# Patient Record
Sex: Male | Born: 1955 | Race: White | Hispanic: No | Smoking: Former smoker
Health system: Southern US, Community
[De-identification: ages and names within clinical notes are randomized; demographics above are authoritative.]

## PROBLEM LIST (undated history)

## (undated) DIAGNOSIS — E785 Hyperlipidemia, unspecified: Secondary | ICD-10-CM

## (undated) DIAGNOSIS — I639 Cerebral infarction, unspecified: Secondary | ICD-10-CM

## (undated) DIAGNOSIS — G4730 Sleep apnea, unspecified: Secondary | ICD-10-CM

## (undated) DIAGNOSIS — I2510 Atherosclerotic heart disease of native coronary artery without angina pectoris: Secondary | ICD-10-CM

## (undated) DIAGNOSIS — R0600 Dyspnea, unspecified: Secondary | ICD-10-CM

## (undated) DIAGNOSIS — I493 Ventricular premature depolarization: Secondary | ICD-10-CM

## (undated) DIAGNOSIS — I6389 Other cerebral infarction: Secondary | ICD-10-CM

## (undated) DIAGNOSIS — E876 Hypokalemia: Secondary | ICD-10-CM

## (undated) DIAGNOSIS — I5042 Chronic combined systolic (congestive) and diastolic (congestive) heart failure: Secondary | ICD-10-CM

## (undated) DIAGNOSIS — I255 Ischemic cardiomyopathy: Secondary | ICD-10-CM

## (undated) DIAGNOSIS — I209 Angina pectoris, unspecified: Secondary | ICD-10-CM

## (undated) DIAGNOSIS — M1990 Unspecified osteoarthritis, unspecified site: Secondary | ICD-10-CM

## (undated) DIAGNOSIS — I10 Essential (primary) hypertension: Secondary | ICD-10-CM

## (undated) HISTORY — DX: Ventricular premature depolarization: I49.3

## (undated) HISTORY — PX: CORONARY STENT PLACEMENT: SHX1402

## (undated) HISTORY — DX: Other cerebral infarction: I63.89

## (undated) HISTORY — DX: Cerebral infarction, unspecified: I63.9

---

## 2002-03-01 ENCOUNTER — Encounter: Payer: Self-pay | Admitting: *Deleted

## 2002-03-02 ENCOUNTER — Inpatient Hospital Stay (HOSPITAL_COMMUNITY): Admission: EM | Admit: 2002-03-02 | Discharge: 2002-03-04 | Payer: Self-pay | Admitting: Emergency Medicine

## 2002-10-28 ENCOUNTER — Inpatient Hospital Stay (HOSPITAL_COMMUNITY): Admission: AD | Admit: 2002-10-28 | Discharge: 2002-11-03 | Payer: Self-pay | Admitting: Cardiology

## 2002-11-01 ENCOUNTER — Encounter: Payer: Self-pay | Admitting: Cardiology

## 2002-11-03 ENCOUNTER — Encounter: Payer: Self-pay | Admitting: Cardiology

## 2004-07-13 ENCOUNTER — Ambulatory Visit: Payer: Self-pay | Admitting: Cardiology

## 2004-09-01 ENCOUNTER — Ambulatory Visit: Payer: Self-pay

## 2005-01-27 ENCOUNTER — Emergency Department: Payer: Self-pay | Admitting: General Practice

## 2005-01-28 ENCOUNTER — Ambulatory Visit: Payer: Self-pay

## 2005-11-26 ENCOUNTER — Inpatient Hospital Stay (HOSPITAL_COMMUNITY): Admission: EM | Admit: 2005-11-26 | Discharge: 2005-11-27 | Payer: Self-pay | Admitting: Emergency Medicine

## 2005-11-26 ENCOUNTER — Ambulatory Visit: Payer: Self-pay | Admitting: Cardiology

## 2005-12-08 ENCOUNTER — Ambulatory Visit: Payer: Self-pay | Admitting: Cardiology

## 2005-12-10 ENCOUNTER — Ambulatory Visit (HOSPITAL_COMMUNITY): Admission: RE | Admit: 2005-12-10 | Discharge: 2005-12-10 | Payer: Self-pay | Admitting: Cardiovascular Disease

## 2006-02-09 ENCOUNTER — Ambulatory Visit: Payer: Self-pay | Admitting: Cardiology

## 2006-12-07 ENCOUNTER — Inpatient Hospital Stay (HOSPITAL_COMMUNITY): Admission: EM | Admit: 2006-12-07 | Discharge: 2006-12-08 | Payer: Self-pay | Admitting: Emergency Medicine

## 2006-12-07 ENCOUNTER — Ambulatory Visit: Payer: Self-pay | Admitting: Internal Medicine

## 2006-12-29 ENCOUNTER — Ambulatory Visit: Payer: Self-pay | Admitting: Cardiology

## 2008-02-27 ENCOUNTER — Inpatient Hospital Stay: Payer: Self-pay | Admitting: Internal Medicine

## 2008-02-27 ENCOUNTER — Ambulatory Visit: Payer: Self-pay | Admitting: Internal Medicine

## 2008-02-27 ENCOUNTER — Other Ambulatory Visit: Payer: Self-pay

## 2010-04-18 ENCOUNTER — Inpatient Hospital Stay: Payer: Self-pay | Admitting: Internal Medicine

## 2010-05-31 ENCOUNTER — Emergency Department: Payer: Self-pay | Admitting: Emergency Medicine

## 2010-11-17 NOTE — H&P (Signed)
NAMECOPELAND, [name]                 ACCOUNT NO.:  [id number]   MEDICAL RECORD NO.:  [id number]           PATIENT TYPE:   LOCATION:                               FACILITY:  [facility]   PHYSICIAN:  Bevelyn Buckles. Bensimhon, MD     DATE OF BIRTH:   DATE OF ADMISSION:  12/07/2006  DATE OF DISCHARGE:                              HISTORY & PHYSICAL   PRIMARY CARE PHYSICIAN:  Dr. Dewaine Oats in [place], [place].   REASON FOR ADMISSION:  Unstable angina.   HISTORY OF PRESENT ILLNESS:  The patient is a 55 year old male with a  history of known coronary artery disease.  He experienced an inferior  wall myocardial infarction in 2003, and was treated with a drug-eluting  stent.  He re-presented in 2004, with a recurrent myocardial infarction.  The right coronary stent had closed.  Unfortunately this was unable to  be opened up.  There were noted to be good left to right collaterals.  He again experienced recurrent angina in May 2007.  He underwent a  cardiac catheterization at that time, showing the left main of 20% and  the LAD with multiple 30%-40% lesions.  There is a large second diagonal  branch with an 80% lesion.  The circumflex had multiple 50%-60% lesions  in the distal AV groove portion.  There were left to right collaterals  perfusing a totally-occluded right coronary artery.  His ejection  fraction was 44% by Myoview in 2006.  He underwent a percutaneous  transluminal coronary angiography and stenting with a drug-eluting stent  to the diagonal branch.   He was doing fairly well until this past Monday, when he developed some  nausea and diaphoresis in the morning.  He ended up staying in bed all  day.  On Tuesday, he had several episodes of severe arm pain.  He took  nitroglycerin, with significant relief; however, this morning he got up  and once again had arm pain and diaphoresis.  He went to Dr. Maree Krabbe  office an got nitroglycerin and it resolved.  He was brought over here.  He  had recurrent arm pain which again responded to nitroglycerin.  He  continues to have just smoldering arm pain.  An electrocardiogram shows  no significant ST-T wave abnormalities.  He has not been taking his  Plavix recently.   REVIEW OF SYSTEMS:  He notes heavy snoring and poor sleep.  Also has  gout.  Denies any lower extremity edema.  No orthopnea or PND.  No  bright red blood per rectum or melena.  The remainder of the review of  systems is negative except for the HPI.   PAST MEDICAL HISTORY:  1. Coronary artery disease with      a.     Status post previous inferior wall myocardial infarction as       described in the history of present illness.      b.     Status post percutaneous transluminal coronary angiography       and stenting with a drug-eluting stent to the diagonal branch in  May 2007.  2. Hypertension.  3. Hyperlipidemia.  4. Obesity.  5. Tobacco use, ongoing.  6. Gout.  7. History of noncompliance.   CURRENT MEDICATIONS:  1. Aspirin 325 mg q.d.  2. Carvedilol 6.25 mg q.d.   ALLERGIES:  CODEINE, VICODIN AND PENICILLIN.  HE ALSO HAS A CONTRAST  ALLERGY.   SOCIAL HISTORY:  He is married.  He has two kids.  He is a Statistician.  Currently smokes one pack of cigarettes a day, but previously smoked  three packs a day.  He has a history of alcohol abuse, but is no longer  drinking.   FAMILY HISTORY:  Significant for coronary artery disease.  His dad died  in his 84s of a myocardial infarction.  Mother is currently alive at age  64 with multiple problems, including  coronary artery disease.   PHYSICAL EXAMINATION:  GENERAL:  He is in mild distress sitting on the  examination table.  VITAL SIGNS:  Respirations unlabored.  Blood pressure 160/90, heart rate  60.  HEENT:  Normal.  NECK:  Is thick.  Unable to assess jugular venous distention.  Carotids  are 2+ bilaterally without any bruits.  There is no lymphadenopathy or  thyromegaly.  CARDIAC:  PMI is not  palpable.  There is a regular rate and rhythm with  distant heart sounds.  No obvious murmurs.  LUNGS:  Clear.  No wheezes or rales.  ABDOMEN:  Obese, nontender, non-distended.  Unable to palpate any  hepatosplenomegaly or bruits or masses.  Good bowel sounds.  EXTREMITIES:  Warm, with no clubbing, cyanosis or edema.  DP pulses are  2+ bilaterally.  There is no rash.  NEUROLOGIC:  He is alert and oriented x3.  Cranial nerves II-XII  are  intact.  He moves all four extremities without difficulty.   Electrocardiogram shows normal sinus rhythm at a rate of 60.  He does  have mild T-wave inversion in lead I and aVL, which was not on his  previous electrocardiogram of October 24, 2006.   ASSESSMENT/PLAN:  Unstable angina:  The patient's symptoms are very  concerning for unstable angina.  We have given him some nitroglycerin  and put him on oxygen and contacted [facility], and will have him  transported to [facility] for a cardiac  catheterization later today.  We have discussed the risks and benefits  of the cardiac catheterization and he agrees to proceed.  Will give him  300 mg of Plavix prior to transport.      Bevelyn Buckles. Bensimhon, MD  Electronically Signed     DRB/MEDQ  D:  12/07/2006  T:  12/07/2006  Job:  [id number]   cc:   Dewaine Oats, [name]

## 2010-11-17 NOTE — Assessment & Plan Note (Signed)
[facility] OFFICE NOTE   Glen, [name]                          MRN:          [id number]  DATE:12/29/2006                            DOB:          October 01, 1955    [name] returns today after being discharged from the hospital.  He was  admitted on December 07, 2006, by Dr. Gala Romney after presenting with what  sounded like unstable angina.   He ruled out for myocardial infarction.   His cardiac catheterization showed inferior akinesia from his previous  inferior infarct which he had in 2003.  He had a patent stent and a  large diagonal off the LAD.  His circumflex was a moderate sized vessel  with no significant disease.  His right coronary artery was occluded as  before.  Distal vessel was large and collateralized from the left.  His  EF was 45%.   Lisinopril was added for some mild to moderate left ventricular systolic  dysfunction.  He was also sent home on carvedilol 6.25 mg b.i.d.,  aspirin 325 mg a day.  He cannot tolerate Crestor because of aches.   He wants to know if there is anything else he can take.  I suggested  Pravastatin 40, which I prescribed today.   He has had no further chest discomfort.   PHYSICAL EXAMINATION:  VITAL SIGNS:  Blood pressure 138/88, pulse 66 and  regular, weight 246.  HEENT:  Unchanged.  Carotids were full.  No bruits.  No JVD.  LUNGS:  Clear.  HEART:  Nondisplaced PMI.  There is a soft S1, S2.  No gallop or murmur.  ABDOMEN:  Protuberant with good bowel sounds.  Organomegaly was hard to  assess.  EXTREMITIES:  No edema.  Pulses are intact.  Catheterization site is  stable.   ASSESSMENT/PLAN:  I had a long talk with [name] today about importance  of a Stain.  I have placed him on Pravastatin 40 mg nightly.  Will check  lipids and LFTs in 6 weeks.  If we can get his LDL below 100, I will be  happy.  Crestor is a drug intolerance.   If he is doing well, I will see him  back in a year.     Thomas C. Daleen Squibb, MD, [facility]  Electronically Signed    TCW/MedQ  DD: 12/29/2006  DT: 12/29/2006  Job #: [id number]   cc:   Dewaine Oats

## 2010-11-17 NOTE — Cardiovascular Report (Signed)
NAMENATASHA, PAULSON                 ACCOUNT NO.:  [id number]   MEDICAL RECORD NO.:  [id number]          PATIENT TYPE:  INP   LOCATION:  4733                         FACILITY:  [facility]   PHYSICIAN:  Salvadore Farber, MD  DATE OF BIRTH:  Dec 15, 1955   DATE OF PROCEDURE:  12/07/2006  DATE OF DISCHARGE:                            CARDIAC CATHETERIZATION   PROCEDURES:  1. Left heart catheterization.  2. Left ventriculography.  3. Coronary angiography.   INDICATIONS:  Mr. Simonis is a 55 year old gentleman with coronary disease.  He is status post inferior myocardial infarction in 2003 and then a  repeat anterior myocardial infarction in 2004.  The RCA has been  chronically occluded since then with left-to-right collaterals.  He has  a Taxus drug-eluting stent in the proximal portion of the first  diagonal, which was placed in May 2007.   Mr. Mages now presents with 2 days of left arm pain, nausea and  diaphoresis, which has been coming and going.  It improved with  nitroglycerin.  Electrocardiogram has inferolateral T-wave inversions.  He was transferred from the office in [place] to the catheterization  lab for cardiac catheterization.   PROCEDURAL TECHNIQUE:  Informed consent was obtained.  Under 1%  lidocaine local anesthesia, a 5-French sheath was placed in the right  common femoral artery using THE modified Seldinger technique.  Diagnostic angiography and ventriculography were performed using JL-4,  JR-4, and pigtail catheters.  The patient tolerated the procedure well  and was transferred to the holding room in stable condition.  Sheaths  will be removed there.   COMPLICATIONS:  None.   FINDINGS:  1. LV:  167/21/29.  EF approximately 45% with inferior akinesis.  2. No aortic stenosis or mitral regurgitation.  3. Left main:  Angiographically normal.  4. LAD:  Moderate-sized vessel giving rise to a single large diagonal.      The diagonal has a previously-placed stent in its  proximal segment.      There is no in-stent restenosis.  5. Circumflex:  Moderate-sized vessel giving rise to two marginals.      It has only minor luminal irregularities.  6. RCA:  The vessel is occluded proximally.  The distal vessel is      fairly large and is collateralized from the left.   IMPRESSION/PLAN:  No change compared with 2007.  The right coronary  artery remains collateralized from the left.  However, it is not clear  that any this territory is viable.  The diagonal stent remains widely  patent.  Ejection fraction is approximately 45%.   Given his impaired left ventricular systolic function, will add ACE  inhibitor.      Salvadore Farber, MD  Electronically Signed     WED/MEDQ  D:  12/07/2006  T:  12/08/2006  Job:  [id number]   cc:   Bevelyn Buckles. Bensimhon, MD  Dewaine Oats

## 2010-11-17 NOTE — Discharge Summary (Signed)
[name], [name]                 ACCOUNT NO.:  [id number]   MEDICAL RECORD NO.:  [id number]          PATIENT TYPE:  INP   LOCATION:  4733                         FACILITY:  [facility]   PHYSICIAN:  [name], MDDATE OF BIRTH:  Aug 20, 1955   DATE OF ADMISSION:  12/07/2006  DATE OF DISCHARGE:  12/08/2006                               DISCHARGE SUMMARY   OTHER PHYSICIANS:  Primary cardiologist Dr. Valera [name].  Primary care [employer], Dr. Dewaine [name] in [place].   DISCHARGE DIAGNOSIS:  Chest pain.   SECONDARY DIAGNOSES:  1. Coronary artery disease.      a.     Status post previous inferior wall myocardial infarction in       2003 with drug-eluting stent placement to the right coronary       artery.      b.     Status post percutaneous transluminal coronary angioplasty       and drug-eluting stent placement to the diagonal branch in May       2007.  At that time the right coronary artery was noted be       occluded with left-to-right collaterals.  2. Hypertension.  3. Hyperlipidemia.  4. Obesity.  5. Ongoing tobacco abuse.  6. Gout.  7. History of noncompliance.  8. Question metabolic syndrome.   ALLERGIES:  CODEINE, VICODIN, PENICILLIN, CONTRAST.   PROCEDURE:  Left heart rate catheterization.   HISTORY OF PRESENT ILLNESS:  A 55 year old Caucasian male with the above  problem list who was in his usual state of health until earlier this  week when he developed nausea and diaphoresis in the morning.  He felt  ill or generalized malaise for a good part of the day and then the  following day developed several episodes of severe arm pain unrelieved  by nitroglycerin.  On the morning of admission, December 07, 2006, he had  recurrent arm pain with diaphoresis resolved by nitroglycerin in Dr.  Maree [name] office.  He was then seen by Dr. Arvilla [name] in clinic and  ECG showed no acute changes.  He was treated with Plavix in clinic and  transferred to [facility] for further  evaluation.   HOSPITAL COURSE:  The patient underwent left heart cardiac  catheterization on December 07, 2006 revealing an occluded right coronary  artery which was old with left-to-right collaterals.  The stent in the  diagonal was widely patent and otherwise he had nonobstructive disease.  His EF was 45% with inferior akinesis.  Given his low EF with wall  motion abnormalities he was initiated on ACE inhibitor therapy and his  Coreg has been maintained.  He had been reinitiated on statin therapy  which he previously came off of because his prescription ran out.  He  has been counseled on the importance of smoking cessation and will be  discharged home today in satisfactory condition.   DISCHARGE LABORATORY:  Hemoglobin 14.1, hematocrit 40.5, WBC 13.6,  platelets 221,000.  Sodium 137, potassium 3.9, chloride 105, CO2 27, BUN  12, creatinine 0.91, glucose 193, calcium 8.8.  TSH pending.  DISPOSITION:  The patient is being discharged home today in good  condition.   FOLLOW-UP APPOINTMENTS:  He is to follow-up with Dr. Valera [name] in our  [facility] on December 29, 2006 at 10:00 a.m..  He will follow up Dr.  Dewaine [name] as previously scheduled.   DISCHARGE MEDICATIONS:  1. Aspirin 81 mg daily.  2. Crestor 10 mg daily.  3. Lisinopril 10 mg daily.  4. Nitroglycerin 0.4 mg sublingual p.r.n. chest pain.  5. Coreg 6.25 mg two times a day.   OUTSTANDING LAB STUDIES:  TSH is pending.   Duration discharge encounter 40 minutes including physician time.      [name], ANP      Glen Buckles. Bensimhon, MD  Electronically Signed    CB/MEDQ  D:  12/08/2006  T:  12/08/2006  Job:  [id number]   cc:   [name]

## 2010-11-20 NOTE — Cardiovascular Report (Signed)
NAMELETICIA, MCDIARMID NO.:  [id number]   MEDICAL RECORD NO.:  [id number]          PATIENT TYPE:  INP   LOCATION:  1825                         FACILITY:  [facility]   PHYSICIAN:  Charlies Constable, [name] [facility] DATE OF BIRTH:  08-Mar-1956   DATE OF PROCEDURE:  11/26/2005  DATE OF DISCHARGE:                              CARDIAC CATHETERIZATION   CLINICAL HISTORY:  Mr. Prevo is 55 years old and has known coronary disease.  He is a Naval architect.  He had a chronic total occlusion opened in 2003 but  subsequently occluded this and had an unsuccessful attempt repeat  intervention.  He was admitted today with a 3-day history of progressive  chest pain thought to represent unstable angina.  He was studied by Dr.  Eden Emms today and found to have a tight lesion in the large diagonal branch  of the LAD.  The right coronary was chronically occluded.  His LV function  showed inferior wall akinesis.  We elected to do an intervention on the  diagonal branch of the LAD.   PROCEDURE:  The procedure was performed via the right femoral artery using a  6-French Q-4 guiding catheter with side holes.  We had initially started  with a Prowater wire but then switched to a PT-2 light support wire because  the lesion was somewhat difficult to cross.  We were able to cross the  lesion, and we predilated with a 2.25 x 20 mm Maverick, performing two  inflations up to 10 atmospheres for 30 seconds.  We then deployed a 2.75 x  24 mm TAXUS stent, deploying this with one inflation of 14 atmospheres for  30 seconds.  We then post dilated with a 3.0 x 20 mm Quantum Maverick,  performing two inflations up to 16 atmospheres for 30 seconds.  Final  diagnostic study was then performed through the guiding catheter.  The  patient tolerated the procedure well and left the laboratory in satisfactory  condition.   RESULTS:  Initially stenosis in the proximal portion of the large diagonal  branch was estimated at 90%.   There was a side branch located at the lesion,  but this was not too large a branch.  Following stenting, the stenosis  improved from 90% to 0%.   CONCLUSION:  Successful PCI of the diagonal branch stenosis using a TAXUS  drug-eluting stent with improvement in narrowing from 90% to 0%.   DISPOSITION:  The patient returned to the recovery room for further  observation.  Will plan discharge tomorrow.  The patient is to remain on  long-term Plavix.           ______________________________  Charlies Constable, [name] [facility]     BB/MEDQ  D:  11/26/2005  T:  11/27/2005  Job:  [id number]   cc:   Vicenta Dunning, [name], Lum Babe C. Wall, [name]  [address]  [address]  [place]  [place] [address]   Charlton Haws, [name]  [address]  [address]  [place]  [place] [address]   Cardiopulmonary Lab

## 2010-11-20 NOTE — Cardiovascular Report (Signed)
   [name], [name]                             ACCOUNT NO.:  [id number]   MEDICAL RECORD NO.:  [id number]                   PATIENT TYPE:  INP   LOCATION:  2901                                 FACILITY:  [facility]   PHYSICIAN:  Noralyn Pick. Eden Emms, [name] [facility]           DATE OF BIRTH:  1956/05/07   DATE OF PROCEDURE:  DATE OF DISCHARGE:                              CARDIAC CATHETERIZATION   PROCEDURE:  Coronary arteriography.   INDICATION:  Out-of-hospital inferior wall myocardial infarction in the last  36-48 hours.   DESCRIPTION OF PROCEDURE:  Standard catheterization was done from the right  femoral artery.   RESULTS:  Left main coronary artery had 20% discrete stenosis.   Left anterior descending artery had 30% multiple discrete lesions  proximally. There was diffuse 50-60% disease distally. The first diagonal  branch had a 70% tubular lesion.   The circumflex coronary artery had 50% multiple discrete lesions in the AV  groove branch.   The right coronary artery was 100% occluded at its mid vessel. The occlusion  was just after an RV branch. However, there was excellent left to right  collaterals mostly to the distal LAD and septal perforators. These filled  the posterolateral branch PDA and the distal right coronary artery.   RIGHT ANTERIOR OBLIQUE VENTRICULOGRAPHY:  RAO ventriculography revealed  inferobasilar hypokinesis.  Ejection fraction 50-55%. There was no MR.   Aortic pressure was 123/71, LV pressure was 128/24.   IMPRESSION/PLAN:  The films will be reviewed with Dr. Juanda Chance. However, since  the anatomy is favorable and there is only a short segment of unvisualized  native right coronary artery, I suspect he will want to intervene and open  the total occlusion of the right coronary artery. The patient has not had  much left ventricular dysfunction due to the good collateralization and  again, I suspect Dr. Juanda Chance will want to intervene the internal right  coronary  artery.                                                        Noralyn Pick. Eden Emms, [name] [facility]    PCN/MEDQ  D:  03/02/2002  T:  03/04/2002  Job:  [id number]   cc:   _________ Royetta Crochet, [name]  Google C. Wall, [name] [facility]

## 2010-11-20 NOTE — Cardiovascular Report (Signed)
[name], [name]                             ACCOUNT NO.:  [id number]   MEDICAL RECORD NO.:  [id number]                   PATIENT TYPE:  INP   LOCATION:  2931                                 FACILITY:  [facility]   PHYSICIAN:  Salvadore Farber, [name]             DATE OF BIRTH:  1955/10/22   DATE OF PROCEDURE:  DATE OF DISCHARGE:                              CARDIAC CATHETERIZATION   PROCEDURE:  Left heart catheterization, left ventriculography, coronary  angiography, unsuccessful percutaneous intervention of the right coronary  artery.   INDICATIONS FOR PROCEDURE:  The patient is a 55 year old gentleman status  post stenting of his RCA in the setting of myocardial infarction in August  2003.  He now re-presents with acute myocardial infarction, this time  complicated by ventricular fibrillatory arrest.  He was seen at [facility] where he was treated with aspirin, Plavix, heparin,  ReoPro, amiodarone, and Lopressor, and transferred for cardiac  catheterization with an eye to primary angioplasty.   En route, the patient described a prior contrast allergy.  He stated that  this had occurred during his prior catheterization at [facility].  This was  documented in neither the discharge summary nor the catheterization report;  nonetheless, he was premedicated with 125 mg of Solu-Medrol, 50 mg of  Benadryl, and 50 mg of Zantac.   PROCEDURAL TECHNIQUE:  Informed consent was obtained.  Under 1% lidocaine  local anesthesia, a 7-French sheath was placed in the right femoral artery  using the modified Seldinger technique.  Diagnostic cineangiography and  ventriculography were performed using JL4, JR4, and pigtail catheters.  The  case then proceeded to intervention.   Additional heparin was given to achieve an ACT of greater than 200 seconds.  ReoPro was continued.  A 7-French JR4 guide was advanced over a wire and  engaged in the ostium of the right coronary artery.  There  was difficulty  passing a wire beyond the stenosis.  A BMW wire could not be passed.  Eventually, a luge wire was passed into the distal PLV; however, I was  unable to pass a 3.0-mm Quantum beyond the proximal margin of the previously  placed stent.  It felt as if the wire were under a stent strut; therefore, a  luge wire was passed, this time, into the PDA.  This passed with ease.  A  2.0 x 9-mm Quantum balloon was then advanced over the wire.  This was able  to be advanced into the distal vessel; however, again, it hung up at the  proximal marginal stent as if the wire were under a stent strut.  TIMI-2  flow was transiently established complicated by transient bradycardia which  responded to atropine.  Because both wires were under stent struts, they  were withdrawn.  Subsequent attempts using multiple wires including PT  Graphix, Whisper, Cross-It 100, Cross-It 200 wires were all unsuccessful in  remaining free of the stent strut.  I attempted to prolapse several wires  across the proximal margin of the stent but was unable to do so.  They would  simply not enter the proximal portion of the stent.  As no equipment could  be passed beyond the proximal margin of the stent, the procedure was  abandoned.  Final angiogram demonstrated recurrent complete occlusion with  TIMI-0 flow.    IMPRESSION AND RECOMMENDATIONS:  Unsuccessful percutaneous intervention of  the culprit lesion of the right coronary artery.  Will plan medical therapy  for the myocardial infarction with aspirin, beta blocker (not Lopressor),  statin, and ACE inhibitor.  Intravenous nitroglycerin will be continued to  optimize collateral flow.  Careful attention will be made to secondary  prevention with risk factor modification.                                                Salvadore Farber, [name]    WED/MEDQ  D:  10/28/2002  T:  10/30/2002  Job:  [id number]   cc:   Thomas C. Wall, [name]   Dewaine Oats  [address]  [place]  [place] [address]  Fax: [phone]

## 2010-11-20 NOTE — Cardiovascular Report (Signed)
[name], [name]                 ACCOUNT NO.:  [id number]   MEDICAL RECORD NO.:  [id number]          PATIENT TYPE:  INP   LOCATION:  1825                         FACILITY:  [facility]   PHYSICIAN:  Charlton Haws, [name]     DATE OF BIRTH:  [birth year]   DATE OF PROCEDURE:  11/26/2005  DATE OF DISCHARGE:                              CARDIAC CATHETERIZATION   PROCEDURE:  Arteriography.   INDICATIONS:  Recurrent angina.   The patient is status post previous stenting of the right coronary artery  for total occlusion. Unfortunately, this stent reoccluded and was unable to  be opened.  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   Catheterization with 6-French catheter from right femoral artery.   Left main coronary artery had 20% distal stenosis.   Left anterior descending artery had 30-40% multiple discrete lesions in the  proximal and mid vessel.   There is a large second diagonal branch with an 80% lesion. The lesion  occurred right at the bifurcation point   This lesion appears to have progressed since previous.   Circumflex coronary was large but not dominant.   The proximal circumflex coronary artery was normal.  The distal AV groove  branch had 50-60% multiple discrete lesions.   There was some left-to-right collaterals to the right coronary artery which  were not as well established as previous   Right coronary was subtotally occluded proximally at the previous stent  site.   IMPRESSION:  Films reviewed with Dr. Juanda Chance. He will proceed with  angioplasty and stenting of the second diagonal branch.           ______________________________  Charlton Haws, [name]     PN/MEDQ  D:  11/26/2005  T:  11/27/2005  Job:  [id number]

## 2010-11-20 NOTE — Cardiovascular Report (Signed)
[name], [name]                             ACCOUNT NO.:  [id number]   MEDICAL RECORD NO.:  [id number]                   PATIENT TYPE:  INP   LOCATION:  2901                                 FACILITY:  [facility]   PHYSICIAN:  Everardo Beals. Juanda Chance, [name] [facility]           DATE OF BIRTH:  [birth year]   DATE OF PROCEDURE:  DATE OF DISCHARGE:                              CARDIAC CATHETERIZATION   PROCEDURE PERFORMED:  Cardiac catheterization.   CLINICAL HISTORY:  The patient is 55 years old and has no prior history of  known heart disease and was admitted with chest pain and ECGs and enzymes  consistent with an inferior MI. The pain resolved by the time he arrived and  so he was not taken to the lab urgently. He was studied earlier by Dr.  Eden Emms today and found to have a totally occluded right coronary artery with  fairly good collateral flow. His peak CKs and MBs were 694/73. We made a  decision to proceed with intervention of the right coronary artery.   DESCRIPTION OF PROCEDURE:  The procedure was performed via the right femoral  artery using an arterial sheath and 6 [place] JR4 guiding catheter with side  holes. The patient was given weight-adjusted heparin to prolong the ACT to  greater than 200 seconds and had been on an Integrilin drip. We first tried  to cross the totally occluded right coronary artery with a Hi-Torque Floppy  wire. This was unsuccessful and ___________ Graphix PT and we were able to  cross the lesion. We dilated with a balloon but this did not establish flow.  For this reason, we passed a 2.5 x 20 mm OpenSail across the lesion and  removed the wire and injected contrast distally to document we were in the  lumen. Once we documented we were in the lumen, then we replaced the wire  and inflated the balloon for two inflations up to 7 atmospheres for 23  seconds.  We then deployed a 3.0 x 33 mm Cypher stent deploying this with  one inflation up to 15 atmospheres for 56  seconds. We then post-dilated with  a 3.5 x 20 mm Quantum Maverick performing three inflations up to 14  atmospheres for 30 seconds. Repeat diagnostic studies were then performed  through the guiding catheter. The patient tolerated the procedure well and  left the laboratory in satisfactory condition.   RESULTS:  Initially the right coronary artery was totally occluded in its  proximal to midportion. Following stenting, this improved to 10% and the  flow improved from TIMI-0 to TIMI-3 flow.   CONCLUSION:  Successful stenting of the recently totally occluded mid right  coronary artery with improvement in percent diameter narrowing to  improvement in percent diameter narrowing from 100% to 10% and improvement  in the flow from TIMI-0 to TIMI-3 flow.   DISPOSITION:  The patient was returned to the postangioplasty  unit for  further observation.                                                    [name], [name] [facility]    BRB/MEDQ  D:  03/02/2002  T:  03/04/2002  Job:  [id number]

## 2010-11-20 NOTE — H&P (Signed)
NAMEBABACAR, HAYCRAFT                 ACCOUNT NO.:  [id number]   MEDICAL RECORD NO.:  [id number]          PATIENT TYPE:  INP   LOCATION:  1825                         FACILITY:  [facility]   PHYSICIAN:  Thomas C. Wall, [name]   DATE OF BIRTH:  12/29/1955   DATE OF ADMISSION:  11/26/2005  DATE OF DISCHARGE:                                HISTORY & PHYSICAL   ADDENDUM:  Mr. Vanvranken was to be admitted from our office to the hospital  today.  He was supposed to go by EMS transport.  The patient asked to use  the bathroom and wanted to go downstairs to lock up his truck.  I asked that  we have one of our assistants go with him to make sure that he was okay when  he went to go lock up his truck; however, he left the building without  assistance and left the premises.  We tried to reach him by telephone but  were unsuccessful.  I did call the emergency room later and learned that the  patient had shown up to the emergency room.  His paperwork was sent over to  the hospital, and is actually on his way to the cath lab at the time of this  dictation.      Tereso Newcomer, P.A.      Thomas C. Wall, [name]  Electronically Signed    SW/MEDQ  D:  11/26/2005  T:  11/26/2005  Job:  [id number]

## 2010-11-20 NOTE — H&P (Signed)
NAMEJACQUESE, HACKMAN                 ACCOUNT NO.:  [id number]   MEDICAL RECORD NO.:  [id number]          PATIENT TYPE:  EMS   LOCATION:  MAJO                         FACILITY:  [facility]   PHYSICIAN:  Thomas C. Wall, [name]   DATE OF BIRTH:  August 25, 1955   DATE OF ADMISSION:  11/26/2005  DATE OF DISCHARGE:                                HISTORY & PHYSICAL   PRIMARY CARE PHYSICIAN:  Dr[place] Dewaine [place]   PRIMARY CARDIOLOGIST:  Dr. Valera Castle   CHIEF COMPLAINT:  Left arm pain, headaches, dizziness, shortness of breath.   HISTORY OF PRESENT ILLNESS:  [name] is a very pleasant 55 year old male  patient with a history of coronary disease status post out-of-hospital  inferior MI in 2003 treated with a CYPHER stent to the RCA and inferior ST  elevation myocardial infarction in April of 2004 complicated by ventricular  fibrillation arrest secondary to restenosis of the RCA.  Attempt at PCI of  the RCA in 2004 was unsuccessful.  Medical therapy was recommended.  The  patient stopped his cardiac medications probably a year or two ago.  He,  over the last three days, has noted some left arm aching.  This is similar  to his previous myocardial infarction pain.  Denies any chest pain.  He did  not have any chest pain with his myocardial infarction.  He has had dyspnea  exertion with just walking up hills or going up steps.  This is new for him.  He has been a little more sweaty than usual.  Denies any nausea or vomiting.  Denies any syncope but he has been lightheaded, especially with certain  changes in head positioning.  He has had some headaches as well.  He did  have a couple of episodes of arm pain in the office today.  We plan to admit  him to the hospital for further evaluation and cardiac catheterization.   PAST MEDICAL HISTORY:  As noted above, is significant for coronary artery  disease.  He had an inferior MI in 2003 treated with a drug-eluting stent to  the RCA and  then re-occlusion of the RCA resulting in an ST elevation  myocardial infarction and ventricular fibrillation arrest.  PCI was  unsuccessful to the RCA.  Residual CAD at time of his catheterization in  2004 showed a 40% diagonal lesion, distal LAD lesion of 50%, circumflex  lesion of 50%.  He had a non-ischemic Myoview in July 2006.  His EF has been  recorded as 44% by Myoview in 2006.  EF by echocardiogram in 2004 was 55-  65%.  He has a history of hypertension, hypercholesterolemia, gout.  He has  an IV dye allergy.   MEDICATIONS:  Chantix 1 mg two tablets a day.   ALLERGIES:  PENICILLIN, CODEINE, IV DYE, and ACE INHIBITORS.   SOCIAL HISTORY:  The patient is married.  He is a Statistician by trade.  He  has smoked cigarettes for about 30 years at three packs per day for a 90-  pack-year history.  He is down  to just a few cigarettes a day now.   FAMILY HISTORY:  Significant for coronary artery disease.  His dad died in  his 3s of a myocardial infarction.   REVIEW OF SYSTEMS:  Please see HPI.  Denies any fevers, chills, melena.  He  has had some bright red blood per rectum from his hemorrhoids.  This has not  changed.  Denies any claudication.  Denies any symptoms of amaurosis fugax  or TIAs.  He has had headaches noted pretty much frontal and posterior.  Positive blurry vision.  Rest of review of systems are negative.   PHYSICAL EXAMINATION:  GENERAL:  Well-nourished, well-developed __________.  VITAL SIGNS:  Blood pressure 132/82, pulse 76, weight 207 pounds.  HEENT:  Head normocephalic, atraumatic.  Eyes:  PERRLA.  EOMI.  Sclerae  clear.  Oropharynx pink without exudate.  NECK:  Without lymphadenopathy.  ENDOCRINE:  Without thyromegaly.  Carotids without bruits bilaterally.  CARDIAC:  S1, S2.  Regular rate and rhythm with 1/6 systolic ejection murmur  heard best right upper sternal border.  LUNGS:  Clear to auscultation bilaterally without wheezing, rhonchi, or  rales.   ABDOMEN:  Soft, nontender.  Normoactive bowel sounds.  No organomegaly.  EXTREMITIES:  Without edema.  Femoral pulses are 2+ bilaterally without  bruits.  Dorsalis pedis and posterior tibialis pulses 2+ bilaterally without  bruits.  NEUROLOGIC:  Nonfocal.  SKIN:  Warm and dry.   Electrocardiogram reveals sinus rhythm with a heart rate of 70, left axis  deviation, inferior Q-waves, T-wave inversions in 1 and aVL, in V5 and 6  which is new since January of 2006.   IMPRESSION:  1.  Unstable angina pectoris.  2.  Coronary artery disease status post inferior myocardial infarction 2003      treated with drug-eluting stent to the right coronary artery with      recurrent ST elevation myocardial infarction 2004 with unsuccessful PCI      of the right coronary artery.      1.  Residual coronary artery disease as noted above.  3.  Ejection fraction 44% by Myoview 2006.  4.  Untreated hypertension.  5.  Untreated dyslipidemia.  6.  Gout.  7.  IV dye allergy.  8.  Noncompliance.  9.  Tobacco abuse.  10. Family history of coronary disease.   PLAN:  The patient was also seen by Dr. Daleen Squibb.  We plan to admit him to the  hospital and treat him with aspirin, heparin, beta blocker, nitroglycerin,  and initiate a Statin.  We have alerted the catheterization laboratory and  he will be taken today.  Will check his cardiac enzymes and pre treat him  for his IV dye allergy.      Tereso Newcomer, P.A.      Thomas C. Wall, [name]  Electronically Signed    SW/MEDQ  D:  11/26/2005  T:  11/26/2005  Job:  [id number]   cc:   Dewaine Oats  Fax: [phone]

## 2010-11-20 NOTE — H&P (Signed)
[name], [name]                             ACCOUNT NO.:  [id number]   MEDICAL RECORD NO.:  [id number]                   PATIENT TYPE:  EMS   LOCATION:  MAJO                                 FACILITY:  [facility]   PHYSICIAN:  Thomas C. Wall, [name] [facility]            DATE OF BIRTH:  [birth year]   DATE OF ADMISSION:  03/01/2002  DATE OF DISCHARGE:                                HISTORY & PHYSICAL   CHIEF COMPLAINT:  Chest pressure and aching and numbness and tingling in my  arm yesterday evening and again this morning.  I went to [facility] but decided to leave and come here for treatment.   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  He went home and took an aspirin.  He laid in bed most of the evening and  continued to hurt.  He said he was afraid to fall asleep.   He awoke this morning feeling better.  On the way to work, he got sweaty,  clammy, and had the chest pressure once again.  He admitted himself to  [facility] after visiting Urgent Care and was found to have  ST changes inferiorly, as well as T-wave inversion.  His initial CK was 694  with an MB of 72.6 and troponin-I of 15.8.  Basic metabolic panel and  hemoglobin and platelet count were normal except for a glucose of 116, which  was nonfasting.   He received IV nitroglycerin, aspirin, Lovenox, and Integrilin at [facility].  He has not had any Integrilin in about seven hours.   He now presents to the emergency room at [facility] with the same  complaints.  When he arrived, he was having chest discomfort that was  partially relieved with nitroglycerin.  He is now almost pain free on IV  nitroglycerin and heparin.  We  are getting ready to start Integrilin.   ALLERGIES:  He is intolerant to CODEINE and PENICILLIN.   MEDICATIONS:  His only medication prior to admission was Indocin p.r.n. for  gout.   PAST MEDICAL HISTORY:  His only other medical problem is gout.   PAST SURGICAL HISTORY:  He has had some right eye surgery in the past.   FAMILY HISTORY:  Positive for coronary disease in his father.   SOCIAL HISTORY:  He smokes two packs a day and has for years.  He does not  drink or use drugs.   REVIEW OF SYSTEMS:  Review of systems, other than the HPI, is  noncontributory.   PHYSICAL EXAMINATION:  VITAL SIGNS:  His blood pressure was 110/70, his  pulse is  74 and regular.  O2 saturations are normal on 2 L of nasal cannula.  His respiratory rate is 20 and unlabored.  GENERAL:  He is anxious appearing.  SKIN:  Warm and dry.  HEENT:  Unremarkable.  NECK:  Carotid upstrokes are equal bilaterally without bruits.  There is no  JVD.  Thyroid is not enlarged.  LUNGS:  Clear to auscultation and percussion.  HEART:  Regular rate and rhythm without gallop or murmur.  ABDOMEN:  Protuberant.  Good bowel sounds.  There is no epigastric bruit.  There is no hepatomegaly.  EXTREMITIES:  No cyanosis, clubbing, or edema.  Pulses were brisk  bilaterally.  NEUROLOGIC:  Grossly intact.   LABORATORY DATA:  Chest x-ray shows no acute cardiopulmonary disease.  EKG  here shows small Q's in III and aVF with ST segment changes with T-wave  inversion.  These are identical to those at [facility].   ASSESSMENT:  1. Out of hospital inferior wall infarction.  2. Heavy tobacco use.  3. Obesity.  4. Unknown lipid status.  5. Family history of coronary disease.  6. History of gout.  7. CODEINE and PENICILLIN allergies.   PLAN:  1. Admit to the coronary care unit or transitional care unit.  2. Intravenous nitroglycerin, heparin, and Integrilin.  3. Beta-blockers.  4. Aspirin.  5. Serial CPK's and  MB's.  6. TSH and lipid panel.  7. Cardiac catheterization tomorrow.   I have discussed this with he and his wife.  They understand the plans and  agree to proceed.                                                 Thomas C. Daleen Squibb, [name] [facility]    TCW/MEDQ  D:  03/02/2002  T:  03/04/2002  Job:  [id number]   cc:   Dewaine Oats

## 2010-11-20 NOTE — Discharge Summary (Signed)
NAMEWENDY, [name]                             ACCOUNT NO.:  [id number]   MEDICAL RECORD NO.:  [id number]                   PATIENT TYPE:  INP   LOCATION:  2034                                 FACILITY:  [facility]   PHYSICIAN:  Jesse Sans. Wall, [name] [facility]            DATE OF BIRTH:  October 20, 1955   DATE OF ADMISSION:  03/02/2002  DATE OF DISCHARGE:  03/04/2002                           DISCHARGE SUMMARY - REFERRING   PROCEDURE:  1. Cardiac catheterization.  2. Coronary arteriogram.  3. Left ventriculogram.  4. Percutaneous transluminal coronary angioplasty and stent of one vessel.   HOSPITAL COURSE:  The patient is a 55 year old male with no known history of  coronary artery disease who went to his family physician's office for  substernal chest pain on March 01, 2002.  Dr. Arlana Pouch felt that the patient  was having symptoms consistent with unstable anginal pain and the patient  was transported to [facility].  There his enzymes were  elevated consistent with MI and his EKG showed inferior T wave changes.  It  was felt that he had had an out-of-hospital MI.  He was admitted to [facility]; however, the patient left AMA on March 01, 2002.  He presented to  the [facility] Emergency Room on March 01, 2002, at approximately 11 p.m.  There he was seen by [facility] Card[facility] and admitted for further evaluation  and treatment.   He was admitted to CCU and scheduled for cardiac catheterization which was  performed on March 02, 2002.  A cardiac catheterization showed a left main  20% stenosis, an LAD 30% proximal and 50-60% diffuse distal stenosis.  The  first diagonal had a 70% lesion.  The circumflex had a 50% stenosis in the  AV groove.  The RCA was totaled in the mid portion with left-to-right  collaterals.  His left ventriculogram showed inferobasal hypokinesis with an  EF of 50-55% and no MR.  The situation was discussed between Dr. Eden Emms and  Dr. Juanda Chance and it was  decided that percutaneous intervention on the RCA was  indicated.   The patient had PTCA and CYPHER stent to his RCA reducing the stenosis from  100% to less than 10% with TIMI-3 flow.  He tolerated the procedure well and  the sheath was removed without difficulty.   He had no further episodes of chest pain during the course of his hospital  stay and was ambulating well.  He was seen by cardiac rehab for risk factor  reduction as well as the use of nitroglycerin and calling 911 were among the  things discussed.  The patient stated that he would start a walking program  once he went home.   The patient also has a long history of tobacco use and smoking cessation was  discussed.  The patient appeared motivated to quit and was started on  Wellbutrin to assist in this.  He stated that because he was self-pay,  outpatient smoking cessation programs were not an option for him.   The patient had some financial issues relating to medications and a case  management consult was called.  A Plavix program was instituted and he was  also to get some medications from the hospital itself.  He is to follow up  with a clinic in [place] to see if any other medication help is available  to him.  He was encouraged to contact us if he was not going to be able to  afford his medications and he was also advised that it would be harmful to  him to quit taking them without letting us know.   The patient's white count was elevated on March 03, 2002, at 17,000.  He  had had an elevated white count at 12,500 on August 28 but this was more  elevated; however, he was afebrile, had no difficulties with urination, and  his chest x-ray was clear.  He was afebrile.  No source of infection was  located.  This was considered secondary to MI and outpatient followup was  adequate.   The patient was ambulating without difficulty or shortness of breath on  March 04, 2002.  He was evaluated by Dr. Antoine Poche and  considered stable for  discharge.   LABORATORY DATA:  Hemoglobin 14.3, hematocrit 41.1, WBC 17.4, platelets 233.  Sodium 141, potassium 3.9, CO2 108, carbon dioxide 23, BUN 11, creatinine  0.9, glucose 173.  Total cholesterol 148, triglycerides 158, HDL 41, LDL 75.  LFTs within normal limits except albumin low at 2.8 and SGOT minimally  elevated at 42.  CK-MB peak at [facility] was 442/41.6 with a troponin-I of  5.02.   Chest x-ray:  No report available at this time from [place] and no report  available at [facility] although one was ordered in the emergency room.  If  no report is available in followup, obtain chest x-ray at offices.   CONDITION ON DISCHARGE:  Improved.   DISCHARGE DIAGNOSES:  1. Acute inferior myocardial infarction, out-of-hospital, status post     percutaneous transluminal coronary angioplasty and stent to the right     coronary artery with CYPHER stent this admission.  2. Residual disease in the left anterior descending artery and circumflex of     50% with first diagonal 70% stenosis.  3. Preserved left ventricular function with an ejection fraction of 50-55%     and no mitral regurgitation by catheterization this admission.  4. History of tobacco use, greater than 60-pack years.  5. Leukocytosis, no source of infection found, followup as an outpatient.  6. Hypertension.  7. Dyslipidemia with hypertriglyceridemia.  8. History of gout.  9. Mild obesity.  10.      Status post tonsillectomy.   DISCHARGE INSTRUCTIONS:  His activity level is to include no driving for a  week and no work or sexual or strenuous activity until cleared by [name]  He  is to stick to a low-fat and low-salt diet.  He is to call the office with  problems with the catheterization site.  He is to get a CBC at his next  office visit.  He is to get lipid profile and liver tests in six weeks.  He is to follow up with Dr. Daleen Squibb in [place] and the office will call.  He is  to follow up with  Dr. Dewaine Oats in [place] as scheduled.   DISCHARGE MEDICATIONS:  1.  Lopressor 50 mg one-half tab b.i.d.  2. Coated aspirin 325 mg q.d.  3. Plavix 75 mg q.d.  4.     Zocor 20 mg q.d.  5. Wellbutrin 150 mg b.i.d.  6. Nitroglycerin 0.4 mg p.r.n.       Lavella Hammock, P.A. [facility]                  Thomas C. Wall, [name] [facility]    RG/MEDQ  D:  03/04/2002  T:  03/05/2002  Job:  [id number]   cc:   Concepcion Elk Wall, [name] [facility]

## 2010-11-20 NOTE — Assessment & Plan Note (Signed)
[facility]                              CARDIOLOGY OFFICE NOTE   TOREZ, BEAUREGARD                          MRN:          [id number]  DATE:02/09/2006                            DOB:          August 05, 1955    Mr. Ritchey returns today for  further management of his coronary artery  disease.  Please see  the note from 12/08/2005.   He is having no angina.  He cannot take Vytorin with muscle aches.  He also  had the same problem with Zocor.   He took Crestor at one point but does not remember having a problem with it.  He is not sure why he stopped it.   He is currently on Plavix 75 mg a day, Chantix 1 mg p.o. b.i.d., enteric  coated aspirin 325 mg a day and Fluoxetine 20 mg a day.   He has cut down to 3 cigarettes a day from 3 packs!   PHYSICAL EXAMINATION:  VITAL SIGNS:  His blood pressure today  is 129/77,  pulse 72 and regular, his weight is 239.  Carotids are full without bruits.  There is no JVD.  LUNGS:  Clear.  HEART:  Regular rate and rhythm.  ABDOMEN:  Soft with good bowel sounds.  EXTREMITIES:  No edema.  Pulses are present.   ASSESSMENT AND PLAN:  Mr. Linck is doing well.   PLAN:  1.  Crestor 10 mg a day with followup lipids in six weeks and LFTs.  2.  Follow up with me in November.  3.  Continue to try to stop smoking.                               Thomas C. Daleen Squibb, MD, [facility]    TCW/MedQ  DD:  02/09/2006  DT:  02/09/2006  Job #:  [id number]   cc:   Dewaine Oats

## 2010-11-20 NOTE — H&P (Signed)
NAMEHESTER, FORGET                             ACCOUNT NO.:  [id number]   MEDICAL RECORD NO.:  [id number]                   PATIENT TYPE:  INP   LOCATION:  2931                                 FACILITY:  [facility]   PHYSICIAN:  Salvadore Farber, [name]             DATE OF BIRTH:  1956/07/04   DATE OF ADMISSION:  10/28/2002  DATE OF DISCHARGE:                                HISTORY & PHYSICAL   CHIEF COMPLAINT:  Inferior myocardial infarction.   HISTORY OF PRESENT ILLNESS:  The patient is a 55 year old gentleman status  post inferior myocardial infarction in August 2003.  He was treated at that  time with Cypher stenting of the proximal RCA.  Peak CPK was 694 and he had  an ejection fraction of 55%.   He now presents with recurrent chest discomfort beginning at 2:00 this  afternoon.  He had a ventricular fibrillation arrest in the ambulance en  route from his home to [facility].  There, electrocardiogram  demonstrated inferior ST elevations.  He was treated with amiodarone,  aspirin, Plavix, ReoPro, Lopressor, and heparin.  He is now transferred for  cardiac catheterization.   PAST MEDICAL HISTORY:  Coronary artery disease as above, hypertension,  dyslipidemia, gout, status post tonsillectomy, status post right eye  surgery.   ALLERGIES:  Codeine, penicillin, IV contrast (the patient claims possible  anaphylaxis during cardiac catheterization in August, this was not  documented in either the discharge summary nor the catheterization report),  Toprol-XL causes itching.   MEDICATIONS AT HOME:  Allopurinol, hydrochlorothiazide, Norvasc, Zocor 10 mg  per day, Foltx, aspirin 81 mg per day, Indomethacin p.r.n., nitroglycerin  p.r.n.   SOCIAL HISTORY:  The patient is a married Statistician.  He quit tobacco in  August.  Denies alcohol.   FAMILY HISTORY:  Father died in his 46s of coronary disease.   REVIEW OF SYSTEMS:  Negative in detail except as above.  Specifically  negative  for congestive heart failure symptoms, angina, and claudication.   PHYSICAL EXAMINATION:  GENERAL:  This is a somnolent man after receiving  opiates.  He answers questions appropriately and asks appropriate questions  regarding the procedure and his situation.  VITAL SIGNS:  Heart rate 78, blood pressure 174/89.  NECK:  There is no jugular venous distention.  LUNGS:  Clear to auscultation.  CARDIOVASCULAR:  He has a regular rate and rhythm with distant heart sounds.  ABDOMEN:  Soft, nondistended, nontender.  Normal bowel sounds.  EXTREMITIES:  Warm without edema.  PULSES:  Carotid, femoral, and DP pulses are 2+ bilaterally.  There are no  femoral or carotid bruits.   LABORATORY AND DIAGNOSTIC TESTS:  Electrocardiogram:  Normal sinus rhythm  with inferior ST elevations of approximately 2 mm with ST depression in V2.   Laboratory remarkable for a hematocrit of 40.  PTT 23, INR 1.1.   Chest x-ray from [place] demonstrates  moderate CHF.  Potassium is 4.2.  PTT  23, INR 1.1.   IMPRESSION AND PLAN:  The patient with acute recurrent inferior myocardial  infarction complicated by a ventricular fibrillatory arrest.  He is  neurologically intact after prompt resuscitation.  With ongoing pain and ST  elevations, we will proceed urgently to catheterization with an eye to  primary percutaneous intervention.                                               Salvadore Farber, [name]    WED/MEDQ  D:  10/28/2002  T:  10/30/2002  Job:  [id number]   cc:   Thomas C. Wall, [name]   Dewaine Oats  [address]  [place]  [place] [address]  Fax: [phone]

## 2010-11-20 NOTE — Discharge Summary (Signed)
[name], [name]                             ACCOUNT NO.:  [id number]   MEDICAL RECORD NO.:  [id number]                   PATIENT TYPE:  INP   LOCATION:  3727                                 FACILITY:  [facility]   PHYSICIAN:  Salvadore Farber, [name]             DATE OF BIRTH:  06-25-1956   DATE OF ADMISSION:  10/28/2002  DATE OF DISCHARGE:  11/03/2002                           DISCHARGE SUMMARY - REFERRING   PROCEDURES:  1. Cardiac catheterization.  2. Coronary arteriogram.  3. Left ventriculogram.  4. Unsuccessful percutaneous intervention of the right coronary artery.  5. A 2-D echocardiogram.  6. Abdominal ultrasound.   HOSPITAL COURSE:  The patient is a 55 year old gentleman who had an inferior  myocardial infarction in August 2003.  At that time, he had a Cypher stent  to the proximal RCA.  On the day of admission at approximately 2 o'clock in  the afternoon he had onset of chest discomfort.  He had a ventricular  fibrillation arrest en route in the ambulance from his home to [facility].  At [facility], his EKG  demonstrated inferior ST elevation and he was transferred to [facility] for further evaluation and catheterization.   The catheterization showed a normal left main and an LAD with a 50% distal  stenosis.  The first diagonal had a 40% lesion and the circumflex had a 50%  lesion.  There were modest left to right collaterals.  The RCA was occluded  at the stent origin.  At first, they were able to cross the lesion with a  wire but they were unable to perform percutaneous intervention and medical  therapy was recommended.   The next day, the patient had worsening chest pain and nausea.  An abdominal  ultrasound was obtained which showed no gallstones and no evidence of  cholecystitis or cholelithiasis.  Both kidneys were normal in size with no  hydronephrosis or mass effect.  There was normal echogenicity.  The pancreas  and the aorta were obscured by overlying bowel gas.  The spleen was upper  limits of normal for the patient's size and no focal masses were seen.  He  was treated symptomatically and improved over the next 48 hours.   The patient had recurrent angina with ambulation, as well as bradycardia  secondary to beta-blockers.  The dosage was adjusted.  His enzymes were  still trending up but his chest pain was successfully treated with IV  nitroglycerin.  Additionally, he developed a pleuritic component to his pain  which was treated successfully with Toradol.  A 2-D echocardiogram was  ordered to evaluate for pericardial effusion.   The echocardiogram showed an EF of 55%-65% with mild mitral valvular  regurgitation.  There was no pericardial effusion and there was no  abnormality seen in the pericardium.  Right ventricular size and systolic  function was normal.  His  symptoms resolved with medications given and no  further workup was needed.   The patient had some episodes of diarrhea and treated this with Imodium.  The situation resolved and he was afebrile and his white count was within  normal limits.  The patient had hyperglycemia upon admission with a blood  sugar of 197.  It peaked at 237 but hemoglobin A1c was checked and was  within normal limits at 5.5.  It was felt that this was a stress response to  his MI and no further workup is indicated at this time.   A cholesterol profile was performed which showed his total cholesterol was  111, triglycerides 190, HDL 36, LDL 37.  The patient had been on Zocor at 10  mg a day prior to admission but this was increased to 40.   A social work and Sports coach consult was called to help with medications.  He was referred to the [facility] and was given  prescriptions from the hospital.  Medication assistance form from [employer] [facility]  and [employer] Patient Assistance Program were also filled out.  He will pick up  office samples on  his office visit if they are available.   The patient had elevated transaminases, as well as the abdominal pain, so a  GI consult was called.  He was evaluated by Dr. Arlyce Dice and it was felt that  the abdominal pain was musculoskeletal not visceral in origin and that the  abnormal LFT's were probably secondary to a combination of minor shock  heart.  It was felt that he needed Tylenol for pain but no further GI workup  was indicated at this time.  Dr. Antoine Poche felt that his lipid profile and  CBC needed to be checked next week but that he could continue on the Zocor.   By Nov 03, 2002, the patient was ambulating without chest pain or shortness  of breath and his laboratory values were normal.  He was considered stable  for discharge on Nov 03, 2002.   LABORATORY VALUES:  Hemoglobin 10.6, hematocrit 29.7, WBC 8.1, platelets  241.  Sodium 140, potassium 3.8, chloride 108, CO2 26, BUN 10, creatinine 1,  glucose 93.  AST 40, ALT 86, alkaline phosphatase 69, total bilirubin 0.9,  protein 5.6, albumin 2.8.  Diabetes 0.2, indirect bilirubin 0.7.  CK-MB peak  2518/333.6 with a troponin of 26.62.  C. difficile toxin stool culture,  Giardia, Cryptosporidium pending at the time of dictation.   DISCHARGE CONDITION:  Improved.   DISCHARGE DIAGNOSES:  1. Acute inferior myocardial infarction with ventricular fibrillation     arrest, unsuccessful percutaneous coronary intervention of right coronary     artery.  2. History of myocardial infarction in August 2003 with Cypher stent to the     proximal right coronary artery.  3. Hypertension.  4. Dyslipidemia.  5. Gout.  6. Status post tonsillectomy and right sinus surgery.  7. Allergy to CODEINE, PENICILLIN, and TOPROL XL, as well as IV CONTRAST.  8. Remote history of tobacco use.  9. Family history of premature coronary artery disease.  10.      Sinus bradycardia secondary to medications. 11.      Chronic obstructive pulmonary disease.  12.       Diarrhea, resolved.  13.      Anemia, stable.  14.      Abdominal pain with no abnormalities seen on ultrasound and     gastrointestinal evaluation with no workup indicated  at this time.  15.      Elevated liver enzymes.   DISCHARGE INSTRUCTIONS:  1. His activity level is to include no driving for a week and no sexual or     strenuous activity or work until cleared by [name]  2. He is to stick to a low-fat diet.  3. He is to call the office for problems with the catheterization site.  4. He is to see Dr. Arlana Pouch as needed.  5. He is to see Dr. Daleen Squibb in about two weeks.  6. He is to get a CBC and liver profile next week.   DISCHARGE MEDICATIONS:  1. Altace 5 mg daily.  2. Aspirin 325 mg daily.  3. Nitroglycerin p.r.n.  4. Foltx daily.  5. Atenolol 50 mg 1/2 tablet daily.  6. Zocor 40 mg daily.  7. Allopurinol daily.  8. Indomethacin p.r.n.  9. He is not to take Norvasc.     Lavella Hammock, P.A. [facility]                  Salvadore Farber, [name]    RG/MEDQ  D:  11/03/2002  T:  11/03/2002  Job:  [id number]   cc:   Thomas C. Wall, [name]   Dewaine Oats  [address]  [place]  [place] [address]  Fax: [phone]   Barbette Hair. Arlyce Dice, [name] [facility]

## 2010-11-20 NOTE — Discharge Summary (Signed)
[name], [name]                 ACCOUNT NO.:  [id number]   MEDICAL RECORD NO.:  [id number]          PATIENT TYPE:  INP   LOCATION:  6531                         FACILITY:  [facility]   PHYSICIAN:  Stratton Bing, [name] LHCDATE OF BIRTH:  [birth year]   DATE OF ADMISSION:  11/26/2005  DATE OF DISCHARGE:  11/27/2005                                 DISCHARGE SUMMARY   PROCEDURES:  1.  Cardiac catheterization.  2.  Coronary arteriogram.  3.  Left ventriculogram.  4.  Percutaneous intervention with a drug-eluting stent x1.   PRIMARY DIAGNOSIS:  Unstable anginal pain   SECONDARY DIAGNOSES:  1.  Status post inferior myocardial infarction in 2003 with drug-eluting      stent to right coronary artery.  2.  Status post myocardial infarction in 2004 with the right coronary artery      totaled and unsuccessful percutaneous intervention.  3.  Ischemic cardiomyopathy with an ejection fraction of 44% by Myoview in      2006.  4.  Hypertension.  5.  Hyperlipidemia.  6.  Gout.  7.  Tobacco abuse.  8.  Family history of coronary artery disease.  9.  History of noncompliance.  10. Allergy or intolerance to IV, PENICILLIN, CODEINE and ACE INHIBITORS.  11. Obesity.   To time at discharge: 34 minutes.   HOSPITAL COURSE:  [name] is a 55 year old male with known coronary artery  disease.  He had left arm aching, which is his angina, and came to the  office.  He was admitted for further evaluation and treatment.   It was felt he needed cardiac catheterization, and this was performed on Nov 27, 2005.  The cardiac catheterization showed an 80% diagonal.  Left main  and LAD had a 30% stenosis, and the circumflex had a 40-50% distal lesion.  The RCA was totaled, but this was chronic.  Left-to-right collaterals.  Dr.  Juanda Chance performed percutaneous intervention and Taxus stent, reducing the  diagonal stenosis from 90% to zero.   The next day, [name] was without pain in his left arm or back.  His  postprocedure enzymes were negative.  Of note, his glucose was elevated at a  195, but he had received steroids for DYE allergy prior to the procedure.  His white count was also slightly elevated, but, with no fever, no cough,  and no dysuria, it is felt that this is secondary to steroid administration  as well.  Pending evaluation by Dr. Dietrich Pates, [name] is tentatively  considered stable for discharge on Nov 27, 2005, with outpatient followup  arranged.   DISCHARGE INSTRUCTIONS:  1.  His activity level is to be increased gradually.  2.  He is to call our office for problems with the catheterization site.  3.  He is to follow up with Dr. Vern Claude PA on June 6 and 1:45 and with Dr.      Arlana Pouch as needed.   DISCHARGE MEDICATIONS:  1.  Coated aspirin 325 mg daily.  2.  Plavix 75 mg daily.  3.  Nitroglycerin sublingual p.r.n.  4.  Chantix 1 mg b.i.d.      Theodore Demark, P.A. [facility]       Bing, [name] [facility]  Electronically Signed    RB/MEDQ  D:  11/27/2005  T:  11/28/2005  Job:  [id number]   cc:   Dewaine Oats, [name]

## 2011-04-22 LAB — BASIC METABOLIC PANEL
BUN: 7
Chloride: 105
Creatinine, Ser: 0.91
GFR calc Af Amer: >60
GFR calc Af Amer: >60
GFR calc non Af Amer: >60
Potassium: 3.9
Potassium: 4.1
Sodium: 135

## 2011-04-22 LAB — CBC
Hemoglobin: 15.3
MCHC: 34.8
MCV: 85.5
MCV: 86
RBC: 4.74
RBC: 5.09
RDW: 14.4 — ABNORMAL HIGH
WBC: 13.6 — ABNORMAL HIGH

## 2011-04-22 LAB — DIFFERENTIAL
Basophils Absolute: 0
Basophils Relative: 0
Eosinophils Absolute: 0.2
Monocytes Absolute: 0.9 — ABNORMAL HIGH
Monocytes Relative: 9

## 2015-06-07 ENCOUNTER — Inpatient Hospital Stay
Admission: EM | Admit: 2015-06-07 | Discharge: 2015-06-11 | DRG: 287 | Disposition: A | Discharge status: Home / Self Care | Payer: No Typology Code available for payment source | Attending: Internal Medicine | Admitting: Internal Medicine

## 2015-06-07 ENCOUNTER — Inpatient Hospital Stay (HOSPITAL_COMMUNITY)
Admit: 2015-06-07 | Discharge: 2015-06-07 | Disposition: A | Discharge status: Home / Self Care | Payer: No Typology Code available for payment source | Attending: Internal Medicine | Admitting: Internal Medicine

## 2015-06-07 ENCOUNTER — Emergency Department: Payer: No Typology Code available for payment source

## 2015-06-07 ENCOUNTER — Encounter: Payer: Self-pay | Admitting: Emergency Medicine

## 2015-06-07 DIAGNOSIS — E785 Hyperlipidemia, unspecified: Secondary | ICD-10-CM | POA: Diagnosis present

## 2015-06-07 DIAGNOSIS — Z79899 Other long term (current) drug therapy: Secondary | ICD-10-CM

## 2015-06-07 DIAGNOSIS — H538 Other visual disturbances: Secondary | ICD-10-CM | POA: Diagnosis not present

## 2015-06-07 DIAGNOSIS — R072 Precordial pain: Secondary | ICD-10-CM

## 2015-06-07 DIAGNOSIS — Z7982 Long term (current) use of aspirin: Secondary | ICD-10-CM | POA: Diagnosis not present

## 2015-06-07 DIAGNOSIS — R001 Bradycardia, unspecified: Secondary | ICD-10-CM | POA: Diagnosis not present

## 2015-06-07 DIAGNOSIS — R451 Restlessness and agitation: Secondary | ICD-10-CM | POA: Diagnosis not present

## 2015-06-07 DIAGNOSIS — I252 Old myocardial infarction: Secondary | ICD-10-CM

## 2015-06-07 DIAGNOSIS — I2582 Chronic total occlusion of coronary artery: Secondary | ICD-10-CM | POA: Diagnosis present

## 2015-06-07 DIAGNOSIS — R42 Dizziness and giddiness: Secondary | ICD-10-CM | POA: Diagnosis not present

## 2015-06-07 DIAGNOSIS — Z955 Presence of coronary angioplasty implant and graft: Secondary | ICD-10-CM | POA: Diagnosis not present

## 2015-06-07 DIAGNOSIS — I2510 Atherosclerotic heart disease of native coronary artery without angina pectoris: Secondary | ICD-10-CM

## 2015-06-07 DIAGNOSIS — I25110 Atherosclerotic heart disease of native coronary artery with unstable angina pectoris: Secondary | ICD-10-CM | POA: Diagnosis present

## 2015-06-07 DIAGNOSIS — I25118 Atherosclerotic heart disease of native coronary artery with other forms of angina pectoris: Secondary | ICD-10-CM | POA: Diagnosis not present

## 2015-06-07 DIAGNOSIS — I5042 Chronic combined systolic (congestive) and diastolic (congestive) heart failure: Secondary | ICD-10-CM | POA: Diagnosis present

## 2015-06-07 DIAGNOSIS — Z880 Allergy status to penicillin: Secondary | ICD-10-CM | POA: Diagnosis not present

## 2015-06-07 DIAGNOSIS — R079 Chest pain, unspecified: Secondary | ICD-10-CM

## 2015-06-07 DIAGNOSIS — I509 Heart failure, unspecified: Secondary | ICD-10-CM | POA: Insufficient documentation

## 2015-06-07 DIAGNOSIS — I200 Unstable angina: Secondary | ICD-10-CM

## 2015-06-07 DIAGNOSIS — E876 Hypokalemia: Secondary | ICD-10-CM | POA: Diagnosis not present

## 2015-06-07 DIAGNOSIS — Z885 Allergy status to narcotic agent status: Secondary | ICD-10-CM | POA: Diagnosis not present

## 2015-06-07 DIAGNOSIS — Z87891 Personal history of nicotine dependence: Secondary | ICD-10-CM

## 2015-06-07 DIAGNOSIS — Z7902 Long term (current) use of antithrombotics/antiplatelets: Secondary | ICD-10-CM | POA: Diagnosis not present

## 2015-06-07 DIAGNOSIS — I5023 Acute on chronic systolic (congestive) heart failure: Secondary | ICD-10-CM

## 2015-06-07 DIAGNOSIS — Z9114 Patient's other noncompliance with medication regimen: Secondary | ICD-10-CM

## 2015-06-07 DIAGNOSIS — I5022 Chronic systolic (congestive) heart failure: Secondary | ICD-10-CM | POA: Diagnosis present

## 2015-06-07 DIAGNOSIS — I25111 Atherosclerotic heart disease of native coronary artery with angina pectoris with documented spasm: Secondary | ICD-10-CM | POA: Diagnosis not present

## 2015-06-07 DIAGNOSIS — I10 Essential (primary) hypertension: Secondary | ICD-10-CM | POA: Diagnosis present

## 2015-06-07 DIAGNOSIS — R0600 Dyspnea, unspecified: Secondary | ICD-10-CM

## 2015-06-07 DIAGNOSIS — I110 Hypertensive heart disease with heart failure: Principal | ICD-10-CM | POA: Diagnosis present

## 2015-06-07 DIAGNOSIS — I255 Ischemic cardiomyopathy: Secondary | ICD-10-CM | POA: Diagnosis present

## 2015-06-07 HISTORY — DX: Essential (primary) hypertension: I10

## 2015-06-07 HISTORY — DX: Atherosclerotic heart disease of native coronary artery without angina pectoris: I25.10

## 2015-06-07 HISTORY — DX: Hypokalemia: E87.6

## 2015-06-07 HISTORY — DX: Chronic combined systolic (congestive) and diastolic (congestive) heart failure: I50.42

## 2015-06-07 HISTORY — DX: Hyperlipidemia, unspecified: E78.5

## 2015-06-07 HISTORY — DX: Ischemic cardiomyopathy: I25.5

## 2015-06-07 LAB — TROPONIN I

## 2015-06-07 LAB — COMPREHENSIVE METABOLIC PANEL
ALK PHOS: 83 U/L (ref 38–126)
ALT: 15 U/L — ABNORMAL LOW (ref 17–63)
ANION GAP: 7 (ref 5–15)
AST: 24 U/L (ref 15–41)
Albumin: 3.7 g/dL (ref 3.5–5.0)
BILIRUBIN TOTAL: 2.1 mg/dL — AB (ref 0.3–1.2)
BUN: 19 mg/dL (ref 6–20)
CALCIUM: 8.7 mg/dL — AB (ref 8.9–10.3)
CO2: 21 mmol/L — ABNORMAL LOW (ref 22–32)
Chloride: 110 mmol/L (ref 101–111)
Creatinine, Ser: 1.04 mg/dL (ref 0.61–1.24)
GFR calc Af Amer: >60 mL/min (ref >60)
Glucose, Bld: 145 mg/dL — ABNORMAL HIGH (ref 65–99)
POTASSIUM: 4 mmol/L (ref 3.5–5.1)
Sodium: 138 mmol/L (ref 135–145)
TOTAL PROTEIN: 7.6 g/dL (ref 6.5–8.1)

## 2015-06-07 LAB — CBC WITH DIFFERENTIAL/PLATELET
BASOS ABS: 0.1 10*3/uL (ref 0–0.1)
BASOS PCT: 1 %
EOS ABS: 0.1 10*3/uL (ref 0–0.7)
Eosinophils Relative: 1 %
HEMATOCRIT: 39.2 % — AB (ref 40.0–52.0)
Hemoglobin: 13.5 g/dL (ref 13.0–18.0)
Lymphocytes Relative: 13 %
Lymphs Abs: 1.4 10*3/uL (ref 1.0–3.6)
MCH: 30.8 pg (ref 26.0–34.0)
MCHC: 34.3 g/dL (ref 32.0–36.0)
MCV: 89.6 fL (ref 80.0–100.0)
MONO ABS: 0.8 10*3/uL (ref 0.2–1.0)
Monocytes Relative: 7 %
NEUTROS ABS: 8.5 10*3/uL — AB (ref 1.4–6.5)
Neutrophils Relative %: 78 %
PLATELETS: 222 10*3/uL (ref 150–440)
RBC: 4.37 MIL/uL — ABNORMAL LOW (ref 4.40–5.90)
RDW: 15.7 % — AB (ref 11.5–14.5)
WBC: 10.8 10*3/uL — ABNORMAL HIGH (ref 3.8–10.6)

## 2015-06-07 LAB — CBC
HCT: 39.2 % — ABNORMAL LOW (ref 40.0–52.0)
Hemoglobin: 13.7 g/dL (ref 13.0–18.0)
MCH: 31 pg (ref 26.0–34.0)
MCHC: 34.8 g/dL (ref 32.0–36.0)
MCV: 89.2 fL (ref 80.0–100.0)
PLATELETS: 246 10*3/uL (ref 150–440)
RBC: 4.4 MIL/uL (ref 4.40–5.90)
RDW: 15.7 % — AB (ref 11.5–14.5)
WBC: 9.5 10*3/uL (ref 3.8–10.6)

## 2015-06-07 LAB — BRAIN NATRIURETIC PEPTIDE: B NATRIURETIC PEPTIDE 5: 871 pg/mL — AB (ref 0.0–100.0)

## 2015-06-07 LAB — TSH: TSH: 1.051 u[IU]/mL (ref 0.350–4.500)

## 2015-06-07 LAB — LIPASE, BLOOD: LIPASE: 26 U/L (ref 11–51)

## 2015-06-07 MED ORDER — ASPIRIN 81 MG PO CHEW
324.0000 mg | CHEWABLE_TABLET | Freq: Once | ORAL | Status: AC
  Administered 2015-06-07: 324 mg via ORAL
  Filled 2015-06-07: qty 4

## 2015-06-07 MED ORDER — ISOSORBIDE DINITRATE 10 MG PO TABS
30.0000 mg | ORAL_TABLET | Freq: Four times a day (QID) | ORAL | Status: DC
  Administered 2015-06-07 – 2015-06-11 (×13): 30 mg via ORAL
  Filled 2015-06-07 (×13): qty 3

## 2015-06-07 MED ORDER — SODIUM CHLORIDE 0.9 % IJ SOLN
3.0000 mL | INTRAMUSCULAR | Status: DC | PRN
  Administered 2015-06-09: 3 mL via INTRAVENOUS
  Filled 2015-06-07: qty 10

## 2015-06-07 MED ORDER — FUROSEMIDE 10 MG/ML IJ SOLN
40.0000 mg | Freq: Two times a day (BID) | INTRAMUSCULAR | Status: DC
  Administered 2015-06-07 – 2015-06-08 (×2): 40 mg via INTRAVENOUS
  Filled 2015-06-07 (×2): qty 4

## 2015-06-07 MED ORDER — ENOXAPARIN SODIUM 40 MG/0.4ML ~~LOC~~ SOLN
40.0000 mg | SUBCUTANEOUS | Status: DC
  Administered 2015-06-07 – 2015-06-09 (×3): 40 mg via SUBCUTANEOUS
  Filled 2015-06-07 (×3): qty 0.4

## 2015-06-07 MED ORDER — SODIUM CHLORIDE 0.9 % IV SOLN: 250.0000 mL | INTRAVENOUS | Status: DC | PRN

## 2015-06-07 MED ORDER — ENOXAPARIN SODIUM 40 MG/0.4ML ~~LOC~~ SOLN: 40.0000 mg | SUBCUTANEOUS | Status: DC

## 2015-06-07 MED ORDER — NITROGLYCERIN 0.4 MG SL SUBL
0.4000 mg | SUBLINGUAL_TABLET | SUBLINGUAL | Status: DC | PRN
  Administered 2015-06-07 (×3): 0.4 mg via SUBLINGUAL
  Filled 2015-06-07: qty 1

## 2015-06-07 MED ORDER — CARVEDILOL 25 MG PO TABS
25.0000 mg | ORAL_TABLET | Freq: Two times a day (BID) | ORAL | Status: DC
  Administered 2015-06-07 – 2015-06-08 (×2): 25 mg via ORAL
  Filled 2015-06-07 (×2): qty 1

## 2015-06-07 MED ORDER — CLOPIDOGREL BISULFATE 75 MG PO TABS
75.0000 mg | ORAL_TABLET | Freq: Every day | ORAL | Status: DC
  Administered 2015-06-08 – 2015-06-10 (×3): 75 mg via ORAL
  Filled 2015-06-07 (×3): qty 1

## 2015-06-07 MED ORDER — ONDANSETRON HCL 4 MG PO TABS: 4.0000 mg | ORAL_TABLET | Freq: Four times a day (QID) | ORAL | Status: DC | PRN

## 2015-06-07 MED ORDER — ATORVASTATIN CALCIUM 20 MG PO TABS
80.0000 mg | ORAL_TABLET | Freq: Every day | ORAL | Status: DC
  Administered 2015-06-07 – 2015-06-10 (×4): 80 mg via ORAL
  Filled 2015-06-07 (×4): qty 4

## 2015-06-07 MED ORDER — ASPIRIN EC 325 MG PO TBEC: 325.0000 mg | DELAYED_RELEASE_TABLET | Freq: Every day | ORAL | Status: DC

## 2015-06-07 MED ORDER — SODIUM CHLORIDE 0.9 % IJ SOLN
3.0000 mL | Freq: Two times a day (BID) | INTRAMUSCULAR | Status: DC
  Administered 2015-06-07: 3 mL via INTRAVENOUS

## 2015-06-07 MED ORDER — ACETAMINOPHEN 650 MG RE SUPP: 650.0000 mg | Freq: Four times a day (QID) | RECTAL | Status: DC | PRN

## 2015-06-07 MED ORDER — ONDANSETRON HCL 4 MG/2ML IJ SOLN
4.0000 mg | Freq: Four times a day (QID) | INTRAMUSCULAR | Status: DC | PRN
  Administered 2015-06-10: 4 mg via INTRAVENOUS
  Filled 2015-06-07: qty 2

## 2015-06-07 MED ORDER — LISINOPRIL 20 MG PO TABS
20.0000 mg | ORAL_TABLET | Freq: Every day | ORAL | Status: DC
  Administered 2015-06-07: 20 mg via ORAL
  Filled 2015-06-07: qty 1

## 2015-06-07 MED ORDER — FUROSEMIDE 10 MG/ML IJ SOLN
40.0000 mg | Freq: Once | INTRAMUSCULAR | Status: AC
  Administered 2015-06-07: 40 mg via INTRAVENOUS
  Filled 2015-06-07: qty 4

## 2015-06-07 MED ORDER — SODIUM CHLORIDE 0.9 % IJ SOLN: 3.0000 mL | INTRAMUSCULAR | Status: DC | PRN

## 2015-06-07 MED ORDER — ASPIRIN 81 MG PO CHEW
81.0000 mg | CHEWABLE_TABLET | Freq: Every day | ORAL | Status: DC
  Administered 2015-06-08 – 2015-06-11 (×4): 81 mg via ORAL
  Filled 2015-06-07 (×4): qty 1

## 2015-06-07 MED ORDER — ACETAMINOPHEN 325 MG PO TABS
650.0000 mg | ORAL_TABLET | Freq: Four times a day (QID) | ORAL | Status: DC | PRN
  Administered 2015-06-07 – 2015-06-08 (×2): 650 mg via ORAL
  Filled 2015-06-07 (×2): qty 2

## 2015-06-07 NOTE — H&P (Signed)
[facility] [place] [facility]   PATIENT NAME: [name]    MR#:  [id number]  DATE OF BIRTH:  Sep 21, 1955  DATE OF ADMISSION:  06/07/2015  PRIMARY CARE PHYSICIAN: Dr. Elray Buba REQUESTING/REFERRING PHYSICIAN: Dr. Bettey Mare  CHIEF COMPLAINT:   Chief Complaint  Patient presents with  . Chest Pain    HISTORY OF PRESENT ILLNESS: [name]  is a 59 y.o. male with a known history of coronary artery disease with multiple stents in the past as well as hypertension presenting with shortness of breath and chest pressure. Patient reports that his last stent was 7-8 years ago. He started having shortness of breath and chest pressure for one weeks duration. He denies any swelling in the lower extremity but states that he is unable to lay flat at nighttime. And have to sleep brace stop. He has had some dry cough no significant wheezing.  PAST MEDICAL HISTORY:   Past Medical History  Diagnosis Date  . MI (myocardial infarction) (HCC)   . Hypertension     PAST SURGICAL HISTORY:  Past Surgical History  Procedure Laterality Date  . Coronary stent placement      SOCIAL HISTORY:  Social History  Substance Use Topics  . Smoking status: Former Games developer  . Smokeless tobacco: Not on file  . Alcohol Use: No    FAMILY HISTORY:  Family History  Problem Relation Age of Onset  . Coronary artery disease Mother   . Coronary artery disease Father   . Diabetes Mother     DRUG ALLERGIES:  Allergies  Allergen Reactions  . Hydrocodone Itching  . Iohexol      Onset Date: [id number]   . Morphine And Related   . Penicillins     REVIEW OF SYSTEMS:   CONSTITUTIONAL: No fever, fatigue or weakness.  EYES: No blurred or double vision.  EARS, NOSE, AND THROAT: No tinnitus or ear pain.  RESPIRATORY: Dry cough, positive shortness of breath, wheezing or hemoptysis.  CARDIOVASCULAR: Positive chest pressure, orthopnea, edema.  GASTROINTESTINAL: No nausea, vomiting,  diarrhea or abdominal pain.  GENITOURINARY: No dysuria, hematuria.  ENDOCRINE: No polyuria, nocturia,  HEMATOLOGY: No anemia, easy bruising or bleeding SKIN: No rash or lesion. MUSCULOSKELETAL: No joint pain or arthritis.   NEUROLOGIC: No tingling, numbness, weakness.  PSYCHIATRY: No anxiety or depression.   MEDICATIONS AT HOME:  Prior to Admission medications   Medication Sig Start Date End Date Taking? Authorizing Provider  carvedilol (COREG) 25 MG tablet Take 25 mg by mouth 2 (two) times daily with a meal.   Yes Historical Provider, MD  clopidogrel (PLAVIX) 75 MG tablet Take 75 mg by mouth daily.   Yes Historical Provider, MD  isosorbide dinitrate (ISORDIL) 30 MG tablet Take 30 mg by mouth 4 (four) times daily.   Yes Historical Provider, MD      PHYSICAL EXAMINATION:   VITAL SIGNS: Blood pressure 160/98, pulse 55, temperature 97.6 F (36.4 C), temperature source Oral, resp. rate 19, height 5\' 6"  (1.676 m), weight 96.979 kg (213 lb 12.8 oz), SpO2 99 %.  GENERAL:  59 y.o.-year-old patient lying in the bed with no acute distress.  EYES: Pupils equal, round, reactive to light and accommodation. No scleral icterus. Extraocular muscles intact.  HEENT: Head atraumatic, normocephalic. Oropharynx and nasopharynx clear.  NECK:  Supple, no jugular venous distention. No thyroid enlargement, no tenderness.  LUNGS:  Bilateral crackles at the bases no  accesory muscle usage CARDIOVASCULAR: S1, S2 normal. No murmurs, rubs,  or gallops.  ABDOMEN: Soft, nontender, nondistended. Bowel sounds present. No organomegaly or mass.  EXTREMITIES: No pedal edema, cyanosis, or clubbing.  NEUROLOGIC: Cranial nerves II through XII are intact. Muscle strength 5/5 in all extremities. Sensation intact. Gait not checked.  PSYCHIATRIC: The patient is alert and oriented x 3.  SKIN: No obvious rash, lesion, or ulcer.   LABORATORY PANEL:   CBC  Recent Labs Lab 06/07/15 0749  WBC 10.8*  HGB 13.5  HCT 39.2*   PLT 222  MCV 89.6  MCH 30.8  MCHC 34.3  RDW 15.7*  LYMPHSABS 1.4  MONOABS 0.8  EOSABS 0.1  BASOSABS 0.1   ------------------------------------------------------------------------------------------------------------------  Chemistries   Recent Labs Lab 06/07/15 0749  NA 138  K 4.0  CL 110  CO2 21*  GLUCOSE 145*  BUN 19  CREATININE 1.04  CALCIUM 8.7*  AST 24  ALT 15*  ALKPHOS 83  BILITOT 2.1*   ------------------------------------------------------------------------------------------------------------------ estimated creatinine clearance is 84.4 mL/min (by C-G formula based on Cr of 1.04). ------------------------------------------------------------------------------------------------------------------  Recent Labs  06/07/15 1132  TSH 1.051     Coagulation profile No results for input(s): INR, PROTIME in the last 168 hours. ------------------------------------------------------------------------------------------------------------------- No results for input(s): DDIMER in the last 72 hours. -------------------------------------------------------------------------------------------------------------------  Cardiac Enzymes  Recent Labs Lab 06/07/15 0749 06/07/15 1132  TROPONINI <0.03 <0.03   ------------------------------------------------------------------------------------------------------------------ Invalid input(s): POCBNP  ---------------------------------------------------------------------------------------------------------------  Urinalysis No results found for: COLORURINE, APPEARANCEUR, LABSPEC, PHURINE, GLUCOSEU, HGBUR, BILIRUBINUR, KETONESUR, PROTEINUR, UROBILINOGEN, NITRITE, LEUKOCYTESUR   RADIOLOGY: Dg Chest Portable 1 View  06/07/2015  CLINICAL DATA:  Chest pain and shortness of breath for 1 week. History of previous cardiac stenting. EXAM: PORTABLE CHEST 1 VIEW COMPARISON:  Chest x-ray dated 05/31/2010. FINDINGS: Moderate cardiomegaly  is unchanged. There is central pulmonary vascular congestion and bilateral interstitial edema. Slightly more confluent airspace opacity at the right lung base suggests early alveolar pulmonary edema. No pleural effusion seen. No pneumothorax. Osseous and soft tissue structures about the chest are unremarkable. IMPRESSION: Cardiomegaly with central pulmonary vascular congestion and bilateral interstitial edema, perhaps early alveolar pulmonary edema within the right lung, consistent with congestive heart failure. Electronically Signed   By: Bary Richard [name]   On: 06/07/2015 08:05    EKG: Orders placed or performed during the hospital encounter of 06/07/15  . EKG 12-Lead  . EKG 12-Lead    IMPRESSION AND PLAN: Patient is a 59 year old white male with history of coronary artery disease presents with shortness of breath  1. Acute CHF: Type unknown at this time I will obtain echocardiogram of the heart, cardiology consult, IV Lasix.  2. Chest pressure: Serial cardiac enzymes, cardiology consult, continue Plavix and aspirin continue isosorbide mononitrate.  3. Hypertension continue carvedilol .  4. Miscellaneous Lovenox for DVT prophylaxis   All the records are reviewed and case discussed with ED provider. Management plans discussed with the patient, family and they are in agreement.  CODE STATUS:    Code Status Orders        Start     Ordered   06/07/15 1122  Full code   Continuous     06/07/15 1121       TOTAL TIME TAKING CARE OF THIS PATIENT: 55 minutes.    Auburn Bilberry M.D on 06/07/2015 at 12:46 PM  Between 7am to 6pm - Pager - [phone]  After 6pm go to [email or web address] - password [email or web address] [facility]  [facility]  Office  [phone]  CC: Primary care physician; No primary care provider on  file.

## 2015-06-07 NOTE — ED Provider Notes (Signed)
[facility] Emergency Department Provider Note  ____________________________________________  Time seen: 7:50 AM  I have reviewed the triage vital signs and the nursing notes.   HISTORY  Chief Complaint Chest Pain    HPI [name]. is a 59 y.o. male who complains of worsening shortness of breath for a week and chest pain for the last 3 days. The chest pain is on and off, tightness in the center chest, nonradiating, no vomiting but does have severe shortness of breath. Also feels diaphoretic at times. Severe symptoms with exertion including chest pain and shortness of breath. Also complains of orthopnea and having to sit upright to sleep over the last week. Chest. Has a history of CAD with multiple stents. He is compliant with Plavix therapy but does not take aspirin due to problems with gout.     Past Medical History  Diagnosis Date  . MI (myocardial infarction) (HCC)   . Hypertension      There are no active problems to display for this patient.    Past Surgical History  Procedure Laterality Date  . Coronary stent placement       No current outpatient prescriptions on file. Isosorbide mononitrate Plavix  Allergies Hydrocodone; Iohexol; Morphine and related; and Penicillins   No family history on file.  Social History Social History  Substance Use Topics  . Smoking status: Former Games developer  . Smokeless tobacco: None  . Alcohol Use: No    Review of Systems  Constitutional:   No fever or chills. No weight changes Eyes:   No blurry vision or double vision.  ENT:   No sore throat. Cardiovascular:   Positive chest pain. Respiratory:   Positive dyspnea without cough. Gastrointestinal:   Negative for abdominal pain, vomiting and diarrhea.  No BRBPR or melena. Genitourinary:   Negative for dysuria, urinary retention, bloody urine, or difficulty urinating. Musculoskeletal:   Negative for back pain. No joint swelling or pain. Skin:    Negative for rash. Neurological:   Negative for headaches, focal weakness or numbness. Psychiatric:  No anxiety or depression.   Endocrine:  No hot/cold intolerance, changes in energy, or sleep difficulty.  10-point ROS otherwise negative.  ____________________________________________   PHYSICAL EXAM:  VITAL SIGNS: ED Triage Vitals  Enc Vitals Group     BP 06/07/15 0736 160/101 mmHg     Pulse Rate 06/07/15 0736 73     Resp 06/07/15 0736 20     Temp 06/07/15 0736 97.8 F (36.6 C)     Temp Source 06/07/15 0736 Oral     SpO2 06/07/15 0736 94 %     Weight 06/07/15 0736 220 lb 0.3 oz (99.8 kg)     Height 06/07/15 0736 5\' 6"  (1.676 m)     Head Cir --      Peak Flow --      Pain Score 06/07/15 0739 0     Pain Loc --      Pain Edu? --      Excl. in GC? --      Constitutional:   Alert and oriented. Mild distress Eyes:   No scleral icterus. No conjunctival pallor. PERRL. EOMI ENT   Head:   Normocephalic and atraumatic.   Nose:   No congestion/rhinnorhea. No septal hematoma   Mouth/Throat:   MMM, no pharyngeal erythema. No peritonsillar mass. No uvula shift.   Neck:   No stridor. No SubQ emphysema. No meningismus. Hematological/Lymphatic/Immunilogical:   No cervical lymphadenopathy. Cardiovascular:  RRR, frequent PVCs in a bigeminy pattern on the monitor. Normal and symmetric distal pulses are present in all extremities. No murmurs, rubs, or gallops. No JVD Respiratory:   Tachypnea. Diminished breath sounds in the bilateral bases.. Gastrointestinal:   Soft and nontender. No distention. There is no CVA tenderness.  No rebound, rigidity, or guarding. Genitourinary:   deferred Musculoskeletal:   Nontender with normal range of motion in all extremities. No joint effusions.  No lower extremity tenderness.  No edema. Neurologic:   Normal speech and language.  CN 2-10 normal. Motor grossly intact.  No gross focal neurologic deficits are appreciated.  Skin:    Skin is  warm, dry and intact. No rash noted.  No petechiae, purpura, or bullae. Psychiatric:   Mood and affect are normal. Speech and behavior are normal. Patient exhibits appropriate insight and judgment.  ____________________________________________    LABS (pertinent positives/negatives) (all labs ordered are listed, but only abnormal results are displayed) Labs Reviewed  COMPREHENSIVE METABOLIC PANEL - Abnormal; Notable for the following:    CO2 21 (*)    Glucose, Bld 145 (*)    Calcium 8.7 (*)    ALT 15 (*)    Total Bilirubin 2.1 (*)    All other components within normal limits  CBC WITH DIFFERENTIAL/PLATELET - Abnormal; Notable for the following:    WBC 10.8 (*)    RBC 4.37 (*)    HCT 39.2 (*)    RDW 15.7 (*)    Neutro Abs 8.5 (*)    All other components within normal limits  LIPASE, BLOOD  TROPONIN I   ____________________________________________   EKG  Interpreted by me Sinus rhythm rate of 70, right axis, normal intervals. Poor R-wave progression in anterior precordial leads, inferior Q waves indicative of prior MI, normal ST segments and T waves.  ____________________________________________    RADIOLOGY  Chest x-ray reveals pulmonary edema diffusely  ____________________________________________   PROCEDURES   ____________________________________________   INITIAL IMPRESSION / ASSESSMENT AND PLAN / ED COURSE  Pertinent labs & imaging results that were available during my care of the patient were reviewed by me and considered in my medical decision making (see chart for details).  Patient presents with shortness of breath and chest pain in the setting of severe underlying CAD. Symptoms consistent with CHF exacerbation. We'll give aspirin and nitroglycerin and follow-up labs. Chest x-ray interpreted at the bedside by me as it was performed during my initial evaluation. Consistent with pulmonary edema.  ----------------------------------------- 8:52 AM on  06/07/2015 -----------------------------------------  Symptoms remain uncontrolled. We'll give IV Lasix and plan for hospitalization for further management. Troponin negative, no acute ischemic changes on EKG. Discussed with hospitalist Dr. Allena Katz.     ____________________________________________   FINAL CLINICAL IMPRESSION(S) / ED DIAGNOSES  Final diagnoses:  Acute on chronic congestive heart failure, unspecified congestive heart failure type (HCC)  Chest pain, unspecified chest pain type      Sharman Cheek, MD 06/07/15 [phone]

## 2015-06-07 NOTE — ED Notes (Signed)
EKG rate 64, palpable pulse rate 34, patient appears to be in ventricular bigeminy. MD aware.

## 2015-06-07 NOTE — Plan of Care (Signed)
Problem: Health Behavior/Discharge Planning: Goal: Ability to manage health-related needs will improve for discharge Outcome: Progressing Pt is alert and oriented x 4, from home with wife, came to ED for chest pain, hx of stent placement, low fall, independent, good urine output, last bm on 12/3, fair appetite, using oxygen for comfort, vital signs stable, receiving po medications for htn, denies chest pain during shift, c/o headache improved with tylenol, [facility] cardiology consulted with patient, echo performed, chest xray negative, uneventful shift.

## 2015-06-07 NOTE — Progress Notes (Signed)
*  PRELIMINARY RESULTS* Echocardiogram 2D Echocardiogram has been performed.  [name] 06/07/2015, 3:36 PM

## 2015-06-07 NOTE — Consult Note (Signed)
Patient ID: [name]. MRN: [id number] DOB/AGE: 1955/11/06 59 y.o.  Admit date: 06/07/2015 Primary Physician No primary care provider on file.  Primary Cardiologist Dorothyann Peng, MD   Chief Complaint  Chest pain, shortness of breath  HPI: [name] is a 59 year old man with CAD status post MI and PCI, hypertension, and chronic systolic heart failure LVEF 45% here with chest pain and shortness of breath. He reports that his breathing has been getting progressively worse over the last several months. However in the last week it has been much worse. One week ago he had an episode of chest pain while working underneath a house. It lasted for several seconds. He does not think it was associated with increased shortness of breath, lightheadedness, dizziness, palpitations, nausea, vomiting or diaphoresis. Since then he continues to have intermittent episodes of chest discomfort that occur both at rest and with exertion. It is unlike the feeling he had with his past MIs. He's never had chest pain, but rather had discomfort in his left arm.  [name] denies lower extremity edema, but he does endorse orthopnea. He has unintentionally lost 37 pounds in the last several months.  He presented to the ED, where he was noted to be hypertensive to 160/101. His initial EKG showed evidence of prior inferior MI and right axis deviation but was otherwise unremarkable. He had 2 negative sets of cardiac enzymes and was admitted to the internal medicine service. Cardiology was counseled today for management of his chest pain and shortness of breath.  Review of Systems:  A 12 point review of systems was obtained and was negative with exceptions as noted in the history of present illness.  Past Medical History  Diagnosis Date  . MI (myocardial infarction) (HCC)   . Hypertension     Medications Prior to Admission  Medication Sig Dispense Refill  . carvedilol (COREG) 25 MG tablet Take 25 mg by mouth 2 (two)  times daily with a meal.    . clopidogrel (PLAVIX) 75 MG tablet Take 75 mg by mouth daily.    . isosorbide dinitrate (ISORDIL) 30 MG tablet Take 30 mg by mouth 4 (four) times daily.       [name] aspirin EC  325 mg Oral Daily  . carvedilol  25 mg Oral BID WC  . clopidogrel  75 mg Oral Daily  . enoxaparin (LOVENOX) injection  40 mg Subcutaneous Q24H  . furosemide  40 mg Intravenous Q12H  . isosorbide dinitrate  30 mg Oral QID  . sodium chloride  3 mL Intravenous Q12H    Infusions:    Allergies  Allergen Reactions  . Hydrocodone Itching  . Iohexol      Onset Date: [id number]   . Morphine And Related   . Penicillins     Social History   Social History  . Marital Status: Married    Spouse Name: N/A  . Number of Children: N/A  . Years of Education: N/A   Occupational History  . Not on file.   Social History Main Topics  . Smoking status: Former Games developer  . Smokeless tobacco: Not on file  . Alcohol Use: No  . Drug Use: No  . Sexual Activity: Not on file   Other Topics Concern  . Not on file   Social History Narrative  . No narrative on file    Family History  Problem Relation Age of Onset  . Coronary artery disease Mother   . Coronary artery disease  Father   . Diabetes Mother     PHYSICAL EXAM: Filed Vitals:   06/07/15 1000 06/07/15 1049  BP: 164/94 160/98  Pulse: 117 55  Temp:  97.6 F (36.4 C)  Resp: 23 19     Intake/Output Summary (Last 24 hours) at 06/07/15 1515 Last data filed at 06/07/15 1416  Gross per 24 hour  Intake    240 ml  Output    800 ml  Net   -560 ml    General:  Well appearing. No respiratory difficulty HEENT: normal Neck: supple. no JVD. Carotids 2+ bilat; no bruits. No lymphadenopathy or thryomegaly appreciated. Cor: PMI nondisplaced. Regular rate & rhythm. No rubs, gallops or murmurs. Lungs: Bibasilar crackles. Abdomen: soft, nontender, nondistended. No hepatosplenomegaly. No bruits or masses. Good bowel sounds. Extremities: no  cyanosis, clubbing, rash, edema Neuro: alert & oriented x 3, cranial nerves grossly intact. moves all 4 extremities w/o difficulty. Affect pleasant.  Results for orders placed or performed during the hospital encounter of 06/07/15 (from the past 24 hour(s))  Comprehensive metabolic panel     Status: Abnormal   Collection Time: 06/07/15  7:49 AM  Result Value Ref Range   Sodium 138 135 - 145 mmol/L   Potassium 4.0 3.5 - 5.1 mmol/L   Chloride 110 101 - 111 mmol/L   CO2 21 (L) 22 - 32 mmol/L   Glucose, Bld 145 (H) 65 - 99 mg/dL   BUN 19 6 - 20 mg/dL   Creatinine, Ser 7.67 0.61 - 1.24 mg/dL   Calcium 8.7 (L) 8.9 - 10.3 mg/dL   Total Protein 7.6 6.5 - 8.1 g/dL   Albumin 3.7 3.5 - 5.0 g/dL   AST 24 15 - 41 U/L   ALT 15 (L) 17 - 63 U/L   Alkaline Phosphatase 83 38 - 126 U/L   Total Bilirubin 2.1 (H) 0.3 - 1.2 mg/dL   GFR calc non Af Amer >60 >60 mL/min   GFR calc Af Amer >60 >60 mL/min   Anion gap 7 5 - 15  Lipase, blood     Status: None   Collection Time: 06/07/15  7:49 AM  Result Value Ref Range   Lipase 26 11 - 51 U/L  CBC with Differential     Status: Abnormal   Collection Time: 06/07/15  7:49 AM  Result Value Ref Range   WBC 10.8 (H) 3.8 - 10.6 K/uL   RBC 4.37 (L) 4.40 - 5.90 MIL/uL   Hemoglobin 13.5 13.0 - 18.0 g/dL   HCT 20.9 (L) 47.0 - 96.2 %   MCV 89.6 80.0 - 100.0 fL   MCH 30.8 26.0 - 34.0 pg   MCHC 34.3 32.0 - 36.0 g/dL   RDW 83.6 (H) 62.9 - 47.6 %   Platelets 222 150 - 440 K/uL   Neutrophils Relative % 78 %   Neutro Abs 8.5 (H) 1.4 - 6.5 K/uL   Lymphocytes Relative 13 %   Lymphs Abs 1.4 1.0 - 3.6 K/uL   Monocytes Relative 7 %   Monocytes Absolute 0.8 0.2 - 1.0 K/uL   Eosinophils Relative 1 %   Eosinophils Absolute 0.1 0 - 0.7 K/uL   Basophils Relative 1 %   Basophils Absolute 0.1 0 - 0.1 K/uL  Troponin I     Status: None   Collection Time: 06/07/15  7:49 AM  Result Value Ref Range   Troponin I <0.03 <0.031 ng/mL  TSH     Status: None   Collection Time:  06/07/15  11:32 AM  Result Value Ref Range   TSH 1.051 0.350 - 4.500 uIU/mL  Troponin I     Status: None   Collection Time: 06/07/15 11:32 AM  Result Value Ref Range   Troponin I <0.03 <0.031 ng/mL   Dg Chest Portable 1 View  06/07/2015  CLINICAL DATA:  Chest pain and shortness of breath for 1 week. History of previous cardiac stenting. EXAM: PORTABLE CHEST 1 VIEW COMPARISON:  Chest x-ray dated 05/31/2010. FINDINGS: Moderate cardiomegaly is unchanged. There is central pulmonary vascular congestion and bilateral interstitial edema. Slightly more confluent airspace opacity at the right lung base suggests early alveolar pulmonary edema. No pleural effusion seen. No pneumothorax. Osseous and soft tissue structures about the chest are unremarkable. IMPRESSION: Cardiomegaly with central pulmonary vascular congestion and bilateral interstitial edema, perhaps early alveolar pulmonary edema within the right lung, consistent with congestive heart failure. Electronically Signed   By: Bary Richard [name]   On: 06/07/2015 08:05    ECG: Sinus rhyhtm rate 70 bpm.  R axis deviation.  Prior inferior infarct.  LHC 11/26/05:  LM 20%, 30-40% LAD, 80% D2, 50-60% LCx.  L-->R collaterals to the RCA.  PCI on the D2 lesion.  LHC 05/31/10: 0%LM, 100% mid LAD (10% post PCI), 100% RCA  Echo : EF 45%.  Moderate LVH.     ASSESSMENT/PLAN:  # Shortness of breath, acute on chronic systolic heart failure: [name] does not appear to be volume overloaded on exam. He does have a history of heart failure with his last ejection fraction noted as 45% at [facility] in 2011. His symptoms of orthopnea are concerning, though he does not have much lower extremity edema.  He does have mild right basilar crackles. - echo pending - lasix 40 mg IV bid - Check BNP  # CAD s/p PCI, chest pain: [name]' chest pain is somewhat atypical, but quite concerning given his cardiac history. He has a known 100% occlusion of the RCA and is status post PCI  of the LAD and diagonal. He has moderate stenosis of the left circumflex artery as well.  - Cycle cardiac enzymes.  If negative, will obtain inpatient stress test. If positive, will refer for cardiac catheterization.  - Switch aspirin to 81 mg daily - Continue home plavix, carvedilol and isordil - Check lipids and start atorvastatin 80 mg daily, as he has known CAD  # Hypertension: BP poorly-controlled.    - Start lisinopril 20 mg daily.  - Continue carvedilol   Signed: Silas Muff C. [name] Salvia, MD, [facility]  06/07/2015, 3:15 PM

## 2015-06-07 NOTE — Progress Notes (Signed)
Patient was noted to be off telemetry, RN notified by TC. RN and NT went to assess why patient may be off his telemetry monitor. Patient was noted to be in the shower, with IV in place (unwrapped) and tele box laying by the sink. The patient was asked to get out of the shower and educated about IV maintenance and the need for tele monitoring. Patient stated, "I'm already wet now" and continued to remain in the shower.

## 2015-06-07 NOTE — ED Notes (Signed)
Patient c/o chest pain that started 1 week ago, says that the pain feel like the last time when he had his stents placed at [facility]. Patient also c/o shortness of breath, nausea, and back pain. Patient denies radiation of pain.

## 2015-06-08 LAB — BASIC METABOLIC PANEL
Anion gap: 6 (ref 5–15)
Anion gap: 9 (ref 5–15)
BUN: 21 mg/dL — ABNORMAL HIGH (ref 6–20)
BUN: 23 mg/dL — ABNORMAL HIGH (ref 6–20)
CALCIUM: 8.7 mg/dL — AB (ref 8.9–10.3)
CHLORIDE: 105 mmol/L (ref 101–111)
CO2: 24 mmol/L (ref 22–32)
CO2: 26 mmol/L (ref 22–32)
CREATININE: 1.06 mg/dL (ref 0.61–1.24)
CREATININE: 1.21 mg/dL (ref 0.61–1.24)
Calcium: 8.7 mg/dL — ABNORMAL LOW (ref 8.9–10.3)
Chloride: 106 mmol/L (ref 101–111)
GFR calc Af Amer: >60 mL/min (ref >60)
GFR calc non Af Amer: >60 mL/min (ref >60)
GLUCOSE: 179 mg/dL — AB (ref 65–99)
Glucose, Bld: 170 mg/dL — ABNORMAL HIGH (ref 65–99)
Potassium: 3.5 mmol/L (ref 3.5–5.1)
Potassium: 3.7 mmol/L (ref 3.5–5.1)
SODIUM: 138 mmol/L (ref 135–145)
Sodium: 138 mmol/L (ref 135–145)

## 2015-06-08 LAB — LIPID PANEL
Cholesterol: 119 mg/dL (ref 0–200)
HDL: 25 mg/dL — ABNORMAL LOW (ref >40)
LDL CALC: 60 mg/dL (ref 0–99)
Total CHOL/HDL Ratio: 4.8 RATIO
Triglycerides: 171 mg/dL — ABNORMAL HIGH (ref <150)
VLDL: 34 mg/dL (ref 0–40)

## 2015-06-08 LAB — GLUCOSE, CAPILLARY
GLUCOSE-CAPILLARY: 178 mg/dL — AB (ref 65–99)
Glucose-Capillary: 113 mg/dL — ABNORMAL HIGH (ref 65–99)

## 2015-06-08 LAB — TROPONIN I

## 2015-06-08 MED ORDER — OXYCODONE-ACETAMINOPHEN 5-325 MG PO TABS
1.0000 | ORAL_TABLET | ORAL | Status: DC | PRN
  Administered 2015-06-08 – 2015-06-11 (×7): 1 via ORAL
  Filled 2015-06-08 (×6): qty 1

## 2015-06-08 MED ORDER — LOSARTAN POTASSIUM 50 MG PO TABS
50.0000 mg | ORAL_TABLET | Freq: Every day | ORAL | Status: DC
  Administered 2015-06-08: 50 mg via ORAL
  Filled 2015-06-08: qty 1

## 2015-06-08 MED ORDER — LOSARTAN POTASSIUM 25 MG PO TABS
25.0000 mg | ORAL_TABLET | Freq: Every day | ORAL | Status: DC
  Administered 2015-06-09 – 2015-06-11 (×3): 25 mg via ORAL
  Filled 2015-06-08 (×3): qty 1

## 2015-06-08 MED ORDER — FUROSEMIDE 10 MG/ML IJ SOLN
40.0000 mg | Freq: Two times a day (BID) | INTRAMUSCULAR | Status: DC
  Administered 2015-06-08 – 2015-06-10 (×4): 40 mg via INTRAVENOUS
  Filled 2015-06-08 (×4): qty 4

## 2015-06-08 MED ORDER — CARVEDILOL 12.5 MG PO TABS
12.5000 mg | ORAL_TABLET | Freq: Two times a day (BID) | ORAL | Status: DC
  Administered 2015-06-08: 12.5 mg via ORAL
  Filled 2015-06-08: qty 1

## 2015-06-08 MED ORDER — DIGOXIN 250 MCG PO TABS
0.2500 mg | ORAL_TABLET | Freq: Every day | ORAL | Status: DC
  Administered 2015-06-08: 0.25 mg via ORAL
  Filled 2015-06-08: qty 1

## 2015-06-08 MED ORDER — IPRATROPIUM-ALBUTEROL 0.5-2.5 (3) MG/3ML IN SOLN
3.0000 mL | RESPIRATORY_TRACT | Status: DC | PRN
  Administered 2015-06-08: 3 mL via RESPIRATORY_TRACT
  Filled 2015-06-08: qty 3

## 2015-06-08 NOTE — Progress Notes (Signed)
[facility] - [place] at [facility]                                                                                                                                                                                            Patient Demographics   [name], is a 59 y.o. male, DOB - 04-03-1956, ZOX:[id number]  Admit date - 06/07/2015   Admitting Physician Auburn Bilberry, MD  Outpatient Primary MD for the patient is No primary care provider on file.   LOS - 1  Subjective: Patient's breathing is improved but was complaining of having some blurred vision earlier no chest pain or chest pressure     Review of Systems:   CONSTITUTIONAL: No documented fever. No fatigue, weakness. No weight gain, no weight loss.  EYES: Positive blurry or no double vision.  ENT: No tinnitus. No postnasal drip. No redness of the oropharynx.  RESPIRATORY: No cough, no wheeze, no hemoptysis. Positive dyspnea.  CARDIOVASCULAR: No chest pain. No orthopnea. No palpitations. No syncope.  GASTROINTESTINAL: No nausea, no vomiting or diarrhea. No abdominal pain. No melena or hematochezia.  GENITOURINARY: No dysuria or hematuria.  ENDOCRINE: No polyuria or nocturia. No heat or cold intolerance.  HEMATOLOGY: No anemia. No bruising. No bleeding.  INTEGUMENTARY: No rashes. No lesions.  MUSCULOSKELETAL: No arthritis. No swelling. No gout.  NEUROLOGIC: No numbness, tingling, or ataxia. No seizure-type activity.  PSYCHIATRIC: No anxiety. No insomnia. No ADD.    Vitals:   Filed Vitals:   06/08/15 0529 06/08/15 0725 06/08/15 1129 06/08/15 1200  BP: 135/70 125/63 100/54   Pulse: 54 78 43 58  Temp: 98.5 F (36.9 C)  97.5 F (36.4 C)   TempSrc: Oral  Oral   Resp: Height:      Weight:      SpO2: 91% 93% 97%     Wt Readings from Last 3 Encounters:  06/07/15 96.979 kg (213 lb 12.8 oz)     Intake/Output Summary (Last 24 hours) at 06/08/15 1331 Last data filed at 06/08/15 1233   Gross per 24 hour  Intake    360 ml  Output    400 ml  Net    -40 ml    Physical Exam:   GENERAL: Pleasant-appearing in no apparent distress.  HEAD, EYES, EARS, NOSE AND THROAT: Atraumatic, normocephalic. Extraocular muscles are intact. Pupils equal and reactive to light. Sclerae anicteric. No conjunctival injection. No oro-pharyngeal erythema.  NECK: Supple. There is no jugular venous distention. No bruits, no lymphadenopathy, no thyromegaly.  HEART: Regular rate and rhythm,. No murmurs, no rubs, no clicks.  LUNGS: Bilateral crackles at the bases  ABDOMEN: Soft, flat, nontender, nondistended. Has good bowel sounds. No hepatosplenomegaly appreciated.  EXTREMITIES: No evidence of any cyanosis, clubbing, or peripheral edema.  +2 pedal and radial pulses bilaterally.  NEUROLOGIC: The patient is alert, awake, and oriented x3 with no focal motor or sensory deficits appreciated bilaterally.  SKIN: Moist and warm with no rashes appreciated.  Psych: Not anxious, depressed LN: No inguinal LN enlargement    Antibiotics   Anti-infectives    None      Medications   Scheduled Meds: . aspirin  81 mg Oral Daily  . atorvastatin  80 mg Oral q1800  . carvedilol  12.5 mg Oral BID WC  . clopidogrel  75 mg Oral Daily  . enoxaparin (LOVENOX) injection  40 mg Subcutaneous Q24H  . furosemide  40 mg Intravenous Q12H  . isosorbide dinitrate  30 mg Oral QID  . [START ON 06/09/2015] losartan  25 mg Oral Daily   Continuous Infusions:  PRN Meds:.acetaminophen **OR** acetaminophen, nitroGLYCERIN, ondansetron **OR** ondansetron (ZOFRAN) IV, sodium chloride   Data Review:   Micro Results No results found for this or any previous visit (from the past 240 hour(s)).  Radiology Reports Dg Chest Portable 1 View  06/07/2015  CLINICAL DATA:  Chest pain and shortness of breath for 1 week. History of previous cardiac stenting. EXAM: PORTABLE CHEST 1 VIEW COMPARISON:  Chest x-ray dated 05/31/2010. FINDINGS:  Moderate cardiomegaly is unchanged. There is central pulmonary vascular congestion and bilateral interstitial edema. Slightly more confluent airspace opacity at the right lung base suggests early alveolar pulmonary edema. No pleural effusion seen. No pneumothorax. Osseous and soft tissue structures about the chest are unremarkable. IMPRESSION: Cardiomegaly with central pulmonary vascular congestion and bilateral interstitial edema, perhaps early alveolar pulmonary edema within the right lung, consistent with congestive heart failure. Electronically Signed   By: Bary Richard [name]   On: 06/07/2015 08:05     CBC  Recent Labs Lab 06/07/15 0749 06/07/15 2334  WBC 10.8* 9.5  HGB 13.5 13.7  HCT 39.2* 39.2*  PLT 222 246  MCV 89.6 89.2  MCH 30.8 31.0  MCHC 34.3 34.8  RDW 15.7* 15.7*  LYMPHSABS 1.4  --   MONOABS 0.8  --   EOSABS 0.1  --   BASOSABS 0.1  --     Chemistries   Recent Labs Lab 06/07/15 0749 06/07/15 2334 06/08/15 0942  NA 138 138 138  K 4.0 3.5 3.7  CL 110 106 105  CO2 21* 26 24  GLUCOSE 145* 179* 170*  BUN 19 23* 21*  CREATININE 1.04 1.21 1.06  CALCIUM 8.7* 8.7* 8.7*  AST 24  --   --   ALT 15*  --   --   ALKPHOS 83  --   --   BILITOT 2.1*  --   --    ------------------------------------------------------------------------------------------------------------------ estimated creatinine clearance is 82.8 mL/min (by C-G formula based on Cr of 1.06). ------------------------------------------------------------------------------------------------------------------ No results for input(s): HGBA1C in the last 72 hours. ------------------------------------------------------------------------------------------------------------------  Recent Labs  06/07/15 2334  CHOL 119  HDL 25*  LDLCALC 60  TRIG 161*  CHOLHDL 4.8   ------------------------------------------------------------------------------------------------------------------  Recent Labs  06/07/15 1132   TSH 1.051   ------------------------------------------------------------------------------------------------------------------ No results for input(s): VITAMINB12, FOLATE, FERRITIN, TIBC, IRON, RETICCTPCT in the last 72 hours.  Coagulation profile No results for input(s): INR, PROTIME in the last 168 hours.  No results for input(s): DDIMER in the last 72 hours.  Cardiac Enzymes  Recent Labs Lab 06/07/15 1132 06/07/15 1709 06/07/15 2334  TROPONINI <0.03 <0.03 <0.03   ------------------------------------------------------------------------------------------------------------------ Invalid input(s): POCBNP    Assessment & Plan   IMPRESSION AND PLAN: Patient is a 58 year old white male with history of coronary artery disease presents with shortness of breath  1. Acute systolic CHF: Continue IV Lasix patient's breathing seems to have improved  2. Chest pressure: Appreciate cardiology input due to worsening ejection fraction plan for cardiac catheter per cardiology   3. Hypertension blood pressure little lower decreased dose of Coreg and losartan.  4. Miscellaneous Lovenox for DVT prophylaxis   All the records are reviewed and case discussed with ED provider.     Code Status Orders        Start     Ordered   06/07/15 1122  Full code   Continuous     06/07/15 1121           Consults  cardiology DVT Prophylaxis  Lovenox   Lab Results  Component Value Date   PLT 246 06/07/2015     Time Spent in minutes   35 minutes Auburn Bilberry M.D on 06/08/2015 at 1:31 PM  Between 7am to 6pm - Pager - [phone]  After 6pm go to [email or web address] - password [email or web address] [facility]  [facility]   Office  [phone]

## 2015-06-08 NOTE — Progress Notes (Signed)
Pt c/o of increased sob/ RN to bedside to assess/ o2 sats 95% 2L/ vss/ wheezing heard/ MD paged/ orders received/will continue to assess

## 2015-06-08 NOTE — Progress Notes (Signed)
Patient: [name]. / Admit Date: 06/07/2015 / Date of Encounter: 06/08/2015, 9:27 AM   Subjective: SOB. No chest pain since last evening. Echo showed newly depressed EF of 20-25% from 45%.   Review of Systems: Review of Systems  Constitutional: Positive for malaise/fatigue. Negative for fever, chills, weight loss and diaphoresis.  HENT: Negative for congestion.   Eyes: Negative for discharge and redness.  Respiratory: Positive for shortness of breath. Negative for cough, hemoptysis, sputum production and wheezing.   Cardiovascular: Positive for chest pain and PND. Negative for palpitations, orthopnea, claudication and leg swelling.  Gastrointestinal: Negative for nausea and vomiting.  Musculoskeletal: Negative for falls.  Skin: Negative for rash.  Neurological: Positive for weakness. Negative for sensory change, speech change, focal weakness and loss of consciousness.  Endo/Heme/Allergies: Does not bruise/bleed easily.  Psychiatric/Behavioral: The patient is not nervous/anxious.     Objective: Telemetry: sinus brady 55 bpm Physical Exam: Blood pressure 135/70, pulse 54, temperature 98.5 F (36.9 C), temperature source Oral, resp. rate 22, height 5\' 6"  (1.676 m), weight 213 lb 12.8 oz (96.979 kg), SpO2 91 %. Body mass index is 34.52 kg/(m^2). General: Well developed, well nourished, in no acute distress. Head: Normocephalic, atraumatic, sclera non-icteric, no xanthomas, nares are without discharge. Neck: Negative for carotid bruits. JVP not elevated. Lungs: Bilateral crackles 1/2 up. Breathing is unlabored. Heart: Bradycardic, S1 S2 without murmurs, rubs, or gallops.  Abdomen: Soft, non-tender, non-distended with normoactive bowel sounds. No rebound/guarding. Extremities: No clubbing or cyanosis. No edema. Distal pedal pulses are 2+ and equal bilaterally. Neuro: Alert and oriented X 3. Moves all extremities spontaneously. Psych:  Responds to questions appropriately with a  normal affect.   Intake/Output Summary (Last 24 hours) at 06/08/15 0927 Last data filed at 06/08/15 0811  Gross per 24 hour  Intake    480 ml  Output    800 ml  Net   -320 ml    Inpatient Medications:  . aspirin  81 mg Oral Daily  . atorvastatin  80 mg Oral q1800  . carvedilol  25 mg Oral BID WC  . clopidogrel  75 mg Oral Daily  . digoxin  0.25 mg Oral Daily  . enoxaparin (LOVENOX) injection  40 mg Subcutaneous Q24H  . furosemide  40 mg Intravenous Q12H  . isosorbide dinitrate  30 mg Oral QID  . lisinopril  20 mg Oral Daily   Infusions:    Labs:  Recent Labs  06/07/15 0749 06/07/15 2334  NA 138 138  K 4.0 3.5  CL 110 106  CO2 21* 26  GLUCOSE 145* 179*  BUN 19 23*  CREATININE 1.04 1.21  CALCIUM 8.7* 8.7*    Recent Labs  06/07/15 0749  AST 24  ALT 15*  ALKPHOS 83  BILITOT 2.1*  PROT 7.6  ALBUMIN 3.7    Recent Labs  06/07/15 0749 06/07/15 2334  WBC 10.8* 9.5  NEUTROABS 8.5*  --   HGB 13.5 13.7  HCT 39.2* 39.2*  MCV 89.6 89.2  PLT 222 246    Recent Labs  06/07/15 0749 06/07/15 1132 06/07/15 1709 06/07/15 2334  TROPONINI <0.03 <0.03 <0.03 <0.03   Invalid input(s): POCBNP No results for input(s): HGBA1C in the last 72 hours.   Weights: Filed Weights   06/07/15 0736 06/07/15 1049  Weight: 220 lb 0.3 oz (99.8 kg) 213 lb 12.8 oz (96.979 kg)     Radiology/Studies:  Dg Chest Portable 1 View  06/07/2015  CLINICAL DATA:  Chest  pain and shortness of breath for 1 week. History of previous cardiac stenting. EXAM: PORTABLE CHEST 1 VIEW COMPARISON:  Chest x-ray dated 05/31/2010. FINDINGS: Moderate cardiomegaly is unchanged. There is central pulmonary vascular congestion and bilateral interstitial edema. Slightly more confluent airspace opacity at the right lung base suggests early alveolar pulmonary edema. No pleural effusion seen. No pneumothorax. Osseous and soft tissue structures about the chest are unremarkable. IMPRESSION: Cardiomegaly with  central pulmonary vascular congestion and bilateral interstitial edema, perhaps early alveolar pulmonary edema within the right lung, consistent with congestive heart failure. Electronically Signed   By: Bary Richard [name]   On: 06/07/2015 08:05     Assessment and Plan   1. Shortness of breath, acute on chronic combined systolic and diastolic heart failure: Mr. Mihalik does not appear to be volume overloaded on exam. He does have a history of heart failure with his last ejection fraction noted as 45% at [facility] in 2011. His symptoms of orthopnea are concerning, though he does not have much lower extremity edema. He notes worsening SOB this morning. Unable to lay fully supine.    - Echo showed newly depressed EF of 20-25% from prior of 45% - Continue Lasix 40 mg IV bid, minus 800 mL for the past 24 hours and 320 mL for the admission - Plan to switch lisinopril to losartan today to begin 36 hour wash out of ACEi followed by initiation of Entresto in the morning of 12/6, pending holding of medications for cardiac cath below  - BNP moderately elevated at 871  2. CAD s/p PCI, chest pain: Mr. Mcauliffe' chest pain is somewhat atypical, but quite concerning given his cardiac history. He has a known 100% occlusion of the RCA and is status post PCI of the LAD and diagonal. He has moderate stenosis of the left circumflex artery as well.  - Troponin negative x 3 - Echo showed newly depressed EF of 20-25% from prior EF of 45% in 2011 - Given this change he would benefit from inpatient cardiac cath once he has diuresed as above. Can reassess on the morning of 12/5  - Switch aspirin to 81 mg daily - Continue home plavix, carvedilol and isordil - Continue atorvastatin 80 mg daily, as he has known CAD  3. Hypertension: BP improved - Change lisinopril to losartan as above to begin 36 hour wash out for [employer] - Continue carvedilol   Elinor Dodge, PA-C Pager: [phone] 06/08/2015, 9:27 AM

## 2015-06-09 NOTE — Progress Notes (Signed)
Patient was made an initial appointment at the [facility] on July 03, 2015 at 9:00am. Thank you.

## 2015-06-09 NOTE — Progress Notes (Signed)
[facility] - [place] at [facility]                                                                                                                                                                                            Patient Demographics   [name], is a 59 y.o. male, DOB - April 06, 1956, [id number]  Admit date - 06/07/2015   Admitting Physician Auburn Bilberry, MD  Outpatient Primary MD for the patient is No primary care provider on file.   LOS - 2  Subjective: Continues to complaint of some blurred vision and feeling dizzy and his heart rate but has been low in the 40s   Review of Systems:   CONSTITUTIONAL: No documented fever. No fatigue, weakness. No weight gain, no weight loss.  EYES: Positive blurry or no double vision.  ENT: No tinnitus. No postnasal drip. No redness of the oropharynx.  RESPIRATORY: No cough, no wheeze, no hemoptysis. Positive dyspnea.  CARDIOVASCULAR: No chest pain. No orthopnea. No palpitations. No syncope.  GASTROINTESTINAL: No nausea, no vomiting or diarrhea. No abdominal pain. No melena or hematochezia.  GENITOURINARY: No dysuria or hematuria.  ENDOCRINE: No polyuria or nocturia. No heat or cold intolerance.  HEMATOLOGY: No anemia. No bruising. No bleeding.  INTEGUMENTARY: No rashes. No lesions.  MUSCULOSKELETAL: No arthritis. No swelling. No gout.  NEUROLOGIC: No numbness, tingling, or ataxia. No seizure-type activity.  PSYCHIATRIC: No anxiety. No insomnia. No ADD.    Vitals:   Filed Vitals:   06/09/15 0423 06/09/15 0730 06/09/15 1130 06/09/15 1131  BP: 119/64 129/82 132/72   Pulse: 45 48  47  Temp: 97.8 F (36.6 C)  97.8 F (36.6 C)   TempSrc: Oral  Oral   Resp: Height:      Weight:      SpO2: 98% 92%  95%    Wt Readings from Last 3 Encounters:  06/07/15 96.979 kg (213 lb 12.8 oz)     Intake/Output Summary (Last 24 hours) at 06/09/15 1506 Last data filed at 06/09/15 1300  Gross per 24 hour   Intake      0 ml  Output   1305 ml  Net  -1305 ml    Physical Exam:   GENERAL: Pleasant-appearing in no apparent distress.  HEAD, EYES, EARS, NOSE AND THROAT: Atraumatic, normocephalic. Extraocular muscles are intact. Pupils equal and reactive to light. Sclerae anicteric. No conjunctival injection. No oro-pharyngeal erythema.  NECK: Supple. There is no jugular venous distention. No bruits, no lymphadenopathy, no thyromegaly.  HEART: Regular rate and rhythm,. No murmurs, no rubs, no clicks.  LUNGS:  Bilateral crackles at the bases  ABDOMEN: Soft, flat, nontender, nondistended. Has good bowel sounds. No hepatosplenomegaly appreciated.  EXTREMITIES: No evidence of any cyanosis, clubbing, or peripheral edema.  +2 pedal and radial pulses bilaterally.  NEUROLOGIC: The patient is alert, awake, and oriented x3 with no focal motor or sensory deficits appreciated bilaterally.  SKIN: Moist and warm with no rashes appreciated.  Psych: Not anxious, depressed LN: No inguinal LN enlargement    Antibiotics   Anti-infectives    None      Medications   Scheduled Meds: . aspirin  81 mg Oral Daily  . atorvastatin  80 mg Oral q1800  . clopidogrel  75 mg Oral Daily  . enoxaparin (LOVENOX) injection  40 mg Subcutaneous Q24H  . furosemide  40 mg Intravenous Q12H  . isosorbide dinitrate  30 mg Oral QID  . losartan  25 mg Oral Daily   Continuous Infusions:  PRN Meds:.acetaminophen **OR** acetaminophen, ipratropium-albuterol, nitroGLYCERIN, ondansetron **OR** ondansetron (ZOFRAN) IV, oxyCODONE-acetaminophen, sodium chloride   Data Review:   Micro Results No results found for this or any previous visit (from the past 240 hour(s)).  Radiology Reports Dg Chest Portable 1 View  06/07/2015  CLINICAL DATA:  Chest pain and shortness of breath for 1 week. History of previous cardiac stenting. EXAM: PORTABLE CHEST 1 VIEW COMPARISON:  Chest x-ray dated 05/31/2010. FINDINGS: Moderate cardiomegaly is  unchanged. There is central pulmonary vascular congestion and bilateral interstitial edema. Slightly more confluent airspace opacity at the right lung base suggests early alveolar pulmonary edema. No pleural effusion seen. No pneumothorax. Osseous and soft tissue structures about the chest are unremarkable. IMPRESSION: Cardiomegaly with central pulmonary vascular congestion and bilateral interstitial edema, perhaps early alveolar pulmonary edema within the right lung, consistent with congestive heart failure. Electronically Signed   By: Bary Richard [name]   On: 06/07/2015 08:05     CBC  Recent Labs Lab 06/07/15 0749 06/07/15 2334  WBC 10.8* 9.5  HGB 13.5 13.7  HCT 39.2* 39.2*  PLT 222 246  MCV 89.6 89.2  MCH 30.8 31.0  MCHC 34.3 34.8  RDW 15.7* 15.7*  LYMPHSABS 1.4  --   MONOABS 0.8  --   EOSABS 0.1  --   BASOSABS 0.1  --     Chemistries   Recent Labs Lab 06/07/15 0749 06/07/15 2334 06/08/15 0942  NA 138 138 138  K 4.0 3.5 3.7  CL 110 106 105  CO2 21* 26 24  GLUCOSE 145* 179* 170*  BUN 19 23* 21*  CREATININE 1.04 1.21 1.06  CALCIUM 8.7* 8.7* 8.7*  AST 24  --   --   ALT 15*  --   --   ALKPHOS 83  --   --   BILITOT 2.1*  --   --    ------------------------------------------------------------------------------------------------------------------ estimated creatinine clearance is 82.8 mL/min (by C-G formula based on Cr of 1.06). ------------------------------------------------------------------------------------------------------------------ No results for input(s): HGBA1C in the last 72 hours. ------------------------------------------------------------------------------------------------------------------  Recent Labs  06/07/15 2334  CHOL 119  HDL 25*  LDLCALC 60  TRIG 161*  CHOLHDL 4.8   ------------------------------------------------------------------------------------------------------------------  Recent Labs  06/07/15 1132  TSH 1.051    ------------------------------------------------------------------------------------------------------------------ No results for input(s): VITAMINB12, FOLATE, FERRITIN, TIBC, IRON, RETICCTPCT in the last 72 hours.  Coagulation profile No results for input(s): INR, PROTIME in the last 168 hours.  No results for input(s): DDIMER in the last 72 hours.  Cardiac Enzymes  Recent Labs Lab 06/07/15 1132 06/07/15 1709 06/07/15 2334  TROPONINI <  0.03 <0.03 <0.03   ------------------------------------------------------------------------------------------------------------------ Invalid input(s): POCBNP    Assessment & Plan   IMPRESSION AND PLAN: Patient is a 59 year old white male with history of coronary artery disease presents with shortness of breath  1. Acute systolic CHF: Continue IV Lasix continues to improve  2. Chest pressure: Appreciate cardiology input due to worsening ejection fraction plan for cardiac catheter per cardiology once able to lay flat   3. Hypertension blood pressure continue losartan discontinue Coreg due to bradycardia  4. Blurred vision and weakness and dizziness possibly related to low heart rate. Coreg  5. Miscellaneous Lovenox for DVT prophylaxis   All the records are reviewed and case discussed with ED provider.     Code Status Orders        Start     Ordered   06/07/15 1122  Full code   Continuous     06/07/15 1121           Consults  cardiology DVT Prophylaxis  Lovenox   Lab Results  Component Value Date   PLT 246 06/07/2015     Time Spent in minutes 32 minutes Auburn Bilberry M.D on 06/09/2015 at 3:06 PM  Between 7am to 6pm - Pager - [phone]  After 6pm go to [email or web address] - password [email or web address] [facility]  [facility]   Office  [phone]

## 2015-06-09 NOTE — Progress Notes (Signed)
Patient: [name]. / Admit Date: 06/07/2015 / Date of Encounter: 06/09/2015, 10:59 AM   Subjective: Feeling better today with better breathing. Ambulated with less SOB. Remains lightheaded. Able to lay supine. Minus 1205 mL for the past 24 hours and 1565 mL for the admission.   Review of Systems: Review of Systems  Constitutional: Positive for malaise/fatigue. Negative for fever, chills, weight loss and diaphoresis.  HENT: Negative for congestion.   Eyes: Negative for discharge and redness.  Respiratory: Positive for cough and shortness of breath. Negative for hemoptysis, sputum production and wheezing.   Cardiovascular: Negative for chest pain, palpitations, orthopnea, claudication, leg swelling and PND.  Gastrointestinal: Negative for nausea and vomiting.  Musculoskeletal: Negative for falls.  Skin: Negative for rash.  Neurological: Positive for weakness. Negative for sensory change, speech change, focal weakness and loss of consciousness.  Endo/Heme/Allergies: Does not bruise/bleed easily.  Psychiatric/Behavioral: The patient is not nervous/anxious.      Objective: Telemetry: Sinus bradycardia, 40's Physical Exam: Blood pressure 129/82, pulse 48, temperature 97.8 F (36.6 C), temperature source Oral, resp. rate 18, height 5\' 6"  (1.676 m), weight 213 lb 12.8 oz (96.979 kg), SpO2 92 %. Body mass index is 34.52 kg/(m^2). General: Well developed, well nourished, in no acute distress. Head: Normocephalic, atraumatic, sclera non-icteric, no xanthomas, nares are without discharge. Neck: Negative for carotid bruits. JVP not elevated. Lungs: Faint crackles bilateral bases. Breathing is unlabored. Heart: Bradycardic, S1 S2 without murmurs, rubs, or gallops.  Abdomen: Soft, non-tender, non-distended with normoactive bowel sounds. No rebound/guarding. Extremities: No clubbing or cyanosis. No edema. Distal pedal pulses are 2+ and equal bilaterally. Neuro: Alert and oriented X 3.  Moves all extremities spontaneously. Psych:  Responds to questions appropriately with a normal affect.   Intake/Output Summary (Last 24 hours) at 06/09/15 1059 Last data filed at 06/09/15 1009  Gross per 24 hour  Intake    240 ml  Output   1605 ml  Net  -1365 ml    Inpatient Medications:  . aspirin  81 mg Oral Daily  . atorvastatin  80 mg Oral q1800  . carvedilol  12.5 mg Oral BID WC  . clopidogrel  75 mg Oral Daily  . enoxaparin (LOVENOX) injection  40 mg Subcutaneous Q24H  . furosemide  40 mg Intravenous Q12H  . isosorbide dinitrate  30 mg Oral QID  . losartan  25 mg Oral Daily   Infusions:    Labs:  Recent Labs  06/07/15 2334 06/08/15 0942  NA 138 138  K 3.5 3.7  CL 106 105  CO2 26 24  GLUCOSE 179* 170*  BUN 23* 21*  CREATININE 1.21 1.06  CALCIUM 8.7* 8.7*    Recent Labs  06/07/15 0749  AST 24  ALT 15*  ALKPHOS 83  BILITOT 2.1*  PROT 7.6  ALBUMIN 3.7    Recent Labs  06/07/15 0749 06/07/15 2334  WBC 10.8* 9.5  NEUTROABS 8.5*  --   HGB 13.5 13.7  HCT 39.2* 39.2*  MCV 89.6 89.2  PLT 222 246    Recent Labs  06/07/15 0749 06/07/15 1132 06/07/15 1709 06/07/15 2334  TROPONINI <0.03 <0.03 <0.03 <0.03   Invalid input(s): POCBNP No results for input(s): HGBA1C in the last 72 hours.   Weights: Filed Weights   06/07/15 0736 06/07/15 1049  Weight: 220 lb 0.3 oz (99.8 kg) 213 lb 12.8 oz (96.979 kg)     Radiology/Studies:  Dg Chest Portable 1 View  06/07/2015  CLINICAL DATA:  Chest pain and shortness of breath for 1 week. History of previous cardiac stenting. EXAM: PORTABLE CHEST 1 VIEW COMPARISON:  Chest x-ray dated 05/31/2010. FINDINGS: Moderate cardiomegaly is unchanged. There is central pulmonary vascular congestion and bilateral interstitial edema. Slightly more confluent airspace opacity at the right lung base suggests early alveolar pulmonary edema. No pleural effusion seen. No pneumothorax. Osseous and soft tissue structures about the  chest are unremarkable. IMPRESSION: Cardiomegaly with central pulmonary vascular congestion and bilateral interstitial edema, perhaps early alveolar pulmonary edema within the right lung, consistent with congestive heart failure. Electronically Signed   By: Bary Richard [name]   On: 06/07/2015 08:05     Assessment and Plan   1. Shortness of breath, acute on chronic combined systolic and diastolic heart failure: Mr. Arscott does not appear to be volume overloaded on exam. He does have a history of heart failure with his last ejection fraction noted as 45% at [facility] in 2011. His symptoms of orthopnea are concerning, though he does not have much lower extremity edema. He notes worsening SOB this morning. Unable to lay fully supine.   - Echo showed newly depressed EF of 20-25% from prior of 45% - Continue Lasix 40 mg IV bid, minus 1205 mL and 1565 mL for the admission - Continue losartan today as part of 36 hour wash out of ACEi followed by initiation of Entresto in the morning of 12/6, pending holding of medications for cardiac cath below  - BNP moderately elevated at 871  2. CAD s/p PCI, chest pain: Mr. Kozak' chest pain is somewhat atypical, but quite concerning given his cardiac history. He has a known 100% occlusion of the RCA and is status post PCI of the LAD and diagonal. He has moderate stenosis of the left circumflex artery as well.  - Troponin negative x 3 - Echo showed newly depressed EF of 20-25% from prior EF of 45% in 2011 - Given this change he would benefit from inpatient cardiac cath once he has diuresed as above - Switch aspirin to 81 mg daily - Continue home plavix, and isordil - Continue atorvastatin 80 mg daily, as he has known CAD - Restart Coreg when able  3. Hypertension: BP improved - Change lisinopril to losartan as above to begin 36 hour wash out for Entresto - Coreg held 2/2 bradycardia in the 40's and lightheadedness    Signed, Eula Listen, PA-C Pager: [phone] 06/09/2015, 10:59 AM

## 2015-06-09 NOTE — Progress Notes (Signed)
Initial Nutrition Assessment      INTERVENTION:   Meals and Snacks: Cater to patient preferences Education: pt declined diet education  NUTRITION DIAGNOSIS:   Limited adherence to nutrition-related recommendations related to chronic illness as evidenced by  (pt refused diet education, does not want to eat low sodium diet).  GOAL:   Patient will meet greater than or equal to 90% of their needs  MONITOR:    (Energy Intake, Anthropometrics, Digestive System, Electrolyte/Renal Profile)  REASON FOR ASSESSMENT:   Diagnosis    ASSESSMENT:    Pt admitted with acute CHF  Past Medical History  Diagnosis Date  . MI (myocardial infarction) (HCC)   . Hypertension      Diet Order:  Diet 2 gram sodium Room service appropriate?: Yes; Fluid consistency:: Thin   Energy Intake: pt with good appetite; recorded po intake 100% of meals. Pt does not like the low sodium diet or the food in the hospital. Pt reports he went [place] to [place] [employer] today to get lunch; reports he was not going to eat the mess that we sent him  Electrolyte and Renal Profile:  Recent Labs Lab 06/07/15 0749 06/07/15 2334 06/08/15 0942  BUN 19 23* 21*  CREATININE 1.04 1.21 1.06  NA 138 138 138  K 4.0 3.5 3.7   Glucose Profile:  Recent Labs  06/08/15 1130 06/08/15 1913  GLUCAP 178* 113*   Meds: lasix  Height:   Ht Readings from Last 1 Encounters:  06/07/15 5\' 6"  (1.676 m)    Weight:   Wt Readings from Last 1 Encounters:  06/07/15 213 lb 12.8 oz (96.979 kg)    BMI:  Body mass index is 34.52 kg/(m^2).  Will sign off; please re-consult RD if assistance is needed   Romelle Starcher MS, RD, LDN [phone] Pager

## 2015-06-10 ENCOUNTER — Encounter: Payer: Self-pay | Admitting: Nurse Practitioner

## 2015-06-10 ENCOUNTER — Encounter
Admission: EM | Disposition: A | Discharge status: Home / Self Care | Payer: Self-pay | Source: Home / Self Care | Attending: Internal Medicine

## 2015-06-10 ENCOUNTER — Telehealth: Payer: Self-pay

## 2015-06-10 DIAGNOSIS — I5023 Acute on chronic systolic (congestive) heart failure: Secondary | ICD-10-CM

## 2015-06-10 DIAGNOSIS — I25111 Atherosclerotic heart disease of native coronary artery with angina pectoris with documented spasm: Secondary | ICD-10-CM

## 2015-06-10 DIAGNOSIS — I200 Unstable angina: Secondary | ICD-10-CM

## 2015-06-10 DIAGNOSIS — I25118 Atherosclerotic heart disease of native coronary artery with other forms of angina pectoris: Secondary | ICD-10-CM

## 2015-06-10 DIAGNOSIS — I2510 Atherosclerotic heart disease of native coronary artery without angina pectoris: Secondary | ICD-10-CM

## 2015-06-10 DIAGNOSIS — I5021 Acute systolic (congestive) heart failure: Secondary | ICD-10-CM | POA: Insufficient documentation

## 2015-06-10 DIAGNOSIS — I10 Essential (primary) hypertension: Secondary | ICD-10-CM | POA: Diagnosis present

## 2015-06-10 DIAGNOSIS — E785 Hyperlipidemia, unspecified: Secondary | ICD-10-CM | POA: Insufficient documentation

## 2015-06-10 HISTORY — PX: CARDIAC CATHETERIZATION: SHX172

## 2015-06-10 LAB — BASIC METABOLIC PANEL
Anion gap: 8 (ref 5–15)
BUN: 19 mg/dL (ref 6–20)
CALCIUM: 8.4 mg/dL — AB (ref 8.9–10.3)
CHLORIDE: 101 mmol/L (ref 101–111)
CO2: 26 mmol/L (ref 22–32)
CREATININE: 0.92 mg/dL (ref 0.61–1.24)
GFR calc non Af Amer: >60 mL/min (ref >60)
Glucose, Bld: 187 mg/dL — ABNORMAL HIGH (ref 65–99)
Potassium: 3.4 mmol/L — ABNORMAL LOW (ref 3.5–5.1)
SODIUM: 135 mmol/L (ref 135–145)

## 2015-06-10 SURGERY — LEFT HEART CATH
Anesthesia: Moderate Sedation

## 2015-06-10 MED ORDER — FUROSEMIDE 40 MG PO TABS
40.0000 mg | ORAL_TABLET | Freq: Two times a day (BID) | ORAL | Status: DC
  Administered 2015-06-10 – 2015-06-11 (×3): 40 mg via ORAL
  Filled 2015-06-10 (×3): qty 1

## 2015-06-10 MED ORDER — FENTANYL CITRATE (PF) 100 MCG/2ML IJ SOLN
INTRAMUSCULAR | Status: AC
  Filled 2015-06-10: qty 2

## 2015-06-10 MED ORDER — VERAPAMIL HCL 2.5 MG/ML IV SOLN
INTRAVENOUS | Status: AC
  Filled 2015-06-10: qty 2

## 2015-06-10 MED ORDER — SODIUM CHLORIDE 0.9 % IV SOLN: INTRAVENOUS | Status: DC

## 2015-06-10 MED ORDER — OXYCODONE-ACETAMINOPHEN 5-325 MG PO TABS
ORAL_TABLET | ORAL | Status: AC
  Filled 2015-06-10: qty 1

## 2015-06-10 MED ORDER — DIPHENHYDRAMINE HCL 25 MG PO CAPS: 25.0000 mg | ORAL_CAPSULE | Freq: Four times a day (QID) | ORAL | Status: DC | PRN

## 2015-06-10 MED ORDER — SODIUM CHLORIDE 0.9 % IJ SOLN
3.0000 mL | Freq: Two times a day (BID) | INTRAMUSCULAR | Status: DC
  Administered 2015-06-11: 3 mL via INTRAVENOUS

## 2015-06-10 MED ORDER — HEPARIN (PORCINE) IN NACL 2-0.9 UNIT/ML-% IJ SOLN
INTRAMUSCULAR | Status: AC
  Filled 2015-06-10: qty 1000

## 2015-06-10 MED ORDER — IOHEXOL 300 MG/ML  SOLN
INTRAMUSCULAR | Status: DC | PRN
  Administered 2015-06-10: 105 mL via INTRA_ARTERIAL

## 2015-06-10 MED ORDER — HEPARIN SODIUM (PORCINE) 1000 UNIT/ML IJ SOLN
INTRAMUSCULAR | Status: DC | PRN
  Administered 2015-06-10: 4000 [IU] via INTRAVENOUS

## 2015-06-10 MED ORDER — SODIUM CHLORIDE 0.9 % IJ SOLN: 3.0000 mL | INTRAMUSCULAR | Status: DC | PRN

## 2015-06-10 MED ORDER — MIDAZOLAM HCL 2 MG/2ML IJ SOLN
INTRAMUSCULAR | Status: AC
  Filled 2015-06-10: qty 2

## 2015-06-10 MED ORDER — SODIUM CHLORIDE 0.9 % IV SOLN
INTRAVENOUS | Status: AC
  Administered 2015-06-10: 15:00:00 via INTRAVENOUS

## 2015-06-10 MED ORDER — POTASSIUM CHLORIDE CRYS ER 20 MEQ PO TBCR
20.0000 meq | EXTENDED_RELEASE_TABLET | Freq: Once | ORAL | Status: AC
  Administered 2015-06-10: 20 meq via ORAL
  Filled 2015-06-10: qty 1

## 2015-06-10 MED ORDER — HEPARIN SODIUM (PORCINE) 1000 UNIT/ML IJ SOLN
INTRAMUSCULAR | Status: AC
  Filled 2015-06-10: qty 1

## 2015-06-10 MED ORDER — FENTANYL CITRATE (PF) 100 MCG/2ML IJ SOLN
INTRAMUSCULAR | Status: DC | PRN
  Administered 2015-06-10: 50 ug via INTRAVENOUS

## 2015-06-10 MED ORDER — SODIUM CHLORIDE 0.9 % IV SOLN: 250.0000 mL | INTRAVENOUS | Status: DC | PRN

## 2015-06-10 MED ORDER — MIDAZOLAM HCL 2 MG/2ML IJ SOLN
INTRAMUSCULAR | Status: DC | PRN
  Administered 2015-06-10: 1 mg via INTRAVENOUS

## 2015-06-10 MED ORDER — VERAPAMIL HCL 2.5 MG/ML IV SOLN
INTRAVENOUS | Status: DC | PRN
  Administered 2015-06-10: 2.5 mg via INTRA_ARTERIAL

## 2015-06-10 MED ORDER — SODIUM CHLORIDE 0.9 % IJ SOLN: 3.0000 mL | Freq: Two times a day (BID) | INTRAMUSCULAR | Status: DC

## 2015-06-10 SURGICAL SUPPLY — 6 items
CATH OPTITORQUE JACKY 4.0 5F (CATHETERS) ×2 IMPLANT
DEVICE RAD TR BAND REGULAR (VASCULAR PRODUCTS) ×1 IMPLANT
GLIDESHEATH SLEND SS 6F .021 (SHEATH) ×2 IMPLANT
KIT MANI 3VAL PERCEP (MISCELLANEOUS) ×2 IMPLANT
PACK CARDIAC CATH (CUSTOM PROCEDURE TRAY) ×2 IMPLANT
WIRE SAFE-T 1.5MM-J .035X260CM (WIRE) ×2 IMPLANT

## 2015-06-10 NOTE — Interval H&P Note (Signed)
History and Physical Interval Note:  06/10/2015 1:54 PM  [name].  has presented today for surgery, with the diagnosis of angina  The various methods of treatment have been discussed with the patient and family. After consideration of risks, benefits and other options for treatment, the patient has consented to  Procedure(s): Left Heart Cath (N/A) as a surgical intervention .  The patient's history has been reviewed, patient examined, no change in status, stable for surgery.  I have reviewed the patient's chart and labs.  Questions were answered to the patient's satisfaction.     Lorine Bears

## 2015-06-10 NOTE — Progress Notes (Signed)
Patient not complaining of itching at this time. No interventions needed

## 2015-06-10 NOTE — Progress Notes (Signed)
SATURATION QUALIFICATIONS: (This note is used to comply with regulatory documentation for home oxygen)  Patient Saturations on Room Air at Rest = 87%  Patient Saturations on Room Air while Ambulating = na%  Patient Saturations on 2 Liters of oxygen while Ambulating = 93%  Please briefly explain why patient needs home oxygen: patient desats at rest

## 2015-06-10 NOTE — H&P (View-Only) (Signed)
 Patient Name: Glen H Dutson Jr. Date of Encounter: 06/10/2015   Principal Problem:   Acute on chronic systolic (congestive) heart failure (HCC) Active Problems:   CAD (coronary artery disease)   Unstable angina (HCC)   Essential hypertension    SUBJECTIVE  Breathing improved.  Able to lie relatively flat with one pillow under his head.  No further chest pain.  He is adamant that he does not want any procedures here and wishes to be transferred to either [name] or [facility].  CURRENT MEDS . aspirin  81 mg Oral Daily  . atorvastatin  80 mg Oral q1800  . clopidogrel  75 mg Oral Daily  . enoxaparin (LOVENOX) injection  40 mg Subcutaneous Q24H  . furosemide  40 mg Intravenous Q12H  . isosorbide dinitrate  30 mg Oral QID  . losartan  25 mg Oral Daily    OBJECTIVE  Filed Vitals:   06/09/15 1131 06/09/15 2108 06/10/15 0540 06/10/15 0837  BP:  118/75 145/78 115/64  Pulse: 47 66 63 79  Temp:  97.7 F (36.5 C) 98 F (36.7 C)   TempSrc:  Oral    Resp:  18 22   Height:      Weight:      SpO2: 95% 95% 96% 88%    Intake/Output Summary (Last 24 hours) at 06/10/15 0900 Last data filed at 06/10/15 0700  Gross per 24 hour  Intake      0 ml  Output   1420 ml  Net  -1420 ml   Filed Weights   06/07/15 0736 06/07/15 1049  Weight: 220 lb 0.3 oz (99.8 kg) 213 lb 12.8 oz (96.979 kg)    PHYSICAL EXAM  General: NAD. Neuro: Alert and oriented X 3. Moves all extremities spontaneously. Psych: Flat affect, somewhat agitated. HEENT:  Normal  Neck: Supple without bruits or JVD. Lungs:  Resp regular and unlabored, diminished breath sounds bilaterally. Heart: RRR no s3, s4, or murmurs. Abdomen: Soft, non-tender, non-distended, BS + x 4.  Extremities: No clubbing, cyanosis or edema. DP/PT/Radials 2+ and equal bilaterally.  Accessory Clinical Findings  CBC  Recent Labs  06/07/15 2334  WBC 9.5  HGB 13.7  HCT 39.2*  MCV 89.2  PLT 246   Basic Metabolic Panel  Recent Labs   06/07/15 2334 06/08/15 0942  NA 138 138  K 3.5 3.7  CL 106 105  CO2 26 24  GLUCOSE 179* 170*  BUN 23* 21*  CREATININE 1.21 1.06  CALCIUM 8.7* 8.7*   Cardiac Enzymes  Recent Labs  06/07/15 1132 06/07/15 1709 06/07/15 2334  TROPONINI <0.03 <0.03 <0.03   Fasting Lipid Panel  Recent Labs  06/07/15 2334  CHOL 119  HDL 25*  LDLCALC 60  TRIG 171*  CHOLHDL 4.8   Thyroid Function Tests  Recent Labs  06/07/15 1132  TSH 1.051    TELE  Sinus brady/sinus rhythm.  Artifact giving the appearance of AV pacing.  Radiology/Studies  Dg Chest Portable 1 View  06/07/2015  CLINICAL DATA:  Chest pain and shortness of breath for 1 week. History of previous cardiac stenting. EXAM: PORTABLE CHEST 1 VIEW COMPARISON:  Chest x-ray dated 05/31/2010. FINDINGS: Moderate cardiomegaly is unchanged. There is central pulmonary vascular congestion and bilateral interstitial edema. Slightly more confluent airspace opacity at the right lung base suggests early alveolar pulmonary edema. No pleural effusion seen. No pneumothorax. Osseous and soft tissue structures about the chest are unremarkable. IMPRESSION: Cardiomegaly with central pulmonary vascular congestion and bilateral interstitial edema,   perhaps early alveolar pulmonary edema within the right lung, consistent with congestive heart failure. Electronically Signed   By: Bary Richard [name]   On: 06/07/2015 08:05   2D Echocardiogram 12.3.2016  Study Conclusions  - Left ventricle: The cavity size was normal. There was mild focal   basal hypertrophy of the septum. Systolic function was severely   reduced. The estimated ejection fraction was in the range of 20%   to 25%. Severe diffuse hypokinesis with akinesis of the inferior   and inferolateral walls. The apex was poorly-visualized. Doppler   parameters are consistent with a reversible restrictive pattern,   indicative of decreased left ventricular diastolic compliance   and/or increased left  atrial pressure (grade 3 diastolic   dysfunction). Doppler parameters are consistent with high   ventricular filling pressure. - Aortic valve: Valve area (Vmax): 3.07 cm^2. - Mitral valve: Calcified annulus. There was moderate   regurgitation. - Left atrium: The atrium was severely dilated. - Right atrium: The atrium was moderately dilated. - Tricuspid valve: There was mild regurgitation. - Inferior vena cava: The vessel was dilated. The respirophasic   diameter changes were blunted (< 50%), consistent with elevated   central venous pressure. _____________   ASSESSMENT AND PLAN  1.  Acute on chronic systolic chf:  Prev EF of 45%, now 20-25% with diff HK and AK of the inf/inflat walls.  With diuresis, he has had significant clinical improvement and is minus 2.9L this admission.  No weight in a few days.  Orthopnea improved.  Switch lasix to 40 PO bid. Cont ARB.  Coreg held 2/2 bradycardia and LH.    2.  Unstable Angina:  He had an episode of c/p prior to admission. No further c/p.  Troponins negative.  With drop in EF, recommend diagnostic cath.  He is now clinically stable for cath and can lie flat.  Renal fxn stable.  He is adamant this AM that he does not want any invasive procedure performed @ [facility] and wishes to be transferred to either [facility] or [facility].  Cont asa, plavix, nitrate, and statin.  BB on hold 2/2 bradycardia.  3.  Essential HTN:  Stable on ARB.  4.  Symptomatic bradycardia:  No further significant or symptomatic bradycardia.  BB remains on hold.  Rates mostly in 60's.  Could try to resume @ low dose given ICM.  Signed, Nicolasa Ducking NP   Attending Note Patient seen and examined, agree with detailed note above,  Patient presentation and plan discussed on rounds.   Patient seen this AM, reports breathing is better  Overall feels worse (bad food, sleep, etc) Denies any active chest pain symptoms Cardiac enzymes negative, no indication of active ischemia Ejection  fraction noted to have decreased from 45% down to 25% He has written on the white board "no procedures to be performed at this hospital" Indicated to our team that he did not like the doctors at this facility Requesting transfer to [facility] or [name] He does not have a relationship with any doctors at these facilities, has not had recent clinic follow-up  Long discussion concerning various options available to him including medical management and discharge today Also offered pharmacologic stress testing to rule out ischemia. Cardiac catheterization was also offered yesterday to him He has declined any procedures  then Told me to "get out, you don't know anything" "I know more than you do".  Case discussed with hospitalist and case managerAs well as nursing. Given his unwillingness to have any  procedures here, would consider discharge with close follow-up with physician at [facility]. Perhaps we can help facilitate appointment at [facility] as outpatient, they can finish workup --discharge on his current medication regimen   Signed: Dossie Arbour  [name], Ph.D. [facility]

## 2015-06-10 NOTE — Progress Notes (Signed)
Notified Dr Kirke Corin that patient has had icecream and beverages until approximately 10:30 this am and had eated meals overnight per floor nurse report. Concern acknowledged, no new orders.

## 2015-06-10 NOTE — Progress Notes (Signed)
Patient Name: [name]. Date of Encounter: 06/10/2015   Principal Problem:   Acute on chronic systolic (congestive) heart failure (HCC) Active Problems:   CAD (coronary artery disease)   Unstable angina (HCC)   Essential hypertension    SUBJECTIVE  Breathing improved.  Able to lie relatively flat with one pillow under his head.  No further chest pain.  He is adamant that he does not want any procedures here and wishes to be transferred to either [facility] or [facility].  CURRENT MEDS . aspirin  81 mg Oral Daily  . atorvastatin  80 mg Oral q1800  . clopidogrel  75 mg Oral Daily  . enoxaparin (LOVENOX) injection  40 mg Subcutaneous Q24H  . furosemide  40 mg Intravenous Q12H  . isosorbide dinitrate  30 mg Oral QID  . losartan  25 mg Oral Daily    OBJECTIVE  Filed Vitals:   06/09/15 1131 06/09/15 2108 06/10/15 0540 06/10/15 0837  BP:  118/75 145/78 115/64  Pulse: 47 66 63 79  Temp:  97.7 F (36.5 C) 98 F (36.7 C)   TempSrc:  Oral    Resp:  18 22   Height:      Weight:      SpO2: 95% 95% 96% 88%    Intake/Output Summary (Last 24 hours) at 06/10/15 0900 Last data filed at 06/10/15 0700  Gross per 24 hour  Intake      0 ml  Output   1420 ml  Net  -1420 ml   Filed Weights   06/07/15 0736 06/07/15 1049  Weight: 220 lb 0.3 oz (99.8 kg) 213 lb 12.8 oz (96.979 kg)    PHYSICAL EXAM  General: NAD. Neuro: Alert and oriented X 3. Moves all extremities spontaneously. Psych: Flat affect, somewhat agitated. HEENT:  Normal  Neck: Supple without bruits or JVD. Lungs:  Resp regular and unlabored, diminished breath sounds bilaterally. Heart: RRR no s3, s4, or murmurs. Abdomen: Soft, non-tender, non-distended, BS + x 4.  Extremities: No clubbing, cyanosis or edema. DP/PT/Radials 2+ and equal bilaterally.  Accessory Clinical Findings  CBC  Recent Labs  06/07/15 2334  WBC 9.5  HGB 13.7  HCT 39.2*  MCV 89.2  PLT 246   Basic Metabolic Panel  Recent Labs   16/10/96 2334 06/08/15 0942  NA 138 138  K 3.5 3.7  CL 106 105  CO2 26 24  GLUCOSE 179* 170*  BUN 23* 21*  CREATININE 1.21 1.06  CALCIUM 8.7* 8.7*   Cardiac Enzymes  Recent Labs  06/07/15 1132 06/07/15 1709 06/07/15 2334  TROPONINI <0.03 <0.03 <0.03   Fasting Lipid Panel  Recent Labs  06/07/15 2334  CHOL 119  HDL 25*  LDLCALC 60  TRIG 045*  CHOLHDL 4.8   Thyroid Function Tests  Recent Labs  06/07/15 1132  TSH 1.051    TELE  Sinus brady/sinus rhythm.  Artifact giving the appearance of AV pacing.  Radiology/Studies  Dg Chest Portable 1 View  06/07/2015  CLINICAL DATA:  Chest pain and shortness of breath for 1 week. History of previous cardiac stenting. EXAM: PORTABLE CHEST 1 VIEW COMPARISON:  Chest x-ray dated 05/31/2010. FINDINGS: Moderate cardiomegaly is unchanged. There is central pulmonary vascular congestion and bilateral interstitial edema. Slightly more confluent airspace opacity at the right lung base suggests early alveolar pulmonary edema. No pleural effusion seen. No pneumothorax. Osseous and soft tissue structures about the chest are unremarkable. IMPRESSION: Cardiomegaly with central pulmonary vascular congestion and bilateral interstitial edema,  perhaps early alveolar pulmonary edema within the right lung, consistent with congestive heart failure. Electronically Signed   By: Bary Richard [name]   On: 06/07/2015 08:05   2D Echocardiogram 12.3.2016  Study Conclusions  - Left ventricle: The cavity size was normal. There was mild focal   basal hypertrophy of the septum. Systolic function was severely   reduced. The estimated ejection fraction was in the range of 20%   to 25%. Severe diffuse hypokinesis with akinesis of the inferior   and inferolateral walls. The apex was poorly-visualized. Doppler   parameters are consistent with a reversible restrictive pattern,   indicative of decreased left ventricular diastolic compliance   and/or increased left  atrial pressure (grade 3 diastolic   dysfunction). Doppler parameters are consistent with high   ventricular filling pressure. - Aortic valve: Valve area (Vmax): 3.07 cm^2. - Mitral valve: Calcified annulus. There was moderate   regurgitation. - Left atrium: The atrium was severely dilated. - Right atrium: The atrium was moderately dilated. - Tricuspid valve: There was mild regurgitation. - Inferior vena cava: The vessel was dilated. The respirophasic   diameter changes were blunted (< 50%), consistent with elevated   central venous pressure. _____________   ASSESSMENT AND PLAN  1.  Acute on chronic systolic chf:  Prev EF of 45%, now 20-25% with diff HK and AK of the inf/inflat walls.  With diuresis, he has had significant clinical improvement and is minus 2.9L this admission.  No weight in a few days.  Orthopnea improved.  Switch lasix to 40 PO bid. Cont ARB.  Coreg held 2/2 bradycardia and LH.    2.  Unstable Angina:  He had an episode of c/p prior to admission. No further c/p.  Troponins negative.  With drop in EF, recommend diagnostic cath.  He is now clinically stable for cath and can lie flat.  Renal fxn stable.  He is adamant this AM that he does not want any invasive procedure performed @ [facility] and wishes to be transferred to either [facility] or [facility].  Cont asa, plavix, nitrate, and statin.  BB on hold 2/2 bradycardia.  3.  Essential HTN:  Stable on ARB.  4.  Symptomatic bradycardia:  No further significant or symptomatic bradycardia.  BB remains on hold.  Rates mostly in 60's.  Could try to resume @ low dose given ICM.  Signed, Nicolasa Ducking NP   Attending Note Patient seen and examined, agree with detailed note above,  Patient presentation and plan discussed on rounds.   Patient seen this AM, reports breathing is better  Overall feels worse (bad food, sleep, etc) Denies any active chest pain symptoms Cardiac enzymes negative, no indication of active ischemia Ejection  fraction noted to have decreased from 45% down to 25% He has written on the white board "no procedures to be performed at this hospital" Indicated to our team that he did not like the doctors at this facility Requesting transfer to [facility] or [name] He does not have a relationship with any doctors at these facilities, has not had recent clinic follow-up  Long discussion concerning various options available to him including medical management and discharge today Also offered pharmacologic stress testing to rule out ischemia. Cardiac catheterization was also offered yesterday to him He has declined any procedures  then Told me to "get out, you don't know anything" "I know more than you do".  Case discussed with hospitalist and case managerAs well as nursing. Given his unwillingness to have any  procedures here, would consider discharge with close follow-up with physician at [facility]. Perhaps we can help facilitate appointment at [facility] as outpatient, they can finish workup --discharge on his current medication regimen   Signed: Dossie Arbour  [name], Ph.D. [facility]

## 2015-06-10 NOTE — Progress Notes (Signed)
[facility] [place] [facility]                                                                                                                                                                                            Patient Demographics   [name], is a 59 y.o. male, DOB - 1955-08-19, ZOX:[id number]  Admit date - 06/07/2015   Admitting Physician Auburn Bilberry, MD  Outpatient Primary MD for the patient is No primary care provider on file.   LOS - 3  Subjective: Breathing is improved was very upset about being here earlier today but now fine Review of Systems:   CONSTITUTIONAL: No documented fever. No fatigue, weakness. No weight gain, no weight loss.  EYES: Positive blurry or no double vision.  ENT: No tinnitus. No postnasal drip. No redness of the oropharynx.  RESPIRATORY: No cough, no wheeze, no hemoptysis. Positive dyspnea.  CARDIOVASCULAR: No chest pain. No orthopnea. No palpitations. No syncope.  GASTROINTESTINAL: No nausea, no vomiting or diarrhea. No abdominal pain. No melena or hematochezia.  GENITOURINARY: No dysuria or hematuria.  ENDOCRINE: No polyuria or nocturia. No heat or cold intolerance.  HEMATOLOGY: No anemia. No bruising. No bleeding.  INTEGUMENTARY: No rashes. No lesions.  MUSCULOSKELETAL: No arthritis. No swelling. No gout.  NEUROLOGIC: No numbness, tingling, or ataxia. No seizure-type activity.  PSYCHIATRIC: No anxiety. No insomnia. No ADD.    Vitals:   Filed Vitals:   06/10/15 1436 06/10/15 1449 06/10/15 1500 06/10/15 1530  BP: 144/86 141/86 129/71 146/87  Pulse: 63 70 61 58  Temp:      TempSrc:      Resp: Height:      Weight:      SpO2: 91% 94% 94% 89%    Wt Readings from Last 3 Encounters:  06/07/15 96.979 kg (213 lb 12.8 oz)     Intake/Output Summary (Last 24 hours) at 06/10/15 1555 Last data filed at 06/10/15 1100  Gross per 24 hour  Intake      0 ml  Output   1320 ml  Net  -1320 ml     Physical Exam:   GENERAL: Pleasant-appearing in no apparent distress.  HEAD, EYES, EARS, NOSE AND THROAT: Atraumatic, normocephalic. Extraocular muscles are intact. Pupils equal and reactive to light. Sclerae anicteric. No conjunctival injection. No oro-pharyngeal erythema.  NECK: Supple. There is no jugular venous distention. No bruits, no lymphadenopathy, no thyromegaly.  HEART: Regular rate and rhythm,. No murmurs, no rubs, no clicks.  LUNGS: Bilateral crackles at the bases  ABDOMEN: Soft, flat, nontender, nondistended. Has good bowel sounds.  No hepatosplenomegaly appreciated.  EXTREMITIES: No evidence of any cyanosis, clubbing, or peripheral edema.  +2 pedal and radial pulses bilaterally.  NEUROLOGIC: The patient is alert, awake, and oriented x3 with no focal motor or sensory deficits appreciated bilaterally.  SKIN: Moist and warm with no rashes appreciated.  Psych: Not anxious, depressed LN: No inguinal LN enlargement    Antibiotics   Anti-infectives    None      Medications   Scheduled Meds: . [MAR Hold] aspirin  81 mg Oral Daily  . [MAR Hold] atorvastatin  80 mg Oral q1800  . [MAR Hold] clopidogrel  75 mg Oral Daily  . [MAR Hold] enoxaparin (LOVENOX) injection  40 mg Subcutaneous Q24H  . [MAR Hold] furosemide  40 mg Oral BID  . [MAR Hold] isosorbide dinitrate  30 mg Oral QID  . [MAR Hold] losartan  25 mg Oral Daily  . oxyCODONE-acetaminophen      . potassium chloride  20 mEq Oral Once  . sodium chloride  3 mL Intravenous Q12H  . sodium chloride  3 mL Intravenous Q12H   Continuous Infusions: . [START ON 06/11/2015] sodium chloride    . sodium chloride     PRN Meds:.sodium chloride, sodium chloride, [MAR Hold] acetaminophen **OR** [MAR Hold] acetaminophen, [MAR Hold] ipratropium-albuterol, [MAR Hold] nitroGLYCERIN, [MAR Hold] ondansetron **OR** [MAR Hold] ondansetron (ZOFRAN) IV, [MAR Hold] oxyCODONE-acetaminophen, [MAR Hold] sodium chloride, sodium chloride, sodium  chloride   Data Review:   Micro Results No results found for this or any previous visit (from the past 240 hour(s)).  Radiology Reports Dg Chest Portable 1 View  06/07/2015  CLINICAL DATA:  Chest pain and shortness of breath for 1 week. History of previous cardiac stenting. EXAM: PORTABLE CHEST 1 VIEW COMPARISON:  Chest x-ray dated 05/31/2010. FINDINGS: Moderate cardiomegaly is unchanged. There is central pulmonary vascular congestion and bilateral interstitial edema. Slightly more confluent airspace opacity at the right lung base suggests early alveolar pulmonary edema. No pleural effusion seen. No pneumothorax. Osseous and soft tissue structures about the chest are unremarkable. IMPRESSION: Cardiomegaly with central pulmonary vascular congestion and bilateral interstitial edema, perhaps early alveolar pulmonary edema within the right lung, consistent with congestive heart failure. Electronically Signed   By: Bary Richard [name]   On: 06/07/2015 08:05     CBC  Recent Labs Lab 06/07/15 0749 06/07/15 2334  WBC 10.8* 9.5  HGB 13.5 13.7  HCT 39.2* 39.2*  PLT 222 246  MCV 89.6 89.2  MCH 30.8 31.0  MCHC 34.3 34.8  RDW 15.7* 15.7*  LYMPHSABS 1.4  --   MONOABS 0.8  --   EOSABS 0.1  --   BASOSABS 0.1  --     Chemistries   Recent Labs Lab 06/07/15 0749 06/07/15 2334 06/08/15 0942 06/10/15 0840  NA 138 138 138 135  K 4.0 3.5 3.7 3.4*  CL 110 106 105 101  CO2 21* GLUCOSE 145* 179* 170* 187*  BUN 19 23* 21* 19  CREATININE 1.04 1.21 1.06 0.92  CALCIUM 8.7* 8.7* 8.7* 8.4*  AST 24  --   --   --   ALT 15*  --   --   --   ALKPHOS 83  --   --   --   BILITOT 2.1*  --   --   --    ------------------------------------------------------------------------------------------------------------------ estimated creatinine clearance is 95.4 mL/min (by C-G formula based on Cr of  0.92). ------------------------------------------------------------------------------------------------------------------ No results for input(s): HGBA1C in the last  72 hours. ------------------------------------------------------------------------------------------------------------------  Recent Labs  06/07/15 2334  CHOL 119  HDL 25*  LDLCALC 60  TRIG 789*  CHOLHDL 4.8   ------------------------------------------------------------------------------------------------------------------ No results for input(s): TSH, T4TOTAL, T3FREE, THYROIDAB in the last 72 hours.  Invalid input(s): FREET3 ------------------------------------------------------------------------------------------------------------------ No results for input(s): VITAMINB12, FOLATE, FERRITIN, TIBC, IRON, RETICCTPCT in the last 72 hours.  Coagulation profile No results for input(s): INR, PROTIME in the last 168 hours.  No results for input(s): DDIMER in the last 72 hours.  Cardiac Enzymes  Recent Labs Lab 06/07/15 1132 06/07/15 1709 06/07/15 2334  TROPONINI <0.03 <0.03 <0.03   ------------------------------------------------------------------------------------------------------------------ Invalid input(s): POCBNP    Assessment & Plan   IMPRESSION AND PLAN: Patient is a 59 year old white male with history of coronary artery disease presents with shortness of breath  1. Acute systolic CHF: Continue IV Lasix continues to improve  2. Chest pressure: I have discussed with Dr. Lewie Loron they'll be planning to do a cardiac Later today   3. Hypertension blood pressure continue losartan discontinue Coreg due to bradycardia  4. Blurred vision and weakness and dizziness possibly related to low heart rate. Coreg  5. Miscellaneous Lovenox for DVT prophylaxis   All the records are reviewed and case discussed with ED provider.     Code Status Orders        Start     Ordered   06/07/15 1122  Full code    Continuous     06/07/15 1121           Consults  cardiology DVT Prophylaxis  Lovenox   Lab Results  Component Value Date   PLT 246 06/07/2015     Time Spent in minutes 32 minutes Auburn Bilberry M.D on 06/10/2015 at 3:55 PM  Between 7am to 6pm - Pager - [phone]  After 6pm go to [email or web address] - password [email or web address] [facility]  [facility] [place] [facility]   Office  [phone]

## 2015-06-10 NOTE — Progress Notes (Signed)
Report given by Alana patient down in Cath lab

## 2015-06-10 NOTE — Telephone Encounter (Signed)
S/w pt nurse, Alana, to inform her that Dr. Kirke Corin available for cath today at 1:30pm.

## 2015-06-10 NOTE — Progress Notes (Addendum)
Patient complains of itching post cardiac catheterization. Reviewed allergies with patient and he stated that he is allergic to contrast which was not on his allergy list. Patient does not remember his reactions to contrast. Paged prime doctor waiting for them to call back. Spoke with Dr. Cherlynn Kaiser he stated that will order something for itching.

## 2015-06-11 ENCOUNTER — Telehealth: Payer: Self-pay

## 2015-06-11 ENCOUNTER — Encounter: Payer: Self-pay | Admitting: Nurse Practitioner

## 2015-06-11 DIAGNOSIS — I255 Ischemic cardiomyopathy: Secondary | ICD-10-CM | POA: Diagnosis present

## 2015-06-11 DIAGNOSIS — I5022 Chronic systolic (congestive) heart failure: Secondary | ICD-10-CM | POA: Diagnosis present

## 2015-06-11 DIAGNOSIS — I5042 Chronic combined systolic (congestive) and diastolic (congestive) heart failure: Secondary | ICD-10-CM | POA: Diagnosis present

## 2015-06-11 MED ORDER — CARVEDILOL 6.25 MG PO TABS: 6.2500 mg | ORAL_TABLET | Freq: Two times a day (BID) | ORAL | Status: DC

## 2015-06-11 MED ORDER — ATORVASTATIN CALCIUM 80 MG PO TABS: 80.0000 mg | ORAL_TABLET | Freq: Every day | ORAL | Status: DC

## 2015-06-11 MED ORDER — ASPIRIN 81 MG PO CHEW: 81.0000 mg | CHEWABLE_TABLET | Freq: Every day | ORAL | Status: DC

## 2015-06-11 MED ORDER — FUROSEMIDE 10 MG/ML IJ SOLN
40.0000 mg | Freq: Once | INTRAMUSCULAR | Status: AC
  Administered 2015-06-11: 40 mg via INTRAVENOUS
  Filled 2015-06-11: qty 4

## 2015-06-11 MED ORDER — POTASSIUM CHLORIDE CRYS ER 20 MEQ PO TBCR
40.0000 meq | EXTENDED_RELEASE_TABLET | Freq: Once | ORAL | Status: AC
  Administered 2015-06-11: 40 meq via ORAL
  Filled 2015-06-11: qty 2

## 2015-06-11 MED ORDER — LOSARTAN POTASSIUM 25 MG PO TABS: 25.0000 mg | ORAL_TABLET | Freq: Every day | ORAL | Status: DC

## 2015-06-11 MED ORDER — FUROSEMIDE 40 MG PO TABS: 40.0000 mg | ORAL_TABLET | Freq: Two times a day (BID) | ORAL | Status: DC

## 2015-06-11 MED ORDER — POTASSIUM CHLORIDE CRYS ER 20 MEQ PO TBCR: 20.0000 meq | EXTENDED_RELEASE_TABLET | Freq: Every day | ORAL | Status: DC

## 2015-06-11 MED ORDER — CARVEDILOL 6.25 MG PO TABS: 25.0000 mg | ORAL_TABLET | Freq: Two times a day (BID) | ORAL | Status: DC

## 2015-06-11 NOTE — Telephone Encounter (Signed)
-----   Message from Elsie Saas sent at 06/11/2015  9:42 AM EST ----- Regarding: tcm Thayer Ohm called and made apt for patient he is coming 06/18/15 pt is TCM

## 2015-06-11 NOTE — Progress Notes (Signed)
Patient Name: [name]. Date of Encounter: 06/11/2015   Principal Problem:   Acute on chronic systolic (congestive) heart failure (HCC) Active Problems:   CAD (coronary artery disease)   Unstable angina (HCC)   Essential hypertension   Hyperlipidemia    SUBJECTIVE  No chest pain.  Mildly dyspneic with ambulation to the bathroom this AM.  EDP 15 on cath yesterday.  R radial w/o pain/bleeding.  CURRENT MEDS . aspirin  81 mg Oral Daily  . atorvastatin  80 mg Oral q1800  . clopidogrel  75 mg Oral Daily  . furosemide  40 mg Oral BID  . isosorbide dinitrate  30 mg Oral QID  . losartan  25 mg Oral Daily  . sodium chloride  3 mL Intravenous Q12H    OBJECTIVE  Filed Vitals:   06/10/15 1626 06/10/15 1941 06/11/15 0537 06/11/15 0836  BP: 113/76 123/71 110/59 154/85  Pulse: 68 77 66 60  Temp:  98.1 F (36.7 C) 98.1 F (36.7 C)   TempSrc:  Oral Oral   Resp: 18 18 18    Height:      Weight:      SpO2: 89% 96% 91%     Intake/Output Summary (Last 24 hours) at 06/11/15 0920 Last data filed at 06/11/15 0842  Gross per 24 hour  Intake      3 ml  Output    200 ml  Net   -197 ml   Filed Weights   06/07/15 0736 06/07/15 1049  Weight: 220 lb 0.3 oz (99.8 kg) 213 lb 12.8 oz (96.979 kg)    PHYSICAL EXAM  General: Pleasant, NAD. Neuro: Alert and oriented X 3. Moves all extremities spontaneously. Psych: Normal affect. HEENT:  Normal  Neck: Supple without bruits or JVD. Lungs:  Resp regular and unlabored, diminished breath sounds throughout - more so in bases. Heart: RRR no s3, s4, or murmurs. Abdomen: Soft, non-tender, non-distended, BS + x 4.  Extremities: No clubbing, cyanosis or edema. DP/PT/Radials 2+ and equal bilaterally. R wrist cath site w/o bleeding/bruit/hematoma.  Accessory Clinical Findings  Basic Metabolic Panel  Recent Labs  06/08/15 0942 06/10/15 0840  NA 138 135  K 3.7 3.4*  CL 105 101  CO2 24 26  GLUCOSE 170* 187*  BUN 21* 19  CREATININE  1.06 0.92  CALCIUM 8.7* 8.4*   TELE  SB/RSR, freq pvc's/couplets.  Radiology/Studies  Dg Chest Portable 1 View  06/07/2015  CLINICAL DATA:  Chest pain and shortness of breath for 1 week. History of previous cardiac stenting. EXAM: PORTABLE CHEST 1 VIEW COMPARISON:  Chest x-ray dated 05/31/2010. FINDINGS: Moderate cardiomegaly is unchanged. There is central pulmonary vascular congestion and bilateral interstitial edema. Slightly more confluent airspace opacity at the right lung base suggests early alveolar pulmonary edema. No pleural effusion seen. No pneumothorax. Osseous and soft tissue structures about the chest are unremarkable. IMPRESSION: Cardiomegaly with central pulmonary vascular congestion and bilateral interstitial edema, perhaps early alveolar pulmonary edema within the right lung, consistent with congestive heart failure. Electronically Signed   By: Bary Richard [name]   On: 06/07/2015 08:05    ASSESSMENT AND PLAN  1. Acute on chronic systolic chf: Prev EF of 45%, now 20-25% with diff HK and AK of the inf/inflat walls. With diuresis, he has had significant clinical improvement and is minus 3.2L this admission. No weight in a few days. Orthopnea improved though he says that he felt dyspneic after walking to bathroom this AM.  EDP 15 on  cath yesterday.  I will give one dose of IV lasix this AM.  Cont lasix to 40 PO bid at d/c and add kdur 20 daily. Cont ARB. Agree with resuming low-dose coreg (reordered this AM).  Will look to add spiro as an outpt.  I will arrange for f/u w/in 7 days.  He has CHF clinic f/u scheduled for 12/29 @ 9 AM.  2. Unstable Angina: He had an episode of c/p prior to admission. No further c/p. Troponins negative. With drop in EF, cath was performed yesterday revealing ISR in the RCA with CTO, patent LAD and Diag stents, and L R collaterals.  Medical therapy recommended.  Cont asa, plavix, nitrate, and statin. Resume low-dose bb as above.  3. Essential  HTN: This has been stable.  Higher this AM.  Cont ARB.  Resume coreg 6.25 bid (prev on 25 bid).  4. Symptomatic bradycardia: No further significant or symptomatic bradycardia. Resume coreg 6.25 bid.  5.  Hypokalemia:  Supp.  It appears he will need kcl 20 daily @ home w/ f/u bmet next week.  Signed, Nicolasa Ducking NP

## 2015-06-11 NOTE — Care Management (Signed)
Patient admitted with chf.  He has had many previous stents.  When asked if this is a new diagnosis he responds "What do you think.. I have had at least 6 stents.  He says his PCP is Dewaine Oats but he does not see him regularly.  Patient says he is compliant with his heart medications- does not see a cardiologist regularly and does not offer how he is able to keep his meds filled if he does not follow up with pcp on a regular basis.   Patient has been seen off the nursing unit in the cafeteria buying food.   He denies issues with transportation and declines any post discharge follow up such as the CHF clinic.  He says is not interested in any further discussion with CM

## 2015-06-11 NOTE — Progress Notes (Signed)
Discharge instructions given. Education on heart failure and vascular access care given. Prescriptions were sent to patient's pharmacy. Reviewed new medications with patient. No questions at this time. IV's and tele discontinued.

## 2015-06-11 NOTE — Telephone Encounter (Signed)
Attempted to contact pt for TCM call.  Pt's home # has been disconnected. Pt has not been discharged from [facility] yet.

## 2015-06-11 NOTE — Progress Notes (Signed)
Patient is still complaining of feeling dizzy and states "I feel worse than when I came in here." Asked the patient if he would like to stay another night and see if he feels better and patient stated "This hospital is a joke. I'll leave here and go somewhere else like [facility]." The patient states he will have someone able to pick him up and take him somewhere else. Apologized to the patient. Consulted Dr. Allena Katz to let him know patient would not walk around the unit to test his dizziness and oxygen. MD stated okay to discharge still.

## 2015-06-11 NOTE — Discharge Summary (Signed)
[name]., 59 y.o., DOB 10-29-1955, MRN [id number]. Admission date: 06/07/2015 Discharge Date 06/11/2015 Primary MD No primary care provider on file. Admitting Physician Auburn Bilberry, MD  Admission Diagnosis  Chest pain, unspecified chest pain type [R07.9] Acute on chronic congestive heart failure, unspecified congestive heart failure type (HCC) [I50.9]  Discharge Diagnosis   Principal Problem:   Acute on chronic systolic (congestive) heart failure (HCC)   Unstable angina (HCC)   CAD (coronary artery disease)   Essential hypertension   Hyperlipidemia   Chronic combined systolic and diastolic CHF (congestive heart failure) (HCC)   Ischemic cardiomyopathy Suspected non-medical noncompliance Dizziness Bradycardia        Hospital Course [name] is a 59 y.o. male with a known history of coronary artery disease with multiple stents in the past as well as hypertension presenting with shortness of breath and chest pressure. Patient was noted to have acute systolic CHF. He was started on IV Lasix. With improvement in his symptoms. He underwent echo which showed decrease in EF of 30%. Therefore he underwent a cardiac catheterization,  Cardiac catheterization showed following  Prox RCA to Mid RCA lesion, 100% stenosed. The lesion was previously treated with a stent (unknown type) greater than two years ago.  Dist RCA lesion, 70% stenosed.  Mid Cx lesion, 30% stenosed.  2nd Mrg lesion, 60% stenosed.  Mid LAD to Dist LAD lesion, 20% stenosed. The lesion was previously treated with a stent (unknown type).  1st Diag lesion, 30% stenosed. The lesion was previously treated with a stent (unknown type).  Cardiology recommended medical management due to good collaterals.      Consults  cardiology  Significant Tests:  See full reports for all details      Dg Chest Portable 1 View  06/07/2015  CLINICAL DATA:  Chest pain and shortness of breath for 1 week. History of  previous cardiac stenting. EXAM: PORTABLE CHEST 1 VIEW COMPARISON:  Chest x-ray dated 05/31/2010. FINDINGS: Moderate cardiomegaly is unchanged. There is central pulmonary vascular congestion and bilateral interstitial edema. Slightly more confluent airspace opacity at the right lung base suggests early alveolar pulmonary edema. No pleural effusion seen. No pneumothorax. Osseous and soft tissue structures about the chest are unremarkable. IMPRESSION: Cardiomegaly with central pulmonary vascular congestion and bilateral interstitial edema, perhaps early alveolar pulmonary edema within the right lung, consistent with congestive heart failure. Electronically Signed   By: Bary Richard [name]   On: 06/07/2015 08:05       Today   Subjective:   [name]  still complains of some shortness of breath but no chest pain  Objective:   Blood pressure 144/91, pulse 58, temperature 98 F (36.7 C), temperature source Oral, resp. rate 20, height 5\' 6"  (1.676 m), weight 96.979 kg (213 lb 12.8 oz), SpO2 99 %.  .  Intake/Output Summary (Last 24 hours) at 06/11/15 1531 Last data filed at 06/11/15 1300  Gross per 24 hour  Intake      3 ml  Output   1000 ml  Net   -997 ml    Exam VITAL SIGNS: Blood pressure 144/91, pulse 58, temperature 98 F (36.7 C), temperature source Oral, resp. rate 20, height 5\' 6"  (1.676 m), weight 96.979 kg (213 lb 12.8 oz), SpO2 99 %.  GENERAL:  59 y.o.-year-old patient lying in the bed with no acute distress.  EYES: Pupils equal, round, reactive to light and accommodation. No scleral icterus. Extraocular muscles intact.  HEENT: Head atraumatic, normocephalic. Oropharynx and  nasopharynx clear.  NECK:  Supple, no jugular venous distention. No thyroid enlargement, no tenderness.  LUNGS: Normal breath sounds bilaterally, no wheezing, rales,rhonchi or crepitation. No use of accessory muscles of respiration.  CARDIOVASCULAR: S1, S2 normal. No murmurs, rubs, or gallops.  ABDOMEN: Soft,  nontender, nondistended. Bowel sounds present. No organomegaly or mass.  EXTREMITIES: No pedal edema, cyanosis, or clubbing.  NEUROLOGIC: Cranial nerves II through XII are intact. Muscle strength 5/5 in all extremities. Sensation intact. Gait not checked.  PSYCHIATRIC: The patient is alert and oriented x 3.  SKIN: No obvious rash, lesion, or ulcer.   Data Review     CBC w Diff: Lab Results  Component Value Date   WBC 9.5 06/07/2015   HGB 13.7 06/07/2015   HCT 39.2* 06/07/2015   PLT 246 06/07/2015   LYMPHOPCT 13 06/07/2015   MONOPCT 7 06/07/2015   EOSPCT 1 06/07/2015   BASOPCT 1 06/07/2015   CMP: Lab Results  Component Value Date   NA 135 06/10/2015   K 3.4* 06/10/2015   CL 101 06/10/2015   CO2 26 06/10/2015   BUN 19 06/10/2015   CREATININE 0.92 06/10/2015   PROT 7.6 06/07/2015   ALBUMIN 3.7 06/07/2015   BILITOT 2.1* 06/07/2015   ALKPHOS 83 06/07/2015   AST 24 06/07/2015   ALT 15* 06/07/2015  .  Micro Results No results found for this or any previous visit (from the past 240 hour(s)).      Code Status Orders        Start     Ordered   06/10/15 1425  Full code   Continuous     06/10/15 1425          Follow-up Information    Follow up with Delma Freeze, FNP. Go on 07/03/2015.   Specialty:  Family Medicine   Why:  at 9:00am , to the [facility]   Contact information:   [address] [place] [address] [phone]       Follow up with pcp In 7 days.      Follow up with Nicolasa Ducking, NP. Go on 06/18/2015.   Specialties:  Nurse Practitioner, Cardiology, Radiology   Why:  11:30 AM - Cardiology - Dr. Jari Sportsman Nurse Practitioner   Contact information:   [address] [place] [address] [phone]       Discharge Medications     Medication List    TAKE these medications        aspirin 81 MG chewable tablet  Chew 1 tablet (81 mg total) by mouth daily.     atorvastatin 80 MG tablet   Commonly known as:  LIPITOR  Take 1 tablet (80 mg total) by mouth daily at 6 PM.     carvedilol 6.25 MG tablet  Commonly known as:  COREG  Take 1 tablet (6.25 mg total) by mouth 2 (two) times daily with a meal.     clopidogrel 75 MG tablet  Commonly known as:  PLAVIX  Take 75 mg by mouth daily.     furosemide 40 MG tablet  Commonly known as:  LASIX  Take 1 tablet (40 mg total) by mouth 2 (two) times daily.     isosorbide dinitrate 30 MG tablet  Commonly known as:  ISORDIL  Take 30 mg by mouth 4 (four) times daily.     losartan 25 MG tablet  Commonly known as:  COZAAR  Take 1 tablet (25 mg total)  by mouth daily.     potassium chloride SA 20 MEQ tablet  Commonly known as:  K-DUR,KLOR-CON  Take 1 tablet (20 mEq total) by mouth daily.           Total Time in preparing paper work, data evaluation and todays exam - 35 minutes  Auburn Bilberry M.D on 06/11/2015 at 3:31 PM  [facility]   Office  [phone]

## 2015-06-11 NOTE — Progress Notes (Signed)
Patient complaining of shortness of breath and dizziness. Put Oxygen on patient for comfort. Sat was 96% on room air. Vitals are stable. Given one time dose of IV lasix. Will continue to assess. Will attempt to walk patient once he feels stable.

## 2015-06-11 NOTE — Discharge Instructions (Signed)
**PLEASE REMEMBER TO BRING ALL OF YOUR MEDICATIONS TO EACH OF YOUR FOLLOW-UP OFFICE VISITS.  Radial Site Care Refer to this sheet in the next few weeks. These instructions provide you with information on caring for yourself after your procedure. Your caregiver may also give you more specific instructions. Your treatment has been planned according to current medical practices, but problems sometimes occur. Call your caregiver if you have any problems or questions after your procedure. HOME CARE INSTRUCTIONS  You may shower the day after the procedure.Remove the bandage (dressing) and gently wash the site with plain soap and water.Gently pat the site dry.   Do not apply powder or lotion to the site.   Do not submerge the affected site in water for 3 to 5 days.   Inspect the site at least twice daily.   Do not flex or bend the affected arm for 24 hours.   No lifting over 5 pounds (2.3 kg) for 5 days after your procedure.   Do not drive home if you are discharged the same day of the procedure. Have someone else drive you.   You may drive 24 hours after the procedure unless otherwise instructed by your caregiver.  What to expect:  Any bruising will usually fade within 1 to 2 weeks.   Blood that collects in the tissue (hematoma) may be painful to the touch. It should usually decrease in size and tenderness within 1 to 2 weeks.  SEEK IMMEDIATE MEDICAL CARE IF:  You have unusual pain at the radial site.   You have redness, warmth, swelling, or pain at the radial site.   You have drainage (other than a small amount of blood on the dressing).   You have chills.   You have a fever or persistent symptoms for more than 72 hours.   You have a fever and your symptoms suddenly get worse.   Your arm becomes pale, cool, tingly, or numb.   You have heavy bleeding from the site. Hold pressure on the site.  _____________    [facility] appointment on July 03, 2015 at 9:00am  with Clarisa Kindred, FNP. Please call [phone] to reschedule.   Heart Failure Heart failure is a condition in which the heart has trouble pumping blood. This means your heart does not pump blood efficiently for your body to work well. In some cases of heart failure, fluid may back up into your lungs or you may have swelling (edema) in your lower legs. Heart failure is usually a long-term (chronic) condition. It is important for you to take good care of yourself and follow your health care provider's treatment plan. CAUSES  Some health conditions can cause heart failure. Those health conditions include:  High blood pressure (hypertension). Hypertension causes the heart muscle to work harder than normal. When pressure in the blood vessels is high, the heart needs to pump (contract) with more force in order to circulate blood throughout the body. High blood pressure eventually causes the heart to become stiff and weak.  Coronary artery disease (CAD). CAD is the buildup of cholesterol and fat (plaque) in the arteries of the heart. The blockage in the arteries deprives the heart muscle of oxygen and blood. This can cause chest pain and may lead to a heart attack. High blood pressure can also contribute to CAD.  Heart attack (myocardial infarction). A heart attack occurs when one or more arteries in the heart become blocked. The loss of oxygen damages the muscle tissue of  the heart. When this happens, part of the heart muscle dies. The injured tissue does not contract as well and weakens the heart's ability to pump blood.  Abnormal heart valves. When the heart valves do not open and close properly, it can cause heart failure. This makes the heart muscle pump harder to keep the blood flowing.  Heart muscle disease (cardiomyopathy or myocarditis). Heart muscle disease is damage to the heart muscle from a variety of causes. These can include drug or alcohol abuse, infections, or unknown reasons. These can  increase the risk of heart failure.  Lung disease. Lung disease makes the heart work harder because the lungs do not work properly. This can cause a strain on the heart, leading it to fail.  Diabetes. Diabetes increases the risk of heart failure. High blood sugar contributes to high fat (lipid) levels in the blood. Diabetes can also cause slow damage to tiny blood vessels that carry important nutrients to the heart muscle. When the heart does not get enough oxygen and food, it can cause the heart to become weak and stiff. This leads to a heart that does not contract efficiently.  Other conditions can contribute to heart failure. These include abnormal heart rhythms, thyroid problems, and low blood counts (anemia). Certain unhealthy behaviors can increase the risk of heart failure, including:  Being overweight.  Smoking or chewing tobacco.  Eating foods high in fat and cholesterol.  Abusing illicit drugs or alcohol.  Lacking physical activity. SYMPTOMS  Heart failure symptoms may vary and can be hard to detect. Symptoms may include:  Shortness of breath with activity, such as climbing stairs.  Persistent cough.  Swelling of the feet, ankles, legs, or abdomen.  Unexplained weight gain.  Difficulty breathing when lying flat (orthopnea).  Waking from sleep because of the need to sit up and get more air.  Rapid heartbeat.  Fatigue and loss of energy.  Feeling light-headed, dizzy, or close to fainting.  Loss of appetite.  Nausea.  Increased urination during the night (nocturia). DIAGNOSIS  A diagnosis of heart failure is based on your history, symptoms, physical examination, and diagnostic tests. Diagnostic tests for heart failure may include:  Echocardiography.  Electrocardiography.  Chest X-ray.  Blood tests.  Exercise stress test.  Cardiac angiography.  Radionuclide scans. TREATMENT  Treatment is aimed at managing the symptoms of heart failure. Medicines,  behavioral changes, or surgical intervention may be necessary to treat heart failure.  Medicines to help treat heart failure may include:  Angiotensin-converting enzyme (ACE) inhibitors. This type of medicine blocks the effects of a blood protein called angiotensin-converting enzyme. ACE inhibitors relax (dilate) the blood vessels and help lower blood pressure.  Angiotensin receptor blockers (ARBs). This type of medicine blocks the actions of a blood protein called angiotensin. Angiotensin receptor blockers dilate the blood vessels and help lower blood pressure.  Water pills (diuretics). Diuretics cause the kidneys to remove salt and water from the blood. The extra fluid is removed through urination. This loss of extra fluid lowers the volume of blood the heart pumps.  Beta blockers. These prevent the heart from beating too fast and improve heart muscle strength.  Digitalis. This increases the force of the heartbeat.  Healthy behavior changes include:  Obtaining and maintaining a healthy weight.  Stopping smoking or chewing tobacco.  Eating heart-healthy foods.  Limiting or avoiding alcohol.  Stopping illicit drug use.  Physical activity as directed by your health care provider.  Surgical treatment for heart failure may include:  A procedure to open blocked arteries, repair damaged heart valves, or remove damaged heart muscle tissue.  A pacemaker to improve heart muscle function and control certain abnormal heart rhythms.  An internal cardioverter defibrillator to treat certain serious abnormal heart rhythms.  A left ventricular assist device (LVAD) to assist the pumping ability of the heart. HOME CARE INSTRUCTIONS   Take medicines only as directed by your health care provider. Medicines are important in reducing the workload of your heart, slowing the progression of heart failure, and improving your symptoms.  Do not stop taking your medicine unless directed by your health  care provider.  Do not skip any dose of medicine.  Refill your prescriptions before you run out of medicine. Your medicines are needed every day.  Engage in moderate physical activity if directed by your health care provider. Moderate physical activity can benefit some people. The elderly and people with severe heart failure should consult with a health care provider for physical activity recommendations.  Eat heart-healthy foods. Food choices should be free of trans fat and low in saturated fat, cholesterol, and salt (sodium). Healthy choices include fresh or frozen fruits and vegetables, fish, lean meats, legumes, fat-free or low-fat dairy products, and whole grain or high fiber foods. Talk to a dietitian to learn more about heart-healthy foods.  Limit sodium if directed by your health care provider. Sodium restriction may reduce symptoms of heart failure in some people. Talk to a dietitian to learn more about heart-healthy seasonings.  Use healthy cooking methods. Healthy cooking methods include roasting, grilling, broiling, baking, poaching, steaming, or stir-frying. Talk to a dietitian to learn more about healthy cooking methods.  Limit fluids if directed by your health care provider. Fluid restriction may reduce symptoms of heart failure in some people.  Weigh yourself every day. Daily weights are important in the early recognition of excess fluid. You should weigh yourself every morning after you urinate and before you eat breakfast. Wear the same amount of clothing each time you weigh yourself. Record your daily weight. Provide your health care provider with your weight record.  Monitor and record your blood pressure if directed by your health care provider.  Check your pulse if directed by your health care provider.  Lose weight if directed by your health care provider. Weight loss may reduce symptoms of heart failure in some people.  Stop smoking or chewing tobacco. Nicotine makes  your heart work harder by causing your blood vessels to constrict. Do not use nicotine gum or patches before talking to your health care provider.  Keep all follow-up visits as directed by your health care provider. This is important.  Limit alcohol intake to no more than 1 drink per day for nonpregnant women and 2 drinks per day for men. One drink equals 12 ounces of beer, 5 ounces of wine, or 1 ounces of hard liquor. Drinking more than that is harmful to your heart. Tell your health care provider if you drink alcohol several times a week. Talk with your health care provider about whether alcohol is safe for you. If your heart has already been damaged by alcohol or you have severe heart failure, drinking alcohol should be stopped completely.  Stop illicit drug use.  Stay up-to-date with immunizations. It is especially important to prevent respiratory infections through current pneumococcal and influenza immunizations.  Manage other health conditions such as hypertension, diabetes, thyroid disease, or abnormal heart rhythms as directed by your health care provider.  Learn to manage  stress.  Plan rest periods when fatigued.  Learn strategies to manage high temperatures. If the weather is extremely hot:  Avoid vigorous physical activity.  Use air conditioning or fans or seek a cooler location.  Avoid caffeine and alcohol.  Wear loose-fitting, lightweight, and light-colored clothing.  Learn strategies to manage cold temperatures. If the weather is extremely cold:  Avoid vigorous physical activity.  Layer clothes.  Wear mittens or gloves, a hat, and a scarf when going outside.  Avoid alcohol.  Obtain ongoing education and support as needed.  Participate in or seek rehabilitation as needed to maintain or improve independence and quality of life. SEEK MEDICAL CARE IF:   You have a rapid weight gain.  You have increasing shortness of breath that is unusual for you.  You are  unable to participate in your usual physical activities.  You tire easily.  You cough more than normal, especially with physical activity.  You have any or more swelling in areas such as your hands, feet, ankles, or abdomen.  You are unable to sleep because it is hard to breathe.  You feel like your heart is beating fast (palpitations).  You become dizzy or light-headed upon standing up. SEEK IMMEDIATE MEDICAL CARE IF:   You have difficulty breathing.  There is a change in mental status such as decreased alertness or difficulty with concentration.  You have a pain or discomfort in your chest.  You have an episode of fainting (syncope). MAKE SURE YOU:   Understand these instructions.  Will watch your condition.  Will get help right away if you are not doing well or get worse.   This information is not intended to replace advice given to you by your health care provider. Make sure you discuss any questions you have with your health care provider.   Document Released: 06/21/2005 Document Revised: 11/05/2014 Document Reviewed: 07/21/2012 [employer] Interactive Patient Education 2016 [employer]  Heart Failure Heart failure is a condition in which the heart has trouble pumping blood. This means your heart does not pump blood efficiently for your body to work well. In some cases of heart failure, fluid may back up into your lungs or you may have swelling (edema) in your lower legs. Heart failure is usually a long-term (chronic) condition. It is important for you to take good care of yourself and follow your health care provider's treatment plan. CAUSES  Some health conditions can cause heart failure. Those health conditions include:  High blood pressure (hypertension). Hypertension causes the heart muscle to work harder than normal. When pressure in the blood vessels is high, the heart needs to pump (contract) with more force in order to circulate blood throughout the body. High  blood pressure eventually causes the heart to become stiff and weak.  Coronary artery disease (CAD). CAD is the buildup of cholesterol and fat (plaque) in the arteries of the heart. The blockage in the arteries deprives the heart muscle of oxygen and blood. This can cause chest pain and may lead to a heart attack. High blood pressure can also contribute to CAD.  Heart attack (myocardial infarction). A heart attack occurs when one or more arteries in the heart become blocked. The loss of oxygen damages the muscle tissue of the heart. When this happens, part of the heart muscle dies. The injured tissue does not contract as well and weakens the heart's ability to pump blood.  Abnormal heart valves. When the heart valves do not open and close properly, it  can cause heart failure. This makes the heart muscle pump harder to keep the blood flowing.  Heart muscle disease (cardiomyopathy or myocarditis). Heart muscle disease is damage to the heart muscle from a variety of causes. These can include drug or alcohol abuse, infections, or unknown reasons. These can increase the risk of heart failure.  Lung disease. Lung disease makes the heart work harder because the lungs do not work properly. This can cause a strain on the heart, leading it to fail.  Diabetes. Diabetes increases the risk of heart failure. High blood sugar contributes to high fat (lipid) levels in the blood. Diabetes can also cause slow damage to tiny blood vessels that carry important nutrients to the heart muscle. When the heart does not get enough oxygen and food, it can cause the heart to become weak and stiff. This leads to a heart that does not contract efficiently.  Other conditions can contribute to heart failure. These include abnormal heart rhythms, thyroid problems, and low blood counts (anemia). Certain unhealthy behaviors can increase the risk of heart failure, including:  Being overweight.  Smoking or chewing tobacco.  Eating  foods high in fat and cholesterol.  Abusing illicit drugs or alcohol.  Lacking physical activity. SYMPTOMS  Heart failure symptoms may vary and can be hard to detect. Symptoms may include:  Shortness of breath with activity, such as climbing stairs.  Persistent cough.  Swelling of the feet, ankles, legs, or abdomen.  Unexplained weight gain.  Difficulty breathing when lying flat (orthopnea).  Waking from sleep because of the need to sit up and get more air.  Rapid heartbeat.  Fatigue and loss of energy.  Feeling light-headed, dizzy, or close to fainting.  Loss of appetite.  Nausea.  Increased urination during the night (nocturia). DIAGNOSIS  A diagnosis of heart failure is based on your history, symptoms, physical examination, and diagnostic tests. Diagnostic tests for heart failure may include:  Echocardiography.  Electrocardiography.  Chest X-ray.  Blood tests.  Exercise stress test.  Cardiac angiography.  Radionuclide scans. TREATMENT  Treatment is aimed at managing the symptoms of heart failure. Medicines, behavioral changes, or surgical intervention may be necessary to treat heart failure.  Medicines to help treat heart failure may include:  Angiotensin-converting enzyme (ACE) inhibitors. This type of medicine blocks the effects of a blood protein called angiotensin-converting enzyme. ACE inhibitors relax (dilate) the blood vessels and help lower blood pressure.  Angiotensin receptor blockers (ARBs). This type of medicine blocks the actions of a blood protein called angiotensin. Angiotensin receptor blockers dilate the blood vessels and help lower blood pressure.  Water pills (diuretics). Diuretics cause the kidneys to remove salt and water from the blood. The extra fluid is removed through urination. This loss of extra fluid lowers the volume of blood the heart pumps.  Beta blockers. These prevent the heart from beating too fast and improve heart muscle  strength.  Digitalis. This increases the force of the heartbeat.  Healthy behavior changes include:  Obtaining and maintaining a healthy weight.  Stopping smoking or chewing tobacco.  Eating heart-healthy foods.  Limiting or avoiding alcohol.  Stopping illicit drug use.  Physical activity as directed by your health care provider.  Surgical treatment for heart failure may include:  A procedure to open blocked arteries, repair damaged heart valves, or remove damaged heart muscle tissue.  A pacemaker to improve heart muscle function and control certain abnormal heart rhythms.  An internal cardioverter defibrillator to treat certain serious abnormal heart  rhythms.  A left ventricular assist device (LVAD) to assist the pumping ability of the heart. HOME CARE INSTRUCTIONS   Take medicines only as directed by your health care provider. Medicines are important in reducing the workload of your heart, slowing the progression of heart failure, and improving your symptoms.  Do not stop taking your medicine unless directed by your health care provider.  Do not skip any dose of medicine.  Refill your prescriptions before you run out of medicine. Your medicines are needed every day.  Engage in moderate physical activity if directed by your health care provider. Moderate physical activity can benefit some people. The elderly and people with severe heart failure should consult with a health care provider for physical activity recommendations.  Eat heart-healthy foods. Food choices should be free of trans fat and low in saturated fat, cholesterol, and salt (sodium). Healthy choices include fresh or frozen fruits and vegetables, fish, lean meats, legumes, fat-free or low-fat dairy products, and whole grain or high fiber foods. Talk to a dietitian to learn more about heart-healthy foods.  Limit sodium if directed by your health care provider. Sodium restriction may reduce symptoms of heart  failure in some people. Talk to a dietitian to learn more about heart-healthy seasonings.  Use healthy cooking methods. Healthy cooking methods include roasting, grilling, broiling, baking, poaching, steaming, or stir-frying. Talk to a dietitian to learn more about healthy cooking methods.  Limit fluids if directed by your health care provider. Fluid restriction may reduce symptoms of heart failure in some people.  Weigh yourself every day. Daily weights are important in the early recognition of excess fluid. You should weigh yourself every morning after you urinate and before you eat breakfast. Wear the same amount of clothing each time you weigh yourself. Record your daily weight. Provide your health care provider with your weight record.  Monitor and record your blood pressure if directed by your health care provider.  Check your pulse if directed by your health care provider.  Lose weight if directed by your health care provider. Weight loss may reduce symptoms of heart failure in some people.  Stop smoking or chewing tobacco. Nicotine makes your heart work harder by causing your blood vessels to constrict. Do not use nicotine gum or patches before talking to your health care provider.  Keep all follow-up visits as directed by your health care provider. This is important.  Limit alcohol intake to no more than 1 drink per day for nonpregnant women and 2 drinks per day for men. One drink equals 12 ounces of beer, 5 ounces of wine, or 1 ounces of hard liquor. Drinking more than that is harmful to your heart. Tell your health care provider if you drink alcohol several times a week. Talk with your health care provider about whether alcohol is safe for you. If your heart has already been damaged by alcohol or you have severe heart failure, drinking alcohol should be stopped completely.  Stop illicit drug use.  Stay up-to-date with immunizations. It is especially important to prevent respiratory  infections through current pneumococcal and influenza immunizations.  Manage other health conditions such as hypertension, diabetes, thyroid disease, or abnormal heart rhythms as directed by your health care provider.  Learn to manage stress.  Plan rest periods when fatigued.  Learn strategies to manage high temperatures. If the weather is extremely hot:  Avoid vigorous physical activity.  Use air conditioning or fans or seek a cooler location.  Avoid caffeine and alcohol.  Wear loose-fitting, lightweight, and light-colored clothing.  Learn strategies to manage cold temperatures. If the weather is extremely cold:  Avoid vigorous physical activity.  Layer clothes.  Wear mittens or gloves, a hat, and a scarf when going outside.  Avoid alcohol.  Obtain ongoing education and support as needed.  Participate in or seek rehabilitation as needed to maintain or improve independence and quality of life. SEEK MEDICAL CARE IF:   You have a rapid weight gain.  You have increasing shortness of breath that is unusual for you.  You are unable to participate in your usual physical activities.  You tire easily.  You cough more than normal, especially with physical activity.  You have any or more swelling in areas such as your hands, feet, ankles, or abdomen.  You are unable to sleep because it is hard to breathe.  You feel like your heart is beating fast (palpitations).  You become dizzy or light-headed upon standing up. SEEK IMMEDIATE MEDICAL CARE IF:   You have difficulty breathing.  There is a change in mental status such as decreased alertness or difficulty with concentration.  You have a pain or discomfort in your chest.  You have an episode of fainting (syncope). MAKE SURE YOU:   Understand these instructions.  Will watch your condition.  Will get help right away if you are not doing well or get worse.   This information is not intended to replace advice given  to you by your health care provider. Make sure you discuss any questions you have with your health care provider.   Document Released: 06/21/2005 Document Revised: 11/05/2014 Document Reviewed: 07/21/2012 [employer] Interactive Patient Education 2016 [employer]  Heart Failure Heart failure means your heart has trouble pumping blood. This makes it hard for your body to work well. Heart failure is usually a long-term (chronic) condition. You must take good care of yourself and follow your doctor's treatment plan. HOME CARE  Take your heart medicine as told by your doctor. 1. Do not stop taking medicine unless your doctor tells you to. 2. Do not skip any dose of medicine. 3. Refill your medicines before they run out. 4. Take other medicines only as told by your doctor or pharmacist.  Stay active if told by your doctor. The elderly and people with severe heart failure should talk with a doctor about physical activity.  Eat heart-healthy foods. Choose foods that are without trans fat and are low in saturated fat, cholesterol, and salt (sodium). This includes fresh or frozen fruits and vegetables, fish, lean meats, fat-free or low-fat dairy foods, whole grains, and high-fiber foods. Lentils and dried peas and beans (legumes) are also good choices.  Limit salt if told by your doctor.  Cook in a healthy way. Roast, grill, broil, bake, poach, steam, or stir-fry foods.  Limit fluids as told by your doctor.  Weigh yourself every morning. Do this after you pee (urinate) and before you eat breakfast. Write down your weight to give to your doctor.  Take your blood pressure and write it down if your doctor tells you to.  Ask your doctor how to check your pulse. Check your pulse as told.  Lose weight if told by your doctor.  Stop smoking or chewing tobacco. Do not use gum or patches that help you quit without your doctor's approval.  Schedule and go to doctor visits as told.  Nonpregnant women  should have no more than 1 drink a day. Men should have no more  than 2 drinks a day. Talk to your doctor about drinking alcohol.  Stop illegal drug use.  Stay current with shots (immunizations).  Manage your health conditions as told by your doctor.  Learn to manage your stress.  Rest when you are tired.  If it is really hot outside: 1. Avoid intense activities. 2. Use air conditioning or fans, or get in a cooler place. 3. Avoid caffeine and alcohol. 4. Wear loose-fitting, lightweight, and light-colored clothing.  If it is really cold outside: 1. Avoid intense activities. 2. Layer your clothing. 3. Wear mittens or gloves, a hat, and a scarf when going outside. 4. Avoid alcohol.  Learn about heart failure and get support as needed.  Get help to maintain or improve your quality of life and your ability to care for yourself as needed. GET HELP IF:   You gain weight quickly.  You are more short of breath than usual.  You cannot do your normal activities.  You tire easily.  You cough more than normal, especially with activity.  You have any or more puffiness (swelling) in areas such as your hands, feet, ankles, or belly (abdomen).  You cannot sleep because it is hard to breathe.  You feel like your heart is beating fast (palpitations).  You get dizzy or light-headed when you stand up. GET HELP RIGHT AWAY IF:   You have trouble breathing.  There is a change in mental status, such as becoming less alert or not being able to focus.  You have chest pain or discomfort.  You faint. MAKE SURE YOU:   Understand these instructions.  Will watch your condition.  Will get help right away if you are not doing well or get worse.   This information is not intended to replace advice given to you by your health care provider. Make sure you discuss any questions you have with your health care provider.   Document Released: 03/30/2008 Document Revised: 07/12/2014 Document  Reviewed: 08/07/2012 Elsevier Interactive Patient Education 2016 [employer]  Heart Failure Heart failure is a condition in which the heart has trouble pumping blood. This means your heart does not pump blood efficiently for your body to work well. In some cases of heart failure, fluid may back up into your lungs or you may have swelling (edema) in your lower legs. Heart failure is usually a long-term (chronic) condition. It is important for you to take good care of yourself and follow your health care provider's treatment plan. CAUSES  Some health conditions can cause heart failure. Those health conditions include:  High blood pressure (hypertension). Hypertension causes the heart muscle to work harder than normal. When pressure in the blood vessels is high, the heart needs to pump (contract) with more force in order to circulate blood throughout the body. High blood pressure eventually causes the heart to become stiff and weak.  Coronary artery disease (CAD). CAD is the buildup of cholesterol and fat (plaque) in the arteries of the heart. The blockage in the arteries deprives the heart muscle of oxygen and blood. This can cause chest pain and may lead to a heart attack. High blood pressure can also contribute to CAD.  Heart attack (myocardial infarction). A heart attack occurs when one or more arteries in the heart become blocked. The loss of oxygen damages the muscle tissue of the heart. When this happens, part of the heart muscle dies. The injured tissue does not contract as well and weakens the heart's ability to pump blood.  Abnormal heart valves. When the heart valves do not open and close properly, it can cause heart failure. This makes the heart muscle pump harder to keep the blood flowing.  Heart muscle disease (cardiomyopathy or myocarditis). Heart muscle disease is damage to the heart muscle from a variety of causes. These can include drug or alcohol abuse, infections, or unknown  reasons. These can increase the risk of heart failure.  Lung disease. Lung disease makes the heart work harder because the lungs do not work properly. This can cause a strain on the heart, leading it to fail.  Diabetes. Diabetes increases the risk of heart failure. High blood sugar contributes to high fat (lipid) levels in the blood. Diabetes can also cause slow damage to tiny blood vessels that carry important nutrients to the heart muscle. When the heart does not get enough oxygen and food, it can cause the heart to become weak and stiff. This leads to a heart that does not contract efficiently.  Other conditions can contribute to heart failure. These include abnormal heart rhythms, thyroid problems, and low blood counts (anemia). Certain unhealthy behaviors can increase the risk of heart failure, including:  Being overweight.  Smoking or chewing tobacco.  Eating foods high in fat and cholesterol.  Abusing illicit drugs or alcohol.  Lacking physical activity. SYMPTOMS  Heart failure symptoms may vary and can be hard to detect. Symptoms may include:  Shortness of breath with activity, such as climbing stairs.  Persistent cough.  Swelling of the feet, ankles, legs, or abdomen.  Unexplained weight gain.  Difficulty breathing when lying flat (orthopnea).  Waking from sleep because of the need to sit up and get more air.  Rapid heartbeat.  Fatigue and loss of energy.  Feeling light-headed, dizzy, or close to fainting.  Loss of appetite.  Nausea.  Increased urination during the night (nocturia). DIAGNOSIS  A diagnosis of heart failure is based on your history, symptoms, physical examination, and diagnostic tests. Diagnostic tests for heart failure may include:  Echocardiography.  Electrocardiography.  Chest X-ray.  Blood tests.  Exercise stress test.  Cardiac angiography.  Radionuclide scans. TREATMENT  Treatment is aimed at managing the symptoms of heart  failure. Medicines, behavioral changes, or surgical intervention may be necessary to treat heart failure.  Medicines to help treat heart failure may include:  Angiotensin-converting enzyme (ACE) inhibitors. This type of medicine blocks the effects of a blood protein called angiotensin-converting enzyme. ACE inhibitors relax (dilate) the blood vessels and help lower blood pressure.  Angiotensin receptor blockers (ARBs). This type of medicine blocks the actions of a blood protein called angiotensin. Angiotensin receptor blockers dilate the blood vessels and help lower blood pressure.  Water pills (diuretics). Diuretics cause the kidneys to remove salt and water from the blood. The extra fluid is removed through urination. This loss of extra fluid lowers the volume of blood the heart pumps.  Beta blockers. These prevent the heart from beating too fast and improve heart muscle strength.  Digitalis. This increases the force of the heartbeat.  Healthy behavior changes include:  Obtaining and maintaining a healthy weight.  Stopping smoking or chewing tobacco.  Eating heart-healthy foods.  Limiting or avoiding alcohol.  Stopping illicit drug use.  Physical activity as directed by your health care provider.  Surgical treatment for heart failure may include:  A procedure to open blocked arteries, repair damaged heart valves, or remove damaged heart muscle tissue.  A pacemaker to improve heart muscle function and control certain  abnormal heart rhythms.  An internal cardioverter defibrillator to treat certain serious abnormal heart rhythms.  A left ventricular assist device (LVAD) to assist the pumping ability of the heart. HOME CARE INSTRUCTIONS   Take medicines only as directed by your health care provider. Medicines are important in reducing the workload of your heart, slowing the progression of heart failure, and improving your symptoms.  Do not stop taking your medicine unless  directed by your health care provider.  Do not skip any dose of medicine.  Refill your prescriptions before you run out of medicine. Your medicines are needed every day.  Engage in moderate physical activity if directed by your health care provider. Moderate physical activity can benefit some people. The elderly and people with severe heart failure should consult with a health care provider for physical activity recommendations.  Eat heart-healthy foods. Food choices should be free of trans fat and low in saturated fat, cholesterol, and salt (sodium). Healthy choices include fresh or frozen fruits and vegetables, fish, lean meats, legumes, fat-free or low-fat dairy products, and whole grain or high fiber foods. Talk to a dietitian to learn more about heart-healthy foods.  Limit sodium if directed by your health care provider. Sodium restriction may reduce symptoms of heart failure in some people. Talk to a dietitian to learn more about heart-healthy seasonings.  Use healthy cooking methods. Healthy cooking methods include roasting, grilling, broiling, baking, poaching, steaming, or stir-frying. Talk to a dietitian to learn more about healthy cooking methods.  Limit fluids if directed by your health care provider. Fluid restriction may reduce symptoms of heart failure in some people.  Weigh yourself every day. Daily weights are important in the early recognition of excess fluid. You should weigh yourself every morning after you urinate and before you eat breakfast. Wear the same amount of clothing each time you weigh yourself. Record your daily weight. Provide your health care provider with your weight record.  Monitor and record your blood pressure if directed by your health care provider.  Check your pulse if directed by your health care provider.  Lose weight if directed by your health care provider. Weight loss may reduce symptoms of heart failure in some people.  Stop smoking or chewing  tobacco. Nicotine makes your heart work harder by causing your blood vessels to constrict. Do not use nicotine gum or patches before talking to your health care provider.  Keep all follow-up visits as directed by your health care provider. This is important.  Limit alcohol intake to no more than 1 drink per day for nonpregnant women and 2 drinks per day for men. One drink equals 12 ounces of beer, 5 ounces of wine, or 1 ounces of hard liquor. Drinking more than that is harmful to your heart. Tell your health care provider if you drink alcohol several times a week. Talk with your health care provider about whether alcohol is safe for you. If your heart has already been damaged by alcohol or you have severe heart failure, drinking alcohol should be stopped completely.  Stop illicit drug use.  Stay up-to-date with immunizations. It is especially important to prevent respiratory infections through current pneumococcal and influenza immunizations.  Manage other health conditions such as hypertension, diabetes, thyroid disease, or abnormal heart rhythms as directed by your health care provider.  Learn to manage stress.  Plan rest periods when fatigued.  Learn strategies to manage high temperatures. If the weather is extremely hot:  Avoid vigorous physical activity.  Use  air conditioning or fans or seek a cooler location.  Avoid caffeine and alcohol.  Wear loose-fitting, lightweight, and light-colored clothing.  Learn strategies to manage cold temperatures. If the weather is extremely cold:  Avoid vigorous physical activity.  Layer clothes.  Wear mittens or gloves, a hat, and a scarf when going outside.  Avoid alcohol.  Obtain ongoing education and support as needed.  Participate in or seek rehabilitation as needed to maintain or improve independence and quality of life. SEEK MEDICAL CARE IF:   You have a rapid weight gain.  You have increasing shortness of breath that is unusual  for you.  You are unable to participate in your usual physical activities.  You tire easily.  You cough more than normal, especially with physical activity.  You have any or more swelling in areas such as your hands, feet, ankles, or abdomen.  You are unable to sleep because it is hard to breathe.  You feel like your heart is beating fast (palpitations).  You become dizzy or light-headed upon standing up. SEEK IMMEDIATE MEDICAL CARE IF:   You have difficulty breathing.  There is a change in mental status such as decreased alertness or difficulty with concentration.  You have a pain or discomfort in your chest.  You have an episode of fainting (syncope). MAKE SURE YOU:   Understand these instructions.  Will watch your condition.  Will get help right away if you are not doing well or get worse.   This information is not intended to replace advice given to you by your health care provider. Make sure you discuss any questions you have with your health care provider.   Document Released: 06/21/2005 Document Revised: 11/05/2014 Document Reviewed: 07/21/2012 Elsevier Interactive Patient Education 2016 [employer].    DIET:  Cardiac diet  DISCHARGE CONDITION:  Stable  ACTIVITY:  Activity as tolerated  OXYGEN:  Home Oxygen: No.   Oxygen Delivery: room air  DISCHARGE LOCATION:  home    ADDITIONAL DISCHARGE INSTRUCTION:be carefull with salt intake   If you experience worsening of your admission symptoms, develop shortness of breath, life threatening emergency, suicidal or homicidal thoughts you must seek medical attention immediately by calling 911 or calling your MD immediately  if symptoms less severe.  You Must read complete instructions/literature along with all the possible adverse reactions/side effects for all the Medicines you take and that have been prescribed to you. Take any new Medicines after you have completely understood and accpet all the possible  adverse reactions/side effects.   Please note  You were cared for by a hospitalist during your hospital stay. If you have any questions about your discharge medications or the care you received while you were in the hospital after you are discharged, you can call the unit and asked to speak with the hospitalist on call if the hospitalist that took care of you is not available. Once you are discharged, your primary care physician will handle any further medical issues. Please note that NO REFILLS for any discharge medications will be authorized once you are discharged, as it is imperative that you return to your primary care physician (or establish a relationship with a primary care physician if you do not have one) for your aftercare needs so that they can reassess your need for medications and monitor your lab values.

## 2015-06-18 ENCOUNTER — Ambulatory Visit: Payer: No Typology Code available for payment source | Admitting: Nurse Practitioner

## 2015-07-03 ENCOUNTER — Ambulatory Visit: Payer: No Typology Code available for payment source | Admitting: Family

## 2019-04-05 HISTORY — PX: BACK SURGERY: SHX140

## 2019-04-27 ENCOUNTER — Emergency Department: Payer: No Typology Code available for payment source

## 2019-04-27 ENCOUNTER — Encounter: Payer: Self-pay | Admitting: Emergency Medicine

## 2019-04-27 ENCOUNTER — Emergency Department
Admission: EM | Admit: 2019-04-27 | Discharge: 2019-04-27 | Disposition: A | Discharge status: Home / Self Care | Payer: No Typology Code available for payment source | Attending: Emergency Medicine | Admitting: Emergency Medicine

## 2019-04-27 ENCOUNTER — Other Ambulatory Visit: Payer: Self-pay

## 2019-04-27 DIAGNOSIS — M4716 Other spondylosis with myelopathy, lumbar region: Secondary | ICD-10-CM | POA: Insufficient documentation

## 2019-04-27 DIAGNOSIS — I110 Hypertensive heart disease with heart failure: Secondary | ICD-10-CM | POA: Insufficient documentation

## 2019-04-27 DIAGNOSIS — Z87891 Personal history of nicotine dependence: Secondary | ICD-10-CM | POA: Insufficient documentation

## 2019-04-27 DIAGNOSIS — I5022 Chronic systolic (congestive) heart failure: Secondary | ICD-10-CM | POA: Insufficient documentation

## 2019-04-27 DIAGNOSIS — Z79899 Other long term (current) drug therapy: Secondary | ICD-10-CM | POA: Insufficient documentation

## 2019-04-27 DIAGNOSIS — I2510 Atherosclerotic heart disease of native coronary artery without angina pectoris: Secondary | ICD-10-CM | POA: Insufficient documentation

## 2019-04-27 DIAGNOSIS — Z7982 Long term (current) use of aspirin: Secondary | ICD-10-CM | POA: Insufficient documentation

## 2019-04-27 DIAGNOSIS — Z7901 Long term (current) use of anticoagulants: Secondary | ICD-10-CM | POA: Insufficient documentation

## 2019-04-27 MED ORDER — LIDOCAINE 5 % EX PTCH
1.0000 | MEDICATED_PATCH | CUTANEOUS | Status: DC
  Administered 2019-04-27: 1 via TRANSDERMAL
  Filled 2019-04-27: qty 1

## 2019-04-27 MED ORDER — FUROSEMIDE 20 MG PO TABS: 20.0000 mg | ORAL_TABLET | Freq: Every day | ORAL | 0 refills | Status: DC

## 2019-04-27 MED ORDER — TRAMADOL HCL 50 MG PO TABS: 50.0000 mg | ORAL_TABLET | Freq: Two times a day (BID) | ORAL | 0 refills | Status: DC | PRN

## 2019-04-27 NOTE — Discharge Instructions (Addendum)
Advised to contact the heart failure clinic and orthopedic clinic for definitive evaluation and treatment of low back pain and intermittent dyspnea.

## 2019-04-27 NOTE — ED Notes (Signed)
See triage note  Presents with lower back pain which started about 1 month ago  Denies any injury  States over the past 2-3 days pain has gotten worse  Radiates into left leg  States his leg gives away at times

## 2019-04-27 NOTE — ED Provider Notes (Signed)
[facility] Emergency Department Provider Note   ____________________________________________   First MD Initiated Contact with Patient 04/27/19 1214     (approximate)  I have reviewed the triage vital signs and the nursing notes.   HISTORY  Chief Complaint Back Pain    HPI [name]. is a 63 y.o. patient complain low back pain for 1 month.  Said complaint is worse in the past 2 to 3 days.  Patient states sacral fracture for low back pain 1 month ago with no improvement.  Patient denies bladder bowel dysfunction.  Patient states of radicular component to the left lower extremity.  Patient states that leg gives out on him and he also has numbness/tingling.  It was noticed on entry of the patient had intermittent bradycardic and an EKG was performed.  Patient also states he has mild dyspnea secondary to CHF.  Patient requesting refill of Lasix.  Patient rates his pain as a 10/10.  Patient describes his pain is "aching".  No palliative measure for complaint.     Past Medical History:  Diagnosis Date  . CAD (coronary artery disease)    a. s/p MI with LAD and Diag stenting @ [name];  b. 06/2015 Cath: LM nl, LAD 51m/d ISR, D1 30 ISR, RI min irregs, LCX 44m, OM1 min irregs, OM2 60, OM3 min irregs, RCA 100p/m ISR, 70d, L->R collats, EDP 18mmHg-->Med Rx.  . Chronic combined systolic and diastolic CHF (congestive heart failure) (HCC)    a. 06/2015 Echo: EF 20-25%, Gr3 DD.  [name] Essential hypertension   . Hyperlipidemia   . Hypokalemia    a. 06/2015 in setting of diuresis.  . Ischemic cardiomyopathy    a. 2011 EF 45% ([name]);  b. 06/2015 Echo: EF 20-25%, sev diff HK w/ inf/inflat AK, Gr 3 DD, mod MR, sev dil LA, mod dil RA, mild TR.    Patient Active Problem List   Diagnosis Date Noted  . Chronic combined systolic and diastolic CHF (congestive heart failure) (HCC)   . Ischemic cardiomyopathy   . Acute systolic CHF (congestive heart failure) (HCC) 06/10/2015   . Unstable angina (HCC) 06/10/2015  . CAD (coronary artery disease) 06/10/2015  . Acute on chronic systolic (congestive) heart failure (HCC) 06/10/2015  . Essential hypertension   . Hyperlipidemia   . CHF (congestive heart failure) (HCC) 06/07/2015    Past Surgical History:  Procedure Laterality Date  . CARDIAC CATHETERIZATION N/A 06/10/2015   Procedure: Left Heart Cath;  Surgeon: Iran Ouch, MD;  Location: [facility] INVASIVE CV LAB;  Service: Cardiovascular;  Laterality: N/A;  . CORONARY STENT PLACEMENT      Prior to Admission medications   Medication Sig Start Date End Date Taking? Authorizing Provider  aspirin 81 MG chewable tablet Chew 1 tablet (81 mg total) by mouth daily. 06/11/15   Auburn Bilberry, MD  clopidogrel (PLAVIX) 75 MG tablet Take 75 mg by mouth daily.    [provider]  furosemide (LASIX) 20 MG tablet Take 1 tablet (20 mg total) by mouth daily. 04/27/19 04/26/20  Joni Reining, PA-C  isosorbide dinitrate (ISORDIL) 30 MG tablet Take 30 mg by mouth 4 (four) times daily.    [provider]  traMADol (ULTRAM) 50 MG tablet Take 1 tablet (50 mg total) by mouth every 12 (twelve) hours as needed. 04/27/19   Joni Reining, PA-C    Allergies Contrast media [iodinated diagnostic agents], Hydrocodone, Iohexol, Morphine and related, and Penicillins  Family History  Problem Relation Age of Onset  . Coronary artery disease Mother   . Diabetes Mother   . Coronary artery disease Father     Social History Social History   Tobacco Use  . Smoking status: Former Smoker    Quit date: 06/10/2011    Years since quitting: 7.8  Substance Use Topics  . Alcohol use: No    Alcohol/week: 0.0 standard drinks  . Drug use: No    Review of Systems  Constitutional: No fever/chills Eyes: No visual changes. ENT: No sore throat. Cardiovascular: Denies chest pain. Respiratory: Denies shortness of breath. Gastrointestinal: No abdominal pain.  No nausea, no  vomiting.  No diarrhea.  No constipation. Genitourinary: Negative for dysuria. Musculoskeletal: Positive for back pain. Skin: Negative for rash. Neurological: Negative for headaches, focal weakness or numbness. Endocrine:  Hypertension  allergic/Immunilogical: See medication allergy list. ____________________________________________   PHYSICAL EXAM:  VITAL SIGNS: ED Triage Vitals  Enc Vitals Group     BP 04/27/19 1057 (!) 167/76     Pulse Rate 04/27/19 1057 (!) 58     Resp 04/27/19 1057 18     Temp 04/27/19 1057 98 F (36.7 C)     Temp Source 04/27/19 1057 Oral     SpO2 04/27/19 1057 97 %     Weight 04/27/19 1055 260 lb (117.9 kg)     Height 04/27/19 1055 5\' 6"  (1.676 m)     Head Circumference --      Peak Flow --      Pain Score 04/27/19 1055 10     Pain Loc --      Pain Edu? --      Excl. in GC? --     Constitutional: Alert and oriented. Well appearing and in no acute distress. Hematological/Lymphatic/Immunilogical: No cervical lymphadenopathy. Cardiovascular: Normal rate, regular rhythm. Grossly normal heart sounds.  Good peripheral circulation.  Elevated blood pressure Respiratory: Normal respiratory effort.  No retractions. Lungs CTAB. Musculoskeletal: No obvious spinal deformity.  Patient is moderate guarding palpation of the lumbar spine.  Patient decreased range of motion tension in flexion.  Patient negative straight leg test in supine position.  Neurologic:  Normal speech and language. No gross focal neurologic deficits are appreciated. No gait instability. Skin:  Skin is warm, dry and intact. No rash noted. Psychiatric: Mood and affect are normal. Speech and behavior are normal.  ____________________________________________   LABS (all labs ordered are listed, but only abnormal results are displayed)  Labs Reviewed - No data to display ____________________________________________  EKG  EKG read by heart station Dr.  ____________________________________________  RADIOLOGY  ED MD interpretation:    Official radiology report(s): Dg Lumbar Spine Complete  Result Date: 04/27/2019 CLINICAL DATA:  Back pain radiating to LEFT leg for 2 weeks, grinding sensation in back EXAM: LUMBAR SPINE - COMPLETE 4+ VIEW COMPARISON:  None FINDINGS: Osseous demineralization. Five non-rib-bearing lumbar vertebra. Vertebral body heights maintained. No fracture, subluxation or bone destruction. Multilevel disc space narrowing and endplate spur formation at lower thoracic spine, T12-L1, L1-L2, L2-L3, minimally L5-S1. No spondylolysis. Facet degenerative changes lower lumbar spine. SI joints preserved. Atherosclerotic calcification aorta. IMPRESSION: Degenerative disc and facet disease changes of the lumbar spine as above. No acute abnormalities. Electronically Signed   By: 04/29/2019 [name]   On: 04/27/2019 11:45    ____________________________________________   PROCEDURES  Procedure(s) performed (including Critical Care):  Procedures   ____________________________________________   INITIAL IMPRESSION / ASSESSMENT AND PLAN / ED COURSE  As part of  my medical decision making, I reviewed the following data within the [place]. was evaluated in Emergency Department on 04/27/2019 for the symptoms described in the history of present illness. He was evaluated in the context of the global COVID-19 pandemic, which necessitated consideration that the patient might be at risk for infection with the SARS-CoV-2 virus that causes COVID-19. Institutional protocols and algorithms that pertain to the evaluation of patients at risk for COVID-19 are in a state of rapid change based on information released by regulatory bodies including the CDC and federal and state organizations. These policies and algorithms were followed during the patient's care in the ED.  Patient presents with low back pain secondary to  moderate degenerative change in lumbar and thoracic spine.  Discussed x-ray findings with patient.  Patient follow-up with orthopedics.  Patient also be referred to heart failure clinic for evaluation of CHF.  Take medication as directed.               ____________________________________________   FINAL CLINICAL IMPRESSION(S) / ED DIAGNOSES  Final diagnoses:  Osteoarthritis of lumbar spine with myelopathy  Chronic systolic congestive heart failure [facility])     ED Discharge Orders         Ordered    traMADol (ULTRAM) 50 MG tablet  Every 12 hours PRN     04/27/19 1230    furosemide (LASIX) 20 MG tablet  Daily     04/27/19 1230           Note:  This document was prepared using Dragon voice recognition software and may include unintentional dictation errors.    Sable Feil, PA-C 04/27/19 1240    Duffy Bruce, MD 04/27/19 [phone]

## 2019-04-27 NOTE — ED Triage Notes (Signed)
Pt reports went to chiropractor for low back pain one month ago but since pain has been severe, worse over last 2-3 days.  Pt denies fever or loss bowel or bladder. Has had severe weakness in left leg where it gives out and he has fallen multiple times over last few days.  Also has numbness/tingling in left leg.  HR appears intermittent brady on monitor so EKG done.  Pt denies cardiac sx at this time.

## 2019-04-28 NOTE — Progress Notes (Signed)
Patient ID: [name]., male    DOB: 1955-12-14, 63 y.o.   MRN: [id number]  HPI  [name] is a 63 y/o male with a history of CAD, HTN, hyperlipidemia, hypokalemia, previous tobacco use and chronic heart failure.   Echo report from 06/07/15 reviewed and showed an EF of 20-25% along with moderate Glen and mild TR.   Catheterization done 06/10/15 showed:  Prox RCA to Mid RCA lesion, 100% stenosed. The lesion was previously treated with a stent (unknown type) greater than two years ago.  Dist RCA lesion, 70% stenosed.  Mid Cx lesion, 30% stenosed.  2nd Mrg lesion, 60% stenosed.  Mid LAD to Dist LAD lesion, 20% stenosed. The lesion was previously treated with a stent (unknown type).  1st Diag lesion, 30% stenosed. The lesion was previously treated with a stent (unknown type).   1. Widely patent stents in the LAD/diagonal. Chronically occluded RCA stents with extensive left-to-right collaterals and bridging collaterals. 2. Mildly elevated left ventricular end-diastolic pressure. Severely reduced LV systolic function by echocardiogram.  Was in the ED 04/27/2019 due to back pain and shortness of breath due to HF. Treated and released.   He presents today for his initial visit with a chief complaint of moderate shortness of breath upon minimal exertion. He describes this as chronic having been present for several years. He has associated fatigue, palpitations, light-headedness and chronic back pain along with this. He denies any difficulty sleeping, abdominal distention, pedal edema, chest pain or cough.   Does not have scales so hasn't been weighing himself daily. Saw cardiology [facility]) "long time ago".   Past Medical History:  Diagnosis Date  . CAD (coronary artery disease)    a. s/p MI with LAD and Diag stenting @ [name];  b. 06/2015 Cath: LM nl, LAD 17m/d ISR, D1 30 ISR, RI min irregs, LCX 53m, OM1 min irregs, OM2 60, OM3 min irregs, RCA 100p/m ISR, 70d, L->R collats, EDP 24mmHg-->Med  Rx.  . Chronic combined systolic and diastolic CHF (congestive heart failure) (H[facility])    a. 06/2015 Echo: EF 20-25%, Gr3 DD.  [name] Essential hypertension   . Hyperlipidemia   . Hypokalemia    a. 06/2015 in setting of diuresis.  . Ischemic cardiomyopathy    a. 2011 EF 45% ([name]);  b. 06/2015 Echo: EF 20-25%, sev diff HK w/ inf/inflat AK, Gr 3 DD, mod Glen, sev dil LA, mod dil RA, mild TR.   Past Surgical History:  Procedure Laterality Date  . CARDIAC CATHETERIZATION N/A 06/10/2015   Procedure: Left Heart Cath;  Surgeon: Iran Ouch, MD;  Location: [facility];  Service: Cardiovascular;  Laterality: N/A;  . CORONARY STENT PLACEMENT     Family History  Problem Relation Age of Onset  . Coronary artery disease Mother   . Diabetes Mother   . Coronary artery disease Father    Social History   Tobacco Use  . Smoking status: Former Smoker    Quit date: 06/10/2011    Years since quitting: 7.8  . Smokeless tobacco: Never Used  Substance Use Topics  . Alcohol use: No    Alcohol/week: 0.0 standard drinks   Allergies  Allergen Reactions  . Contrast Media [Iodinated Diagnostic Agents] Shortness Of Breath  . Hydrocodone Itching  . Iohexol      Onset Date: 37342876   . Morphine And Related   . Penicillins    Prior to Admission medications   Medication Sig Start Date End Date Taking?  Authorizing Provider  furosemide (LASIX) 20 MG tablet Take 1 tablet (20 mg total) by mouth daily. 04/27/19 04/26/20 Yes Joni Reining, PA-C  traMADol (ULTRAM) 50 MG tablet Take 1 tablet (50 mg total) by mouth every 12 (twelve) hours as needed. 04/27/19  Yes Joni Reining, PA-C  aspirin 81 MG chewable tablet Chew 1 tablet (81 mg total) by mouth daily. Patient not taking: Reported on 04/30/2019 06/11/15   Auburn Bilberry, MD     Review of Systems  Constitutional: Positive for fatigue (easily). Negative for appetite change.  HENT: Positive for congestion and hearing loss. Negative for postnasal  drip and sneezing.   Eyes: Negative.   Respiratory: Positive for shortness of breath (easily). Negative for cough.   Cardiovascular: Positive for palpitations. Negative for chest pain and leg swelling.  Gastrointestinal: Negative for abdominal distention and abdominal pain.  Endocrine: Negative.   Genitourinary: Negative.   Musculoskeletal: Positive for back pain. Negative for neck pain.  Skin: Negative.   Allergic/Immunologic: Negative.   Neurological: Positive for light-headedness. Negative for dizziness.  Hematological: Negative for adenopathy. Does not bruise/bleed easily.  Psychiatric/Behavioral: Negative for dysphoric mood and sleep disturbance (sleeping on 1 pillow). The patient is not nervous/anxious.    Vitals:   04/30/19 1037  BP: (!) 148/103  Pulse: 73  Resp: 18  SpO2: 97%  Weight: 199 lb (90.3 kg)  Height: 5\' 6"  (1.676 m)   Wt Readings from Last 3 Encounters:  04/30/19 199 lb (90.3 kg)  04/27/19 260 lb (117.9 kg)  06/07/15 213 lb 12.8 oz (97 kg)   Lab Results  Component Value Date   CREATININE 0.92 06/10/2015   CREATININE 1.06 06/08/2015   CREATININE 1.21 06/07/2015     Physical Exam Vitals signs and nursing note reviewed.  Constitutional:      Appearance: He is well-developed.  HENT:     Head: Normocephalic and atraumatic.     Right Ear: Decreased hearing noted.     Left Ear: Decreased hearing noted.  Neck:     Musculoskeletal: Normal range of motion and neck supple.     Vascular: No JVD.  Cardiovascular:     Rate and Rhythm: Normal rate and regular rhythm.  Pulmonary:     Effort: Pulmonary effort is normal. No respiratory distress.     Breath sounds: No wheezing or rales.  Abdominal:     Palpations: Abdomen is soft.     Tenderness: There is no abdominal tenderness.  Musculoskeletal:     Right lower leg: He exhibits no tenderness. No edema.     Left lower leg: He exhibits no tenderness. No edema.  Skin:    General: Skin is warm and dry.   Neurological:     General: No focal deficit present.     Mental Status: He is alert and oriented to person, place, and time.  Psychiatric:        Mood and Affect: Mood normal.        Behavior: Behavior normal.     Assessment & Plan:  1: Chronic heart failure with reduced ejection fraction- - NYHA class III - euvolemic today - scales given to him today and he was instructed to weigh every morning after using the bathroom and call for an overnight weight gain of >2 pounds or a weekly weight gain of >5 pounds - not adding salt to his food but does eat out "often" as he lives alone; eats at [employer] or [employer] often and is aware that  foods with sauces have more sodium. Says that [employer] doesn't add additional salt to his food - last echo was done 2016 so this was scheduled for next week - will add entresto 24/26mg  BID; voucher given to patient to get the first 30 days free and patient is to call us if his projected copay will be too much for him - will get BMP at his next visit since starting entresto today - plan to uptitrate as able along with adding carvedilol and/or spironolactine/ or farxiga - in 2016, he saw [facility] cardiology while admitted, will probably need follow-up with them since he's had a previous stent placed - last BNP was 06/07/15 and it was 871.0  2: HTN- - BP elevated today; adding entresto per above - hasn't seen PCP Hall Busing) in years; instructed him to call his office and get an appointment scheduled.  - BMP 06/10/15 reviewed and showed sodium 135, potassium 3.4, creatinine 0.92 and GFR >60  Medication bottles were reviewed.   Return in 1 month or sooner for any questions/problems before then.

## 2019-04-30 ENCOUNTER — Other Ambulatory Visit: Payer: Self-pay

## 2019-04-30 ENCOUNTER — Ambulatory Visit: Payer: Self-pay | Attending: Family | Admitting: Family

## 2019-04-30 ENCOUNTER — Encounter: Payer: Self-pay | Admitting: Family

## 2019-04-30 VITALS — BP 148/103 | HR 73 | Resp 18 | Ht 66.0 in | Wt 199.0 lb

## 2019-04-30 DIAGNOSIS — Z885 Allergy status to narcotic agent status: Secondary | ICD-10-CM | POA: Insufficient documentation

## 2019-04-30 DIAGNOSIS — Z91041 Radiographic dye allergy status: Secondary | ICD-10-CM | POA: Insufficient documentation

## 2019-04-30 DIAGNOSIS — I5022 Chronic systolic (congestive) heart failure: Secondary | ICD-10-CM | POA: Insufficient documentation

## 2019-04-30 DIAGNOSIS — I10 Essential (primary) hypertension: Secondary | ICD-10-CM

## 2019-04-30 DIAGNOSIS — Z955 Presence of coronary angioplasty implant and graft: Secondary | ICD-10-CM | POA: Insufficient documentation

## 2019-04-30 DIAGNOSIS — Z880 Allergy status to penicillin: Secondary | ICD-10-CM | POA: Insufficient documentation

## 2019-04-30 DIAGNOSIS — Z79899 Other long term (current) drug therapy: Secondary | ICD-10-CM | POA: Insufficient documentation

## 2019-04-30 DIAGNOSIS — I110 Hypertensive heart disease with heart failure: Secondary | ICD-10-CM | POA: Insufficient documentation

## 2019-04-30 DIAGNOSIS — E785 Hyperlipidemia, unspecified: Secondary | ICD-10-CM | POA: Insufficient documentation

## 2019-04-30 DIAGNOSIS — Z8249 Family history of ischemic heart disease and other diseases of the circulatory system: Secondary | ICD-10-CM | POA: Insufficient documentation

## 2019-04-30 DIAGNOSIS — I2510 Atherosclerotic heart disease of native coronary artery without angina pectoris: Secondary | ICD-10-CM | POA: Insufficient documentation

## 2019-04-30 DIAGNOSIS — Z833 Family history of diabetes mellitus: Secondary | ICD-10-CM | POA: Insufficient documentation

## 2019-04-30 DIAGNOSIS — I252 Old myocardial infarction: Secondary | ICD-10-CM | POA: Insufficient documentation

## 2019-04-30 DIAGNOSIS — Z87891 Personal history of nicotine dependence: Secondary | ICD-10-CM | POA: Insufficient documentation

## 2019-04-30 MED ORDER — SACUBITRIL-VALSARTAN 24-26 MG PO TABS: 1.0000 | ORAL_TABLET | Freq: Two times a day (BID) | ORAL | 3 refills | Status: DC

## 2019-04-30 NOTE — Patient Instructions (Addendum)
Start weighing daily and call for an overnight weight gain of > 2 pounds or a weekly weight gain of >5 pounds.  

## 2019-05-10 ENCOUNTER — Other Ambulatory Visit: Payer: Self-pay

## 2019-05-10 ENCOUNTER — Ambulatory Visit: Admission: RE | Admit: 2019-05-10 | Payer: Self-pay | Source: Ambulatory Visit

## 2019-05-26 NOTE — Progress Notes (Deleted)
Patient ID: [name]., male    DOB: [birth year], 63 y.o.   MRN: [id number]  HPI  Mr Slauson is a 63 y/o male with a history of CAD, HTN, hyperlipidemia, hypokalemia, previous tobacco use and chronic heart failure.   Echo report from 05/03/2019 reviewed and showed an EF of 40-45% along with mild MR. Echo report from 06/07/15 reviewed and showed an EF of 20-25% along with moderate MR and mild TR.   Catheterization done 06/10/15 showed:  Prox RCA to Mid RCA lesion, 100% stenosed. The lesion was previously treated with a stent (unknown type) greater than two years ago.  Dist RCA lesion, 70% stenosed.  Mid Cx lesion, 30% stenosed.  2nd Mrg lesion, 60% stenosed.  Mid LAD to Dist LAD lesion, 20% stenosed. The lesion was previously treated with a stent (unknown type).  1st Diag lesion, 30% stenosed. The lesion was previously treated with a stent (unknown type).   1. Widely patent stents in the LAD/diagonal. Chronically occluded RCA stents with extensive left-to-right collaterals and bridging collaterals. 2. Mildly elevated left ventricular end-diastolic pressure. Severely reduced LV systolic function by echocardiogram.  Admitted 04/30/2019 due to significant lumbar stenosis at L2/3. He underwent an L2/3 laminectomy and diskectomy. Left AMA 3 days later. Was in the ED 04/27/2019 due to back pain and shortness of breath due to HF. Treated and released.   He presents today for a follow-up visit with a chief complaint of      Past Medical History:  Diagnosis Date  . CAD (coronary artery disease)    a. s/p MI with LAD and Diag stenting @ [name];  b. 06/2015 Cath: LM nl, LAD 50m/d ISR, D1 30 ISR, RI min irregs, LCX 6m, OM1 min irregs, OM2 60, OM3 min irregs, RCA 100p/m ISR, 70d, L->R collats, EDP 18mmHg-->Med Rx.  . Chronic combined systolic and diastolic CHF (congestive heart failure) ([place])    a. 06/2015 Echo: EF 20-25%, Gr3 DD.  [name] Essential hypertension   . Hyperlipidemia   . Hypokalemia     a. 06/2015 in setting of diuresis.  . Ischemic cardiomyopathy    a. 2011 EF 45% ([name]);  b. 06/2015 Echo: EF 20-25%, sev diff HK w/ inf/inflat AK, Gr 3 DD, mod MR, sev dil LA, mod dil RA, mild TR.   Past Surgical History:  Procedure Laterality Date  . CARDIAC CATHETERIZATION N/A 06/10/2015   Procedure: Left Heart Cath;  Surgeon: Wellington Hampshire, MD;  Location: [place] [facility];  Service: Cardiovascular;  Laterality: N/A;  . CORONARY STENT PLACEMENT     Family History  Problem Relation Age of Onset  . Coronary artery disease Mother   . Diabetes Mother   . Coronary artery disease Father    Social History   Tobacco Use  . Smoking status: Former Smoker    Quit date: 06/10/2011    Years since quitting: 7.9  . Smokeless tobacco: Never Used  Substance Use Topics  . Alcohol use: No    Alcohol/week: 0.0 standard drinks   Allergies  Allergen Reactions  . Contrast Media [Iodinated Diagnostic Agents] Shortness Of Breath  . Hydrocodone Itching  . Iohexol      Onset Date: 78295621   . Morphine And Related   . Penicillins       Review of Systems  Constitutional: Positive for fatigue (easily). Negative for appetite change.  HENT: Positive for congestion and hearing loss. Negative for postnasal drip and sneezing.   Eyes: Negative.  Respiratory: Positive for shortness of breath (easily). Negative for cough.   Cardiovascular: Positive for palpitations. Negative for chest pain and leg swelling.  Gastrointestinal: Negative for abdominal distention and abdominal pain.  Endocrine: Negative.   Genitourinary: Negative.   Musculoskeletal: Positive for back pain. Negative for neck pain.  Skin: Negative.   Allergic/Immunologic: Negative.   Neurological: Positive for light-headedness. Negative for dizziness.  Hematological: Negative for adenopathy. Does not bruise/bleed easily.  Psychiatric/Behavioral: Negative for dysphoric mood and sleep disturbance (sleeping on 1 pillow). The  patient is not nervous/anxious.       Physical Exam Vitals signs and nursing note reviewed.  Constitutional:      Appearance: He is well-developed.  HENT:     Head: Normocephalic and atraumatic.     Right Ear: Decreased hearing noted.     Left Ear: Decreased hearing noted.  Neck:     Musculoskeletal: Normal range of motion and neck supple.     Vascular: No JVD.  Cardiovascular:     Rate and Rhythm: Normal rate and regular rhythm.  Pulmonary:     Effort: Pulmonary effort is normal. No respiratory distress.     Breath sounds: No wheezing or rales.  Abdominal:     Palpations: Abdomen is soft.     Tenderness: There is no abdominal tenderness.  Musculoskeletal:     Right lower leg: He exhibits no tenderness. No edema.     Left lower leg: He exhibits no tenderness. No edema.  Skin:    General: Skin is warm and dry.  Neurological:     General: No focal deficit present.     Mental Status: He is alert and oriented to person, place, and time.  Psychiatric:        Mood and Affect: Mood normal.        Behavior: Behavior normal.     Assessment & Plan:  1: Chronic heart failure with reduced ejection fraction- - NYHA class III - euvolemic today - weighing daily and reminded to call for an overnight weight gain of >2 pounds or a weekly weight gain of >5 pounds - weight 199 pounds from last visit here 1 month ago - not adding salt to his food but does eat out "often" as he lives alone; eats at [employer] or [employer] often and is aware that foods with sauces have more sodium. Says that [employer] doesn't add additional salt to his food -  - plan to uptitrate entresto as able along with adding carvedilol and/or spironolactine/ or farxiga - in 2016, he saw [name] cardiology while admitted, will probably need follow-up with them since he's had a previous stent placed - pro-BNP done 05/02/2019 was 949.0  2: HTN- - BP  - hasn't seen PCP Arlana Pouch) in years; instructed him to call his  office and get an appointment scheduled.  - BMP 05/03/2019 reviewed and showed sodium 136, potassium 4.7 creatinine 0.95 and GFR 85  Medication bottles were reviewed.

## 2019-05-28 ENCOUNTER — Ambulatory Visit: Payer: Self-pay | Admitting: Family

## 2019-06-13 NOTE — Progress Notes (Signed)
Patient ID: Glen Cabal., male    DOB: [birth year], 63 y.o.   MRN: [id number]  HPI  [name] is a 63 y/o male with a history of CAD, HTN, hyperlipidemia, hypokalemia, previous tobacco use and chronic heart failure.   Echo report from 05/04/2019 reviewed and showed an EF of 40-45% along with mild Glen. Echo report from 06/07/15 reviewed and showed an EF of 20-25% along with moderate Glen and mild TR.   Catheterization done 06/10/15 showed:  Prox RCA to Mid RCA lesion, 100% stenosed. The lesion was previously treated with a stent (unknown type) greater than two years ago.  Dist RCA lesion, 70% stenosed.  Mid Cx lesion, 30% stenosed.  2nd Mrg lesion, 60% stenosed.  Mid LAD to Dist LAD lesion, 20% stenosed. The lesion was previously treated with a stent (unknown type).  1st Diag lesion, 30% stenosed. The lesion was previously treated with a stent (unknown type).   1. Widely patent stents in the LAD/diagonal. Chronically occluded RCA stents with extensive left-to-right collaterals and bridging collaterals. 2. Mildly elevated left ventricular end-diastolic pressure. Severely reduced LV systolic function by echocardiogram.  Admitted 04/30/2019 due to significant lumbar stenosis at L2/3. He underwent an L2/3 laminectomy and diskectomy. Left AMA 3 days later. Was in the ED 04/27/2019 due to back pain and shortness of breath due to HF. Treated and released.   He presents today for a follow-up visit with a chief complaint of moderate shortness of breath upon minimal exertion. He describes this as chronic in nature having been present for several years. He has associated head congestion, intermittent chest pain, light-headedness, back pain and gradual weight gain along with this. He denies any difficulty sleeping, abdominal distention, palpitations, pedal edema or fatigue.   He has only been taking his entresto once daily because he says that the pharmacy told him that it would cost him ~ $600 and he  was trying to make it last. Is trying to "get insurance straightened out".    Past Medical History:  Diagnosis Date  . CAD (coronary artery disease)    a. s/p MI with LAD and Diag stenting @ [name];  b. 06/2015 Cath: LM nl, LAD 24m/d ISR, D1 30 ISR, RI min irregs, LCX 65m, OM1 min irregs, OM2 60, OM3 min irregs, RCA 100p/m ISR, 70d, L->R collats, EDP 25mmHg-->Med Rx.  . Chronic combined systolic and diastolic CHF (congestive heart failure) ([place])    a. 06/2015 Echo: EF 20-25%, Gr3 DD.  [name] Essential hypertension   . Hyperlipidemia   . Hypokalemia    a. 06/2015 in setting of diuresis.  . Ischemic cardiomyopathy    a. 2011 EF 45% ([name]);  b. 06/2015 Echo: EF 20-25%, sev diff HK w/ inf/inflat AK, Gr 3 DD, mod Glen, sev dil LA, mod dil RA, mild TR.   Past Surgical History:  Procedure Laterality Date  . CARDIAC CATHETERIZATION N/A 06/10/2015   Procedure: Left Heart Cath;  Surgeon: Wellington Hampshire, MD;  Location: [facility];  Service: Cardiovascular;  Laterality: N/A;  . CORONARY STENT PLACEMENT     Family History  Problem Relation Age of Onset  . Coronary artery disease Mother   . Diabetes Mother   . Coronary artery disease Father    Social History   Tobacco Use  . Smoking status: Former Smoker    Quit date: 06/10/2011    Years since quitting: 8.0  . Smokeless tobacco: Never Used  Substance Use Topics  . Alcohol  use: No    Alcohol/week: 0.0 standard drinks   Allergies  Allergen Reactions  . Contrast Media [Iodinated Diagnostic Agents] Shortness Of Breath  . Hydrocodone Itching  . Iohexol      Onset Date: 67672094   . Morphine And Related   . Penicillins    Prior to Admission medications   Medication Sig Start Date End Date Taking? Authorizing Provider  acetaminophen (TYLENOL) 500 MG tablet Take 1,000 mg by mouth at bedtime.   Yes [provider]  aspirin 81 MG chewable tablet Chew 1 tablet (81 mg total) by mouth daily. 06/11/15  Yes Auburn Bilberry, MD   furosemide (LASIX) 20 MG tablet Take 1 tablet (20 mg total) by mouth daily. 04/27/19 04/26/20 Yes Joni Reining, PA-C  gabapentin (NEURONTIN) 300 MG capsule Take 300 mg by mouth 3 (three) times daily.   Yes [provider]  sacubitril-valsartan (ENTRESTO) 24-26 MG Take 1 tablet by mouth 1 time daily. 04/30/19  Yes Delma Freeze, FNP     Review of Systems  Constitutional: Negative for appetite change and fatigue.  HENT: Positive for congestion and hearing loss. Negative for postnasal drip and sneezing.   Eyes: Negative.   Respiratory: Positive for shortness of breath (easily). Negative for cough.   Cardiovascular: Positive for chest pain (at times). Negative for palpitations and leg swelling.  Gastrointestinal: Negative for abdominal distention and abdominal pain.  Endocrine: Negative.   Genitourinary: Negative.   Musculoskeletal: Positive for back pain (improving). Negative for neck pain.  Skin: Negative.   Allergic/Immunologic: Negative.   Neurological: Positive for light-headedness. Negative for dizziness.  Hematological: Negative for adenopathy. Does not bruise/bleed easily.  Psychiatric/Behavioral: Negative for dysphoric mood and sleep disturbance (sleeping on 1 pillow). The patient is not nervous/anxious.    Vitals:   06/14/19 0956  BP: (!) 165/89  Pulse: 69  Resp: 20  SpO2: 100%  Weight: 218 lb 3.2 oz (99 kg)  Height: 5\' 6"  (1.676 m)   Wt Readings from Last 3 Encounters:  06/14/19 218 lb 3.2 oz (99 kg)  04/30/19 199 lb (90.3 kg)  04/27/19 260 lb (117.9 kg)   Lab Results  Component Value Date   CREATININE 0.92 06/10/2015   CREATININE 1.06 06/08/2015   CREATININE 1.21 06/07/2015   Physical Exam Vitals signs and nursing note reviewed.  Constitutional:      Appearance: He is well-developed.  HENT:     Head: Normocephalic and atraumatic.     Right Ear: Decreased hearing noted.     Left Ear: Decreased hearing noted.  Neck:     Musculoskeletal: Normal  range of motion and neck supple.     Vascular: No JVD.  Cardiovascular:     Rate and Rhythm: Normal rate and regular rhythm.  Pulmonary:     Effort: Pulmonary effort is normal. No respiratory distress.     Breath sounds: No wheezing or rales.  Abdominal:     Palpations: Abdomen is soft.     Tenderness: There is no abdominal tenderness.  Musculoskeletal:     Right lower leg: He exhibits no tenderness. No edema.     Left lower leg: He exhibits no tenderness. No edema.  Skin:    General: Skin is warm and dry.  Neurological:     General: No focal deficit present.     Mental Status: He is alert and oriented to person, place, and time.  Psychiatric:        Mood and Affect: Mood normal.  Behavior: Behavior normal.    Assessment & Plan:  1: Chronic heart failure with reduced ejection fraction- - NYHA class III - euvolemic today - weighing daily and reminded to call for an overnight weight gain of >2 pounds or a weekly weight gain of >5 pounds - weight up 19 pounds from last visit here 6 weeks ago - says that he's not been as active since he had his back surgery - not adding salt to his food but does eat out "often" as he lives alone; eats at [employer] or [employer] often and is aware that foods with sauces have more sodium. Says that last night he did eat regular ham but says that he didn't eat "much" because it tasted salty to him - entresto added at his last visit although he's only been taking it daily due to cost so will not get BMP today - he signed paperwork for [employer] to see if he qualifies to get assistance for entresto; will let him continue it daily for now and if he doesn't get approved, will need to switch him to an ACE-i - in 2016, he saw [facility] cardiology while admitted, will probably need follow-up with them since he's had a previous stent placed - pro-BNP done 05/02/2019 [facility]) was 949.0 - got his flu vaccine while recently admitted for his back surgery  2: HTN- -  BP mildly elevated today; [place] - hasn't seen PCP Arlana Pouch) in years; instructed him to call his office and get an appointment scheduled.  - BMP 05/03/2019 [facility]) reviewed and showed sodium 136, potassium 4.7 creatinine 0.95 and GFR 85  3: Lumbar stenosis- - had an L2/3 laminectomy and diskectomy since he was here last - says that his back is feeling better and he experiences pain at the incision site but, otherwise, it's much better  Patient did not bring his medications nor a list. Each medication was verbally reviewed with the patient and he was encouraged to bring the bottles to every visit to confirm accuracy of list.   Return in 1 month or sooner for any questions/problems before then.

## 2019-06-14 ENCOUNTER — Encounter: Payer: Self-pay | Admitting: Family

## 2019-06-14 ENCOUNTER — Ambulatory Visit: Payer: Self-pay | Attending: Family | Admitting: Family

## 2019-06-14 ENCOUNTER — Other Ambulatory Visit: Payer: Self-pay

## 2019-06-14 VITALS — BP 165/89 | HR 69 | Resp 20 | Ht 66.0 in | Wt 218.2 lb

## 2019-06-14 DIAGNOSIS — I10 Essential (primary) hypertension: Secondary | ICD-10-CM

## 2019-06-14 DIAGNOSIS — I252 Old myocardial infarction: Secondary | ICD-10-CM | POA: Insufficient documentation

## 2019-06-14 DIAGNOSIS — I255 Ischemic cardiomyopathy: Secondary | ICD-10-CM | POA: Insufficient documentation

## 2019-06-14 DIAGNOSIS — I5042 Chronic combined systolic (congestive) and diastolic (congestive) heart failure: Secondary | ICD-10-CM | POA: Insufficient documentation

## 2019-06-14 DIAGNOSIS — M549 Dorsalgia, unspecified: Secondary | ICD-10-CM | POA: Insufficient documentation

## 2019-06-14 DIAGNOSIS — M48061 Spinal stenosis, lumbar region without neurogenic claudication: Secondary | ICD-10-CM | POA: Insufficient documentation

## 2019-06-14 DIAGNOSIS — I5022 Chronic systolic (congestive) heart failure: Secondary | ICD-10-CM

## 2019-06-14 DIAGNOSIS — Z87891 Personal history of nicotine dependence: Secondary | ICD-10-CM | POA: Insufficient documentation

## 2019-06-14 DIAGNOSIS — R0789 Other chest pain: Secondary | ICD-10-CM | POA: Insufficient documentation

## 2019-06-14 DIAGNOSIS — Z79899 Other long term (current) drug therapy: Secondary | ICD-10-CM | POA: Insufficient documentation

## 2019-06-14 DIAGNOSIS — R0602 Shortness of breath: Secondary | ICD-10-CM | POA: Insufficient documentation

## 2019-06-14 DIAGNOSIS — Z7982 Long term (current) use of aspirin: Secondary | ICD-10-CM | POA: Insufficient documentation

## 2019-06-14 DIAGNOSIS — I2510 Atherosclerotic heart disease of native coronary artery without angina pectoris: Secondary | ICD-10-CM | POA: Insufficient documentation

## 2019-06-14 DIAGNOSIS — Z955 Presence of coronary angioplasty implant and graft: Secondary | ICD-10-CM | POA: Insufficient documentation

## 2019-06-14 DIAGNOSIS — Z8249 Family history of ischemic heart disease and other diseases of the circulatory system: Secondary | ICD-10-CM | POA: Insufficient documentation

## 2019-06-14 DIAGNOSIS — R42 Dizziness and giddiness: Secondary | ICD-10-CM | POA: Insufficient documentation

## 2019-06-14 DIAGNOSIS — I110 Hypertensive heart disease with heart failure: Secondary | ICD-10-CM | POA: Insufficient documentation

## 2019-06-14 DIAGNOSIS — E785 Hyperlipidemia, unspecified: Secondary | ICD-10-CM | POA: Insufficient documentation

## 2019-06-14 NOTE — Patient Instructions (Signed)
Continue weighing daily and call for an overnight weight gain of > 2 pounds or a weekly weight gain of >5 pounds. 

## 2019-07-14 NOTE — Progress Notes (Signed)
Patient ID: Glen Flemings., male    DOB: Dec 01, 1955, 64 y.o.   MRN: [id number]  HPI  [name] is a 64 y/o male with a history of CAD, HTN, hyperlipidemia, hypokalemia, previous tobacco use and chronic heart failure.   Echo report from 05/10/2019 done but unable to view report. Echo report from 05/04/2019 reviewed and showed an EF of 40-45% along with mild Glen. Echo report from 06/07/15 reviewed and showed an EF of 20-25% along with moderate Glen and mild TR.   Catheterization done 06/10/15 showed:  Prox RCA to Mid RCA lesion, 100% stenosed. The lesion was previously treated with a stent (unknown type) greater than two years ago.  Dist RCA lesion, 70% stenosed.  Mid Cx lesion, 30% stenosed.  2nd Mrg lesion, 60% stenosed.  Mid LAD to Dist LAD lesion, 20% stenosed. The lesion was previously treated with a stent (unknown type).  1st Diag lesion, 30% stenosed. The lesion was previously treated with a stent (unknown type).   1. Widely patent stents in the LAD/diagonal. Chronically occluded RCA stents with extensive left-to-right collaterals and bridging collaterals. 2. Mildly elevated left ventricular end-diastolic pressure. Severely reduced LV systolic function by echocardiogram.  Admitted 04/30/2019 due to significant lumbar stenosis at L2/3. He underwent an L2/3 laminectomy and diskectomy. Left AMA 3 days later. Was in the ED 04/27/2019 due to back pain and shortness of breath due to HF. Treated and released.   He presents today for a follow-up visit with a chief complaint of moderate shortness of breath upon minimal exertion. He describes this as chronic in nature having been present for several years. He does feel like it's worsened recently but admits that he hasn't taken his diuretic the last couple of weeks "just because".  He has associated head congestion, intermittent chest pain, continued back pain and gradual weigh gain along with this. He denies any difficulty sleeping, dizziness,  abdominal distention, palpitations, pedal edema, cough or fatigue.    Past Medical History:  Diagnosis Date  . CAD (coronary artery disease)    a. s/p MI with LAD and Diag stenting @ [name];  b. 06/2015 Cath: LM nl, LAD 76m/d ISR, D1 30 ISR, RI min irregs, LCX 51m, OM1 min irregs, OM2 60, OM3 min irregs, RCA 100p/m ISR, 70d, L->R collats, EDP 62mmHg-->Med Rx.  . Chronic combined systolic and diastolic CHF (congestive heart failure) (HCC)    a. 06/2015 Echo: EF 20-25%, Gr3 DD.  [name] Essential hypertension   . Hyperlipidemia   . Hypokalemia    a. 06/2015 in setting of diuresis.  . Ischemic cardiomyopathy    a. 2011 EF 45% ([name]);  b. 06/2015 Echo: EF 20-25%, sev diff HK w/ inf/inflat AK, Gr 3 DD, mod Glen, sev dil LA, mod dil RA, mild TR.   Past Surgical History:  Procedure Laterality Date  . CARDIAC CATHETERIZATION N/A 06/10/2015   Procedure: Left Heart Cath;  Surgeon: Iran Ouch, MD;  Location: [facility] INVASIVE CV LAB;  Service: Cardiovascular;  Laterality: N/A;  . CORONARY STENT PLACEMENT     Family History  Problem Relation Age of Onset  . Coronary artery disease Mother   . Diabetes Mother   . Coronary artery disease Father    Social History   Tobacco Use  . Smoking status: Former Smoker    Quit date: 06/10/2011    Years since quitting: 8.0  . Smokeless tobacco: Never Used  Substance Use Topics  . Alcohol use: No    Alcohol/week:  0.0 standard drinks   Allergies  Allergen Reactions  . Contrast Media [Iodinated Diagnostic Agents] Shortness Of Breath  . Hydrocodone Itching  . Iohexol      Onset Date: 24268341   . Morphine And Related   . Penicillins    Prior to Admission medications   Medication Sig Start Date End Date Taking? Authorizing Provider  acetaminophen (TYLENOL) 500 MG tablet Take 1,000 mg by mouth at bedtime.   Yes [provider]  gabapentin (NEURONTIN) 300 MG capsule Take 300 mg by mouth 3 (three) times daily.   Yes [provider]   sacubitril-valsartan (ENTRESTO) 24-26 MG Take 1 tablet by mouth 2 (two) times daily. 04/30/19  Yes [name] A, FNP  aspirin 81 MG chewable tablet Chew 1 tablet (81 mg total) by mouth daily. Patient not taking: Reported on 07/16/2019 06/11/15   Dustin Flock, MD  furosemide (LASIX) 20 MG tablet Take 1 tablet (20 mg total) by mouth daily. Patient not taking: Reported on 07/16/2019 04/27/19 04/26/20  Sable Feil, PA-C     Review of Systems  Constitutional: Negative for appetite change and fatigue.  HENT: Positive for congestion and hearing loss. Negative for postnasal drip and sneezing.   Eyes: Negative.   Respiratory: Positive for shortness of breath (easily). Negative for cough.   Cardiovascular: Positive for chest pain (at times). Negative for palpitations and leg swelling.  Gastrointestinal: Negative for abdominal distention and abdominal pain.  Endocrine: Negative.   Genitourinary: Negative.   Musculoskeletal: Positive for back pain. Negative for neck pain.  Skin: Negative.   Allergic/Immunologic: Negative.   Neurological: Negative for dizziness and light-headedness.  Hematological: Negative for adenopathy. Does not bruise/bleed easily.  Psychiatric/Behavioral: Negative for dysphoric mood and sleep disturbance (sleeping on 1 pillow). The patient is not nervous/anxious.    Vitals:   07/16/19 0944  BP: (!) 166/77  Pulse: 69  Resp: 18  SpO2: 99%  Weight: 218 lb (98.9 kg)  Height: 5\' 6"  (1.676 m)   Wt Readings from Last 3 Encounters:  07/16/19 218 lb (98.9 kg)  06/14/19 218 lb 3.2 oz (99 kg)  04/30/19 199 lb (90.3 kg)   Lab Results  Component Value Date   CREATININE 0.92 06/10/2015   CREATININE 1.06 06/08/2015   CREATININE 1.21 06/07/2015    Physical Exam Vitals and nursing note reviewed.  Constitutional:      Appearance: He is well-developed.  HENT:     Head: Normocephalic and atraumatic.     Right Ear: Decreased hearing noted.     Left Ear: Decreased  hearing noted.  Neck:     Vascular: No JVD.  Cardiovascular:     Rate and Rhythm: Normal rate and regular rhythm.  Pulmonary:     Effort: Pulmonary effort is normal. No respiratory distress.     Breath sounds: No wheezing or rales.  Abdominal:     Palpations: Abdomen is soft.     Tenderness: There is no abdominal tenderness.  Musculoskeletal:     Cervical back: Normal range of motion and neck supple.     Right lower leg: No tenderness. No edema.     Left lower leg: No tenderness. No edema.  Skin:    General: Skin is warm and dry.  Neurological:     General: No focal deficit present.     Mental Status: He is alert and oriented to person, place, and time.  Psychiatric:        Mood and Affect: Mood normal.  Behavior: Behavior normal.    Assessment & Plan:  1: Chronic heart failure with reduced ejection fraction- - NYHA class III - euvolemic today - weighing daily and reminded to call for an overnight weight gain of >2 pounds or a weekly weight gain of >5 pounds - weight up 19 pounds from last visit here 2 1/2 months ago - says that he's not been as active since he had his back surgery - not adding salt to his food but does eat out "often" as he lives alone; eats at [employer] or [employer] often and is aware that foods with sauces have more sodium.  - instructed him to resume his furosemide 20mg  once daily - will check BMP at his next visit - taking entresto 24/26mg  BID; consider titrating or add beta-blocker at next visit - in 2016, he saw [facility] cardiology while admitted, will probably need follow-up with them since he's had a previous stent placed - pro-BNP done 05/02/2019 [facility]) was 949.0 - got his flu vaccine for this season  2: HTN- - BP elevated but he hasn't taken his furosemide the last couple of weeks; resuming today - hasn't seen PCP [facility]) in years; instructed him to call his office and get an appointment scheduled.  - BMP 05/03/2019 [facility]) reviewed and showed  sodium 136, potassium 4.7 creatinine 0.95 and GFR 85  3: Lumbar stenosis- - has had an L2/3 laminectomy and diskectomy  - saw neurosurgery 05/14/2019 & instructed him to make a f/u appointment  Patient did not bring his medications nor a list. Each medication was verbally reviewed with the patient and he was encouraged to bring the bottles to every visit to confirm accuracy of list.   Return in 1 month or sooner for any questions/problems before then.

## 2019-07-16 ENCOUNTER — Other Ambulatory Visit: Payer: Self-pay

## 2019-07-16 ENCOUNTER — Ambulatory Visit: Payer: Self-pay | Attending: Family | Admitting: Family

## 2019-07-16 ENCOUNTER — Encounter: Payer: Self-pay | Admitting: Family

## 2019-07-16 VITALS — BP 166/77 | HR 69 | Resp 18 | Ht 66.0 in | Wt 218.0 lb

## 2019-07-16 DIAGNOSIS — Z955 Presence of coronary angioplasty implant and graft: Secondary | ICD-10-CM | POA: Insufficient documentation

## 2019-07-16 DIAGNOSIS — I5042 Chronic combined systolic (congestive) and diastolic (congestive) heart failure: Secondary | ICD-10-CM | POA: Insufficient documentation

## 2019-07-16 DIAGNOSIS — M549 Dorsalgia, unspecified: Secondary | ICD-10-CM | POA: Insufficient documentation

## 2019-07-16 DIAGNOSIS — E785 Hyperlipidemia, unspecified: Secondary | ICD-10-CM | POA: Insufficient documentation

## 2019-07-16 DIAGNOSIS — R079 Chest pain, unspecified: Secondary | ICD-10-CM | POA: Insufficient documentation

## 2019-07-16 DIAGNOSIS — Z79899 Other long term (current) drug therapy: Secondary | ICD-10-CM | POA: Insufficient documentation

## 2019-07-16 DIAGNOSIS — I252 Old myocardial infarction: Secondary | ICD-10-CM | POA: Insufficient documentation

## 2019-07-16 DIAGNOSIS — I10 Essential (primary) hypertension: Secondary | ICD-10-CM

## 2019-07-16 DIAGNOSIS — H9190 Unspecified hearing loss, unspecified ear: Secondary | ICD-10-CM | POA: Insufficient documentation

## 2019-07-16 DIAGNOSIS — E876 Hypokalemia: Secondary | ICD-10-CM | POA: Insufficient documentation

## 2019-07-16 DIAGNOSIS — M48061 Spinal stenosis, lumbar region without neurogenic claudication: Secondary | ICD-10-CM | POA: Insufficient documentation

## 2019-07-16 DIAGNOSIS — I2510 Atherosclerotic heart disease of native coronary artery without angina pectoris: Secondary | ICD-10-CM | POA: Insufficient documentation

## 2019-07-16 DIAGNOSIS — I255 Ischemic cardiomyopathy: Secondary | ICD-10-CM | POA: Insufficient documentation

## 2019-07-16 DIAGNOSIS — Z8249 Family history of ischemic heart disease and other diseases of the circulatory system: Secondary | ICD-10-CM | POA: Insufficient documentation

## 2019-07-16 DIAGNOSIS — R0602 Shortness of breath: Secondary | ICD-10-CM | POA: Insufficient documentation

## 2019-07-16 DIAGNOSIS — I5022 Chronic systolic (congestive) heart failure: Secondary | ICD-10-CM

## 2019-07-16 DIAGNOSIS — Z87891 Personal history of nicotine dependence: Secondary | ICD-10-CM | POA: Insufficient documentation

## 2019-07-16 DIAGNOSIS — R0981 Nasal congestion: Secondary | ICD-10-CM | POA: Insufficient documentation

## 2019-07-16 DIAGNOSIS — I110 Hypertensive heart disease with heart failure: Secondary | ICD-10-CM | POA: Insufficient documentation

## 2019-07-16 NOTE — Patient Instructions (Addendum)
Continue weighing daily and call for an overnight weight gain of > 2 pounds or a weekly weight gain of >5 pounds.   Call your back surgeon and Dr. Arlana Pouch to get appointments scheduled.   Resume fluid pill daily.

## 2019-08-15 NOTE — Progress Notes (Signed)
Patient ID: [name]., male    DOB: 09-08-1955, 64 y.o.   MRN: [id number]  HPI  [name] is a 64 y/o male with a history of CAD, HTN, hyperlipidemia, hypokalemia, previous tobacco use and chronic heart failure.   Echo report from 05/04/2019 reviewed and showed an EF of 40-45% along with mild Glen. Echo report from 06/07/15 reviewed and showed an EF of 20-25% along with moderate Glen and mild TR.   Catheterization done 06/10/15 showed:  Prox RCA to Mid RCA lesion, 100% stenosed. The lesion was previously treated with a stent (unknown type) greater than two years ago.  Dist RCA lesion, 70% stenosed.  Mid Cx lesion, 30% stenosed.  2nd Mrg lesion, 60% stenosed.  Mid LAD to Dist LAD lesion, 20% stenosed. The lesion was previously treated with a stent (unknown type).  1st Diag lesion, 30% stenosed. The lesion was previously treated with a stent (unknown type).   1. Widely patent stents in the LAD/diagonal. Chronically occluded RCA stents with extensive left-to-right collaterals and bridging collaterals. 2. Mildly elevated left ventricular end-diastolic pressure. Severely reduced LV systolic function by echocardiogram.  Admitted 04/30/2019 due to significant lumbar stenosis at L2/3. He underwent an L2/3 laminectomy and diskectomy. Left AMA 3 days later. Was in the ED 04/27/2019 due to back pain and shortness of breath due to HF. Treated and released.   He presents today for a follow-up visit with a chief complaint of moderate shortness of breath with little exertion. He describes this as chronic in nature having been present for several years although occasionally also gets short of breath. He has associated chest pain, difficulty sleeping and chronic back pain along with this. He denies any dizziness, abdominal distention, palpitations, pedal edema or cough.   Says that ~ 3 weeks ago, he had sudden severe chest pain along with left arm pain. Said that he took Tylenol and eventually the pain  subsided. Has chronic intermittent chest pain but he says that this felt like his "first heart attack".   Past Medical History:  Diagnosis Date  . CAD (coronary artery disease)    a. s/p MI with LAD and Diag stenting @ [name];  b. 06/2015 Cath: LM nl, LAD 15m/d ISR, D1 30 ISR, RI min irregs, LCX 64m, OM1 min irregs, OM2 60, OM3 min irregs, RCA 100p/m ISR, 70d, L->R collats, EDP 48mmHg-->Med Rx.  . Chronic combined systolic and diastolic CHF (congestive heart failure) (HCC)    a. 06/2015 Echo: EF 20-25%, Gr3 DD.  [name] Essential hypertension   . Hyperlipidemia   . Hypokalemia    a. 06/2015 in setting of diuresis.  . Ischemic cardiomyopathy    a. 2011 EF 45% ([name]);  b. 06/2015 Echo: EF 20-25%, sev diff HK w/ inf/inflat AK, Gr 3 DD, mod Glen, sev dil LA, mod dil RA, mild TR.   Past Surgical History:  Procedure Laterality Date  . CARDIAC CATHETERIZATION N/A 06/10/2015   Procedure: Left Heart Cath;  Surgeon: Iran Ouch, MD;  Location: [facility] INVASIVE CV LAB;  Service: Cardiovascular;  Laterality: N/A;  . CORONARY STENT PLACEMENT     Family History  Problem Relation Age of Onset  . Coronary artery disease Mother   . Diabetes Mother   . Coronary artery disease Father    Social History   Tobacco Use  . Smoking status: Former Smoker    Quit date: 06/10/2011    Years since quitting: 8.1  . Smokeless tobacco: Never Used  Substance  Use Topics  . Alcohol use: No    Alcohol/week: 0.0 standard drinks   Allergies  Allergen Reactions  . Contrast Media [Iodinated Diagnostic Agents] Shortness Of Breath  . Hydrocodone Itching  . Iohexol      Onset Date: [id number]   . Morphine And Related   . Penicillins     Prior to Admission medications   Medication Sig Start Date End Date Taking? Authorizing Provider  acetaminophen (TYLENOL) 500 MG tablet Take 1,000 mg by mouth at bedtime.   Yes [provider]  aspirin 81 MG chewable tablet Chew 1 tablet (81 mg total) by mouth daily. 06/11/15   Yes Dustin Flock, MD  furosemide (LASIX) 20 MG tablet Take 1 tablet (20 mg total) by mouth daily. 04/27/19 04/26/20 Yes Sable Feil, PA-C  gabapentin (NEURONTIN) 300 MG capsule Take 300 mg by mouth 3 (three) times daily.   Yes [provider]  sacubitril-valsartan (ENTRESTO) 24-26 MG Take 1 tablet by mouth 2 (two) times daily. 04/30/19  Yes Alisa Graff, FNP    Review of Systems  Constitutional: Negative for appetite change and fatigue.  HENT: Positive for congestion and hearing loss. Negative for postnasal drip and sneezing.   Eyes: Negative.   Respiratory: Positive for shortness of breath (easily). Negative for cough.   Cardiovascular: Positive for chest pain (at times). Negative for palpitations and leg swelling.  Gastrointestinal: Negative for abdominal distention and abdominal pain.  Endocrine: Negative.   Genitourinary: Negative.   Musculoskeletal: Positive for back pain. Negative for neck pain.  Skin: Negative.   Allergic/Immunologic: Negative.   Neurological: Negative for dizziness and light-headedness.  Hematological: Negative for adenopathy. Does not bruise/bleed easily.  Psychiatric/Behavioral: Positive for sleep disturbance (sleeping on 1 pillow although not sleeping well.). Negative for dysphoric mood. The patient is not nervous/anxious.    Vitals:   08/16/19 0943  BP: (!) 166/83  Pulse: 73  Resp: 20  SpO2: 96%  Weight: 216 lb (98 kg)  Height: 5\' 6"  (1.676 m)   Wt Readings from Last 3 Encounters:  08/16/19 216 lb (98 kg)  07/16/19 218 lb (98.9 kg)  06/14/19 218 lb 3.2 oz (99 kg)   Lab Results  Component Value Date   CREATININE 0.92 06/10/2015   CREATININE 1.06 06/08/2015   CREATININE 1.21 06/07/2015    Physical Exam Vitals and nursing note reviewed.  Constitutional:      Appearance: He is well-developed.  HENT:     Head: Normocephalic and atraumatic.     Right Ear: Decreased hearing noted.     Left Ear: Decreased hearing noted.  Neck:      Vascular: No JVD.  Cardiovascular:     Rate and Rhythm: Normal rate and regular rhythm.  Pulmonary:     Effort: Pulmonary effort is normal. No respiratory distress.     Breath sounds: No wheezing or rales.  Abdominal:     Palpations: Abdomen is soft.     Tenderness: There is no abdominal tenderness.  Musculoskeletal:     Cervical back: Normal range of motion and neck supple.     Right lower leg: No tenderness. No edema.     Left lower leg: No tenderness. No edema.  Skin:    General: Skin is warm and dry.  Neurological:     General: No focal deficit present.     Mental Status: He is alert and oriented to person, place, and time.  Psychiatric:        Mood and Affect:  Mood normal.        Behavior: Behavior normal.    Assessment & Plan:  1: Chronic heart failure with reduced ejection fraction- - NYHA class III - euvolemic today - not weighing daily but does have scales; Instructed to weigh daily and call for an overnight weight gain of >2 pounds or a weekly weight gain of >5 pounds - weight down 2 pounds from last visit here 1 month ago - says that he's not been as active since he had his back surgery - not adding salt to his food but does eat out "often" as he lives alone; eats at [employer] or [employer] often and is aware that foods with sauces have more sodium.  - 2 weeks samples of entresto 24/26mg  given to patient today - will start metoprolol succinate 25mg  daily - in 2016, he saw [facility] cardiology while admitted; made appointment with them for today - pro-BNP done 05/02/2019 [facility]) was 949.0 - got his flu vaccine for this season  2: HTN- - BP mildly elevated today - hasn't seen PCP [facility]) in years; instructed him to call his office and get an appointment scheduled.  - BMP 05/03/2019 [facility]) reviewed and showed sodium 136, potassium 4.7 creatinine 0.95 and GFR 85  3: Lumbar stenosis- - has had an L2/3 laminectomy and diskectomy October 2020  4: Chest pain- [facility]  cardiology can see patient directly from our office today - patient sent to their office for further work-up  Patient did not bring his medications nor a list. Each medication was verbally reviewed with the patient and he was encouraged to bring the bottles to every visit to confirm accuracy of list.    Return in 1 month or sooner for any questions/problems before then.

## 2019-08-16 ENCOUNTER — Other Ambulatory Visit: Payer: Self-pay

## 2019-08-16 ENCOUNTER — Other Ambulatory Visit
Admission: RE | Admit: 2019-08-16 | Discharge: 2019-08-16 | Disposition: A | Discharge status: Home / Self Care | Payer: Self-pay | Source: Ambulatory Visit | Attending: Cardiovascular Disease | Admitting: Cardiovascular Disease

## 2019-08-16 ENCOUNTER — Ambulatory Visit (INDEPENDENT_AMBULATORY_CARE_PROVIDER_SITE_OTHER): Payer: Self-pay | Admitting: Cardiovascular Disease

## 2019-08-16 ENCOUNTER — Ambulatory Visit: Payer: Self-pay | Admitting: Family

## 2019-08-16 ENCOUNTER — Encounter: Payer: Self-pay | Admitting: Cardiovascular Disease

## 2019-08-16 ENCOUNTER — Encounter: Payer: Self-pay | Admitting: Family

## 2019-08-16 VITALS — BP 166/83 | HR 73 | Resp 20 | Ht 66.0 in | Wt 216.0 lb

## 2019-08-16 VITALS — BP 150/82 | HR 60 | Ht 66.0 in | Wt 215.2 lb

## 2019-08-16 DIAGNOSIS — I2510 Atherosclerotic heart disease of native coronary artery without angina pectoris: Secondary | ICD-10-CM | POA: Insufficient documentation

## 2019-08-16 DIAGNOSIS — I25118 Atherosclerotic heart disease of native coronary artery with other forms of angina pectoris: Secondary | ICD-10-CM

## 2019-08-16 DIAGNOSIS — I5042 Chronic combined systolic (congestive) and diastolic (congestive) heart failure: Secondary | ICD-10-CM

## 2019-08-16 DIAGNOSIS — I5022 Chronic systolic (congestive) heart failure: Secondary | ICD-10-CM

## 2019-08-16 DIAGNOSIS — I252 Old myocardial infarction: Secondary | ICD-10-CM | POA: Insufficient documentation

## 2019-08-16 DIAGNOSIS — M48061 Spinal stenosis, lumbar region without neurogenic claudication: Secondary | ICD-10-CM

## 2019-08-16 DIAGNOSIS — Z833 Family history of diabetes mellitus: Secondary | ICD-10-CM | POA: Insufficient documentation

## 2019-08-16 DIAGNOSIS — E785 Hyperlipidemia, unspecified: Secondary | ICD-10-CM

## 2019-08-16 DIAGNOSIS — Z87891 Personal history of nicotine dependence: Secondary | ICD-10-CM | POA: Insufficient documentation

## 2019-08-16 DIAGNOSIS — Z885 Allergy status to narcotic agent status: Secondary | ICD-10-CM | POA: Insufficient documentation

## 2019-08-16 DIAGNOSIS — Z79899 Other long term (current) drug therapy: Secondary | ICD-10-CM | POA: Insufficient documentation

## 2019-08-16 DIAGNOSIS — Z8249 Family history of ischemic heart disease and other diseases of the circulatory system: Secondary | ICD-10-CM | POA: Insufficient documentation

## 2019-08-16 DIAGNOSIS — Z955 Presence of coronary angioplasty implant and graft: Secondary | ICD-10-CM | POA: Insufficient documentation

## 2019-08-16 DIAGNOSIS — I110 Hypertensive heart disease with heart failure: Secondary | ICD-10-CM | POA: Insufficient documentation

## 2019-08-16 DIAGNOSIS — R0789 Other chest pain: Secondary | ICD-10-CM | POA: Insufficient documentation

## 2019-08-16 DIAGNOSIS — Z7982 Long term (current) use of aspirin: Secondary | ICD-10-CM | POA: Insufficient documentation

## 2019-08-16 DIAGNOSIS — I25110 Atherosclerotic heart disease of native coronary artery with unstable angina pectoris: Secondary | ICD-10-CM

## 2019-08-16 DIAGNOSIS — Z20822 Contact with and (suspected) exposure to covid-19: Secondary | ICD-10-CM | POA: Insufficient documentation

## 2019-08-16 DIAGNOSIS — I10 Essential (primary) hypertension: Secondary | ICD-10-CM

## 2019-08-16 DIAGNOSIS — I255 Ischemic cardiomyopathy: Secondary | ICD-10-CM | POA: Insufficient documentation

## 2019-08-16 DIAGNOSIS — Z01818 Encounter for other preprocedural examination: Secondary | ICD-10-CM | POA: Insufficient documentation

## 2019-08-16 DIAGNOSIS — Z880 Allergy status to penicillin: Secondary | ICD-10-CM | POA: Insufficient documentation

## 2019-08-16 DIAGNOSIS — Z91041 Radiographic dye allergy status: Secondary | ICD-10-CM | POA: Insufficient documentation

## 2019-08-16 DIAGNOSIS — I209 Angina pectoris, unspecified: Secondary | ICD-10-CM

## 2019-08-16 LAB — BASIC METABOLIC PANEL
Anion gap: 10 (ref 5–15)
BUN: 14 mg/dL (ref 8–23)
CO2: 22 mmol/L (ref 22–32)
Calcium: 9.3 mg/dL (ref 8.9–10.3)
Chloride: 107 mmol/L (ref 98–111)
Creatinine, Ser: 0.81 mg/dL (ref 0.61–1.24)
GFR calc Af Amer: >60 mL/min (ref >60)
GFR calc non Af Amer: >60 mL/min (ref >60)
Glucose, Bld: 164 mg/dL — ABNORMAL HIGH (ref 70–99)
Potassium: 3.6 mmol/L (ref 3.5–5.1)
Sodium: 139 mmol/L (ref 135–145)

## 2019-08-16 LAB — SARS CORONAVIRUS 2 (TAT 6-24 HRS): SARS Coronavirus 2: NEGATIVE

## 2019-08-16 LAB — CBC WITH DIFFERENTIAL/PLATELET
Abs Immature Granulocytes: 0.01 10*3/uL (ref 0.00–0.07)
Basophils Absolute: 0 10*3/uL (ref 0.0–0.1)
Basophils Relative: 0 %
Eosinophils Absolute: 0.2 10*3/uL (ref 0.0–0.5)
Eosinophils Relative: 4 %
HCT: 43.9 % (ref 39.0–52.0)
Hemoglobin: 15.6 g/dL (ref 13.0–17.0)
Immature Granulocytes: 0 %
Lymphocytes Relative: 46 %
Lymphs Abs: 2.1 10*3/uL (ref 0.7–4.0)
MCH: 28.9 pg (ref 26.0–34.0)
MCHC: 35.5 g/dL (ref 30.0–36.0)
MCV: 81.4 fL (ref 80.0–100.0)
Monocytes Absolute: 0.6 10*3/uL (ref 0.1–1.0)
Monocytes Relative: 14 %
Neutro Abs: 1.7 10*3/uL (ref 1.7–7.7)
Neutrophils Relative %: 36 %
Platelets: 173 10*3/uL (ref 150–400)
RBC: 5.39 MIL/uL (ref 4.22–5.81)
RDW: 14.6 % (ref 11.5–15.5)
WBC: 4.6 10*3/uL (ref 4.0–10.5)
nRBC: 0 % (ref 0.0–0.2)

## 2019-08-16 MED ORDER — FUROSEMIDE 20 MG PO TABS: 20.0000 mg | ORAL_TABLET | Freq: Every day | ORAL | 5 refills | Status: DC

## 2019-08-16 MED ORDER — ROSUVASTATIN CALCIUM 10 MG PO TABS: 10.0000 mg | ORAL_TABLET | Freq: Every day | ORAL | 3 refills | Status: DC

## 2019-08-16 MED ORDER — CARVEDILOL 3.125 MG PO TABS: 3.1250 mg | ORAL_TABLET | Freq: Two times a day (BID) | ORAL | 3 refills | Status: DC

## 2019-08-16 MED ORDER — PREDNISONE 50 MG PO TABS: ORAL_TABLET | ORAL | 0 refills | Status: DC

## 2019-08-16 MED ORDER — METOPROLOL SUCCINATE ER 25 MG PO TB24: 25.0000 mg | ORAL_TABLET | Freq: Every day | ORAL | 5 refills | Status: DC

## 2019-08-16 NOTE — Progress Notes (Signed)
Cardiology Office Note   Date:  08/16/2019   ID:  [name]., DOB 1956/04/08, MRN [id number]  PCP:  Albina Billet, MD  Cardiologist:   Kathlyn Sacramento, MD   Chief Complaint  Patient presents with  . OTHER    Ref by [name] for CHF with having chest pain and shortness of breath. Establish care for CHF; pt. saw Dr. Verl Blalock in the past. Meds reviewed by the pt. verbally.       History of Present Illness: [name]. is a 64 y.o. male who was referred by [name] for urgent evaluation of chest pain and shortness of breath. He has known history of coronary artery disease status post remote MI with previous LAD,diagonal and RCA stenting at [facility].  Cardiac catheterization in 2016 showed patent LAD and diagonal stents but with chronically occluded RCA and left to right collaterals.  He also has chronic systolic heart failure due to ischemic cardiomyopathy.  He has been followed at the heart failure clinic.  He reports hyperlipidemia with poor tolerance to statins as they make him feel bad. He had laminectomy and discectomy in October 2020.  He established with the heart failure clinic after that and was started on Entresto.  No recent EF evaluation. He is a previous smoker and quit about 7 years ago.  He has extensive family history of coronary artery disease. Over the last 2 weeks, he has experienced intermittent episodes of sharp chest discomfort lasting for 1 to 2 minutes both at rest and with exertion.  In addition, his exertional dyspnea has worsened significantly and he feels that he has no energy to do any activities.  He wakes up at night with back pain.    Past Medical History:  Diagnosis Date  . CAD (coronary artery disease)    a. s/p MI with LAD and Diag stenting @ [name];  b. 06/2015 Cath: LM nl, LAD 76m/d ISR, D1 30 ISR, RI min irregs, LCX 49m, OM1 min irregs, OM2 60, OM3 min irregs, RCA 100p/m ISR, 70d, L->R collats, EDP 47mmHg-->Med Rx.  . Chronic combined  systolic and diastolic CHF (congestive heart failure) ([place])    a. 06/2015 Echo: EF 20-25%, Gr3 DD.  [name] Essential hypertension   . Hyperlipidemia   . Hypokalemia    a. 06/2015 in setting of diuresis.  . Ischemic cardiomyopathy    a. 2011 EF 45% ([name]);  b. 06/2015 Echo: EF 20-25%, sev diff HK w/ inf/inflat AK, Gr 3 DD, mod MR, sev dil LA, mod dil RA, mild TR.    Past Surgical History:  Procedure Laterality Date  . BACK SURGERY  04/2019  . CARDIAC CATHETERIZATION N/A 06/10/2015   Procedure: Left Heart Cath;  Surgeon: Wellington Hampshire, MD;  Location: [place] CV LAB;  Service: Cardiovascular;  Laterality: N/A;  . CORONARY STENT PLACEMENT       Current Outpatient Medications  Medication Sig Dispense Refill  . acetaminophen (TYLENOL) 500 MG tablet Take 1,000 mg by mouth at bedtime.    [name] aspirin 81 MG chewable tablet Chew 1 tablet (81 mg total) by mouth daily. 30 tablet 0  . furosemide (LASIX) 20 MG tablet Take 1 tablet (20 mg total) by mouth daily. 30 tablet 5  . gabapentin (NEURONTIN) 300 MG capsule Take 300 mg by mouth 3 (three) times daily.    . metoprolol succinate (TOPROL XL) 25 MG 24 hr tablet Take 1 tablet (25 mg total) by mouth daily. [place]  tablet 5  . sacubitril-valsartan (ENTRESTO) 24-26 MG Take 1 tablet by mouth 2 (two) times daily. 60 tablet 3   No current facility-administered medications for this visit.    Allergies:   Contrast media [iodinated diagnostic agents], Hydrocodone, Iohexol, Morphine and related, and Penicillins    Social History:  The patient  reports that he quit smoking about 8 years ago. He has never used smokeless tobacco. He reports that he does not drink alcohol or use drugs.   Family History:  The patient's family history includes Coronary artery disease in his father and mother; Diabetes in his mother.    ROS:  Please see the history of present illness.   Otherwise, review of systems are positive for none.   All other systems are reviewed and  negative.    PHYSICAL EXAM: VS:  BP (!) 150/82 (BP Location: Right Arm, Patient Position: Sitting, Cuff Size: Large)   Pulse 60   Ht 5' 6" (1.676 m)   Wt 215 lb 4 oz (97.6 kg)   SpO2 97%   BMI 34.74 kg/m  , BMI Body mass index is 34.74 kg/m. GEN: Well nourished, well developed, in no acute distress  HEENT: normal  Neck: no JVD, carotid bruits, or masses Cardiac: RRR; no murmurs, rubs, or gallops,no edema  Respiratory:  clear to auscultation bilaterally, normal work of breathing GI: soft, nontender, nondistended, + BS MS: no deformity or atrophy  Skin: warm and dry, no rash Neuro:  Strength and sensation are intact Psych: euthymic mood, full affect   EKG:  EKG is ordered today. The ekg ordered today demonstrates normal sinus rhythm with possible left atrial enlargement.  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   Recent Labs: No results found for requested labs within last 8760 hours.    Lipid Panel    Component Value Date/Time   CHOL 119 06/07/2015 2334   TRIG 171 (H) 06/07/2015 2334   HDL 25 (L) 06/07/2015 2334   CHOLHDL 4.8 06/07/2015 2334   VLDL 34 06/07/2015 2334   LDLCALC 60 06/07/2015 2334      Wt Readings from Last 3 Encounters:  08/16/19 215 lb 4 oz (97.6 kg)  08/16/19 216 lb (98 kg)  07/16/19 218 lb (98.9 kg)       PAD Screen 08/16/2019  Previous PAD dx? No  Previous surgical procedure? No  Pain with walking? No  Feet/toe relief with dangling? No  Painful, non-healing ulcers? No  Extremities discolored? No      ASSESSMENT AND PLAN:  1.  Coronary artery disease involving native coronary arteries with unstable angina: The patient recent episodes of chest pain and significant worsening of exertional dyspnea is highly concerning for unstable angina.  He has known history of extensive coronary artery disease and ischemic cardiomyopathy.  Due to that, I recommend urgent evaluation with a right and left cardiac  catheterization and possible PCI.  I discussed the procedure in details as well as risks and benefits.  2.  Chronic systolic heart failure due to ischemic cardiomyopathy: He needs EF evaluation and that can be done with cardiac cath.  He appears to be euvolemic.  We will do a right heart catheterization at the same time to evaluate pulmonary pressures and cardiac output.  Continue treatment with Entresto. His heart rate is somewhat on the low side and I am hesitant about adding Toprol.  Instead, I am going to add carvedilol 3.125 mg twice daily.  3.  Hyperlipidemia:   He reports poor tolerance to statins in the past but I discussed with him the importance of these medications and he is willing to try small dose rosuvastatin 10 mg daily.  4.  Essential hypertension: Blood pressure does not seem to be controlled.  We have to check routine labs and consider increasing Entresto.  If EF is below 40%, we should also consider spironolactone or eplerenone.  Uptitrating carvedilol is going to be limited by heart rate.    Disposition:   FU with me in 3 weeks  Signed,  Lorine Bears, MD  08/16/2019 10:31 AM    [facility]

## 2019-08-16 NOTE — H&P (View-Only) (Signed)
Cardiology Office Note   Date:  08/16/2019   ID:  Glen Loman., DOB 1956/04/08, MRN [id number]  PCP:  Glen Billet, MD  Cardiologist:   Glen Sacramento, MD   Chief Complaint  Patient presents with  . OTHER    Ref by [name] for CHF with having chest pain and shortness of breath. Establish care for CHF; pt. saw [name] in the past. Meds reviewed by the pt. verbally.       History of Present Illness: [name]. is a 64 y.o. male who was referred by [name] for urgent evaluation of chest pain and shortness of breath. He has known history of coronary artery disease status post remote MI with previous LAD,diagonal and RCA stenting at [facility].  Cardiac catheterization in 2016 showed patent LAD and diagonal stents but with chronically occluded RCA and left to right collaterals.  He also has chronic systolic heart failure due to ischemic cardiomyopathy.  He has been followed at the heart failure clinic.  He reports hyperlipidemia with poor tolerance to statins as they make him feel bad. He had laminectomy and discectomy in October 2020.  He established with the heart failure clinic after that and was started on Entresto.  No recent EF evaluation. He is a previous smoker and quit about 7 years ago.  He has extensive family history of coronary artery disease. Over the last 2 weeks, he has experienced intermittent episodes of sharp chest discomfort lasting for 1 to 2 minutes both at rest and with exertion.  In addition, his exertional dyspnea has worsened significantly and he feels that he has no energy to do any activities.  He wakes up at night with back pain.    Past Medical History:  Diagnosis Date  . CAD (coronary artery disease)    a. s/p MI with LAD and Diag stenting @ [name];  b. 06/2015 Cath: LM nl, LAD 76m/d ISR, D1 30 ISR, RI min irregs, LCX 49m, OM1 min irregs, OM2 60, OM3 min irregs, RCA 100p/m ISR, 70d, L->R collats, EDP 47mmHg-->Med Rx.  . Chronic combined  systolic and diastolic CHF (congestive heart failure) ([place])    a. 06/2015 Echo: EF 20-25%, Gr3 DD.  [name] Essential hypertension   . Hyperlipidemia   . Hypokalemia    a. 06/2015 in setting of diuresis.  . Ischemic cardiomyopathy    a. 2011 EF 45% ([name]);  b. 06/2015 Echo: EF 20-25%, sev diff HK w/ inf/inflat AK, Gr 3 DD, mod MR, sev dil LA, mod dil RA, mild TR.    Past Surgical History:  Procedure Laterality Date  . BACK SURGERY  04/2019  . CARDIAC CATHETERIZATION N/A 06/10/2015   Procedure: Left Heart Cath;  Surgeon: Glen Hampshire, MD;  Location: [place] CV [facility];  Service: Cardiovascular;  Laterality: N/A;  . CORONARY STENT PLACEMENT       Current Outpatient Medications  Medication Sig Dispense Refill  . acetaminophen (TYLENOL) 500 MG tablet Take 1,000 mg by mouth at bedtime.    [name] aspirin 81 MG chewable tablet Chew 1 tablet (81 mg total) by mouth daily. 30 tablet 0  . furosemide (LASIX) 20 MG tablet Take 1 tablet (20 mg total) by mouth daily. 30 tablet 5  . gabapentin (NEURONTIN) 300 MG capsule Take 300 mg by mouth 3 (three) times daily.    . metoprolol succinate (TOPROL XL) 25 MG 24 hr tablet Take 1 tablet (25 mg total) by mouth daily. [place]  tablet 5  . sacubitril-valsartan (ENTRESTO) 24-26 MG Take 1 tablet by mouth 2 (two) times daily. 60 tablet 3   No current facility-administered medications for this visit.    Allergies:   Contrast media [iodinated diagnostic agents], Hydrocodone, Iohexol, Morphine and related, and Penicillins    Social History:  The patient  reports that he quit smoking about 8 years ago. He has never used smokeless tobacco. He reports that he does not drink alcohol or use drugs.   Family History:  The patient's family history includes Coronary artery disease in his father and mother; Diabetes in his mother.    ROS:  Please see the history of present illness.   Otherwise, review of systems are positive for none.   All other systems are reviewed and  negative.    PHYSICAL EXAM: VS:  BP (!) 150/82 (BP Location: Right Arm, Patient Position: Sitting, Cuff Size: Large)   Pulse 60   Ht 5\' 6"  (1.676 m)   Wt 215 lb 4 oz (97.6 kg)   SpO2 97%   BMI 34.74 kg/m  , BMI Body mass index is 34.74 kg/m. GEN: Well nourished, well developed, in no acute distress  HEENT: normal  Neck: no JVD, carotid bruits, or masses Cardiac: RRR; no murmurs, rubs, or gallops,no edema  Respiratory:  clear to auscultation bilaterally, normal work of breathing GI: soft, nontender, nondistended, + BS MS: no deformity or atrophy  Skin: warm and dry, no rash Neuro:  Strength and sensation are intact Psych: euthymic mood, full affect   EKG:  EKG is ordered today. The ekg ordered today demonstrates normal sinus rhythm with possible left atrial enlargement.  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   Recent Labs: No results found for requested labs within last 8760 hours.    Lipid Panel    Component Value Date/Time   CHOL 119 06/07/2015 2334   TRIG 171 (H) 06/07/2015 2334   HDL 25 (L) 06/07/2015 2334   CHOLHDL 4.8 06/07/2015 2334   VLDL 34 06/07/2015 2334   LDLCALC 60 06/07/2015 2334      Wt Readings from Last 3 Encounters:  08/16/19 215 lb 4 oz (97.6 kg)  08/16/19 216 lb (98 kg)  07/16/19 218 lb (98.9 kg)       PAD Screen 08/16/2019  Previous PAD dx? No  Previous surgical procedure? No  Pain with walking? No  Feet/toe relief with dangling? No  Painful, non-healing ulcers? No  Extremities discolored? No      ASSESSMENT AND PLAN:  1.  Coronary artery disease involving native coronary arteries with unstable angina: The patient recent episodes of chest pain and significant worsening of exertional dyspnea is highly concerning for unstable angina.  He has known history of extensive coronary artery disease and ischemic cardiomyopathy.  Due to that, I recommend urgent evaluation with a right and left cardiac  catheterization and possible PCI.  I discussed the procedure in details as well as risks and benefits.  2.  Chronic systolic heart failure due to ischemic cardiomyopathy: He needs EF evaluation and that can be done with cardiac cath.  He appears to be euvolemic.  We will do a right heart catheterization at the same time to evaluate pulmonary pressures and cardiac output.  Continue treatment with Entresto. His heart rate is somewhat on the low side and I am hesitant about adding Toprol.  Instead, I am going to add carvedilol 3.125 mg twice daily.  3.  Hyperlipidemia:  He reports poor tolerance to statins in the past but I discussed with him the importance of these medications and he is willing to try small dose rosuvastatin 10 mg daily.  4.  Essential hypertension: Blood pressure does not seem to be controlled.  We have to check routine labs and consider increasing Entresto.  If EF is below 40%, we should also consider spironolactone or eplerenone.  Uptitrating carvedilol is going to be limited by heart rate.    Disposition:   FU with me in 3 weeks  Signed,  Lorine Bears, MD  08/16/2019 10:31 AM    [facility]

## 2019-08-16 NOTE — Patient Instructions (Addendum)
Medication Instructions:  Your physician has recommended you make the following change in your medication:   1) STOP Metoprolol  2) START Carvedilol 3.125 mg twice daily. An Rx has been sent to your pharmacy.  3) START Rosuvastatin (Crestor) 10 mg daily. An Rx has been sent to your pharmacy.   *If you need a refill on your cardiac medications before your next appointment, please call your pharmacy*  Lab Work: Bmet and Cbc today.  You will need a COVID test today. Please report to the [facility] medical arts building drive up test site when you leave our office today. If you have labs (blood work) drawn today and your tests are completely normal, you will receive your results only by: [name] MyChart Message (if you have MyChart) OR . A paper copy in the mail If you have any lab test that is abnormal or we need to change your treatment, we will call you to review the results.  Testing/Procedures: Your physician has requested that you have a cardiac catheterization. Cardiac catheterization is used to diagnose and/or treat various heart conditions. Doctors may recommend this procedure for a number of different reasons. The most common reason is to evaluate chest pain. Chest pain can be a symptom of coronary artery disease (CAD), and cardiac catheterization can show whether plaque is narrowing or blocking your heart's arteries. This procedure is also used to evaluate the valves, as well as measure the blood flow and oxygen levels in different parts of your heart. For further information please visit [email or web address][email or web address]. Please follow instruction sheet, as given.    Follow-Up: At [facility], you and your health needs are our priority.  As part of our continuing mission to provide you with exceptional heart care, we have created designated Provider Care Teams.  These Care Teams include your primary Cardiologist (physician) and Advanced Practice Providers (APPs -  Physician Assistants and Nurse  Practitioners) who all work together to provide you with the care you need, when you need it.  Your next appointment:   3 week(s)  The format for your next appointment:   In Person  Provider:    You may see Dr. Fletcher Anon or one of the following Advanced Practice Providers on your designated Care Team:    Murray Hodgkins, NP  Christell Faith, PA-C  Marrianne Mood, PA-C   Other Instructions    [address], [place] [address] Dept: [phone] Loc: [place].  08/16/2019  You are scheduled for a Cardiac Catheterization on Monday, February 15 with Dr. Kathlyn Sacramento.  1. Please arrive at the [facility] medical mall at 9:30 am Special note: Every effort is made to have your procedure done on time. Please understand that emergencies sometimes delay scheduled procedures.  2. Diet: Do not eat solid foods after midnight.  The patient may have clear liquids until 5am upon the day of the procedure.  3. Labs: You will need to have blood drawn today Bmet and Cbc. Please report to the [facility] medical arts drive up test site today for your COVID test  4. Medication instructions in preparation for your procedure:   Contrast Allergy: Yes, Please take Prednisone 50mg  by mouth at: Thirteen hours prior to cath 8:00pm on Sunday Seven hours prior to cath 2:00am on Monday And prior to leaving home please take last dose of Prednisone 50mg  and Benadryl 50mg  by mouth.  Current Outpatient Medications (Cardiovascular):  .  furosemide (LASIX) 20 MG tablet, Take 1 tablet (20 mg total) by mouth daily. .  metoprolol succinate (TOPROL XL) 25 MG 24 hr tablet, Take 1 tablet (25 mg total) by mouth daily. .  sacubitril-valsartan (ENTRESTO) 24-26 MG, Take 1 tablet by mouth 2 (two) times daily.   Current Outpatient Medications (Analgesics):  .  acetaminophen (TYLENOL) 500 MG  tablet, Take 1,000 mg by mouth at bedtime. [name]  aspirin 81 MG chewable tablet, Chew 1 tablet (81 mg total) by mouth daily.   Current Outpatient Medications (Other):  .  gabapentin (NEURONTIN) 300 MG capsule, Take 300 mg by mouth 3 (three) times daily.    On the morning of your procedure, take your Aspirin and any morning medicines NOT listed above.  You may use sips of water.  5. Plan for one night stay--bring personal belongings. 6. Bring a current list of your medications and current insurance cards. 7. You MUST have a responsible person to drive you home. 8. Someone MUST be with you the first 24 hours after you arrive home or your discharge will be delayed. 9. Please wear clothes that are easy to get on and off and wear slip-on shoes.  Thank you for allowing [place] to care for you!   -- [place] [facility]

## 2019-08-16 NOTE — Patient Instructions (Signed)
Continue weighing daily and call for an overnight weight gain of > 2 pounds or a weekly weight gain of >5 pounds. 

## 2019-08-20 ENCOUNTER — Encounter
Admission: RE | Disposition: A | Discharge status: Home / Self Care | Payer: Self-pay | Source: Home / Self Care | Attending: Cardiovascular Disease

## 2019-08-20 ENCOUNTER — Encounter: Payer: Self-pay | Admitting: Cardiovascular Disease

## 2019-08-20 ENCOUNTER — Ambulatory Visit
Admission: RE | Admit: 2019-08-20 | Discharge: 2019-08-20 | Disposition: A | Discharge status: Home / Self Care | Payer: Self-pay | Attending: Cardiovascular Disease | Admitting: Cardiovascular Disease

## 2019-08-20 ENCOUNTER — Other Ambulatory Visit: Payer: Self-pay

## 2019-08-20 DIAGNOSIS — Z8249 Family history of ischemic heart disease and other diseases of the circulatory system: Secondary | ICD-10-CM | POA: Insufficient documentation

## 2019-08-20 DIAGNOSIS — Z87891 Personal history of nicotine dependence: Secondary | ICD-10-CM | POA: Insufficient documentation

## 2019-08-20 DIAGNOSIS — I25110 Atherosclerotic heart disease of native coronary artery with unstable angina pectoris: Secondary | ICD-10-CM | POA: Insufficient documentation

## 2019-08-20 DIAGNOSIS — I255 Ischemic cardiomyopathy: Secondary | ICD-10-CM | POA: Insufficient documentation

## 2019-08-20 DIAGNOSIS — Z880 Allergy status to penicillin: Secondary | ICD-10-CM | POA: Insufficient documentation

## 2019-08-20 DIAGNOSIS — Z79899 Other long term (current) drug therapy: Secondary | ICD-10-CM | POA: Insufficient documentation

## 2019-08-20 DIAGNOSIS — I272 Pulmonary hypertension, unspecified: Secondary | ICD-10-CM | POA: Insufficient documentation

## 2019-08-20 DIAGNOSIS — I200 Unstable angina: Secondary | ICD-10-CM

## 2019-08-20 DIAGNOSIS — I5022 Chronic systolic (congestive) heart failure: Secondary | ICD-10-CM | POA: Insufficient documentation

## 2019-08-20 DIAGNOSIS — Z888 Allergy status to other drugs, medicaments and biological substances status: Secondary | ICD-10-CM | POA: Insufficient documentation

## 2019-08-20 DIAGNOSIS — Z7982 Long term (current) use of aspirin: Secondary | ICD-10-CM | POA: Insufficient documentation

## 2019-08-20 DIAGNOSIS — Z885 Allergy status to narcotic agent status: Secondary | ICD-10-CM | POA: Insufficient documentation

## 2019-08-20 DIAGNOSIS — I252 Old myocardial infarction: Secondary | ICD-10-CM | POA: Insufficient documentation

## 2019-08-20 DIAGNOSIS — I110 Hypertensive heart disease with heart failure: Secondary | ICD-10-CM | POA: Insufficient documentation

## 2019-08-20 DIAGNOSIS — E785 Hyperlipidemia, unspecified: Secondary | ICD-10-CM | POA: Insufficient documentation

## 2019-08-20 DIAGNOSIS — Z955 Presence of coronary angioplasty implant and graft: Secondary | ICD-10-CM | POA: Insufficient documentation

## 2019-08-20 DIAGNOSIS — Z91041 Radiographic dye allergy status: Secondary | ICD-10-CM | POA: Insufficient documentation

## 2019-08-20 HISTORY — PX: RIGHT/LEFT HEART CATH AND CORONARY ANGIOGRAPHY: CATH118266

## 2019-08-20 SURGERY — RIGHT/LEFT HEART CATH AND CORONARY ANGIOGRAPHY
Anesthesia: Moderate Sedation

## 2019-08-20 MED ORDER — HEPARIN (PORCINE) IN NACL 1000-0.9 UT/500ML-% IV SOLN
INTRAVENOUS | Status: DC | PRN
  Administered 2019-08-20: 500 mL

## 2019-08-20 MED ORDER — SODIUM CHLORIDE 0.9% FLUSH: 3.0000 mL | INTRAVENOUS | Status: DC | PRN

## 2019-08-20 MED ORDER — FENTANYL CITRATE (PF) 100 MCG/2ML IJ SOLN
INTRAMUSCULAR | Status: AC
  Filled 2019-08-20: qty 2

## 2019-08-20 MED ORDER — SODIUM CHLORIDE 0.9 % IV SOLN: 250.0000 mL | INTRAVENOUS | Status: DC | PRN

## 2019-08-20 MED ORDER — ONDANSETRON HCL 4 MG/2ML IJ SOLN: 4.0000 mg | Freq: Four times a day (QID) | INTRAMUSCULAR | Status: DC | PRN

## 2019-08-20 MED ORDER — FENTANYL CITRATE (PF) 100 MCG/2ML IJ SOLN
INTRAMUSCULAR | Status: DC | PRN
  Administered 2019-08-20 (×2): 25 ug via INTRAVENOUS

## 2019-08-20 MED ORDER — DIPHENHYDRAMINE HCL 50 MG/ML IJ SOLN
INTRAMUSCULAR | Status: DC | PRN
  Administered 2019-08-20: 50 mg via INTRAVENOUS

## 2019-08-20 MED ORDER — SODIUM CHLORIDE 0.9% FLUSH: 3.0000 mL | Freq: Two times a day (BID) | INTRAVENOUS | Status: DC

## 2019-08-20 MED ORDER — ACETAMINOPHEN 325 MG PO TABS: 650.0000 mg | ORAL_TABLET | ORAL | Status: DC | PRN

## 2019-08-20 MED ORDER — MIDAZOLAM HCL 2 MG/2ML IJ SOLN
INTRAMUSCULAR | Status: DC | PRN
  Administered 2019-08-20 (×2): 1 mg via INTRAVENOUS

## 2019-08-20 MED ORDER — VERAPAMIL HCL 2.5 MG/ML IV SOLN
INTRAVENOUS | Status: AC
  Filled 2019-08-20: qty 2

## 2019-08-20 MED ORDER — MIDAZOLAM HCL 2 MG/2ML IJ SOLN
INTRAMUSCULAR | Status: AC
  Filled 2019-08-20: qty 2

## 2019-08-20 MED ORDER — HEPARIN (PORCINE) IN NACL 1000-0.9 UT/500ML-% IV SOLN
INTRAVENOUS | Status: AC
  Filled 2019-08-20: qty 1000

## 2019-08-20 MED ORDER — HEPARIN SODIUM (PORCINE) 1000 UNIT/ML IJ SOLN
INTRAMUSCULAR | Status: AC
  Filled 2019-08-20: qty 1

## 2019-08-20 MED ORDER — ASPIRIN 81 MG PO CHEW: 81.0000 mg | CHEWABLE_TABLET | ORAL | Status: DC

## 2019-08-20 MED ORDER — VERAPAMIL HCL 2.5 MG/ML IV SOLN
INTRAVENOUS | Status: DC | PRN
  Administered 2019-08-20: 2.5 mg via INTRA_ARTERIAL

## 2019-08-20 MED ORDER — SODIUM CHLORIDE 0.9 % IV SOLN: INTRAVENOUS | Status: DC

## 2019-08-20 MED ORDER — DIPHENHYDRAMINE HCL 50 MG/ML IJ SOLN
INTRAMUSCULAR | Status: AC
  Filled 2019-08-20: qty 1

## 2019-08-20 MED ORDER — IOHEXOL 300 MG/ML  SOLN
INTRAMUSCULAR | Status: DC | PRN
  Administered 2019-08-20: 70 mL

## 2019-08-20 SURGICAL SUPPLY — 8 items
CATH 5F 110X4 TIG (CATHETERS) ×1 IMPLANT
CATH BALLN WEDGE 5F 110CM (CATHETERS) ×1 IMPLANT
DEVICE RAD TR BAND REGULAR (VASCULAR PRODUCTS) ×1 IMPLANT
GLIDESHEATH SLEND SS 6F .021 (SHEATH) ×1 IMPLANT
KIT MANI 3VAL PERCEP (MISCELLANEOUS) ×2 IMPLANT
PACK CARDIAC CATH (CUSTOM PROCEDURE TRAY) ×2 IMPLANT
SHEATH GLIDE SLENDER 4/5FR (SHEATH) ×1 IMPLANT
WIRE ROSEN-J .035X260CM (WIRE) ×1 IMPLANT

## 2019-08-20 NOTE — Interval H&P Note (Signed)
Cath Lab Visit (complete for each Cath Lab visit)  Clinical Evaluation Leading to the Procedure:   ACS: No.  Non-ACS:    Anginal Classification: CCS IV  Anti-ischemic medical therapy: Minimal Therapy (1 class of medications)  Non-Invasive Test Results: No non-invasive testing performed  Prior CABG: No previous CABG  CHF: NYHA class 3      History and Physical Interval Note:  08/20/2019 11:46 AM  [name].  has presented today for surgery, with the diagnosis of RT LT Heart Cath   Unstable angina.  The various methods of treatment have been discussed with the patient and family. After consideration of risks, benefits and other options for treatment, the patient has consented to  Procedure(s): RIGHT/LEFT HEART CATH AND CORONARY ANGIOGRAPHY (N/A) as a surgical intervention.  The patient's history has been reviewed, patient examined, no change in status, stable for surgery.  I have reviewed the patient's chart and labs.  Questions were answered to the patient's satisfaction.     Lorine Bears

## 2019-08-20 NOTE — Progress Notes (Signed)
Pt. Took right wrist drsg. Off now and all drsgs. To IV sites. Pt. Uncooperative, refusing to speak. States "I'm going to work tomorrow, I don't care what you say. I don't have time for all that." Gave pt. DC instructions verbally and in writing for DC. Pt. Stable for DC home . Pt. Son to drive pt. Home.

## 2019-09-12 NOTE — Progress Notes (Signed)
Patient ID: Glen Cabal., male    DOB: 1955-07-17, 64 y.o.   MRN: [id number]  HPI  [name] is a 64 y/o male with a history of CAD, HTN, hyperlipidemia, hypokalemia, previous tobacco use and chronic heart failure.   Echo report from 05/04/2019 reviewed and showed an EF of 40-45% along with mild Glen. Echo report from 06/07/15 reviewed and showed an EF of 20-25% along with moderate Glen and mild TR.   Catheterization done 08/20/19 showed:  Prox RCA to Mid RCA lesion is 100% stenosed.  Dist RCA lesion is 70% stenosed.  There is moderate to severe left ventricular systolic dysfunction.  LV end diastolic pressure is normal.  The left ventricular ejection fraction is 25-35% by visual estimate.  1st Mrg lesion is 100% stenosed.  2nd Mrg lesion is 50% stenosed.  Prox LAD to Mid LAD lesion is 10% stenosed.  1st Diag lesion is 20% stenosed.  2nd Diag lesion is 100% stenosed.  Mid Cx to Dist Cx lesion is 80% stenosed.   1.  Significant underlying three-vessel coronary artery disease with patent stents in the LAD and diagonal without significant restenosis.  Chronically occluded RCA stents with right to right bridging and left-to-right collaterals.  Diffuse small vessel disease.  Distal left circumflex stenosis seems worse than 2016 but this supplies relatively small size OM 3 which has moderate diffuse atherosclerosis. 2.  Moderately to severely reduced LV systolic function with an EF of 25 to 35%. 3.  Right heart catheterization showed normal filling pressures, mild pulmonary hypertension and normal cardiac output. 4. Recommend aggressive treatment of heart failure and up titration of heart failure medications as tolerated.  Consider adding spironolactone upon follow-up.  Admitted 04/30/2019 due to significant lumbar stenosis at L2/3. He underwent an L2/3 laminectomy and diskectomy. Left AMA 3 days later. Was in the ED 04/27/2019 due to back pain and shortness of breath due to HF. Treated  and released.   He presents today for a follow-up visit with a chief complaint of moderate shortness of breath upon minimal exertion. He describes this as chronic in nature having been present for several years. He has associated fatigue, leg weakness, difficulty sleeping and chronic back pain along with this. He denies any dizziness, abdominal distention, palpitations, pedal edema, cough or weight gain. Says that he's had a couple episodes of chest pain since he was last here but nothing like before his catheterization.   He also endorses snoring and waking himself up gasping at times. Says that he wakes up just as tired as when he went to bed.   Past Medical History:  Diagnosis Date  . CAD (coronary artery disease)    a. s/p MI with LAD and Diag stenting @ [name];  b. 06/2015 Cath: LM nl, LAD 55m/d ISR, D1 30 ISR, RI min irregs, LCX 60m, OM1 min irregs, OM2 60, OM3 min irregs, RCA 100p/m ISR, 70d, L->R collats, EDP 39mmHg-->Med Rx.  . Chronic combined systolic and diastolic CHF (congestive heart failure) ([place])    a. 06/2015 Echo: EF 20-25%, Gr3 DD.  [name] Essential hypertension   . Hyperlipidemia   . Hypokalemia    a. 06/2015 in setting of diuresis.  . Ischemic cardiomyopathy    a. 2011 EF 45% ([name]);  b. 06/2015 Echo: EF 20-25%, sev diff HK w/ inf/inflat AK, Gr 3 DD, mod Glen, sev dil LA, mod dil RA, mild TR.   Past Surgical History:  Procedure Laterality Date  . BACK SURGERY  04/2019  . CARDIAC CATHETERIZATION N/A 06/10/2015   Procedure: Left Heart Cath;  Surgeon: Glen Ouch, MD;  Location: [facility] INVASIVE CV LAB;  Service: Cardiovascular;  Laterality: N/A;  . CORONARY STENT PLACEMENT    . RIGHT/LEFT HEART CATH AND CORONARY ANGIOGRAPHY N/A 08/20/2019   Procedure: RIGHT/LEFT HEART CATH AND CORONARY ANGIOGRAPHY;  Surgeon: Glen Ouch, MD;  Location: [facility] IN[facility];  Service: Cardiovascular;  Laterality: N/A;   Family History  Problem Relation Age of Onset  . Coronary artery  disease Mother   . Diabetes Mother   . Coronary artery disease Father    Social History   Tobacco Use  . Smoking status: Former Smoker    Quit date: 06/10/2011    Years since quitting: 8.2  . Smokeless tobacco: Never Used  Substance Use Topics  . Alcohol use: No    Alcohol/week: 0.0 standard drinks   Allergies  Allergen Reactions  . Contrast Media [Iodinated Diagnostic Agents] Shortness Of Breath  . Iohexol Shortness Of Breath     Onset Date: [id number]   . Hydrocodone Itching  . Morphine And Related     Lost control   . Penicillins     Unknown reaction Did it involve swelling of the face/tongue/throat, SOB, or low BP? Unknown Did it involve sudden or severe rash/hives, skin peeling, or any reaction on the inside of your mouth or nose? Unknown Did you need to seek medical attention at a hospital or doctor's office? Yes When did it last happen?Childhood allergy  If all above answers are "NO", may proceed with cephalosporin use.    Prior to Admission medications   Medication Sig Start Date End Date Taking? Authorizing Provider  acetaminophen (TYLENOL) 500 MG tablet Take 1,000 mg by mouth at bedtime.   Yes [provider]  aspirin 81 MG chewable tablet Chew 1 tablet (81 mg total) by mouth daily. 06/11/15  Yes Glen Bilberry, MD  carvedilol (COREG) 3.125 MG tablet Take 1 tablet (3.125 mg total) by mouth 2 (two) times daily. 08/16/19 11/14/19 Yes Glen Ouch, MD  Clotrimazole (JOCK ITCH RELIEF EX) Apply 1 application topically daily as needed (jock itch).   Yes [provider]  furosemide (LASIX) 20 MG tablet Take 1 tablet (20 mg total) by mouth daily. 08/16/19 08/15/20 Yes [name], Inetta Fermo A, FNP  rosuvastatin (CRESTOR) 10 MG tablet Take 1 tablet (10 mg total) by mouth daily. 08/16/19 11/14/19 Yes Glen Ouch, MD  sacubitril-valsartan (ENTRESTO) 24-26 MG Take 1 tablet by mouth 2 (two) times daily. 04/30/19  Yes Glen Freeze, FNP    Review of Systems   Constitutional: Positive for fatigue (tire easily). Negative for appetite change.  HENT: Positive for congestion and hearing loss. Negative for postnasal drip and sneezing.   Eyes: Negative.   Respiratory: Positive for shortness of breath (easily). Negative for cough.        + snoring  Cardiovascular: Negative for chest pain, palpitations and leg swelling.  Gastrointestinal: Negative for abdominal distention and abdominal pain.  Endocrine: Negative.   Genitourinary: Negative.   Musculoskeletal: Positive for back pain. Negative for neck pain.  Skin: Negative.   Allergic/Immunologic: Negative.   Neurological: Positive for weakness (in legs). Negative for dizziness and light-headedness.  Hematological: Negative for adenopathy. Does not bruise/bleed easily.  Psychiatric/Behavioral: Positive for sleep disturbance (sleeping on 1 pillow although not sleeping well.). Negative for dysphoric mood. The patient is not nervous/anxious.    Vitals:   09/13/19 0834  BP: [name])  150/89  Pulse: (!) 54  Resp: 20  SpO2: 98%  Weight: 214 lb (97.1 kg)  Height: 5\' 6"  (1.676 m)   Wt Readings from Last 3 Encounters:  09/13/19 214 lb (97.1 kg)  08/20/19 217 lb (98.4 kg)  08/16/19 215 lb 4 oz (97.6 kg)   Lab Results  Component Value Date   CREATININE 0.81 08/16/2019   CREATININE 0.92 06/10/2015   CREATININE 1.06 06/08/2015    Physical Exam Vitals and nursing note reviewed.  Constitutional:      Appearance: He is well-developed.  HENT:     Head: Normocephalic and atraumatic.     Right Ear: Decreased hearing noted.     Left Ear: Decreased hearing noted.  Neck:     Vascular: No JVD.  Cardiovascular:     Rate and Rhythm: Regular rhythm. Bradycardia present.  Pulmonary:     Effort: Pulmonary effort is normal. No respiratory distress.     Breath sounds: No wheezing or rales.  Abdominal:     Palpations: Abdomen is soft.     Tenderness: There is no abdominal tenderness.  Musculoskeletal:      Cervical back: Normal range of motion and neck supple.     Right lower leg: No tenderness. No edema.     Left lower leg: No tenderness. No edema.  Skin:    General: Skin is warm and dry.  Neurological:     General: No focal deficit present.     Mental Status: He is alert and oriented to person, place, and time.  Psychiatric:        Mood and Affect: Mood normal.        Behavior: Behavior normal.    Assessment & Plan:  1: Chronic heart failure with reduced ejection fraction- - NYHA class III - euvolemic today - not weighing daily but does have scales; Instructed to weigh daily and call for an overnight weight gain of >2 pounds or a weekly weight gain of >5 pounds - weight down 2 pounds from last visit here 1 month ago - says that he's not been as active since he had his back surgery - not adding salt to his food but does eat out "often" as he lives alone; eats at [employer] or [employer] often and is aware that foods with sauces have more sodium.  - saw cardiology [employer] [facility]) 08/16/19 & returns later today - pending cardiology visit today will increase his entresto to 49/51mg  BID; 2 week sample bottle given and will send in a RX to [facility] after he sees cardiology - will check BMP at his next visit - unable to titrate up carvedilol due to bradycardia - pro-BNP done 05/02/2019 [address]) was 949.0 - got his flu vaccine for this season  2: HTN- - BP mildly elevated today - hasn't seen PCP [facility]) in years; instructed him to call his office and get an appointment scheduled.  - BMP 08/16/19 reviewed and showed sodium 139, potassium 3.6,  creatinine 0.81 and GFR >60  3: Snoring- - will refer patient for a sleep study to rule out sleep apnea   Patient did not bring his medications nor a list. Each medication was verbally reviewed with the patient and he was encouraged to bring the bottles to every visit to confirm accuracy of list.   Return in 1 month or sooner for any questions/problems before  then.

## 2019-09-13 ENCOUNTER — Ambulatory Visit (INDEPENDENT_AMBULATORY_CARE_PROVIDER_SITE_OTHER): Payer: Self-pay

## 2019-09-13 ENCOUNTER — Ambulatory Visit (INDEPENDENT_AMBULATORY_CARE_PROVIDER_SITE_OTHER): Payer: Self-pay | Admitting: Nurse Practitioner

## 2019-09-13 ENCOUNTER — Other Ambulatory Visit: Payer: Self-pay

## 2019-09-13 ENCOUNTER — Encounter: Payer: Self-pay | Admitting: Family

## 2019-09-13 ENCOUNTER — Encounter: Payer: Self-pay | Admitting: Nurse Practitioner

## 2019-09-13 ENCOUNTER — Ambulatory Visit: Payer: Self-pay | Attending: Family | Admitting: Family

## 2019-09-13 VITALS — BP 150/89 | HR 54 | Resp 20 | Ht 66.0 in | Wt 214.0 lb

## 2019-09-13 VITALS — BP 144/84 | HR 64 | Ht 66.0 in | Wt 214.1 lb

## 2019-09-13 DIAGNOSIS — I498 Other specified cardiac arrhythmias: Secondary | ICD-10-CM

## 2019-09-13 DIAGNOSIS — I5042 Chronic combined systolic (congestive) and diastolic (congestive) heart failure: Secondary | ICD-10-CM

## 2019-09-13 DIAGNOSIS — E785 Hyperlipidemia, unspecified: Secondary | ICD-10-CM | POA: Insufficient documentation

## 2019-09-13 DIAGNOSIS — Z955 Presence of coronary angioplasty implant and graft: Secondary | ICD-10-CM | POA: Insufficient documentation

## 2019-09-13 DIAGNOSIS — R0683 Snoring: Secondary | ICD-10-CM | POA: Insufficient documentation

## 2019-09-13 DIAGNOSIS — Z91041 Radiographic dye allergy status: Secondary | ICD-10-CM | POA: Insufficient documentation

## 2019-09-13 DIAGNOSIS — I2510 Atherosclerotic heart disease of native coronary artery without angina pectoris: Secondary | ICD-10-CM | POA: Insufficient documentation

## 2019-09-13 DIAGNOSIS — I10 Essential (primary) hypertension: Secondary | ICD-10-CM

## 2019-09-13 DIAGNOSIS — I5022 Chronic systolic (congestive) heart failure: Secondary | ICD-10-CM

## 2019-09-13 DIAGNOSIS — Z880 Allergy status to penicillin: Secondary | ICD-10-CM | POA: Insufficient documentation

## 2019-09-13 DIAGNOSIS — Z7982 Long term (current) use of aspirin: Secondary | ICD-10-CM | POA: Insufficient documentation

## 2019-09-13 DIAGNOSIS — I110 Hypertensive heart disease with heart failure: Secondary | ICD-10-CM | POA: Insufficient documentation

## 2019-09-13 DIAGNOSIS — Z79899 Other long term (current) drug therapy: Secondary | ICD-10-CM | POA: Insufficient documentation

## 2019-09-13 DIAGNOSIS — I255 Ischemic cardiomyopathy: Secondary | ICD-10-CM | POA: Insufficient documentation

## 2019-09-13 DIAGNOSIS — Z87891 Personal history of nicotine dependence: Secondary | ICD-10-CM | POA: Insufficient documentation

## 2019-09-13 DIAGNOSIS — Z8249 Family history of ischemic heart disease and other diseases of the circulatory system: Secondary | ICD-10-CM | POA: Insufficient documentation

## 2019-09-13 DIAGNOSIS — Z833 Family history of diabetes mellitus: Secondary | ICD-10-CM | POA: Insufficient documentation

## 2019-09-13 DIAGNOSIS — I252 Old myocardial infarction: Secondary | ICD-10-CM | POA: Insufficient documentation

## 2019-09-13 DIAGNOSIS — Z885 Allergy status to narcotic agent status: Secondary | ICD-10-CM | POA: Insufficient documentation

## 2019-09-13 DIAGNOSIS — I25119 Atherosclerotic heart disease of native coronary artery with unspecified angina pectoris: Secondary | ICD-10-CM

## 2019-09-13 MED ORDER — COLCHICINE 0.6 MG PO TABS: 0.6000 mg | ORAL_TABLET | Freq: Every day | ORAL | 3 refills | Status: DC | PRN

## 2019-09-13 MED ORDER — EZETIMIBE 10 MG PO TABS: 10.0000 mg | ORAL_TABLET | Freq: Every day | ORAL | 3 refills | Status: DC

## 2019-09-13 NOTE — Patient Instructions (Addendum)
Medication Instructions:  1- START Colchicine as needed Take 1 tablet (0.6 mg total) by mouth daily as needed (gout). 2- STOP Crestor  3- START Zetia Take 1 tablet (10 mg total) by mouth daily.  *If you need a refill on your cardiac medications before your next appointment, please call your pharmacy*   Lab Work: Your physician recommends that you return for lab work in: 1 weeks at the medical mall. ([facility])  No appt is needed. Hours are M-F 7AM- 6 PM.  If you have labs (blood work) drawn today and your tests are completely normal, you will receive your results only by: [name] MyChart Message (if you have MyChart) OR . A paper copy in the mail If you have any lab test that is abnormal or we need to change your treatment, we will call you to review the results.   Testing/Procedures: A zio monitor was placed today. It will remain on for 3 days. You will then return monitor and event diary in provided box. It takes 1-2 weeks for report to be downloaded and returned to [place]. We will call you with the results. If monitor falls of or has orange flashing light, please call Zio for further instructions.    Follow-Up: At [facility], you and your health needs are our priority.  As part of our continuing mission to provide you with exceptional heart care, we have created designated Provider Care Teams.  These Care Teams include your primary Cardiologist (physician) and Advanced Practice Providers (APPs -  Physician Assistants and Nurse Practitioners) who all work together to provide you with the care you need, when you need it.  We recommend signing up for the patient portal called "MyChart".  Sign up information is provided on this After Visit Summary.  MyChart is used to connect with patients for Virtual Visits (Telemedicine).  Patients are able to view lab/test results, encounter notes, upcoming appointments, etc.  Non-urgent messages can be sent to your provider as well.   To learn more about what you  can do with MyChart, go to [email or web address].    Your next appointment:   2 month(s)  The format for your next appointment:   In Person  Provider:    You may see Lorine Bears, MD or Nicolasa Ducking, NP.

## 2019-09-13 NOTE — Patient Instructions (Addendum)
Resume weighing daily and call for an overnight weight gain of > 2 pounds or a weekly weight gain of >5 pounds. 

## 2019-09-13 NOTE — Progress Notes (Signed)
Office Visit    Patient Name: [name]. 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  Past Medical History    Past Medical History:  Diagnosis Date   CAD (coronary artery disease)    a. s/p MI with LAD and Diag stenting @ [name];  b. 06/2015 Cath: LAD 58m/d ISR, 100 RCA (ISR) w/ L->R collats, otw mod nonobs dzs-->Med Rx; c. 08/2019 Cath: LM nl, LAD 10p/m ISR, D1 20, D2 100, RI min irregs, LCX 59m/d, OM1 100, OM2 50, RCA 100p, 70d. RPDA fills via collats from LAD. EF 25-35%-->Med Rx.   Chronic combined systolic and diastolic CHF (congestive heart failure) ([place])    a. 06/2015 Echo: EF 20-25%, Gr3 DD; b. 04/2019 Echo: EF 40-45% w/ inf, infsept, ap ant, mid ant HK. Gr1 DD. Mild MR. Nl RV fxn.   Essential hypertension    Hyperlipidemia    Hypokalemia    a. 06/2015 in setting of diuresis.   Ischemic cardiomyopathy    a. 2011 EF 45% ([name]);  b. 06/2015 Echo: EF 20-25%; c. 04/2019 Echo: EF 40-45%; d. 08/2019 LV gram: EF 25-35%.   Past Surgical History:  Procedure Laterality Date   BACK SURGERY  04/2019   CARDIAC CATHETERIZATION N/A 06/10/2015   Procedure: Left Heart Cath;  Surgeon: Wellington Hampshire, MD;  Location: [facility];  Service: Cardiovascular;  Laterality: N/A;   CORONARY STENT PLACEMENT     RIGHT/LEFT HEART CATH AND CORONARY ANGIOGRAPHY N/A 08/20/2019   Procedure: RIGHT/LEFT HEART CATH AND CORONARY ANGIOGRAPHY;  Surgeon: Wellington Hampshire, MD;  Location: [facility];  Service: Cardiovascular;   Laterality: N/A;   Allergies  Allergies  Allergen Reactions   Contrast Media [Iodinated Diagnostic Agents] Shortness Of Breath   Iohexol Shortness Of Breath     Onset Date: [id number]    Hydrocodone Itching   Morphine And Related     Lost control    Penicillins     Unknown reaction Did it involve swelling of the face/tongue/throat, SOB, or low BP? Unknown Did it involve sudden or severe rash/hives, skin peeling, or any reaction on the inside of your mouth or nose? Unknown Did you need to seek medical attention at a hospital or doctor's office? Yes When did it last happen?Childhood allergy  If all above answers are NO, may proceed with cephalosporin use.     History of Present Illness    64 year old male with the above complex past medical history including CAD, ischemic cardiomyopathy, chronic combined systolic diastolic congestive heart failure with an EF of 25 and 35%, hypertension, and hyperlipidemia.  He is status post remote myocardial infarction with previous LAD, diagonal, and RCA stenting at [facility].  Cardiac catheterization 2016 showed patent LAD and diagonal stents but a chronically occluded RCA with left-to-right collaterals.  Other history includes chronic combined systolic diastolic congestive heart failure and ischemic cardiomyopathy with an EF of 20 to 25% by echocardiogram in December 2016, hypertension, hyperlipidemia, and remote tobacco abuse.  Echo in 04/2019 [facility]) showed EF of 40-45%.  He was recently seen in clinic on February  11 with reports of intermittent rest and exertional chest discomfort as well as exertional dyspnea and progressive fatigue.  Decision was made to pursue diagnostic catheterization which was performed on February 15 revealing relatively stable coronary anatomy with patent LAD and diagonal stents and known occlusion of the RCA with left-to-right collaterals.  Circumflex disease was worse since 2016 but was supplying a small area of  myocardium.  Filling pressures were normal and continued medical therapy was recommended.  EF by ventriculography was 25-35%.  Since his catheterization, he has cont to have occasional, fleeting, sharp chest pain lasting about a second resolving spontaneously.  He has not had any prolonged angina.  He also notes easy fatigability and persistent dyspnea on exertion.  He says some days he can walk a mile without any issue and other days he can only walk a few feet prior to experiencing dyspnea.  His weight has been coming down and he thinks this is partially related to being tired toward the end of the day and sometimes skipping dinner.  He does continue to work.  He occasionally experiences episodes of lightheadedness that come on suddenly and last up to 10 minutes.  He has not had any syncope but is no longer getting up on ladders at work.  He denies palpitations, PND, orthopnea, edema, or early satiety.  He has been taking Crestor only a few days a week because it causes severe leg and low back pain.  He would like to come off of it.  Home Medications    Prior to Admission medications   Medication Sig Start Date End Date Taking? Authorizing Provider  acetaminophen (TYLENOL) 500 MG tablet Take 1,000 mg by mouth at bedtime.    [provider]  aspirin 81 MG chewable tablet Chew 1 tablet (81 mg total) by mouth daily. 06/11/15   Auburn Bilberry, MD  carvedilol (COREG) 3.125 MG tablet Take 1 tablet (3.125 mg total) by mouth 2 (two) times daily. 08/16/19 11/14/19  Iran Ouch, MD  Clotrimazole (JOCK ITCH RELIEF EX) Apply 1 application topically daily as needed (jock itch).    [provider]  furosemide (LASIX) 20 MG tablet Take 1 tablet (20 mg total) by mouth daily. 08/16/19 08/15/20  Delma Freeze, FNP  predniSONE (DELTASONE) 50 MG tablet Take 1 tablet by mouth 13 hours prior to your procedure, 1 tablet 7 hours prior, 1 tablet prior to leaving home. Patient taking differently: Take 50  mg by mouth See admin instructions. Take 50 mg by mouth 13 hours prior to your procedure, 50 mg 7 hours prior, 50 mg prior to leaving home. 08/16/19   Iran Ouch, MD  rosuvastatin (CRESTOR) 10 MG tablet Take 1 tablet (10 mg total) by mouth daily. 08/16/19 11/14/19  Iran Ouch, MD  sacubitril-valsartan (ENTRESTO) 24-26 MG Take 1 tablet by mouth 2 (two) times daily. 04/30/19   Delma Freeze, FNP    Review of Systems    Ongoing intermittent and fleeting sharp chest pain.  Ongoing fatigue with reduced activity tolerance and dyspnea on exertion.  Leg and low back muscular pain after he takes Crestor.  Occasional lightheadedness lasting up to 10 minutes.  He denies palpitations, PND, orthopnea, syncope, edema, or early satiety.  All other systems reviewed and are otherwise negative except as noted above.  Physical Exam    VS:  BP (!) 144/84 (BP Location: Left Arm, Patient Position: Sitting, Cuff Size: Normal)    Pulse 64    Ht 5'  6" (1.676 m)    Wt 214 lb 2 oz (97.1 kg)    SpO2 98%    BMI 34.56 kg/m  , BMI Body mass index is 34.56 kg/m. GEN: Well nourished, well developed, in no acute distress. HEENT: normal. Neck: Supple, no JVD, carotid bruits, or masses. Cardiac: Irregular, no murmurs, rubs, or gallops. No clubbing, cyanosis, edema.  Radials/PT 2+ and equal bilaterally.  Respiratory:  Respirations regular and unlabored, diminished breath sounds bilaterally. GI: Soft, nontender, nondistended, BS + x 4. MS: no deformity or atrophy. Skin: warm and dry, no rash. Neuro:  Strength and sensation are intact. Psych: Normal affect.  Accessory Clinical Findings    ECG personally reviewed by me today - sinus rhythm, 63, ventricular bigeminy, inf infarct, ant infarct  - no acute changes.  Lab Results  Component Value Date   WBC 4.6 08/16/2019   HGB 15.6 08/16/2019   HCT 43.9 08/16/2019   MCV 81.4 08/16/2019   PLT 173 08/16/2019   Lab Results  Component Value Date   CREATININE 0.81  08/16/2019   BUN 14 08/16/2019   NA 139 08/16/2019   K 3.6 08/16/2019   CL 107 08/16/2019   CO2 22 08/16/2019   Lab Results  Component Value Date   ALT 15 (L) 06/07/2015   AST 24 06/07/2015   ALKPHOS 83 06/07/2015   BILITOT 2.1 (H) 06/07/2015   Lab Results  Component Value Date   CHOL 119 06/07/2015   HDL 25 (L) 06/07/2015   LDLCALC 60 06/07/2015   TRIG 171 (H) 06/07/2015   CHOLHDL 4.8 06/07/2015     Assessment & Plan    1.  Chronic combined systolic and diastolic congestive heart failure/ischemic cardiomyopathy: Recent diagnostic catheterization revealed stable coronary artery disease with an EF of 25-35%.  Filling pressures were normal at that time.  Continued medical therapy was recommended and he has since been seen in heart failure clinic.  He continues to have dyspnea on exertion though just how much is variable.  He is euvolemic on examination today.  He was seen in heart failure clinic earlier this morning and Entresto was  titrated to 49-51 mg twice daily this morning.  I will arrange for a follow-up basic metabolic panel in 1 week.  If labs are stable at that time, I will plan to add spironolactone 25 mg daily.  He remains on carvedilol therapy.  He has follow-up in heart failure clinic in early April, at which point, provided stability and adequate blood pressure, we could look to titrate Entresto further.  Once medications are optimized, would plan to repeat echocardiogram and reevaluate candidacy for ICD therapy.  2.  Coronary artery disease: Recent catheterization in the setting sharp and fleeting chest pain, revealing stable anatomy with a chronic total occlusion of the RCA and patent LAD and diagonal stents.  He did have some progression of circumflex disease though this was felt to be serving a small territory and thus medical therapy was recommended.  He has had 1 or 2 episodes of sharp and fleeting chest pain lasting just a few seconds and resolving spontaneously since  his catheterization.  He remains on aspirin, beta-blocker.  He is also on rosuvastatin however notes that he has been having significant myalgias involving his lower back and legs after every dose.  He is only taking it a couple times a week recently.  He prefers to come off of it.   He was also intolerant to atorvastatin in the past.  I have asked him to hold his rosuvastatin and have provided him with a prescription for Zetia 10 mg daily.   3.  Ventricular bigeminy: On ECG today, patient is in ventricular bigeminy.  He is asymptomatic today though does note easy fatigability and occasional lightheadedness at home, coming on suddenly and lasting up to 10 minutes at a time.  Plan for follow-up basic metabolic panel as above and I will also place a 3-day ZIO monitor to assess PVC burden and look for any sustained ventricular arrhythmias.  4.  Essential hypertension: Pressure is elevated today and as noted, Entresto dose was increased by heart failure clinic this morning.  5.  Hyperlipidemia: As above, patient complaining of myalgias following Crestor doses.  Previously intolerant to atorvastatin as well.  Will stop Crestor and provided with prescription for Zetia.  Can plan to follow-up lipids and LFTs at next visit.  6.  Disposition: Follow-up basic metabolic panel.  Follow-up ZIO monitoring for PVC burden.  He has follow-up in heart failure clinic on April 11.  Follow-up here in 2 months or sooner if necessary.  Nicolasa Ducking, NP 09/13/2019, 10:29 AM

## 2019-09-20 ENCOUNTER — Other Ambulatory Visit
Admission: RE | Admit: 2019-09-20 | Discharge: 2019-09-20 | Disposition: A | Discharge status: Home / Self Care | Payer: Self-pay | Source: Ambulatory Visit | Attending: Nurse Practitioner | Admitting: Nurse Practitioner

## 2019-09-20 ENCOUNTER — Other Ambulatory Visit: Payer: Self-pay

## 2019-09-20 DIAGNOSIS — I5042 Chronic combined systolic (congestive) and diastolic (congestive) heart failure: Secondary | ICD-10-CM | POA: Insufficient documentation

## 2019-09-20 LAB — BASIC METABOLIC PANEL
Anion gap: 9 (ref 5–15)
BUN: 11 mg/dL (ref 8–23)
CO2: 24 mmol/L (ref 22–32)
Calcium: 8.8 mg/dL — ABNORMAL LOW (ref 8.9–10.3)
Chloride: 105 mmol/L (ref 98–111)
Creatinine, Ser: 0.82 mg/dL (ref 0.61–1.24)
GFR calc Af Amer: >60 mL/min (ref >60)
GFR calc non Af Amer: >60 mL/min (ref >60)
Glucose, Bld: 196 mg/dL — ABNORMAL HIGH (ref 70–99)
Potassium: 4.4 mmol/L (ref 3.5–5.1)
Sodium: 138 mmol/L (ref 135–145)

## 2019-09-21 ENCOUNTER — Telehealth: Payer: Self-pay

## 2019-09-21 DIAGNOSIS — I5042 Chronic combined systolic (congestive) and diastolic (congestive) heart failure: Secondary | ICD-10-CM

## 2019-09-21 MED ORDER — SPIRONOLACTONE 25 MG PO TABS: 25.0000 mg | ORAL_TABLET | Freq: Every day | ORAL | 3 refills | Status: DC

## 2019-09-21 NOTE — Telephone Encounter (Signed)
Call to patient to discuss results and POC. At the end of call we got disconnected so I called back and got vm.   I left detailed message of POC and encouraged him to call back with any further questions or concerns.   Orders placed.

## 2019-09-21 NOTE — Telephone Encounter (Signed)
Pt verbalized understanding and in agreement with POC. He is calling to verify that he will be taking both spironolactone and lasix.  Routing to Ward Givens, NP to verify.

## 2019-09-21 NOTE — Telephone Encounter (Signed)
Spoke with patient regarding recommendation.  Patient verbalizes understanding and is agreeable to plan of care. Advised patient to call back with any issues or concerns.   

## 2019-09-21 NOTE — Telephone Encounter (Signed)
-----   Message from Creig Hines, NP sent at 09/20/2019  3:53 PM EDT ----- Renal fxn, lytes stable.  We discussed potentially adding spironolactone at this time if kidneys and potassium looked ok, which they do.  I'd like for him to start taking spironolactone 25mg  daily and have f/u bmet in 1 week.

## 2019-09-21 NOTE — Telephone Encounter (Signed)
Yes, both Lasix and spironolactone.

## 2019-10-01 ENCOUNTER — Telehealth: Payer: Self-pay

## 2019-10-01 ENCOUNTER — Other Ambulatory Visit: Payer: Self-pay | Admitting: Nurse Practitioner

## 2019-10-01 DIAGNOSIS — I493 Ventricular premature depolarization: Secondary | ICD-10-CM

## 2019-10-01 MED ORDER — CARVEDILOL 6.25 MG PO TABS: 6.2500 mg | ORAL_TABLET | Freq: Two times a day (BID) | ORAL | 3 refills | Status: DC

## 2019-10-01 NOTE — Telephone Encounter (Signed)
-----   Message from Creig Hines, NP sent at 10/01/2019  8:32 AM EDT ----- Monitoring showed frequent extra beats from the  bottom chambers - accounting for 11.8% of all heartbeats.  He also had a few brief runs of fast beats from both the top and bottom chambers.  Recent labs showed stable potassium.  Please have him increase his carvedilol to 6.25mg   BID. He is supposed to have a bmet drawn this week, which hasn't been done yet.  Pls remind him of need for f/u bmet (recent spiro start) and I will also place order for Mg.

## 2019-10-01 NOTE — Telephone Encounter (Signed)
Call to patient to review results. rx updated.   Pt has not done labs yet as he has not started new med ordered at last ov. He will pick up rx and do labs in 1 week.   No further questions at this time. Agreeable to POC.   Advised pt to call for any further questions or concerns.

## 2019-10-06 ENCOUNTER — Other Ambulatory Visit: Payer: Self-pay

## 2019-10-06 ENCOUNTER — Emergency Department: Payer: Self-pay

## 2019-10-06 ENCOUNTER — Inpatient Hospital Stay
Admission: EM | Admit: 2019-10-06 | Discharge: 2019-10-11 | DRG: 246 | Disposition: A | Discharge status: Home Health | Payer: Self-pay | Attending: Internal Medicine | Admitting: Internal Medicine

## 2019-10-06 DIAGNOSIS — I200 Unstable angina: Secondary | ICD-10-CM | POA: Diagnosis present

## 2019-10-06 DIAGNOSIS — Z885 Allergy status to narcotic agent status: Secondary | ICD-10-CM

## 2019-10-06 DIAGNOSIS — I5043 Acute on chronic combined systolic (congestive) and diastolic (congestive) heart failure: Secondary | ICD-10-CM | POA: Diagnosis present

## 2019-10-06 DIAGNOSIS — R29700 NIHSS score 0: Secondary | ICD-10-CM | POA: Diagnosis not present

## 2019-10-06 DIAGNOSIS — I10 Essential (primary) hypertension: Secondary | ICD-10-CM | POA: Diagnosis present

## 2019-10-06 DIAGNOSIS — I255 Ischemic cardiomyopathy: Secondary | ICD-10-CM | POA: Diagnosis present

## 2019-10-06 DIAGNOSIS — I5022 Chronic systolic (congestive) heart failure: Secondary | ICD-10-CM | POA: Diagnosis present

## 2019-10-06 DIAGNOSIS — Z833 Family history of diabetes mellitus: Secondary | ICD-10-CM

## 2019-10-06 DIAGNOSIS — M1990 Unspecified osteoarthritis, unspecified site: Secondary | ICD-10-CM | POA: Diagnosis present

## 2019-10-06 DIAGNOSIS — Z955 Presence of coronary angioplasty implant and graft: Secondary | ICD-10-CM

## 2019-10-06 DIAGNOSIS — R079 Chest pain, unspecified: Secondary | ICD-10-CM

## 2019-10-06 DIAGNOSIS — H8110 Benign paroxysmal vertigo, unspecified ear: Secondary | ICD-10-CM | POA: Diagnosis not present

## 2019-10-06 DIAGNOSIS — I252 Old myocardial infarction: Secondary | ICD-10-CM

## 2019-10-06 DIAGNOSIS — I639 Cerebral infarction, unspecified: Secondary | ICD-10-CM | POA: Diagnosis not present

## 2019-10-06 DIAGNOSIS — Z87891 Personal history of nicotine dependence: Secondary | ICD-10-CM

## 2019-10-06 DIAGNOSIS — G8929 Other chronic pain: Secondary | ICD-10-CM | POA: Diagnosis present

## 2019-10-06 DIAGNOSIS — Z880 Allergy status to penicillin: Secondary | ICD-10-CM

## 2019-10-06 DIAGNOSIS — I493 Ventricular premature depolarization: Secondary | ICD-10-CM | POA: Diagnosis not present

## 2019-10-06 DIAGNOSIS — I25110 Atherosclerotic heart disease of native coronary artery with unstable angina pectoris: Secondary | ICD-10-CM | POA: Diagnosis present

## 2019-10-06 DIAGNOSIS — Z8249 Family history of ischemic heart disease and other diseases of the circulatory system: Secondary | ICD-10-CM

## 2019-10-06 DIAGNOSIS — I214 Non-ST elevation (NSTEMI) myocardial infarction: Principal | ICD-10-CM | POA: Diagnosis present

## 2019-10-06 DIAGNOSIS — Z7982 Long term (current) use of aspirin: Secondary | ICD-10-CM

## 2019-10-06 DIAGNOSIS — G4730 Sleep apnea, unspecified: Secondary | ICD-10-CM | POA: Diagnosis present

## 2019-10-06 DIAGNOSIS — I2510 Atherosclerotic heart disease of native coronary artery without angina pectoris: Secondary | ICD-10-CM | POA: Diagnosis present

## 2019-10-06 DIAGNOSIS — Z20822 Contact with and (suspected) exposure to covid-19: Secondary | ICD-10-CM | POA: Diagnosis present

## 2019-10-06 DIAGNOSIS — Z888 Allergy status to other drugs, medicaments and biological substances status: Secondary | ICD-10-CM

## 2019-10-06 DIAGNOSIS — E785 Hyperlipidemia, unspecified: Secondary | ICD-10-CM | POA: Diagnosis present

## 2019-10-06 DIAGNOSIS — E669 Obesity, unspecified: Secondary | ICD-10-CM | POA: Diagnosis present

## 2019-10-06 DIAGNOSIS — E1165 Type 2 diabetes mellitus with hyperglycemia: Secondary | ICD-10-CM | POA: Diagnosis present

## 2019-10-06 DIAGNOSIS — I110 Hypertensive heart disease with heart failure: Secondary | ICD-10-CM | POA: Diagnosis present

## 2019-10-06 DIAGNOSIS — Z91041 Radiographic dye allergy status: Secondary | ICD-10-CM

## 2019-10-06 DIAGNOSIS — M545 Low back pain: Secondary | ICD-10-CM | POA: Diagnosis present

## 2019-10-06 DIAGNOSIS — Z6834 Body mass index (BMI) 34.0-34.9, adult: Secondary | ICD-10-CM

## 2019-10-06 HISTORY — DX: Unspecified osteoarthritis, unspecified site: M19.90

## 2019-10-06 HISTORY — DX: Sleep apnea, unspecified: G47.30

## 2019-10-06 HISTORY — DX: Dyspnea, unspecified: R06.00

## 2019-10-06 HISTORY — DX: Angina pectoris, unspecified: I20.9

## 2019-10-06 LAB — BASIC METABOLIC PANEL
Anion gap: 13 (ref 5–15)
BUN: 11 mg/dL (ref 8–23)
CO2: 21 mmol/L — ABNORMAL LOW (ref 22–32)
Calcium: 9.4 mg/dL (ref 8.9–10.3)
Chloride: 105 mmol/L (ref 98–111)
Creatinine, Ser: 0.86 mg/dL (ref 0.61–1.24)
GFR calc Af Amer: >60 mL/min (ref >60)
GFR calc non Af Amer: >60 mL/min (ref >60)
Glucose, Bld: 296 mg/dL — ABNORMAL HIGH (ref 70–99)
Potassium: 3.5 mmol/L (ref 3.5–5.1)
Sodium: 139 mmol/L (ref 135–145)

## 2019-10-06 LAB — CBC
HCT: 45.4 % (ref 39.0–52.0)
Hemoglobin: 16.6 g/dL (ref 13.0–17.0)
MCH: 30.3 pg (ref 26.0–34.0)
MCHC: 36.6 g/dL — ABNORMAL HIGH (ref 30.0–36.0)
MCV: 83 fL (ref 80.0–100.0)
Platelets: 238 10*3/uL (ref 150–400)
RBC: 5.47 MIL/uL (ref 4.22–5.81)
RDW: 14.6 % (ref 11.5–15.5)
WBC: 11.8 10*3/uL — ABNORMAL HIGH (ref 4.0–10.5)
nRBC: 0 % (ref 0.0–0.2)

## 2019-10-06 LAB — TROPONIN I (HIGH SENSITIVITY)
Troponin I (High Sensitivity): 36 ng/L — ABNORMAL HIGH (ref <18)
Troponin I (High Sensitivity): 127 ng/L (ref <18)

## 2019-10-06 LAB — PROTIME-INR
INR: 1.1 (ref 0.8–1.2)
Prothrombin Time: 13.9 seconds (ref 11.4–15.2)

## 2019-10-06 LAB — APTT: aPTT: 28 seconds (ref 24–36)

## 2019-10-06 MED ORDER — FENTANYL CITRATE (PF) 100 MCG/2ML IJ SOLN
25.0000 ug | INTRAMUSCULAR | Status: DC | PRN
  Administered 2019-10-07: 25 ug via INTRAVENOUS
  Filled 2019-10-06: qty 2

## 2019-10-06 MED ORDER — HEPARIN (PORCINE) 25000 UT/250ML-% IV SOLN
1500.0000 [IU]/h | INTRAVENOUS | Status: DC
  Administered 2019-10-06: 1150 [IU]/h via INTRAVENOUS
  Administered 2019-10-07: 1350 [IU]/h via INTRAVENOUS
  Filled 2019-10-06 (×2): qty 250

## 2019-10-06 MED ORDER — FENTANYL CITRATE (PF) 100 MCG/2ML IJ SOLN
100.0000 ug | Freq: Once | INTRAMUSCULAR | Status: AC
  Administered 2019-10-06: 23:00:00 100 ug via INTRAVENOUS
  Filled 2019-10-06: qty 2

## 2019-10-06 MED ORDER — FENTANYL CITRATE (PF) 100 MCG/2ML IJ SOLN
50.0000 ug | Freq: Once | INTRAMUSCULAR | Status: AC
  Administered 2019-10-06: 21:00:00 50 ug via INTRAVENOUS
  Filled 2019-10-06: qty 2

## 2019-10-06 MED ORDER — ONDANSETRON HCL 4 MG/2ML IJ SOLN: 4.0000 mg | Freq: Four times a day (QID) | INTRAMUSCULAR | Status: DC | PRN

## 2019-10-06 MED ORDER — CARVEDILOL 6.25 MG PO TABS
6.2500 mg | ORAL_TABLET | Freq: Two times a day (BID) | ORAL | Status: DC
  Administered 2019-10-07 – 2019-10-10 (×6): 6.25 mg via ORAL
  Filled 2019-10-06 (×8): qty 1

## 2019-10-06 MED ORDER — ASPIRIN 81 MG PO CHEW
324.0000 mg | CHEWABLE_TABLET | Freq: Once | ORAL | Status: AC
  Administered 2019-10-06: 324 mg via ORAL
  Filled 2019-10-06: qty 4

## 2019-10-06 MED ORDER — ALPRAZOLAM 0.25 MG PO TABS
0.2500 mg | ORAL_TABLET | Freq: Two times a day (BID) | ORAL | Status: DC | PRN
  Administered 2019-10-07 – 2019-10-09 (×3): 0.25 mg via ORAL
  Filled 2019-10-06 (×3): qty 1

## 2019-10-06 MED ORDER — ONDANSETRON HCL 4 MG/2ML IJ SOLN
4.0000 mg | Freq: Once | INTRAMUSCULAR | Status: AC
  Administered 2019-10-06: 4 mg via INTRAVENOUS
  Filled 2019-10-06: qty 2

## 2019-10-06 MED ORDER — ASPIRIN EC 81 MG PO TBEC
81.0000 mg | DELAYED_RELEASE_TABLET | Freq: Every day | ORAL | Status: DC
  Administered 2019-10-07 – 2019-10-11 (×4): 81 mg via ORAL
  Filled 2019-10-06 (×6): qty 1

## 2019-10-06 MED ORDER — NITROGLYCERIN 0.4 MG SL SUBL
0.4000 mg | SUBLINGUAL_TABLET | SUBLINGUAL | Status: DC | PRN
  Administered 2019-10-06: 0.4 mg via SUBLINGUAL
  Filled 2019-10-06: qty 1

## 2019-10-06 MED ORDER — HEPARIN BOLUS VIA INFUSION
4000.0000 [IU] | Freq: Once | INTRAVENOUS | Status: AC
  Administered 2019-10-06: 4000 [IU] via INTRAVENOUS
  Filled 2019-10-06: qty 4000

## 2019-10-06 MED ORDER — ACETAMINOPHEN 325 MG PO TABS
650.0000 mg | ORAL_TABLET | ORAL | Status: DC | PRN
  Administered 2019-10-07 (×2): 650 mg via ORAL
  Filled 2019-10-06 (×2): qty 2

## 2019-10-06 MED ORDER — NITROGLYCERIN 0.4 MG SL SUBL: 0.4000 mg | SUBLINGUAL_TABLET | SUBLINGUAL | Status: DC | PRN

## 2019-10-06 MED ORDER — SODIUM CHLORIDE 0.9% FLUSH
3.0000 mL | Freq: Once | INTRAVENOUS | Status: AC
  Administered 2019-10-07: 3 mL via INTRAVENOUS

## 2019-10-06 MED ORDER — NITROGLYCERIN 2 % TD OINT
1.0000 [in_us] | TOPICAL_OINTMENT | Freq: Four times a day (QID) | TRANSDERMAL | Status: DC
  Administered 2019-10-06 – 2019-10-07 (×3): 1 [in_us] via TOPICAL
  Filled 2019-10-06 (×3): qty 1

## 2019-10-06 NOTE — ED Provider Notes (Signed)
[facility] Emergency Department Provider Note  ____________________________________________  Time seen: Approximately 7:17 PM  I have reviewed the triage vital signs and the nursing notes.   HISTORY  Chief Complaint Chest Pain    HPI [name]. is a 64 y.o. male who presents the emergency department complaining of substernal chest pain, throat tightness, left arm pain.  Patient states that today while at work he felt "weird."  Patient states that he began to have facial tightness, pressure in his throat, severe headache.  This increased to include chest pain, left arm pain.  Patient states that initially when the pain presented he broke out into a cold sweat.  Patient denies any palpitations.  Patient states that he does have slight blurred vision at this time.  No weakness on one side of the body or the other.  No difficulty formulating thoughts or words.  Patient with a significant cardiac history.  He states that his previous heart attacks have presented similarly.  No medications for this prior to arrival.  Patient has a history of coronary artery disease with multiple MIs.  Patient with a history of hypertension, cardiomyopathy, CHF.         Past Medical History:  Diagnosis Date  . CAD (coronary artery disease)    a. s/p MI with LAD and Diag stenting @ [name];  b. 06/2015 Cath: LAD 84m/d ISR, 100 RCA (ISR) w/ L->R collats, otw mod nonobs dzs-->Med Rx; c. 08/2019 Cath: LM nl, LAD 10p/m ISR, D1 20, D2 100, RI min irregs, LCX 60m/d, OM1 100, OM2 50, RCA 100p, 70d. RPDA fills via collats from LAD. EF 25-35%-->Med Rx.  . Chronic combined systolic and diastolic CHF (congestive heart failure) (HCC)    a. 06/2015 Echo: EF 20-25%, Gr3 DD; b. 04/2019 Echo: EF 40-45% w/ inf, infsept, ap ant, mid ant HK. Gr1 DD. Mild MR. Nl RV fxn.  . Essential hypertension   . Hyperlipidemia   . Hypokalemia    a. 06/2015 in setting of diuresis.  . Ischemic cardiomyopathy    a.  2011 EF 45% ([name]);  b. 06/2015 Echo: EF 20-25%; c. 04/2019 Echo: EF 40-45%; d. 08/2019 LV gram: EF 25-35%.    Patient Active Problem List   Diagnosis Date Noted  . Chronic systolic heart failure (HCC)   . Ischemic cardiomyopathy   . Acute systolic CHF (congestive heart failure) (HCC) 06/10/2015  . Unstable angina (HCC) 06/10/2015  . CAD (coronary artery disease) 06/10/2015  . Acute on chronic systolic (congestive) heart failure (HCC) 06/10/2015  . Essential hypertension   . Hyperlipidemia   . CHF (congestive heart failure) (HCC) 06/07/2015    Past Surgical History:  Procedure Laterality Date  . BACK SURGERY  04/2019  . CARDIAC CATHETERIZATION N/A 06/10/2015   Procedure: Left Heart Cath;  Surgeon: Iran Ouch, MD;  Location: [facility] INVASIVE CV LAB;  Service: Cardiovascular;  Laterality: N/A;  . CORONARY STENT PLACEMENT    . RIGHT/LEFT HEART CATH AND CORONARY ANGIOGRAPHY N/A 08/20/2019   Procedure: RIGHT/LEFT HEART CATH AND CORONARY ANGIOGRAPHY;  Surgeon: Iran Ouch, MD;  Location: [facility] INVASIVE CV LAB;  Service: Cardiovascular;  Laterality: N/A;    Prior to Admission medications   Medication Sig Start Date End Date Taking? Authorizing Provider  acetaminophen (TYLENOL) 500 MG tablet Take 1,000 mg by mouth at bedtime.    [provider]  aspirin 81 MG chewable tablet Chew 1 tablet (81 mg total) by mouth daily. 06/11/15   Allena Katz,  Shreyang, MD  carvedilol (COREG) 6.25 MG tablet Take 1 tablet (6.25 mg total) by mouth 2 (two) times daily. 10/01/19 12/30/19  Theora Gianotti, NP  Clotrimazole [facility] RELIEF EX) Apply 1 application topically daily as needed (jock itch).    [provider]  colchicine 0.6 MG tablet Take 1 tablet (0.6 mg total) by mouth daily as needed (gout). 09/13/19   Theora Gianotti, NP  ezetimibe (ZETIA) 10 MG tablet Take 1 tablet (10 mg total) by mouth daily. 09/13/19 12/12/19  Theora Gianotti, NP  furosemide (LASIX) 20  MG tablet Take 1 tablet (20 mg total) by mouth daily. 08/16/19 08/15/20  Alisa Graff, FNP  sacubitril-valsartan (ENTRESTO) 49-51 MG Take 1 tablet by mouth 2 (two) times daily.    [provider]  spironolactone (ALDACTONE) 25 MG tablet Take 1 tablet (25 mg total) by mouth daily. 09/21/19 12/20/19  Theora Gianotti, NP    Allergies Contrast media [iodinated diagnostic agents], Iohexol, Atorvastatin, Hydrocodone, Morphine and related, Penicillins, and Rosuvastatin  Family History  Problem Relation Age of Onset  . Coronary artery disease Mother   . Diabetes Mother   . Coronary artery disease Father     Social History Social History   Tobacco Use  . Smoking status: Former Smoker    Quit date: 06/10/2011    Years since quitting: 8.3  . Smokeless tobacco: Never Used  Substance Use Topics  . Alcohol use: No    Alcohol/week: 0.0 standard drinks  . Drug use: No     Review of Systems  Constitutional: No fever/chills Eyes: No visual changes. No discharge ENT: No upper respiratory complaints. Cardiovascular: Positive for chest pain with radiation into the neck and left arm Respiratory: no cough. No SOB. Gastrointestinal: No abdominal pain.  No nausea, no vomiting.  No diarrhea.  No constipation. Musculoskeletal: Negative for musculoskeletal pain. Skin: Negative for rash, abrasions, lacerations, ecchymosis. Neurological: Negative for headaches, focal weakness or numbness. 10-point ROS otherwise negative.  ____________________________________________   PHYSICAL EXAM:  VITAL SIGNS: ED Triage Vitals  Enc Vitals Group     BP 10/06/19 1832 (!) 206/85     Pulse Rate 10/06/19 1853 78     Resp 10/06/19 1832 18     Temp 10/06/19 1832 97.7 F (36.5 C)     Temp src --      SpO2 10/06/19 1832 95 %     Weight 10/06/19 1833 217 lb (98.4 kg)     Height --      Head Circumference --      Peak Flow --      Pain Score 10/06/19 1833 10     Pain Loc --      Pain Edu? --       Excl. in [place]? --      Constitutional: Alert and oriented. Well appearing and in no acute distress. Eyes: Conjunctivae are normal. PERRL. EOMI. Head: Atraumatic. ENT:      Ears:       Nose: No congestion/rhinnorhea.      Mouth/Throat: Mucous membranes are moist.  Neck: No stridor.  No cervical spine tenderness to palpation.  No visualized erythema or edema of the anterior neck.  Nontender to palpation. Hematological/Lymphatic/Immunilogical: No cervical lymphadenopathy. Cardiovascular: Normal rate, regular rhythm. Normal S1 and S2.  Good peripheral circulation. Respiratory: Normal respiratory effort without tachypnea or retractions. Lungs CTAB. Good air entry to the bases with no decreased or absent breath sounds. Musculoskeletal: Full range of motion to  all extremities. No gross deformities appreciated. Neurologic:  Normal speech and language. No gross focal neurologic deficits are appreciated.  Cranial nerves II through XII grossly intact.  Negative Romberg's and pronator drift Skin:  Skin is warm, dry and intact. No rash noted. Psychiatric: Mood and affect are normal. Speech and behavior are normal. Patient exhibits appropriate insight and judgement.   ____________________________________________   LABS (all labs ordered are listed, but only abnormal results are displayed)  Labs Reviewed  BASIC METABOLIC PANEL - Abnormal; Notable for the following components:      Result Value   CO2 21 (*)    Glucose, Bld 296 (*)    All other components within normal limits  CBC - Abnormal; Notable for the following components:   WBC 11.8 (*)    MCHC 36.6 (*)    All other components within normal limits  TROPONIN I (HIGH SENSITIVITY) - Abnormal; Notable for the following components:   Troponin I (High Sensitivity) 36 (*)    All other components within normal limits  TROPONIN I (HIGH SENSITIVITY)    ____________________________________________  EKG   ____________________________________________  RADIOLOGY I personally viewed and evaluated these images as part of my medical decision making, as well as reviewing the written report by the radiologist.  DG Chest 2 View  Result Date: 10/06/2019 CLINICAL DATA:  Chest pain EXAM: CHEST - 2 VIEW COMPARISON:  2016 FINDINGS: The heart size and mediastinal contours are within normal limits. Both lungs are clear. No pleural effusion or pneumothorax. No acute osseous abnormality. IMPRESSION: No acute process in the chest. Electronically Signed   By: Guadlupe Spanish [name]   On: 10/06/2019 19:17   CT Head Wo Contrast  Result Date: 10/06/2019 CLINICAL DATA:  Acute headache. Severe headache with facial tightness, pain/numbness in left arm. EXAM: CT HEAD WITHOUT CONTRAST TECHNIQUE: Contiguous axial images were obtained from the base of the skull through the vertex without intravenous contrast. COMPARISON:  None. FINDINGS: Brain: Generalized cerebral atrophy. Moderate periventricular and deep white matter hypodensity most consistent with chronic small vessel ischemia. No evidence of acute infarction, hemorrhage, hydrocephalus, extra-axial collection or mass lesion/mass effect. Vascular: No hyperdense vessel. Skull: No fracture or focal lesion. Sinuses/Orbits: Mucosal thickening of left side of sphenoid sinus with mucous retention cyst. Mucosal thickening of left maxillary sinus with heterogeneous debris. Mild ethmoid sinus mucosal thickening on the left. Prior right cataract resection. Mastoid air cells are clear. Other: None. IMPRESSION: 1. Generalized atrophy and chronic small vessel ischemia. No acute intracranial abnormality. 2. Left paranasal sinus disease. Electronically Signed   By: Narda Rutherford [name]   On: 10/06/2019 19:59    ____________________________________________    PROCEDURES  Procedure(s) performed:    Procedures    Medications   sodium chloride flush (NS) 0.9 % injection 3 mL (has no administration in time range)  ondansetron (ZOFRAN) injection 4 mg (4 mg Intravenous Given 10/06/19 2059)  fentaNYL (SUBLIMAZE) injection 50 mcg (50 mcg Intravenous Given 10/06/19 2058)     ____________________________________________   INITIAL IMPRESSION / ASSESSMENT AND PLAN / ED COURSE  Pertinent labs & imaging results that were available during my care of the patient were reviewed by me and considered in my medical decision making (see chart for details).  Review of the [place] CSRS was performed in accordance of the [facility] prior to dispensing any controlled drugs.  Clinical Course as of Oct 06 2102  Sat Oct 06, 2019  1929 Patient presented to the emergency department complaining of severe headache,  chest pain with radiation into the neck and left arm.  Patient states that he has a significant cardiac history, has had a history of MIs in the past.  Patient states that it was slightly atypical today as the pain started with a severe headache, facial tightness and progressed to include chest pain and left arm pain.  No history of CVA.  On exam, patient with reassuring neuro exam.  No significant acute findings.  This time patient will have labs, EKG, chest x-ray and CT of the head.   [JC]    Clinical Course User Index [JC] Aundreya Souffrant, Delorise Royals, PA-C        Patient presented to emergency department with significant headache, chest pain radiating into the left arm and into the neck.  Patient has a significant cardiac history.  He states that similar symptoms were present with his previous MIs.  Patient did not have headache however with his previous MIs.  Overall exam is reassuring.  CT head, chest x-ray, EKG, troponin, basic labs were ordered.  Initial troponin is 36 which is not unexpected given patient history of CHF, coronary artery disease.  Awaiting second troponin at this time.  EKG is reassuring.  Labs are reassuring.  Imaging is  reassuring.  At this time, ending final troponin, patient care will be turned over to attending provider, Dr. Derrill Kay for final diagnosis and disposition.     This chart was dictated using voice recognition software/Dragon. Despite best efforts to proofread, errors can occur which can change the meaning. Any change was purely unintentional.    Racheal Patches, PA-C 10/06/19 2104    Phineas Semen, MD 10/06/19 2105

## 2019-10-06 NOTE — Consult Note (Signed)
ANTICOAGULATION CONSULT NOTE - Initial Consult  Pharmacy Consult for Heparin Infusion Indication: chest pain/ACS  Allergies  Allergen Reactions  . Contrast Media [Iodinated Diagnostic Agents] Shortness Of Breath  . Iohexol Shortness Of Breath     Onset Date: [id number]   . Atorvastatin     Myalgias   . Hydrocodone Itching  . Morphine And Related     Lost control   . Penicillins     Unknown reaction Did it involve swelling of the face/tongue/throat, SOB, or low BP? Unknown Did it involve sudden or severe rash/hives, skin peeling, or any reaction on the inside of your mouth or nose? Unknown Did you need to seek medical attention at a hospital or doctor's office? Yes When did it last happen?Childhood allergy  If all above answers are "NO", may proceed with cephalosporin use.   . Rosuvastatin     Myalgias     Patient Measurements: Weight: 98.4 kg (217 lb) Heparin Dosing Weight: 85.3 kg  Vital Signs: Temp: 97.7 F (36.5 C) (04/03 1832) BP: 165/90 (04/03 2041) Pulse Rate: 70 (04/03 2041)  Labs: Recent Labs    10/06/19 1835 10/06/19 2042  HGB 16.6  --   HCT 45.4  --   PLT 238  --   CREATININE 0.86  --   TROPONINIHS 36* 127*    Estimated Creatinine Clearance: 96.5 mL/min (by C-G formula based on SCr of 0.86 mg/dL).   Medical History: Past Medical History:  Diagnosis Date  . CAD (coronary artery disease)    a. s/p MI with LAD and Diag stenting @ [name];  b. 06/2015 Cath: LAD 58m/d ISR, 100 RCA (ISR) w/ L->R collats, otw mod nonobs dzs-->Med Rx; c. 08/2019 Cath: LM nl, LAD 10p/m ISR, D1 20, D2 100, RI min irregs, LCX 28m/d, OM1 100, OM2 50, RCA 100p, 70d. RPDA fills via collats from LAD. EF 25-35%-->Med Rx.  . Chronic combined systolic and diastolic CHF (congestive heart failure) (HCC)    a. 06/2015 Echo: EF 20-25%, Gr3 DD; b. 04/2019 Echo: EF 40-45% w/ inf, infsept, ap ant, mid ant HK. Gr1 DD. Mild MR. Nl RV fxn.  . Essential hypertension   . Hyperlipidemia   .  Hypokalemia    a. 06/2015 in setting of diuresis.  . Ischemic cardiomyopathy    a. 2011 EF 45% ([name]);  b. 06/2015 Echo: EF 20-25%; c. 04/2019 Echo: EF 40-45%; d. 08/2019 LV gram: EF 25-35%.    Medications:  (Not in a hospital admission)  Scheduled:  . aspirin  324 mg Oral Once  . heparin  4,000 Units Intravenous Once  . sodium chloride flush  3 mL Intravenous Once   Infusions:  . heparin     PRN: nitroGLYCERIN Anti-infectives (From admission, onward)   None      Assessment: Pharmacy has been consulted to initiate heparin infusion in 64yo presenting to the ED with substernal chest pain. Patient has a significant cardiac history, including multiple heart attacks that he states have presented similarly. Initial troponin level of 36, repeat level of 127.  Patient has no prior history of anticoagulant use PTA. Baseline labs have been ordered and are pending. Will initiate heparin infusion immediately.   Goal of Therapy:  Heparin level 0.3-0.7 units/ml Monitor platelets by anticoagulation protocol: Yes   Plan:  Give 4000 units bolus x 1 Start heparin infusion at 1150 units/hr Check anti-Xa level in 6 hours and daily while on heparin Continue to monitor H&H and platelets  Roniesha Hollingshead A Brittanee Ghazarian 10/06/2019,9:54 PM

## 2019-10-06 NOTE — ED Triage Notes (Signed)
Pt presents via POV c/o midsternal chest pain with left arm pain, nausea, and throat pain x3 hrs per pt. Reports hx MI.

## 2019-10-06 NOTE — H&P (Signed)
History and Physical    [name]. UMP:[id number] DOB: April 11, 1956 DOA: 10/06/2019  PCP: [name], MD   Patient coming from: home I have personally briefly reviewed patient's old medical records in [facility]  Chief Complaint: chest pain  HPI: [name]. is a 64 y.o. male with medical history significant for coronary artery disease status post MI x3 with recent cardiac cath in February 2021 showing patent stents and severe triple-vessel disease managed with aggressive medical management, as well as history of systolic heart failure EF 25 to 35% on recent cath, secondary to ischemic cardiomyopathy, hypertension and HLD who presents to the emergency room with onset of typical chest pain, retrosternal radiating to the back, throat and left arm of intensity 10 out of 10, similar and somewhat worse than his prior heart attack.  It was associated with diaphoresis and nausea.  He also has complaint of headache.  He had no cough, fever or chills  ED Course: On arrival in the emergency room blood pressure was 206/85 with otherwise normal vitals.  EKG was nonacute.  Troponin was 36>>127.  He was started on a heparin drip.  Nitropaste was added after discussion with Dr. Mariah Milling, secondary to continued pain 90 minutes after fentanyl.  Patient will be admitted to telemetry.  Head CT was negative. Review of Systems: As per HPI otherwise 10 point review of systems negative.    Past Medical History:  Diagnosis Date  . CAD (coronary artery disease)    a. s/p MI with LAD and Diag stenting @ [name];  b. 06/2015 Cath: LAD 56m/d ISR, 100 RCA (ISR) w/ L->R collats, otw mod nonobs dzs-->Med Rx; c. 08/2019 Cath: LM nl, LAD 10p/m ISR, D1 20, D2 100, RI min irregs, LCX 73m/d, OM1 100, OM2 50, RCA 100p, 70d. RPDA fills via collats from LAD. EF 25-35%-->Med Rx.  . Chronic combined systolic and diastolic CHF (congestive heart failure) (HCC)    a. 06/2015 Echo: EF 20-25%, Gr3 DD; b. 04/2019 Echo: EF 40-45%  w/ inf, infsept, ap ant, mid ant HK. Gr1 DD. Mild MR. Nl RV fxn.  . Essential hypertension   . Hyperlipidemia   . Hypokalemia    a. 06/2015 in setting of diuresis.  . Ischemic cardiomyopathy    a. 2011 EF 45% ([name]);  b. 06/2015 Echo: EF 20-25%; c. 04/2019 Echo: EF 40-45%; d. 08/2019 LV gram: EF 25-35%.    Past Surgical History:  Procedure Laterality Date  . BACK SURGERY  04/2019  . CARDIAC CATHETERIZATION N/A 06/10/2015   Procedure: Left Heart Cath;  Surgeon: Iran Ouch, MD;  Location: [facility] INVASIVE CV LAB;  Service: Cardiovascular;  Laterality: N/A;  . CORONARY STENT PLACEMENT    . RIGHT/LEFT HEART CATH AND CORONARY ANGIOGRAPHY N/A 08/20/2019   Procedure: RIGHT/LEFT HEART CATH AND CORONARY ANGIOGRAPHY;  Surgeon: Iran Ouch, MD;  Location: [facility] INVASIVE CV LAB;  Service: Cardiovascular;  Laterality: N/A;     reports that he quit smoking about 8 years ago. He has never used smokeless tobacco. He reports that he does not drink alcohol or use drugs.  Allergies  Allergen Reactions  . Contrast Media [Iodinated Diagnostic Agents] Shortness Of Breath  . Iohexol Shortness Of Breath     Onset Date: [id number]   . Atorvastatin     Myalgias   . Hydrocodone Itching  . Morphine And Related     Lost control   . Penicillins     Unknown reaction Did it  involve swelling of the face/tongue/throat, SOB, or low BP? Unknown Did it involve sudden or severe rash/hives, skin peeling, or any reaction on the inside of your mouth or nose? Unknown Did you need to seek medical attention at a hospital or doctor's office? Yes When did it last happen?Childhood allergy  If all above answers are "NO", may proceed with cephalosporin use.   . Rosuvastatin     Myalgias     Family History  Problem Relation Age of Onset  . Coronary artery disease Mother   . Diabetes Mother   . Coronary artery disease Father      Prior to Admission medications   Medication Sig Start Date End Date  Taking? Authorizing Provider  acetaminophen (TYLENOL) 500 MG tablet Take 1,000 mg by mouth at bedtime as needed. At bedtime and as needed.   Yes [provider]  aspirin 81 MG chewable tablet Chew 1 tablet (81 mg total) by mouth daily. 06/11/15  Yes Dustin Flock, MD  carvedilol (COREG) 6.25 MG tablet Take 1 tablet (6.25 mg total) by mouth 2 (two) times daily. 10/01/19 12/30/19 Yes Theora Gianotti, NP  Clotrimazole [facility] RELIEF EX) Apply 1 application topically daily as needed (jock itch).   Yes [provider]  colchicine 0.6 MG tablet Take 1 tablet (0.6 mg total) by mouth daily as needed (gout). 09/13/19  Yes Theora Gianotti, NP  furosemide (LASIX) 20 MG tablet Take 1 tablet (20 mg total) by mouth daily. 08/16/19 08/15/20 Yes Hackney, Aura Fey, FNP  lip balm (BLISTEX) OINT Apply 1 application topically as needed for lip care.   Yes [provider]  sacubitril-valsartan (ENTRESTO) 49-51 MG Take 1 tablet by mouth 2 (two) times daily.   Yes [provider]  ezetimibe (ZETIA) 10 MG tablet Take 1 tablet (10 mg total) by mouth daily. Patient not taking: Reported on 10/06/2019 09/13/19 12/12/19  Theora Gianotti, NP  spironolactone (ALDACTONE) 25 MG tablet Take 1 tablet (25 mg total) by mouth daily. Patient not taking: Reported on 10/06/2019 09/21/19 12/20/19  Theora Gianotti, NP    Physical Exam: Vitals:   10/06/19 1921 10/06/19 2041 10/06/19 2130 10/06/19 2200  BP:  (!) 165/90 (!) 148/80 (!) 168/83  Pulse: 78 70 68 69  Resp: (!) 25 (!) 31 19 20   Temp:      SpO2: 97% 96% (!) 88% 98%  Weight:         Vitals:   10/06/19 1921 10/06/19 2041 10/06/19 2130 10/06/19 2200  BP:  (!) 165/90 (!) 148/80 (!) 168/83  Pulse: 78 70 68 69  Resp: (!) 25 (!) 31 19 20   Temp:      SpO2: 97% 96% (!) 88% 98%  Weight:        Constitutional: Alert and awake, oriented x3, not in some pain discomfort. Eyes: PERLA, EOMI, irises appear normal,  anicteric sclera,  ENMT: external ears and nose appear normal, normal hearing             Lips appears normal, oropharynx mucosa, tongue, posterior pharynx appear normal  Neck: neck appears normal, no masses, normal ROM, no thyromegaly, no JVD  CVS: S1-S2 clear, no murmur rubs or gallops,  , no carotid bruits, pedal pulses palpable, No LE edema Respiratory:  clear to auscultation bilaterally, no wheezing, rales or rhonchi. Respiratory effort normal. No accessory muscle use.  Abdomen: soft nontender, nondistended, normal bowel sounds, no hepatosplenomegaly, no hernias Musculoskeletal: : no cyanosis, clubbing , no contractures or  atrophy Neuro: Cranial nerves II-XII intact, sensation, reflexes normal, strength Psych: judgement and insight appear normal, stable mood and affect,  Skin: no rashes or lesions or ulcers, no induration or nodules   Labs on Admission: I have personally reviewed following labs and imaging studies  CBC: Recent Labs  Lab 10/06/19 1835  WBC 11.8*  HGB 16.6  HCT 45.4  MCV 83.0  PLT 238   Basic Metabolic Panel: Recent Labs  Lab 10/06/19 1835  NA 139  K 3.5  CL 105  CO2 21*  GLUCOSE 296*  BUN 11  CREATININE 0.86  CALCIUM 9.4   GFR: Estimated Creatinine Clearance: 96.5 mL/min (by C-G formula based on SCr of 0.86 mg/dL). Liver Function Tests: No results for input(s): AST, ALT, ALKPHOS, BILITOT, PROT, ALBUMIN in the last 168 hours. No results for input(s): LIPASE, AMYLASE in the last 168 hours. No results for input(s): AMMONIA in the last 168 hours. Coagulation Profile: Recent Labs  Lab 10/06/19 1836  INR 1.1   Cardiac Enzymes: No results for input(s): CKTOTAL, CKMB, CKMBINDEX, TROPONINI in the last 168 hours. BNP (last 3 results) No results for input(s): PROBNP in the last 8760 hours. HbA1C: No results for input(s): HGBA1C in the last 72 hours. CBG: No results for input(s): GLUCAP in the last 168 hours. Lipid Profile: No results for input(s):  CHOL, HDL, LDLCALC, TRIG, CHOLHDL, LDLDIRECT in the last 72 hours. Thyroid Function Tests: No results for input(s): TSH, T4TOTAL, FREET4, T3FREE, THYROIDAB in the last 72 hours. Anemia Panel: No results for input(s): VITAMINB12, FOLATE, FERRITIN, TIBC, IRON, RETICCTPCT in the last 72 hours. Urine analysis: No results found for: COLORURINE, APPEARANCEUR, LABSPEC, PHURINE, GLUCOSEU, HGBUR, BILIRUBINUR, KETONESUR, PROTEINUR, UROBILINOGEN, NITRITE, LEUKOCYTESUR  Radiological Exams on Admission: DG Chest 2 View  Result Date: 10/06/2019 CLINICAL DATA:  Chest pain EXAM: CHEST - 2 VIEW COMPARISON:  2016 FINDINGS: The heart size and mediastinal contours are within normal limits. Both lungs are clear. No pleural effusion or pneumothorax. No acute osseous abnormality. IMPRESSION: No acute process in the chest. Electronically Signed   By: Guadlupe Spanish [name]   On: 10/06/2019 19:17   CT Head Wo Contrast  Result Date: 10/06/2019 CLINICAL DATA:  Acute headache. Severe headache with facial tightness, pain/numbness in left arm. EXAM: CT HEAD WITHOUT CONTRAST TECHNIQUE: Contiguous axial images were obtained from the base of the skull through the vertex without intravenous contrast. COMPARISON:  None. FINDINGS: Brain: Generalized cerebral atrophy. Moderate periventricular and deep white matter hypodensity most consistent with chronic small vessel ischemia. No evidence of acute infarction, hemorrhage, hydrocephalus, extra-axial collection or mass lesion/mass effect. Vascular: No hyperdense vessel. Skull: No fracture or focal lesion. Sinuses/Orbits: Mucosal thickening of left side of sphenoid sinus with mucous retention cyst. Mucosal thickening of left maxillary sinus with heterogeneous debris. Mild ethmoid sinus mucosal thickening on the left. Prior right cataract resection. Mastoid air cells are clear. Other: None. IMPRESSION: 1. Generalized atrophy and chronic small vessel ischemia. No acute intracranial abnormality. 2.  Left paranasal sinus disease. Electronically Signed   By: Narda Rutherford [name]   On: 10/06/2019 19:59    EKG: Independently reviewed.   Assessment/Plan    Unstable angina (HCC)   CAD (coronary artery disease) s/p MI x3, hx of stents --Patient had a cath in February 2021 by Dr. Kirke Corin that showed patent stents and severe triple-vessel disease being managed medically -Continue to trend troponins 36>>127 -Repeat EKGs not showing definite acute ST-T wave changes -Continue heparin infusion -Fentanyl as needed pain,  as patient allergic to morphine, aspirin, Coreg, Nitropaste for additional pain control,.  Patient intolerant to statins -Cardiology consult.  Spoke with Dr. Mariah Milling    Essential hypertension -Continue carvedilol    Hyperlipidemia -Continue Zetia    Chronic systolic heart failure (HCC)    Ischemic cardiomyopathy -Continue Lasix, Entresto and Aldactone and Coreg        DVT prophylaxis: On full dose heparin Code Status: full code  Family Communication:  none  Disposition Plan: Back to previous home environment Consults called: Dr Mariah Milling  Status:inp    Andris Baumann MD [facility]     10/06/2019, 10:53 PM

## 2019-10-06 NOTE — ED Notes (Signed)
Daughter Misty called, this RN provided update with pt's permission.  [phone]

## 2019-10-07 ENCOUNTER — Inpatient Hospital Stay (HOSPITAL_COMMUNITY)
Admit: 2019-10-07 | Discharge: 2019-10-07 | Disposition: A | Discharge status: Home / Self Care | Payer: Self-pay | Attending: Internal Medicine | Admitting: Internal Medicine

## 2019-10-07 ENCOUNTER — Encounter: Payer: Self-pay | Admitting: Internal Medicine

## 2019-10-07 DIAGNOSIS — R072 Precordial pain: Secondary | ICD-10-CM

## 2019-10-07 DIAGNOSIS — I10 Essential (primary) hypertension: Secondary | ICD-10-CM

## 2019-10-07 DIAGNOSIS — E782 Mixed hyperlipidemia: Secondary | ICD-10-CM

## 2019-10-07 DIAGNOSIS — I255 Ischemic cardiomyopathy: Secondary | ICD-10-CM

## 2019-10-07 DIAGNOSIS — I25110 Atherosclerotic heart disease of native coronary artery with unstable angina pectoris: Secondary | ICD-10-CM

## 2019-10-07 DIAGNOSIS — I5022 Chronic systolic (congestive) heart failure: Secondary | ICD-10-CM

## 2019-10-07 DIAGNOSIS — I2510 Atherosclerotic heart disease of native coronary artery without angina pectoris: Secondary | ICD-10-CM

## 2019-10-07 LAB — BASIC METABOLIC PANEL
Anion gap: 11 (ref 5–15)
BUN: 11 mg/dL (ref 8–23)
CO2: 23 mmol/L (ref 22–32)
Calcium: 9 mg/dL (ref 8.9–10.3)
Chloride: 105 mmol/L (ref 98–111)
Creatinine, Ser: 0.84 mg/dL (ref 0.61–1.24)
GFR calc Af Amer: >60 mL/min (ref >60)
GFR calc non Af Amer: >60 mL/min (ref >60)
Glucose, Bld: 194 mg/dL — ABNORMAL HIGH (ref 70–99)
Potassium: 3.6 mmol/L (ref 3.5–5.1)
Sodium: 139 mmol/L (ref 135–145)

## 2019-10-07 LAB — CBC
HCT: 42.4 % (ref 39.0–52.0)
Hemoglobin: 15.2 g/dL (ref 13.0–17.0)
MCH: 30.5 pg (ref 26.0–34.0)
MCHC: 35.8 g/dL (ref 30.0–36.0)
MCV: 85.1 fL (ref 80.0–100.0)
Platelets: 212 10*3/uL (ref 150–400)
RBC: 4.98 MIL/uL (ref 4.22–5.81)
RDW: 15.1 % (ref 11.5–15.5)
WBC: 9.1 10*3/uL (ref 4.0–10.5)
nRBC: 0 % (ref 0.0–0.2)

## 2019-10-07 LAB — GLUCOSE, CAPILLARY
Glucose-Capillary: 127 mg/dL — ABNORMAL HIGH (ref 70–99)
Glucose-Capillary: 156 mg/dL — ABNORMAL HIGH (ref 70–99)
Glucose-Capillary: 162 mg/dL — ABNORMAL HIGH (ref 70–99)
Glucose-Capillary: 190 mg/dL — ABNORMAL HIGH (ref 70–99)
Glucose-Capillary: 289 mg/dL — ABNORMAL HIGH (ref 70–99)

## 2019-10-07 LAB — HIV ANTIBODY (ROUTINE TESTING W REFLEX): HIV Screen 4th Generation wRfx: NONREACTIVE

## 2019-10-07 LAB — ECHOCARDIOGRAM COMPLETE
Height: 66 in
Weight: 3380.8 oz

## 2019-10-07 LAB — HEPARIN LEVEL (UNFRACTIONATED)
Heparin Unfractionated: 0.2 IU/mL — ABNORMAL LOW (ref 0.30–0.70)
Heparin Unfractionated: 0.39 IU/mL (ref 0.30–0.70)
Heparin Unfractionated: 0.29 IU/mL — ABNORMAL LOW (ref 0.30–0.70)

## 2019-10-07 LAB — LIPID PANEL
Cholesterol: 139 mg/dL (ref 0–200)
HDL: 42 mg/dL (ref >40)
LDL Cholesterol: 71 mg/dL (ref 0–99)
Total CHOL/HDL Ratio: 3.3 RATIO
Triglycerides: 132 mg/dL (ref <150)
VLDL: 26 mg/dL (ref 0–40)

## 2019-10-07 LAB — RESPIRATORY PANEL BY RT PCR (FLU A&B, COVID)
Influenza A by PCR: NEGATIVE
Influenza B by PCR: NEGATIVE
SARS Coronavirus 2 by RT PCR: NEGATIVE

## 2019-10-07 LAB — HEMOGLOBIN A1C
Hgb A1c MFr Bld: 7.1 % — ABNORMAL HIGH (ref 4.8–5.6)
Mean Plasma Glucose: 157.07 mg/dL

## 2019-10-07 LAB — TROPONIN I (HIGH SENSITIVITY): Troponin I (High Sensitivity): 10564 ng/L (ref <18)

## 2019-10-07 MED ORDER — SODIUM CHLORIDE 0.9 % WEIGHT BASED INFUSION: 3.0000 mL/kg/h | INTRAVENOUS | Status: AC

## 2019-10-07 MED ORDER — SODIUM CHLORIDE 0.9 % WEIGHT BASED INFUSION
1.0000 mL/kg/h | INTRAVENOUS | Status: DC
  Administered 2019-10-08: 1 mL/kg/h via INTRAVENOUS

## 2019-10-07 MED ORDER — FENTANYL CITRATE (PF) 100 MCG/2ML IJ SOLN
50.0000 ug | INTRAMUSCULAR | Status: DC | PRN
  Administered 2019-10-07: 50 ug via INTRAVENOUS
  Filled 2019-10-07 (×2): qty 2

## 2019-10-07 MED ORDER — TRAMADOL HCL 50 MG PO TABS
50.0000 mg | ORAL_TABLET | Freq: Four times a day (QID) | ORAL | Status: DC | PRN
  Administered 2019-10-07 – 2019-10-10 (×6): 50 mg via ORAL
  Filled 2019-10-07 (×6): qty 1

## 2019-10-07 MED ORDER — HYDROCORTISONE 1 % EX OINT
TOPICAL_OINTMENT | Freq: Four times a day (QID) | CUTANEOUS | Status: DC | PRN
  Filled 2019-10-07: qty 28.35

## 2019-10-07 MED ORDER — PERFLUTREN LIPID MICROSPHERE
1.0000 mL | INTRAVENOUS | Status: AC | PRN
  Administered 2019-10-07: 7.5 mL via INTRAVENOUS
  Filled 2019-10-07: qty 10

## 2019-10-07 MED ORDER — ASPIRIN 81 MG PO CHEW
81.0000 mg | CHEWABLE_TABLET | ORAL | Status: AC
  Administered 2019-10-08: 81 mg via ORAL
  Filled 2019-10-07: qty 1

## 2019-10-07 MED ORDER — HYDROCORTISONE 1 % EX OINT
TOPICAL_OINTMENT | Freq: Four times a day (QID) | CUTANEOUS | Status: DC | PRN
  Filled 2019-10-07 (×2): qty 28.35

## 2019-10-07 MED ORDER — INSULIN ASPART 100 UNIT/ML ~~LOC~~ SOLN
0.0000 [IU] | Freq: Four times a day (QID) | SUBCUTANEOUS | Status: DC
  Administered 2019-10-07: 1 [IU] via SUBCUTANEOUS
  Administered 2019-10-07: 2 [IU] via SUBCUTANEOUS
  Administered 2019-10-07 – 2019-10-08 (×2): 5 [IU] via SUBCUTANEOUS
  Administered 2019-10-08: 2 [IU] via SUBCUTANEOUS
  Administered 2019-10-09 (×3): 5 [IU] via SUBCUTANEOUS
  Administered 2019-10-09: 7 [IU] via SUBCUTANEOUS
  Filled 2019-10-07 (×9): qty 1

## 2019-10-07 NOTE — Progress Notes (Signed)
*  PRELIMINARY RESULTS* Echocardiogram 2D Echocardiogram has been performed. Definity IV Contrast used on this study.  Garrel Ridgel Alfons Sulkowski 10/07/2019, 10:52 AM

## 2019-10-07 NOTE — Consult Note (Signed)
Cardiology Consultation:   Patient ID: [name]. MRN: [id number]; DOB: 05/21/1956  Admit date: 10/06/2019 Date of Consult: 10/07/2019  Primary Care Provider: [name], MD Primary Cardiologist: Lorine Bears, MD  Physician requesting consult: Dr. Para March Reason for consult: Unstable angina   Patient Profile:   [name]. is a 64 y.o. male with a hx of coronary artery disease status post prior LAD, diagonal, and RCA stenting with subsequent known occlusion of the RCA, ischemic cardiomyopathy, chronic combined systolic diastolic congestive heart failure with an EF of 25-35%, hypertension, and hyperlipidemia, diagnostic catheterization August 20, 2019 with details as below, presenting with chest pain/arm pain  History of Present Illness:   [name] reports having fleeting chest pain when he was last seen by Nicolasa Ducking September 13, 2019 Did not think anything of it as they were short, fleeting but sharp  Reports having 2 episodes of severe chest pain reminiscent of his anginal/heart attack pain in the past First episode for 5 days ago middle of last week lasting 5 to 6 hours chest pain radiating to left arm, went away without intervention  3 PM yesterday developed severe chest and left arm pain Reports pain was intense like his original heart attack Presented to the emergency room for further evaluation  In the ER continues to have severe left arm pain shoulder down to elbow area, also severe mediastinal chest pain Was started on heparin, Nitropaste, morphine Reports he continues to have discomfort this morning  Initial troponin 36 with repeat 127  Biggest complaint this morning is the headache presumably from the Nitropaste  Reports statin intolerance, not taking Lipitor, Crestor Started on Zetia 2 months ago, has not started this yet  Prior cardiac history reviewed status post remote myocardial infarction with previous LAD, diagonal, and RCA stenting at  [facility].    Cardiac catheterization 2016 showed patent LAD and diagonal stents but a chronically occluded RCA with left-to-right collaterals.    EF of 20 to 25% by echocardiogram in December 2016,   Echo in 04/2019 [facility]) showed EF of 40-45%.    clinic August 16 2019 with reports of intermittent rest and exertional chest discomfort as well as exertional dyspnea and progressive fatigue.     catheterization February 15 revealing relatively stable coronary anatomy with patent LAD and diagonal stents and known occlusion of the RCA with left-to-right collaterals.  Circumflex disease was worse since 2016 but was supplying a small area of myocardium.  EF by ventriculography was 25-35%.    Past Medical History:  Diagnosis Date  . Anginal pain (HCC)   . Arthritis   . CAD (coronary artery disease)    a. s/p MI with LAD and Diag stenting @ [name];  b. 06/2015 Cath: LAD 65m/d ISR, 100 RCA (ISR) w/ L->R collats, otw mod nonobs dzs-->Med Rx; c. 08/2019 Cath: LM nl, LAD 10p/m ISR, D1 20, D2 100, RI min irregs, LCX 73m/d, OM1 100, OM2 50, RCA 100p, 70d. RPDA fills via collats from LAD. EF 25-35%-->Med Rx.  . Chronic combined systolic and diastolic CHF (congestive heart failure) (HCC)    a. 06/2015 Echo: EF 20-25%, Gr3 DD; b. 04/2019 Echo: EF 40-45% w/ inf, infsept, ap ant, mid ant HK. Gr1 DD. Mild MR. Nl RV fxn.  [name] Dyspnea   . Essential hypertension   . Hyperlipidemia   . Hypokalemia    a. 06/2015 in setting of diuresis.  . Ischemic cardiomyopathy    a. 2011 EF 45% ([name]);  b. 06/2015 Echo: EF 20-25%; c. 04/2019 Echo: EF 40-45%; d. 08/2019 LV gram: EF 25-35%.  . Sleep apnea     Past Surgical History:  Procedure Laterality Date  . BACK SURGERY  04/2019  . CARDIAC CATHETERIZATION N/A 06/10/2015   Procedure: Left Heart Cath;  Surgeon: Muhammad A Arida, MD;  Location: [facility] INVASIVE CV LAB;  Service: Cardiovascular;  Laterality: N/A;  . CORONARY STENT PLACEMENT    . RIGHT/LEFT HEART CATH AND CORONARY  ANGIOGRAPHY N/A 08/20/2019   Procedure: RIGHT/LEFT HEART CATH AND CORONARY ANGIOGRAPHY;  Surgeon: Arida, Muhammad A, MD;  Location: [facility] INVASIVE CV LAB;  Service: Cardiovascular;  Laterality: N/A;     Home Medications:  Prior to Admission medications   Medication Sig Start Date End Date Taking? Authorizing Provider  acetaminophen (TYLENOL) 500 MG tablet Take 1,000 mg by mouth at bedtime as needed. At bedtime and as needed.   Yes [provider]  aspirin 81 MG chewable tablet Chew 1 tablet (81 mg total) by mouth daily. 06/11/15  Yes Patel, Shreyang, MD  carvedilol (COREG) 6.25 MG tablet Take 1 tablet (6.25 mg total) by mouth 2 (two) times daily. 10/01/19 12/30/19 Yes Berge, Christopher Ronald, NP  Clotrimazole (JOCK ITCH RELIEF EX) Apply 1 application topically daily as needed (jock itch).   Yes [provider]  colchicine 0.6 MG tablet Take 1 tablet (0.6 mg total) by mouth daily as needed (gout). 09/13/19  Yes Berge, Christopher Ronald, NP  furosemide (LASIX) 20 MG tablet Take 1 tablet (20 mg total) by mouth daily. 08/16/19 08/15/20 Yes Hackney, Tina A, FNP  lip balm (BLISTEX) OINT Apply 1 application topically as needed for lip care.   Yes [provider]  sacubitril-valsartan (ENTRESTO) 49-51 MG Take 1 tablet by mouth 2 (two) times daily.   Yes [provider]  ezetimibe (ZETIA) 10 MG tablet Take 1 tablet (10 mg total) by mouth daily. Patient not taking: Reported on 10/06/2019 09/13/19 12/12/19  Berge, Christopher Ronald, NP  spironolactone (ALDACTONE) 25 MG tablet Take 1 tablet (25 mg total) by mouth daily. Patient not taking: Reported on 10/06/2019 09/21/19 12/20/19  Berge, Christopher Ronald, NP    Inpatient Medications: Scheduled Meds: . aspirin EC  81 mg Oral Daily  . carvedilol  6.25 mg Oral BID WC  . nitroGLYCERIN  1 inch Topical Q6H  . sodium chloride flush  3 mL Intravenous Once   Continuous Infusions: . heparin 1,350 Units/hr (10/07/19 0700)   PRN  Meds: acetaminophen, ALPRAZolam, fentaNYL (SUBLIMAZE) injection, hydrocortisone, nitroGLYCERIN, nitroGLYCERIN, ondansetron (ZOFRAN) IV, traMADol  Allergies:    Allergies  Allergen Reactions  . Contrast Media [Iodinated Diagnostic Agents] Shortness Of Breath  . Iohexol Shortness Of Breath     Onset Date: [id number]   . Atorvastatin     Myalgias   . Hydrocodone Itching  . Morphine And Related     Lost control   . Penicillins     Unknown reaction Did it involve swelling of the face/tongue/throat, SOB, or low BP? Unknown Did it involve sudden or severe rash/hives, skin peeling, or any reaction on the inside of your mouth or nose? Unknown Did you need to seek medical attention at a hospital or doctor's office? Yes When did it last happen?Childhood allergy  If all above answers are "NO", may proceed with cephalosporin use.   . Rosuvastatin     Myalgias     Social History:   Social History   Socioeconomic History  . Marital status: Widowed      Spouse name: Not on file  . Number of children: 2  . Years of education: Not on file  . Highest education level: GED or equivalent  Occupational History  . Occupation: Statistician   Tobacco Use  . Smoking status: Former Smoker    Quit date: 06/10/2011    Years since quitting: 8.3  . Smokeless tobacco: Never Used  Substance and Sexual Activity  . Alcohol use: No    Alcohol/week: 0.0 standard drinks  . Drug use: No  . Sexual activity: Not Currently  Other Topics Concern  . Not on file  Social History Narrative  . Not on file   Social Determinants of Health   Financial Resource Strain: Low Risk   . Difficulty of Paying Living Expenses: Not hard at all  Food Insecurity: No Food Insecurity  . Worried About Programme researcher, broadcasting/film/video in the Last Year: Never true  . Ran Out of Food in the Last Year: Never true  Transportation Needs: No Transportation Needs  . Lack of Transportation (Medical): No  . Lack of Transportation  (Non-Medical): No  Physical Activity: Inactive  . Days of Exercise per Week: 0 days  . Minutes of Exercise per Session: 0 min  Stress: No Stress Concern Present  . Feeling of Stress : Not at all  Social Connections: Somewhat Isolated  . Frequency of Communication with Friends and Family: More than three times a week  . Frequency of Social Gatherings with Friends and Family: More than three times a week  . Attends Religious Services: Never  . Active Member of Clubs or Organizations: Yes  . Attends Banker Meetings: Never  . Marital Status: Divorced  Catering manager Violence: Not At Risk  . Fear of Current or Ex-Partner: No  . Emotionally Abused: No  . Physically Abused: No  . Sexually Abused: No    Family History:    Family History  Problem Relation Age of Onset  . Coronary artery disease Mother   . Diabetes Mother   . Coronary artery disease Father      ROS:  Please see the history of present illness.  Review of Systems  Constitutional: Negative.   HENT: Negative.   Respiratory: Negative.   Cardiovascular: Positive for chest pain.       Left arm pain  Gastrointestinal: Negative.   Musculoskeletal: Negative.   Neurological: Negative.   Psychiatric/Behavioral: Negative.   All other systems reviewed and are negative.   Physical Exam/Data:   Vitals:   10/07/19 0000 10/07/19 0053 10/07/19 0412 10/07/19 0808  BP: 130/69 (!) 175/91 (!) 125/50 115/72  Pulse: (!) 50 69 60 (!) 54  Resp: 14 19 19 17   Temp:  98.1 F (36.7 C) 97.7 F (36.5 C) 97.7 F (36.5 C)  TempSrc:  Oral Oral Oral  SpO2: 96% 99% 95% 99%  Weight:  95.8 kg    Height:  5\' 6"  (1.676 m)      Intake/Output Summary (Last 24 hours) at 10/07/2019 1101 Last data filed at 10/07/2019 0700 Gross per 24 hour  Intake 141.05 ml  Output --  Net 141.05 ml   Last 3 Weights 10/07/2019 10/06/2019 09/13/2019  Weight (lbs) 211 lb 4.8 oz 217 lb 214 lb 2 oz  Weight (kg) 95.845 kg 98.431 kg 97.126 kg     Body  mass index is 34.1 kg/m.  General:  Well nourished, well developed, in no acute distress HEENT: normal Lymph: no adenopathy Neck: no JVD Endocrine:  No thryomegaly  Vascular: No carotid bruits; FA pulses 2+ bilaterally without bruits  Cardiac:  normal S1, S2; RRR; no murmur  Lungs:  clear to auscultation bilaterally, no wheezing, rhonchi or rales  Abd: soft, nontender, no hepatomegaly  Ext: no edema Musculoskeletal:  No deformities, BUE and BLE strength normal and equal Skin: warm and dry  Neuro:  CNs 2-12 intact, no focal abnormalities noted Psych:  Normal affect   EKG:  The EKG was personally reviewed and demonstrates:   Normal sinus rhythm rate 78 bpm old inferior MI Telemetry:  Telemetry was personally reviewed and demonstrates: Normal sinus rhythm  Relevant CV Studies:   Laboratory Data:  High Sensitivity Troponin:   Recent Labs  Lab 10/06/19 1835 10/06/19 2042  TROPONINIHS 36* 127*     Chemistry Recent Labs  Lab 10/06/19 1835 10/07/19 0426  NA 139 139  K 3.5 3.6  CL 105 105  CO2 21* 23  GLUCOSE 296* 194*  BUN 11 11  CREATININE 0.86 0.84  CALCIUM 9.4 9.0  GFRNONAA >60 >60  GFRAA >60 >60  ANIONGAP 13 11    No results for input(s): PROT, ALBUMIN, AST, ALT, ALKPHOS, BILITOT in the last 168 hours. Hematology Recent Labs  Lab 10/06/19 1835 10/07/19 0426  WBC 11.8* 9.1  RBC 5.47 4.98  HGB 16.6 15.2  HCT 45.4 42.4  MCV 83.0 85.1  MCH 30.3 30.5  MCHC 36.6* 35.8  RDW 14.6 15.1  PLT 238 212   BNPNo results for input(s): BNP, PROBNP in the last 168 hours.  DDimer No results for input(s): DDIMER in the last 168 hours.   Radiology/Studies:  DG Chest 2 View  Result Date: 10/06/2019 CLINICAL DATA:  Chest pain EXAM: CHEST - 2 VIEW COMPARISON:  2016 FINDINGS: The heart size and mediastinal contours are within normal limits. Both lungs are clear. No pleural effusion or pneumothorax. No acute osseous abnormality. IMPRESSION: No acute process in the chest.  Electronically Signed   By: Guadlupe Spanish [name]   On: 10/06/2019 19:17   CT Head Wo Contrast  Result Date: 10/06/2019 CLINICAL DATA:  Acute headache. Severe headache with facial tightness, pain/numbness in left arm. EXAM: CT HEAD WITHOUT CONTRAST TECHNIQUE: Contiguous axial images were obtained from the base of the skull through the vertex without intravenous contrast. COMPARISON:  None. FINDINGS: Brain: Generalized cerebral atrophy. Moderate periventricular and deep white matter hypodensity most consistent with chronic small vessel ischemia. No evidence of acute infarction, hemorrhage, hydrocephalus, extra-axial collection or mass lesion/mass effect. Vascular: No hyperdense vessel. Skull: No fracture or focal lesion. Sinuses/Orbits: Mucosal thickening of left side of sphenoid sinus with mucous retention cyst. Mucosal thickening of left maxillary sinus with heterogeneous debris. Mild ethmoid sinus mucosal thickening on the left. Prior right cataract resection. Mastoid air cells are clear. Other: None. IMPRESSION: 1. Generalized atrophy and chronic small vessel ischemia. No acute intracranial abnormality. 2. Left paranasal sinus disease. Electronically Signed   By: Narda Rutherford [name]   On: 10/06/2019 19:59    Assessment and Plan:   1.  Unstable angina Known coronary artery disease, prior to RCA occlusion with collaterals left to right, stenting to the left -Last catheterization February 2021 with three-vessel disease, stable.  Distal left circumflex not intervened upon -Presenting now with severe episodes chest pain to times this past week -3 PM starting with severe pain similar to prior " heart attack symptoms" -Still with some discomfort this morning on heparin, Nitropaste, morphine -We will repeat cardiac enzymes -Tramadol for headache -Echocardiogram pending -N.p.o.  after midnight, we will discuss with interventional cardiology If his pain persists may need to perform heart  catheterization -Unable to exclude noncardiac chest pain  CAD prior PCI Known disease as detailed He is not on a statin secondary to myalgias He did not pick up his Zetia as prescribed 2 months ago -We will add Ranexa -We will hold off on adding Imdur as he has Nitropaste in place and having a headache, blood pressure borderline low  Cardiomyopathy, ischemic Continue Entresto, beta-blocker/Coreg Appears relatively euvolemic Echocardiogram ordered to assess ejection fraction   Total encounter time more than 110 minutes  Greater than 50% was spent in counseling and coordination of care with the patient    For questions or updates, please contact [facility] Please consult [email or web address] for contact info under     Signed, Ida Rogue, MD  10/07/2019 11:01 AM

## 2019-10-07 NOTE — Progress Notes (Addendum)
Progress Note    [name].  UXN:[id number] DOB: July 01, 1956  DOA: 10/06/2019 PCP: Albina Billet, MD      Brief Narrative:    Medical records reviewed and are as summarized below:  [name]. is an 64 y.o. male  with medical history significant for coronary artery disease status post MI x3 with recent cardiac cath in February 2021 showing patent stents and severe triple-vessel disease managed with aggressive medical management, as well as history of systolic heart failure EF 25 to 35% on recent cath, secondary to ischemic cardiomyopathy, hypertension and HLD who presents to the emergency room with onset of typical chest pain, retrosternal radiating to the back, throat and left arm of intensity 10 out of 10, similar and somewhat worse than his prior heart attack.  It was associated with diaphoresis and nausea.      Assessment/Plan:   Active Problems:   Unstable angina (HCC)   CAD (coronary artery disease)   Essential hypertension   Hyperlipidemia   Chronic systolic heart failure (HCC)   Ischemic cardiomyopathy   Chest pain, acute NSTEMI, headache and elevated troponins   CAD (coronary artery disease) s/p MI x3, hx of stents Troponin increased from 127-10,336 --Patient had a cath in February 2021 by Dr. Fletcher Anon that showed patent stents and severe triple-vessel disease being managed medically -Continue IV heparin infusion and monitor PTT per protocol.  Continue aspirin,  and Coreg as able.  Discontinue Nitropaste because of severe headache -Fentanyl as needed for pain.  Patient requested tramadol for pain. Patient intolerant to statins.  Follow-up with cardiologist for further recommendations.     Essential hypertension -Continue carvedilol as able     Hyperglycemia -Check hemoglobin A1c.  NovoLog as needed for hyperglycemia.    Hyperlipidemia -Continue Zetia    Chronic systolic heart failure (HCC)    Ischemic cardiomyopathy -Continue Lasix, Entresto  and Aldactone and Coreg    Body mass index is 34.1 kg/m.  (Obesity)   Family Communication/Anticipated D/C date and plan/Code Status   DVT prophylaxis: IV heparin Code Status: Full code Family Communication: Plan discussed with the patient Disposition Plan: Patient is from home.  Plan to discharge him home when cleared by cardiologist, probably in 1 to 2 days.      Subjective:   C/o left arm pain and headache  Objective:    Vitals:   10/07/19 0000 10/07/19 0053 10/07/19 0412 10/07/19 0808  BP: 130/69 (!) 175/91 (!) 125/50 115/72  Pulse: (!) 50 69 60 (!) 54  Resp: 14 19 19 17   Temp:  98.1 F (36.7 C) 97.7 F (36.5 C) 97.7 F (36.5 C)  TempSrc:  Oral Oral Oral  SpO2: 96% 99% 95% 99%  Weight:  95.8 kg    Height:  5\' 6"  (1.676 m)      Intake/Output Summary (Last 24 hours) at 10/07/2019 1053 Last data filed at 10/07/2019 0700 Gross per 24 hour  Intake 141.05 ml  Output --  Net 141.05 ml   Filed Weights   10/06/19 1833 10/07/19 0053  Weight: 98.4 kg 95.8 kg    Exam:  GEN: NAD SKIN: No rash EYES: EOMI ENT: MMM CV: RRR PULM: CTA B ABD: soft, ND, NT, +BS CNS: AAO x 3, non focal EXT: No edema or tenderness   Data Reviewed:   I have personally reviewed following labs and imaging studies:  Labs: Labs show the following:   Basic Metabolic Panel: Recent Labs  Lab  10/06/19 1835 10/07/19 0426  NA 139 139  K 3.5 3.6  CL 105 105  CO2 21* 23  GLUCOSE 296* 194*  BUN 11 11  CREATININE 0.86 0.84  CALCIUM 9.4 9.0   GFR Estimated Creatinine Clearance: 97.5 mL/min (by C-G formula based on SCr of 0.84 mg/dL). Liver Function Tests: No results for input(s): AST, ALT, ALKPHOS, BILITOT, PROT, ALBUMIN in the last 168 hours. No results for input(s): LIPASE, AMYLASE in the last 168 hours. No results for input(s): AMMONIA in the last 168 hours. Coagulation profile Recent Labs  Lab 10/06/19 1836  INR 1.1    CBC: Recent Labs  Lab 10/06/19 1835  10/07/19 0426  WBC 11.8* 9.1  HGB 16.6 15.2  HCT 45.4 42.4  MCV 83.0 85.1  PLT 238 212   Cardiac Enzymes: No results for input(s): CKTOTAL, CKMB, CKMBINDEX, TROPONINI in the last 168 hours. BNP (last 3 results) No results for input(s): PROBNP in the last 8760 hours. CBG: Recent Labs  Lab 10/07/19 0055  GLUCAP 162*   D-Dimer: No results for input(s): DDIMER in the last 72 hours. Hgb A1c: No results for input(s): HGBA1C in the last 72 hours. Lipid Profile: Recent Labs    10/07/19 0426  CHOL 139  HDL 42  LDLCALC 71  TRIG 132  CHOLHDL 3.3   Thyroid function studies: No results for input(s): TSH, T4TOTAL, T3FREE, THYROIDAB in the last 72 hours.  Invalid input(s): FREET3 Anemia work up: No results for input(s): VITAMINB12, FOLATE, FERRITIN, TIBC, IRON, RETICCTPCT in the last 72 hours. Sepsis Labs: Recent Labs  Lab 10/06/19 1835 10/07/19 0426  WBC 11.8* 9.1    Microbiology Recent Results (from the past 240 hour(s))  Respiratory Panel by RT PCR (Flu A&B, Covid) - Nasopharyngeal Swab     Status: None   Collection Time: 10/06/19 11:38 PM   Specimen: Nasopharyngeal Swab  Result Value Ref Range Status   SARS Coronavirus 2 by RT PCR NEGATIVE NEGATIVE Final    Comment: (NOTE) SARS-CoV-2 target nucleic acids are NOT DETECTED. The SARS-CoV-2 RNA is generally detectable in upper respiratoy specimens during the acute phase of infection. The lowest concentration of SARS-CoV-2 viral copies this assay can detect is 131 copies/mL. A negative result does not preclude SARS-Cov-2 infection and should not be used as the sole basis for treatment or other patient management decisions. A negative result may occur with  improper specimen collection/handling, submission of specimen other than nasopharyngeal swab, presence of viral mutation(s) within the areas targeted by this assay, and inadequate number of viral copies (<131 copies/mL). A negative result must be combined with  clinical observations, patient history, and epidemiological information. The expected result is Negative. Fact Sheet for Patients:  https://www.moore.com/ Fact Sheet for Healthcare Providers:  https://www.young.biz/ This test is not yet ap proved or cleared by the [place] FDA and  has been authorized for detection and/or diagnosis of SARS-CoV-2 by FDA under an Emergency Use Authorization (EUA). This EUA will remain  in effect (meaning this test can be used) for the duration of the COVID-19 declaration under Section 564(b)(1) of the Act, 21 U.S.C. section 360bbb-3(b)(1), unless the authorization is terminated or revoked sooner.    Influenza A by PCR NEGATIVE NEGATIVE Final   Influenza B by PCR NEGATIVE NEGATIVE Final    Comment: (NOTE) The Xpert Xpress SARS-CoV-2/FLU/RSV assay is intended as an aid in  the diagnosis of influenza from Nasopharyngeal swab specimens and  should not be used as a sole basis for treatment.  Nasal washings and  aspirates are unacceptable for Xpert Xpress SARS-CoV-2/FLU/RSV  testing. Fact Sheet for Patients: https://www.moore.com/ Fact Sheet for Healthcare Providers: https://www.young.biz/ This test is not yet approved or cleared by the [place] FDA and  has been authorized for detection and/or diagnosis of SARS-CoV-2 by  FDA under an Emergency Use Authorization (EUA). This EUA will remain  in effect (meaning this test can be used) for the duration of the  Covid-19 declaration under Section 564(b)(1) of the Act, 21  U.S.C. section 360bbb-3(b)(1), unless the authorization is  terminated or revoked. Performed at [facility], [address] [place], [place] [address]     Procedures and diagnostic studies:  DG Chest 2 View  Result Date: 10/06/2019 CLINICAL DATA:  Chest pain EXAM: CHEST - 2 VIEW COMPARISON:  2016 FINDINGS: The heart size and mediastinal  contours are within normal limits. Both lungs are clear. No pleural effusion or pneumothorax. No acute osseous abnormality. IMPRESSION: No acute process in the chest. Electronically Signed   By: Guadlupe Spanish [name]   On: 10/06/2019 19:17   CT Head Wo Contrast  Result Date: 10/06/2019 CLINICAL DATA:  Acute headache. Severe headache with facial tightness, pain/numbness in left arm. EXAM: CT HEAD WITHOUT CONTRAST TECHNIQUE: Contiguous axial images were obtained from the base of the skull through the vertex without intravenous contrast. COMPARISON:  None. FINDINGS: Brain: Generalized cerebral atrophy. Moderate periventricular and deep white matter hypodensity most consistent with chronic small vessel ischemia. No evidence of acute infarction, hemorrhage, hydrocephalus, extra-axial collection or mass lesion/mass effect. Vascular: No hyperdense vessel. Skull: No fracture or focal lesion. Sinuses/Orbits: Mucosal thickening of left side of sphenoid sinus with mucous retention cyst. Mucosal thickening of left maxillary sinus with heterogeneous debris. Mild ethmoid sinus mucosal thickening on the left. Prior right cataract resection. Mastoid air cells are clear. Other: None. IMPRESSION: 1. Generalized atrophy and chronic small vessel ischemia. No acute intracranial abnormality. 2. Left paranasal sinus disease. Electronically Signed   By: Narda Rutherford [name]   On: 10/06/2019 19:59    Medications:   . aspirin EC  81 mg Oral Daily  . carvedilol  6.25 mg Oral BID WC  . nitroGLYCERIN  1 inch Topical Q6H  . sodium chloride flush  3 mL Intravenous Once   Continuous Infusions: . heparin 1,350 Units/hr (10/07/19 0700)     LOS: 1 day   [name]  [facility]     10/07/2019, 10:53 AM

## 2019-10-07 NOTE — Consult Note (Signed)
ANTICOAGULATION CONSULT NOTE - Pharmacy Consult for Heparin Infusion Indication: chest pain/ACS  Allergies  Allergen Reactions  . Contrast Media [Iodinated Diagnostic Agents] Shortness Of Breath  . Iohexol Shortness Of Breath     Onset Date: [id number]   . Atorvastatin     Myalgias   . Hydrocodone Itching  . Morphine And Related     Lost control   . Penicillins     Unknown reaction Did it involve swelling of the face/tongue/throat, SOB, or low BP? Unknown Did it involve sudden or severe rash/hives, skin peeling, or any reaction on the inside of your mouth or nose? Unknown Did you need to seek medical attention at a hospital or doctor's office? Yes When did it last happen?Childhood allergy  If all above answers are "NO", may proceed with cephalosporin use.   . Rosuvastatin     Myalgias     Patient Measurements: Height: 5\' 6"  (167.6 cm) Weight: 95.8 kg (211 lb 4.8 oz) IBW/kg (Calculated) : 63.8 Heparin Dosing Weight: 85.3 kg  Vital Signs: Temp: 97.6 F (36.4 C) (04/04 1139) Temp Source: Oral (04/04 1139) BP: 124/70 (04/04 1254) Pulse Rate: 67 (04/04 1254)  Labs: Recent Labs    10/06/19 1835 10/06/19 1836 10/06/19 2042 10/07/19 0426 10/07/19 1235  HGB 16.6  --   --  15.2  --   HCT 45.4  --   --  42.4  --   PLT 238  --   --  212  --   APTT  --  28  --   --   --   LABPROT  --  13.9  --   --   --   INR  --  1.1  --   --   --   HEPARINUNFRC  --   --   --  0.20* 0.39  CREATININE 0.86  --   --  0.84  --   TROPONINIHS 36*  --  127*  --   --     Estimated Creatinine Clearance: 97.5 mL/min (by C-G formula based on SCr of 0.84 mg/dL).   Medical History: Past Medical History:  Diagnosis Date  . Anginal pain ([place])   . Arthritis   . CAD (coronary artery disease)    a. s/p MI with LAD and Diag stenting @ [name];  b. 06/2015 Cath: LAD 88m/d ISR, 100 RCA (ISR) w/ L->R collats, otw mod nonobs dzs-->Med Rx; c. 08/2019 Cath: LM nl, LAD 10p/m ISR, D1 20, D2 100, RI min  irregs, LCX 13m/d, OM1 100, OM2 50, RCA 100p, 70d. RPDA fills via collats from LAD. EF 25-35%-->Med Rx.  . Chronic combined systolic and diastolic CHF (congestive heart failure) ([employer])    a. 06/2015 Echo: EF 20-25%, Gr3 DD; b. 04/2019 Echo: EF 40-45% w/ inf, infsept, ap ant, mid ant HK. Gr1 DD. Mild MR. Nl RV fxn.  [name] Dyspnea   . Essential hypertension   . Hyperlipidemia   . Hypokalemia    a. 06/2015 in setting of diuresis.  . Ischemic cardiomyopathy    a. 2011 EF 45% ([name]);  b. 06/2015 Echo: EF 20-25%; c. 04/2019 Echo: EF 40-45%; d. 08/2019 LV gram: EF 25-35%.  . Sleep apnea     Medications:  Medications Prior to Admission  Medication Sig Dispense Refill Last Dose  . acetaminophen (TYLENOL) 500 MG tablet Take 1,000 mg by mouth at bedtime as needed. At bedtime and as needed.   10/06/2019 at 1600  . aspirin 81 MG chewable tablet Chew 1  tablet (81 mg total) by mouth daily. 30 tablet 0 10/05/2019 at 2100  . carvedilol (COREG) 6.25 MG tablet Take 1 tablet (6.25 mg total) by mouth 2 (two) times daily. 180 tablet 3 10/06/2019 at 0600  . Clotrimazole (JOCK ITCH RELIEF EX) Apply 1 application topically daily as needed (jock itch).   prn at prn  . colchicine 0.6 MG tablet Take 1 tablet (0.6 mg total) by mouth daily as needed (gout). 10 tablet 3 Past Week at prn  . furosemide (LASIX) 20 MG tablet Take 1 tablet (20 mg total) by mouth daily. 30 tablet 5 10/05/2019 at 0600  . lip balm (BLISTEX) OINT Apply 1 application topically as needed for lip care.   prn at prn  . sacubitril-valsartan (ENTRESTO) 49-51 MG Take 1 tablet by mouth 2 (two) times daily.   10/06/2019 at 0600  . ezetimibe (ZETIA) 10 MG tablet Take 1 tablet (10 mg total) by mouth daily. (Patient not taking: Reported on 10/06/2019) 90 tablet 3 Not Taking at Unknown time  . spironolactone (ALDACTONE) 25 MG tablet Take 1 tablet (25 mg total) by mouth daily. (Patient not taking: Reported on 10/06/2019) 90 tablet 3 Not Taking at Unknown time   Scheduled:  .  aspirin EC  81 mg Oral Daily  . carvedilol  6.25 mg Oral BID WC  . insulin aspart  0-9 Units Subcutaneous Q6H  . sodium chloride flush  3 mL Intravenous Once   Infusions:  . heparin 1,350 Units/hr (10/07/19 0700)   PRN: acetaminophen, ALPRAZolam, fentaNYL (SUBLIMAZE) injection, hydrocortisone, nitroGLYCERIN, nitroGLYCERIN, ondansetron (ZOFRAN) IV, traMADol Anti-infectives (From admission, onward)   None      Assessment: Pharmacy has been consulted to initiate heparin infusion in 64yo presenting to the ED with substernal chest pain. Patient has a significant cardiac history, including multiple heart attacks that he states have presented similarly. Initial troponin level of 36, repeat level of 127.  Patient has no prior history of anticoagulant use PTA. Baseline labs have been ordered and are pending. Will initiate heparin infusion immediately.  Give 4000 units bolus x 1 Start heparin infusion at 1150 units/hr Check anti-Xa level in 6 hours and daily while on heparin Continue to monitor H&H and platelets  0404 0426 HL 0.20, SUBtherapeutic.  CBC stable.  Will increase Heparin drip to 1350 units/hr and recheck HL ~ 6 hours after rate increased.     Goal of Therapy:  Heparin level 0.3-0.7 units/ml Monitor platelets by anticoagulation protocol: Yes   Plan:  4/4 @ 1235 HL=0.39. therapeutic. Will continue current drip rate of 1350 units/hr. Will check confirmatory level in 6 hours.   Anara Cowman A 10/07/2019,1:09 PM

## 2019-10-07 NOTE — Consult Note (Signed)
ANTICOAGULATION CONSULT NOTE - Pharmacy Consult for Heparin Infusion Indication: chest pain/ACS  Allergies  Allergen Reactions  . Contrast Media [Iodinated Diagnostic Agents] Shortness Of Breath  . Iohexol Shortness Of Breath     Onset Date: [id number]   . Atorvastatin     Myalgias   . Hydrocodone Itching  . Morphine And Related     Lost control   . Penicillins     Unknown reaction Did it involve swelling of the face/tongue/throat, SOB, or low BP? Unknown Did it involve sudden or severe rash/hives, skin peeling, or any reaction on the inside of your mouth or nose? Unknown Did you need to seek medical attention at a hospital or doctor's office? Yes When did it last happen?Childhood allergy  If all above answers are "NO", may proceed with cephalosporin use.   . Rosuvastatin     Myalgias     Patient Measurements: Height: 5\' 6"  (167.6 cm) Weight: 95.8 kg (211 lb 4.8 oz) IBW/kg (Calculated) : 63.8 Heparin Dosing Weight: 85.3 kg  Vital Signs: Temp: 97.7 F (36.5 C) (04/04 0412) Temp Source: Oral (04/04 0412) BP: 125/50 (04/04 0412) Pulse Rate: 60 (04/04 0412)  Labs: Recent Labs    10/06/19 1835 10/06/19 1836 10/06/19 2042 10/07/19 0426  HGB 16.6  --   --  15.2  HCT 45.4  --   --  42.4  PLT 238  --   --  212  APTT  --  28  --   --   LABPROT  --  13.9  --   --   INR  --  1.1  --   --   HEPARINUNFRC  --   --   --  0.20*  CREATININE 0.86  --   --  0.84  TROPONINIHS 36*  --  127*  --     Estimated Creatinine Clearance: 97.5 mL/min (by C-G formula based on SCr of 0.84 mg/dL).   Medical History: Past Medical History:  Diagnosis Date  . Anginal pain ([facility])   . Arthritis   . CAD (coronary artery disease)    a. s/p MI with LAD and Diag stenting @ [name];  b. 06/2015 Cath: LAD 23m/d ISR, 100 RCA (ISR) w/ L->R collats, otw mod nonobs dzs-->Med Rx; c. 08/2019 Cath: LM nl, LAD 10p/m ISR, D1 20, D2 100, RI min irregs, LCX 5m/d, OM1 100, OM2 50, RCA 100p, 70d. RPDA  fills via collats from LAD. EF 25-35%-->Med Rx.  . Chronic combined systolic and diastolic CHF (congestive heart failure) ([facility])    a. 06/2015 Echo: EF 20-25%, Gr3 DD; b. 04/2019 Echo: EF 40-45% w/ inf, infsept, ap ant, mid ant HK. Gr1 DD. Mild MR. Nl RV fxn.  05/2019 Dyspnea   . Essential hypertension   . Hyperlipidemia   . Hypokalemia    a. 06/2015 in setting of diuresis.  . Ischemic cardiomyopathy    a. 2011 EF 45% ([name]);  b. 06/2015 Echo: EF 20-25%; c. 04/2019 Echo: EF 40-45%; d. 08/2019 LV gram: EF 25-35%.  . Sleep apnea     Medications:  Medications Prior to Admission  Medication Sig Dispense Refill Last Dose  . acetaminophen (TYLENOL) 500 MG tablet Take 1,000 mg by mouth at bedtime as needed. At bedtime and as needed.   10/06/2019 at 1600  . aspirin 81 MG chewable tablet Chew 1 tablet (81 mg total) by mouth daily. 30 tablet 0 10/05/2019 at 2100  . carvedilol (COREG) 6.25 MG tablet Take 1 tablet (6.25 mg total) by  mouth 2 (two) times daily. 180 tablet 3 10/06/2019 at 0600  . Clotrimazole (JOCK ITCH RELIEF EX) Apply 1 application topically daily as needed (jock itch).   prn at prn  . colchicine 0.6 MG tablet Take 1 tablet (0.6 mg total) by mouth daily as needed (gout). 10 tablet 3 Past Week at prn  . furosemide (LASIX) 20 MG tablet Take 1 tablet (20 mg total) by mouth daily. 30 tablet 5 10/05/2019 at 0600  . lip balm (BLISTEX) OINT Apply 1 application topically as needed for lip care.   prn at prn  . sacubitril-valsartan (ENTRESTO) 49-51 MG Take 1 tablet by mouth 2 (two) times daily.   10/06/2019 at 0600  . ezetimibe (ZETIA) 10 MG tablet Take 1 tablet (10 mg total) by mouth daily. (Patient not taking: Reported on 10/06/2019) 90 tablet 3 Not Taking at Unknown time  . spironolactone (ALDACTONE) 25 MG tablet Take 1 tablet (25 mg total) by mouth daily. (Patient not taking: Reported on 10/06/2019) 90 tablet 3 Not Taking at Unknown time   Scheduled:  . aspirin EC  81 mg Oral Daily  . carvedilol  6.25 mg Oral  BID WC  . nitroGLYCERIN  1 inch Topical Q6H  . sodium chloride flush  3 mL Intravenous Once   Infusions:  . heparin 1,150 Units/hr (10/07/19 0413)   PRN: acetaminophen, ALPRAZolam, fentaNYL (SUBLIMAZE) injection, hydrocortisone, nitroGLYCERIN, nitroGLYCERIN, ondansetron (ZOFRAN) IV Anti-infectives (From admission, onward)   None      Assessment: Pharmacy has been consulted to initiate heparin infusion in 64yo presenting to the ED with substernal chest pain. Patient has a significant cardiac history, including multiple heart attacks that he states have presented similarly. Initial troponin level of 36, repeat level of 127.  Patient has no prior history of anticoagulant use PTA. Baseline labs have been ordered and are pending. Will initiate heparin infusion immediately.   Goal of Therapy:  Heparin level 0.3-0.7 units/ml Monitor platelets by anticoagulation protocol: Yes   Plan:  Give 4000 units bolus x 1 Start heparin infusion at 1150 units/hr Check anti-Xa level in 6 hours and daily while on heparin Continue to monitor H&H and platelets   0404 0426 HL 0.20, SUBtherapeutic.  CBC stable.  Will increase Heparin drip to 1350 units/hr and recheck HL ~ 6 hours after rate increased.     [place] Crane, Criselda Starke A 10/07/2019,5:57 AM

## 2019-10-07 NOTE — H&P (View-Only) (Signed)
Cardiology Consultation:   Patient ID: [name]. MRN: [id number]; DOB: 05/21/1956  Admit date: 10/06/2019 Date of Consult: 10/07/2019  Primary Care Provider: [name], MD Primary Cardiologist: Lorine Bears, MD  Physician requesting consult: Dr. Para March Reason for consult: Unstable angina   Patient Profile:   [name]. is a 64 y.o. male with a hx of coronary artery disease status post prior LAD, diagonal, and RCA stenting with subsequent known occlusion of the RCA, ischemic cardiomyopathy, chronic combined systolic diastolic congestive heart failure with an EF of 25-35%, hypertension, and hyperlipidemia, diagnostic catheterization August 20, 2019 with details as below, presenting with chest pain/arm pain  History of Present Illness:   [name] reports having fleeting chest pain when he was last seen by Nicolasa Ducking September 13, 2019 Did not think anything of it as they were short, fleeting but sharp  Reports having 2 episodes of severe chest pain reminiscent of his anginal/heart attack pain in the past First episode for 5 days ago middle of last week lasting 5 to 6 hours chest pain radiating to left arm, went away without intervention  3 PM yesterday developed severe chest and left arm pain Reports pain was intense like his original heart attack Presented to the emergency room for further evaluation  In the ER continues to have severe left arm pain shoulder down to elbow area, also severe mediastinal chest pain Was started on heparin, Nitropaste, morphine Reports he continues to have discomfort this morning  Initial troponin 36 with repeat 127  Biggest complaint this morning is the headache presumably from the Nitropaste  Reports statin intolerance, not taking Lipitor, Crestor Started on Zetia 2 months ago, has not started this yet  Prior cardiac history reviewed status post remote myocardial infarction with previous LAD, diagonal, and RCA stenting at  [facility].    Cardiac catheterization 2016 showed patent LAD and diagonal stents but a chronically occluded RCA with left-to-right collaterals.    EF of 20 to 25% by echocardiogram in December 2016,   Echo in 04/2019 [facility]) showed EF of 40-45%.    clinic August 16 2019 with reports of intermittent rest and exertional chest discomfort as well as exertional dyspnea and progressive fatigue.     catheterization February 15 revealing relatively stable coronary anatomy with patent LAD and diagonal stents and known occlusion of the RCA with left-to-right collaterals.  Circumflex disease was worse since 2016 but was supplying a small area of myocardium.  EF by ventriculography was 25-35%.    Past Medical History:  Diagnosis Date  . Anginal pain (HCC)   . Arthritis   . CAD (coronary artery disease)    a. s/p MI with LAD and Diag stenting @ [name];  b. 06/2015 Cath: LAD 65m/d ISR, 100 RCA (ISR) w/ L->R collats, otw mod nonobs dzs-->Med Rx; c. 08/2019 Cath: LM nl, LAD 10p/m ISR, D1 20, D2 100, RI min irregs, LCX 73m/d, OM1 100, OM2 50, RCA 100p, 70d. RPDA fills via collats from LAD. EF 25-35%-->Med Rx.  . Chronic combined systolic and diastolic CHF (congestive heart failure) (HCC)    a. 06/2015 Echo: EF 20-25%, Gr3 DD; b. 04/2019 Echo: EF 40-45% w/ inf, infsept, ap ant, mid ant HK. Gr1 DD. Mild MR. Nl RV fxn.  [name] Dyspnea   . Essential hypertension   . Hyperlipidemia   . Hypokalemia    a. 06/2015 in setting of diuresis.  . Ischemic cardiomyopathy    a. 2011 EF 45% ([name]);  b. 06/2015 Echo: EF 20-25%; c. 04/2019 Echo: EF 40-45%; d. 08/2019 LV gram: EF 25-35%.  . Sleep apnea     Past Surgical History:  Procedure Laterality Date  . BACK SURGERY  04/2019  . CARDIAC CATHETERIZATION N/A 06/10/2015   Procedure: Left Heart Cath;  Surgeon: Iran Ouch, MD;  Location: [facility] INVASIVE CV LAB;  Service: Cardiovascular;  Laterality: N/A;  . CORONARY STENT PLACEMENT    . RIGHT/LEFT HEART CATH AND CORONARY  ANGIOGRAPHY N/A 08/20/2019   Procedure: RIGHT/LEFT HEART CATH AND CORONARY ANGIOGRAPHY;  Surgeon: Iran Ouch, MD;  Location: [facility] INVASIVE CV LAB;  Service: Cardiovascular;  Laterality: N/A;     Home Medications:  Prior to Admission medications   Medication Sig Start Date End Date Taking? Authorizing Provider  acetaminophen (TYLENOL) 500 MG tablet Take 1,000 mg by mouth at bedtime as needed. At bedtime and as needed.   Yes [provider]  aspirin 81 MG chewable tablet Chew 1 tablet (81 mg total) by mouth daily. 06/11/15  Yes Auburn Bilberry, MD  carvedilol (COREG) 6.25 MG tablet Take 1 tablet (6.25 mg total) by mouth 2 (two) times daily. 10/01/19 12/30/19 Yes Creig Hines, NP  Clotrimazole [facility] RELIEF EX) Apply 1 application topically daily as needed (jock itch).   Yes [provider]  colchicine 0.6 MG tablet Take 1 tablet (0.6 mg total) by mouth daily as needed (gout). 09/13/19  Yes Creig Hines, NP  furosemide (LASIX) 20 MG tablet Take 1 tablet (20 mg total) by mouth daily. 08/16/19 08/15/20 Yes Hackney, Jarold Song, FNP  lip balm (BLISTEX) OINT Apply 1 application topically as needed for lip care.   Yes [provider]  sacubitril-valsartan (ENTRESTO) 49-51 MG Take 1 tablet by mouth 2 (two) times daily.   Yes [provider]  ezetimibe (ZETIA) 10 MG tablet Take 1 tablet (10 mg total) by mouth daily. Patient not taking: Reported on 10/06/2019 09/13/19 12/12/19  Creig Hines, NP  spironolactone (ALDACTONE) 25 MG tablet Take 1 tablet (25 mg total) by mouth daily. Patient not taking: Reported on 10/06/2019 09/21/19 12/20/19  Creig Hines, NP    Inpatient Medications: Scheduled Meds: . aspirin EC  81 mg Oral Daily  . carvedilol  6.25 mg Oral BID WC  . nitroGLYCERIN  1 inch Topical Q6H  . sodium chloride flush  3 mL Intravenous Once   Continuous Infusions: . heparin 1,350 Units/hr (10/07/19 0700)   PRN  Meds: acetaminophen, ALPRAZolam, fentaNYL (SUBLIMAZE) injection, hydrocortisone, nitroGLYCERIN, nitroGLYCERIN, ondansetron (ZOFRAN) IV, traMADol  Allergies:    Allergies  Allergen Reactions  . Contrast Media [Iodinated Diagnostic Agents] Shortness Of Breath  . Iohexol Shortness Of Breath     Onset Date: [id number]   . Atorvastatin     Myalgias   . Hydrocodone Itching  . Morphine And Related     Lost control   . Penicillins     Unknown reaction Did it involve swelling of the face/tongue/throat, SOB, or low BP? Unknown Did it involve sudden or severe rash/hives, skin peeling, or any reaction on the inside of your mouth or nose? Unknown Did you need to seek medical attention at a hospital or doctor's office? Yes When did it last happen?Childhood allergy  If all above answers are "NO", may proceed with cephalosporin use.   . Rosuvastatin     Myalgias     Social History:   Social History   Socioeconomic History  . Marital status: Widowed  Spouse name: Not on file  . Number of children: 2  . Years of education: Not on file  . Highest education level: GED or equivalent  Occupational History  . Occupation: Statistician   Tobacco Use  . Smoking status: Former Smoker    Quit date: 06/10/2011    Years since quitting: 8.3  . Smokeless tobacco: Never Used  Substance and Sexual Activity  . Alcohol use: No    Alcohol/week: 0.0 standard drinks  . Drug use: No  . Sexual activity: Not Currently  Other Topics Concern  . Not on file  Social History Narrative  . Not on file   Social Determinants of Health   Financial Resource Strain: Low Risk   . Difficulty of Paying Living Expenses: Not hard at all  Food Insecurity: No Food Insecurity  . Worried About Programme researcher, broadcasting/film/video in the Last Year: Never true  . Ran Out of Food in the Last Year: Never true  Transportation Needs: No Transportation Needs  . Lack of Transportation (Medical): No  . Lack of Transportation  (Non-Medical): No  Physical Activity: Inactive  . Days of Exercise per Week: 0 days  . Minutes of Exercise per Session: 0 min  Stress: No Stress Concern Present  . Feeling of Stress : Not at all  Social Connections: Somewhat Isolated  . Frequency of Communication with Friends and Family: More than three times a week  . Frequency of Social Gatherings with Friends and Family: More than three times a week  . Attends Religious Services: Never  . Active Member of Clubs or Organizations: Yes  . Attends Banker Meetings: Never  . Marital Status: Divorced  Catering manager Violence: Not At Risk  . Fear of Current or Ex-Partner: No  . Emotionally Abused: No  . Physically Abused: No  . Sexually Abused: No    Family History:    Family History  Problem Relation Age of Onset  . Coronary artery disease Mother   . Diabetes Mother   . Coronary artery disease Father      ROS:  Please see the history of present illness.  Review of Systems  Constitutional: Negative.   HENT: Negative.   Respiratory: Negative.   Cardiovascular: Positive for chest pain.       Left arm pain  Gastrointestinal: Negative.   Musculoskeletal: Negative.   Neurological: Negative.   Psychiatric/Behavioral: Negative.   All other systems reviewed and are negative.   Physical Exam/Data:   Vitals:   10/07/19 0000 10/07/19 0053 10/07/19 0412 10/07/19 0808  BP: 130/69 (!) 175/91 (!) 125/50 115/72  Pulse: (!) 50 69 60 (!) 54  Resp: 14 19 19 17   Temp:  98.1 F (36.7 C) 97.7 F (36.5 C) 97.7 F (36.5 C)  TempSrc:  Oral Oral Oral  SpO2: 96% 99% 95% 99%  Weight:  95.8 kg    Height:  5\' 6"  (1.676 m)      Intake/Output Summary (Last 24 hours) at 10/07/2019 1101 Last data filed at 10/07/2019 0700 Gross per 24 hour  Intake 141.05 ml  Output --  Net 141.05 ml   Last 3 Weights 10/07/2019 10/06/2019 09/13/2019  Weight (lbs) 211 lb 4.8 oz 217 lb 214 lb 2 oz  Weight (kg) 95.845 kg 98.431 kg 97.126 kg     Body  mass index is 34.1 kg/m.  General:  Well nourished, well developed, in no acute distress HEENT: normal Lymph: no adenopathy Neck: no JVD Endocrine:  No thryomegaly  Vascular: No carotid bruits; FA pulses 2+ bilaterally without bruits  Cardiac:  normal S1, S2; RRR; no murmur  Lungs:  clear to auscultation bilaterally, no wheezing, rhonchi or rales  Abd: soft, nontender, no hepatomegaly  Ext: no edema Musculoskeletal:  No deformities, BUE and BLE strength normal and equal Skin: warm and dry  Neuro:  CNs 2-12 intact, no focal abnormalities noted Psych:  Normal affect   EKG:  The EKG was personally reviewed and demonstrates:   Normal sinus rhythm rate 78 bpm old inferior MI Telemetry:  Telemetry was personally reviewed and demonstrates: Normal sinus rhythm  Relevant CV Studies:   Laboratory Data:  High Sensitivity Troponin:   Recent Labs  Lab 10/06/19 1835 10/06/19 2042  TROPONINIHS 36* 127*     Chemistry Recent Labs  Lab 10/06/19 1835 10/07/19 0426  NA 139 139  K 3.5 3.6  CL 105 105  CO2 21* 23  GLUCOSE 296* 194*  BUN 11 11  CREATININE 0.86 0.84  CALCIUM 9.4 9.0  GFRNONAA >60 >60  GFRAA >60 >60  ANIONGAP 13 11    No results for input(s): PROT, ALBUMIN, AST, ALT, ALKPHOS, BILITOT in the last 168 hours. Hematology Recent Labs  Lab 10/06/19 1835 10/07/19 0426  WBC 11.8* 9.1  RBC 5.47 4.98  HGB 16.6 15.2  HCT 45.4 42.4  MCV 83.0 85.1  MCH 30.3 30.5  MCHC 36.6* 35.8  RDW 14.6 15.1  PLT 238 212   BNPNo results for input(s): BNP, PROBNP in the last 168 hours.  DDimer No results for input(s): DDIMER in the last 168 hours.   Radiology/Studies:  DG Chest 2 View  Result Date: 10/06/2019 CLINICAL DATA:  Chest pain EXAM: CHEST - 2 VIEW COMPARISON:  2016 FINDINGS: The heart size and mediastinal contours are within normal limits. Both lungs are clear. No pleural effusion or pneumothorax. No acute osseous abnormality. IMPRESSION: No acute process in the chest.  Electronically Signed   By: Guadlupe Spanish [name]   On: 10/06/2019 19:17   CT Head Wo Contrast  Result Date: 10/06/2019 CLINICAL DATA:  Acute headache. Severe headache with facial tightness, pain/numbness in left arm. EXAM: CT HEAD WITHOUT CONTRAST TECHNIQUE: Contiguous axial images were obtained from the base of the skull through the vertex without intravenous contrast. COMPARISON:  None. FINDINGS: Brain: Generalized cerebral atrophy. Moderate periventricular and deep white matter hypodensity most consistent with chronic small vessel ischemia. No evidence of acute infarction, hemorrhage, hydrocephalus, extra-axial collection or mass lesion/mass effect. Vascular: No hyperdense vessel. Skull: No fracture or focal lesion. Sinuses/Orbits: Mucosal thickening of left side of sphenoid sinus with mucous retention cyst. Mucosal thickening of left maxillary sinus with heterogeneous debris. Mild ethmoid sinus mucosal thickening on the left. Prior right cataract resection. Mastoid air cells are clear. Other: None. IMPRESSION: 1. Generalized atrophy and chronic small vessel ischemia. No acute intracranial abnormality. 2. Left paranasal sinus disease. Electronically Signed   By: Narda Rutherford [name]   On: 10/06/2019 19:59    Assessment and Plan:   1.  Unstable angina Known coronary artery disease, prior to RCA occlusion with collaterals left to right, stenting to the left -Last catheterization February 2021 with three-vessel disease, stable.  Distal left circumflex not intervened upon -Presenting now with severe episodes chest pain to times this past week -3 PM starting with severe pain similar to prior " heart attack symptoms" -Still with some discomfort this morning on heparin, Nitropaste, morphine -We will repeat cardiac enzymes -Tramadol for headache -Echocardiogram pending -N.p.o.  after midnight, we will discuss with interventional cardiology If his pain persists may need to perform heart  catheterization -Unable to exclude noncardiac chest pain  CAD prior PCI Known disease as detailed He is not on a statin secondary to myalgias He did not pick up his Zetia as prescribed 2 months ago -We will add Ranexa -We will hold off on adding Imdur as he has Nitropaste in place and having a headache, blood pressure borderline low  Cardiomyopathy, ischemic Continue Entresto, beta-blocker/Coreg Appears relatively euvolemic Echocardiogram ordered to assess ejection fraction   Total encounter time more than 110 minutes  Greater than 50% was spent in counseling and coordination of care with the patient    For questions or updates, please contact [facility] Please consult [email or web address] for contact info under     Signed, Ida Rogue, MD  10/07/2019 11:01 AM

## 2019-10-07 NOTE — Plan of Care (Signed)
  Problem: Education: Goal: Knowledge of General Education information will improve Description: Including pain rating scale, medication(s)/side effects and non-pharmacologic comfort measures Outcome: Progressing Note: Patient profile complete, C/O HA. Non compliant with instruction. No skin issues. Requested Pt use urinal.

## 2019-10-07 NOTE — Consult Note (Signed)
ANTICOAGULATION CONSULT NOTE - Pharmacy Consult for Heparin Infusion Indication: chest pain/ACS  Allergies  Allergen Reactions  . Contrast Media [Iodinated Diagnostic Agents] Shortness Of Breath  . Iohexol Shortness Of Breath     Onset Date: [id number]   . Atorvastatin     Myalgias   . Hydrocodone Itching  . Morphine And Related     Lost control   . Penicillins     Unknown reaction Did it involve swelling of the face/tongue/throat, SOB, or low BP? Unknown Did it involve sudden or severe rash/hives, skin peeling, or any reaction on the inside of your mouth or nose? Unknown Did you need to seek medical attention at a hospital or doctor's office? Yes When did it last happen?Childhood allergy  If all above answers are "NO", may proceed with cephalosporin use.   . Rosuvastatin     Myalgias     Patient Measurements: Height: 5\' 6"  (167.6 cm) Weight: 95.8 kg (211 lb 4.8 oz) IBW/kg (Calculated) : 63.8 Heparin Dosing Weight: 84.6 kg  Vital Signs: Temp: 98.6 F (37 C) (04/04 1930) Temp Source: Oral (04/04 1930) BP: 148/83 (04/04 1930) Pulse Rate: 61 (04/04 1930)  Labs: Recent Labs    10/06/19 1835 10/06/19 1835 10/06/19 1836 10/06/19 2042 10/07/19 0426 10/07/19 1235 10/07/19 1357 10/07/19 1828  HGB 16.6  --   --   --  15.2  --   --   --   HCT 45.4  --   --   --  42.4  --   --   --   PLT 238  --   --   --  212  --   --   --   APTT  --   --  28  --   --   --   --   --   LABPROT  --   --  13.9  --   --   --   --   --   INR  --   --  1.1  --   --   --   --   --   HEPARINUNFRC  --   --   --   --  0.20* 0.39  --  0.29*  CREATININE 0.86  --   --   --  0.84  --   --   --   TROPONINIHS 36*   < >  --  127*  --  10,336* 10,564*  --    < > = values in this interval not displayed.    Estimated Creatinine Clearance: 97.5 mL/min (by C-G formula based on SCr of 0.84 mg/dL).   Medical History: Past Medical History:  Diagnosis Date  . Anginal pain ([place])   . Arthritis    . CAD (coronary artery disease)    a. s/p MI with LAD and Diag stenting @ [name];  b. 06/2015 Cath: LAD 6m/d ISR, 100 RCA (ISR) w/ L->R collats, otw mod nonobs dzs-->Med Rx; c. 08/2019 Cath: LM nl, LAD 10p/m ISR, D1 20, D2 100, RI min irregs, LCX 57m/d, OM1 100, OM2 50, RCA 100p, 70d. RPDA fills via collats from LAD. EF 25-35%-->Med Rx.  . Chronic combined systolic and diastolic CHF (congestive heart failure) ([place])    a. 06/2015 Echo: EF 20-25%, Gr3 DD; b. 04/2019 Echo: EF 40-45% w/ inf, infsept, ap ant, mid ant HK. Gr1 DD. Mild MR. Nl RV fxn.  [name] Dyspnea   . Essential hypertension   . Hyperlipidemia   . Hypokalemia  a. 06/2015 in setting of diuresis.  . Ischemic cardiomyopathy    a. 2011 EF 45% ([name]);  b. 06/2015 Echo: EF 20-25%; c. 04/2019 Echo: EF 40-45%; d. 08/2019 LV gram: EF 25-35%.  . Sleep apnea     Medications:  Medications Prior to Admission  Medication Sig Dispense Refill Last Dose  . acetaminophen (TYLENOL) 500 MG tablet Take 1,000 mg by mouth at bedtime as needed. At bedtime and as needed.   10/06/2019 at 1600  . aspirin 81 MG chewable tablet Chew 1 tablet (81 mg total) by mouth daily. 30 tablet 0 10/05/2019 at 2100  . carvedilol (COREG) 6.25 MG tablet Take 1 tablet (6.25 mg total) by mouth 2 (two) times daily. 180 tablet 3 10/06/2019 at 0600  . Clotrimazole (JOCK ITCH RELIEF EX) Apply 1 application topically daily as needed (jock itch).   prn at prn  . colchicine 0.6 MG tablet Take 1 tablet (0.6 mg total) by mouth daily as needed (gout). 10 tablet 3 Past Week at prn  . furosemide (LASIX) 20 MG tablet Take 1 tablet (20 mg total) by mouth daily. 30 tablet 5 10/05/2019 at 0600  . lip balm (BLISTEX) OINT Apply 1 application topically as needed for lip care.   prn at prn  . sacubitril-valsartan (ENTRESTO) 49-51 MG Take 1 tablet by mouth 2 (two) times daily.   10/06/2019 at 0600  . ezetimibe (ZETIA) 10 MG tablet Take 1 tablet (10 mg total) by mouth daily. (Patient not taking: Reported on  10/06/2019) 90 tablet 3 Not Taking at Unknown time  . spironolactone (ALDACTONE) 25 MG tablet Take 1 tablet (25 mg total) by mouth daily. (Patient not taking: Reported on 10/06/2019) 90 tablet 3 Not Taking at Unknown time   Scheduled:  . [START ON 10/08/2019] aspirin  81 mg Oral Pre-Cath  . aspirin EC  81 mg Oral Daily  . carvedilol  6.25 mg Oral BID WC  . insulin aspart  0-9 Units Subcutaneous Q6H  . sodium chloride flush  3 mL Intravenous Once   Infusions:  . sodium chloride    . heparin 1,350 Units/hr (10/07/19 1513)   PRN: acetaminophen, ALPRAZolam, fentaNYL (SUBLIMAZE) injection, hydrocortisone, nitroGLYCERIN, nitroGLYCERIN, ondansetron (ZOFRAN) IV, traMADol Anti-infectives (From admission, onward)   None      Assessment: Pharmacy has been consulted to initiate heparin infusion in 64yo presenting to the ED with substernal chest pain. Patient has a significant cardiac history, including multiple heart attacks that he states have presented similarly. Initial troponin level of 36, repeat level of 127.  Patient has no prior history of anticoagulant use PTA. Baseline labs have been ordered and are pending. Will initiate heparin infusion immediately.  Give 4000 units bolus x 1 Start heparin infusion at 1150 units/hr Check anti-Xa level in 6 hours and daily while on heparin Continue to monitor H&H and platelets  0404 0426 HL 0.20, SUBtherapeutic.  CBC stable.  Will increase Heparin drip to 1350 units/hr and recheck HL ~ 6 hours after rate increased.    4/4 @ 1235 HL=0.39. therapeutic. Will continue current drip rate of 1350 units/hr.   Goal of Therapy:  Heparin level 0.3-0.7 units/ml Monitor platelets by anticoagulation protocol: Yes   Plan:  4/4@1828  HL 0.29, subtherapeutic. Will increase Heparin infustion rate to 1500 units/hr and recheck heparin level in 6 hours.   Azia Toutant A Endy Easterly 10/07/2019,7:36 PM

## 2019-10-07 NOTE — Progress Notes (Signed)
Nutrition Brief Note  Patient identified on the Malnutrition Screening Tool (MST) Report  64 y.o. male with medical history significant for coronary artery disease status post MI x3 with recent cardiac cath in February 2021 showing patent stents and severe triple-vessel disease managed with aggressive medical management, as well as history of systolic heart failure EF 25 to 35% on recent cath, secondary to ischemic cardiomyopathy, hypertension and HLD who presents to the emergency room with onset of typical chest pain  Wt Readings from Last 15 Encounters:  10/07/19 95.8 kg  09/13/19 97.1 kg  09/13/19 97.1 kg  08/20/19 98.4 kg  08/16/19 97.6 kg  08/16/19 98 kg  07/16/19 98.9 kg  06/14/19 99 kg  04/30/19 90.3 kg  04/27/19 117.9 kg  06/07/15 97 kg    Body mass index is 34.1 kg/m. Patient meets criteria for obesity based on current BMI. Per chart, pt down 3lbs(1%) over the past 2 weeks; this is not significant.   Current diet order is CHO; pt ordered for NPO at midnight for possible cardiac cath tomorrow.   No nutrition interventions warranted at this time. If nutrition issues arise, please consult RD.   Betsey Holiday MS, RD, LDN Please refer to [facility] for RD and/or RD on-call/weekend/after hours pager

## 2019-10-08 ENCOUNTER — Encounter
Admission: EM | Disposition: A | Discharge status: Home Health | Payer: Self-pay | Source: Home / Self Care | Attending: Internal Medicine

## 2019-10-08 ENCOUNTER — Encounter: Payer: Self-pay | Admitting: Internal Medicine

## 2019-10-08 DIAGNOSIS — E1165 Type 2 diabetes mellitus with hyperglycemia: Secondary | ICD-10-CM

## 2019-10-08 DIAGNOSIS — I214 Non-ST elevation (NSTEMI) myocardial infarction: Secondary | ICD-10-CM | POA: Diagnosis present

## 2019-10-08 HISTORY — PX: CORONARY STENT INTERVENTION: CATH118234

## 2019-10-08 HISTORY — PX: LEFT HEART CATH AND CORONARY ANGIOGRAPHY: CATH118249

## 2019-10-08 HISTORY — DX: Type 2 diabetes mellitus with hyperglycemia: E11.65

## 2019-10-08 LAB — CREATININE, SERUM
Creatinine, Ser: 0.82 mg/dL (ref 0.61–1.24)
GFR calc Af Amer: >60 mL/min (ref >60)
GFR calc non Af Amer: >60 mL/min (ref >60)

## 2019-10-08 LAB — CBC
HCT: 40.6 % (ref 39.0–52.0)
Hemoglobin: 13.9 g/dL (ref 13.0–17.0)
MCH: 30.1 pg (ref 26.0–34.0)
MCHC: 34.2 g/dL (ref 30.0–36.0)
MCV: 87.9 fL (ref 80.0–100.0)
Platelets: 170 10*3/uL (ref 150–400)
RBC: 4.62 MIL/uL (ref 4.22–5.81)
RDW: 14.8 % (ref 11.5–15.5)
WBC: 9.8 10*3/uL (ref 4.0–10.5)
nRBC: 0 % (ref 0.0–0.2)

## 2019-10-08 LAB — GLUCOSE, CAPILLARY
Glucose-Capillary: 144 mg/dL — ABNORMAL HIGH (ref 70–99)
Glucose-Capillary: 149 mg/dL — ABNORMAL HIGH (ref 70–99)
Glucose-Capillary: 165 mg/dL — ABNORMAL HIGH (ref 70–99)
Glucose-Capillary: 169 mg/dL — ABNORMAL HIGH (ref 70–99)
Glucose-Capillary: 280 mg/dL — ABNORMAL HIGH (ref 70–99)
Glucose-Capillary: 336 mg/dL — ABNORMAL HIGH (ref 70–99)

## 2019-10-08 LAB — POCT ACTIVATED CLOTTING TIME: Activated Clotting Time: 285 seconds

## 2019-10-08 LAB — TROPONIN I (HIGH SENSITIVITY): Troponin I (High Sensitivity): 10336 ng/L (ref <18)

## 2019-10-08 LAB — HEPARIN LEVEL (UNFRACTIONATED): Heparin Unfractionated: 0.4 IU/mL (ref 0.30–0.70)

## 2019-10-08 SURGERY — LEFT HEART CATH AND CORONARY ANGIOGRAPHY
Anesthesia: Moderate Sedation

## 2019-10-08 MED ORDER — ENOXAPARIN SODIUM 40 MG/0.4ML ~~LOC~~ SOLN
40.0000 mg | SUBCUTANEOUS | Status: DC
  Administered 2019-10-09 – 2019-10-11 (×3): 40 mg via SUBCUTANEOUS
  Filled 2019-10-08 (×3): qty 0.4

## 2019-10-08 MED ORDER — PRASUGREL HCL 10 MG PO TABS
ORAL_TABLET | ORAL | Status: DC | PRN
  Administered 2019-10-08: 60 mg via ORAL

## 2019-10-08 MED ORDER — PRASUGREL HCL 10 MG PO TABS
10.0000 mg | ORAL_TABLET | Freq: Every day | ORAL | Status: DC
  Administered 2019-10-09 – 2019-10-10 (×2): 10 mg via ORAL
  Filled 2019-10-08 (×4): qty 1

## 2019-10-08 MED ORDER — SODIUM CHLORIDE 0.9% FLUSH: 3.0000 mL | INTRAVENOUS | Status: DC | PRN

## 2019-10-08 MED ORDER — FAMOTIDINE 20 MG PO TABS: 40.0000 mg | ORAL_TABLET | Freq: Once | ORAL | Status: AC

## 2019-10-08 MED ORDER — PRASUGREL HCL 10 MG PO TABS
ORAL_TABLET | ORAL | Status: AC
  Filled 2019-10-08: qty 6

## 2019-10-08 MED ORDER — KETOROLAC TROMETHAMINE 15 MG/ML IJ SOLN
15.0000 mg | Freq: Once | INTRAMUSCULAR | Status: AC
  Administered 2019-10-08: 15 mg via INTRAVENOUS
  Filled 2019-10-08: qty 1

## 2019-10-08 MED ORDER — MIDAZOLAM HCL 2 MG/2ML IJ SOLN
INTRAMUSCULAR | Status: DC | PRN
  Administered 2019-10-08: 1 mg via INTRAVENOUS

## 2019-10-08 MED ORDER — DIPHENHYDRAMINE HCL 50 MG/ML IJ SOLN: 50.0000 mg | Freq: Once | INTRAMUSCULAR | Status: AC

## 2019-10-08 MED ORDER — EZETIMIBE 10 MG PO TABS
10.0000 mg | ORAL_TABLET | Freq: Every day | ORAL | Status: DC
  Administered 2019-10-08 – 2019-10-11 (×4): 10 mg via ORAL
  Filled 2019-10-08 (×5): qty 1

## 2019-10-08 MED ORDER — HYDROMORPHONE HCL 1 MG/ML IJ SOLN
INTRAMUSCULAR | Status: AC
  Filled 2019-10-08: qty 1

## 2019-10-08 MED ORDER — HEPARIN SODIUM (PORCINE) 1000 UNIT/ML IJ SOLN
INTRAMUSCULAR | Status: AC
  Filled 2019-10-08: qty 1

## 2019-10-08 MED ORDER — DIPHENHYDRAMINE HCL 50 MG/ML IJ SOLN
INTRAMUSCULAR | Status: AC
  Administered 2019-10-08: 50 mg via INTRAVENOUS
  Filled 2019-10-08: qty 1

## 2019-10-08 MED ORDER — HEPARIN (PORCINE) IN NACL 1000-0.9 UT/500ML-% IV SOLN
INTRAVENOUS | Status: AC
  Filled 2019-10-08: qty 1000

## 2019-10-08 MED ORDER — SODIUM CHLORIDE 0.9 % IV SOLN: 250.0000 mL | INTRAVENOUS | Status: DC | PRN

## 2019-10-08 MED ORDER — HEPARIN SODIUM (PORCINE) 1000 UNIT/ML IJ SOLN
INTRAMUSCULAR | Status: DC | PRN
  Administered 2019-10-08 (×2): 5000 [IU] via INTRAVENOUS

## 2019-10-08 MED ORDER — SACUBITRIL-VALSARTAN 49-51 MG PO TABS
1.0000 | ORAL_TABLET | Freq: Two times a day (BID) | ORAL | Status: DC
  Administered 2019-10-08 – 2019-10-11 (×7): 1 via ORAL
  Filled 2019-10-08 (×8): qty 1

## 2019-10-08 MED ORDER — SPIRONOLACTONE 25 MG PO TABS
25.0000 mg | ORAL_TABLET | Freq: Every day | ORAL | Status: DC
  Administered 2019-10-08 – 2019-10-11 (×4): 25 mg via ORAL
  Filled 2019-10-08 (×5): qty 1

## 2019-10-08 MED ORDER — FUROSEMIDE 40 MG PO TABS
40.0000 mg | ORAL_TABLET | Freq: Two times a day (BID) | ORAL | Status: DC
  Administered 2019-10-08 – 2019-10-11 (×6): 40 mg via ORAL
  Filled 2019-10-08 (×7): qty 1

## 2019-10-08 MED ORDER — FENTANYL CITRATE (PF) 100 MCG/2ML IJ SOLN
INTRAMUSCULAR | Status: DC | PRN
  Administered 2019-10-08: 50 ug via INTRAVENOUS
  Administered 2019-10-08: 25 ug via INTRAVENOUS

## 2019-10-08 MED ORDER — FAMOTIDINE 20 MG PO TABS
ORAL_TABLET | ORAL | Status: AC
  Administered 2019-10-08: 09:00:00 40 mg via ORAL
  Filled 2019-10-08: qty 2

## 2019-10-08 MED ORDER — VERAPAMIL HCL 2.5 MG/ML IV SOLN
INTRAVENOUS | Status: AC
  Filled 2019-10-08: qty 2

## 2019-10-08 MED ORDER — HYDROMORPHONE HCL 1 MG/ML IJ SOLN
1.0000 mg | INTRAMUSCULAR | Status: DC | PRN
  Administered 2019-10-08 – 2019-10-11 (×4): 1 mg via INTRAVENOUS
  Filled 2019-10-08 (×3): qty 1

## 2019-10-08 MED ORDER — FENTANYL CITRATE (PF) 100 MCG/2ML IJ SOLN
INTRAMUSCULAR | Status: AC
  Filled 2019-10-08: qty 2

## 2019-10-08 MED ORDER — SODIUM CHLORIDE 0.9 % IV SOLN: INTRAVENOUS | Status: AC

## 2019-10-08 MED ORDER — METHYLPREDNISOLONE SODIUM SUCC 125 MG IJ SOLR: 125.0000 mg | Freq: Once | INTRAMUSCULAR | Status: AC

## 2019-10-08 MED ORDER — MIDAZOLAM HCL 2 MG/2ML IJ SOLN
INTRAMUSCULAR | Status: AC
  Filled 2019-10-08: qty 2

## 2019-10-08 MED ORDER — SODIUM CHLORIDE 0.9% FLUSH
3.0000 mL | Freq: Two times a day (BID) | INTRAVENOUS | Status: DC
  Administered 2019-10-08 – 2019-10-11 (×7): 3 mL via INTRAVENOUS

## 2019-10-08 MED ORDER — DIPHENHYDRAMINE HCL 25 MG PO CAPS: 50.0000 mg | ORAL_CAPSULE | Freq: Once | ORAL | Status: AC

## 2019-10-08 MED ORDER — HEPARIN (PORCINE) IN NACL 1000-0.9 UT/500ML-% IV SOLN
INTRAVENOUS | Status: DC | PRN
  Administered 2019-10-08: 1000 mL

## 2019-10-08 MED ORDER — VERAPAMIL HCL 2.5 MG/ML IV SOLN
INTRAVENOUS | Status: DC | PRN
  Administered 2019-10-08: 2.5 mg via INTRA_ARTERIAL

## 2019-10-08 MED ORDER — PRASUGREL HCL 10 MG PO TABS
ORAL_TABLET | ORAL | Status: AC
  Filled 2019-10-08: qty 1

## 2019-10-08 MED ORDER — IOHEXOL 300 MG/ML  SOLN
INTRAMUSCULAR | Status: DC | PRN
  Administered 2019-10-08: 155 mL

## 2019-10-08 MED ORDER — METHYLPREDNISOLONE SODIUM SUCC 125 MG IJ SOLR
INTRAMUSCULAR | Status: AC
  Administered 2019-10-08: 125 mg via INTRAVENOUS
  Filled 2019-10-08: qty 2

## 2019-10-08 SURGICAL SUPPLY — 16 items
BALLN MINITREK RX 2.0X12 (BALLOONS) ×2
BALLN ~~LOC~~ TREK RX 3.0X12 (BALLOONS) ×2
BALLOON MINITREK RX 2.0X12 (BALLOONS) IMPLANT
BALLOON ~~LOC~~ TREK RX 3.0X12 (BALLOONS) IMPLANT
CATH INFINITI JR4 5F (CATHETERS) ×1 IMPLANT
CATH LAUNCHER 6FR EBU3.5 (CATHETERS) ×1 IMPLANT
DEVICE INFLAT 30 PLUS (MISCELLANEOUS) ×1 IMPLANT
DEVICE RAD TR BAND REGULAR (VASCULAR PRODUCTS) ×1 IMPLANT
GLIDESHEATH SLEND SS 6F .021 (SHEATH) ×1 IMPLANT
GUIDEWIRE INQWIRE 1.5J.035X260 (WIRE) IMPLANT
INQWIRE 1.5J .035X260CM (WIRE) ×2
KIT MANI 3VAL PERCEP (MISCELLANEOUS) ×2 IMPLANT
PACK CARDIAC CATH (CUSTOM PROCEDURE TRAY) ×2 IMPLANT
STENT RESOLUTE ONYX 2.75X15 (Permanent Stent) ×1 IMPLANT
WIRE G INSERTION TOOL (WIRE) ×1 IMPLANT
WIRE INTUITION PROPEL ST 180CM (WIRE) ×1 IMPLANT

## 2019-10-08 NOTE — Progress Notes (Signed)
Per pharmacy Goal of Therapy:  Heparin level 0.3-0.7 units/ml  10-08-19 at 0238: Heparin 0.40 units/ml

## 2019-10-08 NOTE — Progress Notes (Addendum)
Progress Note    [name].  MVH:[id number] DOB: 11-Sep-1955  DOA: 10/06/2019 PCP: [name], MD      Brief Narrative:    Medical records reviewed and are as summarized below:  [name]. is an 64 y.o. male  with medical history significant for coronary artery disease status post MI x3 with recent cardiac cath in February 2021 showing patent stents and severe triple-vessel disease managed with aggressive medical management, as well as history of systolic heart failure EF 25 to 35% on recent cath, secondary to ischemic cardiomyopathy, hypertension and HLD who presents to the emergency room with onset of typical chest pain, retrosternal radiating to the back, throat and left arm of intensity 10 out of 10, similar and somewhat worse than his prior heart attack.  It was associated with diaphoresis and nausea.  Troponins were significantly elevated to 10,336.  He was diagnosed with acute NSTEMI.  He was treated with IV heparin infusion.  He was seen in consultation by the cardiologist.  He underwent left heart catheterization and a drug-eluting stent was placed to the left circumflex.  Dual antiplatelet therapy with aspirin and prasugrel for at least 1 year was recommended.  Hemoglobin A1c was 7.1 consistent with type 2 diabetes mellitus.      Assessment/Plan:   Principal Problem:   NSTEMI (non-ST elevated myocardial infarction) (HCC) Active Problems:   Unstable angina (HCC)   CAD (coronary artery disease)   Essential hypertension   Hyperlipidemia   Chronic systolic heart failure (HCC)   Ischemic cardiomyopathy   Type 2 diabetes mellitus with hyperglycemia (HCC)   Chest pain, acute NSTEMI, headache and elevated troponins   CAD (coronary artery disease) s/p MI x3, hx of stents S/p left heart cath with placement of drug-eluting stent to left circumflex. Dual antiplatelet therapy with aspirin and prasugrel. Patient intolerant to statins.  Follow-up with cardiologist  for further recommendations.     Essential hypertension -Continue carvedilol as able  Type 2 diabetes mellitus Hemoglobin A1c 7.1.  NovoLog as needed for hyperglycemia.    Hyperlipidemia -Continue Zetia    Chronic systolic heart failure (HCC)    Ischemic cardiomyopathy -Continue Lasix, Entresto and Aldactone and Coreg  Acute on chronic low back pain Patient was given IV fentanyl and IV Dilaudid for severe low back pain at the recovery room in the Cath Lab. He had a laminectomy in October 2021   Body mass index is 34.93 kg/m.  (Obesity)   Family Communication/Anticipated D/C date and plan/Code Status   DVT prophylaxis: Lovenox Code Status: Full code Family Communication: Plan discussed with the patient Disposition Plan: Patient is from home.  Plan to discharge him home tomorrow.     Subjective:   C/o is severe low back pain.  He had low back surgery in October 2021.  Chest pain has resolved.  Headache is improved.  Objective:    Vitals:   10/08/19 1330 10/08/19 1400 10/08/19 1424 10/08/19 1439  BP: 135/82 135/86  (!) 152/84  Pulse: (!) 54 (!) 56 (!) 58 65  Resp: 13 16 20 18   Temp:    98.3 F (36.8 C)  TempSrc:    Oral  SpO2: 96% 92% 92% 95%  Weight:      Height:        Intake/Output Summary (Last 24 hours) at 10/08/2019 1531 Last data filed at 10/08/2019 1351 Gross per 24 hour  Intake 533.62 ml  Output 1075 ml  Net -  541.38 ml   Filed Weights   10/06/19 1833 10/07/19 0053 10/08/19 1610  Weight: 98.4 kg 95.8 kg 98.2 kg    Exam:  GEN: NAD SKIN: No rash EYES: EOMI ENT: MMM CV: RRR PULM: CTA B ABD: soft, ND, NT, +BS CNS: AAO x 3, non focal EXT: No edema or tenderness   Data Reviewed:   I have personally reviewed following labs and imaging studies:  Labs: Labs show the following:   Basic Metabolic Panel: Recent Labs  Lab 10/06/19 1835 10/07/19 0426  NA 139 139  K 3.5 3.6  CL 105 105  CO2 21* 23  GLUCOSE 296* 194*  BUN 11 11   CREATININE 0.86 0.84  CALCIUM 9.4 9.0   GFR Estimated Creatinine Clearance: 98.8 mL/min (by C-G formula based on SCr of 0.84 mg/dL). Liver Function Tests: No results for input(s): AST, ALT, ALKPHOS, BILITOT, PROT, ALBUMIN in the last 168 hours. No results for input(s): LIPASE, AMYLASE in the last 168 hours. No results for input(s): AMMONIA in the last 168 hours. Coagulation profile Recent Labs  Lab 10/06/19 1836  INR 1.1    CBC: Recent Labs  Lab 10/06/19 1835 10/07/19 0426 10/08/19 0238  WBC 11.8* 9.1 9.8  HGB 16.6 15.2 13.9  HCT 45.4 42.4 40.6  MCV 83.0 85.1 87.9  PLT 238 212 170   Cardiac Enzymes: No results for input(s): CKTOTAL, CKMB, CKMBINDEX, TROPONINI in the last 168 hours. BNP (last 3 results) No results for input(s): PROBNP in the last 8760 hours. CBG: Recent Labs  Lab 10/07/19 2352 10/08/19 0535 10/08/19 0808 10/08/19 0829 10/08/19 1009  GLUCAP 156* 169* 144* 149* 165*   D-Dimer: No results for input(s): DDIMER in the last 72 hours. Hgb A1c: Recent Labs    10/07/19 0426  HGBA1C 7.1*   Lipid Profile: Recent Labs    10/07/19 0426  CHOL 139  HDL 42  LDLCALC 71  TRIG 132  CHOLHDL 3.3   Thyroid function studies: No results for input(s): TSH, T4TOTAL, T3FREE, THYROIDAB in the last 72 hours.  Invalid input(s): FREET3 Anemia work up: No results for input(s): VITAMINB12, FOLATE, FERRITIN, TIBC, IRON, RETICCTPCT in the last 72 hours. Sepsis Labs: Recent Labs  Lab 10/06/19 1835 10/07/19 0426 10/08/19 0238  WBC 11.8* 9.1 9.8    Microbiology Recent Results (from the past 240 hour(s))  Respiratory Panel by RT PCR (Flu A&B, Covid) - Nasopharyngeal Swab     Status: None   Collection Time: 10/06/19 11:38 PM   Specimen: Nasopharyngeal Swab  Result Value Ref Range Status   SARS Coronavirus 2 by RT PCR NEGATIVE NEGATIVE Final    Comment: (NOTE) SARS-CoV-2 target nucleic acids are NOT DETECTED. The SARS-CoV-2 RNA is generally detectable in  upper respiratoy specimens during the acute phase of infection. The lowest concentration of SARS-CoV-2 viral copies this assay can detect is 131 copies/mL. A negative result does not preclude SARS-Cov-2 infection and should not be used as the sole basis for treatment or other patient management decisions. A negative result may occur with  improper specimen collection/handling, submission of specimen other than nasopharyngeal swab, presence of viral mutation(s) within the areas targeted by this assay, and inadequate number of viral copies (<131 copies/mL). A negative result must be combined with clinical observations, patient history, and epidemiological information. The expected result is Negative. Fact Sheet for Patients:  https://www.moore.com/ Fact Sheet for Healthcare Providers:  https://www.young.biz/ This test is not yet ap proved or cleared by the [place] and  has been authorized for detection and/or diagnosis of SARS-CoV-2 by FDA under an Emergency Use Authorization (EUA). This EUA will remain  in effect (meaning this test can be used) for the duration of the COVID-19 declaration under Section 564(b)(1) of the Act, 21 U.S.C. section 360bbb-3(b)(1), unless the authorization is terminated or revoked sooner.    Influenza A by PCR NEGATIVE NEGATIVE Final   Influenza B by PCR NEGATIVE NEGATIVE Final    Comment: (NOTE) The Xpert Xpress SARS-CoV-2/FLU/RSV assay is intended as an aid in  the diagnosis of influenza from Nasopharyngeal swab specimens and  should not be used as a sole basis for treatment. Nasal washings and  aspirates are unacceptable for Xpert Xpress SARS-CoV-2/FLU/RSV  testing. Fact Sheet for Patients: https://www.moore.com/ Fact Sheet for Healthcare Providers: https://www.young.biz/ This test is not yet approved or cleared by the [place] FDA and  has been authorized for  detection and/or diagnosis of SARS-CoV-2 by  FDA under an Emergency Use Authorization (EUA). This EUA will remain  in effect (meaning this test can be used) for the duration of the  Covid-19 declaration under Section 564(b)(1) of the Act, 21  U.S.C. section 360bbb-3(b)(1), unless the authorization is  terminated or revoked. Performed at [facility], [address] [place], [place] [address]     Procedures and diagnostic studies:  DG Chest 2 View  Result Date: 10/06/2019 CLINICAL DATA:  Chest pain EXAM: CHEST - 2 VIEW COMPARISON:  2016 FINDINGS: The heart size and mediastinal contours are within normal limits. Both lungs are clear. No pleural effusion or pneumothorax. No acute osseous abnormality. IMPRESSION: No acute process in the chest. Electronically Signed   By: Guadlupe Spanish [name]   On: 10/06/2019 19:17   CT Head Wo Contrast  Result Date: 10/06/2019 CLINICAL DATA:  Acute headache. Severe headache with facial tightness, pain/numbness in left arm. EXAM: CT HEAD WITHOUT CONTRAST TECHNIQUE: Contiguous axial images were obtained from the base of the skull through the vertex without intravenous contrast. COMPARISON:  None. FINDINGS: Brain: Generalized cerebral atrophy. Moderate periventricular and deep white matter hypodensity most consistent with chronic small vessel ischemia. No evidence of acute infarction, hemorrhage, hydrocephalus, extra-axial collection or mass lesion/mass effect. Vascular: No hyperdense vessel. Skull: No fracture or focal lesion. Sinuses/Orbits: Mucosal thickening of left side of sphenoid sinus with mucous retention cyst. Mucosal thickening of left maxillary sinus with heterogeneous debris. Mild ethmoid sinus mucosal thickening on the left. Prior right cataract resection. Mastoid air cells are clear. Other: None. IMPRESSION: 1. Generalized atrophy and chronic small vessel ischemia. No acute intracranial abnormality. 2. Left paranasal sinus disease. Electronically  Signed   By: Narda Rutherford [name]   On: 10/06/2019 19:59   CARDIAC CATHETERIZATION  Result Date: 10/08/2019  1st Diag lesion is 60% stenosed.  Prox LAD to Mid LAD lesion is 10% stenosed.  2nd Diag lesion is 100% stenosed.  1st Mrg lesion is 100% stenosed.  Mid Cx to Dist Cx lesion is 100% stenosed.  Prox RCA to Mid RCA lesion is 100% stenosed.  Dist RCA lesion is 70% stenosed.  2nd Mrg lesion is 60% stenosed.  Post intervention, there is a 0% residual stenosis.  A drug-eluting stent was successfully placed using a STENT RESOLUTE ONYX [id number].  1.  Significant underlying three-vessel coronary artery disease with patent stents in the LAD and diagonal with moderate in-stent restenosis in the diagonal stent.  Chronically occluded RCA stents with right to right bridging and left-to-right collaterals.  Diffuse small vessel disease.  The mid/distal left circumflex which was significantly diseased recently is now completely occluded which is the likely culprit for myocardial infarction.  This supplies relatively small size OM 3 which has moderate diffuse atherosclerosis. 2.  Left ventricular angiography was not performed.  EF was 25 to 30% by echo. 3.  Severely elevated left ventricular end-diastolic pressure at 33 mmHg 4.  Successful angioplasty and drug-eluting stent placement to the left circumflex.  Recommendations: I elected to intervene on the left circumflex given that troponin was still rising and the patient with residual chest pain. Dual antiplatelet therapy for at least 1 year. Resume heart failure medications. Resume furosemide as the patient is significantly volume overloaded. Possible discharge home tomorrow.  ECHOCARDIOGRAM COMPLETE  Result Date: 10/07/2019    ECHOCARDIOGRAM REPORT   Patient Name:   [name]. Date of Exam: 10/07/2019 Medical Rec #:  [id number]         Height:       66.0 in Accession #:    [id number]        Weight:       211.3 lb Date of Birth:  10/06/1955          BSA:           2.047 m Patient Age:    64 years          BP:           125/50 mmHg Patient Gender: M                 HR:           60 bpm. Exam Location:  [facility] Procedure: 2D Echo and Intracardiac Opacification Agent Indications:     CAD Native Vessel 414.01/ I25.10  History:         Patient has prior history of Echocardiogram examinations, most                  recent 06/07/2015.  Sonographer:     Wonda Cerise RDCS Referring Phys:  [id number] Andris Baumann Diagnosing Phys: Julien Nordmann MD  Sonographer Comments: Technically difficult study due to poor echo windows. IMPRESSIONS  1. Left ventricular ejection fraction, by estimation, is 25 to 30%. The left ventricle has severely decreased function. The left ventricle demonstrates global hypokinesis with severe hypokinesis of the inferior wall and inferoapical region. The left ventricular internal cavity size was moderately dilated. Left ventricular diastolic parameters are indeterminate.  2. Right ventricular systolic function is normal. The right ventricular size is normal. Tricuspid regurgitation signal is inadequate for assessing PA pressure.  3. Left atrial size was mild to moderately dilated. FINDINGS  Left Ventricle: Left ventricular ejection fraction, by estimation, is 25 to 30%. The left ventricle has severely decreased function. The left ventricle demonstrates global hypokinesis. Definity contrast agent was given IV to delineate the left ventricular endocardial borders. The left ventricular internal cavity size was moderately dilated. There is no left ventricular hypertrophy. Left ventricular diastolic parameters are indeterminate. Right Ventricle: The right ventricular size is normal. No increase in right ventricular wall thickness. Right ventricular systolic function is normal. Tricuspid regurgitation signal is inadequate for assessing PA pressure. Left Atrium: Left atrial size was mild to moderately dilated. Right Atrium: Right atrial size was normal in size.  Pericardium: There is no evidence of pericardial effusion. Mitral Valve: The mitral valve is normal in structure. Normal mobility of the mitral valve leaflets. Trivial mitral valve regurgitation. No evidence of mitral valve stenosis. Tricuspid Valve:  The tricuspid valve is normal in structure. Tricuspid valve regurgitation is not demonstrated. No evidence of tricuspid stenosis. Aortic Valve: The aortic valve was not well visualized. Aortic valve regurgitation is not visualized. No aortic stenosis is present. Aortic valve peak gradient measures 4.8 mmHg. Pulmonic Valve: The pulmonic valve was normal in structure. Pulmonic valve regurgitation is not visualized. No evidence of pulmonic stenosis. Aorta: The aortic root is normal in size and structure. Venous: The inferior vena cava is normal in size with greater than 50% respiratory variability, suggesting right atrial pressure of 3 mmHg. IAS/Shunts: No atrial level shunt detected by color flow Doppler.  LEFT VENTRICLE PLAX 2D LVIDd:         5.20 cm      Diastology LVIDs:         4.46 cm      LV e' lateral:   2.83 cm/s LV PW:         1.28 cm      LV E/e' lateral: 18.7 LV IVS:        2.19 cm      LV e' medial:    3.05 cm/s LVOT diam:     2.10 cm      LV E/e' medial:  17.3 LV SV:         58 LV SV Index:   28 LVOT Area:     3.46 cm  LV Volumes (MOD) LV vol d, MOD A4C: 236.0 ml LV vol s, MOD A4C: 175.5 ml LV SV MOD A4C:     236.0 ml RIGHT VENTRICLE RV Basal diam:  3.68 cm RV S prime:     22.40 cm/s TAPSE (M-mode): 2.9 cm LEFT ATRIUM              Index       RIGHT ATRIUM           Index LA diam:        4.80 cm  2.34 cm/m  RA Area:     15.60 cm LA Vol (A2C):   107.0 ml 52.27 ml/m RA Volume:   39.00 ml  19.05 ml/m LA Vol (A4C):   49.5 ml  24.18 ml/m LA Biplane Vol: 73.7 ml  36.00 ml/m  AORTIC VALVE                PULMONIC VALVE AV Area (Vmax): 2.51 cm    PV Vmax:       0.88 m/s AV Vmax:        110.00 cm/s PV Peak grad:  3.1 mmHg AV Peak Grad:   4.8 mmHg LVOT Vmax:       79.80 cm/s LVOT Vmean:     53.400 cm/s LVOT VTI:       0.168 m  AORTA Ao Root diam: 3.50 cm Ao Asc diam:  3.30 cm MITRAL VALVE MV Area (PHT): 3.12 cm    SHUNTS MV Decel Time: 243 msec    Systemic VTI:  0.17 m MV E velocity: 52.90 cm/s  Systemic Diam: 2.10 cm MV A velocity: 67.90 cm/s MV E/A ratio:  0.78 Julien Nordmann MD Electronically signed by Julien Nordmann MD Signature Date/Time: 10/07/2019/1:59:05 PM    Final     Medications:   . aspirin EC  81 mg Oral Daily  . carvedilol  6.25 mg Oral BID WC  . [START ON 10/09/2019] enoxaparin (LOVENOX) injection  40 mg Subcutaneous Q24H  . ezetimibe  10 mg Oral Daily  . furosemide  40 mg Oral BID  .  HYDROmorphone      . insulin aspart  0-9 Units Subcutaneous Q6H  . [START ON 10/09/2019] prasugrel  10 mg Oral Daily  . sacubitril-valsartan  1 tablet Oral BID  . sodium chloride flush  3 mL Intravenous Q12H  . spironolactone  25 mg Oral Daily   Continuous Infusions: . sodium chloride    . sodium chloride       LOS: 2 days   [name]  [facility]     10/08/2019, 3:31 PM                Progress Note    [name].  ZOX:[id number] DOB: February 14, 1956  DOA: 10/06/2019 PCP: [name], MD      Brief Narrative:    Medical records reviewed and are as summarized below:  [name]. is an 64 y.o. male  with medical history significant for coronary artery disease status post MI x3 with recent cardiac cath in February 2021 showing patent stents and severe triple-vessel disease managed with aggressive medical management, as well as history of systolic heart failure EF 25 to 35% on recent cath, secondary to ischemic cardiomyopathy, hypertension and HLD who presents to the emergency room with onset of typical chest pain, retrosternal radiating to the back, throat and left arm of intensity 10 out of 10, similar and somewhat worse than his prior heart attack.  It was associated with diaphoresis and  nausea.      Assessment/Plan:   Principal Problem:   NSTEMI (non-ST elevated myocardial infarction) (HCC) Active Problems:   Unstable angina (HCC)   CAD (coronary artery disease)   Essential hypertension   Hyperlipidemia   Chronic systolic heart failure (HCC)   Ischemic cardiomyopathy   Type 2 diabetes mellitus with hyperglycemia (HCC)   Acute NSTEMI,    CAD (coronary artery disease) s/p MI x3, hx of stents S/p left heart cath today with placement of drug-eluting stent to left circumflex Aspirin and Brilinta recommended for at least 1 year.  Follow-up with cardiologist for further recommendations.    Essential hypertension -Continue carvedilol as able  Type 2 diabetes mellitus Hemoglobin A1c was 7.1.  NovoLog as needed for hyperglycemia.    Hyperlipidemia -Continue Zetia    Chronic systolic heart failure (HCC)    Ischemic cardiomyopathy -Continue Lasix, Entresto and Aldactone and Coreg  Acute on chronic low back pain  He had to be given fentanyl and IV Dilaudid for severe back pain at the recovery room in the Cath Lab Patient had a laminectomy for lumbar herniated disc on April 30, 2020  Body mass index is 34.93 kg/m.  (Obesity)   Family Communication/Anticipated D/C date and plan/Code Status   DVT prophylaxis: IV heparin Code Status: Full code Family Communication: Plan discussed with the patient Disposition Plan: Patient is from home.  Plan to discharge him home tomorrow    Subjective:   He complains of severe low back pain.  He said he had surgery on his lower back in October last year.  Chest pain has resolved.  Headache has also improved.  Objective:    Vitals:   10/08/19 1330 10/08/19 1400 10/08/19 1424 10/08/19 1439  BP: 135/82 135/86  (!) 152/84  Pulse: (!) 54 (!) 56 (!) 58 65  Resp: Temp:    98.3 F (36.8 C)  TempSrc:    Oral  SpO2: 96% 92% 92% 95%  Weight:      Height:  Intake/Output Summary (Last 24 hours)  at 10/08/2019 1531 Last data filed at 10/08/2019 1351 Gross per 24 hour  Intake 533.62 ml  Output 1075 ml  Net -541.38 ml   Filed Weights   10/06/19 1833 10/07/19 0053 10/08/19 1610  Weight: 98.4 kg 95.8 kg 98.2 kg    Exam:  GEN: NAD SKIN: Warm and dry EYES: EOMI ENT: MMM CV: RRR PULM: No wheezing or rales heard ABD: soft, ND, NT, +BS CNS: AAO x 3, non focal EXT: No edema or tenderness   Data Reviewed:   I have personally reviewed following labs and imaging studies:  Labs: Labs show the following:   Basic Metabolic Panel: Recent Labs  Lab 10/06/19 1835 10/07/19 0426  NA 139 139  K 3.5 3.6  CL 105 105  CO2 21* 23  GLUCOSE 296* 194*  BUN 11 11  CREATININE 0.86 0.84  CALCIUM 9.4 9.0   GFR Estimated Creatinine Clearance: 98.8 mL/min (by C-G formula based on SCr of 0.84 mg/dL). Liver Function Tests: No results for input(s): AST, ALT, ALKPHOS, BILITOT, PROT, ALBUMIN in the last 168 hours. No results for input(s): LIPASE, AMYLASE in the last 168 hours. No results for input(s): AMMONIA in the last 168 hours. Coagulation profile Recent Labs  Lab 10/06/19 1836  INR 1.1    CBC: Recent Labs  Lab 10/06/19 1835 10/07/19 0426 10/08/19 0238  WBC 11.8* 9.1 9.8  HGB 16.6 15.2 13.9  HCT 45.4 42.4 40.6  MCV 83.0 85.1 87.9  PLT 238 212 170   Cardiac Enzymes: No results for input(s): CKTOTAL, CKMB, CKMBINDEX, TROPONINI in the last 168 hours. BNP (last 3 results) No results for input(s): PROBNP in the last 8760 hours. CBG: Recent Labs  Lab 10/07/19 2352 10/08/19 0535 10/08/19 0808 10/08/19 0829 10/08/19 1009  GLUCAP 156* 169* 144* 149* 165*   D-Dimer: No results for input(s): DDIMER in the last 72 hours. Hgb A1c: Recent Labs    10/07/19 0426  HGBA1C 7.1*   Lipid Profile: Recent Labs    10/07/19 0426  CHOL 139  HDL 42  LDLCALC 71  TRIG 132  CHOLHDL 3.3   Thyroid function studies: No results for input(s): TSH, T4TOTAL, T3FREE, THYROIDAB in  the last 72 hours.  Invalid input(s): FREET3 Anemia work up: No results for input(s): VITAMINB12, FOLATE, FERRITIN, TIBC, IRON, RETICCTPCT in the last 72 hours. Sepsis Labs: Recent Labs  Lab 10/06/19 1835 10/07/19 0426 10/08/19 0238  WBC 11.8* 9.1 9.8    Microbiology Recent Results (from the past 240 hour(s))  Respiratory Panel by RT PCR (Flu A&B, Covid) - Nasopharyngeal Swab     Status: None   Collection Time: 10/06/19 11:38 PM   Specimen: Nasopharyngeal Swab  Result Value Ref Range Status   SARS Coronavirus 2 by RT PCR NEGATIVE NEGATIVE Final    Comment: (NOTE) SARS-CoV-2 target nucleic acids are NOT DETECTED. The SARS-CoV-2 RNA is generally detectable in upper respiratoy specimens during the acute phase of infection. The lowest concentration of SARS-CoV-2 viral copies this assay can detect is 131 copies/mL. A negative result does not preclude SARS-Cov-2 infection and should not be used as the sole basis for treatment or other patient management decisions. A negative result may occur with  improper specimen collection/handling, submission of specimen other than nasopharyngeal swab, presence of viral mutation(s) within the areas targeted by this assay, and inadequate number of viral copies (<131 copies/mL). A negative result must be combined with clinical observations, patient history, and epidemiological information. The expected  result is Negative. Fact Sheet for Patients:  https://www.moore.com/ Fact Sheet for Healthcare Providers:  https://www.young.biz/ This test is not yet ap proved or cleared by the [place] FDA and  has been authorized for detection and/or diagnosis of SARS-CoV-2 by FDA under an Emergency Use Authorization (EUA). This EUA will remain  in effect (meaning this test can be used) for the duration of the COVID-19 declaration under Section 564(b)(1) of the Act, 21 U.S.C. section 360bbb-3(b)(1), unless the  authorization is terminated or revoked sooner.    Influenza A by PCR NEGATIVE NEGATIVE Final   Influenza B by PCR NEGATIVE NEGATIVE Final    Comment: (NOTE) The Xpert Xpress SARS-CoV-2/FLU/RSV assay is intended as an aid in  the diagnosis of influenza from Nasopharyngeal swab specimens and  should not be used as a sole basis for treatment. Nasal washings and  aspirates are unacceptable for Xpert Xpress SARS-CoV-2/FLU/RSV  testing. Fact Sheet for Patients: https://www.moore.com/ Fact Sheet for Healthcare Providers: https://www.young.biz/ This test is not yet approved or cleared by the [place] FDA and  has been authorized for detection and/or diagnosis of SARS-CoV-2 by  FDA under an Emergency Use Authorization (EUA). This EUA will remain  in effect (meaning this test can be used) for the duration of the  Covid-19 declaration under Section 564(b)(1) of the Act, 21  U.S.C. section 360bbb-3(b)(1), unless the authorization is  terminated or revoked. Performed at [facility], [address] [place], [place] [address]     Procedures and diagnostic studies:  DG Chest 2 View  Result Date: 10/06/2019 CLINICAL DATA:  Chest pain EXAM: CHEST - 2 VIEW COMPARISON:  2016 FINDINGS: The heart size and mediastinal contours are within normal limits. Both lungs are clear. No pleural effusion or pneumothorax. No acute osseous abnormality. IMPRESSION: No acute process in the chest. Electronically Signed   By: Guadlupe Spanish [name]   On: 10/06/2019 19:17   CT Head Wo Contrast  Result Date: 10/06/2019 CLINICAL DATA:  Acute headache. Severe headache with facial tightness, pain/numbness in left arm. EXAM: CT HEAD WITHOUT CONTRAST TECHNIQUE: Contiguous axial images were obtained from the base of the skull through the vertex without intravenous contrast. COMPARISON:  None. FINDINGS: Brain: Generalized cerebral atrophy. Moderate periventricular and deep white  matter hypodensity most consistent with chronic small vessel ischemia. No evidence of acute infarction, hemorrhage, hydrocephalus, extra-axial collection or mass lesion/mass effect. Vascular: No hyperdense vessel. Skull: No fracture or focal lesion. Sinuses/Orbits: Mucosal thickening of left side of sphenoid sinus with mucous retention cyst. Mucosal thickening of left maxillary sinus with heterogeneous debris. Mild ethmoid sinus mucosal thickening on the left. Prior right cataract resection. Mastoid air cells are clear. Other: None. IMPRESSION: 1. Generalized atrophy and chronic small vessel ischemia. No acute intracranial abnormality. 2. Left paranasal sinus disease. Electronically Signed   By: Narda Rutherford [name]   On: 10/06/2019 19:59   CARDIAC CATHETERIZATION  Result Date: 10/08/2019  1st Diag lesion is 60% stenosed.  Prox LAD to Mid LAD lesion is 10% stenosed.  2nd Diag lesion is 100% stenosed.  1st Mrg lesion is 100% stenosed.  Mid Cx to Dist Cx lesion is 100% stenosed.  Prox RCA to Mid RCA lesion is 100% stenosed.  Dist RCA lesion is 70% stenosed.  2nd Mrg lesion is 60% stenosed.  Post intervention, there is a 0% residual stenosis.  A drug-eluting stent was successfully placed using a STENT RESOLUTE ONYX [id number].  1.  Significant underlying three-vessel coronary artery disease with patent  stents in the LAD and diagonal with moderate in-stent restenosis in the diagonal stent.  Chronically occluded RCA stents with right to right bridging and left-to-right collaterals.  Diffuse small vessel disease.    The mid/distal left circumflex which was significantly diseased recently is now completely occluded which is the likely culprit for myocardial infarction.  This supplies relatively small size OM 3 which has moderate diffuse atherosclerosis. 2.  Left ventricular angiography was not performed.  EF was 25 to 30% by echo. 3.  Severely elevated left ventricular end-diastolic pressure at 33 mmHg 4.   Successful angioplasty and drug-eluting stent placement to the left circumflex.  Recommendations: I elected to intervene on the left circumflex given that troponin was still rising and the patient with residual chest pain. Dual antiplatelet therapy for at least 1 year. Resume heart failure medications. Resume furosemide as the patient is significantly volume overloaded. Possible discharge home tomorrow.  ECHOCARDIOGRAM COMPLETE  Result Date: 10/07/2019    ECHOCARDIOGRAM REPORT   Patient Name:   [name]. Date of Exam: 10/07/2019 Medical Rec #:  [id number]         Height:       66.0 in Accession #:    [id number]        Weight:       211.3 lb Date of Birth:  1956-04-15          BSA:          2.047 m Patient Age:    64 years          BP:           125/50 mmHg Patient Gender: M                 HR:           60 bpm. Exam Location:  [facility] Procedure: 2D Echo and Intracardiac Opacification Agent Indications:     CAD Native Vessel 414.01/ I25.10  History:         Patient has prior history of Echocardiogram examinations, most                  recent 06/07/2015.  Sonographer:     Wonda Cerise RDCS Referring Phys:  [id number] Andris Baumann Diagnosing Phys: Julien Nordmann MD  Sonographer Comments: Technically difficult study due to poor echo windows. IMPRESSIONS  1. Left ventricular ejection fraction, by estimation, is 25 to 30%. The left ventricle has severely decreased function. The left ventricle demonstrates global hypokinesis with severe hypokinesis of the inferior wall and inferoapical region. The left ventricular internal cavity size was moderately dilated. Left ventricular diastolic parameters are indeterminate.  2. Right ventricular systolic function is normal. The right ventricular size is normal. Tricuspid regurgitation signal is inadequate for assessing PA pressure.  3. Left atrial size was mild to moderately dilated. FINDINGS  Left Ventricle: Left ventricular ejection fraction, by estimation, is 25 to 30%.  The left ventricle has severely decreased function. The left ventricle demonstrates global hypokinesis. Definity contrast agent was given IV to delineate the left ventricular endocardial borders. The left ventricular internal cavity size was moderately dilated. There is no left ventricular hypertrophy. Left ventricular diastolic parameters are indeterminate. Right Ventricle: The right ventricular size is normal. No increase in right ventricular wall thickness. Right ventricular systolic function is normal. Tricuspid regurgitation signal is inadequate for assessing PA pressure. Left Atrium: Left atrial size was mild to moderately dilated. Right Atrium: Right atrial size was normal in size. Pericardium:  There is no evidence of pericardial effusion. Mitral Valve: The mitral valve is normal in structure. Normal mobility of the mitral valve leaflets. Trivial mitral valve regurgitation. No evidence of mitral valve stenosis. Tricuspid Valve: The tricuspid valve is normal in structure. Tricuspid valve regurgitation is not demonstrated. No evidence of tricuspid stenosis. Aortic Valve: The aortic valve was not well visualized. Aortic valve regurgitation is not visualized. No aortic stenosis is present. Aortic valve peak gradient measures 4.8 mmHg. Pulmonic Valve: The pulmonic valve was normal in structure. Pulmonic valve regurgitation is not visualized. No evidence of pulmonic stenosis. Aorta: The aortic root is normal in size and structure. Venous: The inferior vena cava is normal in size with greater than 50% respiratory variability, suggesting right atrial pressure of 3 mmHg. IAS/Shunts: No atrial level shunt detected by color flow Doppler.  LEFT VENTRICLE PLAX 2D LVIDd:         5.20 cm      Diastology LVIDs:         4.46 cm      LV e' lateral:   2.83 cm/s LV PW:         1.28 cm      LV E/e' lateral: 18.7 LV IVS:        2.19 cm      LV e' medial:    3.05 cm/s LVOT diam:     2.10 cm      LV E/e' medial:  17.3 LV SV:          58 LV SV Index:   28 LVOT Area:     3.46 cm  LV Volumes (MOD) LV vol d, MOD A4C: 236.0 ml LV vol s, MOD A4C: 175.5 ml LV SV MOD A4C:     236.0 ml RIGHT VENTRICLE RV Basal diam:  3.68 cm RV S prime:     22.40 cm/s TAPSE (M-mode): 2.9 cm LEFT ATRIUM              Index       RIGHT ATRIUM           Index LA diam:        4.80 cm  2.34 cm/m  RA Area:     15.60 cm LA Vol (A2C):   107.0 ml 52.27 ml/m RA Volume:   39.00 ml  19.05 ml/m LA Vol (A4C):   49.5 ml  24.18 ml/m LA Biplane Vol: 73.7 ml  36.00 ml/m  AORTIC VALVE                PULMONIC VALVE AV Area (Vmax): 2.51 cm    PV Vmax:       0.88 m/s AV Vmax:        110.00 cm/s PV Peak grad:  3.1 mmHg AV Peak Grad:   4.8 mmHg LVOT Vmax:      79.80 cm/s LVOT Vmean:     53.400 cm/s LVOT VTI:       0.168 m  AORTA Ao Root diam: 3.50 cm Ao Asc diam:  3.30 cm MITRAL VALVE MV Area (PHT): 3.12 cm    SHUNTS MV Decel Time: 243 msec    Systemic VTI:  0.17 m MV E velocity: 52.90 cm/s  Systemic Diam: 2.10 cm MV A velocity: 67.90 cm/s MV E/A ratio:  0.78 Ida Rogue MD Electronically signed by Ida Rogue MD Signature Date/Time: 10/07/2019/1:59:05 PM    Final     Medications:   . aspirin EC  81 mg Oral Daily  . carvedilol  6.25 mg Oral BID WC  . [START ON 10/09/2019] enoxaparin (LOVENOX) injection  40 mg Subcutaneous Q24H  . ezetimibe  10 mg Oral Daily  . furosemide  40 mg Oral BID  . HYDROmorphone      . insulin aspart  0-9 Units Subcutaneous Q6H  . [START ON 10/09/2019] prasugrel  10 mg Oral Daily  . sacubitril-valsartan  1 tablet Oral BID  . sodium chloride flush  3 mL Intravenous Q12H  . spironolactone  25 mg Oral Daily   Continuous Infusions: . sodium chloride    . sodium chloride       LOS: 2 days   [name]  [facility]     10/08/2019, 3:31 PM

## 2019-10-08 NOTE — Interval H&P Note (Signed)
History and Physical Interval Note:  10/08/2019 9:00 AM  [name].  has presented today for surgery, with the diagnosis of NSTEMI.  The various methods of treatment have been discussed with the patient and family. After consideration of risks, benefits and other options for treatment, the patient has consented to  Procedure(s): LEFT HEART CATH AND CORONARY ANGIOGRAPHY poss pci (N/A) as a surgical intervention.  The patient's history has been reviewed, patient examined, no change in status, stable for surgery.  I have reviewed the patient's chart and labs.  Questions were answered to the patient's satisfaction.     Lorine Bears

## 2019-10-08 NOTE — Consult Note (Signed)
ANTICOAGULATION CONSULT NOTE - Pharmacy Consult for Heparin Infusion Indication: chest pain/ACS  Allergies  Allergen Reactions  . Contrast Media [Iodinated Diagnostic Agents] Shortness Of Breath  . Iohexol Shortness Of Breath     Onset Date: [id number]   . Atorvastatin     Myalgias   . Hydrocodone Itching  . Morphine And Related     Lost control   . Penicillins     Unknown reaction Did it involve swelling of the face/tongue/throat, SOB, or low BP? Unknown Did it involve sudden or severe rash/hives, skin peeling, or any reaction on the inside of your mouth or nose? Unknown Did you need to seek medical attention at a hospital or doctor's office? Yes When did it last happen?Childhood allergy  If all above answers are "NO", may proceed with cephalosporin use.   . Rosuvastatin     Myalgias     Patient Measurements: Height: 5\' 6"  (167.6 cm) Weight: 95.8 kg (211 lb 4.8 oz) IBW/kg (Calculated) : 63.8 Heparin Dosing Weight: 84.6 kg  Vital Signs: Temp: 98.6 F (37 C) (04/04 1930) Temp Source: Oral (04/04 1930) BP: 148/83 (04/04 1930) Pulse Rate: 61 (04/04 1930)  Labs: Recent Labs    10/06/19 1835 10/06/19 1835 10/06/19 1836 10/06/19 2042 10/07/19 0426 10/07/19 0426 10/07/19 1235 10/07/19 1357 10/07/19 1828 10/08/19 0238  HGB 16.6   < >  --   --  15.2  --   --   --   --  13.9  HCT 45.4  --   --   --  42.4  --   --   --   --  40.6  PLT 238  --   --   --  212  --   --   --   --  170  APTT  --   --  28  --   --   --   --   --   --   --   LABPROT  --   --  13.9  --   --   --   --   --   --   --   INR  --   --  1.1  --   --   --   --   --   --   --   HEPARINUNFRC  --   --   --   --  0.20*   < > 0.39  --  0.29* 0.40  CREATININE 0.86  --   --   --  0.84  --   --   --   --   --   TROPONINIHS 36*   < >  --  127*  --   --  10,336* 10,564*  --   --    < > = values in this interval not displayed.    Estimated Creatinine Clearance: 97.5 mL/min (by C-G formula based on  SCr of 0.84 mg/dL).   Medical History: Past Medical History:  Diagnosis Date  . Anginal pain ([place])   . Arthritis   . CAD (coronary artery disease)    a. s/p MI with LAD and Diag stenting @ [name];  b. 06/2015 Cath: LAD 68m/d ISR, 100 RCA (ISR) w/ L->R collats, otw mod nonobs dzs-->Med Rx; c. 08/2019 Cath: LM nl, LAD 10p/m ISR, D1 20, D2 100, RI min irregs, LCX 68m/d, OM1 100, OM2 50, RCA 100p, 70d. RPDA fills via collats from LAD. EF 25-35%-->Med Rx.  . Chronic combined systolic and diastolic  CHF (congestive heart failure) (HCC)    a. 06/2015 Echo: EF 20-25%, Gr3 DD; b. 04/2019 Echo: EF 40-45% w/ inf, infsept, ap ant, mid ant HK. Gr1 DD. Mild MR. Nl RV fxn.  [name] Dyspnea   . Essential hypertension   . Hyperlipidemia   . Hypokalemia    a. 06/2015 in setting of diuresis.  . Ischemic cardiomyopathy    a. 2011 EF 45% ([name]);  b. 06/2015 Echo: EF 20-25%; c. 04/2019 Echo: EF 40-45%; d. 08/2019 LV gram: EF 25-35%.  . Sleep apnea     Medications:  Medications Prior to Admission  Medication Sig Dispense Refill Last Dose  . acetaminophen (TYLENOL) 500 MG tablet Take 1,000 mg by mouth at bedtime as needed. At bedtime and as needed.   10/06/2019 at 1600  . aspirin 81 MG chewable tablet Chew 1 tablet (81 mg total) by mouth daily. 30 tablet 0 10/05/2019 at 2100  . carvedilol (COREG) 6.25 MG tablet Take 1 tablet (6.25 mg total) by mouth 2 (two) times daily. 180 tablet 3 10/06/2019 at 0600  . Clotrimazole (JOCK ITCH RELIEF EX) Apply 1 application topically daily as needed (jock itch).   prn at prn  . colchicine 0.6 MG tablet Take 1 tablet (0.6 mg total) by mouth daily as needed (gout). 10 tablet 3 Past Week at prn  . furosemide (LASIX) 20 MG tablet Take 1 tablet (20 mg total) by mouth daily. 30 tablet 5 10/05/2019 at 0600  . lip balm (BLISTEX) OINT Apply 1 application topically as needed for lip care.   prn at prn  . sacubitril-valsartan (ENTRESTO) 49-51 MG Take 1 tablet by mouth 2 (two) times daily.   10/06/2019 at  0600  . ezetimibe (ZETIA) 10 MG tablet Take 1 tablet (10 mg total) by mouth daily. (Patient not taking: Reported on 10/06/2019) 90 tablet 3 Not Taking at Unknown time  . spironolactone (ALDACTONE) 25 MG tablet Take 1 tablet (25 mg total) by mouth daily. (Patient not taking: Reported on 10/06/2019) 90 tablet 3 Not Taking at Unknown time   Scheduled:  . aspirin  81 mg Oral Pre-Cath  . aspirin EC  81 mg Oral Daily  . carvedilol  6.25 mg Oral BID WC  . insulin aspart  0-9 Units Subcutaneous Q6H   Infusions:  . sodium chloride    . heparin 1,500 Units/hr (10/07/19 2029)   PRN: acetaminophen, ALPRAZolam, fentaNYL (SUBLIMAZE) injection, hydrocortisone, nitroGLYCERIN, nitroGLYCERIN, ondansetron (ZOFRAN) IV, traMADol Anti-infectives (From admission, onward)   None      Assessment: Pharmacy has been consulted to initiate heparin infusion in 64yo presenting to the ED with substernal chest pain. Patient has a significant cardiac history, including multiple heart attacks that he states have presented similarly. Initial troponin level of 36, repeat level of 127.  Patient has no prior history of anticoagulant use PTA. Baseline labs have been ordered and are pending. Will initiate heparin infusion immediately.  Give 4000 units bolus x 1 Start heparin infusion at 1150 units/hr Check anti-Xa level in 6 hours and daily while on heparin Continue to monitor H&H and platelets  0404 0426 HL 0.20, SUBtherapeutic.  CBC stable.  Will increase Heparin drip to 1350 units/hr and recheck HL ~ 6 hours after rate increased.    4/4 @ 1235 HL=0.39. therapeutic. Will continue current drip rate of 1350 units/hr.   Goal of Therapy:  Heparin level 0.3-0.7 units/ml Monitor platelets by anticoagulation protocol: Yes   Plan:  4/4@1828  HL 0.29, subtherapeutic. Will increase Heparin infustion  rate to 1500 units/hr and recheck heparin level in 6 hours.  0405 0238 HL 0.40, therapeutic x 1.  CBC stable.  Continue Heparin  infusion at current rate and recheck HL in 6 hours to confirm   Valrie Hart A 10/08/2019,3:16 AM

## 2019-10-08 NOTE — Progress Notes (Signed)
Patient rested comfortably throughout the night. C/o headache and treated with tramadol. Declined Tylenol. Cath scheduled for 10-08-19.

## 2019-10-08 NOTE — Progress Notes (Signed)
Progress Note  Patient Name: [name]. Date of Encounter: 10/08/2019  Primary Cardiologist: Kathlyn Sacramento, MD   Subjective   S/p cardiac catheterization earlier this AM. He reports headache and lower back pain.  He also reports dizziness.  He is currently eating ice cream. Cardiac cath site examined and without signs of swelling or hematoma.   Inpatient Medications    Scheduled Meds: . aspirin EC  81 mg Oral Daily  . carvedilol  6.25 mg Oral BID WC  . [START ON 10/09/2019] enoxaparin (LOVENOX) injection  40 mg Subcutaneous Q24H  . ezetimibe  10 mg Oral Daily  . furosemide  40 mg Oral BID  . HYDROmorphone      . insulin aspart  0-9 Units Subcutaneous Q6H  . [START ON 10/09/2019] prasugrel  10 mg Oral Daily  . sacubitril-valsartan  1 tablet Oral BID  . sodium chloride flush  3 mL Intravenous Q12H  . spironolactone  25 mg Oral Daily   Continuous Infusions: . sodium chloride 50 mL/hr at 10/08/19 1608  . sodium chloride     PRN Meds: sodium chloride, acetaminophen, ALPRAZolam, fentaNYL (SUBLIMAZE) injection, hydrocortisone, HYDROmorphone (DILAUDID) injection, nitroGLYCERIN, nitroGLYCERIN, ondansetron (ZOFRAN) IV, sodium chloride flush, traMADol   Vital Signs    Vitals:   10/08/19 1424 10/08/19 1439 10/08/19 1543 10/08/19 1612  BP:  (!) 152/84 (!) 159/95 (!) 144/79  Pulse: (!) 58 65 80 76  Resp: 20 18 17    Temp:  98.3 F (36.8 C) 97.7 F (36.5 C) 98.1 F (36.7 C)  TempSrc:  Oral  Oral  SpO2: 92% 95% 94% 92%  Weight:      Height:        Intake/Output Summary (Last 24 hours) at 10/08/2019 1614 Last data filed at 10/08/2019 1543 Gross per 24 hour  Intake 533.62 ml  Output 1400 ml  Net -866.38 ml   Last 3 Weights 10/08/2019 10/07/2019 10/06/2019  Weight (lbs) 216 lb 6.4 oz 211 lb 4.8 oz 217 lb  Weight (kg) 98.158 kg 95.845 kg 98.431 kg      Telemetry    NSR with poor conduction of QRS - Personally Reviewed  ECG    Post cath EKG pending scan into chart-  Personally Reviewed  Physical Exam   GEN: No acute distress.   Neck:JVD difficult to assess due to body habitus Cardiac: RRR, 2/6 systolic murmurs, rubs, or gallops.  Respiratory: Bibasilar crackles. GI: Soft, nontender, slightly distended MS: No edema; No deformity. Neuro:  Nonfocal  Psych: Normal affect   Labs    High Sensitivity Troponin:   Recent Labs  Lab 10/06/19 1835 10/06/19 2042 10/07/19 1235 10/07/19 1357  TROPONINIHS 36* 127* 10,336* 10,564*      Chemistry Recent Labs  Lab 10/06/19 1835 10/07/19 0426 10/08/19 1513  NA 139 139  --   K 3.5 3.6  --   CL 105 105  --   CO2 21* 23  --   GLUCOSE 296* 194*  --   BUN 11 11  --   CREATININE 0.86 0.84 0.82  CALCIUM 9.4 9.0  --   GFRNONAA >60 >60 >60  GFRAA >60 >60 >60  ANIONGAP 13 11  --      Hematology Recent Labs  Lab 10/06/19 1835 10/07/19 0426 10/08/19 0238  WBC 11.8* 9.1 9.8  RBC 5.47 4.98 4.62  HGB 16.6 15.2 13.9  HCT 45.4 42.4 40.6  MCV 83.0 85.1 87.9  MCH 30.3 30.5 30.1  MCHC 36.6* 35.8 34.2  RDW 14.6 15.1 14.8  PLT 238 212 170    BNPNo results for input(s): BNP, PROBNP in the last 168 hours.   DDimer No results for input(s): DDIMER in the last 168 hours.   Radiology    DG Chest 2 View  Result Date: 10/06/2019 CLINICAL DATA:  Chest pain EXAM: CHEST - 2 VIEW COMPARISON:  2016 FINDINGS: The heart size and mediastinal contours are within normal limits. Both lungs are clear. No pleural effusion or pneumothorax. No acute osseous abnormality. IMPRESSION: No acute process in the chest. Electronically Signed   By: Guadlupe Spanish [name]   On: 10/06/2019 19:17   CT Head Wo Contrast  Result Date: 10/06/2019 CLINICAL DATA:  Acute headache. Severe headache with facial tightness, pain/numbness in left arm. EXAM: CT HEAD WITHOUT CONTRAST TECHNIQUE: Contiguous axial images were obtained from the base of the skull through the vertex without intravenous contrast. COMPARISON:  None. FINDINGS: Brain:  Generalized cerebral atrophy. Moderate periventricular and deep white matter hypodensity most consistent with chronic small vessel ischemia. No evidence of acute infarction, hemorrhage, hydrocephalus, extra-axial collection or mass lesion/mass effect. Vascular: No hyperdense vessel. Skull: No fracture or focal lesion. Sinuses/Orbits: Mucosal thickening of left side of sphenoid sinus with mucous retention cyst. Mucosal thickening of left maxillary sinus with heterogeneous debris. Mild ethmoid sinus mucosal thickening on the left. Prior right cataract resection. Mastoid air cells are clear. Other: None. IMPRESSION: 1. Generalized atrophy and chronic small vessel ischemia. No acute intracranial abnormality. 2. Left paranasal sinus disease. Electronically Signed   By: Narda Rutherford [name]   On: 10/06/2019 19:59   CARDIAC CATHETERIZATION  Result Date: 10/08/2019  1st Diag lesion is 60% stenosed.  Prox LAD to Mid LAD lesion is 10% stenosed.  2nd Diag lesion is 100% stenosed.  1st Mrg lesion is 100% stenosed.  Mid Cx to Dist Cx lesion is 100% stenosed.  Prox RCA to Mid RCA lesion is 100% stenosed.  Dist RCA lesion is 70% stenosed.  2nd Mrg lesion is 60% stenosed.  Post intervention, there is a 0% residual stenosis.  A drug-eluting stent was successfully placed using a STENT RESOLUTE ONYX [id number].  1.  Significant underlying three-vessel coronary artery disease with patent stents in the LAD and diagonal with moderate in-stent restenosis in the diagonal stent.  Chronically occluded RCA stents with right to right bridging and left-to-right collaterals.  Diffuse small vessel disease.    The mid/distal left circumflex which was significantly diseased recently is now completely occluded which is the likely culprit for myocardial infarction.  This supplies relatively small size OM 3 which has moderate diffuse atherosclerosis. 2.  Left ventricular angiography was not performed.  EF was 25 to 30% by echo. 3.  Severely  elevated left ventricular end-diastolic pressure at 33 mmHg 4.  Successful angioplasty and drug-eluting stent placement to the left circumflex.  Recommendations: I elected to intervene on the left circumflex given that troponin was still rising and the patient with residual chest pain. Dual antiplatelet therapy for at least 1 year. Resume heart failure medications. Resume furosemide as the patient is significantly volume overloaded. Possible discharge home tomorrow.  ECHOCARDIOGRAM COMPLETE  Result Date: 10/07/2019    ECHOCARDIOGRAM REPORT   Patient Name:   [name]. Date of Exam: 10/07/2019 Medical Rec #:  [id number]         Height:       66.0 in Accession #:    [id number]        Weight:  211.3 lb Date of Birth:  [birth year]          BSA:          2.047 m Patient Age:    64 years          BP:           125/50 mmHg Patient Gender: M                 HR:           60 bpm. Exam Location:  [facility] Procedure: 2D Echo and Intracardiac Opacification Agent Indications:     CAD Native Vessel 414.01/ I25.10  History:         Patient has prior history of Echocardiogram examinations, most                  recent 06/07/2015.  Sonographer:     Wonda Cerise RDCS Referring Phys:  [id number] Andris Baumann Diagnosing Phys: Julien Nordmann MD  Sonographer Comments: Technically difficult study due to poor echo windows. IMPRESSIONS  1. Left ventricular ejection fraction, by estimation, is 25 to 30%. The left ventricle has severely decreased function. The left ventricle demonstrates global hypokinesis with severe hypokinesis of the inferior wall and inferoapical region. The left ventricular internal cavity size was moderately dilated. Left ventricular diastolic parameters are indeterminate.  2. Right ventricular systolic function is normal. The right ventricular size is normal. Tricuspid regurgitation signal is inadequate for assessing PA pressure.  3. Left atrial size was mild to moderately dilated. FINDINGS  Left Ventricle:  Left ventricular ejection fraction, by estimation, is 25 to 30%. The left ventricle has severely decreased function. The left ventricle demonstrates global hypokinesis. Definity contrast agent was given IV to delineate the left ventricular endocardial borders. The left ventricular internal cavity size was moderately dilated. There is no left ventricular hypertrophy. Left ventricular diastolic parameters are indeterminate. Right Ventricle: The right ventricular size is normal. No increase in right ventricular wall thickness. Right ventricular systolic function is normal. Tricuspid regurgitation signal is inadequate for assessing PA pressure. Left Atrium: Left atrial size was mild to moderately dilated. Right Atrium: Right atrial size was normal in size. Pericardium: There is no evidence of pericardial effusion. Mitral Valve: The mitral valve is normal in structure. Normal mobility of the mitral valve leaflets. Trivial mitral valve regurgitation. No evidence of mitral valve stenosis. Tricuspid Valve: The tricuspid valve is normal in structure. Tricuspid valve regurgitation is not demonstrated. No evidence of tricuspid stenosis. Aortic Valve: The aortic valve was not well visualized. Aortic valve regurgitation is not visualized. No aortic stenosis is present. Aortic valve peak gradient measures 4.8 mmHg. Pulmonic Valve: The pulmonic valve was normal in structure. Pulmonic valve regurgitation is not visualized. No evidence of pulmonic stenosis. Aorta: The aortic root is normal in size and structure. Venous: The inferior vena cava is normal in size with greater than 50% respiratory variability, suggesting right atrial pressure of 3 mmHg. IAS/Shunts: No atrial level shunt detected by color flow Doppler.  LEFT VENTRICLE PLAX 2D LVIDd:         5.20 cm      Diastology LVIDs:         4.46 cm      LV e' lateral:   2.83 cm/s LV PW:         1.28 cm      LV E/e' lateral: 18.7 LV IVS:        2.19 cm      LV  e' medial:    3.05 cm/s  LVOT diam:     2.10 cm      LV E/e' medial:  17.3 LV SV:         58 LV SV Index:   28 LVOT Area:     3.46 cm  LV Volumes (MOD) LV vol d, MOD A4C: 236.0 ml LV vol s, MOD A4C: 175.5 ml LV SV MOD A4C:     236.0 ml RIGHT VENTRICLE RV Basal diam:  3.68 cm RV S prime:     22.40 cm/s TAPSE (M-mode): 2.9 cm LEFT ATRIUM              Index       RIGHT ATRIUM           Index LA diam:        4.80 cm  2.34 cm/m  RA Area:     15.60 cm LA Vol (A2C):   107.0 ml 52.27 ml/m RA Volume:   39.00 ml  19.05 ml/m LA Vol (A4C):   49.5 ml  24.18 ml/m LA Biplane Vol: 73.7 ml  36.00 ml/m  AORTIC VALVE                PULMONIC VALVE AV Area (Vmax): 2.51 cm    PV Vmax:       0.88 m/s AV Vmax:        110.00 cm/s PV Peak grad:  3.1 mmHg AV Peak Grad:   4.8 mmHg LVOT Vmax:      79.80 cm/s LVOT Vmean:     53.400 cm/s LVOT VTI:       0.168 m  AORTA Ao Root diam: 3.50 cm Ao Asc diam:  3.30 cm MITRAL VALVE MV Area (PHT): 3.12 cm    SHUNTS MV Decel Time: 243 msec    Systemic VTI:  0.17 m MV E velocity: 52.90 cm/s  Systemic Diam: 2.10 cm MV A velocity: 67.90 cm/s MV E/A ratio:  0.78 Julien Nordmann MD Electronically signed by Julien Nordmann MD Signature Date/Time: 10/07/2019/1:59:05 PM    Final     Cardiac Studies   LHC 10/08/19  1st Diag lesion is 60% stenosed.  Prox LAD to Mid LAD lesion is 10% stenosed.  2nd Diag lesion is 100% stenosed.  1st Mrg lesion is 100% stenosed.  Mid Cx to Dist Cx lesion is 100% stenosed.  Prox RCA to Mid RCA lesion is 100% stenosed.  Dist RCA lesion is 70% stenosed.  2nd Mrg lesion is 60% stenosed.  Post intervention, there is a 0% residual stenosis.  A drug-eluting stent was successfully placed using a STENT RESOLUTE ONYX [id number]. 1. Significant underlying three-vessel coronary artery disease with patent stents in the LAD and diagonal with moderate in-stent restenosis in the diagonal stent. Chronically occluded RCA stents with right to right bridging and left-to-right collaterals. Diffuse  small vessel disease.   The mid/distal left circumflex which was significantly diseased recently is now completely occluded which is the likely culprit for myocardial infarction.  This supplies relatively small size OM 3 which has moderate diffuse atherosclerosis. 2.  Left ventricular angiography was not performed.  EF was 25 to 30% by echo. 3.  Severely elevated left ventricular end-diastolic pressure at 33 mmHg 4.  Successful angioplasty and drug-eluting stent placement to the left circumflex. Recommendations: I elected to intervene on the left circumflex given that troponin was still rising and the patient with residual chest pain. Dual antiplatelet therapy for at least 1 year. Resume  heart failure medications. Resume furosemide as the patient is significantly volume overloaded. Possible discharge home tomorrow.  Echo 10/07/19 1. Left ventricular ejection fraction, by estimation, is 25 to 30%. The  left ventricle has severely decreased function. The left ventricle  demonstrates global hypokinesis with severe hypokinesis of the inferior  wall and inferoapical region. The left  ventricular internal cavity size was moderately dilated. Left ventricular  diastolic parameters are indeterminate.  2. Right ventricular systolic function is normal. The right ventricular  size is normal. Tricuspid regurgitation signal is inadequate for assessing  PA pressure.  3. Left atrial size was mild to moderately dilated.   Patient Profile     64 y.o. 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  Assessment & Plan    CAD s/p catheterization with PCI to LCx NSTEMI --No current chest pain.  S/p cardiac catheterization today 10/08/2019 with cath results as above and  showing significant three-vessel CAD.  Angioplasty and stenting performed to the left circumflex.  LCx intervention performed due to rising troponin in the setting of chest pain. HS Tn 10,564. Continue to cycle until peaked and down-trending. Post cardiac cath EKG still pending scanning into EMR. --Reviewed the cardiac catheterization results with patient in detail with his expressed understanding.   --Examined right radial arteriotomy site with bandage clean, dry, and intact.  No visible signs of swelling or ecchymosis.  Patient denies any pain around site. --Suspect reported dizziness is in the setting of recent cath with volume overload +/- sugar ingested s/p NPO status (ice cream, brownies).  Recommend continue to monitor vitals and on telemetry. --Continue DAPT with ASA and Effient for at least 1 year. Continue current BB, PRN nitro.  Continue Entresto and spironolactone.   Acute on chronic combined systolic and diastolic CHF --Echo as above with EF 25- 30% by echo.  Severely elevated LVEDP at 33 mmHg. Denies current SOB but does report dizziness.  Recommendation s/p cardiac catheterization was to resume furosemide given his significant volume overload.  Bibasilar crackles appreciated on exam.  Recommend caution with fluids given reduced EF. Continue to monitor I's/O's, daily standing weights.  Continue daily BMET.  Continue furosemide and heart failure medications. Discussed that he should follow with the heart failure clinic, in addition to our office.   HTN --Continue current cardiac medications.  HLD --Intolerant to statins. Started on Zetia. Continue.  For questions or updates, please contact [facility] Please consult [email or web address] for contact info under        Signed, Lennon Alstrom, PA-C  10/08/2019, 4:14 PM

## 2019-10-08 NOTE — Progress Notes (Signed)
D: Pt alert and oriented x 4.  Pt reports having chronic lower back pain, meds were given prior and after procedure. Pt understands he must wait before he can have fentanyl again. Pt knows he is limit use of procedural arm and keep elevated. Pt frequently is found using R arm to eat etc..   A: Scheduled medications administered to pt, per MD orders. Support and encouragement provided. Frequent verbal contact made.    R: No adverse drug reactions noted. Pt complaint with medications and treatment plan. Pt interacts well with staff on the unit. Pt is stable at this time, Will continue to monitor and provide care for as ordered.

## 2019-10-09 ENCOUNTER — Inpatient Hospital Stay: Payer: Self-pay

## 2019-10-09 DIAGNOSIS — I214 Non-ST elevation (NSTEMI) myocardial infarction: Principal | ICD-10-CM

## 2019-10-09 DIAGNOSIS — E1165 Type 2 diabetes mellitus with hyperglycemia: Secondary | ICD-10-CM

## 2019-10-09 DIAGNOSIS — I493 Ventricular premature depolarization: Secondary | ICD-10-CM

## 2019-10-09 DIAGNOSIS — R42 Dizziness and giddiness: Secondary | ICD-10-CM

## 2019-10-09 LAB — BASIC METABOLIC PANEL
Anion gap: 10 (ref 5–15)
BUN: 22 mg/dL (ref 8–23)
CO2: 23 mmol/L (ref 22–32)
Calcium: 8.8 mg/dL — ABNORMAL LOW (ref 8.9–10.3)
Chloride: 104 mmol/L (ref 98–111)
Creatinine, Ser: 0.87 mg/dL (ref 0.61–1.24)
GFR calc Af Amer: >60 mL/min (ref >60)
GFR calc non Af Amer: >60 mL/min (ref >60)
Glucose, Bld: 312 mg/dL — ABNORMAL HIGH (ref 70–99)
Potassium: 3.9 mmol/L (ref 3.5–5.1)
Sodium: 137 mmol/L (ref 135–145)

## 2019-10-09 LAB — CBC
HCT: 41.7 % (ref 39.0–52.0)
Hemoglobin: 14.8 g/dL (ref 13.0–17.0)
MCH: 30.5 pg (ref 26.0–34.0)
MCHC: 35.5 g/dL (ref 30.0–36.0)
MCV: 86 fL (ref 80.0–100.0)
Platelets: 204 10*3/uL (ref 150–400)
RBC: 4.85 MIL/uL (ref 4.22–5.81)
RDW: 14.4 % (ref 11.5–15.5)
WBC: 13.7 10*3/uL — ABNORMAL HIGH (ref 4.0–10.5)
nRBC: 0 % (ref 0.0–0.2)

## 2019-10-09 LAB — GLUCOSE, CAPILLARY
Glucose-Capillary: 262 mg/dL — ABNORMAL HIGH (ref 70–99)
Glucose-Capillary: 287 mg/dL — ABNORMAL HIGH (ref 70–99)
Glucose-Capillary: 281 mg/dL — ABNORMAL HIGH (ref 70–99)
Glucose-Capillary: 236 mg/dL — ABNORMAL HIGH (ref 70–99)
Glucose-Capillary: 247 mg/dL — ABNORMAL HIGH (ref 70–99)

## 2019-10-09 LAB — MAGNESIUM: Magnesium: 2.2 mg/dL (ref 1.7–2.4)

## 2019-10-09 MED ORDER — INSULIN ASPART 100 UNIT/ML ~~LOC~~ SOLN
0.0000 [IU] | Freq: Three times a day (TID) | SUBCUTANEOUS | Status: DC
  Administered 2019-10-09: 3 [IU] via SUBCUTANEOUS
  Administered 2019-10-10 (×2): 2 [IU] via SUBCUTANEOUS
  Administered 2019-10-10: 3 [IU] via SUBCUTANEOUS
  Administered 2019-10-10: 5 [IU] via SUBCUTANEOUS
  Administered 2019-10-11: 1 [IU] via SUBCUTANEOUS
  Filled 2019-10-09 (×6): qty 1

## 2019-10-09 MED ORDER — MECLIZINE HCL 25 MG PO TABS
25.0000 mg | ORAL_TABLET | Freq: Three times a day (TID) | ORAL | Status: DC | PRN
  Administered 2019-10-10 – 2019-10-11 (×3): 25 mg via ORAL
  Filled 2019-10-09 (×4): qty 1

## 2019-10-09 MED ORDER — STROKE: EARLY STAGES OF RECOVERY BOOK: Freq: Once | Status: AC

## 2019-10-09 MED ORDER — INSULIN GLARGINE 100 UNIT/ML ~~LOC~~ SOLN
10.0000 [IU] | Freq: Once | SUBCUTANEOUS | Status: AC
  Administered 2019-10-09: 10 [IU] via SUBCUTANEOUS
  Filled 2019-10-09: qty 0.1

## 2019-10-09 MED ORDER — POTASSIUM CHLORIDE CRYS ER 20 MEQ PO TBCR
40.0000 meq | EXTENDED_RELEASE_TABLET | Freq: Once | ORAL | Status: AC
  Administered 2019-10-09: 40 meq via ORAL
  Filled 2019-10-09: qty 2

## 2019-10-09 NOTE — Plan of Care (Addendum)
Patient Insisted on having 2 chocolate Ice creams this morning.  When explained that his CBG values have been high - he became irate and stated that the kitchen had been given him Ice cream whenever he wanted it.  I stated," my job is to protect you and  I will not do anything to harm you.  Ice cream will just increase those numbers." Patient began yelling/cursing -  Indicating that I don't know what he needs and handed me his medications (which I had just given him to take).  I asked, " are you refusing to take these?" Patient responded, "You put your "bleeping" hands all over them."  I said, no Sir, I have given you nothing but respect this morning and I broke those pills out of their package into that med cup."  Meds placed in sharps box and walked out of room, as patient continued to curse and tell me I can't walk out on him.  Patient called [employer] operational officer and "fired" me as Charity fundraiser.  Chrg informed - went to room to talk to patient and changed assisgnment.

## 2019-10-09 NOTE — Progress Notes (Signed)
Progress Note  Patient Name: [name]. Date of Encounter: 10/09/2019  Primary Cardiologist: Lorine Bears, MD   Subjective   Patient complains of dizziness this morning when sitting up.  He describes it as both lightheadedness and the sensation of things spinning around him.  He has experienced vertigo in the past but says this feels different.  Chest pain has almost completely resolved with minimal discomfort still present.  Left arm pain has resolved.  No shortness of breath.  Inpatient Medications    Scheduled Meds: . aspirin EC  81 mg Oral Daily  . carvedilol  6.25 mg Oral BID WC  . enoxaparin (LOVENOX) injection  40 mg Subcutaneous Q24H  . ezetimibe  10 mg Oral Daily  . furosemide  40 mg Oral BID  . insulin aspart  0-9 Units Subcutaneous Q6H  . prasugrel  10 mg Oral Daily  . sacubitril-valsartan  1 tablet Oral BID  . sodium chloride flush  3 mL Intravenous Q12H  . spironolactone  25 mg Oral Daily   Continuous Infusions: . sodium chloride     PRN Meds: sodium chloride, acetaminophen, ALPRAZolam, fentaNYL (SUBLIMAZE) injection, hydrocortisone, HYDROmorphone (DILAUDID) injection, nitroGLYCERIN, nitroGLYCERIN, ondansetron (ZOFRAN) IV, sodium chloride flush, traMADol   Vital Signs    Vitals:   10/08/19 1612 10/08/19 1748 10/08/19 1917 10/09/19 0533  BP: (!) 144/79 (!) 165/89 (!) 146/88 126/83  Pulse: 76 74 75 62  Resp:   18 20  Temp: 98.1 F (36.7 C) 97.7 F (36.5 C) 97.7 F (36.5 C) 97.6 F (36.4 C)  TempSrc: Oral Oral Oral Oral  SpO2: 92% 94% 93% 91%  Weight:    96.8 kg  Height:        Intake/Output Summary (Last 24 hours) at 10/09/2019 0805 Last data filed at 10/09/2019 0648 Gross per 24 hour  Intake 758.04 ml  Output 2575 ml  Net -1816.96 ml   Last 3 Weights 10/09/2019 10/08/2019 10/07/2019  Weight (lbs) 213 lb 6.4 oz 216 lb 6.4 oz 211 lb 4.8 oz  Weight (kg) 96.798 kg 98.158 kg 95.845 kg      Telemetry    Sinus rhythm with frequent PVCs - Personally  Reviewed  ECG    Normal sinus rhythm with frequent PVCs, inferior Q waves, and poor R wave progression. - Personally Reviewed  Physical Exam   GEN: No acute distress.   Neck: No JVD Cardiac: RRR with frequent extrasystoles.  No murmurs, rubs, or gallops. Respiratory:  Diminished breath sounds at the left base.  Lungs otherwise clear without wheezes or crackles. GI: Soft, nontender, non-distended  MS: No edema; No deformity.  Right radial arteriotomy site clean without hematoma. Neuro:  Nonfocal with 5/5 upper and lower extremity strength bilaterally.  Cranial nerves III through XII intact. Psych: Normal affect   Labs    High Sensitivity Troponin:   Recent Labs  Lab 10/06/19 1835 10/06/19 2042 10/07/19 1235 10/07/19 1357  TROPONINIHS 36* 127* 10,336* 10,564*      Chemistry Recent Labs  Lab 10/06/19 1835 10/06/19 1835 10/07/19 0426 10/08/19 1513 10/09/19 0504  NA 139  --  139  --  137  K 3.5  --  3.6  --  3.9  CL 105  --  105  --  104  CO2 21*  --  23  --  23  GLUCOSE 296*  --  194*  --  312*  BUN 11  --  11  --  22  CREATININE 0.86   < >  0.84 0.82 0.87  CALCIUM 9.4  --  9.0  --  8.8*  GFRNONAA >60   < > >60 >60 >60  GFRAA >60   < > >60 >60 >60  ANIONGAP 13  --  11  --  10   < > = values in this interval not displayed.     Hematology Recent Labs  Lab 10/07/19 0426 10/08/19 0238 10/09/19 0504  WBC 9.1 9.8 13.7*  RBC 4.98 4.62 4.85  HGB 15.2 13.9 14.8  HCT 42.4 40.6 41.7  MCV 85.1 87.9 86.0  MCH 30.5 30.1 30.5  MCHC 35.8 34.2 35.5  RDW 15.1 14.8 14.4  PLT 212 170 204    BNPNo results for input(s): BNP, PROBNP in the last 168 hours.   DDimer No results for input(s): DDIMER in the last 168 hours.   Radiology    CARDIAC CATHETERIZATION  Result Date: 10/08/2019  1st Diag lesion is 60% stenosed.  Prox LAD to Mid LAD lesion is 10% stenosed.  2nd Diag lesion is 100% stenosed.  1st Mrg lesion is 100% stenosed.  Mid Cx to Dist Cx lesion is 100%  stenosed.  Prox RCA to Mid RCA lesion is 100% stenosed.  Dist RCA lesion is 70% stenosed.  2nd Mrg lesion is 60% stenosed.  Post intervention, there is a 0% residual stenosis.  A drug-eluting stent was successfully placed using a STENT RESOLUTE ONYX [id number].  1.  Significant underlying three-vessel coronary artery disease with patent stents in the LAD and diagonal with moderate in-stent restenosis in the diagonal stent.  Chronically occluded RCA stents with right to right bridging and left-to-right collaterals.  Diffuse small vessel disease.    The mid/distal left circumflex which was significantly diseased recently is now completely occluded which is the likely culprit for myocardial infarction.  This supplies relatively small size OM 3 which has moderate diffuse atherosclerosis. 2.  Left ventricular angiography was not performed.  EF was 25 to 30% by echo. 3.  Severely elevated left ventricular Hezekiah Veltre-diastolic pressure at 33 mmHg 4.  Successful angioplasty and drug-eluting stent placement to the left circumflex.  Recommendations: I elected to intervene on the left circumflex given that troponin was still rising and the patient with residual chest pain. Dual antiplatelet therapy for at least 1 year. Resume heart failure medications. Resume furosemide as the patient is significantly volume overloaded. Possible discharge home tomorrow.  ECHOCARDIOGRAM COMPLETE  Result Date: 10/07/2019    ECHOCARDIOGRAM REPORT   Patient Name:   [name]. Date of Exam: 10/07/2019 Medical Rec #:  [id number]         Height:       66.0 in Accession #:    [id number]        Weight:       211.3 lb Date of Birth:  [birth year]          BSA:          2.047 m Patient Age:    64 years          BP:           125/50 mmHg Patient Gender: M                 HR:           60 bpm. Exam Location:  [facility] Procedure: 2D Echo and Intracardiac Opacification Agent Indications:     CAD Native Vessel 414.01/ I25.10  History:         Patient has  prior  history of Echocardiogram examinations, most                  recent 06/07/2015.  Sonographer:     Arville Go RDCS Referring Phys:  [id number] Athena Masse Diagnosing Phys: Ida Rogue MD  Sonographer Comments: Technically difficult study due to poor echo windows. IMPRESSIONS  1. Left ventricular ejection fraction, by estimation, is 25 to 30%. The left ventricle has severely decreased function. The left ventricle demonstrates global hypokinesis with severe hypokinesis of the inferior wall and inferoapical region. The left ventricular internal cavity size was moderately dilated. Left ventricular diastolic parameters are indeterminate.  2. Right ventricular systolic function is normal. The right ventricular size is normal. Tricuspid regurgitation signal is inadequate for assessing PA pressure.  3. Left atrial size was mild to moderately dilated. FINDINGS  Left Ventricle: Left ventricular ejection fraction, by estimation, is 25 to 30%. The left ventricle has severely decreased function. The left ventricle demonstrates global hypokinesis. Definity contrast agent was given IV to delineate the left ventricular endocardial borders. The left ventricular internal cavity size was moderately dilated. There is no left ventricular hypertrophy. Left ventricular diastolic parameters are indeterminate. Right Ventricle: The right ventricular size is normal. No increase in right ventricular wall thickness. Right ventricular systolic function is normal. Tricuspid regurgitation signal is inadequate for assessing PA pressure. Left Atrium: Left atrial size was mild to moderately dilated. Right Atrium: Right atrial size was normal in size. Pericardium: There is no evidence of pericardial effusion. Mitral Valve: The mitral valve is normal in structure. Normal mobility of the mitral valve leaflets. Trivial mitral valve regurgitation. No evidence of mitral valve stenosis. Tricuspid Valve: The tricuspid valve is normal in structure.  Tricuspid valve regurgitation is not demonstrated. No evidence of tricuspid stenosis. Aortic Valve: The aortic valve was not well visualized. Aortic valve regurgitation is not visualized. No aortic stenosis is present. Aortic valve peak gradient measures 4.8 mmHg. Pulmonic Valve: The pulmonic valve was normal in structure. Pulmonic valve regurgitation is not visualized. No evidence of pulmonic stenosis. Aorta: The aortic root is normal in size and structure. Venous: The inferior vena cava is normal in size with greater than 50% respiratory variability, suggesting right atrial pressure of 3 mmHg. IAS/Shunts: No atrial level shunt detected by color flow Doppler.  LEFT VENTRICLE PLAX 2D LVIDd:         5.20 cm      Diastology LVIDs:         4.46 cm      LV e' lateral:   2.83 cm/s LV PW:         1.28 cm      LV E/e' lateral: 18.7 LV IVS:        2.19 cm      LV e' medial:    3.05 cm/s LVOT diam:     2.10 cm      LV E/e' medial:  17.3 LV SV:         58 LV SV Index:   28 LVOT Area:     3.46 cm  LV Volumes (MOD) LV vol d, MOD A4C: 236.0 ml LV vol s, MOD A4C: 175.5 ml LV SV MOD A4C:     236.0 ml RIGHT VENTRICLE RV Basal diam:  3.68 cm RV S prime:     22.40 cm/s TAPSE (M-mode): 2.9 cm LEFT ATRIUM              Index  RIGHT ATRIUM           Index LA diam:        4.80 cm  2.34 cm/m  RA Area:     15.60 cm LA Vol (A2C):   107.0 ml 52.27 ml/m RA Volume:   39.00 ml  19.05 ml/m LA Vol (A4C):   49.5 ml  24.18 ml/m LA Biplane Vol: 73.7 ml  36.00 ml/m  AORTIC VALVE                PULMONIC VALVE AV Area (Vmax): 2.51 cm    PV Vmax:       0.88 m/s AV Vmax:        110.00 cm/s PV Peak grad:  3.1 mmHg AV Peak Grad:   4.8 mmHg LVOT Vmax:      79.80 cm/s LVOT Vmean:     53.400 cm/s LVOT VTI:       0.168 m  AORTA Ao Root diam: 3.50 cm Ao Asc diam:  3.30 cm MITRAL VALVE MV Area (PHT): 3.12 cm    SHUNTS MV Decel Time: 243 msec    Systemic VTI:  0.17 m MV E velocity: 52.90 cm/s  Systemic Diam: 2.10 cm MV A velocity: 67.90 cm/s MV E/A  ratio:  0.78 Julien Nordmann MD Electronically signed by Julien Nordmann MD Signature Date/Time: 10/07/2019/1:59:05 PM    Final     Cardiac Studies   LHC/PCI (10/08/19): 1. Significant underlying three-vessel coronary artery disease with patent stents in the LAD and diagonal with moderate in-stent restenosis in the diagonal stent. Chronically occluded RCA stents with right to right bridging and left-to-right collaterals. Diffuse small vessel disease.   The mid/distal left circumflex which was significantly diseased recently is now completely occluded which is the likely culprit for myocardial infarction.  This supplies relatively small size OM 3 which has moderate diffuse atherosclerosis. 2.  Left ventricular angiography was not performed.  EF was 25 to 30% by echo. 3.  Severely elevated left ventricular Tarell Schollmeyer-diastolic pressure at 33 mmHg 4.  Successful angioplasty and drug-eluting stent placement to the left circumflex.  TTE (10/07/19): 1. Left ventricular ejection fraction, by estimation, is 25 to 30%. The  left ventricle has severely decreased function. The left ventricle  demonstrates global hypokinesis with severe hypokinesis of the inferior  wall and inferoapical region. The left  ventricular internal cavity size was moderately dilated. Left ventricular  diastolic parameters are indeterminate.  2. Right ventricular systolic function is normal. The right ventricular  size is normal. Tricuspid regurgitation signal is inadequate for assessing  PA pressure.  3. Left atrial size was mild to moderately dilated.   Patient Profile     64 y.o. 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  Assessment & Plan    NSTEMI: Symptoms much improved status post PCI to the LCx yesterday.  Continue  aspirin and prasugrel for at least 12 months.  Continue ezetimibe 10 mg daily given history of statin intolerance.  Consider addition of PCSK9 inhibitor or bempedoic acid on an outpatient basis.  Cardiac rehab after discharge.  Continue carvedilol 6.25 mg twice daily.  Chronic systolic heart failure due to ischemic cardiomyopathy: Patient appears euvolemic.  LVEF severely reduced on echo during this admission.  Continue current doses of carvedilol, spironolactone, and Entresto.  Dizziness and frequent PVCs: Dizziness sounds like vertigo, as it is positional and associated with a spinning feeling.  Frequent PVCs noted on telemetry and EKG that may be contributing to symptoms.  Potassium low normal today.  Check magnesium level.  Give potassium chloride 40 mEq p.o. x1.  Check orthostatic vital signs.  We will add meclizine for symptomatic relief.  If symptoms persist, may need to consider MRI brain to exclude stroke in the setting of catheterization yesterday.   For questions or updates, please contact [facility] Please consult [email or web address] for contact info under [facility].  Signed, Yvonne Kendall, MD  10/09/2019, 8:05 AM

## 2019-10-09 NOTE — Progress Notes (Signed)
Patient is uncooperative with plan of care. Patient refuses to use the urinal. Patient continues to ask staff for chocolate ice cream. Primary staff explained to patient that his blood sugar levels are very high. Patient ignores instructions. Patient refused tylenol and tramadol for chronic back pain. Administered one time does of toradol. Patient in not engage with care.

## 2019-10-09 NOTE — Progress Notes (Addendum)
Progress Note    [name].  TDD:[id number] DOB: 1955-12-01  DOA: 10/06/2019 PCP: [name], MD      Brief Narrative:    Medical records reviewed and are as summarized below:  [name]. is an 64 y.o. male  with medical history significant for coronary artery disease status post MI x3 with recent cardiac cath in February 2021 showing patent stents and severe triple-vessel disease managed with aggressive medical management, as well as history of systolic heart failure EF 25 to 35% on recent cath, secondary to ischemic cardiomyopathy, hypertension and HLD who presents to the emergency room with onset of typical chest pain, retrosternal radiating to the back, throat and left arm of intensity 10 out of 10, similar and somewhat worse than his prior heart attack.  It was associated with diaphoresis and nausea.  Troponins were significantly elevated to 10,336.  He was diagnosed with acute NSTEMI.  He was treated with IV heparin infusion.  He was seen in consultation by the cardiologist.  He underwent left heart catheterization and a drug-eluting stent was placed to the left circumflex.  Dual antiplatelet therapy with aspirin and prasugrel for at least 1 year was recommended.  Hemoglobin A1c was 7.1 consistent with type 2 diabetes mellitus.      Assessment/Plan:   Principal Problem:   NSTEMI (non-ST elevated myocardial infarction) (HCC) Active Problems:   Unstable angina (HCC)   CAD (coronary artery disease)   Essential hypertension   Hyperlipidemia   Chronic systolic heart failure (HCC)   Ischemic cardiomyopathy   Type 2 diabetes mellitus with hyperglycemia (HCC)   Chest pain, acute NSTEMI, headache and elevated troponins   CAD (coronary artery disease) s/p MI x3, hx of stents S/p left heart cath with placement of drug-eluting stent to left circumflex. Dual antiplatelet therapy with aspirin and prasugrel. Patient intolerant to statins.  Follow-up with cardiologist  for further recommendations.   Dizziness -Orthostatic vital signs are unremarkable.  CT head did not show any acute abnormality.  MRI has been ordered for further evaluation given recent left heart cath.    Essential hypertension -Continue carvedilol as able  Type 2 diabetes mellitus with hyperglycemia. Hemoglobin A1c 7.1.  NovoLog as needed for hyperglycemia.  10 units of Lantus was given today.  Patient will likely be discharged on Metformin.    Hyperlipidemia -Continue Zetia    Chronic systolic heart failure (HCC)    Ischemic cardiomyopathy -Continue Lasix, Entresto and Aldactone and Coreg  Acute on chronic low back pain Back pain is better today. He had a laminectomy in October 2021   Body mass index is 34.44 kg/m.  (Obesity)   Family Communication/Anticipated D/C date and plan/Code Status   DVT prophylaxis: Lovenox Code Status: Full code Family Communication: Plan discussed with the patient Disposition Plan: Patient is from home.  Plan to discharge him home tomorrow.     Subjective:   C/o dizziness.  According to his nurse, he vomited this morning.  No chest pain, headache, unilateral numbness or weakness.  He was able to walk to the bathroom although he felt dizzy.  Objective:    Vitals:   10/09/19 0533 10/09/19 0808 10/09/19 1028 10/09/19 1153  BP: 126/83 134/79 138/82 117/69  Pulse: 62 64 72 65  Resp: 20 17  18   Temp: 97.6 F (36.4 C) (!) 97.5 F (36.4 C)  98.3 F (36.8 C)  TempSrc: Oral     SpO2: 91% 93%  93%  Weight: 96.8 kg     Height:        Intake/Output Summary (Last 24 hours) at 10/09/2019 1537 Last data filed at 10/09/2019 1319 Gross per 24 hour  Intake 998.04 ml  Output 1875 ml  Net -876.96 ml   Filed Weights   10/07/19 0053 10/08/19 0613 10/09/19 0533  Weight: 95.8 kg 98.2 kg 96.8 kg    Exam:  GEN: NAD SKIN: No rash EYES: EOMI ENT: MMM CV: RRR PULM: CTA B ABD: soft, ND, NT, +BS CNS: AAO x 3, non focal EXT: No edema or  tenderness   Data Reviewed:   I have personally reviewed following labs and imaging studies:  Labs: Labs show the following:   Basic Metabolic Panel: Recent Labs  Lab 10/06/19 1835 10/06/19 1835 10/07/19 0426 10/08/19 1513 10/09/19 0504  NA 139  --  139  --  137  K 3.5   < > 3.6  --  3.9  CL 105  --  105  --  104  CO2 21*  --  23  --  23  GLUCOSE 296*  --  194*  --  312*  BUN 11  --  11  --  22  CREATININE 0.86  --  0.84 0.82 0.87  CALCIUM 9.4  --  9.0  --  8.8*  MG  --   --   --   --  2.2   < > = values in this interval not displayed.   GFR Estimated Creatinine Clearance: 94.7 mL/min (by C-G formula based on SCr of 0.87 mg/dL). Liver Function Tests: No results for input(s): AST, ALT, ALKPHOS, BILITOT, PROT, ALBUMIN in the last 168 hours. No results for input(s): LIPASE, AMYLASE in the last 168 hours. No results for input(s): AMMONIA in the last 168 hours. Coagulation profile Recent Labs  Lab 10/06/19 1836  INR 1.1    CBC: Recent Labs  Lab 10/06/19 1835 10/07/19 0426 10/08/19 0238 10/09/19 0504  WBC 11.8* 9.1 9.8 13.7*  HGB 16.6 15.2 13.9 14.8  HCT 45.4 42.4 40.6 41.7  MCV 83.0 85.1 87.9 86.0  PLT 238 212 170 204   Cardiac Enzymes: No results for input(s): CKTOTAL, CKMB, CKMBINDEX, TROPONINI in the last 168 hours. BNP (last 3 results) No results for input(s): PROBNP in the last 8760 hours. CBG: Recent Labs  Lab 10/08/19 1009 10/08/19 1627 10/08/19 2356 10/09/19 0535 10/09/19 1150  GLUCAP 165* 280* 336* 262* 287*   D-Dimer: No results for input(s): DDIMER in the last 72 hours. Hgb A1c: Recent Labs    10/07/19 0426  HGBA1C 7.1*   Lipid Profile: Recent Labs    10/07/19 0426  CHOL 139  HDL 42  LDLCALC 71  TRIG 132  CHOLHDL 3.3   Thyroid function studies: No results for input(s): TSH, T4TOTAL, T3FREE, THYROIDAB in the last 72 hours.  Invalid input(s): FREET3 Anemia work up: No results for input(s): VITAMINB12, FOLATE, FERRITIN,  TIBC, IRON, RETICCTPCT in the last 72 hours. Sepsis Labs: Recent Labs  Lab 10/06/19 1835 10/07/19 0426 10/08/19 0238 10/09/19 0504  WBC 11.8* 9.1 9.8 13.7*    Microbiology Recent Results (from the past 240 hour(s))  Respiratory Panel by RT PCR (Flu A&B, Covid) - Nasopharyngeal Swab     Status: None   Collection Time: 10/06/19 11:38 PM   Specimen: Nasopharyngeal Swab  Result Value Ref Range Status   SARS Coronavirus 2 by RT PCR NEGATIVE NEGATIVE Final    Comment: (NOTE) SARS-CoV-2 target nucleic acids are  NOT DETECTED. The SARS-CoV-2 RNA is generally detectable in upper respiratoy specimens during the acute phase of infection. The lowest concentration of SARS-CoV-2 viral copies this assay can detect is 131 copies/mL. A negative result does not preclude SARS-Cov-2 infection and should not be used as the sole basis for treatment or other patient management decisions. A negative result may occur with  improper specimen collection/handling, submission of specimen other than nasopharyngeal swab, presence of viral mutation(s) within the areas targeted by this assay, and inadequate number of viral copies (<131 copies/mL). A negative result must be combined with clinical observations, patient history, and epidemiological information. The expected result is Negative. Fact Sheet for Patients:  PinkCheek.be Fact Sheet for Healthcare Providers:  GravelBags.it This test is not yet ap proved or cleared by the [place] FDA and  has been authorized for detection and/or diagnosis of SARS-CoV-2 by FDA under an Emergency Use Authorization (EUA). This EUA will remain  in effect (meaning this test can be used) for the duration of the COVID-19 declaration under Section 564(b)(1) of the Act, 21 U.S.C. section 360bbb-3(b)(1), unless the authorization is terminated or revoked sooner.    Influenza A by PCR NEGATIVE NEGATIVE Final    Influenza B by PCR NEGATIVE NEGATIVE Final    Comment: (NOTE) The Xpert Xpress SARS-CoV-2/FLU/RSV assay is intended as an aid in  the diagnosis of influenza from Nasopharyngeal swab specimens and  should not be used as a sole basis for treatment. Nasal washings and  aspirates are unacceptable for Xpert Xpress SARS-CoV-2/FLU/RSV  testing. Fact Sheet for Patients: PinkCheek.be Fact Sheet for Healthcare Providers: GravelBags.it This test is not yet approved or cleared by the [place] FDA and  has been authorized for detection and/or diagnosis of SARS-CoV-2 by  FDA under an Emergency Use Authorization (EUA). This EUA will remain  in effect (meaning this test can be used) for the duration of the  Covid-19 declaration under Section 564(b)(1) of the Act, 21  U.S.C. section 360bbb-3(b)(1), unless the authorization is  terminated or revoked. Performed at [facility], [address], [place], [place] [address]     Procedures and diagnostic studies:  CT HEAD WO CONTRAST  Result Date: 10/09/2019 CLINICAL DATA:  Dizziness.  PVCs. EXAM: CT HEAD WITHOUT CONTRAST TECHNIQUE: Contiguous axial images were obtained from the base of the skull through the vertex without intravenous contrast. COMPARISON:  CT head 10/06/2019 FINDINGS: Brain: Mild atrophy without hydrocephalus. Patchy white matter hypodensity bilaterally is unchanged and most compatible with chronic microvascular ischemia. Negative for acute infarct, hemorrhage, mass. Vascular: Negative for hyperdense vessel Skull: Negative Sinuses/Orbits: Mucosal edema and bony thickening left maxillary sinus. Mucosal edema left sphenoid sinus. Right-sided cataract extraction. Other: None IMPRESSION: No acute intracranial abnormality. Atrophy and chronic microvascular ischemic change in the white matter. Electronically Signed   By: Franchot Gallo [name]   On: 10/09/2019 12:28   CARDIAC  CATHETERIZATION  Result Date: 10/08/2019  1st Diag lesion is 60% stenosed.  Prox LAD to Mid LAD lesion is 10% stenosed.  2nd Diag lesion is 100% stenosed.  1st Mrg lesion is 100% stenosed.  Mid Cx to Dist Cx lesion is 100% stenosed.  Prox RCA to Mid RCA lesion is 100% stenosed.  Dist RCA lesion is 70% stenosed.  2nd Mrg lesion is 60% stenosed.  Post intervention, there is a 0% residual stenosis.  A drug-eluting stent was successfully placed using a STENT RESOLUTE ONYX [id number].  1.  Significant underlying three-vessel coronary artery disease with patent  stents in the LAD and diagonal with moderate in-stent restenosis in the diagonal stent.  Chronically occluded RCA stents with right to right bridging and left-to-right collaterals.  Diffuse small vessel disease.    The mid/distal left circumflex which was significantly diseased recently is now completely occluded which is the likely culprit for myocardial infarction.  This supplies relatively small size OM 3 which has moderate diffuse atherosclerosis. 2.  Left ventricular angiography was not performed.  EF was 25 to 30% by echo. 3.  Severely elevated left ventricular end-diastolic pressure at 33 mmHg 4.  Successful angioplasty and drug-eluting stent placement to the left circumflex.  Recommendations: I elected to intervene on the left circumflex given that troponin was still rising and the patient with residual chest pain. Dual antiplatelet therapy for at least 1 year. Resume heart failure medications. Resume furosemide as the patient is significantly volume overloaded. Possible discharge home tomorrow.   Medications:    aspirin EC  81 mg Oral Daily   carvedilol  6.25 mg Oral BID WC   enoxaparin (LOVENOX) injection  40 mg Subcutaneous Q24H   ezetimibe  10 mg Oral Daily   furosemide  40 mg Oral BID   insulin aspart  0-9 Units Subcutaneous Q6H   prasugrel  10 mg Oral Daily   sacubitril-valsartan  1 tablet Oral BID   sodium chloride  flush  3 mL Intravenous Q12H   spironolactone  25 mg Oral Daily   Continuous Infusions:  sodium chloride       LOS: 3 days   Alessio Bogan  [facility]     10/09/2019, 3:37 PM                Progress Note    [name].  WUJ:[id number] DOB: 09-Nov-1955  DOA: 10/06/2019 PCP: [name], MD      Brief Narrative:    Medical records reviewed and are as summarized below:  [name]. is an 64 y.o. male  with medical history significant for coronary artery disease status post MI x3 with recent cardiac cath in February 2021 showing patent stents and severe triple-vessel disease managed with aggressive medical management, as well as history of systolic heart failure EF 25 to 35% on recent cath, secondary to ischemic cardiomyopathy, hypertension and HLD who presents to the emergency room with onset of typical chest pain, retrosternal radiating to the back, throat and left arm of intensity 10 out of 10, similar and somewhat worse than his prior heart attack.  It was associated with diaphoresis and nausea.      Assessment/Plan:   Principal Problem:   NSTEMI (non-ST elevated myocardial infarction) (HCC) Active Problems:   Unstable angina (HCC)   CAD (coronary artery disease)   Essential hypertension   Hyperlipidemia   Chronic systolic heart failure (HCC)   Ischemic cardiomyopathy   Type 2 diabetes mellitus with hyperglycemia (HCC)   Acute NSTEMI,    CAD (coronary artery disease) s/p MI x3, hx of stents S/p left heart cath today with placement of drug-eluting stent to left circumflex Aspirin and Brilinta recommended for at least 1 year.  Follow-up with cardiologist for further recommendations.    Essential hypertension -Continue carvedilol as able  Type 2 diabetes mellitus Hemoglobin A1c was 7.1.  NovoLog as needed for hyperglycemia.    Hyperlipidemia -Continue Zetia    Chronic systolic heart failure (HCC)    Ischemic  cardiomyopathy -Continue Lasix, Entresto and Aldactone and Coreg  Acute on chronic low  back pain  He had to be given fentanyl and IV Dilaudid for severe back pain at the recovery room in the Cath Lab Patient had a laminectomy for lumbar herniated disc on April 30, 2020  Body mass index is 34.44 kg/m.  (Obesity)   Family Communication/Anticipated D/C date and plan/Code Status   DVT prophylaxis: IV heparin Code Status: Full code Family Communication: Plan discussed with the patient Disposition Plan: Patient is from home.  Plan to discharge him home tomorrow    Subjective:   He complains of severe low back pain.  He said he had surgery on his lower back in October last year.  Chest pain has resolved.  Headache has also improved.  Objective:    Vitals:   10/09/19 0533 10/09/19 0808 10/09/19 1028 10/09/19 1153  BP: 126/83 134/79 138/82 117/69  Pulse: 62 64 72 65  Resp: 20 17  18   Temp: 97.6 F (36.4 C) (!) 97.5 F (36.4 C)  98.3 F (36.8 C)  TempSrc: Oral     SpO2: 91% 93%  93%  Weight: 96.8 kg     Height:        Intake/Output Summary (Last 24 hours) at 10/09/2019 1537 Last data filed at 10/09/2019 1319 Gross per 24 hour  Intake 998.04 ml  Output 1875 ml  Net -876.96 ml   Filed Weights   10/07/19 0053 10/08/19 0613 10/09/19 0533  Weight: 95.8 kg 98.2 kg 96.8 kg    Exam:  GEN: NAD SKIN: Warm and dry EYES: EOMI ENT: MMM CV: RRR PULM: No wheezing or rales heard ABD: soft, ND, NT, +BS CNS: AAO x 3, non focal EXT: No edema or tenderness   Data Reviewed:   I have personally reviewed following labs and imaging studies:  Labs: Labs show the following:   Basic Metabolic Panel: Recent Labs  Lab 10/06/19 1835 10/06/19 1835 10/07/19 0426 10/08/19 1513 10/09/19 0504  NA 139  --  139  --  137  K 3.5   < > 3.6  --  3.9  CL 105  --  105  --  104  CO2 21*  --  23  --  23  GLUCOSE 296*  --  194*  --  312*  BUN 11  --  11  --  22  CREATININE 0.86  --   0.84 0.82 0.87  CALCIUM 9.4  --  9.0  --  8.8*  MG  --   --   --   --  2.2   < > = values in this interval not displayed.   GFR Estimated Creatinine Clearance: 94.7 mL/min (by C-G formula based on SCr of 0.87 mg/dL). Liver Function Tests: No results for input(s): AST, ALT, ALKPHOS, BILITOT, PROT, ALBUMIN in the last 168 hours. No results for input(s): LIPASE, AMYLASE in the last 168 hours. No results for input(s): AMMONIA in the last 168 hours. Coagulation profile Recent Labs  Lab 10/06/19 1836  INR 1.1    CBC: Recent Labs  Lab 10/06/19 1835 10/07/19 0426 10/08/19 0238 10/09/19 0504  WBC 11.8* 9.1 9.8 13.7*  HGB 16.6 15.2 13.9 14.8  HCT 45.4 42.4 40.6 41.7  MCV 83.0 85.1 87.9 86.0  PLT 238 212 170 204   Cardiac Enzymes: No results for input(s): CKTOTAL, CKMB, CKMBINDEX, TROPONINI in the last 168 hours. BNP (last 3 results) No results for input(s): PROBNP in the last 8760 hours. CBG: Recent Labs  Lab 10/08/19 1009 10/08/19 1627 10/08/19 2356 10/09/19 0535 10/09/19  1150  GLUCAP 165* 280* 336* 262* 287*   D-Dimer: No results for input(s): DDIMER in the last 72 hours. Hgb A1c: Recent Labs    10/07/19 0426  HGBA1C 7.1*   Lipid Profile: Recent Labs    10/07/19 0426  CHOL 139  HDL 42  LDLCALC 71  TRIG 132  CHOLHDL 3.3   Thyroid function studies: No results for input(s): TSH, T4TOTAL, T3FREE, THYROIDAB in the last 72 hours.  Invalid input(s): FREET3 Anemia work up: No results for input(s): VITAMINB12, FOLATE, FERRITIN, TIBC, IRON, RETICCTPCT in the last 72 hours. Sepsis Labs: Recent Labs  Lab 10/06/19 1835 10/07/19 0426 10/08/19 0238 10/09/19 0504  WBC 11.8* 9.1 9.8 13.7*    Microbiology Recent Results (from the past 240 hour(s))  Respiratory Panel by RT PCR (Flu A&B, Covid) - Nasopharyngeal Swab     Status: None   Collection Time: 10/06/19 11:38 PM   Specimen: Nasopharyngeal Swab  Result Value Ref Range Status   SARS Coronavirus 2 by RT  PCR NEGATIVE NEGATIVE Final    Comment: (NOTE) SARS-CoV-2 target nucleic acids are NOT DETECTED. The SARS-CoV-2 RNA is generally detectable in upper respiratoy specimens during the acute phase of infection. The lowest concentration of SARS-CoV-2 viral copies this assay can detect is 131 copies/mL. A negative result does not preclude SARS-Cov-2 infection and should not be used as the sole basis for treatment or other patient management decisions. A negative result may occur with  improper specimen collection/handling, submission of specimen other than nasopharyngeal swab, presence of viral mutation(s) within the areas targeted by this assay, and inadequate number of viral copies (<131 copies/mL). A negative result must be combined with clinical observations, patient history, and epidemiological information. The expected result is Negative. Fact Sheet for Patients:  https://www.moore.com/ Fact Sheet for Healthcare Providers:  https://www.young.biz/ This test is not yet ap proved or cleared by the [place] FDA and  has been authorized for detection and/or diagnosis of SARS-CoV-2 by FDA under an Emergency Use Authorization (EUA). This EUA will remain  in effect (meaning this test can be used) for the duration of the COVID-19 declaration under Section 564(b)(1) of the Act, 21 U.S.C. section 360bbb-3(b)(1), unless the authorization is terminated or revoked sooner.    Influenza A by PCR NEGATIVE NEGATIVE Final   Influenza B by PCR NEGATIVE NEGATIVE Final    Comment: (NOTE) The Xpert Xpress SARS-CoV-2/FLU/RSV assay is intended as an aid in  the diagnosis of influenza from Nasopharyngeal swab specimens and  should not be used as a sole basis for treatment. Nasal washings and  aspirates are unacceptable for Xpert Xpress SARS-CoV-2/FLU/RSV  testing. Fact Sheet for Patients: https://www.moore.com/ Fact Sheet for Healthcare  Providers: https://www.young.biz/ This test is not yet approved or cleared by the [place] FDA and  has been authorized for detection and/or diagnosis of SARS-CoV-2 by  FDA under an Emergency Use Authorization (EUA). This EUA will remain  in effect (meaning this test can be used) for the duration of the  Covid-19 declaration under Section 564(b)(1) of the Act, 21  U.S.C. section 360bbb-3(b)(1), unless the authorization is  terminated or revoked. Performed at [facility], [address], [place], [place] [address]     Procedures and diagnostic studies:  CT HEAD WO CONTRAST  Result Date: 10/09/2019 CLINICAL DATA:  Dizziness.  PVCs. EXAM: CT HEAD WITHOUT CONTRAST TECHNIQUE: Contiguous axial images were obtained from the base of the skull through the vertex without intravenous contrast. COMPARISON:  CT head 10/06/2019  FINDINGS: Brain: Mild atrophy without hydrocephalus. Patchy white matter hypodensity bilaterally is unchanged and most compatible with chronic microvascular ischemia. Negative for acute infarct, hemorrhage, mass. Vascular: Negative for hyperdense vessel Skull: Negative Sinuses/Orbits: Mucosal edema and bony thickening left maxillary sinus. Mucosal edema left sphenoid sinus. Right-sided cataract extraction. Other: None IMPRESSION: No acute intracranial abnormality. Atrophy and chronic microvascular ischemic change in the white matter. Electronically Signed   By: Marlan Palau [name]   On: 10/09/2019 12:28   CARDIAC CATHETERIZATION  Result Date: 10/08/2019  1st Diag lesion is 60% stenosed.  Prox LAD to Mid LAD lesion is 10% stenosed.  2nd Diag lesion is 100% stenosed.  1st Mrg lesion is 100% stenosed.  Mid Cx to Dist Cx lesion is 100% stenosed.  Prox RCA to Mid RCA lesion is 100% stenosed.  Dist RCA lesion is 70% stenosed.  2nd Mrg lesion is 60% stenosed.  Post intervention, there is a 0% residual stenosis.  A drug-eluting stent was successfully  placed using a STENT RESOLUTE ONYX [id number].  1.  Significant underlying three-vessel coronary artery disease with patent stents in the LAD and diagonal with moderate in-stent restenosis in the diagonal stent.  Chronically occluded RCA stents with right to right bridging and left-to-right collaterals.  Diffuse small vessel disease.    The mid/distal left circumflex which was significantly diseased recently is now completely occluded which is the likely culprit for myocardial infarction.  This supplies relatively small size OM 3 which has moderate diffuse atherosclerosis. 2.  Left ventricular angiography was not performed.  EF was 25 to 30% by echo. 3.  Severely elevated left ventricular end-diastolic pressure at 33 mmHg 4.  Successful angioplasty and drug-eluting stent placement to the left circumflex.  Recommendations: I elected to intervene on the left circumflex given that troponin was still rising and the patient with residual chest pain. Dual antiplatelet therapy for at least 1 year. Resume heart failure medications. Resume furosemide as the patient is significantly volume overloaded. Possible discharge home tomorrow.   Medications:    aspirin EC  81 mg Oral Daily   carvedilol  6.25 mg Oral BID WC   enoxaparin (LOVENOX) injection  40 mg Subcutaneous Q24H   ezetimibe  10 mg Oral Daily   furosemide  40 mg Oral BID   insulin aspart  0-9 Units Subcutaneous Q6H   prasugrel  10 mg Oral Daily   sacubitril-valsartan  1 tablet Oral BID   sodium chloride flush  3 mL Intravenous Q12H   spironolactone  25 mg Oral Daily   Continuous Infusions:  sodium chloride       LOS: 3 days   Nysha Koplin  [facility]     10/09/2019, 3:37 PM

## 2019-10-09 NOTE — Progress Notes (Signed)
Requested that Dr. Myriam Forehand change blood sugar checks to ACHS rather than Q6 as patient would eat before blood sugar check is performed. Will cover patient with sliding scale after blood sugar is checked. MD is aware.

## 2019-10-09 NOTE — Progress Notes (Addendum)
Inpatient Diabetes Program Recommendations  AACE/ADA: New Consensus Statement on Inpatient Glycemic Control (2015)  Target Ranges:  Prepandial:   less than 140 mg/dL      Peak postprandial:   less than 180 mg/dL (1-2 hours)      Critically ill patients:  140 - 180 mg/dL   Lab Results  Component Value Date   GLUCAP 262 (H) 10/09/2019   HGBA1C 7.1 (H) 10/07/2019    Review of Glycemic Control  Diabetes history: None  Current orders for Inpatient glycemic control:  Lantus 10 units once Novolog 0-9 units Q6 hours  A1c 7.1%. meeting criteria for new Diabetes Diagnosis. Pt may benefit from Metformin at time of d/c due to lack of insurance.  Please inform pt of diagnosis and discuss d/c planning with pt. Will see pt once MD discussed with pt.  Thanks,  Christena Deem RN, MSN, BC-ADM Inpatient Diabetes Coordinator Team Pager [phone] (8a-5p)

## 2019-10-09 NOTE — Progress Notes (Addendum)
Patient fired [employer] as his nurse. Report received from [facility] who stated that patient had refused all of his PO medications from him. Lysle Morales entered them all as "not given". Patient is pleasant to me, and agrees to take his PO medications. Dr. Myriam Forehand informed of all this and notified that patient's medications were given, but late due to episode this AM. MD understands this. Will continue to monitor and assess patient throughout the day. Breathing is even and unlabored, no distress in noted. All safety measures are in place.

## 2019-10-09 NOTE — Progress Notes (Signed)
Alerted Dr. Myriam Forehand that patient is still complaining of dizziness and lightheadnedness. Orthostatics seem negative. No new orders from MD but to wait for CT results. Due to dizziness, bed alarm on, and requested patient call for assistance with ambulation. Will continue to monitor patient closely.

## 2019-10-10 DIAGNOSIS — I639 Cerebral infarction, unspecified: Secondary | ICD-10-CM

## 2019-10-10 LAB — LIPID PANEL
Cholesterol: 155 mg/dL (ref 0–200)
HDL: 48 mg/dL (ref >40)
LDL Cholesterol: 75 mg/dL (ref 0–99)
Total CHOL/HDL Ratio: 3.2 RATIO
Triglycerides: 162 mg/dL — ABNORMAL HIGH (ref <150)
VLDL: 32 mg/dL (ref 0–40)

## 2019-10-10 LAB — GLUCOSE, CAPILLARY
Glucose-Capillary: 162 mg/dL — ABNORMAL HIGH (ref 70–99)
Glucose-Capillary: 161 mg/dL — ABNORMAL HIGH (ref 70–99)
Glucose-Capillary: 300 mg/dL — ABNORMAL HIGH (ref 70–99)
Glucose-Capillary: 207 mg/dL — ABNORMAL HIGH (ref 70–99)

## 2019-10-10 MED ORDER — BISACODYL 5 MG PO TBEC
5.0000 mg | DELAYED_RELEASE_TABLET | Freq: Every day | ORAL | Status: DC | PRN
  Administered 2019-10-11: 5 mg via ORAL
  Filled 2019-10-10: qty 1

## 2019-10-10 MED ORDER — HYDROCODONE-ACETAMINOPHEN 5-325 MG PO TABS
1.0000 | ORAL_TABLET | Freq: Four times a day (QID) | ORAL | Status: DC | PRN
  Administered 2019-10-10: 1 via ORAL
  Filled 2019-10-10 (×2): qty 1

## 2019-10-10 MED ORDER — POLYETHYLENE GLYCOL 3350 17 G PO PACK
17.0000 g | PACK | Freq: Every day | ORAL | Status: DC
  Administered 2019-10-10 – 2019-10-11 (×2): 17 g via ORAL
  Filled 2019-10-10 (×2): qty 1

## 2019-10-10 MED ORDER — LIVING WELL WITH DIABETES BOOK
Freq: Once | Status: AC
  Filled 2019-10-10: qty 1

## 2019-10-10 NOTE — Progress Notes (Signed)
Progress Note  Patient Name: [name]. Date of Encounter: 10/10/2019  Primary Cardiologist: Kathlyn Sacramento, MD   Subjective   Patient states feeling okay, denies chest pain or shortness of breath.  Was dizzy yesterday prompting her imaging with MRI.  Inpatient Medications    Scheduled Meds: . aspirin EC  81 mg Oral Daily  . carvedilol  6.25 mg Oral BID WC  . enoxaparin (LOVENOX) injection  40 mg Subcutaneous Q24H  . ezetimibe  10 mg Oral Daily  . furosemide  40 mg Oral BID  . insulin aspart  0-9 Units Subcutaneous TID AC & HS  . polyethylene glycol  17 g Oral Daily  . prasugrel  10 mg Oral Daily  . sacubitril-valsartan  1 tablet Oral BID  . sodium chloride flush  3 mL Intravenous Q12H  . spironolactone  25 mg Oral Daily   Continuous Infusions: . sodium chloride     PRN Meds: sodium chloride, acetaminophen, ALPRAZolam, bisacodyl, HYDROcodone-acetaminophen, hydrocortisone, HYDROmorphone (DILAUDID) injection, meclizine, nitroGLYCERIN, nitroGLYCERIN, ondansetron (ZOFRAN) IV, sodium chloride flush, traMADol   Vital Signs    Vitals:   10/10/19 0635 10/10/19 0648 10/10/19 0727 10/10/19 1139  BP: 124/77  124/77 (!) 138/98  Pulse: (!) 56  (!) 49 (!) 57  Resp: 20  16 17   Temp:   97.8 F (36.6 C) 97.7 F (36.5 C)  TempSrc:      SpO2: 95%  94% 97%  Weight:  95.4 kg    Height:        Intake/Output Summary (Last 24 hours) at 10/10/2019 1238 Last data filed at 10/10/2019 1223 Gross per 24 hour  Intake 600 ml  Output 3000 ml  Net -2400 ml   Last 3 Weights 10/10/2019 10/10/2019 10/09/2019  Weight (lbs) 210 lb 4.8 oz 213 lb 1.6 oz 213 lb 6.4 oz  Weight (kg) 95.391 kg 96.662 kg 96.798 kg      Telemetry    Sinus rhythm, occasional PVCs- Personally Reviewed  ECG    New tracing obtained- Personally Reviewed  Physical Exam   GEN: No acute distress.   Neck: No JVD Cardiac: RRR, no murmurs, rubs, or gallops.  Respiratory:  Poor inspiratory effort.  Diminished breath  sounds at bases, clear anteriorly. GI: Soft, nontender, non-distended  MS: No edema; No deformity. Neuro:  Nonfocal  Psych: Normal affect   Labs    High Sensitivity Troponin:   Recent Labs  Lab 10/06/19 1835 10/06/19 2042 10/07/19 1235 10/07/19 1357  TROPONINIHS 36* 127* 10,336* 10,564*      Chemistry Recent Labs  Lab 10/06/19 1835 10/06/19 1835 10/07/19 0426 10/08/19 1513 10/09/19 0504  NA 139  --  139  --  137  K 3.5  --  3.6  --  3.9  CL 105  --  105  --  104  CO2 21*  --  23  --  23  GLUCOSE 296*  --  194*  --  312*  BUN 11  --  11  --  22  CREATININE 0.86   < > 0.84 0.82 0.87  CALCIUM 9.4  --  9.0  --  8.8*  GFRNONAA >60   < > >60 >60 >60  GFRAA >60   < > >60 >60 >60  ANIONGAP 13  --  11  --  10   < > = values in this interval not displayed.     Hematology Recent Labs  Lab 10/07/19 0426 10/08/19 0238 10/09/19 0504  WBC 9.1  9.8 13.7*  RBC 4.98 4.62 4.85  HGB 15.2 13.9 14.8  HCT 42.4 40.6 41.7  MCV 85.1 87.9 86.0  MCH 30.5 30.1 30.5  MCHC 35.8 34.2 35.5  RDW 15.1 14.8 14.4  PLT 212 170 204    BNPNo results for input(s): BNP, PROBNP in the last 168 hours.   DDimer No results for input(s): DDIMER in the last 168 hours.   Radiology    CT HEAD WO CONTRAST  Result Date: 10/09/2019 CLINICAL DATA:  Dizziness.  PVCs. EXAM: CT HEAD WITHOUT CONTRAST TECHNIQUE: Contiguous axial images were obtained from the base of the skull through the vertex without intravenous contrast. COMPARISON:  CT head 10/06/2019 FINDINGS: Brain: Mild atrophy without hydrocephalus. Patchy white matter hypodensity bilaterally is unchanged and most compatible with chronic microvascular ischemia. Negative for acute infarct, hemorrhage, mass. Vascular: Negative for hyperdense vessel Skull: Negative Sinuses/Orbits: Mucosal edema and bony thickening left maxillary sinus. Mucosal edema left sphenoid sinus. Right-sided cataract extraction. Other: None IMPRESSION: No acute intracranial  abnormality. Atrophy and chronic microvascular ischemic change in the white matter. Electronically Signed   By: Marlan Palau [name]   On: 10/09/2019 12:28   MR BRAIN WO CONTRAST  Result Date: 10/09/2019 CLINICAL DATA:  Initial evaluation for acute dizziness, headache. EXAM: MRI HEAD WITHOUT CONTRAST TECHNIQUE: Multiplanar, multiecho pulse sequences of the brain and surrounding structures were obtained without intravenous contrast. COMPARISON:  Prior head CT from earlier the same day. FINDINGS: Brain: Examination mildly degraded by motion artifact. Generalized age-related cerebral atrophy. Patchy and confluent T2/FLAIR hyperintensity within the periventricular and deep white matter both cerebral hemispheres most consistent with chronic small vessel ischemic disease, moderate nature. Few scattered superimposed remote lacunar infarcts present within the periventricular white matter bilaterally. Approximate 1 cm linear focus of restricted diffusion seen involving the inferior right temporal lobe, consistent with a small acute ischemic infarct (image 20, image 44 on coronal sequence, series 2, image 23 on axial sequence). No associated hemorrhage or mass effect. No other evidence for acute or subacute ischemia. Gray-white matter differentiation otherwise maintained. No acute intracranial hemorrhage. Single chronic microhemorrhage noted within the left temporal lobe, likely small vessel related. No mass lesion, midline shift or mass effect. No hydrocephalus or extra-axial fluid collection. Pituitary gland suprasellar region normal. Midline structures intact. Vascular: Major intracranial vascular flow voids are maintained. Skull and upper cervical spine: Craniocervical junction within normal limits. Bone marrow signal intensity normal. No scalp soft tissue abnormality. Sinuses/Orbits: Globes and orbital soft tissues demonstrate no acute finding. Patient status post ocular lens replacement on the right. Moderate mucosal  thickening present within the left sphenoid and maxillary sinuses. Mild to moderate mucosal thickening present within the left frontal sinus as well. No mastoid effusion. Inner ear structures grossly normal. Other: None. IMPRESSION: 1. 1 cm acute ischemic nonhemorrhagic right temporal lobe infarct. 2. Age-related cerebral atrophy with moderate chronic small vessel ischemic disease. 3. Left-sided inflammatory paranasal sinus disease. Electronically Signed   By: Rise Mu [name]   On: 10/09/2019 19:39   US Carotid Bilateral (at [facility] and [facility] only)  Result Date: 10/10/2019 CLINICAL DATA:  Stroke. History of hypertension, CAD, hyperlipidemia and smoking. EXAM: BILATERAL CAROTID DUPLEX ULTRASOUND TECHNIQUE: Wallace Cullens scale imaging, color Doppler and duplex ultrasound were performed of bilateral carotid and vertebral arteries in the neck. COMPARISON:  None. FINDINGS: Criteria: Quantification of carotid stenosis is based on velocity parameters that correlate the residual internal carotid diameter with NASCET-based stenosis levels, using the diameter of the distal internal carotid  lumen as the denominator for stenosis measurement. The following velocity measurements were obtained: RIGHT ICA: 56/16 cm/sec CCA: 53/9 cm/sec SYSTOLIC ICA/CCA RATIO:  1.1 ECA: 65 cm/sec LEFT ICA: 58/22 cm/sec CCA: 77/18 cm/sec SYSTOLIC ICA/CCA RATIO:  0.8 ECA: 128 cm/sec RIGHT CAROTID ARTERY: There is a minimal to moderate amount of eccentric echogenic plaque within the right carotid bulb (image 16), extending to involve the origin and proximal aspects the right internal carotid artery (image 22), not resulting in elevated peak systolic velocities within the interrogated course of the right internal carotid artery to suggest a hemodynamically significant stenosis. RIGHT VERTEBRAL ARTERY:  Antegrade flow LEFT CAROTID ARTERY: There is a minimal amount of eccentric echogenic plaque involving the proximal aspect the left internal carotid artery  (image 53), not resulting in elevated peak systolic velocities within the interrogated course of the left internal carotid artery to suggest a hemodynamically significant stenosis. LEFT VERTEBRAL ARTERY:  Antegrade flow IMPRESSION: Minimal to moderate amount of bilateral atherosclerotic plaque, right greater than left, not resulting in a hemodynamically significant stenosis within either internal carotid artery. Electronically Signed   By: Simonne Come [name]   On: 10/10/2019 07:40    Cardiac Studies   LHC/PCI (10/08/19): 1. Significant underlying three-vessel coronary artery disease with patent stents in the LAD and diagonal with moderate in-stent restenosis in the diagonal stent. Chronically occluded RCA stents with right to right bridging and left-to-right collaterals. Diffuse small vessel disease. The mid/distal left circumflex which was significantly diseased recently is now completely occluded which is the likely culprit for myocardial infarction. This supplies relatively small size OM 3 which has moderate diffuse atherosclerosis. 2. Left ventricular angiography was not performed. EF was 25 to 30% by echo. 3. Severely elevated left ventricular end-diastolic pressure at 33 mmHg 4. Successful angioplasty and drug-eluting stent placement to the left circumflex.  TTE (10/07/19): 1. Left ventricular ejection fraction, by estimation, is 25 to 30%. The  left ventricle has severely decreased function. The left ventricle  demonstrates global hypokinesis with severe hypokinesis of the inferior  wall and inferoapical region. The left  ventricular internal cavity size was moderately dilated. Left ventricular  diastolic parameters are indeterminate.  2. Right ventricular systolic function is normal. The right ventricular  size is normal. Tricuspid regurgitation signal is inadequate for assessing  PA pressure.  3. Left atrial size was mild to moderately dilated.   Patient Profile     64 y.o.  male with history of CAD status post prior stents to LAD diagonal and RCA presenting with chest pain, found to have NSTEMI.  Left heart cath showing an occluded left circumflex artery currently status post PCI to the left circumflex with a drug-eluting stent.  Assessment & Plan    1.  CAD status post PCI to the left circumflex, prior PCI's to LAD diagonal and RCA -Currently chest pain-free -Continue aspirin, Effient, Zetia. -PCSK9 can be incorporated as outpatient due to statin intolerance  2.  Ischemic cardiomyopathy, EF 25 to 30% -Continue Entresto, Coreg, spironolactone, Lasix 40 twice daily  3.  Dizziness, acute right temporal lobe infarct on MRI -On aspirin and prasugrel -Further input as per neurology  Total encounter time 35 minutes  Greater than 50% was spent in counseling and coordination of care with the patient. Medication review, explaining reasoning and findings of echo,cath, medication management.     Signed, Debbe Odea, MD  10/10/2019, 12:38 PM

## 2019-10-10 NOTE — Consult Note (Signed)
Reason for Consult: L toe numbness  Requesting Physician: DR. Denton Lank   CC: L toe numbness    HPI: [name]. is an 64 y.o. male male with medical history significant for coronary artery disease status post MI x3 with recent cardiac cath in February 2021 showing patent stents and severe triple-vessel disease managed with aggressive medical management, as well as history of systolic heart failure EF 25 to 35% on recent cath, secondary to ischemic cardiomyopathy, hypertension and HLD who presents to the emergency room with onset of typical chest pain on 10/06/19. Pt complained of L toe numbness and he is found to have 1 cm acute ischemic nonhemorrhagic right temporal lobe infarct.  Past Medical History:  Diagnosis Date  . Anginal pain (HCC)   . Arthritis   . CAD (coronary artery disease)    a. s/p MI with LAD and Diag stenting @ [name];  b. 06/2015 Cath: LAD 59m/d ISR, 100 RCA (ISR) w/ L->R collats, otw mod nonobs dzs-->Med Rx; c. 08/2019 Cath: LM nl, LAD 10p/m ISR, D1 20, D2 100, RI min irregs, LCX 84m/d, OM1 100, OM2 50, RCA 100p, 70d. RPDA fills via collats from LAD. EF 25-35%-->Med Rx.  . Chronic combined systolic and diastolic CHF (congestive heart failure) (HCC)    a. 06/2015 Echo: EF 20-25%, Gr3 DD; b. 04/2019 Echo: EF 40-45% w/ inf, infsept, ap ant, mid ant HK. Gr1 DD. Mild MR. Nl RV fxn.  [name] Dyspnea   . Essential hypertension   . Hyperlipidemia   . Hypokalemia    a. 06/2015 in setting of diuresis.  . Ischemic cardiomyopathy    a. 2011 EF 45% ([name]);  b. 06/2015 Echo: EF 20-25%; c. 04/2019 Echo: EF 40-45%; d. 08/2019 LV gram: EF 25-35%.  . Sleep apnea   . Type 2 diabetes mellitus with hyperglycemia (HCC) 10/08/2019    Past Surgical History:  Procedure Laterality Date  . BACK SURGERY  04/2019  . CARDIAC CATHETERIZATION N/A 06/10/2015   Procedure: Left Heart Cath;  Surgeon: Iran Ouch, MD;  Location: [facility] INVASIVE CV [facility];  Service: Cardiovascular;  Laterality: N/A;  . CORONARY  STENT INTERVENTION N/A 10/08/2019   Procedure: CORONARY STENT INTERVENTION;  Surgeon: Iran Ouch, MD;  Location: [facility];  Service: Cardiovascular;  Laterality: N/A;  . CORONARY STENT PLACEMENT    . LEFT HEART CATH AND CORONARY ANGIOGRAPHY N/A 10/08/2019   Procedure: LEFT HEART CATH AND CORONARY ANGIOGRAPHY poss pci;  Surgeon: Iran Ouch, MD;  Location: [facility];  Service: Cardiovascular;  Laterality: N/A;  . RIGHT/LEFT HEART CATH AND CORONARY ANGIOGRAPHY N/A 08/20/2019   Procedure: RIGHT/LEFT HEART CATH AND CORONARY ANGIOGRAPHY;  Surgeon: Iran Ouch, MD;  Location: [facility];  Service: Cardiovascular;  Laterality: N/A;    Family History  Problem Relation Age of Onset  . Coronary artery disease Mother   . Diabetes Mother   . Coronary artery disease Father     Social History:  reports that he quit smoking about 8 years ago. He has never used smokeless tobacco. He reports that he does not drink alcohol or use drugs.  Allergies  Allergen Reactions  . Contrast Media [Iodinated Diagnostic Agents] Shortness Of Breath  . Iohexol Shortness Of Breath     Onset Date: [id number]   . Atorvastatin     Myalgias   . Hydrocodone Itching  . Morphine And Related     Lost control   . Penicillins  Unknown reaction Did it involve swelling of the face/tongue/throat, SOB, or low BP? Unknown Did it involve sudden or severe rash/hives, skin peeling, or any reaction on the inside of your mouth or nose? Unknown Did you need to seek medical attention at a hospital or doctor's office? Yes When did it last happen?Childhood allergy  If all above answers are "NO", may proceed with cephalosporin use.   . Rosuvastatin     Myalgias     Medications: I have reviewed the patient's current medications.   Physical Examination: Blood pressure (!) 138/98, pulse (!) 57, temperature 97.7 F (36.5 C), resp. rate 17, height 5\' 6"  (1.676 m), weight 95.4 kg,  SpO2 97 %.    Neurological Examination   Mental Status: Alert, oriented, thought content appropriate.  Cranial Nerves: II: Discs flat bilaterally; Visual fields grossly normal, pupils equal, round, reactive to light and accommodation III,IV, VI: ptosis not present, extra-ocular motions intact bilaterally V,VII: smile symmetric, facial light touch sensation normal bilaterally VIII: hearing normal bilaterally IX,X: gag reflex present XI: bilateral shoulder shrug XII: midline tongue extension Motor: Generalized weakness  Sensory: L toe numbness       Laboratory Studies:   Basic Metabolic Panel: Recent Labs  Lab 10/06/19 1835 10/07/19 0426 10/08/19 1513 10/09/19 0504  NA 139 139  --  137  K 3.5 3.6  --  3.9  CL 105 105  --  104  CO2 21* 23  --  23  GLUCOSE 296* 194*  --  312*  BUN 11 11  --  22  CREATININE 0.86 0.84 0.82 0.87  CALCIUM 9.4 9.0  --  8.8*  MG  --   --   --  2.2    Liver Function Tests: No results for input(s): AST, ALT, ALKPHOS, BILITOT, PROT, ALBUMIN in the last 168 hours. No results for input(s): LIPASE, AMYLASE in the last 168 hours. No results for input(s): AMMONIA in the last 168 hours.  CBC: Recent Labs  Lab 10/06/19 1835 10/07/19 0426 10/08/19 0238 10/09/19 0504  WBC 11.8* 9.1 9.8 13.7*  HGB 16.6 15.2 13.9 14.8  HCT 45.4 42.4 40.6 41.7  MCV 83.0 85.1 87.9 86.0  PLT 238 212 170 204    Cardiac Enzymes: No results for input(s): CKTOTAL, CKMB, CKMBINDEX, TROPONINI in the last 168 hours.  BNP: Invalid input(s): POCBNP  CBG: Recent Labs  Lab 10/09/19 1830 10/09/19 2135 10/09/19 2155 10/10/19 0727 10/10/19 1137  GLUCAP 281* 236* 247* 162* 161*    Microbiology: Results for orders placed or performed during the hospital encounter of 10/06/19  Respiratory Panel by RT PCR (Flu A&B, Covid) - Nasopharyngeal Swab     Status: None   Collection Time: 10/06/19 11:38 PM   Specimen: Nasopharyngeal Swab  Result Value Ref Range Status    SARS Coronavirus 2 by RT PCR NEGATIVE NEGATIVE Final    Comment: (NOTE) SARS-CoV-2 target nucleic acids are NOT DETECTED. The SARS-CoV-2 RNA is generally detectable in upper respiratoy specimens during the acute phase of infection. The lowest concentration of SARS-CoV-2 viral copies this assay can detect is 131 copies/mL. A negative result does not preclude SARS-Cov-2 infection and should not be used as the sole basis for treatment or other patient management decisions. A negative result may occur with  improper specimen collection/handling, submission of specimen other than nasopharyngeal swab, presence of viral mutation(s) within the areas targeted by this assay, and inadequate number of viral copies (<131 copies/mL). A negative result must be combined with clinical observations,  patient history, and epidemiological information. The expected result is Negative. Fact Sheet for Patients:  PinkCheek.be Fact Sheet for Healthcare Providers:  GravelBags.it This test is not yet ap proved or cleared by the [place] FDA and  has been authorized for detection and/or diagnosis of SARS-CoV-2 by FDA under an Emergency Use Authorization (EUA). This EUA will remain  in effect (meaning this test can be used) for the duration of the COVID-19 declaration under Section 564(b)(1) of the Act, 21 U.S.C. section 360bbb-3(b)(1), unless the authorization is terminated or revoked sooner.    Influenza A by PCR NEGATIVE NEGATIVE Final   Influenza B by PCR NEGATIVE NEGATIVE Final    Comment: (NOTE) The Xpert Xpress SARS-CoV-2/FLU/RSV assay is intended as an aid in  the diagnosis of influenza from Nasopharyngeal swab specimens and  should not be used as a sole basis for treatment. Nasal washings and  aspirates are unacceptable for Xpert Xpress SARS-CoV-2/FLU/RSV  testing. Fact Sheet for Patients: PinkCheek.be Fact  Sheet for Healthcare Providers: GravelBags.it This test is not yet approved or cleared by the [place] FDA and  has been authorized for detection and/or diagnosis of SARS-CoV-2 by  FDA under an Emergency Use Authorization (EUA). This EUA will remain  in effect (meaning this test can be used) for the duration of the  Covid-19 declaration under Section 564(b)(1) of the Act, 21  U.S.C. section 360bbb-3(b)(1), unless the authorization is  terminated or revoked. Performed at [facility], [address] [place], [place] [address]     Coagulation Studies: No results for input(s): LABPROT, INR in the last 72 hours.  Urinalysis: No results for input(s): COLORURINE, LABSPEC, PHURINE, GLUCOSEU, HGBUR, BILIRUBINUR, KETONESUR, PROTEINUR, UROBILINOGEN, NITRITE, LEUKOCYTESUR in the last 168 hours.  Invalid input(s): APPERANCEUR  Lipid Panel:     Component Value Date/Time   CHOL 155 10/10/2019 0420   TRIG 162 (H) 10/10/2019 0420   HDL 48 10/10/2019 0420   CHOLHDL 3.2 10/10/2019 0420   VLDL 32 10/10/2019 0420   LDLCALC 75 10/10/2019 0420    HgbA1C:  Lab Results  Component Value Date   HGBA1C 7.1 (H) 10/07/2019    Urine Drug Screen:  No results found for: LABOPIA, COCAINSCRNUR, LABBENZ, AMPHETMU, THCU, LABBARB  Alcohol Level: No results for input(s): ETH in the last 168 hours.  Other results: EKG: normal EKG, normal sinus rhythm, unchanged from previous tracings.  Imaging: CT HEAD WO CONTRAST  Result Date: 10/09/2019 CLINICAL DATA:  Dizziness.  PVCs. EXAM: CT HEAD WITHOUT CONTRAST TECHNIQUE: Contiguous axial images were obtained from the base of the skull through the vertex without intravenous contrast. COMPARISON:  CT head 10/06/2019 FINDINGS: Brain: Mild atrophy without hydrocephalus. Patchy white matter hypodensity bilaterally is unchanged and most compatible with chronic microvascular ischemia. Negative for acute infarct, hemorrhage, mass.  Vascular: Negative for hyperdense vessel Skull: Negative Sinuses/Orbits: Mucosal edema and bony thickening left maxillary sinus. Mucosal edema left sphenoid sinus. Right-sided cataract extraction. Other: None IMPRESSION: No acute intracranial abnormality. Atrophy and chronic microvascular ischemic change in the white matter. Electronically Signed   By: Franchot Gallo [name]   On: 10/09/2019 12:28   MR BRAIN WO CONTRAST  Result Date: 10/09/2019 CLINICAL DATA:  Initial evaluation for acute dizziness, headache. EXAM: MRI HEAD WITHOUT CONTRAST TECHNIQUE: Multiplanar, multiecho pulse sequences of the brain and surrounding structures were obtained without intravenous contrast. COMPARISON:  Prior head CT from earlier the same day. FINDINGS: Brain: Examination mildly degraded by motion artifact. Generalized age-related cerebral atrophy. Patchy and  confluent T2/FLAIR hyperintensity within the periventricular and deep white matter both cerebral hemispheres most consistent with chronic small vessel ischemic disease, moderate nature. Few scattered superimposed remote lacunar infarcts present within the periventricular white matter bilaterally. Approximate 1 cm linear focus of restricted diffusion seen involving the inferior right temporal lobe, consistent with a small acute ischemic infarct (image 20, image 44 on coronal sequence, series 2, image 23 on axial sequence). No associated hemorrhage or mass effect. No other evidence for acute or subacute ischemia. Gray-white matter differentiation otherwise maintained. No acute intracranial hemorrhage. Single chronic microhemorrhage noted within the left temporal lobe, likely small vessel related. No mass lesion, midline shift or mass effect. No hydrocephalus or extra-axial fluid collection. Pituitary gland suprasellar region normal. Midline structures intact. Vascular: Major intracranial vascular flow voids are maintained. Skull and upper cervical spine: Craniocervical junction  within normal limits. Bone marrow signal intensity normal. No scalp soft tissue abnormality. Sinuses/Orbits: Globes and orbital soft tissues demonstrate no acute finding. Patient status post ocular lens replacement on the right. Moderate mucosal thickening present within the left sphenoid and maxillary sinuses. Mild to moderate mucosal thickening present within the left frontal sinus as well. No mastoid effusion. Inner ear structures grossly normal. Other: None. IMPRESSION: 1. 1 cm acute ischemic nonhemorrhagic right temporal lobe infarct. 2. Age-related cerebral atrophy with moderate chronic small vessel ischemic disease. 3. Left-sided inflammatory paranasal sinus disease. Electronically Signed   By: Rise Mu [name]   On: 10/09/2019 19:39   US Carotid Bilateral (at [facility] and [facility] only)  Result Date: 10/10/2019 CLINICAL DATA:  Stroke. History of hypertension, CAD, hyperlipidemia and smoking. EXAM: BILATERAL CAROTID DUPLEX ULTRASOUND TECHNIQUE: Wallace Cullens scale imaging, color Doppler and duplex ultrasound were performed of bilateral carotid and vertebral arteries in the neck. COMPARISON:  None. FINDINGS: Criteria: Quantification of carotid stenosis is based on velocity parameters that correlate the residual internal carotid diameter with NASCET-based stenosis levels, using the diameter of the distal internal carotid lumen as the denominator for stenosis measurement. The following velocity measurements were obtained: RIGHT ICA: 56/16 cm/sec CCA: 53/9 cm/sec SYSTOLIC ICA/CCA RATIO:  1.1 ECA: 65 cm/sec LEFT ICA: 58/22 cm/sec CCA: 77/18 cm/sec SYSTOLIC ICA/CCA RATIO:  0.8 ECA: 128 cm/sec RIGHT CAROTID ARTERY: There is a minimal to moderate amount of eccentric echogenic plaque within the right carotid bulb (image 16), extending to involve the origin and proximal aspects the right internal carotid artery (image 22), not resulting in elevated peak systolic velocities within the interrogated course of the right internal  carotid artery to suggest a hemodynamically significant stenosis. RIGHT VERTEBRAL ARTERY:  Antegrade flow LEFT CAROTID ARTERY: There is a minimal amount of eccentric echogenic plaque involving the proximal aspect the left internal carotid artery (image 53), not resulting in elevated peak systolic velocities within the interrogated course of the left internal carotid artery to suggest a hemodynamically significant stenosis. LEFT VERTEBRAL ARTERY:  Antegrade flow IMPRESSION: Minimal to moderate amount of bilateral atherosclerotic plaque, right greater than left, not resulting in a hemodynamically significant stenosis within either internal carotid artery. Electronically Signed   By: Simonne Come [name]   On: 10/10/2019 07:40     Assessment/Plan:  64 y.o. male male with medical history significant for coronary artery disease status post MI x3 with recent cardiac cath in February 2021 showing patent stents and severe triple-vessel disease managed with aggressive medical management, as well as history of systolic heart failure EF 25 to 35% on recent cath, secondary to ischemic cardiomyopathy, hypertension and HLD  who presents to the emergency room with onset of typical chest pain on 10/06/19. Pt complained of L toe numbness and he is found to have 1 cm acute ischemic nonhemorrhagic right temporal lobe infarct.   - already on antiplatelet therapy - small stroke in setting of HTN and small vessel disease - No further imaging or intervention from neurological standpoint - appreciate cardiology as patient can be on ASA and Prasugrel 10/10/2019, 12:08 PM

## 2019-10-10 NOTE — Progress Notes (Addendum)
PROGRESS NOTE    [name].   MHD:[id number]  DOB: [birth year]  PCP: [name], MD    DOA: 10/06/2019 LOS: 4   Brief Narrative   Glen Estelle Beau Iraheta. is an 64 y.o. male  with medical history significant forcoronary artery disease status post MI x3 with recent cardiac cath in February 2021 showing patent stents and severe triple-vessel disease managed with aggressive medical management, as well as history of systolic heart failure EF 25 to 35% on recent cath, secondary to ischemic cardiomyopathy, hypertension and HLD who presents to the emergency room with onset of typical chest pain, retrosternal radiating to the back, throat and left arm of intensity 10 out of 10, similar and somewhat worse than his prior heart attack. It was associated with diaphoresis and nausea.  Troponins were significantly elevated to 10,336.  He was diagnosed with acute NSTEMI.  He was treated with IV heparin infusion.  He was seen in consultation by the cardiologist.  He underwent left heart catheterization and a drug-eluting stent was placed to the left circumflex.  Dual antiplatelet therapy with aspirin and prasugrel for at least 1 year was recommended.  Hemoglobin A1c was 7.1 consistent with type 2 diabetes mellitus, which is a new diagnosis.  Patient complained of dizziness, prompting MRI brain on 4/6.  This showed a 1 cm acute right temporal lobe infarct.  Neurology consulted, agrees with the antiplatelet therapy and no further imaging or intervention indicated.  Assessment & Plan   Principal Problem:   NSTEMI (non-ST elevated myocardial infarction) [facility]) Active Problems:   Unstable angina (HCC)   CAD (coronary artery disease)   Essential hypertension   Hyperlipidemia   Chronic systolic heart failure (HCC)   Ischemic cardiomyopathy   Type 2 diabetes mellitus with hyperglycemia (HCC)   Chest pain, acute NSTEMI, headache and elevated troponins CAD (coronary artery disease) s/p MI x3, hx of  stents S/p left heart cath with placement of drug-eluting stent to left circumflex on 10/08/2019. --Dual antiplatelet therapy with aspirin and prasugrel --Patient intolerant to statins, continue Zetia --Cardiology following  Acute ischemic stroke -1 cm right temporal lobe acute infarct seen on MRI 10/09/2019.  Patient's only neurologic deficit is numbness of the left second toe.  Neurology was consulted and agrees with dual antiplatelet therapy.  No further studies or interventions indicated at this time.  Vertigo/dizziness- patient complains of dizziness which he describes as room spinning, with associated nausea and unsteady gait, consistent with benign paroxysmal positional vertigo.  Orthostatic vitals were unremarkable.  CT head was negative.  MRI did show a small acute infarct, this is likely unrelated to patient's dizziness. --Trial of meclizine --PT evaluation to assess safety with ambulation  Essential hypertension -Continue carvedilol as BP tolerates  Type 2 diabetes mellitus with hyperglycemia -apparently new diagnosis.  Hemoglobin A1c 7.1.   --Diabetes coordinator following --Sensitive sliding scale NovoLog --Likely discharge on Metformin --Close PCP follow-up  Hyperlipidemia -Continue Zetia  Chronic systolic heart failure (HCC) Ischemic cardiomyopathy -Continue Lasix, Entresto and Aldactone and Coreg  Acute on chronic low back pain -improved He is status post laminectomy in October 2021  Patient BMI: Body mass index is 33.94 kg/m.   DVT prophylaxis: Lovenox Diet:  Diet Orders (From admission, onward)    Start     Ordered   10/09/19 2347  Diet heart healthy/carb modified Room service appropriate? Yes; Fluid consistency: Thin  Diet effective now    Question Answer Comment  Diet-HS Snack? Nothing  Room service appropriate? Yes   Fluid consistency: Thin      10/09/19 2346            Code Status: Full Code    Subjective 10/10/19    Patient seen and  examined at bedside this morning.  No acute events reported overnight.  He reports feeling short of breath, states he cannot breathe, but appears in no distress and speaks in full sentences.  He denies chest pain.  Does report back pain.  His primary complaint today is dizziness she describes as room spinning.  States he is currently unable to sit up edge of bed because of it.  We discussed trial of meclizine and working with physical therapy today to see if it is helpful.  Patient subsequently declined to work with therapy today.   Disposition Plan & Communication   Dispo & Barriers: Expect discharge home tomorrow 4/8 pending PT evaluation and safe with ambulation given his vertigo. Coming from: Home Exp d/c date: 4/8 Medically stable for d/c?  No  Family Communication: None at bedside during encounter, will attempt to call   Consults, Procedures, Significant Events   Consultants:   Cardiology  Neurology  Dietitian  Diabetes coordinator  Procedures:   None  Antimicrobials:   None   Objective   Vitals:   10/10/19 0417 10/10/19 0635 10/10/19 0648 10/10/19 0727  BP: 128/79 124/77  124/77  Pulse: (!) 55 (!) 56  (!) 49  Resp: 20 20  16   Temp: 98.1 F (36.7 C)   97.8 F (36.6 C)  TempSrc: Oral     SpO2: 95% 95%  94%  Weight: 96.7 kg  95.4 kg   Height:        Intake/Output Summary (Last 24 hours) at 10/10/2019 0834 Last data filed at 10/10/2019 0727 Gross per 24 hour  Intake 480 ml  Output 2800 ml  Net -2320 ml   Filed Weights   10/09/19 0533 10/10/19 0417 10/10/19 0648  Weight: 96.8 kg 96.7 kg 95.4 kg    Physical Exam:  General exam: awake, alert, no acute distress, obese HEENT: moist mucus membranes, hearing grossly normal  Respiratory system: CTAB, no wheezes, rales or rhonchi, normal respiratory effort. Cardiovascular system: normal S1/S2, RRR, no pedal edema.   Central nervous system: A&O x4. no gross focal neurologic deficits, normal speech, gait not  evaluated due to dizziness Extremities: moves all, no edema, normal tone Skin: dry, intact, normal temperature, normal color Psychiatry: normal mood, congruent affect, judgement and insight appear normal  Labs   Data Reviewed: I have personally reviewed following labs and imaging studies  CBC: Recent Labs  Lab 10/06/19 1835 10/07/19 0426 10/08/19 0238 10/09/19 0504  WBC 11.8* 9.1 9.8 13.7*  HGB 16.6 15.2 13.9 14.8  HCT 45.4 42.4 40.6 41.7  MCV 83.0 85.1 87.9 86.0  PLT 238 212 170 204   Basic Metabolic Panel: Recent Labs  Lab 10/06/19 1835 10/07/19 0426 10/08/19 1513 10/09/19 0504  NA 139 139  --  137  K 3.5 3.6  --  3.9  CL 105 105  --  104  CO2 21* 23  --  23  GLUCOSE 296* 194*  --  312*  BUN 11 11  --  22  CREATININE 0.86 0.84 0.82 0.87  CALCIUM 9.4 9.0  --  8.8*  MG  --   --   --  2.2   GFR: Estimated Creatinine Clearance: 93.9 mL/min (by C-G formula based on SCr of 0.87 mg/dL).  Liver Function Tests: No results for input(s): AST, ALT, ALKPHOS, BILITOT, PROT, ALBUMIN in the last 168 hours. No results for input(s): LIPASE, AMYLASE in the last 168 hours. No results for input(s): AMMONIA in the last 168 hours. Coagulation Profile: Recent Labs  Lab 10/06/19 1836  INR 1.1   Cardiac Enzymes: No results for input(s): CKTOTAL, CKMB, CKMBINDEX, TROPONINI in the last 168 hours. BNP (last 3 results) No results for input(s): PROBNP in the last 8760 hours. HbA1C: No results for input(s): HGBA1C in the last 72 hours. CBG: Recent Labs  Lab 10/09/19 1150 10/09/19 1830 10/09/19 2135 10/09/19 2155 10/10/19 0727  GLUCAP 287* 281* 236* 247* 162*   Lipid Profile: Recent Labs    10/10/19 0420  CHOL 155  HDL 48  LDLCALC 75  TRIG 162*  CHOLHDL 3.2   Thyroid Function Tests: No results for input(s): TSH, T4TOTAL, FREET4, T3FREE, THYROIDAB in the last 72 hours. Anemia Panel: No results for input(s): VITAMINB12, FOLATE, FERRITIN, TIBC, IRON, RETICCTPCT in the last  72 hours. Sepsis Labs: No results for input(s): PROCALCITON, LATICACIDVEN in the last 168 hours.  Recent Results (from the past 240 hour(s))  Respiratory Panel by RT PCR (Flu A&B, Covid) - Nasopharyngeal Swab     Status: None   Collection Time: 10/06/19 11:38 PM   Specimen: Nasopharyngeal Swab  Result Value Ref Range Status   SARS Coronavirus 2 by RT PCR NEGATIVE NEGATIVE Final    Comment: (NOTE) SARS-CoV-2 target nucleic acids are NOT DETECTED. The SARS-CoV-2 RNA is generally detectable in upper respiratoy specimens during the acute phase of infection. The lowest concentration of SARS-CoV-2 viral copies this assay can detect is 131 copies/mL. A negative result does not preclude SARS-Cov-2 infection and should not be used as the sole basis for treatment or other patient management decisions. A negative result may occur with  improper specimen collection/handling, submission of specimen other than nasopharyngeal swab, presence of viral mutation(s) within the areas targeted by this assay, and inadequate number of viral copies (<131 copies/mL). A negative result must be combined with clinical observations, patient history, and epidemiological information. The expected result is Negative. Fact Sheet for Patients:  https://www.moore.com/ Fact Sheet for Healthcare Providers:  https://www.young.biz/ This test is not yet ap proved or cleared by the [place] FDA and  has been authorized for detection and/or diagnosis of SARS-CoV-2 by FDA under an Emergency Use Authorization (EUA). This EUA will remain  in effect (meaning this test can be used) for the duration of the COVID-19 declaration under Section 564(b)(1) of the Act, 21 U.S.C. section 360bbb-3(b)(1), unless the authorization is terminated or revoked sooner.    Influenza A by PCR NEGATIVE NEGATIVE Final   Influenza B by PCR NEGATIVE NEGATIVE Final    Comment: (NOTE) The Xpert Xpress  SARS-CoV-2/FLU/RSV assay is intended as an aid in  the diagnosis of influenza from Nasopharyngeal swab specimens and  should not be used as a sole basis for treatment. Nasal washings and  aspirates are unacceptable for Xpert Xpress SARS-CoV-2/FLU/RSV  testing. Fact Sheet for Patients: https://www.moore.com/ Fact Sheet for Healthcare Providers: https://www.young.biz/ This test is not yet approved or cleared by the [place] FDA and  has been authorized for detection and/or diagnosis of SARS-CoV-2 by  FDA under an Emergency Use Authorization (EUA). This EUA will remain  in effect (meaning this test can be used) for the duration of the  Covid-19 declaration under Section 564(b)(1) of the Act, 21  U.S.C. section 360bbb-3(b)(1), unless the authorization is  terminated or revoked. Performed at [facility], [address] [place], [place] [address]       Imaging Studies   CT HEAD WO CONTRAST  Result Date: 10/09/2019 CLINICAL DATA:  Dizziness.  PVCs. EXAM: CT HEAD WITHOUT CONTRAST TECHNIQUE: Contiguous axial images were obtained from the base of the skull through the vertex without intravenous contrast. COMPARISON:  CT head 10/06/2019 FINDINGS: Brain: Mild atrophy without hydrocephalus. Patchy white matter hypodensity bilaterally is unchanged and most compatible with chronic microvascular ischemia. Negative for acute infarct, hemorrhage, mass. Vascular: Negative for hyperdense vessel Skull: Negative Sinuses/Orbits: Mucosal edema and bony thickening left maxillary sinus. Mucosal edema left sphenoid sinus. Right-sided cataract extraction. Other: None IMPRESSION: No acute intracranial abnormality. Atrophy and chronic microvascular ischemic change in the white matter. Electronically Signed   By: Franchot Gallo [name]   On: 10/09/2019 12:28   MR BRAIN WO CONTRAST  Result Date: 10/09/2019 CLINICAL DATA:  Initial evaluation for acute dizziness,  headache. EXAM: MRI HEAD WITHOUT CONTRAST TECHNIQUE: Multiplanar, multiecho pulse sequences of the brain and surrounding structures were obtained without intravenous contrast. COMPARISON:  Prior head CT from earlier the same day. FINDINGS: Brain: Examination mildly degraded by motion artifact. Generalized age-related cerebral atrophy. Patchy and confluent T2/FLAIR hyperintensity within the periventricular and deep white matter both cerebral hemispheres most consistent with chronic small vessel ischemic disease, moderate nature. Few scattered superimposed remote lacunar infarcts present within the periventricular white matter bilaterally. Approximate 1 cm linear focus of restricted diffusion seen involving the inferior right temporal lobe, consistent with a small acute ischemic infarct (image 20, image 44 on coronal sequence, series 2, image 23 on axial sequence). No associated hemorrhage or mass effect. No other evidence for acute or subacute ischemia. Gray-white matter differentiation otherwise maintained. No acute intracranial hemorrhage. Single chronic microhemorrhage noted within the left temporal lobe, likely small vessel related. No mass lesion, midline shift or mass effect. No hydrocephalus or extra-axial fluid collection. Pituitary gland suprasellar region normal. Midline structures intact. Vascular: Major intracranial vascular flow voids are maintained. Skull and upper cervical spine: Craniocervical junction within normal limits. Bone marrow signal intensity normal. No scalp soft tissue abnormality. Sinuses/Orbits: Globes and orbital soft tissues demonstrate no acute finding. Patient status post ocular lens replacement on the right. Moderate mucosal thickening present within the left sphenoid and maxillary sinuses. Mild to moderate mucosal thickening present within the left frontal sinus as well. No mastoid effusion. Inner ear structures grossly normal. Other: None. IMPRESSION: 1. 1 cm acute ischemic  nonhemorrhagic right temporal lobe infarct. 2. Age-related cerebral atrophy with moderate chronic small vessel ischemic disease. 3. Left-sided inflammatory paranasal sinus disease. Electronically Signed   By: Jeannine Boga [name]   On: 10/09/2019 19:39   CARDIAC CATHETERIZATION  Result Date: 10/08/2019  1st Diag lesion is 60% stenosed.  Prox LAD to Mid LAD lesion is 10% stenosed.  2nd Diag lesion is 100% stenosed.  1st Mrg lesion is 100% stenosed.  Mid Cx to Dist Cx lesion is 100% stenosed.  Prox RCA to Mid RCA lesion is 100% stenosed.  Dist RCA lesion is 70% stenosed.  2nd Mrg lesion is 60% stenosed.  Post intervention, there is a 0% residual stenosis.  A drug-eluting stent was successfully placed using a STENT RESOLUTE ONYX [id number].  1.  Significant underlying three-vessel coronary artery disease with patent stents in the LAD and diagonal with moderate in-stent restenosis in the diagonal stent.  Chronically occluded RCA stents with right to right bridging and left-to-right collaterals.  Diffuse small vessel disease.    The mid/distal left circumflex which was significantly diseased recently is now completely occluded which is the likely culprit for myocardial infarction.  This supplies relatively small size OM 3 which has moderate diffuse atherosclerosis. 2.  Left ventricular angiography was not performed.  EF was 25 to 30% by echo. 3.  Severely elevated left ventricular end-diastolic pressure at 33 mmHg 4.  Successful angioplasty and drug-eluting stent placement to the left circumflex.  Recommendations: I elected to intervene on the left circumflex given that troponin was still rising and the patient with residual chest pain. Dual antiplatelet therapy for at least 1 year. Resume heart failure medications. Resume furosemide as the patient is significantly volume overloaded. Possible discharge home tomorrow.  US Carotid Bilateral (at [facility] and [facility] only)  Result Date: 10/10/2019 CLINICAL DATA:   Stroke. History of hypertension, CAD, hyperlipidemia and smoking. EXAM: BILATERAL CAROTID DUPLEX ULTRASOUND TECHNIQUE: Wallace Cullens scale imaging, color Doppler and duplex ultrasound were performed of bilateral carotid and vertebral arteries in the neck. COMPARISON:  None. FINDINGS: Criteria: Quantification of carotid stenosis is based on velocity parameters that correlate the residual internal carotid diameter with NASCET-based stenosis levels, using the diameter of the distal internal carotid lumen as the denominator for stenosis measurement. The following velocity measurements were obtained: RIGHT ICA: 56/16 cm/sec CCA: 53/9 cm/sec SYSTOLIC ICA/CCA RATIO:  1.1 ECA: 65 cm/sec LEFT ICA: 58/22 cm/sec CCA: 77/18 cm/sec SYSTOLIC ICA/CCA RATIO:  0.8 ECA: 128 cm/sec RIGHT CAROTID ARTERY: There is a minimal to moderate amount of eccentric echogenic plaque within the right carotid bulb (image 16), extending to involve the origin and proximal aspects the right internal carotid artery (image 22), not resulting in elevated peak systolic velocities within the interrogated course of the right internal carotid artery to suggest a hemodynamically significant stenosis. RIGHT VERTEBRAL ARTERY:  Antegrade flow LEFT CAROTID ARTERY: There is a minimal amount of eccentric echogenic plaque involving the proximal aspect the left internal carotid artery (image 53), not resulting in elevated peak systolic velocities within the interrogated course of the left internal carotid artery to suggest a hemodynamically significant stenosis. LEFT VERTEBRAL ARTERY:  Antegrade flow IMPRESSION: Minimal to moderate amount of bilateral atherosclerotic plaque, right greater than left, not resulting in a hemodynamically significant stenosis within either internal carotid artery. Electronically Signed   By: Simonne Come [name]   On: 10/10/2019 07:40     Medications   Scheduled Meds: . aspirin EC  81 mg Oral Daily  . carvedilol  6.25 mg Oral BID WC  .  enoxaparin (LOVENOX) injection  40 mg Subcutaneous Q24H  . ezetimibe  10 mg Oral Daily  . furosemide  40 mg Oral BID  . insulin aspart  0-9 Units Subcutaneous TID AC & HS  . prasugrel  10 mg Oral Daily  . sacubitril-valsartan  1 tablet Oral BID  . sodium chloride flush  3 mL Intravenous Q12H  . spironolactone  25 mg Oral Daily   Continuous Infusions: . sodium chloride         LOS: 4 days    Time spent: 40 minutes    Pennie Banter, DO [facility]   If 7PM-7AM, please contact night-coverage [email or web address] 10/10/2019, 8:34 AM

## 2019-10-10 NOTE — Progress Notes (Signed)
SLP Cancellation Note  Patient Details Name: [name]. MRN: [id number] DOB: 12-17-1955   Cancelled treatment:       Reason Eval/Treat Not Completed: SLP screened, no needs identified, will sign off(chart reviewed; consulted NSG then met w/ pt). Pt denied any difficulty swallowing and is currently on a regular diet; tolerates swallowing pills w/ water per NSG. Pt conversed in conversation answering SLP's general questions w/out deficits noted; pt and NSG denied any gross speech-language deficits. Pt stated he wanted to "sleep" and that his "only problem" was the "dizziness" he experiences.  No further skilled ST services indicated as pt appears grossly at his baseline. Pt to f/u w/ PCP if any decline in communication status is noted when he returns to ADLs at  home. Pt agreed. NSG to reconsult if any change in status while admitted.    Orinda Kenner, MS, CCC-SLP Kelliann Pendergraph 10/10/2019, 4:46 PM

## 2019-10-10 NOTE — Progress Notes (Signed)
PT Cancellation Note  Patient Details Name: [name]. MRN: [id number] DOB: 1956-01-31   Cancelled Treatment:    Reason Eval/Treat Not Completed: Other (comment). Pt currently agitated with care team and refusing to work with therapy services this date. Will re-attempt tomorrow.  [name] 10/10/2019, 9:00 AM  Elizabeth Palau, PT, DPT [phone]

## 2019-10-10 NOTE — Progress Notes (Signed)
Inpatient Diabetes Program Recommendations  AACE/ADA: New Consensus Statement on Inpatient Glycemic Control (2015)  Target Ranges:  Prepandial:   less than 140 mg/dL      Peak postprandial:   less than 180 mg/dL (1-2 hours)      Critically ill patients:  140 - 180 mg/dL   Lab Results  Component Value Date   GLUCAP 161 (H) 10/10/2019   HGBA1C 7.1 (H) 10/07/2019    Review of Glycemic Control  Diabetes history: New Onset DM2 Current orders for Inpatient glycemic control: Novolog moderate correction qid  Inpatient Diabetes Program Recommendations:   -Decrease hs Novolog correction to 0-5 units  Ordered Living Well with Diabetes and dietician consult regarding new onset diabetes. Will plan to speak with patient 10/11/19.  Thank you, [name]. Vannak Montenegro, RN, MSN, CDE  Diabetes Coordinator Inpatient Glycemic Control Team Team Pager [phone] (8am-5pm) 10/10/2019 2:48 PM

## 2019-10-10 NOTE — Evaluation (Signed)
Occupational Therapy Evaluation Patient Details Name: [name]. MRN: [id number] DOB: Feb 13, 1956 Today's Date: 10/10/2019    History of Present Illness [name]. is a 64 y.o. male with medical history significant for coronary artery disease status post MI x3 with recent cardiac cath in February 2021 showing patent stents and severe triple-vessel disease managed with aggressive medical management, as well as history of systolic heart failure EF 25 to 35% on recent cath, secondary to ischemic cardiomyopathy, hypertension and HLD who presents to the emergency room with onset of typical chest pain on 10/06/19. On arrival in the emergency room blood pressure was 206/85 with otherwise normal vitals. Troponins were significantly elevated to 10,336.  He was diagnosed with acute NSTEMI.  He was treated with IV heparin infusion and underwent left heart catheterization and a drug-eluting stent was placed to the left circumflex.  Patient admitted to telemetry.  Recent MRI shows: 1 cm acute ischemic nonhemorrhagic right temporal lobe infarct.   Clinical Impression   [name] was seen for OT evaluation this date. Pt received awake/alert, semi-supine in bed, and initially agreeable to OT evaluation. Pt reports he lives alone in a 1 level home with 3 steps to enter and no hand rails. Pt states his home has a sunken living space with one step down into this area. He also endorses 2 falls when using this step in the past year. He states both falls occurred immediately after a recent back procedure, but does not otherwise elaborate. Pt is independent with all ADL/IADL at baseline. He reports that he does not anticipate any difficulty with retuning home upon hospital DC, and feels he would be able to return to his PLOF without difficulty. Pt adamantly declines to participate in any functional mobility/ADL assessment this date. He tells this therapist "I'm not going anywhere, I'm dizzyheaded". When asked if he would  be agreeable to sitting EOB to perform formal strength assessment, pt becomes agitated and tells says to this author "What did, I just tell you? You can get the hell out of my room and I don't want to see you again!" Pt primary RN notified. Per primary RN, pt has been independent with toileting, grooming, and self-feeding tasks with not apparent strength, sensory, or visual deficits. RN notified OT will sign off at this time, per pt request. Pt appears to be back to baseline level of functional independence. No skilled acute OT needs identified during assessment. Will sign off. Please re-consult if additional OT needs arise during this admission.    Follow Up Recommendations  No OT follow up    Equipment Recommendations  None recommended by OT    Recommendations for Other Services       Precautions / Restrictions Precautions Precautions: Fall Precaution Comments: Moderate fall Restrictions Weight Bearing Restrictions: No      Mobility Bed Mobility Overal bed mobility: Modified Independent             General bed mobility comments: Pt observed to adjust position in bed independently. Declines to perform sitting EOB or additional bed mobility. Tells this therapist "I told you I'm not moving".  Transfers                 General transfer comment: Deferred. Pt adamantly declines despite education on importance of mobility.    Balance Overall balance assessment: No apparent balance deficits (not formally assessed)  ADL either performed or assessed with clinical judgement   ADL Overall ADL's : At baseline                                       General ADL Comments: Pt reports he feels back to his baseline level of independence to perform ADL tasks. Per RN, pt up ad lib to use the bathroom, does not require assist with self-feeding, peri-care, etc.     Vision Baseline Vision/History: Wears  glasses Wears Glasses: At all times Patient Visual Report: No change from baseline Additional Comments: Per pt report, pt declines formal testing this date.     Perception     Praxis      Pertinent Vitals/Pain Pain Assessment: 0-10 Pain Score: 6  Pain Location: Low back Pain Descriptors / Indicators: Aching;Sore Pain Intervention(s): Limited activity within patient's tolerance;Monitored during session;Patient requesting pain meds-RN notified     Hand Dominance Right   Extremity/Trunk Assessment Upper Extremity Assessment Upper Extremity Assessment: Overall WFL for tasks assessed(Overal WFLs, although pt declines to participate in fromal MMT. Is observed to use BUE functionally to reposition self in bed. Per RN, pt not demonstrating 1-sided weakness, and is independent with toilet transfers, etc.)   Lower Extremity Assessment Lower Extremity Assessment: Overall WFL for tasks assessed;Defer to PT evaluation   Cervical / Trunk Assessment Cervical / Trunk Assessment: Normal   Communication Communication Communication: No difficulties   Cognition Arousal/Alertness: Awake/alert Behavior During Therapy: Agitated Overall Cognitive Status: Within Functional Limits for tasks assessed                                 General Comments: Pt agitated with therapist requesting to test strength/fxl mobility. Abruptly ends session telling this therapist not to come back.   General Comments       Exercises     Shoulder Instructions      Home Living Family/patient expects to be discharged to:: Private residence Living Arrangements: Alone Available Help at Discharge: Family;Available PRN/intermittently(Pt reports son lives nearby) Type of Home: House Home Access: Stairs to enter [employer] of Steps: 3 Entrance Stairs-Rails: None Home Layout: One level;Other (Comment)(Pt reports house has sunken living space with one step down into.)     Bathroom  Shower/Tub: Tub/shower unit;Door   [employer] Toilet: Standard     Home Equipment: None          Prior Functioning/Environment Level of Independence: Independent        Comments: Pt endorses total independence with ADL/IADL management. 2 Falls in past year both when stepping down into his sunken living space.        OT Problem List: Decreased safety awareness;Cardiopulmonary status limiting activity;Decreased knowledge of use of DME or AE      OT Treatment/Interventions:      OT Goals(Current goals can be found in the care plan section) Acute Rehab OT Goals Patient Stated Goal: None stated OT Goal Formulation: All assessment and education complete, DC therapy Time For Goal Achievement: 10/10/19  OT Frequency:     Barriers to D/C:            Co-evaluation              AM-PAC OT "6 Clicks" Daily Activity     Outcome Measure Help from another person eating meals?: None Help from another  person taking care of personal grooming?: None Help from another person toileting, which includes using toliet, bedpan, or urinal?: None Help from another person bathing (including washing, rinsing, drying)?: None Help from another person to put on and taking off regular upper body clothing?: None Help from another person to put on and taking off regular lower body clothing?: None 6 Click Score: 24   End of Session Nurse Communication: Other (comment)(Pt ends session abruptly, agitated with therapist.)  Activity Tolerance: Treatment limited secondary to agitation Patient left: in bed;with call bell/phone within reach(Pt recieved with bed alarm off.)  OT Visit Diagnosis: Other abnormalities of gait and mobility (R26.89)                Time: 6962-9528 OT Time Calculation (min): 11 min Charges:  OT General Charges $OT Visit: 1 Visit OT Evaluation $OT Eval Moderate Complexity: 1 Mod  Rockney Ghee, M.S., OTR/L Ascom: [phone] 10/10/19, 9:39 AM

## 2019-10-11 ENCOUNTER — Ambulatory Visit: Payer: Self-pay | Admitting: Family

## 2019-10-11 LAB — GLUCOSE, CAPILLARY: Glucose-Capillary: 144 mg/dL — ABNORMAL HIGH (ref 70–99)

## 2019-10-11 LAB — BASIC METABOLIC PANEL
Anion gap: 10 (ref 5–15)
BUN: 24 mg/dL — ABNORMAL HIGH (ref 8–23)
CO2: 27 mmol/L (ref 22–32)
Calcium: 8.5 mg/dL — ABNORMAL LOW (ref 8.9–10.3)
Chloride: 101 mmol/L (ref 98–111)
Creatinine, Ser: 0.87 mg/dL (ref 0.61–1.24)
GFR calc Af Amer: >60 mL/min (ref >60)
GFR calc non Af Amer: >60 mL/min (ref >60)
Glucose, Bld: 163 mg/dL — ABNORMAL HIGH (ref 70–99)
Potassium: 3.6 mmol/L (ref 3.5–5.1)
Sodium: 138 mmol/L (ref 135–145)

## 2019-10-11 LAB — MAGNESIUM: Magnesium: 2.3 mg/dL (ref 1.7–2.4)

## 2019-10-11 LAB — CBC
HCT: 47.3 % (ref 39.0–52.0)
Hemoglobin: 16.4 g/dL (ref 13.0–17.0)
MCH: 30.4 pg (ref 26.0–34.0)
MCHC: 34.7 g/dL (ref 30.0–36.0)
MCV: 87.8 fL (ref 80.0–100.0)
Platelets: 267 10*3/uL (ref 150–400)
RBC: 5.39 MIL/uL (ref 4.22–5.81)
RDW: 14.7 % (ref 11.5–15.5)
WBC: 11.5 10*3/uL — ABNORMAL HIGH (ref 4.0–10.5)
nRBC: 0 % (ref 0.0–0.2)

## 2019-10-11 LAB — HEMOGLOBIN A1C
Hgb A1c MFr Bld: 7.3 % — ABNORMAL HIGH (ref 4.8–5.6)
Mean Plasma Glucose: 163 mg/dL

## 2019-10-11 MED ORDER — CLOPIDOGREL BISULFATE 75 MG PO TABS: 75.0000 mg | ORAL_TABLET | Freq: Every day | ORAL | 0 refills | Status: DC

## 2019-10-11 MED ORDER — CARVEDILOL 12.5 MG PO TABS: 12.5000 mg | ORAL_TABLET | Freq: Two times a day (BID) | ORAL | 0 refills | Status: DC

## 2019-10-11 MED ORDER — CARVEDILOL 12.5 MG PO TABS
12.5000 mg | ORAL_TABLET | Freq: Two times a day (BID) | ORAL | Status: DC
  Administered 2019-10-11: 12.5 mg via ORAL
  Filled 2019-10-11: qty 1

## 2019-10-11 MED ORDER — FUROSEMIDE 40 MG PO TABS: 40.0000 mg | ORAL_TABLET | Freq: Two times a day (BID) | ORAL | 0 refills | Status: DC

## 2019-10-11 MED ORDER — NITROGLYCERIN 0.4 MG SL SUBL: 0.4000 mg | SUBLINGUAL_TABLET | SUBLINGUAL | 0 refills | Status: DC | PRN

## 2019-10-11 MED ORDER — ACETAMINOPHEN 325 MG PO TABS: 650.0000 mg | ORAL_TABLET | Freq: Four times a day (QID) | ORAL | Status: DC | PRN

## 2019-10-11 MED ORDER — HYDROCODONE-ACETAMINOPHEN 7.5-325 MG PO TABS: 1.0000 | ORAL_TABLET | Freq: Four times a day (QID) | ORAL | 0 refills | Status: AC | PRN

## 2019-10-11 MED ORDER — CLOPIDOGREL BISULFATE 75 MG PO TABS
75.0000 mg | ORAL_TABLET | Freq: Every day | ORAL | Status: DC
  Administered 2019-10-11: 75 mg via ORAL
  Filled 2019-10-11: qty 1

## 2019-10-11 MED ORDER — MECLIZINE HCL 25 MG PO TABS: 25.0000 mg | ORAL_TABLET | Freq: Three times a day (TID) | ORAL | 0 refills | Status: DC | PRN

## 2019-10-11 NOTE — Progress Notes (Signed)
Progress Note  Patient Name: [name]. Date of Encounter: 10/11/2019  Primary Cardiologist: Kirke Corin  Subjective   No chest pain or dyspnea. Continues to note significant dizziness. Not happy with his diet order.   Inpatient Medications    Scheduled Meds: . aspirin EC  81 mg Oral Daily  . carvedilol  6.25 mg Oral BID WC  . clopidogrel  75 mg Oral Daily  . enoxaparin (LOVENOX) injection  40 mg Subcutaneous Q24H  . ezetimibe  10 mg Oral Daily  . furosemide  40 mg Oral BID  . insulin aspart  0-9 Units Subcutaneous TID AC & HS  . polyethylene glycol  17 g Oral Daily  . sacubitril-valsartan  1 tablet Oral BID  . sodium chloride flush  3 mL Intravenous Q12H  . spironolactone  25 mg Oral Daily   Continuous Infusions: . sodium chloride     PRN Meds: sodium chloride, acetaminophen, ALPRAZolam, bisacodyl, HYDROcodone-acetaminophen, hydrocortisone, HYDROmorphone (DILAUDID) injection, meclizine, nitroGLYCERIN, nitroGLYCERIN, ondansetron (ZOFRAN) IV, sodium chloride flush, traMADol   Vital Signs    Vitals:   10/10/19 2156 10/10/19 2200 10/11/19 0155 10/11/19 0407  BP: 126/60  119/76 128/78  Pulse:   (!) 58 61  Resp:  18 20   Temp:   97.8 F (36.6 C) 97.7 F (36.5 C)  TempSrc:   Oral Oral  SpO2:   94% 94%  Weight:    95.9 kg  Height:        Intake/Output Summary (Last 24 hours) at 10/11/2019 0726 Last data filed at 10/11/2019 0700 Gross per 24 hour  Intake 120 ml  Output 2150 ml  Net -2030 ml   Filed Weights   10/10/19 0417 10/10/19 0648 10/11/19 0407  Weight: 96.7 kg 95.4 kg 95.9 kg    Telemetry    SR with frequent PVCs in a pattern of bigeminy - Personally Reviewed  ECG    No new tracings - Personally Reviewed  Physical Exam   GEN: No acute distress.   Neck: No JVD. Cardiac: RRR with noted extra systoles, no murmurs, rubs, or gallops.  Respiratory: Clear to auscultation bilaterally.  GI: Soft, nontender, non-distended.   MS: No edema; No  deformity. Neuro:  Alert and oriented x 3; Nonfocal.  Psych: Normal affect.  Labs    Chemistry Recent Labs  Lab 10/07/19 0426 10/07/19 0426 10/08/19 1513 10/09/19 0504 10/11/19 0419  NA 139  --   --  137 138  K 3.6  --   --  3.9 3.6  CL 105  --   --  104 101  CO2 23  --   --  23 27  GLUCOSE 194*  --   --  312* 163*  BUN 11  --   --  22 24*  CREATININE 0.84   < > 0.82 0.87 0.87  CALCIUM 9.0  --   --  8.8* 8.5*  GFRNONAA >60   < > >60 >60 >60  GFRAA >60   < > >60 >60 >60  ANIONGAP 11  --   --  10 10   < > = values in this interval not displayed.     Hematology Recent Labs  Lab 10/08/19 0238 10/09/19 0504 10/11/19 0419  WBC 9.8 13.7* 11.5*  RBC 4.62 4.85 5.39  HGB 13.9 14.8 16.4  HCT 40.6 41.7 47.3  MCV 87.9 86.0 87.8  MCH 30.1 30.5 30.4  MCHC 34.2 35.5 34.7  RDW 14.8 14.4 14.7  PLT 170 204  267    Cardiac EnzymesNo results for input(s): TROPONINI in the last 168 hours. No results for input(s): TROPIPOC in the last 168 hours.   BNPNo results for input(s): BNP, PROBNP in the last 168 hours.   DDimer No results for input(s): DDIMER in the last 168 hours.   Radiology    CT HEAD WO CONTRAST  Result Date: 10/09/2019 IMPRESSION: No acute intracranial abnormality. Atrophy and chronic microvascular ischemic change in the white matter. Electronically Signed   By: Franchot Gallo [name]   On: 10/09/2019 12:28   MR BRAIN WO CONTRAST  Result Date: 10/09/2019 IMPRESSION: 1. 1 cm acute ischemic nonhemorrhagic right temporal lobe infarct. 2. Age-related cerebral atrophy with moderate chronic small vessel ischemic disease. 3. Left-sided inflammatory paranasal sinus disease. Electronically Signed   By: Jeannine Boga [name]   On: 10/09/2019 19:39   US Carotid Bilateral (at [facility] and [facility] only)  Result Date: 10/10/2019 IMPRESSION: Minimal to moderate amount of bilateral atherosclerotic plaque, right greater than left, not resulting in a hemodynamically significant stenosis within  either internal carotid artery. Electronically Signed   By: Sandi Mariscal [name]   On: 10/10/2019 07:40    Cardiac Studies   LHC 10/08/2019:  1st Diag lesion is 60% stenosed.  Prox LAD to Mid LAD lesion is 10% stenosed.  2nd Diag lesion is 100% stenosed.  1st Mrg lesion is 100% stenosed.  Mid Cx to Dist Cx lesion is 100% stenosed.  Prox RCA to Mid RCA lesion is 100% stenosed.  Dist RCA lesion is 70% stenosed.  2nd Mrg lesion is 60% stenosed.  Post intervention, there is a 0% residual stenosis.  A drug-eluting stent was successfully placed using a STENT RESOLUTE ONYX [id number].   1. Significant underlying three-vessel coronary artery disease with patent stents in the LAD and diagonal with moderate in-stent restenosis in the diagonal stent. Chronically occluded RCA stents with right to right bridging and left-to-right collaterals. Diffuse small vessel disease.   The mid/distal left circumflex which was significantly diseased recently is now completely occluded which is the likely culprit for myocardial infarction.  This supplies relatively small size OM 3 which has moderate diffuse atherosclerosis. 2.  Left ventricular angiography was not performed.  EF was 25 to 30% by echo. 3.  Severely elevated left ventricular end-diastolic pressure at 33 mmHg 4.  Successful angioplasty and drug-eluting stent placement to the left circumflex.  Recommendations: I elected to intervene on the left circumflex given that troponin was still rising and the patient with residual chest pain. Dual antiplatelet therapy for at least 1 year. Resume heart failure medications. Resume furosemide as the patient is significantly volume overloaded. Possible discharge home tomorrow. __________  2D echo 10/07/2019: 1. Left ventricular ejection fraction, by estimation, is 25 to 30%. The  left ventricle has severely decreased function. The left ventricle  demonstrates global hypokinesis with severe hypokinesis of  the inferior  wall and inferoapical region. The left  ventricular internal cavity size was moderately dilated. Left ventricular  diastolic parameters are indeterminate.  2. Right ventricular systolic function is normal. The right ventricular  size is normal. Tricuspid regurgitation signal is inadequate for assessing  PA pressure.  3. Left atrial size was mild to moderately dilated. __________  [facility] 09/2019: Normal sinus rhythm with an average heart rate of 70 bpm. 4 beat run of nonsustained ventricular tachycardia.  SVT with aberrancy cannot be excluded. 5 episodes of SVT the longest lasted 12 seconds. Rare PACs. Frequent PVCs with a burden  of 11.8%. __________  [facility] 08/20/2019:  Prox RCA to Mid RCA lesion is 100% stenosed.  Dist RCA lesion is 70% stenosed.  There is moderate to severe left ventricular systolic dysfunction.  LV end diastolic pressure is normal.  The left ventricular ejection fraction is 25-35% by visual estimate.  1st Mrg lesion is 100% stenosed.  2nd Mrg lesion is 50% stenosed.  Prox LAD to Mid LAD lesion is 10% stenosed.  1st Diag lesion is 20% stenosed.  2nd Diag lesion is 100% stenosed.  Mid Cx to Dist Cx lesion is 80% stenosed.   1.  Significant underlying three-vessel coronary artery disease with patent stents in the LAD and diagonal without significant restenosis.  Chronically occluded RCA stents with right to right bridging and left-to-right collaterals.  Diffuse small vessel disease.  Distal left circumflex stenosis seems worse than 2016 but this supplies relatively small size OM 3 which has moderate diffuse atherosclerosis. 2.  Moderately to severely reduced LV systolic function with an EF of 25 to 35%. 3.  Right heart catheterization showed normal filling pressures, mild pulmonary hypertension and normal cardiac output.  Recommendations: Recommend aggressive medical therapy for coronary artery disease.  The only potential area for  revascularization would be mid to distal left circumflex.  However, the supplied area is relatively small and diffusely diseased and I doubt that he will have significant improvement with this. Volume status appears to be good. Recommend aggressive treatment of heart failure and up titration of heart failure medications as tolerated.  Consider adding spironolactone upon follow-up.   Patient Profile     64 y.o. male with history of CAD s/p prior LAD, diagonal, and RCA stenting with subsequent known occlusion of the RCA, ischemic CM, chronic combined systolic and diastolic CHF with EF 25-35%, HTN, HLD, admitted with NSTEMI now status post PCI to occluded LCx complicated by post PCI ischemic CVA involving the right temporal lobe.   Assessment & Plan    1. CAD involving the native coronary arteries with NSTEMI: -Currently, chest pain free -DAPT with ASA and now Plavix as Effient is contraindicated with stroke -Continue Coreg and Zetia -Intolerant to high intensity statin with plans for PCSK-9 inhibitor as an outpatient -Cardiac rehab  -Post cath instructions   2. HFrEF possibly mixed ICM/NICM: -EF 25-30% by echo 10/07/2019 -Continue GDMT including Entresto, Coreg, spironolactone, and Lasix -Titrate Coreg to 12.5 mg bid -Cannot exclude some degree of PVC-related cardiomyopathy as well -Plan for follow up echo in ~ 3 months after he has been on maximally tolerated GDMT and percutaneous coronary revascularization, if his EF remains < 35% at that time plan for referral to EP for consideration of ICD -CHF education -Daily weights -Strict I/O  3. Acute CVA: -Noted on MRI brain 4/7 -Dizziness persists, pain medication may be contributing  -Effient has been changed to Plavix as above -Lipid management as below -Further management per primary   4. Frequent PVCs: -Titrate Coreg to 12.5 mg bid -Possibly contributing to his cardiomyopathy as well  -Magnesium at goal -Replete potassium to goal  of 4.0  5. HLD: -LDL of 75 this admission with goal being < 70 -Intolerant to high intensity statin -Continue Zetia -As an outpatient, recommend PCSK-9 inhibitor    For questions or updates, please contact [facility] Please consult [email or web address] for contact info under Cardiology/STEMI.    Signed, Eula Listen, PA-C [facility] Pager: [phone] 10/11/2019, 7:26 AM

## 2019-10-11 NOTE — Progress Notes (Signed)
Patient refusing home health. Pt states " I don't need it". CSW aware.

## 2019-10-11 NOTE — Discharge Summary (Signed)
Physician Discharge Summary  [name]. LFY:[id number] DOB: 17-Apr-1956 DOA: 10/06/2019 64  PCP: [name], MD  Admit date: 10/06/2019 Discharge date: 10/11/2019  Admitted From: home Disposition:  home  Recommendations for Outpatient Follow-up:  1. Follow up with PCP in 1-2 weeks 2. Please obtain BMP/CBC in one week 3. Please follow up with cardiology  Home Health: no Equipment/Devices: none  Discharge Condition: stable  CODE STATUS:  Diet recommendation: Heart Healthy / Carb Modified   Brief/Interim Summary:  Glen Jamesis an 64 y.o.malewith medical history significant forcoronary artery disease status post MI x3 with recent cardiac cath in February 2021 showing patent stents and severe triple-vessel disease managed with aggressive medical management, as well as history of systolic heart failure EF 25 to 35% on recent cath, secondary to ischemic cardiomyopathy, hypertension and HLD who presents to the emergency room with onset of typical chest pain, retrosternal radiating to the back, throat and left arm of intensity 10 out of 10, similar and somewhat worse than his prior heart attack. It was associated with diaphoresis and nausea. Troponins were significantly elevated to 10,336. He was diagnosed with acute NSTEMI. He was treated with IV heparin infusion. He was seen in consultation by the cardiologist. He underwent left heart catheterization and a drug-eluting stent was placed to the left circumflex. Dual antiplatelet therapy with aspirin and prasugrel for at least 1 year was recommended.   Hemoglobin A1c was 7.1 consistent with type 2 diabetes mellitus, which is a new diagnosis.  Patient complained of dizziness, prompting MRI brain on 4/6.  This showed a 1 cm acute right temporal lobe infarct.  Neurology consulted, agrees with the antiplatelet therapy and no further imaging or intervention indicated.   Chest pain, acute NSTEMI, headache and elevated troponins CAD  (coronary artery disease) s/p MI x3, hx of stents S/p left heart cath with placement of drug-eluting stent to left circumflex on 10/08/2019. --Dual antiplatelet therapy with aspirin and prasugrel --Patient intolerant to statins, continue Zetia --Cardiology follow up  Acute ischemic stroke -1 cm right temporal lobe acute infarct seen on MRI 10/09/2019.  Patient's only neurologic deficit is numbness of the left second toe.  Neurology was consulted and agrees with dual antiplatelet therapy.  No further studies or interventions indicated at this time.  Vertigo/dizziness- patient complains of dizziness which he describes as room spinning, with associated nausea and unsteady gait, consistent with benign paroxysmal positional vertigo.  Orthostatic vitals were unremarkable.  CT head was negative.  MRI did show a small acute infarct, this is likely unrelated to patient's dizziness. --Trial of meclizine --PT recommended home health PT which patient said he did not need, declined.  Essential hypertension -Continue carvedilol as BP tolerates  Type 2 diabetes mellituswith hyperglycemia -apparently new diagnosis.  Hemoglobin A1c 7.1.  --Diabetes coordinator following --Sensitive sliding scale NovoLog --Likely discharge on Metformin --Close PCP follow-up  Hyperlipidemia Continue Zetia  Chronic systolic heart failure (HCC) Ischemic cardiomyopathy Continue Lasix, Entresto and Aldactone and Coreg  Acute on chronic low back pain -improved. Status post laminectomy in October 2021  Patient BMI: Body mass index is 33.94 kg/m.    Discharge Diagnoses: Principal Problem:   NSTEMI (non-ST elevated myocardial infarction) [facility]) Active Problems:   Unstable angina (HCC)   CAD (coronary artery disease)   Essential hypertension   Hyperlipidemia   Chronic systolic heart failure (HCC)   Ischemic cardiomyopathy   Type 2 diabetes mellitus with hyperglycemia (HCC)   Ischemic stroke (HCC)  Discharge  Instructions   Discharge Instructions    (HEART FAILURE PATIENTS) Call MD:  Anytime you have any of the following symptoms: 1) 3 pound weight gain in 24 hours or 5 pounds in 1 week 2) shortness of breath, with or without a dry hacking cough 3) swelling in the hands, feet or stomach 4) if you have to sleep on extra pillows at night in order to breathe.   Complete by: As directed    AMB Referral to Cardiac Rehabilitation - Phase II   Complete by: As directed    Diagnosis: NSTEMI   After initial evaluation and assessments completed: Virtual Based Care may be provided alone or in conjunction with Phase 2 Cardiac Rehab based on patient barriers.: Yes   Call MD for:  extreme fatigue   Complete by: As directed    Call MD for:  persistant dizziness or light-headedness   Complete by: As directed    Call MD for:  severe uncontrolled pain   Complete by: As directed    Diet - low sodium heart healthy   Complete by: As directed    Discharge instructions   Complete by: As directed    D/C home once [facility] PT set up and walker   Increase activity slowly   Complete by: As directed      Allergies as of 10/11/2019      Reactions   Contrast Media [iodinated Diagnostic Agents] Shortness Of Breath   Iohexol Shortness Of Breath    Onset Date: [id number]   Atorvastatin    Myalgias   Hydrocodone Itching   Morphine And Related    Lost control    Penicillins    Unknown reaction Did it involve swelling of the face/tongue/throat, SOB, or low BP? Unknown Did it involve sudden or severe rash/hives, skin peeling, or any reaction on the inside of your mouth or nose? Unknown Did you need to seek medical attention at a hospital or doctor's office? Yes When did it last happen?Childhood allergy  If all above answers are "NO", may proceed with cephalosporin use.   Rosuvastatin    Myalgias      Medication List    TAKE these medications   acetaminophen 500 MG tablet Commonly known as: TYLENOL Take 1,000 mg  by mouth at bedtime as needed. At bedtime and as needed.   aspirin 81 MG chewable tablet Chew 1 tablet (81 mg total) by mouth daily.   carvedilol 12.5 MG tablet Commonly known as: COREG Take 1 tablet (12.5 mg total) by mouth 2 (two) times daily with a meal. What changed:   medication strength  how much to take  when to take this   clopidogrel 75 MG tablet Commonly known as: PLAVIX Take 1 tablet (75 mg total) by mouth daily. Start taking on: October 12, 2019   colchicine 0.6 MG tablet Take 1 tablet (0.6 mg total) by mouth daily as needed (gout).   ezetimibe 10 MG tablet Commonly known as: ZETIA Take 1 tablet (10 mg total) by mouth daily.   furosemide 40 MG tablet Commonly known as: LASIX Take 1 tablet (40 mg total) by mouth 2 (two) times daily. What changed:   medication strength  how much to take  when to take this   HYDROcodone-acetaminophen 7.5-325 MG tablet Commonly known as: Norco Take 1 tablet by mouth every 6 (six) hours as needed for up to 3 days for moderate pain.   JOCK ITCH RELIEF EX Apply 1 application topically daily as needed (  jock itch).   lip balm Oint Apply 1 application topically as needed for lip care.   meclizine 25 MG tablet Commonly known as: ANTIVERT Take 1 tablet (25 mg total) by mouth 3 (three) times daily as needed for dizziness.   nitroGLYCERIN 0.4 MG SL tablet Commonly known as: NITROSTAT Place 1 tablet (0.4 mg total) under the tongue every 5 (five) minutes as needed for chest pain.   sacubitril-valsartan 49-51 MG Commonly known as: ENTRESTO Take 1 tablet by mouth 2 (two) times daily.   spironolactone 25 MG tablet Commonly known as: ALDACTONE Take 1 tablet (25 mg total) by mouth daily.            Durable Medical Equipment  (From admission, onward)         Start     Ordered   10/11/19 0920  For home use only DME Walker rolling  Once    Question Answer Comment  Walker: With 5 Inch Wheels   Patient needs a walker to  treat with the following condition Dizziness      10/11/19 0919         Follow-up Information    [facility] Follow up on 10/17/2019.   Specialty: Cardiology Why: at 8:30am. Enter through the [facility] Mall entrance Contact information: [address] [place] [address] [phone]         Allergies  Allergen Reactions  . Contrast Media [Iodinated Diagnostic Agents] Shortness Of Breath  . Iohexol Shortness Of Breath     Onset Date: [id number]   . Atorvastatin     Myalgias   . Hydrocodone Itching  . Morphine And Related     Lost control   . Penicillins     Unknown reaction Did it involve swelling of the face/tongue/throat, SOB, or low BP? Unknown Did it involve sudden or severe rash/hives, skin peeling, or any reaction on the inside of your mouth or nose? Unknown Did you need to seek medical attention at a hospital or doctor's office? Yes When did it last happen?Childhood allergy  If all above answers are "NO", may proceed with cephalosporin use.   . Rosuvastatin     Myalgias     Consultations:  Cardiology  Neurology   Procedures/Studies: DG Chest 2 View  Result Date: 10/06/2019 CLINICAL DATA:  Chest pain EXAM: CHEST - 2 VIEW COMPARISON:  2016 FINDINGS: The heart size and mediastinal contours are within normal limits. Both lungs are clear. No pleural effusion or pneumothorax. No acute osseous abnormality. IMPRESSION: No acute process in the chest. Electronically Signed   By: Guadlupe Spanish [name]   On: 10/06/2019 19:17   CT HEAD WO CONTRAST  Result Date: 10/09/2019 CLINICAL DATA:  Dizziness.  PVCs. EXAM: CT HEAD WITHOUT CONTRAST TECHNIQUE: Contiguous axial images were obtained from the base of the skull through the vertex without intravenous contrast. COMPARISON:  CT head 10/06/2019 FINDINGS: Brain: Mild atrophy without hydrocephalus. Patchy white matter hypodensity bilaterally is unchanged  and most compatible with chronic microvascular ischemia. Negative for acute infarct, hemorrhage, mass. Vascular: Negative for hyperdense vessel Skull: Negative Sinuses/Orbits: Mucosal edema and bony thickening left maxillary sinus. Mucosal edema left sphenoid sinus. Right-sided cataract extraction. Other: None IMPRESSION: No acute intracranial abnormality. Atrophy and chronic microvascular ischemic change in the white matter. Electronically Signed   By: Marlan Palau [name]   On: 10/09/2019 12:28   CT Head Wo Contrast  Result Date: 10/06/2019 CLINICAL DATA:  Acute headache.  Severe headache with facial tightness, pain/numbness in left arm. EXAM: CT HEAD WITHOUT CONTRAST TECHNIQUE: Contiguous axial images were obtained from the base of the skull through the vertex without intravenous contrast. COMPARISON:  None. FINDINGS: Brain: Generalized cerebral atrophy. Moderate periventricular and deep white matter hypodensity most consistent with chronic small vessel ischemia. No evidence of acute infarction, hemorrhage, hydrocephalus, extra-axial collection or mass lesion/mass effect. Vascular: No hyperdense vessel. Skull: No fracture or focal lesion. Sinuses/Orbits: Mucosal thickening of left side of sphenoid sinus with mucous retention cyst. Mucosal thickening of left maxillary sinus with heterogeneous debris. Mild ethmoid sinus mucosal thickening on the left. Prior right cataract resection. Mastoid air cells are clear. Other: None. IMPRESSION: 1. Generalized atrophy and chronic small vessel ischemia. No acute intracranial abnormality. 2. Left paranasal sinus disease. Electronically Signed   By: Narda Rutherford [name]   On: 10/06/2019 19:59   MR BRAIN WO CONTRAST  Result Date: 10/09/2019 CLINICAL DATA:  Initial evaluation for acute dizziness, headache. EXAM: MRI HEAD WITHOUT CONTRAST TECHNIQUE: Multiplanar, multiecho pulse sequences of the brain and surrounding structures were obtained without intravenous contrast.  COMPARISON:  Prior head CT from earlier the same day. FINDINGS: Brain: Examination mildly degraded by motion artifact. Generalized age-related cerebral atrophy. Patchy and confluent T2/FLAIR hyperintensity within the periventricular and deep white matter both cerebral hemispheres most consistent with chronic small vessel ischemic disease, moderate nature. Few scattered superimposed remote lacunar infarcts present within the periventricular white matter bilaterally. Approximate 1 cm linear focus of restricted diffusion seen involving the inferior right temporal lobe, consistent with a small acute ischemic infarct (image 20, image 44 on coronal sequence, series 2, image 23 on axial sequence). No associated hemorrhage or mass effect. No other evidence for acute or subacute ischemia. Gray-white matter differentiation otherwise maintained. No acute intracranial hemorrhage. Single chronic microhemorrhage noted within the left temporal lobe, likely small vessel related. No mass lesion, midline shift or mass effect. No hydrocephalus or extra-axial fluid collection. Pituitary gland suprasellar region normal. Midline structures intact. Vascular: Major intracranial vascular flow voids are maintained. Skull and upper cervical spine: Craniocervical junction within normal limits. Bone marrow signal intensity normal. No scalp soft tissue abnormality. Sinuses/Orbits: Globes and orbital soft tissues demonstrate no acute finding. Patient status post ocular lens replacement on the right. Moderate mucosal thickening present within the left sphenoid and maxillary sinuses. Mild to moderate mucosal thickening present within the left frontal sinus as well. No mastoid effusion. Inner ear structures grossly normal. Other: None. IMPRESSION: 1. 1 cm acute ischemic nonhemorrhagic right temporal lobe infarct. 2. Age-related cerebral atrophy with moderate chronic small vessel ischemic disease. 3. Left-sided inflammatory paranasal sinus disease.  Electronically Signed   By: Rise Mu [name]   On: 10/09/2019 19:39   CARDIAC CATHETERIZATION  Result Date: 10/08/2019  1st Diag lesion is 60% stenosed.  Prox LAD to Mid LAD lesion is 10% stenosed.  2nd Diag lesion is 100% stenosed.  1st Mrg lesion is 100% stenosed.  Mid Cx to Dist Cx lesion is 100% stenosed.  Prox RCA to Mid RCA lesion is 100% stenosed.  Dist RCA lesion is 70% stenosed.  2nd Mrg lesion is 60% stenosed.  Post intervention, there is a 0% residual stenosis.  A drug-eluting stent was successfully placed using a STENT RESOLUTE ONYX [id number].  1.  Significant underlying three-vessel coronary artery disease with patent stents in the LAD and diagonal with moderate in-stent restenosis in the diagonal stent.  Chronically occluded RCA stents with right to right bridging and  left-to-right collaterals.  Diffuse small vessel disease.    The mid/distal left circumflex which was significantly diseased recently is now completely occluded which is the likely culprit for myocardial infarction.  This supplies relatively small size OM 3 which has moderate diffuse atherosclerosis. 2.  Left ventricular angiography was not performed.  EF was 25 to 30% by echo. 3.  Severely elevated left ventricular end-diastolic pressure at 33 mmHg 4.  Successful angioplasty and drug-eluting stent placement to the left circumflex.  Recommendations: I elected to intervene on the left circumflex given that troponin was still rising and the patient with residual chest pain. Dual antiplatelet therapy for at least 1 year. Resume heart failure medications. Resume furosemide as the patient is significantly volume overloaded. Possible discharge home tomorrow.  US Carotid Bilateral (at [facility] and [facility] only)  Result Date: 10/10/2019 CLINICAL DATA:  Stroke. History of hypertension, CAD, hyperlipidemia and smoking. EXAM: BILATERAL CAROTID DUPLEX ULTRASOUND TECHNIQUE: Wallace Cullens scale imaging, color Doppler and duplex ultrasound were  performed of bilateral carotid and vertebral arteries in the neck. COMPARISON:  None. FINDINGS: Criteria: Quantification of carotid stenosis is based on velocity parameters that correlate the residual internal carotid diameter with NASCET-based stenosis levels, using the diameter of the distal internal carotid lumen as the denominator for stenosis measurement. The following velocity measurements were obtained: RIGHT ICA: 56/16 cm/sec CCA: 53/9 cm/sec SYSTOLIC ICA/CCA RATIO:  1.1 ECA: 65 cm/sec LEFT ICA: 58/22 cm/sec CCA: 77/18 cm/sec SYSTOLIC ICA/CCA RATIO:  0.8 ECA: 128 cm/sec RIGHT CAROTID ARTERY: There is a minimal to moderate amount of eccentric echogenic plaque within the right carotid bulb (image 16), extending to involve the origin and proximal aspects the right internal carotid artery (image 22), not resulting in elevated peak systolic velocities within the interrogated course of the right internal carotid artery to suggest a hemodynamically significant stenosis. RIGHT VERTEBRAL ARTERY:  Antegrade flow LEFT CAROTID ARTERY: There is a minimal amount of eccentric echogenic plaque involving the proximal aspect the left internal carotid artery (image 53), not resulting in elevated peak systolic velocities within the interrogated course of the left internal carotid artery to suggest a hemodynamically significant stenosis. LEFT VERTEBRAL ARTERY:  Antegrade flow IMPRESSION: Minimal to moderate amount of bilateral atherosclerotic plaque, right greater than left, not resulting in a hemodynamically significant stenosis within either internal carotid artery. Electronically Signed   By: Simonne Come [name]   On: 10/10/2019 07:40   ECHOCARDIOGRAM COMPLETE  Result Date: 10/07/2019    ECHOCARDIOGRAM REPORT   Patient Name:   Boy Delamater. Date of Exam: 10/07/2019 Medical Rec #:  [id number]         Height:       66.0 in Accession #:    [id number]        Weight:       211.3 lb Date of Birth:  [birth year]          BSA:           2.047 m Patient Age:    64 years          BP:           125/50 mmHg Patient Gender: M                 HR:           60 bpm. Exam Location:  [facility] Procedure: 2D Echo and Intracardiac Opacification Agent Indications:     CAD Native Vessel 414.01/ I25.10  History:  Patient has prior history of Echocardiogram examinations, most                  recent 06/07/2015.  Sonographer:     Wonda Cerise RDCS Referring Phys:  [id number] Andris Baumann Diagnosing Phys: Julien Nordmann MD  Sonographer Comments: Technically difficult study due to poor echo windows. IMPRESSIONS  1. Left ventricular ejection fraction, by estimation, is 25 to 30%. The left ventricle has severely decreased function. The left ventricle demonstrates global hypokinesis with severe hypokinesis of the inferior wall and inferoapical region. The left ventricular internal cavity size was moderately dilated. Left ventricular diastolic parameters are indeterminate.  2. Right ventricular systolic function is normal. The right ventricular size is normal. Tricuspid regurgitation signal is inadequate for assessing PA pressure.  3. Left atrial size was mild to moderately dilated. FINDINGS  Left Ventricle: Left ventricular ejection fraction, by estimation, is 25 to 30%. The left ventricle has severely decreased function. The left ventricle demonstrates global hypokinesis. Definity contrast agent was given IV to delineate the left ventricular endocardial borders. The left ventricular internal cavity size was moderately dilated. There is no left ventricular hypertrophy. Left ventricular diastolic parameters are indeterminate. Right Ventricle: The right ventricular size is normal. No increase in right ventricular wall thickness. Right ventricular systolic function is normal. Tricuspid regurgitation signal is inadequate for assessing PA pressure. Left Atrium: Left atrial size was mild to moderately dilated. Right Atrium: Right atrial size was normal in size.  Pericardium: There is no evidence of pericardial effusion. Mitral Valve: The mitral valve is normal in structure. Normal mobility of the mitral valve leaflets. Trivial mitral valve regurgitation. No evidence of mitral valve stenosis. Tricuspid Valve: The tricuspid valve is normal in structure. Tricuspid valve regurgitation is not demonstrated. No evidence of tricuspid stenosis. Aortic Valve: The aortic valve was not well visualized. Aortic valve regurgitation is not visualized. No aortic stenosis is present. Aortic valve peak gradient measures 4.8 mmHg. Pulmonic Valve: The pulmonic valve was normal in structure. Pulmonic valve regurgitation is not visualized. No evidence of pulmonic stenosis. Aorta: The aortic root is normal in size and structure. Venous: The inferior vena cava is normal in size with greater than 50% respiratory variability, suggesting right atrial pressure of 3 mmHg. IAS/Shunts: No atrial level shunt detected by color flow Doppler.  LEFT VENTRICLE PLAX 2D LVIDd:         5.20 cm      Diastology LVIDs:         4.46 cm      LV e' lateral:   2.83 cm/s LV PW:         1.28 cm      LV E/e' lateral: 18.7 LV IVS:        2.19 cm      LV e' medial:    3.05 cm/s LVOT diam:     2.10 cm      LV E/e' medial:  17.3 LV SV:         58 LV SV Index:   28 LVOT Area:     3.46 cm  LV Volumes (MOD) LV vol d, MOD A4C: 236.0 ml LV vol s, MOD A4C: 175.5 ml LV SV MOD A4C:     236.0 ml RIGHT VENTRICLE RV Basal diam:  3.68 cm RV S prime:     22.40 cm/s TAPSE (M-mode): 2.9 cm LEFT ATRIUM              Index  RIGHT ATRIUM           Index LA diam:        4.80 cm  2.34 cm/m  RA Area:     15.60 cm LA Vol (A2C):   107.0 ml 52.27 ml/m RA Volume:   39.00 ml  19.05 ml/m LA Vol (A4C):   49.5 ml  24.18 ml/m LA Biplane Vol: 73.7 ml  36.00 ml/m  AORTIC VALVE                PULMONIC VALVE AV Area (Vmax): 2.51 cm    PV Vmax:       0.88 m/s AV Vmax:        110.00 cm/s PV Peak grad:  3.1 mmHg AV Peak Grad:   4.8 mmHg LVOT Vmax:       79.80 cm/s LVOT Vmean:     53.400 cm/s LVOT VTI:       0.168 m  AORTA Ao Root diam: 3.50 cm Ao Asc diam:  3.30 cm MITRAL VALVE MV Area (PHT): 3.12 cm    SHUNTS MV Decel Time: 243 msec    Systemic VTI:  0.17 m MV E velocity: 52.90 cm/s  Systemic Diam: 2.10 cm MV A velocity: 67.90 cm/s MV E/A ratio:  0.78 Julien Nordmann MD Electronically signed by Julien Nordmann MD Signature Date/Time: 10/07/2019/1:59:05 PM    Final    LONG TERM MONITOR (3-14 DAYS)  Result Date: 09/28/2019 3 days and 7 hours ZIO monitor: Normal sinus rhythm with an average heart rate of 70 bpm. 4 beat run of nonsustained ventricular tachycardia.  SVT with aberrancy cannot be excluded. 5 episodes of SVT the longest lasted 12 seconds. Rare PACs. Frequent PVCs with a burden of 11.8%.       Subjective: Patient seen this AM.  States ready to get home.  Denies chest pain or other complaints.  No acute events.   Discharge Exam: Vitals:   10/11/19 0734 10/11/19 0756  BP: (!) 130/59   Pulse: (!) 58 62  Resp: 18   Temp: 98.6 F (37 C)   SpO2: 93%    Vitals:   10/11/19 0155 10/11/19 0407 10/11/19 0734 10/11/19 0756  BP: 119/76 128/78 (!) 130/59   Pulse: (!) 58 61 (!) 58 62  Resp: 20  18   Temp: 97.8 F (36.6 C) 97.7 F (36.5 C) 98.6 F (37 C)   TempSrc: Oral Oral    SpO2: 94% 94% 93%   Weight:  95.9 kg    Height:        General: Pt is alert, awake, not in acute distress, obese Cardiovascular: RRR, S1/S2 +, no rubs, no gallops Respiratory: CTA bilaterally, no wheezing, no rhonchi Abdominal: Soft, NT, ND, bowel sounds + Extremities: no edema, no cyanosis    The results of significant diagnostics from this hospitalization (including imaging, microbiology, ancillary and laboratory) are listed below for reference.     Microbiology: Recent Results (from the past 240 hour(s))  Respiratory Panel by RT PCR (Flu A&B, Covid) - Nasopharyngeal Swab     Status: None   Collection Time: 10/06/19 11:38 PM   Specimen:  Nasopharyngeal Swab  Result Value Ref Range Status   SARS Coronavirus 2 by RT PCR NEGATIVE NEGATIVE Final    Comment: (NOTE) SARS-CoV-2 target nucleic acids are NOT DETECTED. The SARS-CoV-2 RNA is generally detectable in upper respiratoy specimens during the acute phase of infection. The lowest concentration of SARS-CoV-2 viral copies this assay can detect is 131 copies/mL.  A negative result does not preclude SARS-Cov-2 infection and should not be used as the sole basis for treatment or other patient management decisions. A negative result may occur with  improper specimen collection/handling, submission of specimen other than nasopharyngeal swab, presence of viral mutation(s) within the areas targeted by this assay, and inadequate number of viral copies (<131 copies/mL). A negative result must be combined with clinical observations, patient history, and epidemiological information. The expected result is Negative. Fact Sheet for Patients:  PinkCheek.be Fact Sheet for Healthcare Providers:  GravelBags.it This test is not yet ap proved or cleared by the [place] FDA and  has been authorized for detection and/or diagnosis of SARS-CoV-2 by FDA under an Emergency Use Authorization (EUA). This EUA will remain  in effect (meaning this test can be used) for the duration of the COVID-19 declaration under Section 564(b)(1) of the Act, 21 U.S.C. section 360bbb-3(b)(1), unless the authorization is terminated or revoked sooner.    Influenza A by PCR NEGATIVE NEGATIVE Final   Influenza B by PCR NEGATIVE NEGATIVE Final    Comment: (NOTE) The Xpert Xpress SARS-CoV-2/FLU/RSV assay is intended as an aid in  the diagnosis of influenza from Nasopharyngeal swab specimens and  should not be used as a sole basis for treatment. Nasal washings and  aspirates are unacceptable for Xpert Xpress SARS-CoV-2/FLU/RSV  testing. Fact Sheet for  Patients: PinkCheek.be Fact Sheet for Healthcare Providers: GravelBags.it This test is not yet approved or cleared by the [place] FDA and  has been authorized for detection and/or diagnosis of SARS-CoV-2 by  FDA under an Emergency Use Authorization (EUA). This EUA will remain  in effect (meaning this test can be used) for the duration of the  Covid-19 declaration under Section 564(b)(1) of the Act, 21  U.S.C. section 360bbb-3(b)(1), unless the authorization is  terminated or revoked. Performed at [facility], [address] [place], [place] [address]      Labs: BNP (last 3 results) No results for input(s): BNP in the last 8760 hours. Basic Metabolic Panel: Recent Labs  Lab 10/06/19 1835 10/07/19 0426 10/08/19 1513 10/09/19 0504 10/11/19 0419  NA 139 139  --  137 138  K 3.5 3.6  --  3.9 3.6  CL 105 105  --  104 101  CO2 21* 23  --  23 27  GLUCOSE 296* 194*  --  312* 163*  BUN 11 11  --  22 24*  CREATININE 0.86 0.84 0.82 0.87 0.87  CALCIUM 9.4 9.0  --  8.8* 8.5*  MG  --   --   --  2.2 2.3   Liver Function Tests: No results for input(s): AST, ALT, ALKPHOS, BILITOT, PROT, ALBUMIN in the last 168 hours. No results for input(s): LIPASE, AMYLASE in the last 168 hours. No results for input(s): AMMONIA in the last 168 hours. CBC: Recent Labs  Lab 10/06/19 1835 10/07/19 0426 10/08/19 0238 10/09/19 0504 10/11/19 0419  WBC 11.8* 9.1 9.8 13.7* 11.5*  HGB 16.6 15.2 13.9 14.8 16.4  HCT 45.4 42.4 40.6 41.7 47.3  MCV 83.0 85.1 87.9 86.0 87.8  PLT 238 212 170 204 267   Cardiac Enzymes: No results for input(s): CKTOTAL, CKMB, CKMBINDEX, TROPONINI in the last 168 hours. BNP: Invalid input(s): POCBNP CBG: Recent Labs  Lab 10/10/19 0727 10/10/19 1137 10/10/19 1628 10/10/19 2125 10/11/19 0735  GLUCAP 162* 161* 300* 207* 144*   D-Dimer No results for input(s): DDIMER in the last 72 hours. Hgb  A1c Recent  Labs    10/10/19 0420  HGBA1C 7.3*   Lipid Profile Recent Labs    10/10/19 0420  CHOL 155  HDL 48  LDLCALC 75  TRIG 162*  CHOLHDL 3.2   Thyroid function studies No results for input(s): TSH, T4TOTAL, T3FREE, THYROIDAB in the last 72 hours.  Invalid input(s): FREET3 Anemia work up No results for input(s): VITAMINB12, FOLATE, FERRITIN, TIBC, IRON, RETICCTPCT in the last 72 hours. Urinalysis No results found for: COLORURINE, APPEARANCEUR, LABSPEC, PHURINE, GLUCOSEU, HGBUR, BILIRUBINUR, KETONESUR, PROTEINUR, UROBILINOGEN, NITRITE, LEUKOCYTESUR Sepsis Labs Invalid input(s): PROCALCITONIN,  WBC,  LACTICIDVEN Microbiology Recent Results (from the past 240 hour(s))  Respiratory Panel by RT PCR (Flu A&B, Covid) - Nasopharyngeal Swab     Status: None   Collection Time: 10/06/19 11:38 PM   Specimen: Nasopharyngeal Swab  Result Value Ref Range Status   SARS Coronavirus 2 by RT PCR NEGATIVE NEGATIVE Final    Comment: (NOTE) SARS-CoV-2 target nucleic acids are NOT DETECTED. The SARS-CoV-2 RNA is generally detectable in upper respiratoy specimens during the acute phase of infection. The lowest concentration of SARS-CoV-2 viral copies this assay can detect is 131 copies/mL. A negative result does not preclude SARS-Cov-2 infection and should not be used as the sole basis for treatment or other patient management decisions. A negative result may occur with  improper specimen collection/handling, submission of specimen other than nasopharyngeal swab, presence of viral mutation(s) within the areas targeted by this assay, and inadequate number of viral copies (<131 copies/mL). A negative result must be combined with clinical observations, patient history, and epidemiological information. The expected result is Negative. Fact Sheet for Patients:  https://www.moore.com/ Fact Sheet for Healthcare Providers:  https://www.young.biz/ This test  is not yet ap proved or cleared by the [place] FDA and  has been authorized for detection and/or diagnosis of SARS-CoV-2 by FDA under an Emergency Use Authorization (EUA). This EUA will remain  in effect (meaning this test can be used) for the duration of the COVID-19 declaration under Section 564(b)(1) of the Act, 21 U.S.C. section 360bbb-3(b)(1), unless the authorization is terminated or revoked sooner.    Influenza A by PCR NEGATIVE NEGATIVE Final   Influenza B by PCR NEGATIVE NEGATIVE Final    Comment: (NOTE) The Xpert Xpress SARS-CoV-2/FLU/RSV assay is intended as an aid in  the diagnosis of influenza from Nasopharyngeal swab specimens and  should not be used as a sole basis for treatment. Nasal washings and  aspirates are unacceptable for Xpert Xpress SARS-CoV-2/FLU/RSV  testing. Fact Sheet for Patients: https://www.moore.com/ Fact Sheet for Healthcare Providers: https://www.young.biz/ This test is not yet approved or cleared by the [place] FDA and  has been authorized for detection and/or diagnosis of SARS-CoV-2 by  FDA under an Emergency Use Authorization (EUA). This EUA will remain  in effect (meaning this test can be used) for the duration of the  Covid-19 declaration under Section 564(b)(1) of the Act, 21  U.S.C. section 360bbb-3(b)(1), unless the authorization is  terminated or revoked. Performed at [facility], [address] [place], [place] [address]      Time coordinating discharge: Over 30 minutes  SIGNED:   Pennie Banter, DO [facility] 10/11/2019, 9:25 AM   If 7PM-7AM, please contact night-coverage [email or web address]

## 2019-10-11 NOTE — Evaluation (Signed)
Physical Therapy Evaluation Patient Details Name: [name]. MRN: [id number] DOB: 1955/12/17 Today's Date: 10/11/2019   History of Present Illness  [name]. is a 64 y.o. male with medical history significant for coronary artery disease status post MI x3 with recent cardiac cath in February 2021 showing patent stents and severe triple-vessel disease managed with aggressive medical management, as well as history of systolic heart failure EF 25 to 35% on recent cath, secondary to ischemic cardiomyopathy, hypertension and HLD who presents to the emergency room with onset of typical chest pain on 10/06/19. On arrival in the emergency room blood pressure was 206/85 with otherwise normal vitals. Troponins were significantly elevated to 10,336.  He was diagnosed with acute NSTEMI.  He was treated with IV heparin infusion and underwent left heart catheterization and a drug-eluting stent was placed to the left circumflex.  Patient admitted to telemetry.  Recent MRI shows: 1 cm acute ischemic nonhemorrhagic right temporal lobe infarct.  Clinical Impression  Pt initially not wanting to do much because of dizziness, but does agree to minimal in-room ambulation.  He showed good general strength and mobility, but was definitely unsteady with standing and needed hands on something the entire time (PT HHA, counter, wall, etc).  Discussed getting a RW for home, which he agrees with and also encouraged HHPT to continue addressing vestibular/balance.    Follow Up Recommendations Home health PT;Supervision for mobility/OOB    Equipment Recommendations  Rolling walker with 5" wheels    Recommendations for Other Services       Precautions / Restrictions Precautions Precautions: Fall Precaution Comments: Moderate fall Restrictions Weight Bearing Restrictions: No      Mobility  Bed Mobility Overal bed mobility: Modified Independent             General bed mobility comments: able to rise to  sitting confidently w/o assist  Transfers Overall transfer level: Modified independent Equipment used: None             General transfer comment: Stood and took a moment to get bearings, endorses some dizziness  Ambulation/Gait Ambulation/Gait assistance: Counsellor (Feet): 30 Feet Assistive device: 1 person hand held assist(reaching to walls, funiture, counter top)       General Gait Details: Pt with poor confidence, did not want AD but does endorse that idea that he needs UEs and therefore a walker may be appropriate moving forward.   Stairs            Wheelchair Mobility    Modified Rankin (Stroke Patients Only)       Balance Overall balance assessment: No apparent balance deficits (not formally assessed)                                           Pertinent Vitals/Pain Pain Assessment: No/denies pain    Home Living Family/patient expects to be discharged to:: Private residence Living Arrangements: Alone Available Help at Discharge: Family;Available PRN/intermittently Type of Home: House Home Access: Stairs to enter Entrance Stairs-Rails: None Entrance Stairs-Number of Steps: 3 Home Layout: One level;Other (Comment) Home Equipment: None      Prior Function Level of Independence: Independent         Comments: Pt endorses total independence with ADL/IADL management. 2 Falls in past year both when stepping down into his sunken living space.     Hand  Dominance   Dominant Hand: Right    Extremity/Trunk Assessment   Upper Extremity Assessment Upper Extremity Assessment: Overall WFL for tasks assessed;Generalized weakness    Lower Extremity Assessment Lower Extremity Assessment: Overall WFL for tasks assessed;Generalized weakness       Communication   Communication: No difficulties  Cognition Arousal/Alertness: Awake/alert Behavior During Therapy: Agitated Overall Cognitive Status: Within Functional Limits  for tasks assessed                                 General Comments: Pt agitated with therapist requesting to test strength/fxl mobility. Abruptly ends session telling this therapist not to come back.      General Comments General comments (skin integrity, edema, etc.): Pt c/o prolonged dizziness/lightheadedness that is not positionally dependent.      Exercises     Assessment/Plan    PT Assessment Patient needs continued PT services  PT Problem List Decreased balance;Decreased activity tolerance;Decreased mobility;Decreased safety awareness;Decreased knowledge of use of DME       PT Treatment Interventions DME instruction;Gait training;Stair training;Functional mobility training;Therapeutic activities;Therapeutic exercise;Balance training;Neuromuscular re-education;Patient/family education    PT Goals (Current goals can be found in the Care Plan section)  Acute Rehab PT Goals Patient Stated Goal: go home PT Goal Formulation: With patient Time For Goal Achievement: 10/25/19 Potential to Achieve Goals: Fair    Frequency Min 2X/week   Barriers to discharge        Co-evaluation               AM-PAC PT "6 Clicks" Mobility  Outcome Measure Help needed turning from your back to your side while in a flat bed without using bedrails?: None Help needed moving from lying on your back to sitting on the side of a flat bed without using bedrails?: None Help needed moving to and from a bed to a chair (including a wheelchair)?: A Little Help needed standing up from a chair using your arms (e.g., wheelchair or bedside chair)?: A Little Help needed to walk in hospital room?: A Little Help needed climbing 3-5 steps with a railing? : A Lot 6 Click Score: 19    End of Session Equipment Utilized During Treatment: Gait belt Activity Tolerance: Patient tolerated treatment well(limited due to dizziness) Patient left: in bed   PT Visit Diagnosis: Unsteadiness on feet  (R26.81);Dizziness and giddiness (R42)    Time: 0630-1601 PT Time Calculation (min) (ACUTE ONLY): 24 min   Charges:   PT Evaluation $PT Eval Low Complexity: 1 Low          Malachi Pro, DPT 10/11/2019, 12:23 PM

## 2019-10-11 NOTE — Progress Notes (Signed)
[name]. to be D/C'd Home per MD order.  Discussed prescriptions and follow up appointments with the patient. Prescriptions were e-prescribed. Pt verbalized understanding.  Allergies as of 10/11/2019      Reactions   Contrast Media [iodinated Diagnostic Agents] Shortness Of Breath   Iohexol Shortness Of Breath    Onset Date: [id number]   Atorvastatin    Myalgias   Hydrocodone Itching   Morphine And Related    Lost control    Penicillins    Unknown reaction Did it involve swelling of the face/tongue/throat, SOB, or low BP? Unknown Did it involve sudden or severe rash/hives, skin peeling, or any reaction on the inside of your mouth or nose? Unknown Did you need to seek medical attention at a hospital or doctor's office? Yes When did it last happen?Childhood allergy  If all above answers are "NO", may proceed with cephalosporin use.   Rosuvastatin    Myalgias      Medication List    TAKE these medications   acetaminophen 500 MG tablet Commonly known as: TYLENOL Take 1,000 mg by mouth at bedtime as needed. At bedtime and as needed.   aspirin 81 MG chewable tablet Chew 1 tablet (81 mg total) by mouth daily.   carvedilol 12.5 MG tablet Commonly known as: COREG Take 1 tablet (12.5 mg total) by mouth 2 (two) times daily with a meal. What changed:   medication strength  how much to take  when to take this   clopidogrel 75 MG tablet Commonly known as: PLAVIX Take 1 tablet (75 mg total) by mouth daily. Start taking on: October 12, 2019   colchicine 0.6 MG tablet Take 1 tablet (0.6 mg total) by mouth daily as needed (gout).   ezetimibe 10 MG tablet Commonly known as: ZETIA Take 1 tablet (10 mg total) by mouth daily.   furosemide 40 MG tablet Commonly known as: LASIX Take 1 tablet (40 mg total) by mouth 2 (two) times daily. What changed:   medication strength  how much to take  when to take this   HYDROcodone-acetaminophen 7.5-325 MG tablet Commonly  known as: Norco Take 1 tablet by mouth every 6 (six) hours as needed for up to 3 days for moderate pain.   JOCK ITCH RELIEF EX Apply 1 application topically daily as needed (jock itch).   lip balm Oint Apply 1 application topically as needed for lip care.   meclizine 25 MG tablet Commonly known as: ANTIVERT Take 1 tablet (25 mg total) by mouth 3 (three) times daily as needed for dizziness.   nitroGLYCERIN 0.4 MG SL tablet Commonly known as: NITROSTAT Place 1 tablet (0.4 mg total) under the tongue every 5 (five) minutes as needed for chest pain.   sacubitril-valsartan 49-51 MG Commonly known as: ENTRESTO Take 1 tablet by mouth 2 (two) times daily.   spironolactone 25 MG tablet Commonly known as: ALDACTONE Take 1 tablet (25 mg total) by mouth daily.            Durable Medical Equipment  (From admission, onward)         Start     Ordered   10/11/19 0920  For home use only DME Walker rolling  Once    Question Answer Comment  Walker: With 5 Inch Wheels   Patient needs a walker to treat with the following condition Dizziness      10/11/19 0919          Vitals:   10/11/19 0734 10/11/19  0756  BP: (!) 130/59   Pulse: (!) 58 62  Resp: 18   Temp: 98.6 F (37 C)   SpO2: 93%     Tele box removed and returned. Skin clean, dry and intact without evidence of skin break down,  IV catheter discontinued intact. Site without signs and symptoms of complications. Dressing and pressure applied. Pt denies pain at this time. No complaints noted.  An After Visit Summary was printed and given to the patient. Patient escorted via [place], and D/C home via private auto.  [name]

## 2019-10-12 ENCOUNTER — Ambulatory Visit: Payer: Self-pay | Admitting: Family
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
## 2019-10-17 ENCOUNTER — Encounter: Payer: Self-pay | Admitting: Emergency Medicine

## 2019-10-17 ENCOUNTER — Emergency Department
Admission: EM | Admit: 2019-10-17 | Discharge: 2019-10-17 | Disposition: A | Discharge status: Home / Self Care | Payer: No Typology Code available for payment source | Attending: Emergency Medicine | Admitting: Emergency Medicine

## 2019-10-17 ENCOUNTER — Ambulatory Visit: Payer: Self-pay | Attending: Family | Admitting: Family

## 2019-10-17 ENCOUNTER — Emergency Department: Payer: No Typology Code available for payment source

## 2019-10-17 ENCOUNTER — Other Ambulatory Visit: Payer: Self-pay

## 2019-10-17 ENCOUNTER — Encounter: Payer: Self-pay | Admitting: Family

## 2019-10-17 VITALS — BP 150/89 | HR 56 | Resp 18 | Ht 66.0 in | Wt 211.4 lb

## 2019-10-17 DIAGNOSIS — M549 Dorsalgia, unspecified: Secondary | ICD-10-CM | POA: Insufficient documentation

## 2019-10-17 DIAGNOSIS — Y93I9 Activity, other involving external motion: Secondary | ICD-10-CM | POA: Diagnosis not present

## 2019-10-17 DIAGNOSIS — Z7982 Long term (current) use of aspirin: Secondary | ICD-10-CM | POA: Insufficient documentation

## 2019-10-17 DIAGNOSIS — Z885 Allergy status to narcotic agent status: Secondary | ICD-10-CM | POA: Insufficient documentation

## 2019-10-17 DIAGNOSIS — Z8249 Family history of ischemic heart disease and other diseases of the circulatory system: Secondary | ICD-10-CM | POA: Insufficient documentation

## 2019-10-17 DIAGNOSIS — Y999 Unspecified external cause status: Secondary | ICD-10-CM | POA: Insufficient documentation

## 2019-10-17 DIAGNOSIS — Z888 Allergy status to other drugs, medicaments and biological substances status: Secondary | ICD-10-CM | POA: Insufficient documentation

## 2019-10-17 DIAGNOSIS — Z7984 Long term (current) use of oral hypoglycemic drugs: Secondary | ICD-10-CM | POA: Diagnosis not present

## 2019-10-17 DIAGNOSIS — I5022 Chronic systolic (congestive) heart failure: Secondary | ICD-10-CM | POA: Diagnosis not present

## 2019-10-17 DIAGNOSIS — I2510 Atherosclerotic heart disease of native coronary artery without angina pectoris: Secondary | ICD-10-CM | POA: Insufficient documentation

## 2019-10-17 DIAGNOSIS — E119 Type 2 diabetes mellitus without complications: Secondary | ICD-10-CM | POA: Diagnosis not present

## 2019-10-17 DIAGNOSIS — Z87891 Personal history of nicotine dependence: Secondary | ICD-10-CM | POA: Insufficient documentation

## 2019-10-17 DIAGNOSIS — Z79899 Other long term (current) drug therapy: Secondary | ICD-10-CM | POA: Insufficient documentation

## 2019-10-17 DIAGNOSIS — I110 Hypertensive heart disease with heart failure: Secondary | ICD-10-CM | POA: Diagnosis not present

## 2019-10-17 DIAGNOSIS — R001 Bradycardia, unspecified: Secondary | ICD-10-CM | POA: Insufficient documentation

## 2019-10-17 DIAGNOSIS — E785 Hyperlipidemia, unspecified: Secondary | ICD-10-CM | POA: Insufficient documentation

## 2019-10-17 DIAGNOSIS — I255 Ischemic cardiomyopathy: Secondary | ICD-10-CM | POA: Insufficient documentation

## 2019-10-17 DIAGNOSIS — R0683 Snoring: Secondary | ICD-10-CM | POA: Insufficient documentation

## 2019-10-17 DIAGNOSIS — Z833 Family history of diabetes mellitus: Secondary | ICD-10-CM | POA: Insufficient documentation

## 2019-10-17 DIAGNOSIS — S39012A Strain of muscle, fascia and tendon of lower back, initial encounter: Secondary | ICD-10-CM | POA: Diagnosis not present

## 2019-10-17 DIAGNOSIS — Z91041 Radiographic dye allergy status: Secondary | ICD-10-CM | POA: Insufficient documentation

## 2019-10-17 DIAGNOSIS — M1990 Unspecified osteoarthritis, unspecified site: Secondary | ICD-10-CM | POA: Insufficient documentation

## 2019-10-17 DIAGNOSIS — Z880 Allergy status to penicillin: Secondary | ICD-10-CM | POA: Insufficient documentation

## 2019-10-17 DIAGNOSIS — I5042 Chronic combined systolic (congestive) and diastolic (congestive) heart failure: Secondary | ICD-10-CM | POA: Insufficient documentation

## 2019-10-17 DIAGNOSIS — I252 Old myocardial infarction: Secondary | ICD-10-CM | POA: Insufficient documentation

## 2019-10-17 DIAGNOSIS — Z955 Presence of coronary angioplasty implant and graft: Secondary | ICD-10-CM | POA: Insufficient documentation

## 2019-10-17 DIAGNOSIS — Y92411 Interstate highway as the place of occurrence of the external cause: Secondary | ICD-10-CM | POA: Diagnosis not present

## 2019-10-17 DIAGNOSIS — R519 Headache, unspecified: Secondary | ICD-10-CM | POA: Insufficient documentation

## 2019-10-17 DIAGNOSIS — I10 Essential (primary) hypertension: Secondary | ICD-10-CM

## 2019-10-17 DIAGNOSIS — G8929 Other chronic pain: Secondary | ICD-10-CM | POA: Insufficient documentation

## 2019-10-17 DIAGNOSIS — G4730 Sleep apnea, unspecified: Secondary | ICD-10-CM | POA: Insufficient documentation

## 2019-10-17 DIAGNOSIS — Z7902 Long term (current) use of antithrombotics/antiplatelets: Secondary | ICD-10-CM | POA: Insufficient documentation

## 2019-10-17 LAB — CBC WITH DIFFERENTIAL/PLATELET
Abs Immature Granulocytes: 0.03 10*3/uL (ref 0.00–0.07)
Basophils Absolute: 0 10*3/uL (ref 0.0–0.1)
Basophils Relative: 1 %
Eosinophils Absolute: 0.3 10*3/uL (ref 0.0–0.5)
Eosinophils Relative: 4 %
HCT: 45.4 % (ref 39.0–52.0)
Hemoglobin: 16.1 g/dL (ref 13.0–17.0)
Immature Granulocytes: 0 %
Lymphocytes Relative: 27 %
Lymphs Abs: 2 10*3/uL (ref 0.7–4.0)
MCH: 30.7 pg (ref 26.0–34.0)
MCHC: 35.5 g/dL (ref 30.0–36.0)
MCV: 86.6 fL (ref 80.0–100.0)
Monocytes Absolute: 0.8 10*3/uL (ref 0.1–1.0)
Monocytes Relative: 11 %
Neutro Abs: 4.2 10*3/uL (ref 1.7–7.7)
Neutrophils Relative %: 57 %
Platelets: 251 10*3/uL (ref 150–400)
RBC: 5.24 MIL/uL (ref 4.22–5.81)
RDW: 14 % (ref 11.5–15.5)
WBC: 7.3 10*3/uL (ref 4.0–10.5)
nRBC: 0 % (ref 0.0–0.2)

## 2019-10-17 LAB — COMPREHENSIVE METABOLIC PANEL
ALT: 13 U/L (ref 0–44)
AST: 17 U/L (ref 15–41)
Albumin: 4 g/dL (ref 3.5–5.0)
Alkaline Phosphatase: 73 U/L (ref 38–126)
Anion gap: 10 (ref 5–15)
BUN: 26 mg/dL — ABNORMAL HIGH (ref 8–23)
CO2: 20 mmol/L — ABNORMAL LOW (ref 22–32)
Calcium: 9.3 mg/dL (ref 8.9–10.3)
Chloride: 106 mmol/L (ref 98–111)
Creatinine, Ser: 0.99 mg/dL (ref 0.61–1.24)
GFR calc Af Amer: >60 mL/min (ref >60)
GFR calc non Af Amer: >60 mL/min (ref >60)
Glucose, Bld: 332 mg/dL — ABNORMAL HIGH (ref 70–99)
Potassium: 4.1 mmol/L (ref 3.5–5.1)
Sodium: 136 mmol/L (ref 135–145)
Total Bilirubin: 1.2 mg/dL (ref 0.3–1.2)
Total Protein: 7.7 g/dL (ref 6.5–8.1)

## 2019-10-17 LAB — MAGNESIUM: Magnesium: 2.2 mg/dL (ref 1.7–2.4)

## 2019-10-17 MED ORDER — CARVEDILOL 12.5 MG PO TABS: 6.2500 mg | ORAL_TABLET | Freq: Two times a day (BID) | ORAL | 0 refills | Status: DC

## 2019-10-17 MED ORDER — ACETAMINOPHEN 500 MG PO TABS
1000.0000 mg | ORAL_TABLET | Freq: Once | ORAL | Status: AC
  Administered 2019-10-17: 1000 mg via ORAL
  Filled 2019-10-17: qty 2

## 2019-10-17 MED ORDER — CYCLOBENZAPRINE HCL 5 MG PO TABS: 5.0000 mg | ORAL_TABLET | Freq: Three times a day (TID) | ORAL | 0 refills | Status: AC | PRN

## 2019-10-17 MED ORDER — LIDOCAINE 5 % EX PTCH
1.0000 | MEDICATED_PATCH | CUTANEOUS | Status: DC
  Administered 2019-10-17: 1 via TRANSDERMAL
  Filled 2019-10-17: qty 1
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
## 2019-10-17 NOTE — ED Notes (Signed)
Denies chest pain. Endorses some SOB from his already known CHF. Seen this morning at the CHF clinic and sent home. NAD at this time.

## 2019-10-17 NOTE — Patient Instructions (Signed)
Continue weighing daily and call for an overnight weight gain of > 2 pounds or a weekly weight gain of >5 pounds. 
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
## 2019-10-17 NOTE — Discharge Instructions (Addendum)
No acute new fractures.  CT head was negative.  Take Tylenol 1 g every 8 hours.  Take the Flexeril for muscle spasms.  Do not drive while on this.  Be careful because it can cause some dizziness I do not want you to have another fall.  To note your heart rate was low but you are having no symptoms.  I discussed with cardiology who wants you to decrease your carvedilol or Coreg.  You should take 6.25 mg twice daily.  This is a half of your normal pill.  You should follow-up with cardiology in 1 week for repeat heart rate check.  If you develop symptoms such as dizziness, loss of consciousness you should return to the ER follow-up with cardiology immediately   1. No evidence of acute intracranial abnormality. 2. Stable generalized parenchymal atrophy and chronic small vessel ischemic disease. 3. Paranasal sinus disease as described and greatest within the left sphenoid and maxillary sinuses.  CT cervical spine:  1. No evidence of acute fracture to the cervical spine. 2. Mild age-indeterminate T1 vertebra superior endplate deformity. 3. Cervical spondylosis greatest at C4-C5, C5-C6 and C6-C7.  IMPRESSION: 1. No evidence of acute thoracic or lumbar spine fracture, traumatic subluxation or static signs of instability. 2. Postsurgical changes related to previous posterior decompression at L2-3. Mild multifactorial spinal stenosis at L3-4 and L4-5. 3. Severe osseous foraminal narrowing on the left at L5-S1. 4. Aortic Atherosclerosis (ICD10-I70.0).

## 2019-10-17 NOTE — ED Notes (Signed)
Pt transported to CT ?

## 2019-10-17 NOTE — ED Notes (Signed)
Report received from Jessica, RN.
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
## 2019-11-14 ENCOUNTER — Other Ambulatory Visit: Payer: Self-pay

## 2019-11-14 ENCOUNTER — Encounter: Payer: Self-pay | Admitting: Nurse Practitioner

## 2019-11-14 ENCOUNTER — Ambulatory Visit (INDEPENDENT_AMBULATORY_CARE_PROVIDER_SITE_OTHER): Payer: Self-pay | Admitting: Nurse Practitioner

## 2019-11-14 VITALS — BP 158/90 | HR 50 | Ht 66.0 in | Wt 218.4 lb

## 2019-11-14 DIAGNOSIS — I5022 Chronic systolic (congestive) heart failure: Secondary | ICD-10-CM

## 2019-11-14 DIAGNOSIS — I493 Ventricular premature depolarization: Secondary | ICD-10-CM

## 2019-11-14 DIAGNOSIS — I639 Cerebral infarction, unspecified: Secondary | ICD-10-CM

## 2019-11-14 DIAGNOSIS — I255 Ischemic cardiomyopathy: Secondary | ICD-10-CM

## 2019-11-14 DIAGNOSIS — I2510 Atherosclerotic heart disease of native coronary artery without angina pectoris: Secondary | ICD-10-CM

## 2019-11-14 DIAGNOSIS — E785 Hyperlipidemia, unspecified: Secondary | ICD-10-CM

## 2019-11-14 DIAGNOSIS — I10 Essential (primary) hypertension: Secondary | ICD-10-CM

## 2019-11-14 MED ORDER — FUROSEMIDE 40 MG PO TABS: 40.0000 mg | ORAL_TABLET | Freq: Every day | ORAL | 0 refills | Status: DC | PRN
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
## 2019-11-14 NOTE — Patient Instructions (Signed)
Medication Instructions:  1- Med list has been updated. Please take medications as indicated on your list. *If you need a refill on your cardiac medications before your next appointment, please call your pharmacy*   Lab Work: None ordered  If you have labs (blood work) drawn today and your tests are completely normal, you will receive your results only by: [name] MyChart Message (if you have MyChart) OR . A paper copy in the mail If you have any lab test that is abnormal or we need to change your treatment, we will call you to review the results.   Testing/Procedures: None ordered    Follow-Up: At [facility], you and your health needs are our priority.  As part of our continuing mission to provide you with exceptional heart care, we have created designated Provider Care Teams.  These Care Teams include your primary Cardiologist (physician) and Advanced Practice Providers (APPs -  Physician Assistants and Nurse Practitioners) who all work together to provide you with the care you need, when you need it.  We recommend signing up for the patient portal called "MyChart".  Sign up information is provided on this After Visit Summary.  MyChart is used to connect with patients for Virtual Visits (Telemedicine).  Patients are able to view lab/test results, encounter notes, upcoming appointments, etc.  Non-urgent messages can be sent to your provider as well.   To learn more about what you can do with MyChart, go to [email or web address].    Your next appointment:   2 month(s)  The format for your next appointment:   In Person  Provider:    You may see Lorine Bears, MD or Nicolasa Ducking, NP

## 2019-11-15 ENCOUNTER — Ambulatory Visit: Payer: Self-pay | Admitting: Nurse Practitioner

## 2020-01-15 ENCOUNTER — Ambulatory Visit: Payer: Self-pay | Admitting: Family

## 2020-01-17 ENCOUNTER — Ambulatory Visit: Payer: Self-pay | Admitting: Nurse Practitioner

## 2020-01-21 ENCOUNTER — Encounter: Payer: Self-pay | Admitting: Nurse Practitioner

## 2020-04-21 ENCOUNTER — Other Ambulatory Visit: Payer: Self-pay

## 2020-04-21 ENCOUNTER — Encounter: Payer: Self-pay | Admitting: Emergency Medicine

## 2020-04-21 ENCOUNTER — Emergency Department: Payer: Self-pay

## 2020-04-21 DIAGNOSIS — Z20822 Contact with and (suspected) exposure to covid-19: Secondary | ICD-10-CM | POA: Diagnosis present

## 2020-04-21 DIAGNOSIS — Z87891 Personal history of nicotine dependence: Secondary | ICD-10-CM

## 2020-04-21 DIAGNOSIS — E1165 Type 2 diabetes mellitus with hyperglycemia: Secondary | ICD-10-CM | POA: Diagnosis present

## 2020-04-21 DIAGNOSIS — E785 Hyperlipidemia, unspecified: Secondary | ICD-10-CM | POA: Diagnosis present

## 2020-04-21 DIAGNOSIS — I255 Ischemic cardiomyopathy: Secondary | ICD-10-CM | POA: Diagnosis present

## 2020-04-21 DIAGNOSIS — Z8673 Personal history of transient ischemic attack (TIA), and cerebral infarction without residual deficits: Secondary | ICD-10-CM

## 2020-04-21 DIAGNOSIS — Z955 Presence of coronary angioplasty implant and graft: Secondary | ICD-10-CM

## 2020-04-21 DIAGNOSIS — I2510 Atherosclerotic heart disease of native coronary artery without angina pectoris: Secondary | ICD-10-CM | POA: Diagnosis present

## 2020-04-21 DIAGNOSIS — I472 Ventricular tachycardia: Secondary | ICD-10-CM | POA: Diagnosis present

## 2020-04-21 DIAGNOSIS — Z23 Encounter for immunization: Secondary | ICD-10-CM

## 2020-04-21 DIAGNOSIS — Z7902 Long term (current) use of antithrombotics/antiplatelets: Secondary | ICD-10-CM

## 2020-04-21 DIAGNOSIS — I5042 Chronic combined systolic (congestive) and diastolic (congestive) heart failure: Secondary | ICD-10-CM | POA: Diagnosis present

## 2020-04-21 DIAGNOSIS — Z7982 Long term (current) use of aspirin: Secondary | ICD-10-CM

## 2020-04-21 DIAGNOSIS — I252 Old myocardial infarction: Secondary | ICD-10-CM

## 2020-04-21 DIAGNOSIS — Z9114 Patient's other noncompliance with medication regimen: Secondary | ICD-10-CM

## 2020-04-21 DIAGNOSIS — I214 Non-ST elevation (NSTEMI) myocardial infarction: Principal | ICD-10-CM | POA: Diagnosis present

## 2020-04-21 DIAGNOSIS — Z79899 Other long term (current) drug therapy: Secondary | ICD-10-CM

## 2020-04-21 DIAGNOSIS — E876 Hypokalemia: Secondary | ICD-10-CM | POA: Diagnosis present

## 2020-04-21 DIAGNOSIS — I110 Hypertensive heart disease with heart failure: Secondary | ICD-10-CM | POA: Diagnosis present

## 2020-04-21 DIAGNOSIS — I493 Ventricular premature depolarization: Secondary | ICD-10-CM | POA: Diagnosis present

## 2020-04-21 LAB — TROPONIN I (HIGH SENSITIVITY): Troponin I (High Sensitivity): 630 ng/L (ref <18)

## 2020-04-21 LAB — COMPREHENSIVE METABOLIC PANEL
ALT: 14 U/L (ref 0–44)
AST: 25 U/L (ref 15–41)
Albumin: 4 g/dL (ref 3.5–5.0)
Alkaline Phosphatase: 70 U/L (ref 38–126)
Anion gap: 12 (ref 5–15)
BUN: 13 mg/dL (ref 8–23)
CO2: 19 mmol/L — ABNORMAL LOW (ref 22–32)
Calcium: 8.5 mg/dL — ABNORMAL LOW (ref 8.9–10.3)
Chloride: 107 mmol/L (ref 98–111)
Creatinine, Ser: 0.87 mg/dL (ref 0.61–1.24)
GFR, Estimated: >60 mL/min (ref >60)
Glucose, Bld: 262 mg/dL — ABNORMAL HIGH (ref 70–99)
Potassium: 3.2 mmol/L — ABNORMAL LOW (ref 3.5–5.1)
Sodium: 138 mmol/L (ref 135–145)
Total Bilirubin: 1.1 mg/dL (ref 0.3–1.2)
Total Protein: 7.1 g/dL (ref 6.5–8.1)

## 2020-04-21 LAB — CBC WITH DIFFERENTIAL/PLATELET
Abs Immature Granulocytes: 0.04 10*3/uL (ref 0.00–0.07)
Basophils Absolute: 0.1 10*3/uL (ref 0.0–0.1)
Basophils Relative: 1 %
Eosinophils Absolute: 0.2 10*3/uL (ref 0.0–0.5)
Eosinophils Relative: 2 %
HCT: 43.6 % (ref 39.0–52.0)
Hemoglobin: 15.8 g/dL (ref 13.0–17.0)
Immature Granulocytes: 0 %
Lymphocytes Relative: 25 %
Lymphs Abs: 2.4 10*3/uL (ref 0.7–4.0)
MCH: 30.8 pg (ref 26.0–34.0)
MCHC: 36.2 g/dL — ABNORMAL HIGH (ref 30.0–36.0)
MCV: 85 fL (ref 80.0–100.0)
Monocytes Absolute: 0.9 10*3/uL (ref 0.1–1.0)
Monocytes Relative: 10 %
Neutro Abs: 5.8 10*3/uL (ref 1.7–7.7)
Neutrophils Relative %: 62 %
Platelets: 244 10*3/uL (ref 150–400)
RBC: 5.13 MIL/uL (ref 4.22–5.81)
RDW: 14.4 % (ref 11.5–15.5)
WBC: 9.3 10*3/uL (ref 4.0–10.5)
nRBC: 0 % (ref 0.0–0.2)

## 2020-04-21 LAB — LIPASE, BLOOD: Lipase: 31 U/L (ref 11–51)

## 2020-04-21 NOTE — ED Triage Notes (Signed)
Pt to triage via w/c with no distress noted; reports mid upper CP radiating into left arm and throat x 3 days

## 2020-04-22 ENCOUNTER — Inpatient Hospital Stay (HOSPITAL_COMMUNITY)
Admit: 2020-04-22 | Discharge: 2020-04-22 | Disposition: A | Discharge status: Home / Self Care | Payer: Self-pay | Attending: Physician Assistant | Admitting: Physician Assistant

## 2020-04-22 ENCOUNTER — Inpatient Hospital Stay
Admission: EM | Admit: 2020-04-22 | Discharge: 2020-04-24 | DRG: 281 | Disposition: A | Discharge status: Home / Self Care | Payer: Self-pay | Attending: Internal Medicine | Admitting: Internal Medicine

## 2020-04-22 DIAGNOSIS — E785 Hyperlipidemia, unspecified: Secondary | ICD-10-CM

## 2020-04-22 DIAGNOSIS — I214 Non-ST elevation (NSTEMI) myocardial infarction: Secondary | ICD-10-CM | POA: Diagnosis present

## 2020-04-22 DIAGNOSIS — I10 Essential (primary) hypertension: Secondary | ICD-10-CM

## 2020-04-22 DIAGNOSIS — I493 Ventricular premature depolarization: Secondary | ICD-10-CM

## 2020-04-22 DIAGNOSIS — I509 Heart failure, unspecified: Secondary | ICD-10-CM

## 2020-04-22 DIAGNOSIS — I5022 Chronic systolic (congestive) heart failure: Secondary | ICD-10-CM

## 2020-04-22 DIAGNOSIS — I2510 Atherosclerotic heart disease of native coronary artery without angina pectoris: Secondary | ICD-10-CM | POA: Diagnosis present

## 2020-04-22 DIAGNOSIS — E876 Hypokalemia: Secondary | ICD-10-CM

## 2020-04-22 DIAGNOSIS — E1165 Type 2 diabetes mellitus with hyperglycemia: Secondary | ICD-10-CM | POA: Diagnosis present

## 2020-04-22 DIAGNOSIS — I200 Unstable angina: Secondary | ICD-10-CM

## 2020-04-22 LAB — PROTIME-INR
INR: 1 (ref 0.8–1.2)
Prothrombin Time: 12.9 seconds (ref 11.4–15.2)

## 2020-04-22 LAB — CBC
HCT: 44.9 % (ref 39.0–52.0)
Hemoglobin: 15.9 g/dL (ref 13.0–17.0)
MCH: 30.5 pg (ref 26.0–34.0)
MCHC: 35.4 g/dL (ref 30.0–36.0)
MCV: 86.2 fL (ref 80.0–100.0)
Platelets: 237 10*3/uL (ref 150–400)
RBC: 5.21 MIL/uL (ref 4.22–5.81)
RDW: 14.6 % (ref 11.5–15.5)
WBC: 9.1 10*3/uL (ref 4.0–10.5)
nRBC: 0 % (ref 0.0–0.2)

## 2020-04-22 LAB — ECHOCARDIOGRAM COMPLETE
AR max vel: 3.12 cm2
AV Area VTI: 3.32 cm2
AV Area mean vel: 3.39 cm2
AV Mean grad: 2 mmHg
AV Peak grad: 5 mmHg
Ao pk vel: 1.12 m/s
Area-P 1/2: 3.27 cm2
S' Lateral: 5.02 cm

## 2020-04-22 LAB — TROPONIN I (HIGH SENSITIVITY)
Troponin I (High Sensitivity): 1362 ng/L (ref <18)
Troponin I (High Sensitivity): 1606 ng/L (ref <18)
Troponin I (High Sensitivity): 1609 ng/L (ref <18)
Troponin I (High Sensitivity): 599 ng/L (ref <18)

## 2020-04-22 LAB — BASIC METABOLIC PANEL
Anion gap: 11 (ref 5–15)
BUN: 11 mg/dL (ref 8–23)
CO2: 21 mmol/L — ABNORMAL LOW (ref 22–32)
Calcium: 8.6 mg/dL — ABNORMAL LOW (ref 8.9–10.3)
Chloride: 108 mmol/L (ref 98–111)
Creatinine, Ser: 0.78 mg/dL (ref 0.61–1.24)
GFR, Estimated: >60 mL/min (ref >60)
Glucose, Bld: 143 mg/dL — ABNORMAL HIGH (ref 70–99)
Potassium: 3.7 mmol/L (ref 3.5–5.1)
Sodium: 140 mmol/L (ref 135–145)

## 2020-04-22 LAB — RESPIRATORY PANEL BY RT PCR (FLU A&B, COVID)
Influenza A by PCR: NEGATIVE
Influenza B by PCR: NEGATIVE
SARS Coronavirus 2 by RT PCR: NEGATIVE

## 2020-04-22 LAB — MAGNESIUM: Magnesium: 2 mg/dL (ref 1.7–2.4)

## 2020-04-22 LAB — HEPARIN LEVEL (UNFRACTIONATED)
Heparin Unfractionated: 0.18 IU/mL — ABNORMAL LOW (ref 0.30–0.70)
Heparin Unfractionated: 0.3 IU/mL (ref 0.30–0.70)

## 2020-04-22 LAB — APTT: aPTT: 29 seconds (ref 24–36)

## 2020-04-22 MED ORDER — POTASSIUM CHLORIDE CRYS ER 20 MEQ PO TBCR
40.0000 meq | EXTENDED_RELEASE_TABLET | Freq: Once | ORAL | Status: AC
  Administered 2020-04-22: 40 meq via ORAL
  Filled 2020-04-22: qty 2

## 2020-04-22 MED ORDER — ASPIRIN 81 MG PO CHEW
324.0000 mg | CHEWABLE_TABLET | Freq: Once | ORAL | Status: AC
  Administered 2020-04-22: 324 mg via ORAL
  Filled 2020-04-22: qty 4

## 2020-04-22 MED ORDER — ACETAMINOPHEN 325 MG PO TABS
650.0000 mg | ORAL_TABLET | Freq: Once | ORAL | Status: AC
  Administered 2020-04-22: 650 mg via ORAL
  Filled 2020-04-22: qty 2

## 2020-04-22 MED ORDER — SACUBITRIL-VALSARTAN 49-51 MG PO TABS
1.0000 | ORAL_TABLET | Freq: Two times a day (BID) | ORAL | Status: DC
  Administered 2020-04-22: 1 via ORAL
  Filled 2020-04-22 (×2): qty 1

## 2020-04-22 MED ORDER — HEPARIN (PORCINE) 25000 UT/250ML-% IV SOLN
1800.0000 [IU]/h | INTRAVENOUS | Status: DC
  Administered 2020-04-22: 1200 [IU]/h via INTRAVENOUS
  Administered 2020-04-23 (×2): 1600 [IU]/h via INTRAVENOUS
  Filled 2020-04-22 (×4): qty 250

## 2020-04-22 MED ORDER — LORATADINE 10 MG PO TABS
10.0000 mg | ORAL_TABLET | Freq: Every day | ORAL | Status: DC
  Administered 2020-04-22 – 2020-04-24 (×3): 10 mg via ORAL
  Filled 2020-04-22 (×3): qty 1

## 2020-04-22 MED ORDER — EZETIMIBE 10 MG PO TABS
10.0000 mg | ORAL_TABLET | Freq: Every day | ORAL | Status: DC
  Administered 2020-04-22 – 2020-04-24 (×3): 10 mg via ORAL
  Filled 2020-04-22 (×4): qty 1

## 2020-04-22 MED ORDER — CARVEDILOL 3.125 MG PO TABS
3.1250 mg | ORAL_TABLET | Freq: Two times a day (BID) | ORAL | Status: DC
  Administered 2020-04-23 – 2020-04-24 (×4): 3.125 mg via ORAL
  Filled 2020-04-22 (×7): qty 1

## 2020-04-22 MED ORDER — HEPARIN SODIUM (PORCINE) 5000 UNIT/ML IJ SOLN
4000.0000 [IU] | Freq: Once | INTRAMUSCULAR | Status: DC
  Filled 2020-04-22: qty 1

## 2020-04-22 MED ORDER — ISOSORBIDE MONONITRATE ER 30 MG PO TB24
15.0000 mg | ORAL_TABLET | Freq: Every day | ORAL | Status: DC
  Administered 2020-04-23 – 2020-04-24 (×2): 15 mg via ORAL
  Filled 2020-04-22 (×4): qty 1

## 2020-04-22 MED ORDER — ONDANSETRON HCL 4 MG/2ML IJ SOLN: 4.0000 mg | Freq: Four times a day (QID) | INTRAMUSCULAR | Status: DC | PRN

## 2020-04-22 MED ORDER — CLOPIDOGREL BISULFATE 75 MG PO TABS
75.0000 mg | ORAL_TABLET | Freq: Every day | ORAL | Status: DC
  Administered 2020-04-23 – 2020-04-24 (×2): 75 mg via ORAL
  Filled 2020-04-22 (×2): qty 1

## 2020-04-22 MED ORDER — ACETAMINOPHEN 325 MG PO TABS
650.0000 mg | ORAL_TABLET | ORAL | Status: DC | PRN
  Administered 2020-04-22 – 2020-04-23 (×2): 650 mg via ORAL
  Filled 2020-04-22 (×2): qty 2

## 2020-04-22 MED ORDER — NITROGLYCERIN 2 % TD OINT
1.0000 [in_us] | TOPICAL_OINTMENT | Freq: Once | TRANSDERMAL | Status: AC
  Administered 2020-04-22: 1 [in_us] via TOPICAL
  Filled 2020-04-22: qty 1

## 2020-04-22 MED ORDER — INFLUENZA VAC SPLIT QUAD 0.5 ML IM SUSY: 0.5000 mL | PREFILLED_SYRINGE | INTRAMUSCULAR | Status: DC

## 2020-04-22 MED ORDER — CLOPIDOGREL BISULFATE 75 MG PO TABS
300.0000 mg | ORAL_TABLET | Freq: Once | ORAL | Status: AC
  Administered 2020-04-22: 300 mg via ORAL
  Filled 2020-04-22: qty 4

## 2020-04-22 MED ORDER — FUROSEMIDE 40 MG PO TABS
40.0000 mg | ORAL_TABLET | Freq: Every day | ORAL | Status: DC
  Administered 2020-04-22 – 2020-04-24 (×3): 40 mg via ORAL
  Filled 2020-04-22 (×3): qty 1

## 2020-04-22 MED ORDER — ASPIRIN EC 81 MG PO TBEC
81.0000 mg | DELAYED_RELEASE_TABLET | Freq: Every day | ORAL | Status: DC
  Administered 2020-04-23 – 2020-04-24 (×2): 81 mg via ORAL
  Filled 2020-04-22 (×2): qty 1

## 2020-04-22 MED ORDER — NITROGLYCERIN 0.4 MG SL SUBL
0.4000 mg | SUBLINGUAL_TABLET | SUBLINGUAL | Status: DC | PRN
  Filled 2020-04-22: qty 1

## 2020-04-22 MED ORDER — HEPARIN BOLUS VIA INFUSION
4000.0000 [IU] | Freq: Once | INTRAVENOUS | Status: AC
  Administered 2020-04-22: 4000 [IU] via INTRAVENOUS
  Filled 2020-04-22: qty 4000

## 2020-04-22 MED ORDER — HEPARIN BOLUS VIA INFUSION
2500.0000 [IU] | Freq: Once | INTRAVENOUS | Status: AC
  Administered 2020-04-22: 2500 [IU] via INTRAVENOUS
  Filled 2020-04-22: qty 2500

## 2020-04-22 MED ORDER — HEPARIN (PORCINE) 25000 UT/250ML-% IV SOLN: 14.0000 [IU]/kg/h | INTRAVENOUS | Status: DC

## 2020-04-22 MED ORDER — PERFLUTREN LIPID MICROSPHERE
1.0000 mL | INTRAVENOUS | Status: AC | PRN
  Administered 2020-04-22: 2 mL via INTRAVENOUS
  Filled 2020-04-22: qty 10

## 2020-04-22 NOTE — ED Notes (Signed)
Lab at bedside to obtain heparin level.

## 2020-04-22 NOTE — Progress Notes (Signed)
Notified Dr. Okey Dupre of patient's bradycardia this shift.

## 2020-04-22 NOTE — Progress Notes (Signed)
ANTICOAGULATION CONSULT NOTE   Pharmacy Consult for Heparin  Indication: chest pain/ACS  Allergies  Allergen Reactions  . Contrast Media [Iodinated Diagnostic Agents] Shortness Of Breath  . Iohexol Shortness Of Breath     Onset Date: [id number]   . Atorvastatin Other (See Comments)    Myalgias   . Hydrocodone Itching  . Morphine And Related Other (See Comments)    Lost control   . Penicillins Other (See Comments)    Unknown reaction Did it involve swelling of the face/tongue/throat, SOB, or low BP? Unknown Did it involve sudden or severe rash/hives, skin peeling, or any reaction on the inside of your mouth or nose? Unknown Did you need to seek medical attention at a hospital or doctor's office? Yes When did it last happen?Childhood allergy  If all above answers are "NO", may proceed with cephalosporin use.   . Rosuvastatin Other (See Comments)    Myalgias     Patient Measurements: Height: 5\' 6"  (167.6 cm) Weight: 111.1 kg (245 lb) IBW/kg (Calculated) : 63.8 Heparin Dosing Weight: 89.2 kg   Vital Signs: BP: 140/98 (10/19 1830) Pulse Rate: 76 (10/19 1930)  Labs: Recent Labs    04/21/20 2301 04/21/20 2301 04/22/20 0058 04/22/20 0643 04/22/20 1524 04/22/20 1912  HGB 15.8  --   --   --   --  15.9  HCT 43.6  --   --   --   --  44.9  PLT 244  --   --   --   --  237  APTT  --   --  29  --   --   --   LABPROT  --   --  12.9  --   --   --   INR  --   --  1.0  --   --   --   HEPARINUNFRC  --   --   --  0.18*  --  0.30  CREATININE 0.87  --   --  0.78  --   --   TROPONINIHS 630*   < > 599* 1,609* 1,606*  --    < > = values in this interval not displayed.    Estimated Creatinine Clearance: 110.6 mL/min (by C-G formula based on SCr of 0.78 mg/dL).   Medical History: Past Medical History:  Diagnosis Date  . Anginal pain ([facility])   . Arthritis   . CAD (coronary artery disease)    a. s/p MI with LAD and Diag stenting @ [name];  b. 06/2015 Cath: LAD 25m/d ISR, 100  RCA (ISR) w/ L->R collats, otw mod nonobs dzs-->Med Rx; c. 08/2019 Cath: LM nl, LAD 10p/m ISR, D1 20, D2 100, RI min irregs, LCX 55m/d, OM1 100, OM2 50, RCA 100p, 70d. RPDA fills via collats from LAD. EF 25-35%-->Med Rx; d. 10/2019 NSTEMI/Cath: LCX now 100 (2.75x15 Resolute Onyx DES), otw stable compared to 08/2019.  09/2019 Chronic combined systolic and diastolic CHF (congestive heart failure) ([facility])    a. 06/2015 Echo: EF 20-25%, Gr3 DD; b. 10/2019 Echo: EF 25-30%, glob HK, sev inf/infapical HK. Mod dil LA.  12-13-1985 Dyspnea   . Essential hypertension   . Hyperlipidemia   . Hypokalemia    a. 06/2015 in setting of diuresis.  . Ischemic cardiomyopathy    a. 2011 EF 45% ([name]);  b. 06/2015 Echo: EF 20-25%; c. 04/2019 Echo: EF 40-45%; d. 08/2019 LV gram: EF 25-35%; e. 10/2019 Echo: EF 25-30%.  . PVC's (premature ventricular contractions)    a.  09/2019 Zio (3 days): Avg hr 70, 4 runs NSVT, 5 runs SVT, rare PACs, frequent PVCs w/ 11.8% burden.  . Sleep apnea   . Stroke/Right temporal lobe infarction [facility])    a. 10/2019 MRI brain: 1cm acute ischemic nonhemorrhagic R temporal lobe infarct. Age-related cerebral atrophy w/ moderate chronic small vessel ischemic dzs.  . Type 2 diabetes mellitus with hyperglycemia ([facility]) 10/08/2019    Medications:  (Not in a hospital admission)   Assessment: Pharmacy consulted to dose heparin in this 64 year old male admitted with ACS/NSTEMI.  No prior anticoag noted.  CrCl = 101.7 ml/min   10/19 Heparin infusion started @ 1200 units/hr  10/19 @ 0643 HL 0.18 10/19 @ 1912 HL 0.3  Goal of Therapy:  Heparin level 0.3-0.7 units/ml Monitor platelets by anticoagulation protocol: Yes   Plan:  Heparin level is therapeutic but on the lower end of goal. Will increase heparin infusion to 1600 units/hr. Recheck heparin level in 6 hours. CBC daily while on heparin.   Ronnald Ramp, PharmD, BCPS Clinical Pharmacist 04/22/2020 8:01 PM

## 2020-04-22 NOTE — Progress Notes (Signed)
*  PRELIMINARY RESULTS* Echocardiogram 2D Echocardiogram has been performed.  [name] 04/22/2020, 10:00 AM

## 2020-04-22 NOTE — Progress Notes (Signed)
Pt transferred to 2A. Has refused heart monitor placement. Will try again once finished in bathroom.

## 2020-04-22 NOTE — Progress Notes (Signed)
ANTICOAGULATION CONSULT NOTE - Initial Consult  Pharmacy Consult for Heparin  Indication: chest pain/ACS  Allergies  Allergen Reactions  . Contrast Media [Iodinated Diagnostic Agents] Shortness Of Breath  . Iohexol Shortness Of Breath     Onset Date: [id number]   . Atorvastatin Other (See Comments)    Myalgias   . Hydrocodone Itching  . Morphine And Related Other (See Comments)    Lost control   . Penicillins Other (See Comments)    Unknown reaction Did it involve swelling of the face/tongue/throat, SOB, or low BP? Unknown Did it involve sudden or severe rash/hives, skin peeling, or any reaction on the inside of your mouth or nose? Unknown Did you need to seek medical attention at a hospital or doctor's office? Yes When did it last happen?Childhood allergy  If all above answers are "NO", may proceed with cephalosporin use.   . Rosuvastatin Other (See Comments)    Myalgias     Patient Measurements: Height: 5\' 6"  (167.6 cm) Weight: 111.1 kg (245 lb) IBW/kg (Calculated) : 63.8 Heparin Dosing Weight: 89.2 kg   Vital Signs: Temp: 98 F (36.7 C) (10/19 0448) Temp Source: Oral (10/19 0448) BP: 111/82 (10/19 0755) Pulse Rate: 50 (10/19 0800)  Labs: Recent Labs    04/21/20 2301 04/22/20 0058 04/22/20 0643  HGB 15.8  --   --   HCT 43.6  --   --   PLT 244  --   --   APTT  --  29  --   LABPROT  --  12.9  --   INR  --  1.0  --   HEPARINUNFRC  --   --  0.18*  CREATININE 0.87  --  0.78  TROPONINIHS 630* 599* 1,609*    Estimated Creatinine Clearance: 110.6 mL/min (by C-G formula based on SCr of 0.78 mg/dL).   Medical History: Past Medical History:  Diagnosis Date  . Anginal pain ([facility])   . Arthritis   . CAD (coronary artery disease)    a. s/p MI with LAD and Diag stenting @ [name];  b. 06/2015 Cath: LAD 37m/d ISR, 100 RCA (ISR) w/ L->R collats, otw mod nonobs dzs-->Med Rx; c. 08/2019 Cath: LM nl, LAD 10p/m ISR, D1 20, D2 100, RI min irregs, LCX 5m/d, OM1 100, OM2  50, RCA 100p, 70d. RPDA fills via collats from LAD. EF 25-35%-->Med Rx; d. 10/2019 NSTEMI/Cath: LCX now 100 (2.75x15 Resolute Onyx DES), otw stable compared to 08/2019.  09/2019 Chronic combined systolic and diastolic CHF (congestive heart failure) ([facility])    a. 06/2015 Echo: EF 20-25%, Gr3 DD; b. 10/2019 Echo: EF 25-30%, glob HK, sev inf/infapical HK. Mod dil LA.  12-13-1985 Dyspnea   . Essential hypertension   . Hyperlipidemia   . Hypokalemia    a. 06/2015 in setting of diuresis.  . Ischemic cardiomyopathy    a. 2011 EF 45% ([name]);  b. 06/2015 Echo: EF 20-25%; c. 04/2019 Echo: EF 40-45%; d. 08/2019 LV gram: EF 25-35%; e. 10/2019 Echo: EF 25-30%.  . PVC's (premature ventricular contractions)    a. 09/2019 Zio (3 days): Avg hr 70, 4 runs NSVT, 5 runs SVT, rare PACs, frequent PVCs w/ 11.8% burden.  . Sleep apnea   . Stroke/Right temporal lobe infarction [facility])    a. 10/2019 MRI brain: 1cm acute ischemic nonhemorrhagic R temporal lobe infarct. Age-related cerebral atrophy w/ moderate chronic small vessel ischemic dzs.  . Type 2 diabetes mellitus with hyperglycemia ([facility]) 10/08/2019    Medications:  (Not in a  hospital admission)   Assessment: Pharmacy consulted to dose heparin in this 64 year old male admitted with ACS/NSTEMI.  No prior anticoag noted.  CrCl = 101.7 ml/min   10/19 Heparin infusion started @ 1200 units/hr  10/19 @ 0643 HL 0.18  Goal of Therapy:  Heparin level 0.3-0.7 units/ml Monitor platelets by anticoagulation protocol: Yes   Plan:  10/19 @ 0643 HL 0.18. HL subtherapeutic.  Will order 2500 unit bolus and increase heparin infusion to 1500 units/hr.  Recheck anti-Xa level in 6 hours and daily while on heparin Continue to monitor H&H and platelets  Gardner Candle, PharmD, BCPS Clinical Pharmacist 04/22/2020 11:26 AM

## 2020-04-22 NOTE — ED Notes (Signed)
Pt refuses to leave cardiac monitors on. Pt has been asked to do so as he is on a heparin gtt and has had a heart attack. Pt has told this RN to leave him alone and not place him on O2 for his c/o shortness of breath. Lab called to draw heparin level as pt complained about failed IV stick x 1 by this RN.

## 2020-04-22 NOTE — H&P (Signed)
History and Physical    [name]. IFO:[id number] DOB: 1956-03-01 DOA: 04/22/2020  PCP: [name], MD   Patient coming from: Home  I have personally briefly reviewed patient's old medical records in [facility]  Chief Complaint: Chest pain  HPI: [name]. is a 64 y.o. male with medical history significant for CAD with history of MI x3, admitted in April 2021 with NSTEMI receiving DES to left circumflex, as well as history of HFrEF, EF 25 to 30% secondary to ischemic cardiomyopathy, HTN and HLD who presents to the emergency room with retrosternal chest pain radiating to the back throat and arm of severe intensity similar to prior heart attacks, associated with nausea and diaphoresis.  He denies shortness of breath, cough fever or chills, lower extremity pain or swelling.  Took nitroglycerin with minimal improvement. ED Course: On arrival, vitals unremarkable, EKG with no acute ST-T wave changes on normal sinus rhythm, troponin VI 130.  Blood work otherwise unremarkable except for elevated blood sugar of 262.  Chest x-ray with no acute disease.  Patient started on a heparin infusion.  Hospitalist consulted for admission.    Review of Systems: As per HPI otherwise all other systems on review of systems negative.    Past Medical History:  Diagnosis Date  . Anginal pain (HCC)   . Arthritis   . CAD (coronary artery disease)    a. s/p MI with LAD and Diag stenting @ [name];  b. 06/2015 Cath: LAD 16m/d ISR, 100 RCA (ISR) w/ L->R collats, otw mod nonobs dzs-->Med Rx; c. 08/2019 Cath: LM nl, LAD 10p/m ISR, D1 20, D2 100, RI min irregs, LCX 87m/d, OM1 100, OM2 50, RCA 100p, 70d. RPDA fills via collats from LAD. EF 25-35%-->Med Rx; d. 10/2019 NSTEMI/Cath: LCX now 100 (2.75x15 Resolute Onyx DES), otw stable compared to 08/2019.  [name] Chronic combined systolic and diastolic CHF (congestive heart failure) (HCC)    a. 06/2015 Echo: EF 20-25%, Gr3 DD; b. 10/2019 Echo: EF 25-30%, glob HK, sev  inf/infapical HK. Mod dil LA.  [name] Dyspnea   . Essential hypertension   . Hyperlipidemia   . Hypokalemia    a. 06/2015 in setting of diuresis.  . Ischemic cardiomyopathy    a. 2011 EF 45% ([name]);  b. 06/2015 Echo: EF 20-25%; c. 04/2019 Echo: EF 40-45%; d. 08/2019 LV gram: EF 25-35%; e. 10/2019 Echo: EF 25-30%.  . PVC's (premature ventricular contractions)    a. 09/2019 Zio (3 days): Avg hr 70, 4 runs NSVT, 5 runs SVT, rare PACs, frequent PVCs w/ 11.8% burden.  . Sleep apnea   . Stroke/Right temporal lobe infarction [facility])    a. 10/2019 MRI brain: 1cm acute ischemic nonhemorrhagic R temporal lobe infarct. Age-related cerebral atrophy w/ moderate chronic small vessel ischemic dzs.  . Type 2 diabetes mellitus with hyperglycemia (HCC) 10/08/2019    Past Surgical History:  Procedure Laterality Date  . BACK SURGERY  04/2019  . CARDIAC CATHETERIZATION N/A 06/10/2015   Procedure: Left Heart Cath;  Surgeon: Iran Ouch, MD;  Location: [facility] INVASIVE CV LAB;  Service: Cardiovascular;  Laterality: N/A;  . CORONARY STENT INTERVENTION N/A 10/08/2019   Procedure: CORONARY STENT INTERVENTION;  Surgeon: Iran Ouch, MD;  Location: [facility] INVASIVE CV LAB;  Service: Cardiovascular;  Laterality: N/A;  . CORONARY STENT PLACEMENT    . LEFT HEART CATH AND CORONARY ANGIOGRAPHY N/A 10/08/2019   Procedure: LEFT HEART CATH AND CORONARY ANGIOGRAPHY poss pci;  Surgeon: Kirke Corin,  Chelsea Aus, MD;  Location: [facility] INVASIVE CV LAB;  Service: Cardiovascular;  Laterality: N/A;  . RIGHT/LEFT HEART CATH AND CORONARY ANGIOGRAPHY N/A 08/20/2019   Procedure: RIGHT/LEFT HEART CATH AND CORONARY ANGIOGRAPHY;  Surgeon: Iran Ouch, MD;  Location: [facility] INVASIVE CV LAB;  Service: Cardiovascular;  Laterality: N/A;     reports that he quit smoking about 8 years ago. He has never used smokeless tobacco. He reports that he does not drink alcohol and does not use drugs.  Allergies  Allergen Reactions  . Contrast Media [Iodinated  Diagnostic Agents] Shortness Of Breath  . Iohexol Shortness Of Breath     Onset Date: [id number]   . Atorvastatin     Myalgias   . Hydrocodone Itching  . Morphine And Related     Lost control   . Penicillins     Unknown reaction Did it involve swelling of the face/tongue/throat, SOB, or low BP? Unknown Did it involve sudden or severe rash/hives, skin peeling, or any reaction on the inside of your mouth or nose? Unknown Did you need to seek medical attention at a hospital or doctor's office? Yes When did it last happen?Childhood allergy  If all above answers are "NO", may proceed with cephalosporin use.   . Rosuvastatin     Myalgias     Family History  Problem Relation Age of Onset  . Coronary artery disease Mother   . Diabetes Mother   . Coronary artery disease Father       Prior to Admission medications   Medication Sig Start Date End Date Taking? Authorizing Provider  acetaminophen (TYLENOL) 500 MG tablet Take 1,000 mg by mouth at bedtime as needed. At bedtime and as needed.    [provider]  aspirin 81 MG chewable tablet Chew 1 tablet (81 mg total) by mouth daily. Patient not taking: Reported on 11/14/2019 06/11/15   Auburn Bilberry, MD  clopidogrel (PLAVIX) 75 MG tablet Take 1 tablet (75 mg total) by mouth daily. Patient not taking: Reported on 11/14/2019 10/12/19   Pennie Banter, DO  Clotrimazole [facility] RELIEF EX) Apply 1 application topically daily as needed (jock itch).    [provider]  colchicine 0.6 MG tablet Take 1 tablet (0.6 mg total) by mouth daily as needed (gout). Patient not taking: Reported on 11/14/2019 09/13/19   Creig Hines, NP  ezetimibe (ZETIA) 10 MG tablet Take 1 tablet (10 mg total) by mouth daily. Patient not taking: Reported on 11/14/2019 09/13/19 12/12/19  Creig Hines, NP  furosemide (LASIX) 40 MG tablet Take 1 tablet (40 mg total) by mouth daily as needed. 11/14/19   Creig Hines, NP   lip balm Karlton Lemon) OINT Apply 1 application topically as needed for lip care.    [provider]  nitroGLYCERIN (NITROSTAT) 0.4 MG SL tablet Place 1 tablet (0.4 mg total) under the tongue every 5 (five) minutes as needed for chest pain. Patient not taking: Reported on 11/14/2019 10/11/19   Esaw Grandchild A, DO  sacubitril-valsartan (ENTRESTO) 49-51 MG Take 1 tablet by mouth 2 (two) times daily.    [provider]    Physical Exam: Vitals:   04/21/20 2257 04/21/20 2301  BP:  138/90  Pulse:  76  Resp:  20  Temp:  97.7 F (36.5 C)  TempSrc:  Oral  SpO2:  93%  Weight: 111.1 kg   Height: 5\' 6"  (1.676 m)      Vitals:   04/21/20 2257 04/21/20 2301  BP:  138/90  Pulse:  76  Resp:  20  Temp:  97.7 F (36.5 C)  TempSrc:  Oral  SpO2:  93%  Weight: 111.1 kg   Height: 5\' 6"  (1.676 m)       Constitutional: Alert and oriented x 3 . Not in any apparent distress HEENT:      Head: Normocephalic and atraumatic.         Eyes: PERLA, EOMI, Conjunctivae are normal. Sclera is non-icteric.       Mouth/Throat: Mucous membranes are moist.       Neck: Supple with no signs of meningismus. Cardiovascular: Regular rate and rhythm. No murmurs, gallops, or rubs. 2+ symmetrical distal pulses are present . No JVD. No LE edema Respiratory: Respiratory effort normal .Lungs sounds clear bilaterally. No wheezes, crackles, or rhonchi.  Gastrointestinal: Soft, non tender, and non distended with positive bowel sounds. No rebound or guarding. Genitourinary: No CVA tenderness. Musculoskeletal: Nontender with normal range of motion in all extremities. No cyanosis, or erythema of extremities. Neurologic:  Face is symmetric. Moving all extremities. No gross focal neurologic deficits . Skin: Skin is warm, dry.  No rash or ulcers Psychiatric: Mood and affect are normal    Labs on Admission: I have personally reviewed following labs and imaging studies  CBC: Recent Labs  Lab 04/21/20 2301    WBC 9.3  NEUTROABS 5.8  HGB 15.8  HCT 43.6  MCV 85.0  PLT 244   Basic Metabolic Panel: Recent Labs  Lab 04/21/20 2301  NA 138  K 3.2*  CL 107  CO2 19*  GLUCOSE 262*  BUN 13  CREATININE 0.87  CALCIUM 8.5*   GFR: Estimated Creatinine Clearance: 101.7 mL/min (by C-G formula based on SCr of 0.87 mg/dL). Liver Function Tests: Recent Labs  Lab 04/21/20 2301  AST 25  ALT 14  ALKPHOS 70  BILITOT 1.1  PROT 7.1  ALBUMIN 4.0   Recent Labs  Lab 04/21/20 1958  LIPASE 31   No results for input(s): AMMONIA in the last 168 hours. Coagulation Profile: No results for input(s): INR, PROTIME in the last 168 hours. Cardiac Enzymes: No results for input(s): CKTOTAL, CKMB, CKMBINDEX, TROPONINI in the last 168 hours. BNP (last 3 results) No results for input(s): PROBNP in the last 8760 hours. HbA1C: No results for input(s): HGBA1C in the last 72 hours. CBG: No results for input(s): GLUCAP in the last 168 hours. Lipid Profile: No results for input(s): CHOL, HDL, LDLCALC, TRIG, CHOLHDL, LDLDIRECT in the last 72 hours. Thyroid Function Tests: No results for input(s): TSH, T4TOTAL, FREET4, T3FREE, THYROIDAB in the last 72 hours. Anemia Panel: No results for input(s): VITAMINB12, FOLATE, FERRITIN, TIBC, IRON, RETICCTPCT in the last 72 hours. Urine analysis: No results found for: COLORURINE, APPEARANCEUR, LABSPEC, PHURINE, GLUCOSEU, HGBUR, BILIRUBINUR, KETONESUR, PROTEINUR, UROBILINOGEN, NITRITE, LEUKOCYTESUR  Radiological Exams on Admission: DG Chest 2 View  Result Date: 04/21/2020 CLINICAL DATA:  Chest pain radiating into the left arm EXAM: CHEST - 2 VIEW COMPARISON:  10/06/2019 FINDINGS: Cardiac shadow is within normal limits. Lungs are well aerated bilaterally. No focal infiltrate or sizable effusion is seen. Coronary stents are noted. No bony abnormality is seen. IMPRESSION: No acute abnormality noted. Electronically Signed   By: Alcide Clever [name]   On: 04/21/2020 23:28      Assessment/Plan 64 year old male with history of CAD with history of MI x3, admitted in April 2021 with NSTEMI receiving DES to left circumflex, as well as history of HFrEF, EF 25 to 30%  secondary to ischemic cardiomyopathy, HTN and HLD who presenting with typical chest pain    NSTEMI (non-ST elevated myocardial infarction) (HCC)   CAD (coronary artery disease) -Continue heparin infusion -Aspirin, beta-blocker.  Patient statin intolerant -Nitroglycerin sublingual as needed chest pain with morphine for breakthrough -Cardiology consult    Essential hypertension -Coreg.  Patient admits to noncompliance with home meds    Chronic systolic heart failure (HCC) -Not acutely exacerbated -Patient noncompliant with home meds takes Entresto intermittently -Hold meds pending verification and exploration of barriers to compliance such as cost etc.    Type 2 diabetes mellitus with hyperglycemia (HCC) -Sliding scale coverage  Medication noncompliance -Consider case management consult    DVT prophylaxis: Heparin infusion Code Status: full code  Family Communication:  none  Disposition Plan: Back to previous home environment Consults called: Cardiology Status:At the time of admission, it appears that the appropriate admission status for this patient is INPATIENT. This is judged to be reasonable and necessary in order to provide the required intensity of service to ensure the patient's safety given the presenting symptoms, physical exam findings, and initial radiographic and laboratory data in the context of their  Comorbid conditions.   Patient requires inpatient status due to high intensity of service, high risk for further deterioration and high frequency of surveillance required.   I certify that at the point of admission it is my clinical judgment that the patient will require inpatient hospital care spanning beyond 2 midnights     Andris Baumann MD [facility]     04/22/2020, 12:56 AM
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
## 2020-04-22 NOTE — ED Provider Notes (Signed)
[facility] Emergency Department Provider Note   ____________________________________________   First MD Initiated Contact with Patient 04/22/20 0016     (approximate)  I have reviewed the triage vital signs and the nursing notes.   HISTORY  Chief Complaint Chest Pain    HPI [name]. is a 64 y.o. male who presents to the ED from home with a chief complaint of chest tightness.  Patient has a history of CAD, hypertension, ischemic cardiomyopathy with EF 20 to 25% who presents with a 3-day history of chest tightness radiating into his left arm and throat.  Patient admits he is noncompliant with his medications.  The only medication he takes intermittently is Entresto.  Took an expired nitroglycerin today without effect.  Denies associated symptoms of shortness of breath or nausea.  Denies recent fever, cough, abdominal pain, nausea, vomiting, dizziness.  Patient is not vaccinated against COVID-19.     Past Medical History:  Diagnosis Date  . Anginal pain (HCC)   . Arthritis   . CAD (coronary artery disease)    a. s/p MI with LAD and Diag stenting @ [name];  b. 06/2015 Cath: LAD 73m/d ISR, 100 RCA (ISR) w/ L->R collats, otw mod nonobs dzs-->Med Rx; c. 08/2019 Cath: LM nl, LAD 10p/m ISR, D1 20, D2 100, RI min irregs, LCX 43m/d, OM1 100, OM2 50, RCA 100p, 70d. RPDA fills via collats from LAD. EF 25-35%-->Med Rx; d. 10/2019 NSTEMI/Cath: LCX now 100 (2.75x15 Resolute Onyx DES), otw stable compared to 08/2019.  [name] Chronic combined systolic and diastolic CHF (congestive heart failure) (HCC)    a. 06/2015 Echo: EF 20-25%, Gr3 DD; b. 10/2019 Echo: EF 25-30%, glob HK, sev inf/infapical HK. Mod dil LA.  [name] Dyspnea   . Essential hypertension   . Hyperlipidemia   . Hypokalemia    a. 06/2015 in setting of diuresis.  . Ischemic cardiomyopathy    a. 2011 EF 45% ([name]);  b. 06/2015 Echo: EF 20-25%; c. 04/2019 Echo: EF 40-45%; d. 08/2019 LV gram: EF 25-35%; e. 10/2019 Echo: EF  25-30%.  . PVC's (premature ventricular contractions)    a. 09/2019 Zio (3 days): Avg hr 70, 4 runs NSVT, 5 runs SVT, rare PACs, frequent PVCs w/ 11.8% burden.  . Sleep apnea   . Stroke/Right temporal lobe infarction [facility])    a. 10/2019 MRI brain: 1cm acute ischemic nonhemorrhagic R temporal lobe infarct. Age-related cerebral atrophy w/ moderate chronic small vessel ischemic dzs.  . Type 2 diabetes mellitus with hyperglycemia (HCC) 10/08/2019    Patient Active Problem List   Diagnosis Date Noted  . Ischemic stroke (HCC)   . NSTEMI (non-ST elevated myocardial infarction) (HCC) 10/08/2019  . Type 2 diabetes mellitus with hyperglycemia (HCC) 10/08/2019  . Chronic systolic heart failure (HCC)   . Ischemic cardiomyopathy   . Acute systolic CHF (congestive heart failure) (HCC) 06/10/2015  . Unstable angina (HCC) 06/10/2015  . CAD (coronary artery disease) 06/10/2015  . Acute on chronic systolic (congestive) heart failure (HCC) 06/10/2015  . Essential hypertension   . Hyperlipidemia   . CHF (congestive heart failure) (HCC) 06/07/2015    Past Surgical History:  Procedure Laterality Date  . BACK SURGERY  04/2019  . CARDIAC CATHETERIZATION N/A 06/10/2015   Procedure: Left Heart Cath;  Surgeon: Iran Ouch, MD;  Location: [facility] INVASIVE CV LAB;  Service: Cardiovascular;  Laterality: N/A;  . CORONARY STENT INTERVENTION N/A 10/08/2019   Procedure: CORONARY STENT INTERVENTION;  Surgeon: Iran Ouch,  MD;  Location: [facility] INVASIVE CV LAB;  Service: Cardiovascular;  Laterality: N/A;  . CORONARY STENT PLACEMENT    . LEFT HEART CATH AND CORONARY ANGIOGRAPHY N/A 10/08/2019   Procedure: LEFT HEART CATH AND CORONARY ANGIOGRAPHY poss pci;  Surgeon: Iran Ouch, MD;  Location: [facility] INVASIVE CV LAB;  Service: Cardiovascular;  Laterality: N/A;  . RIGHT/LEFT HEART CATH AND CORONARY ANGIOGRAPHY N/A 08/20/2019   Procedure: RIGHT/LEFT HEART CATH AND CORONARY ANGIOGRAPHY;  Surgeon: Iran Ouch, MD;   Location: [facility] INVASIVE CV LAB;  Service: Cardiovascular;  Laterality: N/A;    Prior to Admission medications   Medication Sig Start Date End Date Taking? Authorizing Provider  acetaminophen (TYLENOL) 500 MG tablet Take 1,000 mg by mouth at bedtime as needed. At bedtime and as needed.    [provider]  aspirin 81 MG chewable tablet Chew 1 tablet (81 mg total) by mouth daily. Patient not taking: Reported on 11/14/2019 06/11/15   Auburn Bilberry, MD  clopidogrel (PLAVIX) 75 MG tablet Take 1 tablet (75 mg total) by mouth daily. Patient not taking: Reported on 11/14/2019 10/12/19   Pennie Banter, DO  Clotrimazole [facility] RELIEF EX) Apply 1 application topically daily as needed (jock itch).    [provider]  colchicine 0.6 MG tablet Take 1 tablet (0.6 mg total) by mouth daily as needed (gout). Patient not taking: Reported on 11/14/2019 09/13/19   Creig Hines, NP  ezetimibe (ZETIA) 10 MG tablet Take 1 tablet (10 mg total) by mouth daily. Patient not taking: Reported on 11/14/2019 09/13/19 12/12/19  Creig Hines, NP  furosemide (LASIX) 40 MG tablet Take 1 tablet (40 mg total) by mouth daily as needed. 11/14/19   Creig Hines, NP  lip balm Karlton Lemon) OINT Apply 1 application topically as needed for lip care.    [provider]  nitroGLYCERIN (NITROSTAT) 0.4 MG SL tablet Place 1 tablet (0.4 mg total) under the tongue every 5 (five) minutes as needed for chest pain. Patient not taking: Reported on 11/14/2019 10/11/19   Esaw Grandchild A, DO  sacubitril-valsartan (ENTRESTO) 49-51 MG Take 1 tablet by mouth 2 (two) times daily.    [provider]    Allergies Contrast media [iodinated diagnostic agents], Iohexol, Atorvastatin, Hydrocodone, Morphine and related, Penicillins, and Rosuvastatin  Family History  Problem Relation Age of Onset  . Coronary artery disease Mother   . Diabetes Mother   . Coronary artery disease Father      Social History Social History   Tobacco Use  . Smoking status: Former Smoker    Quit date: 06/10/2011    Years since quitting: 8.8  . Smokeless tobacco: Never Used  Substance Use Topics  . Alcohol use: No    Alcohol/week: 0.0 standard drinks  . Drug use: No    Review of Systems  Constitutional: No fever/chills Eyes: No visual changes. ENT: No sore throat. Cardiovascular: Positive for chest pain. Respiratory: Denies shortness of breath. Gastrointestinal: No abdominal pain.  No nausea, no vomiting.  No diarrhea.  No constipation. Genitourinary: Negative for dysuria. Musculoskeletal: Negative for back pain. Skin: Negative for rash. Neurological: Negative for headaches, focal weakness or numbness.   ____________________________________________   PHYSICAL EXAM:  VITAL SIGNS: ED Triage Vitals  Enc Vitals Group     BP 04/21/20 2301 138/90     Pulse Rate 04/21/20 2301 76     Resp 04/21/20 2301 20     Temp 04/21/20 2301 97.7 F (36.5 C)  Temp Source 04/21/20 2301 Oral     SpO2 04/21/20 2301 93 %     Weight 04/21/20 2257 245 lb (111.1 kg)     Height 04/21/20 2257 5\' 6"  (1.676 m)     Head Circumference --      Peak Flow --      Pain Score 04/21/20 2257 8     Pain Loc --      Pain Edu? --      Excl. in GC? --     Constitutional: Alert and oriented. Well appearing and in mild acute distress. Eyes: Conjunctivae are normal. PERRL. EOMI. Head: Atraumatic. Nose: No congestion/rhinnorhea. Mouth/Throat: Mucous membranes are moist.   Neck: No stridor.   Cardiovascular: Normal rate, regular rhythm. Grossly normal heart sounds.  Good peripheral circulation. Respiratory: Normal respiratory effort.  No retractions. Lungs CTAB. Gastrointestinal: Soft and nontender. No distention. No abdominal bruits. No CVA tenderness. Musculoskeletal: No lower extremity tenderness nor edema.  No joint effusions. Neurologic:  Normal speech and language. No gross focal neurologic deficits  are appreciated. No gait instability. Skin:  Skin is warm, dry and intact. No rash noted. Psychiatric: Mood and affect are normal. Speech and behavior are normal.  ____________________________________________   LABS (all labs ordered are listed, but only abnormal results are displayed)  Labs Reviewed  CBC WITH DIFFERENTIAL/PLATELET - Abnormal; Notable for the following components:      Result Value   MCHC 36.2 (*)    All other components within normal limits  COMPREHENSIVE METABOLIC PANEL - Abnormal; Notable for the following components:   Potassium 3.2 (*)    CO2 19 (*)    Glucose, Bld 262 (*)    Calcium 8.5 (*)    All other components within normal limits  TROPONIN I (HIGH SENSITIVITY) - Abnormal; Notable for the following components:   Troponin I (High Sensitivity) 630 (*)    All other components within normal limits  RESPIRATORY PANEL BY RT PCR (FLU A&B, COVID)  LIPASE, BLOOD  APTT  PROTIME-INR  TROPONIN I (HIGH SENSITIVITY)   ____________________________________________  EKG  ED ECG REPORT I, Zael Shuman J, the attending physician, personally viewed and interpreted this ECG.   Date: 04/22/2020  EKG Time: 2258  Rate: 76  Rhythm: normal EKG, normal sinus rhythm  Axis: Normal  Intervals:left posterior fascicular block  ST&T Change: Nonspecific ____________________________________________  RADIOLOGY I, Sheretta Grumbine J, personally viewed and evaluated these images (plain radiographs) as part of my medical decision making, as well as reviewing the written report by the radiologist.  ED MD interpretation:  No acute cardiopulmonary process  Official radiology report(s): DG Chest 2 View  Result Date: 04/21/2020 CLINICAL DATA:  Chest pain radiating into the left arm EXAM: CHEST - 2 VIEW COMPARISON:  10/06/2019 FINDINGS: Cardiac shadow is within normal limits. Lungs are well aerated bilaterally. No focal infiltrate or sizable effusion is seen. Coronary stents are noted. No  bony abnormality is seen. IMPRESSION: No acute abnormality noted. Electronically Signed   By: 12/06/2019 [name]   On: 04/21/2020 23:28    ____________________________________________   PROCEDURES  Procedure(s) performed (including Critical Care):  .1-3 Lead EKG Interpretation Performed by: 04/23/2020, MD Authorized by: Irean Hong, MD     Interpretation: normal     ECG rate:  75   ECG rate assessment: normal     Rhythm: sinus rhythm     Ectopy: none     Conduction: normal   Comments:     Patient placed  on cardiac monitor to evaluate for arrythmias     ____________________________________________   INITIAL IMPRESSION / ASSESSMENT AND PLAN / ED COURSE  As part of my medical decision making, I reviewed the following data within the electronic MEDICAL RECORD NUMBER Nursing notes reviewed and incorporated, Labs reviewed, EKG interpreted, Old chart reviewed, Radiograph reviewed, Discussed with admitting physician and Notes from prior ED visits     64 year old male with history of CAD, ischemic cardiomyopathy presenting with chest tightness. Differential diagnosis includes, but is not limited to, ACS, aortic dissection, pulmonary embolism, cardiac tamponade, pneumothorax, pneumonia, pericarditis, myocarditis, GI-related causes including esophagitis/gastritis, and musculoskeletal chest wall pain.    Initial troponin 630 with nonspecific EKG.  Will administer aspirin, nitroglycerin paste.  Initiate heparin bolus with drip.  Replete potassium.  Will discuss with hospital services for admission for non-STEMI.      ____________________________________________   FINAL CLINICAL IMPRESSION(S) / ED DIAGNOSES  Final diagnoses:  Unstable angina (HCC)  NSTEMI (non-ST elevated myocardial infarction) (HCC)  Unstable angina pectoris (HCC)  Hypokalemia     ED Discharge Orders    None      *Please note:  Haydin Calandra. was evaluated in Emergency Department on 04/22/2020 for the  symptoms described in the history of present illness. He was evaluated in the context of the global COVID-19 pandemic, which necessitated consideration that the patient might be at risk for infection with the SARS-CoV-2 virus that causes COVID-19. Institutional protocols and algorithms that pertain to the evaluation of patients at risk for COVID-19 are in a state of rapid change based on information released by regulatory bodies including the CDC and federal and state organizations. These policies and algorithms were followed during the patient's care in the ED.  Some ED evaluations and interventions may be delayed as a result of limited staffing during and the pandemic.*   Note:  This document was prepared using Dragon voice recognition software and may include unintentional dictation errors.   Irean Hong, MD 04/22/20 0100

## 2020-04-22 NOTE — Progress Notes (Signed)
PROGRESS NOTE    [name].  ZOX:[id number] DOB: 1956-06-28 DOA: 04/22/2020 PCP: [name], MD  Outpatient Specialists: [facility] cardiology    Brief Narrative:    [name]. is a 64 y.o. male with medical history significant for CAD with history of MI x3, admitted in April 2021 with NSTEMI receiving DES to left circumflex, as well as history of HFrEF, EF 25 to 30% secondary to ischemic cardiomyopathy, HTN and HLD who presents to the emergency room with retrosternal chest pain radiating to the back throat and arm of severe intensity similar to prior heart attacks, associated with nausea and diaphoresis.  He denies shortness of breath, cough fever or chills, lower extremity pain or swelling.  Took nitroglycerin with minimal improvement. ED Course: On arrival, vitals unremarkable, EKG with no acute ST-T wave changes on normal sinus rhythm, troponin VI 130.  Blood work otherwise unremarkable except for elevated blood sugar of 262.  Chest x-ray with no acute disease.  Patient started on a heparin infusion.    Assessment & Plan:   Principal Problem:   NSTEMI (non-ST elevated myocardial infarction) [facility]) Active Problems:   CAD (coronary artery disease)   Essential hypertension   Chronic systolic heart failure (HCC)   Type 2 diabetes mellitus with hyperglycemia (HCC)   64 year old male with history of CAD with history of MI x3, admitted in April 2021 with NSTEMI receiving DES to left circumflex, as well as history of HFrEF, EF 25 to 30% secondary to ischemic cardiomyopathy, HTN and HLD who presenting with typical chest pain in the setting of poor compliance w/ home meds  NSTEMI (non-ST elevated myocardial infarction) (HCC) CAD (coronary artery disease) Several days of chest pain, left arm pain, which he says is more or less typical for him. This in the setting of poor compliance w/ home meds. Troponins elevated 630 >599. No ST elevations/depressionis on EKG. --will give nitro  x1, says mild substernal chest pain has returned. Will also repeat ekg and cycle another troponin -Continue heparin infusion -Aspirin, beta-blocker.  Patient statin intolerant - morphine for breakthrough -Cardiology consulted  Essential hypertension  BP low normal - cont coreg, entresto  Hypokalemia 3.2 on admission. Received oral suppelementation - f/u repeat bmp and Mg  Chronic systolic heart failure (HCC) EF 25-30% on TTE earlier this year. Appears compensated - cont home entresto, says has been taking that more or less regularly - continue carvedilol  Type 2 diabetes mellitus with hyperglycemia (HCC) Here glucose not significantly elevated -Sliding scale coverage     DVT prophylaxis: IV heparin Code Status: Full Family Communication: none at bedside  Status is: Inpatient  Remains inpatient appropriate because:Inpatient level of care appropriate due to severity of illness   Dispo: The patient is from: Home              Anticipated d/c is to: Home              Anticipated d/c date is: 3 days              Patient currently is not medically stable to d/c.        Consultants:  cardiology  Procedures: none  Antimicrobials:  None     Subjective: This morning mild chest pain. No n/v/d. No fevers. No shortness of breath.  Objective: Vitals:   04/22/20 0145 04/22/20 0200 04/22/20 0230 04/22/20 0448  BP:  129/89 112/70 (!) 107/56  Pulse: (!) 59 64 (!) 58 (!) 56  Resp: 19  18 18   Temp:    98 F (36.7 C)  TempSrc:    Oral  SpO2: 95% 94% 93% 96%  Weight:      Height:       No intake or output data in the 24 hours ending 04/22/20 0755 Filed Weights   04/21/20 2257  Weight: 111.1 kg    Examination:  General exam: Appears calm and comfortable. dissheveled Respiratory system: Clear to auscultation. Respiratory effort normal. Cardiovascular system: S1 & S2 heard, RRR. Soft systolic murmur Gastrointestinal system: Abdomen is nondistended, obese  and nontender. No organomegaly or masses felt. Normal bowel sounds heard. Central nervous system: Alert and oriented. No focal neurological deficits. Extremities: Symmetric 5 x 5 power. Skin: No rashes, lesions or ulcers Psychiatry: Judgement and insight appear normal. Mood & affect appropriate.     Data Reviewed: I have personally reviewed following labs and imaging studies  CBC: Recent Labs  Lab 04/21/20 2301  WBC 9.3  NEUTROABS 5.8  HGB 15.8  HCT 43.6  MCV 85.0  PLT 244   Basic Metabolic Panel: Recent Labs  Lab 04/21/20 2301  NA 138  K 3.2*  CL 107  CO2 19*  GLUCOSE 262*  BUN 13  CREATININE 0.87  CALCIUM 8.5*   GFR: Estimated Creatinine Clearance: 101.7 mL/min (by C-G formula based on SCr of 0.87 mg/dL). Liver Function Tests: Recent Labs  Lab 04/21/20 2301  AST 25  ALT 14  ALKPHOS 70  BILITOT 1.1  PROT 7.1  ALBUMIN 4.0   Recent Labs  Lab 04/21/20 1958  LIPASE 31   No results for input(s): AMMONIA in the last 168 hours. Coagulation Profile: Recent Labs  Lab 04/22/20 0058  INR 1.0   Cardiac Enzymes: No results for input(s): CKTOTAL, CKMB, CKMBINDEX, TROPONINI in the last 168 hours. BNP (last 3 results) No results for input(s): PROBNP in the last 8760 hours. HbA1C: No results for input(s): HGBA1C in the last 72 hours. CBG: No results for input(s): GLUCAP in the last 168 hours. Lipid Profile: No results for input(s): CHOL, HDL, LDLCALC, TRIG, CHOLHDL, LDLDIRECT in the last 72 hours. Thyroid Function Tests: No results for input(s): TSH, T4TOTAL, FREET4, T3FREE, THYROIDAB in the last 72 hours. Anemia Panel: No results for input(s): VITAMINB12, FOLATE, FERRITIN, TIBC, IRON, RETICCTPCT in the last 72 hours. Urine analysis: No results found for: COLORURINE, APPEARANCEUR, LABSPEC, PHURINE, GLUCOSEU, HGBUR, BILIRUBINUR, KETONESUR, PROTEINUR, UROBILINOGEN, NITRITE, LEUKOCYTESUR Sepsis Labs: @LABRCNTIP (procalcitonin:4,lacticidven:4)  ) Recent  Results (from the past 240 hour(s))  Respiratory Panel by RT PCR (Flu A&B, Covid) - Nasopharyngeal Swab     Status: None   Collection Time: 04/22/20 12:58 AM   Specimen: Nasopharyngeal Swab  Result Value Ref Range Status   SARS Coronavirus 2 by RT PCR NEGATIVE NEGATIVE Final    Comment: (NOTE) SARS-CoV-2 target nucleic acids are NOT DETECTED.  The SARS-CoV-2 RNA is generally detectable in upper respiratoy specimens during the acute phase of infection. The lowest concentration of SARS-CoV-2 viral copies this assay can detect is 131 copies/mL. A negative result does not preclude SARS-Cov-2 infection and should not be used as the sole basis for treatment or other patient management decisions. A negative result may occur with  improper specimen collection/handling, submission of specimen other than nasopharyngeal swab, presence of viral mutation(s) within the areas targeted by this assay, and inadequate number of viral copies (<131 copies/mL). A negative result must be combined with clinical observations, patient history, and epidemiological information. The expected result is Negative.  Fact Sheet for Patients:  https://www.moore.com/  Fact Sheet for Healthcare Providers:  https://www.young.biz/  This test is no t yet approved or cleared by the [place] FDA and  has been authorized for detection and/or diagnosis of SARS-CoV-2 by FDA under an Emergency Use Authorization (EUA). This EUA will remain  in effect (meaning this test can be used) for the duration of the COVID-19 declaration under Section 564(b)(1) of the Act, 21 U.S.C. section 360bbb-3(b)(1), unless the authorization is terminated or revoked sooner.     Influenza A by PCR NEGATIVE NEGATIVE Final   Influenza B by PCR NEGATIVE NEGATIVE Final    Comment: (NOTE) The Xpert Xpress SARS-CoV-2/FLU/RSV assay is intended as an aid in  the diagnosis of influenza from Nasopharyngeal swab  specimens and  should not be used as a sole basis for treatment. Nasal washings and  aspirates are unacceptable for Xpert Xpress SARS-CoV-2/FLU/RSV  testing.  Fact Sheet for Patients: https://www.moore.com/  Fact Sheet for Healthcare Providers: https://www.young.biz/  This test is not yet approved or cleared by the [place] FDA and  has been authorized for detection and/or diagnosis of SARS-CoV-2 by  FDA under an Emergency Use Authorization (EUA). This EUA will remain  in effect (meaning this test can be used) for the duration of the  Covid-19 declaration under Section 564(b)(1) of the Act, 21  U.S.C. section 360bbb-3(b)(1), unless the authorization is  terminated or revoked. Performed at [facility], [address], [place], [place] [address]          Radiology Studies: DG Chest 2 View  Result Date: 04/21/2020 CLINICAL DATA:  Chest pain radiating into the left arm EXAM: CHEST - 2 VIEW COMPARISON:  10/06/2019 FINDINGS: Cardiac shadow is within normal limits. Lungs are well aerated bilaterally. No focal infiltrate or sizable effusion is seen. Coronary stents are noted. No bony abnormality is seen. IMPRESSION: No acute abnormality noted. Electronically Signed   By: Alcide Clever [name]   On: 04/21/2020 23:28        Scheduled Meds: . [START ON 04/23/2020] aspirin EC  81 mg Oral Daily  . carvedilol  3.125 mg Oral BID WC   Continuous Infusions: . heparin 1,200 Units/hr (04/22/20 0110)     LOS: 0 days    Time spent: 35 min    Silvano Bilis, MD [facility]   If 7PM-7AM, please contact night-coverage [email or web address] Password [email or web address] 04/22/2020, 7:55 AM

## 2020-04-22 NOTE — Progress Notes (Signed)
ANTICOAGULATION CONSULT NOTE - Initial Consult  Pharmacy Consult for Heparin  Indication: chest pain/ACS  Allergies  Allergen Reactions  . Contrast Media [Iodinated Diagnostic Agents] Shortness Of Breath  . Iohexol Shortness Of Breath     Onset Date: [id number]   . Atorvastatin     Myalgias   . Hydrocodone Itching  . Morphine And Related     Lost control   . Penicillins     Unknown reaction Did it involve swelling of the face/tongue/throat, SOB, or low BP? Unknown Did it involve sudden or severe rash/hives, skin peeling, or any reaction on the inside of your mouth or nose? Unknown Did you need to seek medical attention at a hospital or doctor's office? Yes When did it last happen?Childhood allergy  If all above answers are "NO", may proceed with cephalosporin use.   . Rosuvastatin     Myalgias     Patient Measurements: Height: 5\' 6"  (167.6 cm) Weight: 111.1 kg (245 lb) IBW/kg (Calculated) : 63.8 Heparin Dosing Weight: 89.2 kg   Vital Signs: Temp: 97.7 F (36.5 C) (10/18 2301) Temp Source: Oral (10/18 2301) BP: 131/74 (10/19 0100) Pulse Rate: 58 (10/19 0100)  Labs: Recent Labs    04/21/20 2301  HGB 15.8  HCT 43.6  PLT 244  CREATININE 0.87  TROPONINIHS 630*    Estimated Creatinine Clearance: 101.7 mL/min (by C-G formula based on SCr of 0.87 mg/dL).   Medical History: Past Medical History:  Diagnosis Date  . Anginal pain (HCC)   . Arthritis   . CAD (coronary artery disease)    a. s/p MI with LAD and Diag stenting @ [name];  b. 06/2015 Cath: LAD 58m/d ISR, 100 RCA (ISR) w/ L->R collats, otw mod nonobs dzs-->Med Rx; c. 08/2019 Cath: LM nl, LAD 10p/m ISR, D1 20, D2 100, RI min irregs, LCX 15m/d, OM1 100, OM2 50, RCA 100p, 70d. RPDA fills via collats from LAD. EF 25-35%-->Med Rx; d. 10/2019 NSTEMI/Cath: LCX now 100 (2.75x15 Resolute Onyx DES), otw stable compared to 08/2019.  09/2019 Chronic combined systolic and diastolic CHF (congestive heart failure) (HCC)    a.  06/2015 Echo: EF 20-25%, Gr3 DD; b. 10/2019 Echo: EF 25-30%, glob HK, sev inf/infapical HK. Mod dil LA.  12-13-1985 Dyspnea   . Essential hypertension   . Hyperlipidemia   . Hypokalemia    a. 06/2015 in setting of diuresis.  . Ischemic cardiomyopathy    a. 2011 EF 45% ([name]);  b. 06/2015 Echo: EF 20-25%; c. 04/2019 Echo: EF 40-45%; d. 08/2019 LV gram: EF 25-35%; e. 10/2019 Echo: EF 25-30%.  . PVC's (premature ventricular contractions)    a. 09/2019 Zio (3 days): Avg hr 70, 4 runs NSVT, 5 runs SVT, rare PACs, frequent PVCs w/ 11.8% burden.  . Sleep apnea   . Stroke/Right temporal lobe infarction [facility])    a. 10/2019 MRI brain: 1cm acute ischemic nonhemorrhagic R temporal lobe infarct. Age-related cerebral atrophy w/ moderate chronic small vessel ischemic dzs.  . Type 2 diabetes mellitus with hyperglycemia (HCC) 10/08/2019    Medications:  (Not in a hospital admission)   Assessment: Pharmacy consulted to dose heparin in this 64 year old male admitted with ACS/NSTEMI.  No prior anticoag noted.  CrCl = 101.7 ml/min   Goal of Therapy:  Heparin level 0.3-0.7 units/ml Monitor platelets by anticoagulation protocol: Yes   Plan:  Give 4000 units bolus x 1 Start heparin infusion at 1200 units/hr Check anti-Xa level in 6 hours and daily while on heparin  Continue to monitor H&H and platelets  Duanne Duchesne D 04/22/2020,1:19 AM

## 2020-04-23 DIAGNOSIS — E1165 Type 2 diabetes mellitus with hyperglycemia: Secondary | ICD-10-CM

## 2020-04-23 LAB — CBC
HCT: 44.8 % (ref 39.0–52.0)
Hemoglobin: 15.9 g/dL (ref 13.0–17.0)
MCH: 30.9 pg (ref 26.0–34.0)
MCHC: 35.5 g/dL (ref 30.0–36.0)
MCV: 87 fL (ref 80.0–100.0)
Platelets: 227 10*3/uL (ref 150–400)
RBC: 5.15 MIL/uL (ref 4.22–5.81)
RDW: 14.8 % (ref 11.5–15.5)
WBC: 7.4 10*3/uL (ref 4.0–10.5)
nRBC: 0 % (ref 0.0–0.2)

## 2020-04-23 LAB — BASIC METABOLIC PANEL
Anion gap: 10 (ref 5–15)
BUN: 14 mg/dL (ref 8–23)
CO2: 23 mmol/L (ref 22–32)
Calcium: 8.9 mg/dL (ref 8.9–10.3)
Chloride: 104 mmol/L (ref 98–111)
Creatinine, Ser: 0.85 mg/dL (ref 0.61–1.24)
GFR, Estimated: >60 mL/min (ref >60)
Glucose, Bld: 156 mg/dL — ABNORMAL HIGH (ref 70–99)
Potassium: 3.5 mmol/L (ref 3.5–5.1)
Sodium: 137 mmol/L (ref 135–145)

## 2020-04-23 LAB — HEPARIN LEVEL (UNFRACTIONATED): Heparin Unfractionated: 0.36 IU/mL (ref 0.30–0.70)

## 2020-04-23 LAB — MAGNESIUM: Magnesium: 2 mg/dL (ref 1.7–2.4)

## 2020-04-23 MED ORDER — POTASSIUM CHLORIDE 20 MEQ PO PACK
40.0000 meq | PACK | Freq: Once | ORAL | Status: AC
  Administered 2020-04-23: 40 meq via ORAL
  Filled 2020-04-23: qty 2

## 2020-04-23 NOTE — Plan of Care (Signed)

## 2020-04-23 NOTE — Progress Notes (Deleted)
Attempted to call the facility x3 to give report. No answer.

## 2020-04-23 NOTE — Progress Notes (Signed)
Mobility Specialist - Progress Note   04/23/20 1257  Mobility  Activity Refused mobility  Mobility performed by Mobility specialist    Pt refused session at this time. No reason specified. Per discussion w/ nurse, pt ambulating/mobilizing independently.    Christyan Reger Mobility Specialist  04/23/20, 12:58 PM

## 2020-04-23 NOTE — Progress Notes (Signed)
Progress Note  Patient Name: [name]. Date of Encounter: 04/23/2020  Primary Cardiologist: Lorine Bears, MD   Subjective   Inform patient of decision for medical management.  Restarted his diet earlier this morning, as he had been placed on n.p.o. status.  He denies current chest pain or shortness of breath though did report earlier CP now resolved.  Inpatient Medications    Scheduled Meds: . aspirin EC  81 mg Oral Daily  . carvedilol  3.125 mg Oral BID WC  . clopidogrel  75 mg Oral Daily  . ezetimibe  10 mg Oral Daily  . furosemide  40 mg Oral Daily  . influenza vac split quadrivalent PF  0.5 mL Intramuscular Tomorrow-1000  . isosorbide mononitrate  15 mg Oral Daily  . loratadine  10 mg Oral Daily   Continuous Infusions: . heparin 1,600 Units/hr (04/23/20 7564)   PRN Meds: acetaminophen, nitroGLYCERIN, ondansetron (ZOFRAN) IV   Vital Signs    Vitals:   04/23/20 0450 04/23/20 0451 04/23/20 0846 04/23/20 1203  BP: 129/69  (!) 106/49 107/78  Pulse: (!) 58  (!) 56 (!) 58  Resp: 20  19 19   Temp: 97.6 F (36.4 C)  98.3 F (36.8 C) 98.2 F (36.8 C)  TempSrc: Oral  Oral Oral  SpO2: 99%  96% 93%  Weight:  97.8 kg    Height:        Intake/Output Summary (Last 24 hours) at 04/23/2020 1204 Last data filed at 04/23/2020 04/25/2020 Gross per 24 hour  Intake 470.33 ml  Output 400 ml  Net 70.33 ml   Last 3 Weights 04/23/2020 04/22/2020 04/21/2020  Weight (lbs) 215 lb 9.6 oz 215 lb 11.2 oz 245 lb  Weight (kg) 97.796 kg 97.841 kg 111.131 kg      Telemetry    NSR to SB, PVCs- Personally Reviewed  ECG    No new tracings- Personally Reviewed  Physical Exam   GEN: No acute distress.   Neck: No JVD Cardiac:  Bradycardic but regular, no murmurs, rubs, or gallops.  Respiratory: Clear to auscultation bilaterally. GI: Soft, nontender, non-distended  MS: No edema; No deformity. Neuro:  Nonfocal  Psych: Normal affect   Labs    High Sensitivity Troponin:     Recent Labs  Lab 04/21/20 2301 04/22/20 0058 04/22/20 0643 04/22/20 1524 04/22/20 2222  TROPONINIHS 630* 599* 1,609* 1,606* 1,362*      Chemistry Recent Labs  Lab 04/21/20 2301 04/22/20 0643 04/23/20 0434  NA 138 140 137  K 3.2* 3.7 3.5  CL 107 108 104  CO2 19* 21* 23  GLUCOSE 262* 143* 156*  BUN 13 11 14   CREATININE 0.87 0.78 0.85  CALCIUM 8.5* 8.6* 8.9  PROT 7.1  --   --   ALBUMIN 4.0  --   --   AST 25  --   --   ALT 14  --   --   ALKPHOS 70  --   --   BILITOT 1.1  --   --   GFRNONAA >60 >60 >60  ANIONGAP 12 11 10      Hematology Recent Labs  Lab 04/21/20 2301 04/22/20 1912 04/23/20 0434  WBC 9.3 9.1 7.4  RBC 5.13 5.21 5.15  HGB 15.8 15.9 15.9  HCT 43.6 44.9 44.8  MCV 85.0 86.2 87.0  MCH 30.8 30.5 30.9  MCHC 36.2* 35.4 35.5  RDW 14.4 14.6 14.8  PLT 244 237 227    BNPNo results for input(s): BNP, PROBNP in  the last 168 hours.   DDimer No results for input(s): DDIMER in the last 168 hours.   Radiology    DG Chest 2 View  Result Date: 04/21/2020 CLINICAL DATA:  Chest pain radiating into the left arm EXAM: CHEST - 2 VIEW COMPARISON:  10/06/2019 FINDINGS: Cardiac shadow is within normal limits. Lungs are well aerated bilaterally. No focal infiltrate or sizable effusion is seen. Coronary stents are noted. No bony abnormality is seen. IMPRESSION: No acute abnormality noted. Electronically Signed   By: Alcide Clever [name]   On: 04/21/2020 23:28   ECHOCARDIOGRAM COMPLETE  Result Date: 04/22/2020    ECHOCARDIOGRAM REPORT   Patient Name:   [name]. Date of Exam: 04/22/2020 Medical Rec #:  [id number]         Height:       66.0 in Accession #:    [id number]        Weight:       245.0 lb Date of Birth:  02-10-1956          BSA:          2.180 m Patient Age:    64 years          BP:           111/82 mmHg Patient Gender: M                 HR:           56 bpm. Exam Location:  [facility] Procedure: 2D Echo, Color Doppler, Cardiac Doppler and Intracardiac             Opacification Agent Indications:     I50.9 Congestive Heart Failure; I21.4 NSTEMI  History:         Patient has prior history of Echocardiogram examinations, most                  recent 10/07/2019. ICM, CAD, Stroke; Risk Factors:Sleep Apnea,                  Diabetes, Hypertension and Dyslipidemia.  Sonographer:     Humphrey Rolls RDCS (AE) Referring Phys:  [id number] Lennon Alstrom Diagnosing Phys: Yvonne Kendall MD  Sonographer Comments: Technically difficult study due to poor echo windows. Image acquisition challenging due to patient body habitus. IMPRESSIONS  1. Left ventricular ejection fraction, by estimation, is 25 to 30%. The left ventricle has severely decreased function. The left ventricle demonstrates global hypokinesis. The left ventricular internal cavity size was moderately dilated. Left ventricular diastolic parameters are consistent with Grade II diastolic dysfunction (pseudonormalization). Elevated left atrial pressure. There is akinesis of the left ventricular, entire inferior wall and inferolateral wall.  2. Right ventricular systolic function is mildly reduced. The right ventricular size is normal.  3. Left atrial size was mildly dilated.  4. Right atrial size was mildly dilated.  5. The mitral valve is grossly normal. Mild to moderate mitral valve regurgitation. No evidence of mitral stenosis.  6. The aortic valve has an indeterminant number of cusps. There is mild thickening of the aortic valve. Aortic valve regurgitation is not visualized. No aortic stenosis is present.  7. Aortic dilatation noted. There is borderline dilatation of the aortic root, measuring 38 mm. FINDINGS  Left Ventricle: Left ventricular ejection fraction, by estimation, is 25 to 30%. The left ventricle has severely decreased function. The left ventricle demonstrates global hypokinesis. Definity contrast agent was given IV to delineate the left ventricular endocardial borders. The left  ventricular internal cavity size was  moderately dilated. There is no left ventricular hypertrophy. Left ventricular diastolic parameters are consistent with Grade II diastolic dysfunction (pseudonormalization). Elevated left atrial pressure. Right Ventricle: The right ventricular size is normal. No increase in right ventricular wall thickness. Right ventricular systolic function is mildly reduced. Left Atrium: Left atrial size was mildly dilated. Right Atrium: Right atrial size was mildly dilated. Pericardium: There is no evidence of pericardial effusion. Mitral Valve: The mitral valve is grossly normal. Mild to moderate mitral valve regurgitation. No evidence of mitral valve stenosis. MV peak gradient, 3.4 mmHg. The mean mitral valve gradient is 1.0 mmHg. Tricuspid Valve: The tricuspid valve is not well visualized. Tricuspid valve regurgitation is trivial. Aortic Valve: The aortic valve has an indeterminant number of cusps. There is mild thickening of the aortic valve. Aortic valve regurgitation is not visualized. No aortic stenosis is present. Aortic valve mean gradient measures 2.0 mmHg. Aortic valve peak gradient measures 5.0 mmHg. Aortic valve area, by VTI measures 3.32 cm. Pulmonic Valve: The pulmonic valve was not well visualized. Pulmonic valve regurgitation is not visualized. No evidence of pulmonic stenosis. Aorta: Aortic dilatation noted. There is borderline dilatation of the aortic root, measuring 38 mm. Pulmonary Artery: The pulmonary artery is not well seen. Venous: The inferior vena cava was not well visualized. IAS/Shunts: The interatrial septum was not well visualized.  LEFT VENTRICLE PLAX 2D LVIDd:         6.08 cm  Diastology LVIDs:         5.02 cm  LV e' medial:    3.48 cm/s LV PW:         0.87 cm  LV E/e' medial:  21.0 LV IVS:        0.96 cm  LV e' lateral:   7.62 cm/s LVOT diam:     2.30 cm  LV E/e' lateral: 9.6 LV SV:         66 LV SV Index:   30 LVOT Area:     4.15 cm  LEFT ATRIUM           Index       RIGHT ATRIUM            Index LA diam:      3.60 cm 1.65 cm/m  RA Area:     21.07 cm 9.67 cm/m LA Vol (A2C): 51.7 ml 23.72 ml/m LA Vol (A4C): 73.2 ml 33.58 ml/m  AORTIC VALVE                   PULMONIC VALVE AV Area (Vmax):    3.12 cm    PV Vmax:       0.82 m/s AV Area (Vmean):   3.39 cm    PV Vmean:      57.100 cm/s AV Area (VTI):     3.32 cm    PV VTI:        0.181 m AV Vmax:           112.00 cm/s PV Peak grad:  2.7 mmHg AV Vmean:          71.100 cm/s PV Mean grad:  1.0 mmHg AV VTI:            0.200 m AV Peak Grad:      5.0 mmHg AV Mean Grad:      2.0 mmHg LVOT Vmax:         84.20 cm/s LVOT Vmean:  58.000 cm/s LVOT VTI:          0.160 m LVOT/AV VTI ratio: 0.80  AORTA Ao Root diam: 3.80 cm MITRAL VALVE MV Area (PHT): 3.27 cm    SHUNTS MV Peak grad:  3.4 mmHg    Systemic VTI:  0.16 m MV Mean grad:  1.0 mmHg    Systemic Diam: 2.30 cm MV Vmax:       0.92 m/s MV Vmean:      54.0 cm/s MV Decel Time: 232 msec MV E velocity: 73.20 cm/s MV A velocity: 54.80 cm/s MV E/A ratio:  1.34 Yvonne Kendall MD Electronically signed by Yvonne Kendall MD Signature Date/Time: 04/22/2020/1:52:47 PM    Final     Cardiac Studies   Echo 04/22/20 1. Left ventricular ejection fraction, by estimation, is 25 to 30%. The  left ventricle has severely decreased function. The left ventricle  demonstrates global hypokinesis. The left ventricular internal cavity size  was moderately dilated. Left  ventricular diastolic parameters are consistent with Grade II diastolic  dysfunction (pseudonormalization). Elevated left atrial pressure. There is  akinesis of the left ventricular, entire inferior wall and inferolateral  wall.  2. Right ventricular systolic function is mildly reduced. The right  ventricular size is normal.  3. Left atrial size was mildly dilated.  4. Right atrial size was mildly dilated.  5. The mitral valve is grossly normal. Mild to moderate mitral valve  regurgitation. No evidence of mitral stenosis.  6. The aortic  valve has an indeterminant number of cusps. There is mild  thickening of the aortic valve. Aortic valve regurgitation is not  visualized. No aortic stenosis is present.  7. Aortic dilatation noted. There is borderline dilatation of the aortic  root, measuring 38 mm.   Carotids 10/09/2019 IMPRESSION: Minimal to moderate amount of bilateral atherosclerotic plaque, right greater than left, not resulting in a hemodynamically significant stenosis within either internal carotid artery.  LHC 10/08/2019  1st Diag lesion is 60% stenosed.  Prox LAD to Mid LAD lesion is 10% stenosed.  2nd Diag lesion is 100% stenosed.  1st Mrg lesion is 100% stenosed.  Mid Cx to Dist Cx lesion is 100% stenosed.  Prox RCA to Mid RCA lesion is 100% stenosed.  Dist RCA lesion is 70% stenosed.  2nd Mrg lesion is 60% stenosed.  Post intervention, there is a 0% residual stenosis. A drug-eluting stent was successfully placed using a STENT RESOLUTE ONYX [id number].   1. Significant underlying three-vessel coronary artery disease with patent stents in the LAD and diagonal with moderate in-stent restenosis in the diagonal stent. Chronically occluded RCA stents with right to right bridging and left-to-right collaterals. Diffuse small vessel disease. The mid/distal left circumflex which was significantly diseased recently is now completely occluded which is the likely culprit for myocardial infarction. This supplies relatively small size OM 3 which has moderate diffuse atherosclerosis. 2. Left ventricular angiography was not performed. EF was 25 to 30% by echo. 3. Severely elevated left ventricular end-diastolic pressure at 33 mmHg 4. Successful angioplasty and drug-eluting stent placement to the left circumflex. Recommendations: I elected to intervene on the left circumflex given that troponin was still rising and the patient with residual chest pain. Dual antiplatelet therapy for at least 1 year. Resume  heart failure medications. Resume furosemide as the patient is significantly volume overloaded. Possible discharge home tomorrow.  Echo 10/07/19 1. Left ventricular ejection fraction, by estimation, is 25 to 30%. The  left ventricle has severely decreased function. The left  ventricle  demonstrates global hypokinesis with severe hypokinesis of the inferior  wall and inferoapical region. The left  ventricular internal cavity size was moderately dilated. Left ventricular  diastolic parameters are indeterminate.  2. Right ventricular systolic function is normal. The right ventricular  size is normal. Tricuspid regurgitation signal is inadequate for assessing  PA pressure.  3. Left atrial size was mild to moderately dilated.    Patient Profile     64 y.o. male with a hx of CAD s/p prior LAD, diagonal, and RCA stenting with subsequent known occlusion of the RCA, diagnostic cardiac catheterization 08/20/19 with repeat cath performed today 10/08/19, ischemic CM, chronic combined systolic and diastolic CHF with EF 25-35%, HTN, HLD, medication noncompliance, and who is being seen today for the evaluation of NSTEMI at the request of Dr. Ashok Pall.  Assessment & Plan    NSTEMI  Coronary artery disease --No CP or SOB. Multiple stents as outlined above. LHC 10/2019 with significant 3v CAD, severely elevated LVEDP, and PCI / DES to LCx.  Repeat echo with EF 25-30% (unchanged) with global hypokinesis and akinesis of the inferior/inferior lateral wall. --High-sensitivity troponin  1609 and downtrending. --EKG sinus bradycardia without acute ST/T changes. --Plan for medical management only.  No plan for intervention this admission. --IV heparin for at least 48 hours. Started 01:19 AM on 10/19 per EMR.  Continue today and discontinue tomorrow AM.   --Resumed diet given no plan for LHC.  --Restarted on DAPT with loading dose of clopidogrel 300 mg daily yesterday x1.  --Continue maintenance dose clopidogrel 75  mg daily.   --Continue ASA 81 mg daily. --If heart rate/BP allows, continue low-dose carvedilol. --Per rounding cardiologist, Imdur discontinued with recommendation for Ranexa if QTC allows and if ongoing chest pain reported. --Once BP allows, recommend addition of ACE/ARB.  Entresto discontinued yesterday, given low BP and patient report that it makes him feel poorly.    --Medication compliance stressed as previously noted. --Recommend outpatient follow-up.  HFrEF Ischemic CM --No shortness of breath.  --Updated echo as above with EF 25 to 30%. --As above, no further ischemic work-up this admission. --Euvolemic on exam. --Continue Lasix 40 mg daily. --Continue to monitor I's/O, daily weights, Daily BMET to monitor renal function electrolytes. --Output net +70.3 cc yesterday.  Weight 97.8 kg. --As above, recommend ACE/ARB once BP allows. --Continue low-dose Coreg 3.125 mg twice daily. --Entresto discontinued, as patient reports it makes him feel poorly. --Defer addition of spironolactone given low BP and medication noncompliance at this time.  Essential hypertension --Blood pressure well controlled to soft.  Continue current medications as BP allows.  HLD Statin intolerance --Restarted Zetia.  LDL 75 in April 2021.  Reports history of noncompliance with Zetia.  As previously noted, he is likely not a good candidate for PCSK9 inhibitor therapy given his history of noncompliance.  Consider bempedoic acid in the future if LDL not at goal.  Aortic root dilation --Echo shows aortic root dilation at 38 mm.  Recommend outpatient echo/CT to be performed per monitoring guidelines.  BP and heart rate control.  Continue DAPT.  Continue Zetia.  LDL control  Premature ventricular contractions --Event monitoring showed 11.8% burden.  Denies palpitations at this time. Consider OP EP evaluation in the future.   DM2 --Sliding scale insulin, per IM.  For questions or updates, please contact  [facility] Please consult [email or web address] for contact info under        Signed, Lennon Alstrom, PA-C  04/23/2020, 12:04 PM

## 2020-04-23 NOTE — Progress Notes (Signed)
PROGRESS NOTE    [name].  UEA:[id number] DOB: Feb 18, 1956 DOA: 04/22/2020 PCP: [name], MD   Chief complaint.  Chest pain shortness of breath. Brief Narrative:  Glen ARGUIJO Jamesis a 64 y.o.malewith medical history significant forCAD with history of MI x3, admitted in April 2021 with NSTEMI receiving DES to left circumflex, as well as history of HFrEF, EF 25 to 30% secondary to ischemic cardiomyopathy, HTN and HLD who presents to the emergency room with retrosternal chest pain radiating to the back throat and arm of severe intensity similar to prior heart attacks, associated with nausea and diaphoresis. He denies shortness of breath, cough fever or chills, lower extremity pain or swelling. Took nitroglycerin with minimal improvement. ED Course:On arrival, vitals unremarkable, EKG with no acute ST-T wave changes on normal sinus rhythm, troponin VI 130. Blood work otherwise unremarkable except for elevated blood sugar of 262. Chest x-ray with no acute disease. Patient started on a heparin infusion.   Patient is seen by cardiology, decided to treat medically.  Continue heparin for total course of 48 hours.   Assessment & Plan:   Principal Problem:   NSTEMI (non-ST elevated myocardial infarction) (HCC) Active Problems:   CAD (coronary artery disease)   Essential hypertension   Chronic systolic heart failure (HCC)   Type 2 diabetes mellitus with hyperglycemia (HCC)  #1.  Non-ST elevation myocardial infarction. Patient has been seen by cardiology, deemed to be not a candidate for intervention.  We will treat medically.  Patient has not been able to tolerate statin, continue heparin for 48 hours. Continue aspirin beta-blocker.  2.  Chronic systolic congestive heart failure. No exacerbation.  3.  Essential hypertension.  Continue home medicines per  4.  Hypokalemia. Continue supplement.  5.  Type 2 diabetes uncontrolled with hyperglycemia. Continue current  regimen    DVT prophylaxis: IV heparin Code Status: Full Family Communication: None  .   Status is: Inpatient  Remains inpatient appropriate because:Inpatient level of care appropriate due to severity of illness   Dispo: The patient is from: Home              Anticipated d/c is to: Home              Anticipated d/c date is: 1 day              Patient currently is not medically stable to d/c.        I/O last 3 completed shifts: In: 470.3 [I.V.:470.3] Out: 400 [Urine:400] No intake/output data recorded.     Consultants:   Cardiology  Procedures: None  Antimicrobials:None  Subjective: Doing better today.  No additional pain no chest, no shortness of breath. No abdominal pain or nausea vomiting. No fever chills. No headache or dizziness. No dysuria hematuria.  Objective: Vitals:   04/23/20 0450 04/23/20 0451 04/23/20 0846 04/23/20 1203  BP: 129/69  (!) 106/49 107/78  Pulse: (!) 58  (!) 56 (!) 58  Resp: Temp: 97.6 F (36.4 C)  98.3 F (36.8 C) 98.2 F (36.8 C)  TempSrc: Oral  Oral Oral  SpO2: 99%  96% 93%  Weight:  97.8 kg    Height:        Intake/Output Summary (Last 24 hours) at 04/23/2020 1305 Last data filed at 04/23/2020 4782 Gross per 24 hour  Intake 470.33 ml  Output 400 ml  Net 70.33 ml   Filed Weights   04/21/20 2257 04/22/20 2200 04/23/20  0451  Weight: 111.1 kg 97.8 kg 97.8 kg    Examination:  General exam: Appears calm and comfortable  Respiratory system: Clear to auscultation. Respiratory effort normal. Cardiovascular system: S1 & S2 heard, RRR. No JVD, murmurs, rubs, gallops or clicks. No pedal edema. Gastrointestinal system: Abdomen is nondistended, soft and nontender. No organomegaly or masses felt. Normal bowel sounds heard. Central nervous system: Alert and oriented. No focal neurological deficits. Extremities: Symmetric  Skin: No rashes, lesions or ulcers Psychiatry: Judgement and insight appear normal. Mood &  affect appropriate.     Data Reviewed: I have personally reviewed following labs and imaging studies  CBC: Recent Labs  Lab 04/21/20 2301 04/22/20 1912 04/23/20 0434  WBC 9.3 9.1 7.4  NEUTROABS 5.8  --   --   HGB 15.8 15.9 15.9  HCT 43.6 44.9 44.8  MCV 85.0 86.2 87.0  PLT 244 237 227   Basic Metabolic Panel: Recent Labs  Lab 04/21/20 2301 04/22/20 0643 04/23/20 0434  NA 138 140 137  K 3.2* 3.7 3.5  CL 107 108 104  CO2 19* 21* 23  GLUCOSE 262* 143* 156*  BUN 13 11 14   CREATININE 0.87 0.78 0.85  CALCIUM 8.5* 8.6* 8.9  MG  --  2.0 2.0   GFR: Estimated Creatinine Clearance: 97.4 mL/min (by C-G formula based on SCr of 0.85 mg/dL). Liver Function Tests: Recent Labs  Lab 04/21/20 2301  AST 25  ALT 14  ALKPHOS 70  BILITOT 1.1  PROT 7.1  ALBUMIN 4.0   Recent Labs  Lab 04/21/20 1958  LIPASE 31   No results for input(s): AMMONIA in the last 168 hours. Coagulation Profile: Recent Labs  Lab 04/22/20 0058  INR 1.0   Cardiac Enzymes: No results for input(s): CKTOTAL, CKMB, CKMBINDEX, TROPONINI in the last 168 hours. BNP (last 3 results) No results for input(s): PROBNP in the last 8760 hours. HbA1C: No results for input(s): HGBA1C in the last 72 hours. CBG: No results for input(s): GLUCAP in the last 168 hours. Lipid Profile: No results for input(s): CHOL, HDL, LDLCALC, TRIG, CHOLHDL, LDLDIRECT in the last 72 hours. Thyroid Function Tests: No results for input(s): TSH, T4TOTAL, FREET4, T3FREE, THYROIDAB in the last 72 hours. Anemia Panel: No results for input(s): VITAMINB12, FOLATE, FERRITIN, TIBC, IRON, RETICCTPCT in the last 72 hours. Sepsis Labs: No results for input(s): PROCALCITON, LATICACIDVEN in the last 168 hours.  Recent Results (from the past 240 hour(s))  Respiratory Panel by RT PCR (Flu A&B, Covid) - Nasopharyngeal Swab     Status: None   Collection Time: 04/22/20 12:58 AM   Specimen: Nasopharyngeal Swab  Result Value Ref Range Status    SARS Coronavirus 2 by RT PCR NEGATIVE NEGATIVE Final    Comment: (NOTE) SARS-CoV-2 target nucleic acids are NOT DETECTED.  The SARS-CoV-2 RNA is generally detectable in upper respiratoy specimens during the acute phase of infection. The lowest concentration of SARS-CoV-2 viral copies this assay can detect is 131 copies/mL. A negative result does not preclude SARS-Cov-2 infection and should not be used as the sole basis for treatment or other patient management decisions. A negative result may occur with  improper specimen collection/handling, submission of specimen other than nasopharyngeal swab, presence of viral mutation(s) within the areas targeted by this assay, and inadequate number of viral copies (<131 copies/mL). A negative result must be combined with clinical observations, patient history, and epidemiological information. The expected result is Negative.  Fact Sheet for Patients:  04/24/20  Fact Sheet for  Healthcare Providers:  https://www.young.biz/  This test is no t yet approved or cleared by the [place] and  has been authorized for detection and/or diagnosis of SARS-CoV-2 by FDA under an Emergency Use Authorization (EUA). This EUA will remain  in effect (meaning this test can be used) for the duration of the COVID-19 declaration under Section 564(b)(1) of the Act, 21 U.S.C. section 360bbb-3(b)(1), unless the authorization is terminated or revoked sooner.     Influenza A by PCR NEGATIVE NEGATIVE Final   Influenza B by PCR NEGATIVE NEGATIVE Final    Comment: (NOTE) The Xpert Xpress SARS-CoV-2/FLU/RSV assay is intended as an aid in  the diagnosis of influenza from Nasopharyngeal swab specimens and  should not be used as a sole basis for treatment. Nasal washings and  aspirates are unacceptable for Xpert Xpress SARS-CoV-2/FLU/RSV  testing.  Fact Sheet for  Patients: https://www.moore.com/  Fact Sheet for Healthcare Providers: https://www.young.biz/  This test is not yet approved or cleared by the [place] FDA and  has been authorized for detection and/or diagnosis of SARS-CoV-2 by  FDA under an Emergency Use Authorization (EUA). This EUA will remain  in effect (meaning this test can be used) for the duration of the  Covid-19 declaration under Section 564(b)(1) of the Act, 21  U.S.C. section 360bbb-3(b)(1), unless the authorization is  terminated or revoked. Performed at [facility], [address] [place], [place] [address]          Radiology Studies: DG Chest 2 View  Result Date: 04/21/2020 CLINICAL DATA:  Chest pain radiating into the left arm EXAM: CHEST - 2 VIEW COMPARISON:  10/06/2019 FINDINGS: Cardiac shadow is within normal limits. Lungs are well aerated bilaterally. No focal infiltrate or sizable effusion is seen. Coronary stents are noted. No bony abnormality is seen. IMPRESSION: No acute abnormality noted. Electronically Signed   By: Alcide Clever [name]   On: 04/21/2020 23:28   ECHOCARDIOGRAM COMPLETE  Result Date: 04/22/2020    ECHOCARDIOGRAM REPORT   Patient Name:   Zidane Renner. Date of Exam: 04/22/2020 Medical Rec #:  [id number]         Height:       66.0 in Accession #:    [id number]        Weight:       245.0 lb Date of Birth:  [birth year]          BSA:          2.180 m Patient Age:    64 years          BP:           111/82 mmHg Patient Gender: M                 HR:           56 bpm. Exam Location:  [facility] Procedure: 2D Echo, Color Doppler, Cardiac Doppler and Intracardiac            Opacification Agent Indications:     I50.9 Congestive Heart Failure; I21.4 NSTEMI  History:         Patient has prior history of Echocardiogram examinations, most                  recent 10/07/2019. ICM, CAD, Stroke; Risk Factors:Sleep Apnea,                  Diabetes, Hypertension and  Dyslipidemia.  Sonographer:     Humphrey Rolls RDCS (  AE) Referring Phys:  [id number] Lennon Alstrom Diagnosing Phys: Yvonne Kendall MD  Sonographer Comments: Technically difficult study due to poor echo windows. Image acquisition challenging due to patient body habitus. IMPRESSIONS  1. Left ventricular ejection fraction, by estimation, is 25 to 30%. The left ventricle has severely decreased function. The left ventricle demonstrates global hypokinesis. The left ventricular internal cavity size was moderately dilated. Left ventricular diastolic parameters are consistent with Grade II diastolic dysfunction (pseudonormalization). Elevated left atrial pressure. There is akinesis of the left ventricular, entire inferior wall and inferolateral wall.  2. Right ventricular systolic function is mildly reduced. The right ventricular size is normal.  3. Left atrial size was mildly dilated.  4. Right atrial size was mildly dilated.  5. The mitral valve is grossly normal. Mild to moderate mitral valve regurgitation. No evidence of mitral stenosis.  6. The aortic valve has an indeterminant number of cusps. There is mild thickening of the aortic valve. Aortic valve regurgitation is not visualized. No aortic stenosis is present.  7. Aortic dilatation noted. There is borderline dilatation of the aortic root, measuring 38 mm. FINDINGS  Left Ventricle: Left ventricular ejection fraction, by estimation, is 25 to 30%. The left ventricle has severely decreased function. The left ventricle demonstrates global hypokinesis. Definity contrast agent was given IV to delineate the left ventricular endocardial borders. The left ventricular internal cavity size was moderately dilated. There is no left ventricular hypertrophy. Left ventricular diastolic parameters are consistent with Grade II diastolic dysfunction (pseudonormalization). Elevated left atrial pressure. Right Ventricle: The right ventricular size is normal. No increase in right  ventricular wall thickness. Right ventricular systolic function is mildly reduced. Left Atrium: Left atrial size was mildly dilated. Right Atrium: Right atrial size was mildly dilated. Pericardium: There is no evidence of pericardial effusion. Mitral Valve: The mitral valve is grossly normal. Mild to moderate mitral valve regurgitation. No evidence of mitral valve stenosis. MV peak gradient, 3.4 mmHg. The mean mitral valve gradient is 1.0 mmHg. Tricuspid Valve: The tricuspid valve is not well visualized. Tricuspid valve regurgitation is trivial. Aortic Valve: The aortic valve has an indeterminant number of cusps. There is mild thickening of the aortic valve. Aortic valve regurgitation is not visualized. No aortic stenosis is present. Aortic valve mean gradient measures 2.0 mmHg. Aortic valve peak gradient measures 5.0 mmHg. Aortic valve area, by VTI measures 3.32 cm. Pulmonic Valve: The pulmonic valve was not well visualized. Pulmonic valve regurgitation is not visualized. No evidence of pulmonic stenosis. Aorta: Aortic dilatation noted. There is borderline dilatation of the aortic root, measuring 38 mm. Pulmonary Artery: The pulmonary artery is not well seen. Venous: The inferior vena cava was not well visualized. IAS/Shunts: The interatrial septum was not well visualized.  LEFT VENTRICLE PLAX 2D LVIDd:         6.08 cm  Diastology LVIDs:         5.02 cm  LV e' medial:    3.48 cm/s LV PW:         0.87 cm  LV E/e' medial:  21.0 LV IVS:        0.96 cm  LV e' lateral:   7.62 cm/s LVOT diam:     2.30 cm  LV E/e' lateral: 9.6 LV SV:         66 LV SV Index:   30 LVOT Area:     4.15 cm  LEFT ATRIUM           Index  RIGHT ATRIUM           Index LA diam:      3.60 cm 1.65 cm/m  RA Area:     21.07 cm 9.67 cm/m LA Vol (A2C): 51.7 ml 23.72 ml/m LA Vol (A4C): 73.2 ml 33.58 ml/m  AORTIC VALVE                   PULMONIC VALVE AV Area (Vmax):    3.12 cm    PV Vmax:       0.82 m/s AV Area (Vmean):   3.39 cm    PV  Vmean:      57.100 cm/s AV Area (VTI):     3.32 cm    PV VTI:        0.181 m AV Vmax:           112.00 cm/s PV Peak grad:  2.7 mmHg AV Vmean:          71.100 cm/s PV Mean grad:  1.0 mmHg AV VTI:            0.200 m AV Peak Grad:      5.0 mmHg AV Mean Grad:      2.0 mmHg LVOT Vmax:         84.20 cm/s LVOT Vmean:        58.000 cm/s LVOT VTI:          0.160 m LVOT/AV VTI ratio: 0.80  AORTA Ao Root diam: 3.80 cm MITRAL VALVE MV Area (PHT): 3.27 cm    SHUNTS MV Peak grad:  3.4 mmHg    Systemic VTI:  0.16 m MV Mean grad:  1.0 mmHg    Systemic Diam: 2.30 cm MV Vmax:       0.92 m/s MV Vmean:      54.0 cm/s MV Decel Time: 232 msec MV E velocity: 73.20 cm/s MV A velocity: 54.80 cm/s MV E/A ratio:  1.34 Cristal Deer End MD Electronically signed by Yvonne Kendall MD Signature Date/Time: 04/22/2020/1:52:47 PM    Final         Scheduled Meds: . aspirin EC  81 mg Oral Daily  . carvedilol  3.125 mg Oral BID WC  . clopidogrel  75 mg Oral Daily  . ezetimibe  10 mg Oral Daily  . furosemide  40 mg Oral Daily  . influenza vac split quadrivalent PF  0.5 mL Intramuscular Tomorrow-1000  . isosorbide mononitrate  15 mg Oral Daily  . loratadine  10 mg Oral Daily   Continuous Infusions: . heparin 1,600 Units/hr (04/23/20 6269)     LOS: 1 day    Time spent: 28 minutes    Marrion Coy, MD [facility]   To contact the attending provider between 7A-7P or the covering provider during after hours 7P-7A, please log into the web site [email or web address] and access using universal [place] password for that web site. If you do not have the password, please call the hospital operator.  04/23/2020, 1:05 PM

## 2020-04-23 NOTE — Progress Notes (Signed)
Informed pt that he is currently NPO until cardiology see him. Pt stated the he understands.

## 2020-04-23 NOTE — Plan of Care (Signed)
  Problem: Education: Goal: Knowledge of General Education information will improve Description: Including pain rating scale, medication(s)/side effects and non-pharmacologic comfort measures Outcome: Progressing   Problem: Health Behavior/Discharge Planning: Goal: Ability to manage health-related needs will improve Outcome: Progressing   Problem: Clinical Measurements: Goal: Ability to maintain clinical measurements within normal limits will improve Outcome: Progressing Goal: Will remain free from infection Outcome: Progressing Goal: Diagnostic test results will improve Outcome: Progressing Goal: Cardiovascular complication will be avoided Outcome: Progressing   Problem: Pain Managment: Goal: General experience of comfort will improve Outcome: Progressing   Problem: Education: Goal: Understanding of cardiac disease, CV risk reduction, and recovery process will improve Outcome: Progressing Goal: Understanding of medication regimen will improve Outcome: Progressing Goal: Individualized Educational Video(s) Outcome: Progressing   Problem: Cardiac: Goal: Ability to achieve and maintain adequate cardiopulmonary perfusion will improve Outcome: Progressing Goal: Vascular access site(s) Level 0-1 will be maintained Outcome: Progressing

## 2020-04-23 NOTE — Progress Notes (Signed)
ANTICOAGULATION CONSULT NOTE   Pharmacy Consult for Heparin  Indication: chest pain/ACS  Allergies  Allergen Reactions  . Contrast Media [Iodinated Diagnostic Agents] Shortness Of Breath  . Iohexol Shortness Of Breath     Onset Date: [id number]   . Atorvastatin Other (See Comments)    Myalgias   . Hydrocodone Itching  . Morphine And Related Other (See Comments)    Lost control   . Penicillins Other (See Comments)    Unknown reaction Did it involve swelling of the face/tongue/throat, SOB, or low BP? Unknown Did it involve sudden or severe rash/hives, skin peeling, or any reaction on the inside of your mouth or nose? Unknown Did you need to seek medical attention at a hospital or doctor's office? Yes When did it last happen?Childhood allergy  If all above answers are "NO", may proceed with cephalosporin use.   . Rosuvastatin Other (See Comments)    Myalgias     Patient Measurements: Height: 5\' 6"  (167.6 cm) Weight: 97.8 kg (215 lb 9.6 oz) IBW/kg (Calculated) : 63.8 Heparin Dosing Weight: 89.2 kg   Vital Signs: Temp: 97.6 F (36.4 C) (10/20 0450) Temp Source: Oral (10/20 0450) BP: 129/69 (10/20 0450) Pulse Rate: 58 (10/20 0450)  Labs: Recent Labs    04/21/20 2301 04/21/20 2301 04/22/20 0058 04/22/20 0058 04/22/20 0643 04/22/20 1524 04/22/20 1912 04/22/20 2222 04/23/20 0434  HGB 15.8   < >  --   --   --   --  15.9  --  15.9  HCT 43.6  --   --   --   --   --  44.9  --  44.8  PLT 244  --   --   --   --   --  237  --  227  APTT  --   --  29  --   --   --   --   --   --   LABPROT  --   --  12.9  --   --   --   --   --   --   INR  --   --  1.0  --   --   --   --   --   --   HEPARINUNFRC  --   --   --   --  0.18*  --  0.30  --  0.36  CREATININE 0.87  --   --   --  0.78  --   --   --  0.85  TROPONINIHS 630*   < > 599*   < > 1,609* 1,606*  --  1,362*  --    < > = values in this interval not displayed.    Estimated Creatinine Clearance: 97.4 mL/min (by C-G  formula based on SCr of 0.85 mg/dL).   Medical History: Past Medical History:  Diagnosis Date  . Anginal pain (HCC)   . Arthritis   . CAD (coronary artery disease)    a. s/p MI with LAD and Diag stenting @ [name];  b. 06/2015 Cath: LAD 47m/d ISR, 100 RCA (ISR) w/ L->R collats, otw mod nonobs dzs-->Med Rx; c. 08/2019 Cath: LM nl, LAD 10p/m ISR, D1 20, D2 100, RI min irregs, LCX 81m/d, OM1 100, OM2 50, RCA 100p, 70d. RPDA fills via collats from LAD. EF 25-35%-->Med Rx; d. 10/2019 NSTEMI/Cath: LCX now 100 (2.75x15 Resolute Onyx DES), otw stable compared to 08/2019.  09/2019 Chronic combined systolic and diastolic CHF (congestive heart failure) (HCC)  a. 06/2015 Echo: EF 20-25%, Gr3 DD; b. 10/2019 Echo: EF 25-30%, glob HK, sev inf/infapical HK. Mod dil LA.  [name] Dyspnea   . Essential hypertension   . Hyperlipidemia   . Hypokalemia    a. 06/2015 in setting of diuresis.  . Ischemic cardiomyopathy    a. 2011 EF 45% ([name]);  b. 06/2015 Echo: EF 20-25%; c. 04/2019 Echo: EF 40-45%; d. 08/2019 LV gram: EF 25-35%; e. 10/2019 Echo: EF 25-30%.  . PVC's (premature ventricular contractions)    a. 09/2019 [facility] (3 days): Avg hr 70, 4 runs NSVT, 5 runs SVT, rare PACs, frequent PVCs w/ 11.8% burden.  . Sleep apnea   . Stroke/Right temporal lobe infarction [facility])    a. 10/2019 MRI brain: 1cm acute ischemic nonhemorrhagic R temporal lobe infarct. Age-related cerebral atrophy w/ moderate chronic small vessel ischemic dzs.  . Type 2 diabetes mellitus with hyperglycemia (HCC) 10/08/2019    Medications:  Medications Prior to Admission  Medication Sig Dispense Refill Last Dose  . acetaminophen (TYLENOL) 500 MG tablet Take 1,000 mg by mouth at bedtime as needed. At bedtime and as needed.   prn at prn  . Clotrimazole (JOCK ITCH RELIEF EX) Apply 1 application topically daily as needed (jock itch).   prn at prn  . colchicine 0.6 MG tablet Take 1 tablet (0.6 mg total) by mouth daily as needed (gout). 10 tablet 3 prn at prn  .  furosemide (LASIX) 40 MG tablet Take 1 tablet (40 mg total) by mouth daily as needed. (Patient taking differently: Take 40 mg by mouth daily as needed for fluid. ) 60 tablet 0 prn at prn  . lip balm (BLISTEX) OINT Apply 1 application topically as needed for lip care.     . nitroGLYCERIN (NITROSTAT) 0.4 MG SL tablet Place 1 tablet (0.4 mg total) under the tongue every 5 (five) minutes as needed for chest pain. 30 tablet 0 04/21/2020 at 2230  . sacubitril-valsartan (ENTRESTO) 49-51 MG Take 1 tablet by mouth 2 (two) times daily.   04/21/2020 at 2200  . aspirin 81 MG chewable tablet Chew 1 tablet (81 mg total) by mouth daily. (Patient not taking: Reported on 11/14/2019) 30 tablet 0 Not Taking at Unknown time  . clopidogrel (PLAVIX) 75 MG tablet Take 1 tablet (75 mg total) by mouth daily. (Patient not taking: Reported on 11/14/2019) 30 tablet 0 Not Taking at Unknown time    Assessment: Pharmacy consulted to dose heparin in this 64 year old male admitted with ACS/NSTEMI.  No prior anticoag noted.  CrCl = 101.7 ml/min   10/19 Heparin infusion started @ 1200 units/hr  10/19 @ 0643 HL 0.18 10/19 @ 1912 HL 0.3  Goal of Therapy:  Heparin level 0.3-0.7 units/ml Monitor platelets by anticoagulation protocol: Yes   Plan:  Heparin level is therapeutic but on the lower end of goal. Will increase heparin infusion to 1600 units/hr. Recheck heparin level in 6 hours. CBC daily while on heparin.   10/20:  HL @ 0434 = 0.36 HL therapeutic X 2.  Will recheck HL on 10/21 with AM labs.   Scherrie Gerlach, PharmD Clinical Pharmacist 04/23/2020 5:25 AM

## 2020-04-24 LAB — BASIC METABOLIC PANEL
Anion gap: 12 (ref 5–15)
BUN: 18 mg/dL (ref 8–23)
CO2: 23 mmol/L (ref 22–32)
Calcium: 9.5 mg/dL (ref 8.9–10.3)
Chloride: 105 mmol/L (ref 98–111)
Creatinine, Ser: 0.99 mg/dL (ref 0.61–1.24)
GFR, Estimated: >60 mL/min (ref >60)
Glucose, Bld: 211 mg/dL — ABNORMAL HIGH (ref 70–99)
Potassium: 3.8 mmol/L (ref 3.5–5.1)
Sodium: 140 mmol/L (ref 135–145)

## 2020-04-24 LAB — MAGNESIUM: Magnesium: 2.3 mg/dL (ref 1.7–2.4)

## 2020-04-24 LAB — HEPARIN LEVEL (UNFRACTIONATED): Heparin Unfractionated: 0.26 IU/mL — ABNORMAL LOW (ref 0.30–0.70)

## 2020-04-24 MED ORDER — ISOSORBIDE MONONITRATE ER 30 MG PO TB24
15.0000 mg | ORAL_TABLET | Freq: Every day | ORAL | 0 refills | Status: DC
Start: 2020-04-25 — End: 2020-07-07

## 2020-04-24 MED ORDER — CARVEDILOL 3.125 MG PO TABS
3.1250 mg | ORAL_TABLET | Freq: Two times a day (BID) | ORAL | 0 refills | Status: DC
Start: 2020-04-24 — End: 2020-07-07

## 2020-04-24 MED ORDER — EZETIMIBE 10 MG PO TABS
10.0000 mg | ORAL_TABLET | Freq: Every day | ORAL | 0 refills | Status: DC
Start: 2020-04-25 — End: 2020-04-24

## 2020-04-24 MED ORDER — EZETIMIBE 10 MG PO TABS
10.0000 mg | ORAL_TABLET | Freq: Every day | ORAL | 0 refills | Status: DC
Start: 2020-04-25 — End: 2021-06-19

## 2020-04-24 MED ORDER — HEPARIN BOLUS VIA INFUSION
1300.0000 [IU] | Freq: Once | INTRAVENOUS | Status: AC
  Administered 2020-04-24: 1300 [IU] via INTRAVENOUS
  Filled 2020-04-24: qty 1300

## 2020-04-24 MED ORDER — ISOSORBIDE MONONITRATE ER 30 MG PO TB24
15.0000 mg | ORAL_TABLET | Freq: Every day | ORAL | 0 refills | Status: DC
Start: 2020-04-25 — End: 2020-04-24

## 2020-04-24 MED ORDER — CARVEDILOL 3.125 MG PO TABS
3.1250 mg | ORAL_TABLET | Freq: Two times a day (BID) | ORAL | 0 refills | Status: DC
Start: 2020-04-24 — End: 2020-04-24

## 2020-04-24 NOTE — Progress Notes (Signed)
Pt refused to be wheeled downstairs and walked out on his own. This RN did go over his D/C paperwork and pt verbalized understanding. Pt left at 1030.

## 2020-04-24 NOTE — Progress Notes (Signed)
     Dr. Mariah Milling and I entered the patient's room this morning for rounds prior to him leaving. He has a discharge order in and had been discharged. The patient was dressed and leaving the room. He refused cardiology rounding/exam and exited the floor with staff. Recommend in follow up to titrate GDMT as able.

## 2020-04-24 NOTE — Progress Notes (Signed)
Pt took telemetry monitor off and is refusing to wear it stating, "I'm getting out of here in a few minutes." CCMD and MD made aware. Will continue to monitor.

## 2020-04-24 NOTE — Discharge Summary (Signed)
Physician Discharge Summary  Patient ID: [name]. MRN: [id number] DOB/AGE: 63/20/1957 63 y.o.  Admit date: 04/22/2020 Discharge date: 04/24/2020  Admission Diagnoses:  Discharge Diagnoses:  Principal Problem:   NSTEMI (non-ST elevated myocardial infarction) [facility]) Active Problems:   CAD (coronary artery disease)   Essential hypertension   Chronic systolic heart failure (HCC)   Type 2 diabetes mellitus with hyperglycemia [facility])   Discharged Condition: good  Hospital Course: Glen Jamesis a 64 y.o.malewith medical history significant forCAD with history of MI x3, admitted in April 2021 with NSTEMI receiving DES to left circumflex, as well as history of HFrEF, EF 25 to 30% secondary to ischemic cardiomyopathy, HTN and HLD who presents to the emergency room with retrosternal chest pain radiating to the back throat and arm of severe intensity similar to prior heart attacks, associated with nausea and diaphoresis. He denies shortness of breath, cough fever or chills, lower extremity pain or swelling. Took nitroglycerin with minimal improvement. ED Course:On arrival, vitals unremarkable, EKG with no acute ST-T wave changes on normal sinus rhythm, troponin VI 130. Blood work otherwise unremarkable except for elevated blood sugar of 262. Chest x-ray with no acute disease. Patient started on a heparin infusion.  Patient is seen by cardiology, decided to treat medically.  Continue heparin for total course of 48 hours.  #1.  Non-ST elevation myocardial infarction. Patient has been seen by cardiology, deemed to be not a candidate for intervention.    Medication adjusted as per cardiology. Patient refused to follow-up with cardiology in [place], he states that he will call himself to see a cardiologist in [facility]. He did currently does not have a primary care physician, he refused my offer to set up a PCP in [place].  2.  Chronic systolic congestive heart  failure. No exacerbation.  3.  Essential hypertension.  Continue home medicines.  4.  Hypokalemia. Resolved.  5.  Type 2 diabetes uncontrolled with hyperglycemia. Continue current regimen   Consults: cardiology  Significant Diagnostic Studies:  Echo: 1. Left ventricular ejection fraction, by estimation, is 25 to 30%. The left ventricle has severely decreased function. The left ventricle demonstrates global hypokinesis. The left ventricular internal cavity size was moderately dilated. Left ventricular diastolic parameters are consistent with Grade II diastolic dysfunction (pseudonormalization). Elevated left atrial pressure. There is akinesis of the left ventricular, entire inferior wall and inferolateral wall. 2. Right ventricular systolic function is mildly reduced. The right ventricular size is normal. 3. Left atrial size was mildly dilated. 4. Right atrial size was mildly dilated. 5. The mitral valve is grossly normal. Mild to moderate mitral valve regurgitation. No evidence of mitral stenosis. 6. The aortic valve has an indeterminant number of cusps. There is mild thickening of the aortic valve. Aortic valve regurgitation is not visualized. No aortic stenosis is present. 7. Aortic dilatation noted. There is borderline dilatation of the aortic root, measuring 38 mm.    Treatments: Heparin drip  Discharge Exam: Blood pressure 136/62, pulse 61, temperature 98.1 F (36.7 C), resp. rate 19, height 5\' 6"  (1.676 m), weight 98.4 kg, SpO2 93 %. General appearance: alert and cooperative Resp: clear to auscultation bilaterally Cardio: regular rate and rhythm, S1, S2 normal, no murmur, click, rub or gallop GI: soft, non-tender; bowel sounds normal; no masses,  no organomegaly Extremities: extremities normal, atraumatic, no cyanosis or edema  Disposition: Discharge disposition: 01-Home or Self Care       Discharge Instructions    Diet - low sodium  heart healthy   Complete  by: As directed    Increase activity slowly   Complete by: As directed      Allergies as of 04/24/2020      Reactions   Contrast Media [iodinated Diagnostic Agents] Shortness Of Breath   Iohexol Shortness Of Breath    Onset Date: [id number]   Atorvastatin Other (See Comments)   Myalgias   Hydrocodone Itching   Morphine And Related Other (See Comments)   Lost control    Penicillins Other (See Comments)   Unknown reaction Did it involve swelling of the face/tongue/throat, SOB, or low BP? Unknown Did it involve sudden or severe rash/hives, skin peeling, or any reaction on the inside of your mouth or nose? Unknown Did you need to seek medical attention at a hospital or doctor's office? Yes When did it last happen?Childhood allergy  If all above answers are "NO", may proceed with cephalosporin use.   Rosuvastatin Other (See Comments)   Myalgias      Medication List    TAKE these medications   acetaminophen 500 MG tablet Commonly known as: TYLENOL Take 1,000 mg by mouth at bedtime as needed. At bedtime and as needed.   aspirin 81 MG chewable tablet Chew 1 tablet (81 mg total) by mouth daily.   carvedilol 3.125 MG tablet Commonly known as: COREG Take 1 tablet (3.125 mg total) by mouth 2 (two) times daily with a meal.   clopidogrel 75 MG tablet Commonly known as: PLAVIX Take 1 tablet (75 mg total) by mouth daily.   colchicine 0.6 MG tablet Take 1 tablet (0.6 mg total) by mouth daily as needed (gout).   ezetimibe 10 MG tablet Commonly known as: ZETIA Take 1 tablet (10 mg total) by mouth daily. Start taking on: April 25, 2020   furosemide 40 MG tablet Commonly known as: LASIX Take 1 tablet (40 mg total) by mouth daily as needed. What changed: reasons to take this   isosorbide mononitrate 30 MG 24 hr tablet Commonly known as: IMDUR Take 0.5 tablets (15 mg total) by mouth daily. Start taking on: April 25, 2020   [facility] RELIEF EX Apply 1 application  topically daily as needed (jock itch).   lip balm Oint Apply 1 application topically as needed for lip care.   nitroGLYCERIN 0.4 MG SL tablet Commonly known as: NITROSTAT Place 1 tablet (0.4 mg total) under the tongue every 5 (five) minutes as needed for chest pain.   sacubitril-valsartan 49-51 MG Commonly known as: ENTRESTO Take 1 tablet by mouth 2 (two) times daily.        Signed: Marrion Coy 04/24/2020, 9:39 AM

## 2020-04-24 NOTE — Progress Notes (Signed)
ANTICOAGULATION CONSULT NOTE   Pharmacy Consult for Heparin  Indication: chest pain/ACS  Allergies  Allergen Reactions  . Contrast Media [Iodinated Diagnostic Agents] Shortness Of Breath  . Iohexol Shortness Of Breath     Onset Date: [id number]   . Atorvastatin Other (See Comments)    Myalgias   . Hydrocodone Itching  . Morphine And Related Other (See Comments)    Lost control   . Penicillins Other (See Comments)    Unknown reaction Did it involve swelling of the face/tongue/throat, SOB, or low BP? Unknown Did it involve sudden or severe rash/hives, skin peeling, or any reaction on the inside of your mouth or nose? Unknown Did you need to seek medical attention at a hospital or doctor's office? Yes When did it last happen?Childhood allergy  If all above answers are "NO", may proceed with cephalosporin use.   . Rosuvastatin Other (See Comments)    Myalgias     Patient Measurements: Height: 5\' 6"  (167.6 cm) Weight: 98.4 kg (216 lb 14.4 oz) IBW/kg (Calculated) : 63.8 Heparin Dosing Weight: 89.2 kg   Vital Signs: Temp: 97.9 F (36.6 C) (10/21 0443) Temp Source: Oral (10/21 0443) BP: 160/88 (10/21 0443) Pulse Rate: 58 (10/21 0443)  Labs: Recent Labs     0000 04/21/20 2301 04/21/20 2301 04/22/20 0058 04/22/20 04/24/20 04/22/20 04/24/20 04/22/20 1524 04/22/20 1912 04/22/20 2222 04/23/20 0434 04/24/20 0459  HGB   < > 15.8  --   --   --   --   --  15.9  --  15.9  --   HCT  --  43.6  --   --   --   --   --  44.9  --  44.8  --   PLT  --  244  --   --   --   --   --  237  --  227  --   APTT  --   --   --  29  --   --   --   --   --   --   --   LABPROT  --   --   --  12.9  --   --   --   --   --   --   --   INR  --   --   --  1.0  --   --   --   --   --   --   --   HEPARINUNFRC  --   --   --   --  0.18*   < >  --  0.30  --  0.36 0.26*  CREATININE  --  0.87   < >  --  0.78  --   --   --   --  0.85 0.99  TROPONINIHS  --  630*   < > 599* 1,609*  --  1,606*  --  1,362*  --    --    < > = values in this interval not displayed.    Estimated Creatinine Clearance: 83.8 mL/min (by C-G formula based on SCr of 0.99 mg/dL).   Medical History: Past Medical History:  Diagnosis Date  . Anginal pain (HCC)   . Arthritis   . CAD (coronary artery disease)    a. s/p MI with LAD and Diag stenting @ [name];  b. 06/2015 Cath: LAD 80m/d ISR, 100 RCA (ISR) w/ L->R collats, otw mod nonobs dzs-->Med Rx; c. 08/2019 Cath: LM nl, LAD  10p/m ISR, D1 20, D2 100, RI min irregs, LCX 69m/d, OM1 100, OM2 50, RCA 100p, 70d. RPDA fills via collats from LAD. EF 25-35%-->Med Rx; d. 10/2019 NSTEMI/Cath: LCX now 100 (2.75x15 Resolute Onyx DES), otw stable compared to 08/2019.  [name] Chronic combined systolic and diastolic CHF (congestive heart failure) (HCC)    a. 06/2015 Echo: EF 20-25%, Gr3 DD; b. 10/2019 Echo: EF 25-30%, glob HK, sev inf/infapical HK. Mod dil LA.  [name] Dyspnea   . Essential hypertension   . Hyperlipidemia   . Hypokalemia    a. 06/2015 in setting of diuresis.  . Ischemic cardiomyopathy    a. 2011 EF 45% ([name]);  b. 06/2015 Echo: EF 20-25%; c. 04/2019 Echo: EF 40-45%; d. 08/2019 LV gram: EF 25-35%; e. 10/2019 Echo: EF 25-30%.  . PVC's (premature ventricular contractions)    a. 09/2019 Zio (3 days): Avg hr 70, 4 runs NSVT, 5 runs SVT, rare PACs, frequent PVCs w/ 11.8% burden.  . Sleep apnea   . Stroke/Right temporal lobe infarction [facility])    a. 10/2019 MRI brain: 1cm acute ischemic nonhemorrhagic R temporal lobe infarct. Age-related cerebral atrophy w/ moderate chronic small vessel ischemic dzs.  . Type 2 diabetes mellitus with hyperglycemia (HCC) 10/08/2019    Medications:  Medications Prior to Admission  Medication Sig Dispense Refill Last Dose  . acetaminophen (TYLENOL) 500 MG tablet Take 1,000 mg by mouth at bedtime as needed. At bedtime and as needed.   prn at prn  . Clotrimazole (JOCK ITCH RELIEF EX) Apply 1 application topically daily as needed (jock itch).   prn at prn  . colchicine  0.6 MG tablet Take 1 tablet (0.6 mg total) by mouth daily as needed (gout). 10 tablet 3 prn at prn  . furosemide (LASIX) 40 MG tablet Take 1 tablet (40 mg total) by mouth daily as needed. (Patient taking differently: Take 40 mg by mouth daily as needed for fluid. ) 60 tablet 0 prn at prn  . lip balm (BLISTEX) OINT Apply 1 application topically as needed for lip care.     . nitroGLYCERIN (NITROSTAT) 0.4 MG SL tablet Place 1 tablet (0.4 mg total) under the tongue every 5 (five) minutes as needed for chest pain. 30 tablet 0 04/21/2020 at 2230  . sacubitril-valsartan (ENTRESTO) 49-51 MG Take 1 tablet by mouth 2 (two) times daily.   04/21/2020 at 2200  . aspirin 81 MG chewable tablet Chew 1 tablet (81 mg total) by mouth daily. (Patient not taking: Reported on 11/14/2019) 30 tablet 0 Not Taking at Unknown time  . clopidogrel (PLAVIX) 75 MG tablet Take 1 tablet (75 mg total) by mouth daily. (Patient not taking: Reported on 11/14/2019) 30 tablet 0 Not Taking at Unknown time    Assessment: Pharmacy consulted to dose heparin in this 64 year old male admitted with ACS/NSTEMI.  No prior anticoag noted.  CrCl = 101.7 ml/min   10/19 Heparin infusion started @ 1200 units/hr  10/19 @ 0643 HL 0.18 10/19 @ 1912 HL 0.3  Goal of Therapy:  Heparin level 0.3-0.7 units/ml Monitor platelets by anticoagulation protocol: Yes   Plan:  Heparin level is therapeutic but on the lower end of goal. Will increase heparin infusion to 1600 units/hr. Recheck heparin level in 6 hours. CBC daily while on heparin.   10/20:  HL @ 0434 = 0.36 HL therapeutic X 2.  Will recheck HL on 10/21 with AM labs.   10/21:  HL @ 0459 = 0.26 Will order Heparin 1300  units IV X 1 and increase drip rate to 1800 units/hr.  Will recheck HL 6 hrs after rate change.   Scherrie Gerlach, PharmD Clinical Pharmacist 04/24/2020 6:44 AM

## 2020-05-21 ENCOUNTER — Telehealth: Payer: Self-pay | Admitting: Family

## 2020-05-21 NOTE — Telephone Encounter (Signed)
Notified patient that we need him to come in to sign paperwork to renew his [employer] patient assistance for entresto.     Egidio Lofgren, NT

## 2020-07-03 ENCOUNTER — Telehealth: Payer: Self-pay | Admitting: Family

## 2020-07-03 NOTE — Telephone Encounter (Signed)
Spoke to patient to notify him his [employer] application is up for renewal.    Joice Lofts, NT

## 2020-07-07 ENCOUNTER — Ambulatory Visit: Payer: Self-pay | Attending: Family | Admitting: Family

## 2020-07-07 ENCOUNTER — Encounter: Payer: Self-pay | Admitting: Family

## 2020-07-07 ENCOUNTER — Other Ambulatory Visit: Payer: Self-pay

## 2020-07-07 VITALS — BP 174/88 | HR 71 | Resp 18 | Ht 66.0 in | Wt 220.0 lb

## 2020-07-07 DIAGNOSIS — I5042 Chronic combined systolic (congestive) and diastolic (congestive) heart failure: Secondary | ICD-10-CM | POA: Insufficient documentation

## 2020-07-07 DIAGNOSIS — Z955 Presence of coronary angioplasty implant and graft: Secondary | ICD-10-CM | POA: Insufficient documentation

## 2020-07-07 DIAGNOSIS — I252 Old myocardial infarction: Secondary | ICD-10-CM | POA: Diagnosis not present

## 2020-07-07 DIAGNOSIS — I5022 Chronic systolic (congestive) heart failure: Secondary | ICD-10-CM

## 2020-07-07 DIAGNOSIS — Z8249 Family history of ischemic heart disease and other diseases of the circulatory system: Secondary | ICD-10-CM | POA: Diagnosis not present

## 2020-07-07 DIAGNOSIS — Z87891 Personal history of nicotine dependence: Secondary | ICD-10-CM | POA: Diagnosis not present

## 2020-07-07 DIAGNOSIS — Z79899 Other long term (current) drug therapy: Secondary | ICD-10-CM | POA: Insufficient documentation

## 2020-07-07 DIAGNOSIS — I10 Essential (primary) hypertension: Secondary | ICD-10-CM

## 2020-07-07 DIAGNOSIS — E785 Hyperlipidemia, unspecified: Secondary | ICD-10-CM | POA: Insufficient documentation

## 2020-07-07 DIAGNOSIS — I110 Hypertensive heart disease with heart failure: Secondary | ICD-10-CM | POA: Insufficient documentation

## 2020-07-07 DIAGNOSIS — Z7902 Long term (current) use of antithrombotics/antiplatelets: Secondary | ICD-10-CM | POA: Diagnosis not present

## 2020-07-07 DIAGNOSIS — Z7982 Long term (current) use of aspirin: Secondary | ICD-10-CM | POA: Insufficient documentation

## 2020-07-07 DIAGNOSIS — I255 Ischemic cardiomyopathy: Secondary | ICD-10-CM | POA: Insufficient documentation

## 2020-07-07 DIAGNOSIS — I2510 Atherosclerotic heart disease of native coronary artery without angina pectoris: Secondary | ICD-10-CM | POA: Diagnosis not present

## 2020-07-07 DIAGNOSIS — I509 Heart failure, unspecified: Secondary | ICD-10-CM | POA: Diagnosis present

## 2020-07-07 MED ORDER — CARVEDILOL 3.125 MG PO TABS
3.1250 mg | ORAL_TABLET | Freq: Two times a day (BID) | ORAL | 3 refills | Status: DC
Start: 2020-07-07 — End: 2021-06-19

## 2020-07-07 MED ORDER — ISOSORBIDE MONONITRATE ER 30 MG PO TB24
15.0000 mg | ORAL_TABLET | Freq: Every day | ORAL | 3 refills | Status: DC
Start: 2020-07-07 — End: 2021-06-19

## 2020-07-07 MED ORDER — CLOPIDOGREL BISULFATE 75 MG PO TABS
75.0000 mg | ORAL_TABLET | Freq: Every day | ORAL | 3 refills | Status: DC
Start: 2020-07-07 — End: 2021-06-19

## 2020-07-07 MED ORDER — FUROSEMIDE 40 MG PO TABS
40.0000 mg | ORAL_TABLET | Freq: Every day | ORAL | 3 refills | Status: DC | PRN
Start: 2020-07-07 — End: 2021-06-19

## 2020-07-07 NOTE — Progress Notes (Signed)
Patient ID: Glen Flemings., male    DOB: 04/17/1956, 65 y.o.   MRN: [id number]  HPI  [name] is a 65 y/o male with a history of CAD, HTN, hyperlipidemia, hypokalemia, previous tobacco use and chronic heart failure.   Echo report from 04/22/20 reviewed and showed an EF of 25-30% along with mild/moderate Glen. Echo report from 10/07/19 reviewed and showed an EF of 25-30%. Echo report from 05/04/2019 reviewed and showed an EF of 40-45% along with mild Glen. Echo report from 06/07/15 reviewed and showed an EF of 20-25% along with moderate Glen and mild TR.   Catheterization done 10/08/19 showed:  1st Diag lesion is 60% stenosed.  Prox LAD to Mid LAD lesion is 10% stenosed.  2nd Diag lesion is 100% stenosed.  1st Mrg lesion is 100% stenosed.  Mid Cx to Dist Cx lesion is 100% stenosed.  Prox RCA to Mid RCA lesion is 100% stenosed.  Dist RCA lesion is 70% stenosed.  2nd Mrg lesion is 60% stenosed.  Post intervention, there is a 0% residual stenosis.  A drug-eluting stent was successfully placed using a STENT RESOLUTE ONYX [id number].   1. Significant underlying three-vessel coronary artery disease with patent stents in the LAD and diagonal with moderate in-stent restenosis in the diagonal stent. Chronically occluded RCA stents with right to right bridging and left-to-right collaterals. Diffuse small vessel disease.   The mid/distal left circumflex which was significantly diseased recently is now completely occluded which is the likely culprit for myocardial infarction.  This supplies relatively small size OM 3 which has moderate diffuse atherosclerosis. 2.  Left ventricular angiography was not performed.  EF was 25 to 30% by echo. 3.  Severely elevated left ventricular end-diastolic pressure at 33 mmHg 4.  Successful angioplasty and drug-eluting stent placement to the left circumflex.  Catheterization done 08/20/19 showed:  Prox RCA to Mid RCA lesion is 100% stenosed.  Dist RCA lesion is 70%  stenosed.  There is moderate to severe left ventricular systolic dysfunction.  LV end diastolic pressure is normal.  The left ventricular ejection fraction is 25-35% by visual estimate.  1st Mrg lesion is 100% stenosed.  2nd Mrg lesion is 50% stenosed.  Prox LAD to Mid LAD lesion is 10% stenosed.  1st Diag lesion is 20% stenosed.  2nd Diag lesion is 100% stenosed.  Mid Cx to Dist Cx lesion is 80% stenosed.   1.  Significant underlying three-vessel coronary artery disease with patent stents in the LAD and diagonal without significant restenosis.  Chronically occluded RCA stents with right to right bridging and left-to-right collaterals.  Diffuse small vessel disease.  Distal left circumflex stenosis seems worse than 2016 but this supplies relatively small size OM 3 which has moderate diffuse atherosclerosis. 2.  Moderately to severely reduced LV systolic function with an EF of 25 to 35%. 3.  Right heart catheterization showed normal filling pressures, mild pulmonary hypertension and normal cardiac output. 4. Recommend aggressive treatment of heart failure and up titration of heart failure medications as tolerated.  Consider adding spironolactone upon follow-up.  Admitted 04/22/20 due to chest pain. Cardiology consult obtained. Initially placed on heparin drip due to NSTEMI. Discharged after 2 days.   He presents today for an acute visit with a chief complaint of worsening shortness of breath over the last several days. He says that one night he was so short of breath that he had to sit up to breathe. He took a fluid pill and breathing improved  even though he says that he didn't urinate much more than usual. He has associated fatigue, slight weight gain and chronic back pain along with this. He denies any difficulty sleeping (except with episode above), dizziness, abdominal distention, palpitations, pedal edema, chest pain, wheezing or cough.   Says that he's "pretty much out of  everything" except his aspirin but thinks he needs to go back on them because of how he feels. Says that he doesn't "like taking much medicine" and feels like the cholesterol medications don't agree with him.   Past Medical History:  Diagnosis Date  . Anginal pain (HCC)   . Arthritis   . CAD (coronary artery disease)    a. s/p MI with LAD and Diag stenting @ [name];  b. 06/2015 Cath: LAD 37m/d ISR, 100 RCA (ISR) w/ L->R collats, otw mod nonobs dzs-->Med Rx; c. 08/2019 Cath: LM nl, LAD 10p/m ISR, D1 20, D2 100, RI min irregs, LCX 45m/d, OM1 100, OM2 50, RCA 100p, 70d. RPDA fills via collats from LAD. EF 25-35%-->Med Rx; d. 10/2019 NSTEMI/Cath: LCX now 100 (2.75x15 Resolute Onyx DES), otw stable compared to 08/2019.  [name] Chronic combined systolic and diastolic CHF (congestive heart failure) (HCC)    a. 06/2015 Echo: EF 20-25%, Gr3 DD; b. 10/2019 Echo: EF 25-30%, glob HK, sev inf/infapical HK. Mod dil LA.  [name] Dyspnea   . Essential hypertension   . Hyperlipidemia   . Hypokalemia    a. 06/2015 in setting of diuresis.  . Ischemic cardiomyopathy    a. 2011 EF 45% ([name]);  b. 06/2015 Echo: EF 20-25%; c. 04/2019 Echo: EF 40-45%; d. 08/2019 LV gram: EF 25-35%; e. 10/2019 Echo: EF 25-30%.  . PVC's (premature ventricular contractions)    a. 09/2019 Zio (3 days): Avg hr 70, 4 runs NSVT, 5 runs SVT, rare PACs, frequent PVCs w/ 11.8% burden.  . Sleep apnea   . Stroke/Right temporal lobe infarction [facility])    a. 10/2019 MRI brain: 1cm acute ischemic nonhemorrhagic R temporal lobe infarct. Age-related cerebral atrophy w/ moderate chronic small vessel ischemic dzs.  . Type 2 diabetes mellitus with hyperglycemia (HCC) 10/08/2019   Past Surgical History:  Procedure Laterality Date  . BACK SURGERY  04/2019  . CARDIAC CATHETERIZATION N/A 06/10/2015   Procedure: Left Heart Cath;  Surgeon: Iran Ouch, MD;  Location: [facility] INVASIVE CV LAB;  Service: Cardiovascular;  Laterality: N/A;  . CORONARY STENT INTERVENTION N/A  10/08/2019   Procedure: CORONARY STENT INTERVENTION;  Surgeon: Iran Ouch, MD;  Location: [facility] INVASIVE CV LAB;  Service: Cardiovascular;  Laterality: N/A;  . CORONARY STENT PLACEMENT    . LEFT HEART CATH AND CORONARY ANGIOGRAPHY N/A 10/08/2019   Procedure: LEFT HEART CATH AND CORONARY ANGIOGRAPHY poss pci;  Surgeon: Iran Ouch, MD;  Location: [facility] INVASIVE CV LAB;  Service: Cardiovascular;  Laterality: N/A;  . RIGHT/LEFT HEART CATH AND CORONARY ANGIOGRAPHY N/A 08/20/2019   Procedure: RIGHT/LEFT HEART CATH AND CORONARY ANGIOGRAPHY;  Surgeon: Iran Ouch, MD;  Location: [facility] INVASIVE CV LAB;  Service: Cardiovascular;  Laterality: N/A;   Family History  Problem Relation Age of Onset  . Coronary artery disease Mother   . Diabetes Mother   . Coronary artery disease Father    Social History   Tobacco Use  . Smoking status: Former Smoker    Quit date: 06/10/2011    Years since quitting: 9.0  . Smokeless tobacco: Never Used  Substance Use Topics  . Alcohol use: No  Alcohol/week: 0.0 standard drinks   Allergies  Allergen Reactions  . Contrast Media [Iodinated Diagnostic Agents] Shortness Of Breath  . Iohexol Shortness Of Breath     Onset Date: [id number]   . Atorvastatin Other (See Comments)    Myalgias   . Hydrocodone Itching  . Morphine And Related Other (See Comments)    Lost control   . Penicillins Other (See Comments)    Unknown reaction Did it involve swelling of the face/tongue/throat, SOB, or low BP? Unknown Did it involve sudden or severe rash/hives, skin peeling, or any reaction on the inside of your mouth or nose? Unknown Did you need to seek medical attention at a hospital or doctor's office? Yes When did it last happen?Childhood allergy  If all above answers are "NO", may proceed with cephalosporin use.   . Rosuvastatin Other (See Comments)    Myalgias    Prior to Admission medications   Medication Sig Start Date End Date Taking? Authorizing  Provider  acetaminophen (TYLENOL) 500 MG tablet Take 1,000 mg by mouth at bedtime as needed. At bedtime and as needed.   Yes [provider]  aspirin 81 MG chewable tablet Chew 1 tablet (81 mg total) by mouth daily. 06/11/15  Yes Dustin Flock, MD  carvedilol (COREG) 3.125 MG tablet Take 1 tablet (3.125 mg total) by mouth 2 (two) times daily with a meal. 04/24/20  Yes Sharen Hones, MD  clopidogrel (PLAVIX) 75 MG tablet Take 1 tablet (75 mg total) by mouth daily. 10/12/19  Yes Nicole Kindred A, DO  colchicine 0.6 MG tablet Take 1 tablet (0.6 mg total) by mouth daily as needed (gout). 09/13/19  Yes Theora Gianotti, NP  ezetimibe (ZETIA) 10 MG tablet Take 1 tablet (10 mg total) by mouth daily. 04/25/20  Yes Sharen Hones, MD  furosemide (LASIX) 40 MG tablet Take 1 tablet (40 mg total) by mouth daily as needed. Patient taking differently: Take 40 mg by mouth daily as needed for fluid. 11/14/19  Yes Theora Gianotti, NP  isosorbide mononitrate (IMDUR) 30 MG 24 hr tablet Take 0.5 tablets (15 mg total) by mouth daily. 04/25/20  Yes Sharen Hones, MD  nitroGLYCERIN (NITROSTAT) 0.4 MG SL tablet Place 1 tablet (0.4 mg total) under the tongue every 5 (five) minutes as needed for chest pain. 10/11/19  Yes Nicole Kindred A, DO  sacubitril-valsartan (ENTRESTO) 49-51 MG Take 1 tablet by mouth 2 (two) times daily.   Yes [provider]    Review of Systems  Constitutional: Positive for fatigue. Negative for appetite change.  HENT: Positive for congestion and hearing loss. Negative for postnasal drip and sneezing.   Eyes: Negative.   Respiratory: Positive for shortness of breath (worsening). Negative for cough and wheezing.        + snoring  Cardiovascular: Negative for chest pain, palpitations and leg swelling.  Gastrointestinal: Negative for abdominal distention and abdominal pain.  Endocrine: Negative.   Genitourinary: Negative.   Musculoskeletal: Positive for back pain.  Negative for neck pain.  Skin: Negative.   Allergic/Immunologic: Negative.   Neurological: Negative for dizziness, light-headedness and headaches.  Hematological: Negative for adenopathy. Does not bruise/bleed easily.  Psychiatric/Behavioral: Negative for dysphoric mood and sleep disturbance (sleeping on 2-3 pillows ). The patient is not nervous/anxious.    Vitals:   07/07/20 1334  BP: (!) 174/88  Pulse: 71  Resp: 18  SpO2: 97%  Weight: 220 lb (99.8 kg)  Height: 5\' 6"  (1.676 m)   Wt Readings from Last  3 Encounters:  07/07/20 220 lb (99.8 kg)  04/24/20 216 lb 14.4 oz (98.4 kg)  11/14/19 218 lb 6 oz (99.1 kg)   Lab Results  Component Value Date   CREATININE 0.99 04/24/2020   CREATININE 0.85 04/23/2020   CREATININE 0.78 04/22/2020    Physical Exam Vitals and nursing note reviewed.  Constitutional:      Appearance: He is well-developed.  HENT:     Head: Normocephalic and atraumatic.     Right Ear: Decreased hearing noted.     Left Ear: Decreased hearing noted.  Neck:     Vascular: No JVD.  Cardiovascular:     Rate and Rhythm: Normal rate and regular rhythm.  Pulmonary:     Effort: Pulmonary effort is normal. No respiratory distress.     Breath sounds: No wheezing or rales.  Abdominal:     Palpations: Abdomen is soft.     Tenderness: There is no abdominal tenderness.  Musculoskeletal:     Cervical back: Normal range of motion and neck supple.     Right lower leg: No tenderness. No edema.     Left lower leg: No tenderness. No edema.  Skin:    General: Skin is warm and dry.  Neurological:     General: No focal deficit present.     Mental Status: He is alert and oriented to person, place, and time.  Psychiatric:        Mood and Affect: Mood normal.        Behavior: Behavior normal.    Assessment & Plan:  1: Chronic heart failure with reduced ejection fraction- - NYHA class III - euvolemic today - weighing daily; reminded to call for an overnight weight gain of  >2 pounds or a weekly weight gain of >5 pounds - weight up 9 pounds from last visit here 8 months ago - not adding salt to his food but does eat out "often" as he lives alone; eats at [employer] or [employer] often and is aware that foods with sauces have more sodium.  - saw cardiology Brion Aliment) 11/14/19 but did not show for his July appt; have scheduled him an appointment for 07/09/20  - discussed importance of taking different medications for his HF and that they all do different things to help his heart work; agreeable to resuming them except his doesn't want to resume his statin or zetia as he feels like those medications aren't agreeing with him - refilled his carvedilol, plavix, furosemide and isosorbide; getting entresto through [employer] patient assistance and the renewal paperwork was filled out today - taking furosemide PRN & did take it a few days ago - would like to add farxiga and/or spironolactone but need patient to become consistent with taking his medications; he already says that he "was taking too many medications" - pro-BNP done 05/02/2019 [facility]) was 949.0  2: HTN- - BP elevated today but he's only been taking his aspirin and NTG PRN and occasionally furosemide; meds refilled per above - hasn't seen PCP Arlana Pouch) in years; instructed him (again) to call his office and get an appointment scheduled.  - BMP 04/24/20 reviewed and showed sodium 140, potassium 3.8, creatinine 0.99 and GFR >60  3: CAD- - NSTEMI April 2021 - has been only taking aspirin - plavix refilled today; re-educated regarding usage of this medication   Patient did not bring his medications nor a list and does not know what he's taking. Each medication was verbally reviewed with the patient and he was  encouraged to bring the bottles to every visit to confirm accuracy of list.   Return in 1 month or sooner for any questions/problems before then.

## 2020-07-07 NOTE — Patient Instructions (Signed)
Continue weighing daily and call for an overnight weight gain of > 2 pounds or a weekly weight gain of >5 pounds. 

## 2020-07-09 ENCOUNTER — Ambulatory Visit: Payer: Self-pay | Admitting: Nurse Practitioner

## 2020-08-07 ENCOUNTER — Ambulatory Visit: Payer: Self-pay | Admitting: Family

## 2020-08-20 ENCOUNTER — Other Ambulatory Visit: Payer: Self-pay

## 2020-08-20 ENCOUNTER — Ambulatory Visit: Payer: Self-pay | Attending: Family | Admitting: Family

## 2020-08-20 ENCOUNTER — Encounter: Payer: Self-pay | Admitting: Family

## 2020-08-20 VITALS — BP 152/91 | HR 72 | Resp 18 | Ht 66.0 in | Wt 221.0 lb

## 2020-08-20 DIAGNOSIS — I5022 Chronic systolic (congestive) heart failure: Secondary | ICD-10-CM

## 2020-08-20 DIAGNOSIS — Z91041 Radiographic dye allergy status: Secondary | ICD-10-CM | POA: Insufficient documentation

## 2020-08-20 DIAGNOSIS — Z880 Allergy status to penicillin: Secondary | ICD-10-CM | POA: Insufficient documentation

## 2020-08-20 DIAGNOSIS — I110 Hypertensive heart disease with heart failure: Secondary | ICD-10-CM | POA: Insufficient documentation

## 2020-08-20 DIAGNOSIS — Z87891 Personal history of nicotine dependence: Secondary | ICD-10-CM | POA: Insufficient documentation

## 2020-08-20 DIAGNOSIS — R140 Abdominal distension (gaseous): Secondary | ICD-10-CM | POA: Insufficient documentation

## 2020-08-20 DIAGNOSIS — I2510 Atherosclerotic heart disease of native coronary artery without angina pectoris: Secondary | ICD-10-CM | POA: Insufficient documentation

## 2020-08-20 DIAGNOSIS — I10 Essential (primary) hypertension: Secondary | ICD-10-CM

## 2020-08-20 DIAGNOSIS — Z7982 Long term (current) use of aspirin: Secondary | ICD-10-CM | POA: Insufficient documentation

## 2020-08-20 DIAGNOSIS — I252 Old myocardial infarction: Secondary | ICD-10-CM | POA: Insufficient documentation

## 2020-08-20 DIAGNOSIS — Z8249 Family history of ischemic heart disease and other diseases of the circulatory system: Secondary | ICD-10-CM | POA: Insufficient documentation

## 2020-08-20 DIAGNOSIS — Z885 Allergy status to narcotic agent status: Secondary | ICD-10-CM | POA: Insufficient documentation

## 2020-08-20 DIAGNOSIS — Z79899 Other long term (current) drug therapy: Secondary | ICD-10-CM | POA: Insufficient documentation

## 2020-08-20 DIAGNOSIS — I25119 Atherosclerotic heart disease of native coronary artery with unspecified angina pectoris: Secondary | ICD-10-CM

## 2020-08-20 DIAGNOSIS — Z888 Allergy status to other drugs, medicaments and biological substances status: Secondary | ICD-10-CM | POA: Insufficient documentation

## 2020-08-20 DIAGNOSIS — I5042 Chronic combined systolic (congestive) and diastolic (congestive) heart failure: Secondary | ICD-10-CM | POA: Insufficient documentation

## 2020-08-20 DIAGNOSIS — Z955 Presence of coronary angioplasty implant and graft: Secondary | ICD-10-CM | POA: Insufficient documentation

## 2020-08-20 NOTE — Progress Notes (Signed)
Patient ID: Glen Flemings., male    DOB: 1956-03-25, 65 y.o.   MRN: [id number]  HPI  [name] is a 65 y/o male with a history of CAD, HTN, hyperlipidemia, hypokalemia, previous tobacco use and chronic heart failure.   Echo report from 04/22/20 reviewed and showed an EF of 25-30% along with mild/moderate Glen. Echo report from 10/07/19 reviewed and showed an EF of 25-30%. Echo report from 05/04/2019 reviewed and showed an EF of 40-45% along with mild Glen. Echo report from 06/07/15 reviewed and showed an EF of 20-25% along with moderate Glen and mild TR.   Catheterization done 10/08/19 showed:  1st Diag lesion is 60% stenosed.  Prox LAD to Mid LAD lesion is 10% stenosed.  2nd Diag lesion is 100% stenosed.  1st Mrg lesion is 100% stenosed.  Mid Cx to Dist Cx lesion is 100% stenosed.  Prox RCA to Mid RCA lesion is 100% stenosed.  Dist RCA lesion is 70% stenosed.  2nd Mrg lesion is 60% stenosed.  Post intervention, there is a 0% residual stenosis.  A drug-eluting stent was successfully placed using a STENT RESOLUTE ONYX [id number].   1. Significant underlying three-vessel coronary artery disease with patent stents in the LAD and diagonal with moderate in-stent restenosis in the diagonal stent. Chronically occluded RCA stents with right to right bridging and left-to-right collaterals. Diffuse small vessel disease.   The mid/distal left circumflex which was significantly diseased recently is now completely occluded which is the likely culprit for myocardial infarction.  This supplies relatively small size OM 3 which has moderate diffuse atherosclerosis. 2.  Left ventricular angiography was not performed.  EF was 25 to 30% by echo. 3.  Severely elevated left ventricular end-diastolic pressure at 33 mmHg 4.  Successful angioplasty and drug-eluting stent placement to the left circumflex.  Catheterization done 08/20/19 showed:  Prox RCA to Mid RCA lesion is 100% stenosed.  Dist RCA lesion is 70%  stenosed.  There is moderate to severe left ventricular systolic dysfunction.  LV end diastolic pressure is normal.  The left ventricular ejection fraction is 25-35% by visual estimate.  1st Mrg lesion is 100% stenosed.  2nd Mrg lesion is 50% stenosed.  Prox LAD to Mid LAD lesion is 10% stenosed.  1st Diag lesion is 20% stenosed.  2nd Diag lesion is 100% stenosed.  Mid Cx to Dist Cx lesion is 80% stenosed.   1.  Significant underlying three-vessel coronary artery disease with patent stents in the LAD and diagonal without significant restenosis.  Chronically occluded RCA stents with right to right bridging and left-to-right collaterals.  Diffuse small vessel disease.  Distal left circumflex stenosis seems worse than 2016 but this supplies relatively small size OM 3 which has moderate diffuse atherosclerosis. 2.  Moderately to severely reduced LV systolic function with an EF of 25 to 35%. 3.  Right heart catheterization showed normal filling pressures, mild pulmonary hypertension and normal cardiac output. 4. Recommend aggressive treatment of heart failure and up titration of heart failure medications as tolerated.  Consider adding spironolactone upon follow-up.  Admitted 04/22/20 due to chest pain. Cardiology consult obtained. Initially placed on heparin drip due to NSTEMI. Discharged after 2 days.   He presents today for a follow-up visit with a chief complaint of moderate shortness of breath with little exertion. He describes this as chronic in nature having been present for several years although he reports it as worsening over the last few weeks. He has associated fatigue, intermittent  chest pain, abdominal distention and light-headedness along with this. He denies any difficulty sleeping, palpitations, pedal edema, cough, wheezing or weight gain.   Says that he's been out of his entresto for ~ 1 week and is taking his isosorbide as 30mg  every other day because of difficulty in  cutting it. Takes his furosemide PRN but did take it yesterday because of his symptoms.   He also admits to eating out frequently but this has increased and he's been eating 2 sausage biscuits at [employer] 2- 3 times/ week.   Has not been back to cardiology because he didn't like "the flow of things".   Past Medical History:  Diagnosis Date  . Anginal pain (HCC)   . Arthritis   . CAD (coronary artery disease)    a. s/p MI with LAD and Diag stenting @ [name];  b. 06/2015 Cath: LAD 9m/d ISR, 100 RCA (ISR) w/ L->R collats, otw mod nonobs dzs-->Med Rx; c. 08/2019 Cath: LM nl, LAD 10p/m ISR, D1 20, D2 100, RI min irregs, LCX 14m/d, OM1 100, OM2 50, RCA 100p, 70d. RPDA fills via collats from LAD. EF 25-35%-->Med Rx; d. 10/2019 NSTEMI/Cath: LCX now 100 (2.75x15 Resolute Onyx DES), otw stable compared to 08/2019.  [name] Chronic combined systolic and diastolic CHF (congestive heart failure) (HCC)    a. 06/2015 Echo: EF 20-25%, Gr3 DD; b. 10/2019 Echo: EF 25-30%, glob HK, sev inf/infapical HK. Mod dil LA.  [name] Dyspnea   . Essential hypertension   . Hyperlipidemia   . Hypokalemia    a. 06/2015 in setting of diuresis.  . Ischemic cardiomyopathy    a. 2011 EF 45% ([name]);  b. 06/2015 Echo: EF 20-25%; c. 04/2019 Echo: EF 40-45%; d. 08/2019 LV gram: EF 25-35%; e. 10/2019 Echo: EF 25-30%.  . PVC's (premature ventricular contractions)    a. 09/2019 Zio (3 days): Avg hr 70, 4 runs NSVT, 5 runs SVT, rare PACs, frequent PVCs w/ 11.8% burden.  . Sleep apnea   . Stroke/Right temporal lobe infarction [facility])    a. 10/2019 MRI brain: 1cm acute ischemic nonhemorrhagic R temporal lobe infarct. Age-related cerebral atrophy w/ moderate chronic small vessel ischemic dzs.  . Type 2 diabetes mellitus with hyperglycemia (HCC) 10/08/2019   Past Surgical History:  Procedure Laterality Date  . BACK SURGERY  04/2019  . CARDIAC CATHETERIZATION N/A 06/10/2015   Procedure: Left Heart Cath;  Surgeon: Iran Ouch, MD;  Location: [facility]  INVASIVE CV LAB;  Service: Cardiovascular;  Laterality: N/A;  . CORONARY STENT INTERVENTION N/A 10/08/2019   Procedure: CORONARY STENT INTERVENTION;  Surgeon: Iran Ouch, MD;  Location: [facility] INVASIVE CV LAB;  Service: Cardiovascular;  Laterality: N/A;  . CORONARY STENT PLACEMENT    . LEFT HEART CATH AND CORONARY ANGIOGRAPHY N/A 10/08/2019   Procedure: LEFT HEART CATH AND CORONARY ANGIOGRAPHY poss pci;  Surgeon: Iran Ouch, MD;  Location: [facility] INVASIVE CV LAB;  Service: Cardiovascular;  Laterality: N/A;  . RIGHT/LEFT HEART CATH AND CORONARY ANGIOGRAPHY N/A 08/20/2019   Procedure: RIGHT/LEFT HEART CATH AND CORONARY ANGIOGRAPHY;  Surgeon: Iran Ouch, MD;  Location: [facility] INVASIVE CV LAB;  Service: Cardiovascular;  Laterality: N/A;   Family History  Problem Relation Age of Onset  . Coronary artery disease Mother   . Diabetes Mother   . Coronary artery disease Father    Social History   Tobacco Use  . Smoking status: Former Smoker    Quit date: 06/10/2011    Years since quitting: 9.2  .  Smokeless tobacco: Never Used  Substance Use Topics  . Alcohol use: No    Alcohol/week: 0.0 standard drinks   Allergies  Allergen Reactions  . Contrast Media [Iodinated Diagnostic Agents] Shortness Of Breath  . Iohexol Shortness Of Breath     Onset Date: [id number]   . Atorvastatin Other (See Comments)    Myalgias   . Hydrocodone Itching  . Morphine And Related Other (See Comments)    Lost control   . Penicillins Other (See Comments)    Unknown reaction Did it involve swelling of the face/tongue/throat, SOB, or low BP? Unknown Did it involve sudden or severe rash/hives, skin peeling, or any reaction on the inside of your mouth or nose? Unknown Did you need to seek medical attention at a hospital or doctor's office? Yes When did it last happen?Childhood allergy  If all above answers are "NO", may proceed with cephalosporin use.   . Rosuvastatin Other (See Comments)     Myalgias    Prior to Admission medications   Medication Sig Start Date End Date Taking? Authorizing Provider  acetaminophen (TYLENOL) 500 MG tablet Take 1,000 mg by mouth at bedtime as needed. At bedtime and as needed.   Yes [provider]  aspirin 81 MG chewable tablet Chew 1 tablet (81 mg total) by mouth daily. 06/11/15  Yes Auburn BilberryPatel, Shreyang, MD  carvedilol (COREG) 3.125 MG tablet Take 1 tablet (3.125 mg total) by mouth 2 (two) times daily with a meal. 07/07/20  Yes Clarisa KindredHackney, Leinaala Catanese A, FNP  clopidogrel (PLAVIX) 75 MG tablet Take 1 tablet (75 mg total) by mouth daily. 07/07/20  Yes Clarisa KindredHackney, Izel Eisenhardt A, FNP  ezetimibe (ZETIA) 10 MG tablet Take 1 tablet (10 mg total) by mouth daily. 04/25/20  Yes Marrion CoyZhang, Dekui, MD  furosemide (LASIX) 40 MG tablet Take 1 tablet (40 mg total) by mouth daily as needed. 07/07/20  Yes Clarisa KindredHackney, Avari Gelles A, FNP  isosorbide mononitrate (IMDUR) 30 MG 24 hr tablet Take 0.5 tablets (15 mg total) by mouth daily. Patient taking differently: Taking 30 mg by mouth every other day 07/07/20  Yes Clarisa KindredHackney, Vraj Denardo A, FNP  nitroGLYCERIN (NITROSTAT) 0.4 MG SL tablet Place 1 tablet (0.4 mg total) under the tongue every 5 (five) minutes as needed for chest pain. 10/11/19  Yes Esaw GrandchildGriffith, Kelly A, DO  sacubitril-valsartan (ENTRESTO) 49-51 MG Take 1 tablet by mouth 2 (two) times daily. Patient not taking: Reported on 08/20/2020    [provider]    Review of Systems  Constitutional: Positive for fatigue. Negative for appetite change.  HENT: Positive for congestion and hearing loss. Negative for postnasal drip and sneezing.   Eyes: Negative.   Respiratory: Positive for shortness of breath (worsening). Negative for cough and wheezing.        + snoring  Cardiovascular: Positive for chest pain (couple of weeks for fleeting moment). Negative for palpitations and leg swelling.  Gastrointestinal: Positive for abdominal distention. Negative for abdominal pain.  Endocrine: Negative.   Genitourinary:  Negative.   Musculoskeletal: Positive for back pain. Negative for neck pain.  Skin: Negative.   Allergic/Immunologic: Negative.   Neurological: Positive for light-headedness. Negative for dizziness and headaches.  Hematological: Negative for adenopathy. Does not bruise/bleed easily.  Psychiatric/Behavioral: Negative for dysphoric mood and sleep disturbance (sleeping on 2-3 pillows ). The patient is not nervous/anxious.    Vitals:   08/20/20 1258  BP: (!) 152/91  Pulse: 72  Resp: 18  SpO2: 95%  Weight: 221 lb (100.2 kg)  Height: 5'  6" (1.676 m)   Wt Readings from Last 3 Encounters:  08/20/20 221 lb (100.2 kg)  07/07/20 220 lb (99.8 kg)  04/24/20 216 lb 14.4 oz (98.4 kg)   Lab Results  Component Value Date   CREATININE 0.99 04/24/2020   CREATININE 0.85 04/23/2020   CREATININE 0.78 04/22/2020    Physical Exam Vitals and nursing note reviewed.  Constitutional:      Appearance: He is well-developed.  HENT:     Head: Normocephalic and atraumatic.     Right Ear: Decreased hearing noted.     Left Ear: Decreased hearing noted.  Neck:     Vascular: No JVD.  Cardiovascular:     Rate and Rhythm: Normal rate and regular rhythm.  Pulmonary:     Effort: Pulmonary effort is normal. No respiratory distress.     Breath sounds: No wheezing or rales.  Abdominal:     General: There is distension.     Tenderness: There is no abdominal tenderness.     Comments: Firm to palpation  Musculoskeletal:     Cervical back: Normal range of motion and neck supple.     Right lower leg: No tenderness. No edema.     Left lower leg: No tenderness. No edema.  Skin:    General: Skin is warm and dry.  Neurological:     General: No focal deficit present.     Mental Status: He is alert and oriented to person, place, and time.  Psychiatric:        Mood and Affect: Mood normal.        Behavior: Behavior normal.    Assessment & Plan:  1: Chronic heart failure with reduced ejection fraction- -  NYHA class III - minimally fluid overloaded today with increased abdominal distention - weighing daily; reminded to call for an overnight weight gain of >2 pounds or a weekly weight gain of >5 pounds - weight up 1 pound from last visit here 6 weeks ago - not adding salt to his food but does eat out "often" as he lives alone; eats at [employer] or [employer] often and is aware that foods with sauces have more sodium.  - has started eating numerous times/ week at [employer]; explained that he's getting way too much salt by doing this - #56 samples of entresto 49/51mg  given today - saw cardiology Brion Aliment) 11/14/19 but cancelled his appointment on 07/09/20; patient requests a new cardiologist - offered Dr. Welton Flakes or [facility] and he asks for Dr. Welton Flakes; appointment was scheduled for 09/02/20 - taking furosemide PRN & did take it yesterday; instructed to take it daily for the next 3 days - would like to add farxiga and/or spironolactone but need patient to become consistent with taking his medications; he already says that he is "taking too many medications" - pro-BNP done 05/02/2019 [facility]) was 949.0  2: HTN- - BP mildly elevated but he's been out of entresto; samples provided per above as he's waiting on mail order shipment from [employer] to arrive - hasn't seen PCP Arlana Pouch) in years; tried calling Dr. Maree Krabbe office to schedule patient an appointment as patient still hasn't scheduled it but the office was closed for lunch; Emphasized that patient needed to call this afternoon and get something scheduled and he assures me that he will - BMP 04/24/20 reviewed and showed sodium 140, potassium 3.8, creatinine 0.99 and GFR >60  3: CAD- - NSTEMI April 2021 - has been taking isosorbide 30mg  QOD instead of 15mg  daily;  does note more frequent chest pain - instructed to take 30mg  daily until Dr sees him   Patient did not bring his medications nor a list and does not know what he's taking. Each medication  was verbally reviewed with the patient and he was encouraged to bring the bottles to every visit to confirm accuracy of list.    Return in 3 months or sooner for any questions/problems before then.

## 2020-08-20 NOTE — Patient Instructions (Addendum)
Continue weighing daily and call for an overnight weight gain of > 2 pounds or a weekly weight gain of >5 pounds.   Take a whole isosorbide tablet daily   [facility]  March 1st 1245pm [address], [place], [place] [address] [phone]   Dr. Dewaine Oats, PCP [address] [place], [place] [address] [phone]

## 2020-08-21 ENCOUNTER — Encounter: Payer: Self-pay | Admitting: Family

## 2020-09-15 ENCOUNTER — Telehealth: Payer: Self-pay | Admitting: Family

## 2020-09-15 NOTE — Telephone Encounter (Signed)
LVM with patient letting him know that his application for Sherryll Burger was approved until 08/08/21 and he needs to call to set up mail order if it wasn't done already.   Maurianna Benard, NT

## 2020-11-17 ENCOUNTER — Ambulatory Visit: Payer: Self-pay | Attending: Family | Admitting: Family

## 2020-11-17 ENCOUNTER — Encounter: Payer: Self-pay | Admitting: Family

## 2020-11-17 ENCOUNTER — Other Ambulatory Visit: Payer: Self-pay

## 2020-11-17 VITALS — BP 153/93 | HR 43 | Resp 20 | Ht 66.0 in | Wt 215.1 lb

## 2020-11-17 DIAGNOSIS — I110 Hypertensive heart disease with heart failure: Secondary | ICD-10-CM | POA: Insufficient documentation

## 2020-11-17 DIAGNOSIS — I252 Old myocardial infarction: Secondary | ICD-10-CM | POA: Insufficient documentation

## 2020-11-17 DIAGNOSIS — M549 Dorsalgia, unspecified: Secondary | ICD-10-CM | POA: Insufficient documentation

## 2020-11-17 DIAGNOSIS — Z8249 Family history of ischemic heart disease and other diseases of the circulatory system: Secondary | ICD-10-CM | POA: Insufficient documentation

## 2020-11-17 DIAGNOSIS — E876 Hypokalemia: Secondary | ICD-10-CM | POA: Insufficient documentation

## 2020-11-17 DIAGNOSIS — I272 Pulmonary hypertension, unspecified: Secondary | ICD-10-CM | POA: Insufficient documentation

## 2020-11-17 DIAGNOSIS — Z87891 Personal history of nicotine dependence: Secondary | ICD-10-CM | POA: Insufficient documentation

## 2020-11-17 DIAGNOSIS — I2510 Atherosclerotic heart disease of native coronary artery without angina pectoris: Secondary | ICD-10-CM | POA: Insufficient documentation

## 2020-11-17 DIAGNOSIS — E785 Hyperlipidemia, unspecified: Secondary | ICD-10-CM | POA: Insufficient documentation

## 2020-11-17 DIAGNOSIS — L0232 Furuncle of buttock: Secondary | ICD-10-CM | POA: Insufficient documentation

## 2020-11-17 DIAGNOSIS — Z9114 Patient's other noncompliance with medication regimen: Secondary | ICD-10-CM | POA: Insufficient documentation

## 2020-11-17 DIAGNOSIS — G8929 Other chronic pain: Secondary | ICD-10-CM | POA: Insufficient documentation

## 2020-11-17 DIAGNOSIS — I10 Essential (primary) hypertension: Secondary | ICD-10-CM

## 2020-11-17 DIAGNOSIS — M109 Gout, unspecified: Secondary | ICD-10-CM | POA: Insufficient documentation

## 2020-11-17 DIAGNOSIS — I25119 Atherosclerotic heart disease of native coronary artery with unspecified angina pectoris: Secondary | ICD-10-CM

## 2020-11-17 DIAGNOSIS — Z880 Allergy status to penicillin: Secondary | ICD-10-CM | POA: Insufficient documentation

## 2020-11-17 DIAGNOSIS — Z955 Presence of coronary angioplasty implant and graft: Secondary | ICD-10-CM | POA: Insufficient documentation

## 2020-11-17 DIAGNOSIS — Z885 Allergy status to narcotic agent status: Secondary | ICD-10-CM | POA: Insufficient documentation

## 2020-11-17 DIAGNOSIS — I5022 Chronic systolic (congestive) heart failure: Secondary | ICD-10-CM | POA: Insufficient documentation

## 2020-11-17 MED ORDER — NITROGLYCERIN 0.4 MG SL SUBL: 0.4000 mg | SUBLINGUAL_TABLET | SUBLINGUAL | 0 refills | Status: DC | PRN

## 2020-11-17 NOTE — Progress Notes (Signed)
Patient ID: Glen Flemings., male    DOB: 1956/06/13, 65 y.o.   MRN: [id number]  HPI  [name] is a 65 y/o male with a history of CAD, HTN, hyperlipidemia, hypokalemia, previous tobacco use and chronic heart failure.   Echo report from 05/04/2019 reviewed and showed an EF of 40-45%. Echo report from 04/22/20 reviewed and showed an EF of 25-30% along with mild/moderate Glen. Echo report from 10/07/19 reviewed and showed an EF of 25-30%. Echo report from 05/04/2019 reviewed and showed an EF of 40-45% along with mild Glen. Echo report from 06/07/15 reviewed and showed an EF of 20-25% along with moderate Glen and mild TR.   Catheterization done 10/08/19 showed:  1st Diag lesion is 60% stenosed.  Prox LAD to Mid LAD lesion is 10% stenosed.  2nd Diag lesion is 100% stenosed.  1st Mrg lesion is 100% stenosed.  Mid Cx to Dist Cx lesion is 100% stenosed.  Prox RCA to Mid RCA lesion is 100% stenosed.  Dist RCA lesion is 70% stenosed.  2nd Mrg lesion is 60% stenosed.  Post intervention, there is a 0% residual stenosis.  A drug-eluting stent was successfully placed using a STENT RESOLUTE ONYX [id number].   1. Significant underlying three-vessel coronary artery disease with patent stents in the LAD and diagonal with moderate in-stent restenosis in the diagonal stent. Chronically occluded RCA stents with right to right bridging and left-to-right collaterals. Diffuse small vessel disease.   The mid/distal left circumflex which was significantly diseased recently is now completely occluded which is the likely culprit for myocardial infarction.  This supplies relatively small size OM 3 which has moderate diffuse atherosclerosis. 2.  Left ventricular angiography was not performed.  EF was 25 to 30% by echo. 3.  Severely elevated left ventricular end-diastolic pressure at 33 mmHg 4.  Successful angioplasty and drug-eluting stent placement to the left circumflex.  Catheterization done 08/20/19 showed:  Prox  RCA to Mid RCA lesion is 100% stenosed.  Dist RCA lesion is 70% stenosed.  There is moderate to severe left ventricular systolic dysfunction.  LV end diastolic pressure is normal.  The left ventricular ejection fraction is 25-35% by visual estimate.  1st Mrg lesion is 100% stenosed.  2nd Mrg lesion is 50% stenosed.  Prox LAD to Mid LAD lesion is 10% stenosed.  1st Diag lesion is 20% stenosed.  2nd Diag lesion is 100% stenosed.  Mid Cx to Dist Cx lesion is 80% stenosed.   1.  Significant underlying three-vessel coronary artery disease with patent stents in the LAD and diagonal without significant restenosis.  Chronically occluded RCA stents with right to right bridging and left-to-right collaterals.  Diffuse small vessel disease.  Distal left circumflex stenosis seems worse than 2016 but this supplies relatively small size OM 3 which has moderate diffuse atherosclerosis. 2.  Moderately to severely reduced LV systolic function with an EF of 25 to 35%. 3.  Right heart catheterization showed normal filling pressures, mild pulmonary hypertension and normal cardiac output. 4. Recommend aggressive treatment of heart failure and up titration of heart failure medications as tolerated.  Consider adding spironolactone upon follow-up.  Has not been admitted or been in the ED in the last 6 months.   He presents today for a follow-up visit with a chief complaint of minimal shortness of breath. He describes this as chronic in nature having been present for several years. He has associated fatigue, head congestion, light-headedness and chronic back pain along with this. He  denies any difficulty sleeping, abdominal distention, palpitations, pedal edema, chest pain, cough, headache or weight gain.   Does mention that he has a boil right above his rectum that has become quite painful. Reports having to have previous one lanced.   Says that he's only taking entresto and furosemide PRN because he's "too  lazy to take the rest".   Past Medical History:  Diagnosis Date  . Anginal pain (HCC)   . Arthritis   . CAD (coronary artery disease)    a. s/p MI with LAD and Diag stenting @ [name];  b. 06/2015 Cath: LAD 6846m/d ISR, 100 RCA (ISR) w/ L->R collats, otw mod nonobs dzs-->Med Rx; c. 08/2019 Cath: LM nl, LAD 10p/m ISR, D1 20, D2 100, RI min irregs, LCX 6240m/d, OM1 100, OM2 50, RCA 100p, 70d. RPDA fills via collats from LAD. EF 25-35%-->Med Rx; d. 10/2019 NSTEMI/Cath: LCX now 100 (2.75x15 Resolute Onyx DES), otw stable compared to 08/2019.  [name]. Chronic combined systolic and diastolic CHF (congestive heart failure) (HCC)    a. 06/2015 Echo: EF 20-25%, Gr3 DD; b. 10/2019 Echo: EF 25-30%, glob HK, sev inf/infapical HK. Mod dil LA.  [name]. Dyspnea   . Essential hypertension   . Hyperlipidemia   . Hypokalemia    a. 06/2015 in setting of diuresis.  . Ischemic cardiomyopathy    a. 2011 EF 45% ([name]);  b. 06/2015 Echo: EF 20-25%; c. 04/2019 Echo: EF 40-45%; d. 08/2019 LV gram: EF 25-35%; e. 10/2019 Echo: EF 25-30%.  . PVC's (premature ventricular contractions)    a. 09/2019 Zio (3 days): Avg hr 70, 4 runs NSVT, 5 runs SVT, rare PACs, frequent PVCs w/ 11.8% burden.  . Sleep apnea   . Stroke/Right temporal lobe infarction [facility](HCC)    a. 10/2019 MRI brain: 1cm acute ischemic nonhemorrhagic R temporal lobe infarct. Age-related cerebral atrophy w/ moderate chronic small vessel ischemic dzs.  . Type 2 diabetes mellitus with hyperglycemia (HCC) 10/08/2019   Past Surgical History:  Procedure Laterality Date  . BACK SURGERY  04/2019  . CARDIAC CATHETERIZATION N/A 06/10/2015   Procedure: Left Heart Cath;  Surgeon: Iran OuchMuhammad A Arida, MD;  Location: [facility] INVASIVE CV LAB;  Service: Cardiovascular;  Laterality: N/A;  . CORONARY STENT INTERVENTION N/A 10/08/2019   Procedure: CORONARY STENT INTERVENTION;  Surgeon: Iran OuchArida, Muhammad A, MD;  Location: [facility] INVASIVE CV LAB;  Service: Cardiovascular;  Laterality: N/A;  . CORONARY STENT PLACEMENT     . LEFT HEART CATH AND CORONARY ANGIOGRAPHY N/A 10/08/2019   Procedure: LEFT HEART CATH AND CORONARY ANGIOGRAPHY poss pci;  Surgeon: Iran OuchArida, Muhammad A, MD;  Location: [facility] INVASIVE CV LAB;  Service: Cardiovascular;  Laterality: N/A;  . RIGHT/LEFT HEART CATH AND CORONARY ANGIOGRAPHY N/A 08/20/2019   Procedure: RIGHT/LEFT HEART CATH AND CORONARY ANGIOGRAPHY;  Surgeon: Iran OuchArida, Muhammad A, MD;  Location: [facility] INVASIVE CV LAB;  Service: Cardiovascular;  Laterality: N/A;   Family History  Problem Relation Age of Onset  . Coronary artery disease Mother   . Diabetes Mother   . Coronary artery disease Father    Social History   Tobacco Use  . Smoking status: Former Smoker    Quit date: 06/10/2011    Years since quitting: 9.4  . Smokeless tobacco: Never Used  Substance Use Topics  . Alcohol use: No    Alcohol/week: 0.0 standard drinks   Allergies  Allergen Reactions  . Contrast Media [Iodinated Diagnostic Agents] Shortness Of Breath  . Iohexol Shortness Of Breath     Onset  Date: 16109604   . Atorvastatin Other (See Comments)    Myalgias   . Hydrocodone Itching  . Morphine And Related Other (See Comments)    Lost control   . Penicillins Other (See Comments)    Unknown reaction Did it involve swelling of the face/tongue/throat, SOB, or low BP? Unknown Did it involve sudden or severe rash/hives, skin peeling, or any reaction on the inside of your mouth or nose? Unknown Did you need to seek medical attention at a hospital or doctor's office? Yes When did it last happen?Childhood allergy  If all above answers are "NO", may proceed with cephalosporin use.   . Rosuvastatin Other (See Comments)    Myalgias    Prior to Admission medications   Medication Sig Start Date End Date Taking? Authorizing Provider  acetaminophen (TYLENOL) 500 MG tablet Take 1,000 mg by mouth at bedtime as needed. At bedtime and as needed.   Yes [provider]  furosemide (LASIX) 40 MG tablet Take 1  tablet (40 mg total) by mouth daily as needed. 07/07/20  Yes Rana Hochstein A, FNP  sacubitril-valsartan (ENTRESTO) 49-51 MG Take 1 tablet by mouth 2 (two) times daily.   Yes [provider]  aspirin 81 MG chewable tablet Chew 1 tablet (81 mg total) by mouth daily. Patient not taking: Reported on 11/17/2020 06/11/15   Auburn Bilberry, MD  carvedilol (COREG) 3.125 MG tablet Take 1 tablet (3.125 mg total) by mouth 2 (two) times daily with a meal. Patient not taking: Reported on 11/17/2020 07/07/20   Delma Freeze, FNP  clopidogrel (PLAVIX) 75 MG tablet Take 1 tablet (75 mg total) by mouth daily. Patient not taking: Reported on 11/17/2020 07/07/20   Delma Freeze, FNP  ezetimibe (ZETIA) 10 MG tablet Take 1 tablet (10 mg total) by mouth daily. Patient not taking: Reported on 11/17/2020 04/25/20   Marrion Coy, MD  isosorbide mononitrate (IMDUR) 30 MG 24 hr tablet Take 0.5 tablets (15 mg total) by mouth daily. Patient not taking: Reported on 11/17/2020 07/07/20   Delma Freeze, FNP  nitroGLYCERIN (NITROSTAT) 0.4 MG SL tablet Place 1 tablet (0.4 mg total) under the tongue every 5 (five) minutes as needed for chest pain. 11/17/20   Delma Freeze, FNP   '  Review of Systems  Constitutional: Positive for fatigue. Negative for appetite change.  HENT: Positive for congestion and hearing loss. Negative for postnasal drip and sneezing.   Eyes: Negative.   Respiratory: Positive for shortness of breath (minimal). Negative for cough and wheezing.        + snoring  Cardiovascular: Negative for chest pain, palpitations and leg swelling.  Gastrointestinal: Negative for abdominal distention and abdominal pain.  Endocrine: Negative.   Genitourinary: Negative.   Musculoskeletal: Positive for back pain. Negative for neck pain.  Skin: Negative.   Allergic/Immunologic: Negative.   Neurological: Positive for light-headedness. Negative for dizziness and headaches.  Hematological: Negative for adenopathy. Does  not bruise/bleed easily.  Psychiatric/Behavioral: Negative for dysphoric mood and sleep disturbance (sleeping on 2-3 pillows ). The patient is not nervous/anxious.    Vitals:   11/17/20 1347  BP: (!) 153/93  Pulse: (!) 43  Resp: 20  SpO2: 95%  Weight: 215 lb 2 oz (97.6 kg)  Height: 5\' 6"  (1.676 m)   Wt Readings from Last 3 Encounters:  11/17/20 215 lb 2 oz (97.6 kg)  08/20/20 221 lb (100.2 kg)  07/07/20 220 lb (99.8 kg)   Lab Results  Component Value Date  CREATININE 0.99 04/24/2020   CREATININE 0.85 04/23/2020   CREATININE 0.78 04/22/2020    Physical Exam Vitals and nursing note reviewed.  Constitutional:      Appearance: He is well-developed.  HENT:     Head: Normocephalic and atraumatic.     Right Ear: Decreased hearing noted.     Left Ear: Decreased hearing noted.  Neck:     Vascular: No JVD.  Cardiovascular:     Rate and Rhythm: Normal rate and regular rhythm.  Pulmonary:     Effort: Pulmonary effort is normal. No respiratory distress.     Breath sounds: No wheezing or rales.  Abdominal:     General: There is no distension.     Palpations: Abdomen is soft.     Tenderness: There is no abdominal tenderness.     Comments:    Musculoskeletal:     Cervical back: Normal range of motion and neck supple.     Right lower leg: No tenderness. No edema.     Left lower leg: No tenderness. No edema.  Skin:    General: Skin is warm and dry.  Neurological:     General: No focal deficit present.     Mental Status: He is alert and oriented to person, place, and time.  Psychiatric:        Mood and Affect: Mood normal.        Behavior: Behavior normal.    Assessment & Plan:  1: Chronic heart failure with reduced ejection fraction- - NYHA class III - euvolemic today - weighing daily; reminded to call for an overnight weight gain of >2 pounds or a weekly weight gain of >5 pounds - weight down 6 pounds from last visit here 3 months ago - not adding salt to his food  but does eat out "often" as he lives alone; eats at [employer] or [employer] often and is aware that foods with sauces have more sodium.  - has recently eaten "a lot" of popcorn and knew it had salt in it so had to take his furosemide - saw cardiology Brion Aliment) 11/14/19 but cancelled his appointment on 07/09/20; patient requests another cardiology appointment somewhere - had scheduled appt with Dr. Welton Flakes 09/02/20 but when patient arrived, he said there was too much paperwork to fill out so he left - refuses to go to [facility] and says he will go to [place]; [facility] [place] called and appointment was scheduled for July (soonest they had) - referral was placed - emphasized that he needed to take his medications - taking furosemide PRN & did have to take it a few days due to eating popcorn; advised to eat very little of that - would like to add farxiga and/or spironolactone but need patient to become consistent with taking his medications; he already says that he is "taking too many medications" - pro-BNP done 05/02/2019 [facility]) was 949.0  2: HTN- - BP elevated but he's stopped taking almost everything - hasn't seen PCP Arlana Pouch) since 2018; NT called the office and they said patient has had previous issues with staff so they will no longer see him - Number to [facility] placed on AVS and, again, emphasized he needed to call and get established - BMP 04/24/20 reviewed and showed sodium 140, potassium 3.8, creatinine 0.99 and GFR >60  3: CAD- - NSTEMI April 2021 - had appt made with Dr Welton Flakes but he didn't complete paperwork and left, refuses to go to [facility] or back  to [facility]; requests to go to [place] & this was scheduled for 01/27/21   Patient did not bring his medications nor a list and does not know what he's taking. Each medication was verbally reviewed with the patient and he was encouraged to bring the bottles to every visit to confirm accuracy of list.   Explained that I couldn't  treat his boil or refill any gout medication, that those were PCP issues. Should the pain from the boil worsen, he may have to go to urgent care for it to be lanced.   Return here in 3 months or sooner for any questions/problems before then.

## 2020-11-17 NOTE — Patient Instructions (Addendum)
Continue weighing daily and call for an overnight weight gain of > 2 pounds or a weekly weight gain of >5 pounds.   [facility] [address] [place], [place] [address] [phone]  *Call to schedule Primary Care* [facility] [address] [place], [place], [place] [address] [phone]

## 2021-01-26 NOTE — Progress Notes (Deleted)
Cardiology Office Note:    Date:  01/26/2021   ID:  [name]., DOB [birth year], MRN [id number]  PCP:  Glen Shaggy, MD   [facility] Providers Cardiologist:  Glen Bears, MD { Click to update primary MD,subspecialty MD or APP then REFRESH:1}    CC: Transition to new Cardiologist  History of Present Illness:    [name]. is a 65 y.o. male with a hx of CAD s/p CTO RCA with collaterals, 10/2019 PCI of Lcx, LAD stenting with ISR; HFrEF ~ 25% Ischemic cardiomyopathy, DM and HTN, HLD, Frequent PVCs in 2021, OSA *** CPAP,and hx of prior stroke.  Presents to establish care 01/27/21.  Patient notes that (s)he is doing ***.    No chest pain or pressure ***.  No SOB/DOE*** and no PND/Orthopnea***.  No weight gain or leg swelling***.  No palpitations or syncope ***.  Ambulatory blood pressure ***.      Past Medical History:  Diagnosis Date   Anginal pain (HCC)    Arthritis    CAD (coronary artery disease)    a. s/p MI with LAD and Diag stenting @ [name];  b. 06/2015 Cath: LAD 43m/d ISR, 100 RCA (ISR) w/ L->R collats, otw mod nonobs dzs-->Med Rx; c. 08/2019 Cath: LM nl, LAD 10p/m ISR, D1 20, D2 100, RI min irregs, LCX 54m/d, OM1 100, OM2 50, RCA 100p, 70d. RPDA fills via collats from LAD. EF 25-35%-->Med Rx; d. 10/2019 NSTEMI/Cath: LCX now 100 (2.75x15 Resolute Onyx DES), otw stable compared to 08/2019.   Chronic combined systolic and diastolic CHF (congestive heart failure) (HCC)    a. 06/2015 Echo: EF 20-25%, Gr3 DD; b. 10/2019 Echo: EF 25-30%, glob HK, sev inf/infapical HK. Mod dil LA.   Dyspnea    Essential hypertension    Hyperlipidemia    Hypokalemia    a. 06/2015 in setting of diuresis.   Ischemic cardiomyopathy    a. 2011 EF 45% ([name]);  b. 06/2015 Echo: EF 20-25%; c. 04/2019 Echo: EF 40-45%; d. 08/2019 LV gram: EF 25-35%; e. 10/2019 Echo: EF 25-30%.   PVC's (premature ventricular contractions)    a. 09/2019 Zio (3 days): Avg hr 70, 4 runs NSVT, 5 runs SVT, rare PACs,  frequent PVCs w/ 11.8% burden.   Sleep apnea    Stroke/Right temporal lobe infarction [facility])    a. 10/2019 MRI brain: 1cm acute ischemic nonhemorrhagic R temporal lobe infarct. Age-related cerebral atrophy w/ moderate chronic small vessel ischemic dzs.   Type 2 diabetes mellitus with hyperglycemia (HCC) 10/08/2019    Past Surgical History:  Procedure Laterality Date   BACK SURGERY  04/2019   CARDIAC CATHETERIZATION N/A 06/10/2015   Procedure: Left Heart Cath;  Surgeon: Glen Ouch, MD;  Location: [facility] INVASIVE CV LAB;  Service: Cardiovascular;  Laterality: N/A;   CORONARY STENT INTERVENTION N/A 10/08/2019   Procedure: CORONARY STENT INTERVENTION;  Surgeon: Glen Ouch, MD;  Location: [facility] INVASIVE CV LAB;  Service: Cardiovascular;  Laterality: N/A;   CORONARY STENT PLACEMENT     LEFT HEART CATH AND CORONARY ANGIOGRAPHY N/A 10/08/2019   Procedure: LEFT HEART CATH AND CORONARY ANGIOGRAPHY poss pci;  Surgeon: Glen Ouch, MD;  Location: [facility] INVASIVE CV LAB;  Service: Cardiovascular;  Laterality: N/A;   RIGHT/LEFT HEART CATH AND CORONARY ANGIOGRAPHY N/A 08/20/2019   Procedure: RIGHT/LEFT HEART CATH AND CORONARY ANGIOGRAPHY;  Surgeon: Glen Ouch, MD;  Location: [facility] INVASIVE CV LAB;  Service: Cardiovascular;  Laterality: N/A;  Current Medications: No outpatient medications have been marked as taking for the 01/27/21 encounter (Appointment) with Glen Constant, MD.     Allergies:   Contrast media [iodinated diagnostic agents], Iohexol, Atorvastatin, Hydrocodone, Morphine and related, Penicillins, and Rosuvastatin   Social History   Socioeconomic History   Marital status: Widowed    Spouse name: Not on file   Number of children: 2   Years of education: Not on file   Highest education level: GED or equivalent  Occupational History   Occupation: Statistician   Tobacco Use   Smoking status: Former    Types: Cigarettes    Quit date: 06/10/2011    Years since  quitting: 9.6   Smokeless tobacco: Never  Substance and Sexual Activity   Alcohol use: No    Alcohol/week: 0.0 standard drinks   Drug use: No   Sexual activity: Not Currently  Other Topics Concern   Not on file  Social History Narrative   Not on file   Social Determinants of Health   Financial Resource Strain: Not on file  Food Insecurity: Not on file  Transportation Needs: Not on file  Physical Activity: Not on file  Stress: Not on file  Social Connections: Not on file     Family History: The patient's family history includes Coronary artery disease in his father and mother; Diabetes in his mother.  ROS:   Please see the history of present illness.     All other systems reviewed and are negative.  EKGs/Labs/Other Studies Reviewed:    The following studies were reviewed today:   EKG:  EKG is *** ordered today.  The ekg ordered today demonstrates *** 01/27/21: ***  Cardiac Event Monitoring: Date: 09/13/2019 Results: Normal sinus rhythm with an average heart rate of 70 bpm. 4 beat run of nonsustained ventricular tachycardia.  SVT with aberrancy cannot be excluded. 5 episodes of SVT the longest lasted 12 seconds. Rare PACs. Frequent PVCs with a burden of 11.8%.  Transthoracic Echocardiogram: Date: 04/22/2020 Results: secondary MR Aorta is WNL for age and BSA  1. Left ventricular ejection fraction, by estimation, is 25 to 30%. The  left ventricle has severely decreased function. The left ventricle  demonstrates global hypokinesis. The left ventricular internal cavity size  was moderately dilated. Left  ventricular diastolic parameters are consistent with Grade II diastolic  dysfunction (pseudonormalization). Elevated left atrial pressure. There is  akinesis of the left ventricular, entire inferior wall and inferolateral  wall.   2. Right ventricular systolic function is mildly reduced. The right  ventricular size is normal.   3. Left atrial size was mildly  dilated.   4. Right atrial size was mildly dilated.   5. The mitral valve is grossly normal. Mild to moderate mitral valve  regurgitation. No evidence of mitral stenosis.   6. The aortic valve has an indeterminant number of cusps. There is mild  thickening of the aortic valve. Aortic valve regurgitation is not  visualized. No aortic stenosis is present.   7. Aortic dilatation noted. There is borderline dilatation of the aortic  root, measuring 38 mm.   Left/Right Heart Catheterizations: Date: 10/08/2019 Results: 1st Diag lesion is 60% stenosed. Prox LAD to Mid LAD lesion is 10% stenosed. 2nd Diag lesion is 100% stenosed. 1st Mrg lesion is 100% stenosed. Mid Cx to Dist Cx lesion is 100% stenosed. Prox RCA to Mid RCA lesion is 100% stenosed. Dist RCA lesion is 70% stenosed. 2nd Mrg lesion is 60% stenosed. Post intervention,  there is a 0% residual stenosis. A drug-eluting stent was successfully placed using a STENT RESOLUTE ONYX [id number].   1.  Significant underlying three-vessel coronary artery disease with patent stents in the LAD and diagonal with moderate in-stent restenosis in the diagonal stent.  Chronically occluded RCA stents with right to right bridging and left-to-right collaterals.  Diffuse small vessel disease.    The mid/distal left circumflex which was significantly diseased recently is now completely occluded which is the likely culprit for myocardial infarction.  This supplies relatively small size OM 3 which has moderate diffuse atherosclerosis. 2.  Left ventricular angiography was not performed.  EF was 25 to 30% by echo. 3.  Severely elevated left ventricular end-diastolic pressure at 33 mmHg 4.  Successful angioplasty and drug-eluting stent placement to the left circumflex.   Recommendations: I elected to intervene on the left circumflex given that troponin was still rising and the patient with residual chest pain. Dual antiplatelet therapy for at least 1 year. Resume  heart failure medications. Resume furosemide as the patient is significantly volume overloaded. Possible discharge home tomorrow.     Recent Labs: 04/21/2020: ALT 14 04/23/2020: Hemoglobin 15.9; Platelets 227 04/24/2020: BUN 18; Creatinine, Ser 0.99; Magnesium 2.3; Potassium 3.8; Sodium 140  Recent Lipid Panel    Component Value Date/Time   CHOL 155 10/10/2019 0420   TRIG 162 (H) 10/10/2019 0420   HDL 48 10/10/2019 0420   CHOLHDL 3.2 10/10/2019 0420   VLDL 32 10/10/2019 0420   LDLCALC 75 10/10/2019 0420    Physical Exam:    VS:  There were no vitals taken for this visit.    Wt Readings from Last 3 Encounters:  11/17/20 215 lb 2 oz (97.6 kg)  08/20/20 221 lb (100.2 kg)  07/07/20 220 lb (99.8 kg)     GEN: *** Well nourished, well developed in no acute distress HEENT: Normal NECK: No JVD; No carotid bruits LYMPHATICS: No lymphadenopathy CARDIAC: ***RRR, no murmurs, rubs, gallops RESPIRATORY:  Clear to auscultation without rales, wheezing or rhonchi  ABDOMEN: Soft, non-tender, non-distended MUSCULOSKELETAL:  No edema; No deformity  SKIN: Warm and dry NEUROLOGIC:  Alert and oriented x 3 PSYCHIATRIC:  Normal affect   ASSESSMENT:    No diagnosis found. PLAN:    CAD s/p CTO RCA with collaterals, 10/2019 PCI of Lcx, LAD stenting with ISR; HLD HFrEF ~ 25% Ischemic cardiomyopathy  Heart Failure *** Ejection Fraction  - NYHA class ***, Stage ***, ***volemic, etiology from *** DM and HTN OSA *** CPAP, Frequent PVCs in 2021 Hx of prior stroke.   - continue ASA 81 mg; Continue *** until *** - continue statin, goal LDL < 70 - continue BB - continue nitrates; *** PDEi - continue ACEi   {Are you ordering a CV Procedure (e.g. stress test, cath, DCCV, TEE, etc)?   Press F2        :[id number]}    Medication Adjustments/Labs and Tests Ordered: Current medicines are reviewed at length with the patient today.  Concerns regarding medicines are outlined above.  No orders of  the defined types were placed in this encounter.  No orders of the defined types were placed in this encounter.   There are no Patient Instructions on file for this visit.   Signed, Glen Constant, MD  01/26/2021 10:48 AM    [facility]

## 2021-01-27 ENCOUNTER — Ambulatory Visit: Payer: Self-pay | Admitting: Internal Medicine

## 2021-02-18 ENCOUNTER — Ambulatory Visit: Payer: Self-pay | Admitting: Family

## 2021-03-02 ENCOUNTER — Ambulatory Visit: Payer: Self-pay | Admitting: Family

## 2021-03-03 ENCOUNTER — Ambulatory Visit: Payer: Self-pay | Admitting: Family

## 2021-03-23 ENCOUNTER — Encounter: Payer: Self-pay | Admitting: Family

## 2021-03-23 ENCOUNTER — Telehealth: Payer: Self-pay | Admitting: Family

## 2021-03-23 ENCOUNTER — Ambulatory Visit: Payer: No Typology Code available for payment source | Attending: Family | Admitting: Family

## 2021-03-23 ENCOUNTER — Other Ambulatory Visit: Payer: Self-pay

## 2021-03-23 VITALS — BP 169/97 | HR 67 | Resp 18 | Ht 66.0 in | Wt 207.0 lb

## 2021-03-23 DIAGNOSIS — I5022 Chronic systolic (congestive) heart failure: Secondary | ICD-10-CM

## 2021-03-23 NOTE — Progress Notes (Signed)
Patient ID: Glen Flemings., male    DOB: 1956/04/04, 65 y.o.   MRN: [id number]   [name] is a 65 y/o male with a history of CAD, HTN, hyperlipidemia, hypokalemia, previous tobacco use and chronic heart failure.   Echo report from 05/04/2019 reviewed and showed an EF of 40-45%. Echo report from 04/22/20 reviewed and showed an EF of 25-30% along with mild/moderate Glen. Echo report from 10/07/19 reviewed and showed an EF of 25-30%. Echo report from 05/04/2019 reviewed and showed an EF of 40-45% along with mild Glen. Echo report from 06/07/15 reviewed and showed an EF of 20-25% along with moderate Glen and mild TR.   He was in the ED on 01/08/21 for nephrolithiasis and was discharged back home.   He presents today for follow up of his heart failure symptoms. He reports he is SOB and needing three pillow to sleep on.   During his medication review he advised he takes entresto "maybe twice a week". He advised he does not take any of his other medication. Attempted to elicit cause of medication non-compliance, he advised that " I don't have to take this" and stood up and walked out of the clinic.   Past Medical History:  Diagnosis Date   Anginal pain (HCC)    Arthritis    CAD (coronary artery disease)    a. s/p MI with LAD and Diag stenting @ [name];  b. 06/2015 Cath: LAD 9m/d ISR, 100 RCA (ISR) w/ L->R collats, otw mod nonobs dzs-->Med Rx; c. 08/2019 Cath: LM nl, LAD 10p/m ISR, D1 20, D2 100, RI min irregs, LCX 3m/d, OM1 100, OM2 50, RCA 100p, 70d. RPDA fills via collats from LAD. EF 25-35%-->Med Rx; d. 10/2019 NSTEMI/Cath: LCX now 100 (2.75x15 Resolute Onyx DES), otw stable compared to 08/2019.   Chronic combined systolic and diastolic CHF (congestive heart failure) (HCC)    a. 06/2015 Echo: EF 20-25%, Gr3 DD; b. 10/2019 Echo: EF 25-30%, glob HK, sev inf/infapical HK. Mod dil LA.   Dyspnea    Essential hypertension    Hyperlipidemia    Hypokalemia    a. 06/2015 in setting of diuresis.   Ischemic  cardiomyopathy    a. 2011 EF 45% ([name]);  b. 06/2015 Echo: EF 20-25%; c. 04/2019 Echo: EF 40-45%; d. 08/2019 LV gram: EF 25-35%; e. 10/2019 Echo: EF 25-30%.   PVC's (premature ventricular contractions)    a. 09/2019 Zio (3 days): Avg hr 70, 4 runs NSVT, 5 runs SVT, rare PACs, frequent PVCs w/ 11.8% burden.   Sleep apnea    Stroke/Right temporal lobe infarction [facility])    a. 10/2019 MRI brain: 1cm acute ischemic nonhemorrhagic R temporal lobe infarct. Age-related cerebral atrophy w/ moderate chronic small vessel ischemic dzs.   Type 2 diabetes mellitus with hyperglycemia (HCC) 10/08/2019   Past Surgical History:  Procedure Laterality Date   BACK SURGERY  04/2019   CARDIAC CATHETERIZATION N/A 06/10/2015   Procedure: Left Heart Cath;  Surgeon: Iran Ouch, MD;  Location: [facility] INVASIVE CV LAB;  Service: Cardiovascular;  Laterality: N/A;   CORONARY STENT INTERVENTION N/A 10/08/2019   Procedure: CORONARY STENT INTERVENTION;  Surgeon: Iran Ouch, MD;  Location: [facility] INVASIVE CV LAB;  Service: Cardiovascular;  Laterality: N/A;   CORONARY STENT PLACEMENT     LEFT HEART CATH AND CORONARY ANGIOGRAPHY N/A 10/08/2019   Procedure: LEFT HEART CATH AND CORONARY ANGIOGRAPHY poss pci;  Surgeon: Iran Ouch, MD;  Location: [facility] INVASIVE CV LAB;  Service: Cardiovascular;  Laterality: N/A;   RIGHT/LEFT HEART CATH AND CORONARY ANGIOGRAPHY N/A 08/20/2019   Procedure: RIGHT/LEFT HEART CATH AND CORONARY ANGIOGRAPHY;  Surgeon: Iran Ouch, MD;  Location: [facility] INVASIVE CV LAB;  Service: Cardiovascular;  Laterality: N/A;   Family History  Problem Relation Age of Onset   Coronary artery disease Mother    Diabetes Mother    Coronary artery disease Father    Social History   Tobacco Use   Smoking status: Former    Types: Cigarettes    Quit date: 06/10/2011    Years since quitting: 9.7   Smokeless tobacco: Never  Substance Use Topics   Alcohol use: No    Alcohol/week: 0.0 standard drinks   Allergies   Allergen Reactions   Contrast Media [Iodinated Diagnostic Agents] Shortness Of Breath   Iohexol Shortness Of Breath     Onset Date: [id number]    Atorvastatin Other (See Comments)    Myalgias    Hydrocodone Itching   Morphine And Related Other (See Comments)    Lost control    Penicillins Other (See Comments)    Unknown reaction Did it involve swelling of the face/tongue/throat, SOB, or low BP? Unknown Did it involve sudden or severe rash/hives, skin peeling, or any reaction on the inside of your mouth or nose? Unknown Did you need to seek medical attention at a hospital or doctor's office? Yes When did it last happen?      Childhood allergy  If all above answers are "NO", may proceed with cephalosporin use.    Rosuvastatin Other (See Comments)    Myalgias    Prior to Admission medications   Medication Sig Start Date End Date Taking? Authorizing Provider  acetaminophen (TYLENOL) 500 MG tablet Take 1,000 mg by mouth at bedtime as needed. At bedtime and as needed.   Yes [provider]  sacubitril-valsartan (ENTRESTO) 49-51 MG Take 1 tablet by mouth 2 (two) times daily.   Yes [provider]  aspirin 81 MG chewable tablet Chew 1 tablet (81 mg total) by mouth daily. Patient not taking: Reported on 03/23/2021 06/11/15   Auburn Bilberry, MD  carvedilol (COREG) 3.125 MG tablet Take 1 tablet (3.125 mg total) by mouth 2 (two) times daily with a meal. Patient not taking: Reported on 03/23/2021 07/07/20   Delma Freeze, FNP  clopidogrel (PLAVIX) 75 MG tablet Take 1 tablet (75 mg total) by mouth daily. Patient not taking: Reported on 03/23/2021 07/07/20   Delma Freeze, FNP  ezetimibe (ZETIA) 10 MG tablet Take 1 tablet (10 mg total) by mouth daily. Patient not taking: Reported on 03/23/2021 04/25/20   Marrion Coy, MD  furosemide (LASIX) 40 MG tablet Take 1 tablet (40 mg total) by mouth daily as needed. Patient not taking: Reported on 03/23/2021 07/07/20   Delma Freeze, FNP   isosorbide mononitrate (IMDUR) 30 MG 24 hr tablet Take 0.5 tablets (15 mg total) by mouth daily. Patient not taking: Reported on 03/23/2021 07/07/20   Delma Freeze, FNP  nitroGLYCERIN (NITROSTAT) 0.4 MG SL tablet Place 1 tablet (0.4 mg total) under the tongue every 5 (five) minutes as needed for chest pain. Patient not taking: Reported on 03/23/2021 11/17/20   Clarisa Kindred A, FNP   Vitals:   03/23/21 1012  BP: (!) 169/97  Pulse: 67  Resp: 18  SpO2: 98%  Weight: 207 lb (93.9 kg)  Height: 5\' 6"  (1.676 m)   Wt Readings from Last 3 Encounters:  03/23/21 207 lb (  93.9 kg)  11/17/20 215 lb 2 oz (97.6 kg)  08/20/20 221 lb (100.2 kg)   Lab Results  Component Value Date   CREATININE 0.99 04/24/2020   CREATININE 0.85 04/23/2020   CREATININE 0.78 04/22/2020

## 2021-03-23 NOTE — Telephone Encounter (Addendum)
Patient left AMA while waiting on provider after student was discussing medications and how patient is taking or not taking medications.    Jahmiya Guidotti, NT

## 2021-06-17 ENCOUNTER — Emergency Department: Payer: No Typology Code available for payment source

## 2021-06-17 ENCOUNTER — Inpatient Hospital Stay
Admission: EM | Admit: 2021-06-17 | Discharge: 2021-06-20 | DRG: 065 | Disposition: A | Discharge status: Home / Self Care | Payer: No Typology Code available for payment source | Attending: Internal Medicine | Admitting: Internal Medicine

## 2021-06-17 ENCOUNTER — Other Ambulatory Visit: Payer: Self-pay

## 2021-06-17 ENCOUNTER — Observation Stay: Payer: No Typology Code available for payment source

## 2021-06-17 DIAGNOSIS — I25110 Atherosclerotic heart disease of native coronary artery with unstable angina pectoris: Secondary | ICD-10-CM

## 2021-06-17 DIAGNOSIS — M79676 Pain in unspecified toe(s): Secondary | ICD-10-CM

## 2021-06-17 DIAGNOSIS — I10 Essential (primary) hypertension: Secondary | ICD-10-CM | POA: Diagnosis present

## 2021-06-17 DIAGNOSIS — Z20822 Contact with and (suspected) exposure to covid-19: Secondary | ICD-10-CM | POA: Diagnosis present

## 2021-06-17 DIAGNOSIS — Z955 Presence of coronary angioplasty implant and graft: Secondary | ICD-10-CM

## 2021-06-17 DIAGNOSIS — Z9114 Patient's other noncompliance with medication regimen: Secondary | ICD-10-CM

## 2021-06-17 DIAGNOSIS — Z888 Allergy status to other drugs, medicaments and biological substances status: Secondary | ICD-10-CM

## 2021-06-17 DIAGNOSIS — Z91199 Patient's noncompliance with other medical treatment and regimen due to unspecified reason: Secondary | ICD-10-CM

## 2021-06-17 DIAGNOSIS — G4733 Obstructive sleep apnea (adult) (pediatric): Secondary | ICD-10-CM | POA: Diagnosis present

## 2021-06-17 DIAGNOSIS — Z79899 Other long term (current) drug therapy: Secondary | ICD-10-CM

## 2021-06-17 DIAGNOSIS — I255 Ischemic cardiomyopathy: Secondary | ICD-10-CM | POA: Diagnosis present

## 2021-06-17 DIAGNOSIS — E1165 Type 2 diabetes mellitus with hyperglycemia: Secondary | ICD-10-CM | POA: Diagnosis present

## 2021-06-17 DIAGNOSIS — Z6836 Body mass index (BMI) 36.0-36.9, adult: Secondary | ICD-10-CM

## 2021-06-17 DIAGNOSIS — Z7982 Long term (current) use of aspirin: Secondary | ICD-10-CM

## 2021-06-17 DIAGNOSIS — E669 Obesity, unspecified: Secondary | ICD-10-CM | POA: Diagnosis present

## 2021-06-17 DIAGNOSIS — E876 Hypokalemia: Secondary | ICD-10-CM | POA: Diagnosis present

## 2021-06-17 DIAGNOSIS — I6381 Other cerebral infarction due to occlusion or stenosis of small artery: Secondary | ICD-10-CM | POA: Diagnosis not present

## 2021-06-17 DIAGNOSIS — Z881 Allergy status to other antibiotic agents status: Secondary | ICD-10-CM

## 2021-06-17 DIAGNOSIS — Z91041 Radiographic dye allergy status: Secondary | ICD-10-CM

## 2021-06-17 DIAGNOSIS — R55 Syncope and collapse: Secondary | ICD-10-CM | POA: Diagnosis not present

## 2021-06-17 DIAGNOSIS — I252 Old myocardial infarction: Secondary | ICD-10-CM

## 2021-06-17 DIAGNOSIS — E785 Hyperlipidemia, unspecified: Secondary | ICD-10-CM | POA: Diagnosis present

## 2021-06-17 DIAGNOSIS — Z880 Allergy status to penicillin: Secondary | ICD-10-CM

## 2021-06-17 DIAGNOSIS — B356 Tinea cruris: Secondary | ICD-10-CM | POA: Diagnosis present

## 2021-06-17 DIAGNOSIS — I110 Hypertensive heart disease with heart failure: Secondary | ICD-10-CM | POA: Diagnosis present

## 2021-06-17 DIAGNOSIS — Z8249 Family history of ischemic heart disease and other diseases of the circulatory system: Secondary | ICD-10-CM

## 2021-06-17 DIAGNOSIS — Z87891 Personal history of nicotine dependence: Secondary | ICD-10-CM

## 2021-06-17 DIAGNOSIS — R200 Anesthesia of skin: Secondary | ICD-10-CM

## 2021-06-17 DIAGNOSIS — I5042 Chronic combined systolic (congestive) and diastolic (congestive) heart failure: Secondary | ICD-10-CM | POA: Diagnosis present

## 2021-06-17 DIAGNOSIS — E782 Mixed hyperlipidemia: Secondary | ICD-10-CM

## 2021-06-17 DIAGNOSIS — Z8673 Personal history of transient ischemic attack (TIA), and cerebral infarction without residual deficits: Secondary | ICD-10-CM

## 2021-06-17 DIAGNOSIS — I509 Heart failure, unspecified: Secondary | ICD-10-CM

## 2021-06-17 DIAGNOSIS — M79644 Pain in right finger(s): Secondary | ICD-10-CM

## 2021-06-17 DIAGNOSIS — Z23 Encounter for immunization: Secondary | ICD-10-CM

## 2021-06-17 DIAGNOSIS — G459 Transient cerebral ischemic attack, unspecified: Secondary | ICD-10-CM

## 2021-06-17 DIAGNOSIS — I2510 Atherosclerotic heart disease of native coronary artery without angina pectoris: Secondary | ICD-10-CM | POA: Diagnosis present

## 2021-06-17 DIAGNOSIS — I639 Cerebral infarction, unspecified: Secondary | ICD-10-CM | POA: Diagnosis present

## 2021-06-17 DIAGNOSIS — Z7902 Long term (current) use of antithrombotics/antiplatelets: Secondary | ICD-10-CM

## 2021-06-17 DIAGNOSIS — M1990 Unspecified osteoarthritis, unspecified site: Secondary | ICD-10-CM | POA: Diagnosis present

## 2021-06-17 DIAGNOSIS — Z885 Allergy status to narcotic agent status: Secondary | ICD-10-CM

## 2021-06-17 LAB — CBC WITH DIFFERENTIAL/PLATELET
Abs Immature Granulocytes: 0.02 10*3/uL (ref 0.00–0.07)
Basophils Absolute: 0.1 10*3/uL (ref 0.0–0.1)
Basophils Relative: 1 %
Eosinophils Absolute: 0.3 10*3/uL (ref 0.0–0.5)
Eosinophils Relative: 3 %
HCT: 47.3 % (ref 39.0–52.0)
Hemoglobin: 16.5 g/dL (ref 13.0–17.0)
Immature Granulocytes: 0 %
Lymphocytes Relative: 25 %
Lymphs Abs: 2.3 10*3/uL (ref 0.7–4.0)
MCH: 30.2 pg (ref 26.0–34.0)
MCHC: 34.9 g/dL (ref 30.0–36.0)
MCV: 86.5 fL (ref 80.0–100.0)
Monocytes Absolute: 1 10*3/uL (ref 0.1–1.0)
Monocytes Relative: 11 %
Neutro Abs: 5.5 10*3/uL (ref 1.7–7.7)
Neutrophils Relative %: 60 %
Platelets: 232 10*3/uL (ref 150–400)
RBC: 5.47 MIL/uL (ref 4.22–5.81)
RDW: 14.6 % (ref 11.5–15.5)
WBC: 9.1 10*3/uL (ref 4.0–10.5)
nRBC: 0 % (ref 0.0–0.2)

## 2021-06-17 LAB — COMPREHENSIVE METABOLIC PANEL
ALT: 16 U/L (ref 0–44)
AST: 22 U/L (ref 15–41)
Albumin: 4 g/dL (ref 3.5–5.0)
Alkaline Phosphatase: 77 U/L (ref 38–126)
Anion gap: 7 (ref 5–15)
BUN: 13 mg/dL (ref 8–23)
CO2: 24 mmol/L (ref 22–32)
Calcium: 9.1 mg/dL (ref 8.9–10.3)
Chloride: 105 mmol/L (ref 98–111)
Creatinine, Ser: 0.81 mg/dL (ref 0.61–1.24)
GFR, Estimated: >60 mL/min (ref >60)
Glucose, Bld: 179 mg/dL — ABNORMAL HIGH (ref 70–99)
Potassium: 3.5 mmol/L (ref 3.5–5.1)
Sodium: 136 mmol/L (ref 135–145)
Total Bilirubin: 1.1 mg/dL (ref 0.3–1.2)
Total Protein: 7.7 g/dL (ref 6.5–8.1)

## 2021-06-17 LAB — URINE DRUG SCREEN, QUALITATIVE (ARMC ONLY)
Amphetamines, Ur Screen: NOT DETECTED
Barbiturates, Ur Screen: NOT DETECTED
Benzodiazepine, Ur Scrn: NOT DETECTED
Cannabinoid 50 Ng, Ur ~~LOC~~: NOT DETECTED
Cocaine Metabolite,Ur ~~LOC~~: NOT DETECTED
MDMA (Ecstasy)Ur Screen: NOT DETECTED
Methadone Scn, Ur: NOT DETECTED
Opiate, Ur Screen: NOT DETECTED
Phencyclidine (PCP) Ur S: NOT DETECTED
Tricyclic, Ur Screen: NOT DETECTED

## 2021-06-17 LAB — RESP PANEL BY RT-PCR (FLU A&B, COVID) ARPGX2
Influenza A by PCR: NEGATIVE
Influenza B by PCR: NEGATIVE
SARS Coronavirus 2 by RT PCR: NEGATIVE

## 2021-06-17 LAB — TROPONIN I (HIGH SENSITIVITY)
Troponin I (High Sensitivity): 35 ng/L — ABNORMAL HIGH (ref <18)
Troponin I (High Sensitivity): 34 ng/L — ABNORMAL HIGH (ref <18)

## 2021-06-17 LAB — D-DIMER, QUANTITATIVE: D-Dimer, Quant: 0.43 ug/mL-FEU (ref 0.00–0.50)

## 2021-06-17 LAB — PROTIME-INR
INR: 1 (ref 0.8–1.2)
Prothrombin Time: 13.6 seconds (ref 11.4–15.2)

## 2021-06-17 LAB — ETHANOL: Alcohol, Ethyl (B): <10 mg/dL (ref <10)

## 2021-06-17 MED ORDER — ONDANSETRON HCL 4 MG PO TABS
4.0000 mg | ORAL_TABLET | Freq: Four times a day (QID) | ORAL | Status: DC | PRN
  Administered 2021-06-18 (×2): 4 mg via ORAL
  Filled 2021-06-17 (×2): qty 1

## 2021-06-17 MED ORDER — ACETAMINOPHEN 650 MG RE SUPP: 650.0000 mg | Freq: Four times a day (QID) | RECTAL | Status: DC | PRN

## 2021-06-17 MED ORDER — FENTANYL CITRATE PF 50 MCG/ML IJ SOSY
50.0000 ug | PREFILLED_SYRINGE | Freq: Once | INTRAMUSCULAR | Status: AC
  Administered 2021-06-17: 18:00:00 50 ug via INTRAVENOUS
  Filled 2021-06-17: qty 1

## 2021-06-17 MED ORDER — HEPARIN SODIUM (PORCINE) 5000 UNIT/ML IJ SOLN
5000.0000 [IU] | Freq: Three times a day (TID) | INTRAMUSCULAR | Status: DC
  Administered 2021-06-18 – 2021-06-20 (×6): 5000 [IU] via SUBCUTANEOUS
  Filled 2021-06-17 (×7): qty 1

## 2021-06-17 MED ORDER — ACETAMINOPHEN 325 MG PO TABS
650.0000 mg | ORAL_TABLET | Freq: Four times a day (QID) | ORAL | Status: DC | PRN
  Administered 2021-06-18: 650 mg via ORAL
  Filled 2021-06-17: qty 2

## 2021-06-17 MED ORDER — SODIUM CHLORIDE 0.9 % IV BOLUS
1000.0000 mL | Freq: Once | INTRAVENOUS | Status: AC
  Administered 2021-06-17: 17:00:00 1000 mL via INTRAVENOUS

## 2021-06-17 MED ORDER — ONDANSETRON HCL 4 MG/2ML IJ SOLN
4.0000 mg | Freq: Once | INTRAMUSCULAR | Status: AC
  Administered 2021-06-17: 18:00:00 4 mg via INTRAVENOUS
  Filled 2021-06-17: qty 2

## 2021-06-17 MED ORDER — ONDANSETRON HCL 4 MG/2ML IJ SOLN
4.0000 mg | Freq: Four times a day (QID) | INTRAMUSCULAR | Status: DC | PRN
  Administered 2021-06-19: 4 mg via INTRAVENOUS
  Filled 2021-06-17: qty 2
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
## 2021-06-17 NOTE — ED Notes (Signed)
Pt provided a turkey sandwich.
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
## 2021-06-17 NOTE — ED Notes (Signed)
Pt refused temperature and medication at this time.

## 2021-06-17 NOTE — ED Notes (Signed)
Pt is comfortable in the bed at this time.
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
## 2021-06-17 NOTE — ED Notes (Signed)
Patient transported to CT 

## 2021-06-17 NOTE — ED Notes (Signed)
Pt asleep in the bed at this time.  

## 2021-06-18 ENCOUNTER — Observation Stay: Payer: No Typology Code available for payment source

## 2021-06-18 LAB — CBC
HCT: 40.4 % (ref 39.0–52.0)
Hemoglobin: 14 g/dL (ref 13.0–17.0)
MCH: 30.2 pg (ref 26.0–34.0)
MCHC: 34.7 g/dL (ref 30.0–36.0)
MCV: 87.1 fL (ref 80.0–100.0)
Platelets: 204 10*3/uL (ref 150–400)
RBC: 4.64 MIL/uL (ref 4.22–5.81)
RDW: 14.6 % (ref 11.5–15.5)
WBC: 8.8 10*3/uL (ref 4.0–10.5)
nRBC: 0 % (ref 0.0–0.2)

## 2021-06-18 LAB — LIPID PANEL
Cholesterol: 136 mg/dL (ref 0–200)
HDL: 35 mg/dL — ABNORMAL LOW (ref >40)
LDL Cholesterol: 64 mg/dL (ref 0–99)
Total CHOL/HDL Ratio: 3.9 RATIO
Triglycerides: 186 mg/dL — ABNORMAL HIGH (ref <150)
VLDL: 37 mg/dL (ref 0–40)

## 2021-06-18 LAB — BASIC METABOLIC PANEL
Anion gap: 7 (ref 5–15)
BUN: 13 mg/dL (ref 8–23)
CO2: 26 mmol/L (ref 22–32)
Calcium: 8.5 mg/dL — ABNORMAL LOW (ref 8.9–10.3)
Chloride: 105 mmol/L (ref 98–111)
Creatinine, Ser: 0.85 mg/dL (ref 0.61–1.24)
GFR, Estimated: >60 mL/min (ref >60)
Glucose, Bld: 253 mg/dL — ABNORMAL HIGH (ref 70–99)
Potassium: 3.9 mmol/L (ref 3.5–5.1)
Sodium: 138 mmol/L (ref 135–145)

## 2021-06-18 LAB — HIV ANTIBODY (ROUTINE TESTING W REFLEX): HIV Screen 4th Generation wRfx: NONREACTIVE

## 2021-06-18 MED ORDER — ASPIRIN 81 MG PO CHEW
81.0000 mg | CHEWABLE_TABLET | Freq: Every day | ORAL | Status: DC
  Administered 2021-06-18 – 2021-06-20 (×3): 81 mg via ORAL
  Filled 2021-06-18 (×3): qty 1

## 2021-06-18 MED ORDER — STROKE: EARLY STAGES OF RECOVERY BOOK: Freq: Once | Status: AC

## 2021-06-18 MED ORDER — ISOSORBIDE MONONITRATE ER 30 MG PO TB24
15.0000 mg | ORAL_TABLET | Freq: Every day | ORAL | Status: DC
  Administered 2021-06-18 – 2021-06-20 (×3): 15 mg via ORAL
  Filled 2021-06-18 (×3): qty 1

## 2021-06-18 MED ORDER — NITROGLYCERIN 0.4 MG SL SUBL: 0.4000 mg | SUBLINGUAL_TABLET | SUBLINGUAL | Status: DC | PRN

## 2021-06-18 MED ORDER — CLOPIDOGREL BISULFATE 75 MG PO TABS
75.0000 mg | ORAL_TABLET | Freq: Every day | ORAL | Status: DC
  Administered 2021-06-18 – 2021-06-19 (×2): 75 mg via ORAL
  Filled 2021-06-18 (×2): qty 1

## 2021-06-18 MED ORDER — ACETAMINOPHEN 500 MG PO TABS
1000.0000 mg | ORAL_TABLET | Freq: Four times a day (QID) | ORAL | Status: DC | PRN
  Administered 2021-06-18 – 2021-06-19 (×3): 1000 mg via ORAL
  Filled 2021-06-18 (×4): qty 2

## 2021-06-18 MED ORDER — INFLUENZA VAC A&B SA ADJ QUAD 0.5 ML IM PRSY
0.5000 mL | PREFILLED_SYRINGE | INTRAMUSCULAR | Status: AC
  Administered 2021-06-20: 0.5 mL via INTRAMUSCULAR
  Filled 2021-06-18: qty 0.5

## 2021-06-18 MED ORDER — NYSTATIN 100000 UNIT/GM EX POWD
Freq: Two times a day (BID) | CUTANEOUS | Status: DC
  Filled 2021-06-18 (×3): qty 15

## 2021-06-18 MED ORDER — FENTANYL CITRATE PF 50 MCG/ML IJ SOSY
12.5000 ug | PREFILLED_SYRINGE | INTRAMUSCULAR | Status: AC | PRN
  Administered 2021-06-18 (×3): 12.5 ug via INTRAVENOUS
  Filled 2021-06-18 (×3): qty 1

## 2021-06-18 MED ORDER — CARVEDILOL 3.125 MG PO TABS
3.1250 mg | ORAL_TABLET | Freq: Two times a day (BID) | ORAL | Status: DC
  Administered 2021-06-18 – 2021-06-20 (×3): 3.125 mg via ORAL
  Filled 2021-06-18 (×4): qty 1

## 2021-06-18 MED ORDER — DIPHENHYDRAMINE HCL 25 MG PO CAPS: 25.0000 mg | ORAL_CAPSULE | Freq: Four times a day (QID) | ORAL | Status: DC | PRN

## 2021-06-18 MED ORDER — SACUBITRIL-VALSARTAN 49-51 MG PO TABS
1.0000 | ORAL_TABLET | Freq: Two times a day (BID) | ORAL | Status: DC
  Administered 2021-06-19 – 2021-06-20 (×3): 1 via ORAL
  Filled 2021-06-18 (×4): qty 1

## 2021-06-18 MED ORDER — EZETIMIBE 10 MG PO TABS
10.0000 mg | ORAL_TABLET | Freq: Every day | ORAL | Status: DC
  Administered 2021-06-18 – 2021-06-20 (×3): 10 mg via ORAL
  Filled 2021-06-18 (×3): qty 1

## 2021-06-18 NOTE — Progress Notes (Signed)
SLP Cancellation Note  Patient Details Name: [name]. MRN: [id number] DOB: 1956/04/01   Cancelled treatment:       Reason Eval/Treat Not Completed: Patient at procedure or test/unavailable (Pt OTF for carotid US.)  Clyde Canterbury, M.S., CCC-SLP Speech-Language Pathologist [facility] [phone] Arnette Felts)  Woodroe Chen 06/18/2021, 9:00 AM
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
## 2021-06-18 NOTE — Progress Notes (Signed)
OT Cancellation Note  Patient Details Name: [name]. MRN: [id number] DOB: [birth year]   Cancelled Treatment:    Reason Eval/Treat Not Completed: OT screened, no needs identified, will sign off. Order received, chart reviewed. Pt near baseline functional independence, denies needs. No skilled OT needs identified. Will sign off. Please re-consult if additional needs arise.   Kathie Dike, M.S. OTR/L  06/18/21, 3:03 PM  ascom [phone]

## 2021-06-18 NOTE — TOC Progression Note (Signed)
Transition of Care (TOC) - Progression Note    Patient Details  Name: [name]. MRN: [id number] Date of Birth: 03-Aug-1955  Transition of [facility]) CM/SW Contact  Marlowe Sax, RN Phone Number: 06/18/2021, 10:03 AM  Clinical Narrative:   Patient lives alone and is independent at baseline, still drives, On O2 acute, TOC to monitor for needs and assist in DC planning         Expected Discharge Plan and Services                                                 Social Determinants of Health (SDOH) Interventions    Readmission Risk Interventions No flowsheet data found.

## 2021-06-18 NOTE — Plan of Care (Signed)
°  Problem: Education: Goal: Ability to demonstrate management of disease process will improve Outcome: Progressing Goal: Ability to verbalize understanding of medication therapies will improve Outcome: Progressing Goal: Individualized Educational Video(s) Outcome: Progressing   Problem: Activity: Goal: Capacity to carry out activities will improve Outcome: Progressing   Problem: Cardiac: Goal: Ability to achieve and maintain adequate cardiopulmonary perfusion will improve Outcome: Progressing   Problem: Education: Goal: Knowledge of General Education information will improve Description: Including pain rating scale, medication(s)/side effects and non-pharmacologic comfort measures Outcome: Progressing   Problem: Activity: Goal: Risk for activity intolerance will decrease Outcome: Progressing   Problem: Safety: Goal: Ability to remain free from injury will improve Outcome: Progressing

## 2021-06-19 ENCOUNTER — Observation Stay (HOSPITAL_COMMUNITY)
Admit: 2021-06-19 | Discharge: 2021-06-19 | Disposition: A | Discharge status: Home / Self Care | Payer: No Typology Code available for payment source | Attending: Internal Medicine | Admitting: Internal Medicine

## 2021-06-19 ENCOUNTER — Observation Stay: Payer: No Typology Code available for payment source

## 2021-06-19 ENCOUNTER — Other Ambulatory Visit: Payer: Self-pay

## 2021-06-19 DIAGNOSIS — I110 Hypertensive heart disease with heart failure: Secondary | ICD-10-CM | POA: Diagnosis present

## 2021-06-19 DIAGNOSIS — I2510 Atherosclerotic heart disease of native coronary artery without angina pectoris: Secondary | ICD-10-CM | POA: Diagnosis present

## 2021-06-19 DIAGNOSIS — E669 Obesity, unspecified: Secondary | ICD-10-CM | POA: Diagnosis present

## 2021-06-19 DIAGNOSIS — E876 Hypokalemia: Secondary | ICD-10-CM | POA: Diagnosis present

## 2021-06-19 DIAGNOSIS — Z20822 Contact with and (suspected) exposure to covid-19: Secondary | ICD-10-CM | POA: Diagnosis present

## 2021-06-19 DIAGNOSIS — E1165 Type 2 diabetes mellitus with hyperglycemia: Secondary | ICD-10-CM | POA: Diagnosis present

## 2021-06-19 DIAGNOSIS — Z79899 Other long term (current) drug therapy: Secondary | ICD-10-CM | POA: Diagnosis not present

## 2021-06-19 DIAGNOSIS — I639 Cerebral infarction, unspecified: Secondary | ICD-10-CM | POA: Diagnosis present

## 2021-06-19 DIAGNOSIS — I255 Ischemic cardiomyopathy: Secondary | ICD-10-CM | POA: Diagnosis present

## 2021-06-19 DIAGNOSIS — R55 Syncope and collapse: Secondary | ICD-10-CM

## 2021-06-19 DIAGNOSIS — I6381 Other cerebral infarction due to occlusion or stenosis of small artery: Secondary | ICD-10-CM | POA: Diagnosis present

## 2021-06-19 DIAGNOSIS — Z91199 Patient's noncompliance with other medical treatment and regimen due to unspecified reason: Secondary | ICD-10-CM | POA: Diagnosis not present

## 2021-06-19 DIAGNOSIS — I5042 Chronic combined systolic (congestive) and diastolic (congestive) heart failure: Secondary | ICD-10-CM | POA: Diagnosis present

## 2021-06-19 DIAGNOSIS — G4733 Obstructive sleep apnea (adult) (pediatric): Secondary | ICD-10-CM | POA: Diagnosis present

## 2021-06-19 DIAGNOSIS — E785 Hyperlipidemia, unspecified: Secondary | ICD-10-CM | POA: Diagnosis present

## 2021-06-19 DIAGNOSIS — Z6836 Body mass index (BMI) 36.0-36.9, adult: Secondary | ICD-10-CM | POA: Diagnosis not present

## 2021-06-19 DIAGNOSIS — M1990 Unspecified osteoarthritis, unspecified site: Secondary | ICD-10-CM | POA: Diagnosis present

## 2021-06-19 DIAGNOSIS — Z23 Encounter for immunization: Secondary | ICD-10-CM | POA: Diagnosis not present

## 2021-06-19 DIAGNOSIS — B356 Tinea cruris: Secondary | ICD-10-CM | POA: Diagnosis present

## 2021-06-19 DIAGNOSIS — Z8673 Personal history of transient ischemic attack (TIA), and cerebral infarction without residual deficits: Secondary | ICD-10-CM | POA: Diagnosis not present

## 2021-06-19 DIAGNOSIS — Z91041 Radiographic dye allergy status: Secondary | ICD-10-CM | POA: Diagnosis not present

## 2021-06-19 DIAGNOSIS — I252 Old myocardial infarction: Secondary | ICD-10-CM | POA: Diagnosis not present

## 2021-06-19 DIAGNOSIS — Z9114 Patient's other noncompliance with medication regimen: Secondary | ICD-10-CM | POA: Diagnosis not present

## 2021-06-19 DIAGNOSIS — Z8249 Family history of ischemic heart disease and other diseases of the circulatory system: Secondary | ICD-10-CM | POA: Diagnosis not present

## 2021-06-19 DIAGNOSIS — Z87891 Personal history of nicotine dependence: Secondary | ICD-10-CM | POA: Diagnosis not present

## 2021-06-19 LAB — ECHOCARDIOGRAM COMPLETE
AR max vel: 2.34 cm2
AV Area VTI: 2.64 cm2
AV Area mean vel: 2.32 cm2
AV Mean grad: 2 mmHg
AV Peak grad: 3.8 mmHg
Ao pk vel: 0.98 m/s
Area-P 1/2: 3.65 cm2
Height: 66 in
MV VTI: 1.68 cm2
S' Lateral: 3.7 cm
Weight: 3541.47 oz

## 2021-06-19 LAB — HEMOGLOBIN A1C
Hgb A1c MFr Bld: 7.4 % — ABNORMAL HIGH (ref 4.8–5.6)
Mean Plasma Glucose: 166 mg/dL

## 2021-06-19 LAB — C-REACTIVE PROTEIN: CRP: 1.1 mg/dL — ABNORMAL HIGH (ref <1.0)

## 2021-06-19 LAB — SEDIMENTATION RATE: Sed Rate: 11 mm/hr (ref 0–20)

## 2021-06-19 MED ORDER — KETOROLAC TROMETHAMINE 15 MG/ML IJ SOLN
15.0000 mg | Freq: Four times a day (QID) | INTRAMUSCULAR | Status: DC | PRN
  Administered 2021-06-19 – 2021-06-20 (×2): 15 mg via INTRAVENOUS
  Filled 2021-06-19 (×2): qty 1

## 2021-06-19 MED ORDER — DIPHENHYDRAMINE HCL 50 MG/ML IJ SOLN
25.0000 mg | Freq: Once | INTRAMUSCULAR | Status: AC
  Administered 2021-06-19: 25 mg via INTRAVENOUS
  Filled 2021-06-19: qty 1

## 2021-06-19 MED ORDER — SACUBITRIL-VALSARTAN 49-51 MG PO TABS: 1.0000 | ORAL_TABLET | Freq: Two times a day (BID) | ORAL | 2 refills | Status: DC

## 2021-06-19 MED ORDER — SACUBITRIL-VALSARTAN 49-51 MG PO TABS
1.0000 | ORAL_TABLET | Freq: Two times a day (BID) | ORAL | 2 refills | Status: DC
Start: 2021-06-19 — End: 2021-12-31
  Filled 2021-06-19: qty 60, 30d supply, fill #0

## 2021-06-19 MED ORDER — FUROSEMIDE 40 MG PO TABS
40.0000 mg | ORAL_TABLET | Freq: Every day | ORAL | 3 refills | Status: DC | PRN
  Filled 2021-06-19: qty 30, 30d supply, fill #0

## 2021-06-19 MED ORDER — ASPIRIN 81 MG PO CHEW: 81.0000 mg | CHEWABLE_TABLET | Freq: Every day | ORAL | 0 refills | Status: DC

## 2021-06-19 MED ORDER — CLOPIDOGREL BISULFATE 75 MG PO TABS: 75.0000 mg | ORAL_TABLET | Freq: Every day | ORAL | 3 refills | Status: DC

## 2021-06-19 MED ORDER — FENTANYL CITRATE PF 50 MCG/ML IJ SOSY
12.5000 ug | PREFILLED_SYRINGE | Freq: Once | INTRAMUSCULAR | Status: AC
  Administered 2021-06-19: 12.5 ug via INTRAVENOUS
  Filled 2021-06-19: qty 1

## 2021-06-19 MED ORDER — CARVEDILOL 3.125 MG PO TABS: 3.1250 mg | ORAL_TABLET | Freq: Two times a day (BID) | ORAL | 3 refills | Status: DC

## 2021-06-19 MED ORDER — EZETIMIBE 10 MG PO TABS: 10.0000 mg | ORAL_TABLET | Freq: Every day | ORAL | 0 refills | Status: DC

## 2021-06-19 MED ORDER — KETOROLAC TROMETHAMINE 30 MG/ML IJ SOLN
30.0000 mg | Freq: Once | INTRAMUSCULAR | Status: AC
  Administered 2021-06-19: 30 mg via INTRAVENOUS
  Filled 2021-06-19: qty 1

## 2021-06-19 MED ORDER — ISOSORBIDE MONONITRATE ER 30 MG PO TB24
15.0000 mg | ORAL_TABLET | Freq: Every day | ORAL | 3 refills | Status: DC
  Filled 2021-06-19: qty 15, 30d supply, fill #0

## 2021-06-19 MED ORDER — FUROSEMIDE 40 MG PO TABS: 40.0000 mg | ORAL_TABLET | Freq: Every day | ORAL | 3 refills | Status: DC | PRN

## 2021-06-19 MED ORDER — ISOSORBIDE MONONITRATE ER 30 MG PO TB24
15.0000 mg | ORAL_TABLET | Freq: Every day | ORAL | 3 refills | Status: DC
Start: 2021-06-19 — End: 2021-06-19

## 2021-06-19 MED ORDER — CARVEDILOL 3.125 MG PO TABS
3.1250 mg | ORAL_TABLET | Freq: Two times a day (BID) | ORAL | 3 refills | Status: DC
  Filled 2021-06-19: qty 60, 30d supply, fill #0

## 2021-06-19 MED ORDER — CLOPIDOGREL BISULFATE 75 MG PO TABS
75.0000 mg | ORAL_TABLET | Freq: Every day | ORAL | 3 refills | Status: DC
  Filled 2021-06-19: qty 30, 30d supply, fill #0

## 2021-06-19 NOTE — TOC Progression Note (Signed)
Transition of Care (TOC) - Progression Note    Patient Details  Name: [name]. MRN: [id number] Date of Birth: 06/19/1956  Transition of Care [facility]) CM/SW [place], RN Phone Number: 06/19/2021, 12:35 PM  Clinical Narrative:    Met with the patient at the bedside, he lives alone and drives himself to appointments, he does not have insurance he will be able to [facility], I explained where they are located to the patient, he stated that he is independent at home and does not need PT, he stated that he doe snot need DME, no additional needs        Expected Discharge Plan and Services           Expected Discharge Date: 06/19/21                                     Social Determinants of Health (SDOH) Interventions    Readmission Risk Interventions No flowsheet data found.

## 2021-06-19 NOTE — Progress Notes (Signed)
*  PRELIMINARY RESULTS* Echocardiogram 2D Echocardiogram has been performed.  [name] 06/19/2021, 7:50 AM

## 2021-06-19 NOTE — Plan of Care (Signed)
°  Problem: Cardiac: Goal: Ability to achieve and maintain adequate cardiopulmonary perfusion will improve Outcome: Progressing   Problem: Education: Goal: Knowledge of General Education information will improve Description: Including pain rating scale, medication(s)/side effects and non-pharmacologic comfort measures Outcome: Progressing   Problem: Activity: Goal: Risk for activity intolerance will decrease Outcome: Progressing   Problem: Safety: Goal: Ability to remain free from injury will improve Outcome: Progressing

## 2021-06-19 NOTE — Progress Notes (Signed)
SLP Cancellation Note  Patient Details Name: Parks Czajkowski. MRN: [id number] DOB: [birth year]   Cancelled treatment:       Reason Eval/Treat Not Completed: SLP screened, no needs identified, will sign off (chart reviewed. consulted NSG; met w/ pt).  Pt denied any difficulty swallowing and is currently on a regular diet; tolerates swallowing pills w/ water per NSG. Pt conversed in conversation w/out overt expressive/receptive deficits noted; pt denied any speech-language deficits. Speech clear. Pt c/o Headache - this was relayed to the Nurse. Water provided as pt also c/o dry mouth.   No further skilled ST services indicated as pt appears at his baseline. Pt agreed. NSG to reconsult if any change in status while admitted.        Orinda Kenner, MS, CCC-SLP Speech Language Pathologist Rehab Services [phone] [facility] 06/19/2021, 12:27 PM

## 2021-06-19 NOTE — Progress Notes (Signed)
Vitals taken by Hazelynn Mckenny °
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
## 2021-06-19 NOTE — Progress Notes (Signed)
RN made Dr. Jerral Ralph aware of patient's heart rate of 58 and asked MD if coreg should be held. MD gave order to go ahead and give evening dose of coreg.

## 2021-06-20 DIAGNOSIS — I63311 Cerebral infarction due to thrombosis of right middle cerebral artery: Secondary | ICD-10-CM

## 2021-06-20 MED ORDER — METFORMIN HCL 500 MG PO TABS
500.0000 mg | ORAL_TABLET | Freq: Two times a day (BID) | ORAL | 11 refills | Status: DC
  Filled 2021-06-20: qty 60, 30d supply, fill #0

## 2021-06-20 NOTE — Discharge Summary (Signed)
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  in effect (meaning this test can be used) for the duration of the COVID-19 declaration under Section 564(b)(1) of the Act, 21 U.S.C.section 360bbb-3(b)(1), unless the authorization is terminated  or revoked sooner.       Influenza A by PCR NEGATIVE NEGATIVE Final   Influenza B by PCR NEGATIVE NEGATIVE Final    Comment: (NOTE) The Xpert Xpress SARS-CoV-2/FLU/RSV plus assay is intended as an aid in the diagnosis of influenza from Nasopharyngeal swab specimens and should not be used as a sole basis for treatment. Nasal washings and aspirates are unacceptable for Xpert Xpress SARS-CoV-2/FLU/RSV testing.  Fact Sheet for Patients: EntrepreneurPulse.com.au  Fact Sheet for Healthcare Providers: IncredibleEmployment.be  This test is not yet approved or cleared by the [place] FDA and has been authorized for detection and/or diagnosis of  SARS-CoV-2 by FDA under an Emergency Use Authorization (EUA). This EUA will remain in effect (meaning this test can be used) for the duration of the COVID-19 declaration under Section 564(b)(1) of the Act, 21 U.S.C. section 360bbb-3(b)(1), unless the authorization is terminated or revoked.  Performed at [facility], [address] [place], [place] [address]      Labs: BNP (last 3 results) No results for input(s): BNP in the last 8760 hours. Basic Metabolic Panel: Recent Labs  Lab 06/17/21 1645 06/18/21 0429  NA 136 138  K 3.5 3.9  CL 105 105  CO2 24 26  GLUCOSE 179* 253*  BUN 13 13  CREATININE 0.81 0.85  CALCIUM 9.1 8.5*   Liver Function Tests: Recent Labs  Lab 06/17/21 1645  AST 22  ALT 16  ALKPHOS 77  BILITOT 1.1  PROT 7.7  ALBUMIN 4.0   No results for input(s): LIPASE, AMYLASE in the last 168 hours. No results for input(s): AMMONIA in the last 168 hours. CBC: Recent Labs  Lab 06/17/21 1645 06/18/21 0429  WBC 9.1 8.8  NEUTROABS 5.5  --   HGB 16.5 14.0  HCT 47.3 40.4  MCV 86.5 87.1  PLT 232 204   Cardiac Enzymes: No results for input(s): CKTOTAL, CKMB, CKMBINDEX, TROPONINI in the last 168 hours. BNP: Invalid input(s): POCBNP CBG: No results for input(s): GLUCAP in the last 168 hours. D-Dimer Recent Labs    06/17/21 1645  DDIMER 0.43   Hgb A1c Recent Labs    06/18/21 0429  HGBA1C 7.4*   Lipid Profile Recent Labs    06/18/21 0429  CHOL 136  HDL 35*  LDLCALC 64  TRIG 186*  CHOLHDL 3.9   Thyroid function studies No results for input(s): TSH, T4TOTAL,  T3FREE, THYROIDAB in the last 72 hours.  Invalid input(s): FREET3 Anemia work up No results for input(s): VITAMINB12, FOLATE, FERRITIN, TIBC, IRON, RETICCTPCT in the last 72 hours. Urinalysis No results found for: COLORURINE, APPEARANCEUR, [place], [place], GLUCOSEU, [place], [place], [place], PROTEINUR, UROBILINOGEN, NITRITE, LEUKOCYTESUR Sepsis Labs Invalid  input(s): PROCALCITONIN,  WBC,  LACTICIDVEN Microbiology Recent Results (from the past 240 hour(s))  Resp Panel by RT-PCR (Flu A&B, Covid) Nasopharyngeal Swab     Status: None   Collection Time: 06/17/21  4:45 PM   Specimen: Nasopharyngeal Swab; Nasopharyngeal(NP) swabs in vial transport medium  Result Value Ref Range Status   SARS Coronavirus 2 by RT PCR NEGATIVE NEGATIVE Final    Comment: (NOTE) SARS-CoV-2 target nucleic acids are NOT DETECTED.  The SARS-CoV-2 RNA is generally detectable in upper respiratory specimens during the acute phase of infection. The lowest concentration of SARS-CoV-2 viral copies this assay can detect is 138 copies/mL. A negative result does not preclude SARS-Cov-2 infection and should not be used as the sole basis for treatment or other patient management decisions. A negative result may occur with  improper specimen collection/handling, submission of specimen other than nasopharyngeal swab, presence of viral mutation(s) within the areas targeted by this assay, and inadequate number of viral copies(<138 copies/mL). A negative result must be combined with clinical observations, patient history, and epidemiological information. The expected result is Negative.  Fact Sheet for Patients:  EntrepreneurPulse.com.au  Fact Sheet for Healthcare Providers:  IncredibleEmployment.be  This test is no t yet approved or cleared by the [place] FDA and  has been authorized for detection and/or diagnosis of SARS-CoV-2 by FDA under an Emergency Use Authorization (EUA). This EUA will remain  in effect (meaning this test can be used) for the duration of the COVID-19 declaration under Section 564(b)(1) of the Act, 21 U.S.C.section 360bbb-3(b)(1), unless the authorization is terminated  or revoked sooner.       Influenza A by PCR NEGATIVE NEGATIVE Final   Influenza B by PCR NEGATIVE NEGATIVE Final    Comment: (NOTE) The Xpert  Xpress SARS-CoV-2/FLU/RSV plus assay is intended as an aid in the diagnosis of influenza from Nasopharyngeal swab specimens and should not be used as a sole basis for treatment. Nasal washings and aspirates are unacceptable for Xpert Xpress SARS-CoV-2/FLU/RSV testing.  Fact Sheet for Patients: EntrepreneurPulse.com.au  Fact Sheet for Healthcare Providers: IncredibleEmployment.be  This test is not yet approved or cleared by the [place] FDA and has been authorized for detection and/or diagnosis of SARS-CoV-2 by FDA under an Emergency Use Authorization (EUA). This EUA will remain in effect (meaning this test can be used) for the duration of the COVID-19 declaration under Section 564(b)(1) of the Act, 21 U.S.C. section 360bbb-3(b)(1), unless the authorization is terminated or revoked.  Performed at [facility], [address] [place], [place] [address]      Time coordinating discharge: 40 minutes  SIGNED:   Barb Merino, MD  [facility] 06/20/2021, 9:25 AM

## 2021-06-20 NOTE — Progress Notes (Signed)
CRP 1.1, ESR normal.  I have been in contact with Dr. Sloan Leiter, and at this point no temporal artery biopsy required.  Will sign off.  Please recontact as needed.

## 2021-06-20 NOTE — Plan of Care (Signed)
Problem: Ischemic Stroke/TIA Tissue Perfusion: °Goal: Complications of ischemic stroke/TIA will be minimized °Outcome: Completed/Met °  °

## 2021-06-20 NOTE — Progress Notes (Signed)
Nsg Discharge Note  Admit Date:  06/17/2021 Discharge date: 06/20/2021   [name]. to be D/C'd Home per MD order.  AVS completed.  Patient/caregiver able to verbalize understanding.  Discharge Medication: Allergies as of 06/20/2021       Reactions   Contrast Media [iodinated Diagnostic Agents] Shortness Of Breath   Iohexol Shortness Of Breath    Onset Date: 12878676   Atorvastatin Other (See Comments)   Myalgias   Hydrocodone Itching   Morphine And Related Other (See Comments)   Lost control    Penicillins Other (See Comments)   Unknown reaction Did it involve swelling of the face/tongue/throat, SOB, or low BP? Unknown Did it involve sudden or severe rash/hives, skin peeling, or any reaction on the inside of your mouth or nose? Unknown Did you need to seek medical attention at a hospital or doctor's office? Yes When did it last happen?      Childhood allergy  If all above answers are "NO", may proceed with cephalosporin use.   Rosuvastatin Other (See Comments)   Myalgias        Medication List     STOP taking these medications    nitroGLYCERIN 0.4 MG SL tablet Commonly known as: NITROSTAT   tamsulosin 0.4 MG Caps capsule Commonly known as: FLOMAX       TAKE these medications    acetaminophen 500 MG tablet Commonly known as: TYLENOL Take 1,000 mg by mouth at bedtime as needed. At bedtime and as needed.   aspirin 81 MG chewable tablet Chew 1 tablet (81 mg total) by mouth daily.   carvedilol 3.125 MG tablet Commonly known as: COREG Take 1 tablet (3.125 mg total) by mouth 2 (two) times daily with a meal.   clopidogrel 75 MG tablet Commonly known as: PLAVIX Take 1 tablet (75 mg total) by mouth once daily.   ezetimibe 10 MG tablet Commonly known as: ZETIA Take 1 tablet (10 mg total) by mouth daily.   furosemide 40 MG tablet Commonly known as: LASIX Take 1 tablet (40 mg total) by mouth once daily as needed.   isosorbide mononitrate 30 MG 24 hr  tablet Commonly known as: IMDUR Take (1/2) tablet (15 mg total) by mouth once daily.   metFORMIN 500 MG tablet Commonly known as: Glucophage Take 1 tablet (500 mg total) by mouth 2 (two) times daily with a meal.   sacubitril-valsartan 49-51 MG Commonly known as: ENTRESTO Take 1 tablet by mouth 2 (two) times daily.        Discharge Assessment: Vitals:   06/20/21 0435 06/20/21 0744  BP: (!) 106/50 136/82  Pulse: 63 (!) 59  Resp: 18 18  Temp: 97.9 F (36.6 C) 98 F (36.7 C)  SpO2: 93% 94%   Skin clean, dry and intact without evidence of skin break down, no evidence of skin tears noted. IV catheter discontinued intact. Site without signs and symptoms of complications - no redness or edema noted at insertion site, patient denies c/o pain - only slight tenderness at site.  Dressing with slight pressure applied.  D/c Instructions-Education: Discharge instructions given to patient/family with verbalized understanding. D/c education completed with patient/family including follow up instructions, medication list, d/c activities limitations if indicated, with other d/c instructions as indicated by MD - patient able to verbalize understanding, all questions fully answered. Patient instructed to return to ED, call 911, or call MD for any changes in condition.  Patient escorted via WC, and D/C home via private auto.  Bartlett Enke, Tilford Pillar, RN 06/20/2021 10:13 AM

## 2021-06-22 ENCOUNTER — Other Ambulatory Visit: Payer: Self-pay
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
## 2021-07-07 ENCOUNTER — Ambulatory Visit: Payer: Self-pay | Admitting: Family

## 2021-07-13 ENCOUNTER — Telehealth: Payer: Self-pay | Admitting: Family

## 2021-07-13 NOTE — Telephone Encounter (Signed)
Reminded patient of his uncomming follow up appointment and to bring proof of income and sign novart[employer] application for Entresto as its about to expire.   Takeila Thayne, NT

## 2021-07-16 ENCOUNTER — Encounter: Payer: Self-pay | Admitting: Family

## 2021-07-16 ENCOUNTER — Ambulatory Visit (HOSPITAL_BASED_OUTPATIENT_CLINIC_OR_DEPARTMENT_OTHER): Payer: Self-pay | Admitting: Family

## 2021-07-16 ENCOUNTER — Other Ambulatory Visit: Payer: Self-pay

## 2021-07-16 ENCOUNTER — Other Ambulatory Visit: Payer: Self-pay | Admitting: Family

## 2021-07-16 ENCOUNTER — Ambulatory Visit
Admission: RE | Admit: 2021-07-16 | Discharge: 2021-07-16 | Disposition: A | Discharge status: Home / Self Care | Payer: Self-pay | Source: Ambulatory Visit | Attending: Family | Admitting: Family

## 2021-07-16 VITALS — BP 120/79 | HR 68 | Resp 20 | Ht 66.0 in | Wt 218.5 lb

## 2021-07-16 DIAGNOSIS — I10 Essential (primary) hypertension: Secondary | ICD-10-CM

## 2021-07-16 DIAGNOSIS — I2510 Atherosclerotic heart disease of native coronary artery without angina pectoris: Secondary | ICD-10-CM | POA: Insufficient documentation

## 2021-07-16 DIAGNOSIS — E785 Hyperlipidemia, unspecified: Secondary | ICD-10-CM | POA: Insufficient documentation

## 2021-07-16 DIAGNOSIS — I110 Hypertensive heart disease with heart failure: Secondary | ICD-10-CM | POA: Insufficient documentation

## 2021-07-16 DIAGNOSIS — Z8673 Personal history of transient ischemic attack (TIA), and cerebral infarction without residual deficits: Secondary | ICD-10-CM | POA: Insufficient documentation

## 2021-07-16 DIAGNOSIS — I252 Old myocardial infarction: Secondary | ICD-10-CM | POA: Insufficient documentation

## 2021-07-16 DIAGNOSIS — I5023 Acute on chronic systolic (congestive) heart failure: Secondary | ICD-10-CM | POA: Insufficient documentation

## 2021-07-16 DIAGNOSIS — Z87891 Personal history of nicotine dependence: Secondary | ICD-10-CM | POA: Insufficient documentation

## 2021-07-16 DIAGNOSIS — G459 Transient cerebral ischemic attack, unspecified: Secondary | ICD-10-CM

## 2021-07-16 DIAGNOSIS — E875 Hyperkalemia: Secondary | ICD-10-CM | POA: Insufficient documentation

## 2021-07-16 DIAGNOSIS — I25119 Atherosclerotic heart disease of native coronary artery with unspecified angina pectoris: Secondary | ICD-10-CM

## 2021-07-16 LAB — BASIC METABOLIC PANEL
Anion gap: 10 (ref 5–15)
BUN: 26 mg/dL — ABNORMAL HIGH (ref 8–23)
CO2: 21 mmol/L — ABNORMAL LOW (ref 22–32)
Calcium: 9.2 mg/dL (ref 8.9–10.3)
Chloride: 106 mmol/L (ref 98–111)
Creatinine, Ser: 0.87 mg/dL (ref 0.61–1.24)
GFR, Estimated: >60 mL/min (ref >60)
Glucose, Bld: 217 mg/dL — ABNORMAL HIGH (ref 70–99)
Potassium: 3.9 mmol/L (ref 3.5–5.1)
Sodium: 137 mmol/L (ref 135–145)

## 2021-07-16 LAB — BRAIN NATRIURETIC PEPTIDE: B Natriuretic Peptide: 53.9 pg/mL (ref 0.0–100.0)

## 2021-07-16 MED ORDER — FUROSEMIDE 10 MG/ML IJ SOLN
80.0000 mg | Freq: Once | INTRAMUSCULAR | Status: AC
  Administered 2021-07-16: 80 mg via INTRAVENOUS

## 2021-07-16 MED ORDER — FUROSEMIDE 10 MG/ML IJ SOLN
INTRAMUSCULAR | Status: AC
  Filled 2021-07-16: qty 8

## 2021-07-16 MED ORDER — SODIUM CHLORIDE FLUSH 0.9 % IV SOLN
INTRAVENOUS | Status: AC
  Filled 2021-07-16: qty 10

## 2021-07-16 MED ORDER — POTASSIUM CHLORIDE CRYS ER 20 MEQ PO TBCR
40.0000 meq | EXTENDED_RELEASE_TABLET | Freq: Once | ORAL | Status: AC
  Administered 2021-07-16: 40 meq via ORAL

## 2021-07-16 MED ORDER — POTASSIUM CHLORIDE CRYS ER 20 MEQ PO TBCR
EXTENDED_RELEASE_TABLET | ORAL | Status: AC
  Filled 2021-07-16: qty 2

## 2021-07-16 NOTE — Patient Instructions (Addendum)
Resume weighing daily and call for an overnight weight gain of 3 pounds or more or a weekly weight gain of more than 5 pounds.     [facility] will be moving around the corner to [address] mid-February. Our phone number will remain the same    Bring medication bottles to every visit every time.     Decrease your fluid intake to 60-64 ounces of fluid daily.
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
## 2021-07-21 ENCOUNTER — Encounter: Payer: Self-pay | Admitting: Family

## 2021-07-21 ENCOUNTER — Ambulatory Visit (HOSPITAL_BASED_OUTPATIENT_CLINIC_OR_DEPARTMENT_OTHER): Payer: Self-pay | Admitting: Family

## 2021-07-21 ENCOUNTER — Other Ambulatory Visit
Admission: RE | Admit: 2021-07-21 | Discharge: 2021-07-21 | Disposition: A | Discharge status: Home / Self Care | Payer: Self-pay | Source: Ambulatory Visit | Attending: Family | Admitting: Family

## 2021-07-21 ENCOUNTER — Other Ambulatory Visit: Payer: Self-pay

## 2021-07-21 ENCOUNTER — Encounter: Payer: Self-pay | Admitting: Pharmacist

## 2021-07-21 VITALS — BP 152/66 | HR 57 | Resp 18 | Ht 66.0 in | Wt 220.0 lb

## 2021-07-21 DIAGNOSIS — E785 Hyperlipidemia, unspecified: Secondary | ICD-10-CM | POA: Insufficient documentation

## 2021-07-21 DIAGNOSIS — I10 Essential (primary) hypertension: Secondary | ICD-10-CM

## 2021-07-21 DIAGNOSIS — I25119 Atherosclerotic heart disease of native coronary artery with unspecified angina pectoris: Secondary | ICD-10-CM

## 2021-07-21 DIAGNOSIS — Z87891 Personal history of nicotine dependence: Secondary | ICD-10-CM | POA: Insufficient documentation

## 2021-07-21 DIAGNOSIS — I252 Old myocardial infarction: Secondary | ICD-10-CM | POA: Insufficient documentation

## 2021-07-21 DIAGNOSIS — G459 Transient cerebral ischemic attack, unspecified: Secondary | ICD-10-CM

## 2021-07-21 DIAGNOSIS — I5022 Chronic systolic (congestive) heart failure: Secondary | ICD-10-CM

## 2021-07-21 DIAGNOSIS — I2510 Atherosclerotic heart disease of native coronary artery without angina pectoris: Secondary | ICD-10-CM | POA: Insufficient documentation

## 2021-07-21 DIAGNOSIS — E876 Hypokalemia: Secondary | ICD-10-CM | POA: Insufficient documentation

## 2021-07-21 DIAGNOSIS — I081 Rheumatic disorders of both mitral and tricuspid valves: Secondary | ICD-10-CM | POA: Insufficient documentation

## 2021-07-21 DIAGNOSIS — Z8673 Personal history of transient ischemic attack (TIA), and cerebral infarction without residual deficits: Secondary | ICD-10-CM | POA: Insufficient documentation

## 2021-07-21 DIAGNOSIS — Z79899 Other long term (current) drug therapy: Secondary | ICD-10-CM | POA: Insufficient documentation

## 2021-07-21 DIAGNOSIS — I110 Hypertensive heart disease with heart failure: Secondary | ICD-10-CM | POA: Insufficient documentation

## 2021-07-21 LAB — BASIC METABOLIC PANEL
Anion gap: 8 (ref 5–15)
BUN: 19 mg/dL (ref 8–23)
CO2: 23 mmol/L (ref 22–32)
Calcium: 8.8 mg/dL — ABNORMAL LOW (ref 8.9–10.3)
Chloride: 103 mmol/L (ref 98–111)
Creatinine, Ser: 0.89 mg/dL (ref 0.61–1.24)
GFR, Estimated: >60 mL/min (ref >60)
Glucose, Bld: 218 mg/dL — ABNORMAL HIGH (ref 70–99)
Potassium: 3.8 mmol/L (ref 3.5–5.1)
Sodium: 134 mmol/L — ABNORMAL LOW (ref 135–145)

## 2021-07-21 MED ORDER — EMPAGLIFLOZIN 10 MG PO TABS: 10.0000 mg | ORAL_TABLET | Freq: Every day | ORAL | 5 refills | Status: DC

## 2021-07-21 NOTE — Patient Instructions (Addendum)
Continue weighing daily and call for an overnight weight gain of 3 pounds or more or a weekly weight gain of more than 5 pounds.    Begin jardiance as 1 tablet once a day. When you start this, decrease your furosemide to 1 tablet in the morning and 1 tablet in the afternoon

## 2021-07-27 ENCOUNTER — Telehealth: Payer: Self-pay | Admitting: Family

## 2021-07-27 NOTE — Telephone Encounter (Signed)
Notified patient that he was approved for Jardiance patient assistance until may 2023 unless financial situation changes.    Adriauna Campton, NT

## 2021-07-28 ENCOUNTER — Other Ambulatory Visit: Payer: Self-pay

## 2021-07-28 ENCOUNTER — Encounter: Payer: Self-pay | Admitting: Cardiovascular Disease

## 2021-07-28 ENCOUNTER — Ambulatory Visit (INDEPENDENT_AMBULATORY_CARE_PROVIDER_SITE_OTHER): Payer: Self-pay | Admitting: Cardiovascular Disease

## 2021-07-28 VITALS — BP 138/78 | HR 70 | Ht 66.0 in | Wt 220.4 lb

## 2021-07-28 DIAGNOSIS — E785 Hyperlipidemia, unspecified: Secondary | ICD-10-CM

## 2021-07-28 DIAGNOSIS — I10 Essential (primary) hypertension: Secondary | ICD-10-CM

## 2021-07-28 DIAGNOSIS — I2510 Atherosclerotic heart disease of native coronary artery without angina pectoris: Secondary | ICD-10-CM

## 2021-07-28 DIAGNOSIS — I5022 Chronic systolic (congestive) heart failure: Secondary | ICD-10-CM

## 2021-07-28 NOTE — Patient Instructions (Signed)
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
## 2021-07-29 NOTE — Addendum Note (Signed)
Addended by: Britt Bottom on: 07/29/2021 02:38 PM   Modules accepted: Orders

## 2021-08-10 ENCOUNTER — Other Ambulatory Visit: Payer: Self-pay

## 2021-08-11 ENCOUNTER — Telehealth: Payer: Self-pay | Admitting: Family

## 2021-08-11 NOTE — Telephone Encounter (Signed)
Reminded patient that at his next appointment with TIna I need him to fill out a patient assistance application for Entresto and bring proof of income.   Volanda Mangine,NT
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
## 2021-08-20 ENCOUNTER — Encounter: Payer: Self-pay | Admitting: Family

## 2021-08-20 ENCOUNTER — Ambulatory Visit: Payer: Self-pay | Attending: Family | Admitting: Family

## 2021-08-20 ENCOUNTER — Other Ambulatory Visit: Payer: Self-pay

## 2021-08-20 ENCOUNTER — Encounter: Payer: Self-pay | Admitting: Pharmacist

## 2021-08-20 VITALS — BP 180/102 | HR 58 | Resp 18 | Ht 66.0 in | Wt 219.4 lb

## 2021-08-20 DIAGNOSIS — I5042 Chronic combined systolic (congestive) and diastolic (congestive) heart failure: Secondary | ICD-10-CM | POA: Insufficient documentation

## 2021-08-20 DIAGNOSIS — E876 Hypokalemia: Secondary | ICD-10-CM | POA: Insufficient documentation

## 2021-08-20 DIAGNOSIS — G459 Transient cerebral ischemic attack, unspecified: Secondary | ICD-10-CM

## 2021-08-20 DIAGNOSIS — Z09 Encounter for follow-up examination after completed treatment for conditions other than malignant neoplasm: Secondary | ICD-10-CM | POA: Insufficient documentation

## 2021-08-20 DIAGNOSIS — G8929 Other chronic pain: Secondary | ICD-10-CM | POA: Insufficient documentation

## 2021-08-20 DIAGNOSIS — Z8673 Personal history of transient ischemic attack (TIA), and cerebral infarction without residual deficits: Secondary | ICD-10-CM | POA: Insufficient documentation

## 2021-08-20 DIAGNOSIS — G4730 Sleep apnea, unspecified: Secondary | ICD-10-CM | POA: Insufficient documentation

## 2021-08-20 DIAGNOSIS — R42 Dizziness and giddiness: Secondary | ICD-10-CM | POA: Insufficient documentation

## 2021-08-20 DIAGNOSIS — R0602 Shortness of breath: Secondary | ICD-10-CM | POA: Insufficient documentation

## 2021-08-20 DIAGNOSIS — Z79899 Other long term (current) drug therapy: Secondary | ICD-10-CM | POA: Insufficient documentation

## 2021-08-20 DIAGNOSIS — I10 Essential (primary) hypertension: Secondary | ICD-10-CM

## 2021-08-20 DIAGNOSIS — I2510 Atherosclerotic heart disease of native coronary artery without angina pectoris: Secondary | ICD-10-CM | POA: Insufficient documentation

## 2021-08-20 DIAGNOSIS — M542 Cervicalgia: Secondary | ICD-10-CM | POA: Insufficient documentation

## 2021-08-20 DIAGNOSIS — E785 Hyperlipidemia, unspecified: Secondary | ICD-10-CM | POA: Insufficient documentation

## 2021-08-20 DIAGNOSIS — Z87891 Personal history of nicotine dependence: Secondary | ICD-10-CM | POA: Insufficient documentation

## 2021-08-20 DIAGNOSIS — I252 Old myocardial infarction: Secondary | ICD-10-CM | POA: Insufficient documentation

## 2021-08-20 DIAGNOSIS — I5022 Chronic systolic (congestive) heart failure: Secondary | ICD-10-CM

## 2021-08-20 DIAGNOSIS — Z7984 Long term (current) use of oral hypoglycemic drugs: Secondary | ICD-10-CM | POA: Insufficient documentation

## 2021-08-20 DIAGNOSIS — Z7901 Long term (current) use of anticoagulants: Secondary | ICD-10-CM | POA: Insufficient documentation

## 2021-08-20 DIAGNOSIS — I25119 Atherosclerotic heart disease of native coronary artery with unspecified angina pectoris: Secondary | ICD-10-CM

## 2021-08-20 DIAGNOSIS — I110 Hypertensive heart disease with heart failure: Secondary | ICD-10-CM | POA: Insufficient documentation

## 2021-08-20 NOTE — Patient Instructions (Addendum)
Continue weighing daily and call for an overnight weight gain of 3 pounds or more or a weekly weight gain of more than 5 pounds.   Resume medications

## 2021-08-20 NOTE — Progress Notes (Signed)
[name] - PHARMACIST COUNSELING NOTE  Guideline-Directed Medical Therapy/Evidence Based Medicine  ACE/ARB/ARNI: Sacubitril-valsartan 49-51 mg twice daily Beta Blocker: Carvedilol 3.125 mg twice daily Aldosterone Antagonist:  none Diuretic: Furosemide 40 mg daily SGLT2i: Empagliflozin 10 mg daily  Adherence Assessment  Do you ever forget to take your medication? [] Yes [x] No  Do you ever skip doses due to side effects? [x] Yes [] No  Do you have trouble affording your medicines? [] Yes [x] No  Are you ever unable to pick up your medication due to transportation difficulties? [] Yes [x] No  Do you ever stop taking your medications because you don't believe they are helping? [x] Yes [] No  Do you check your weight daily? [] Yes [x] No   Adherence strategy: none  Barriers to obtaining medications: patient STOPPED taking ALL meds except for tylenol due to ADR  Vital signs: HR 58, BP 180/102, weight (pounds) 219 lb ECHO: Date 06/19/21, EF 30-35%, notes moderate LVH and mild/moderate MR  BMP Latest Ref Rng & Units 07/21/2021 07/16/2021 06/18/2021  Glucose 70 - 99 mg/dL 218(H) 217(H) 253(H)  BUN 8 - 23 mg/dL 19 26(H) 13  Creatinine 0.61 - 1.24 mg/dL 0.89 0.87 0.85  Sodium 135 - 145 mmol/L 134(L) 137 138  Potassium 3.5 - 5.1 mmol/L 3.8 3.9 3.9  Chloride 98 - 111 mmol/L 103 106 105  CO2 22 - 32 mmol/L 23 21(L) 26  Calcium 8.9 - 10.3 mg/dL 8.8(L) 9.2 8.5(L)    Past Medical History:  Diagnosis Date   Anginal pain (HCC)    Arthritis    CAD (coronary artery disease)    a. s/p MI with LAD and Diag stenting @ [name];  b. 06/2015 Cath: LAD 60m/d ISR, 100 RCA (ISR) w/ L->R collats, otw mod nonobs dzs-->Med Rx; c. 08/2019 Cath: LM nl, LAD 10p/m ISR, D1 20, D2 100, RI min irregs, LCX 39m/d, OM1 100, OM2 50, RCA 100p, 70d. RPDA fills via collats from LAD. EF 25-35%-->Med Rx; d. 10/2019 NSTEMI/Cath: LCX now 100 (2.75x15 Resolute Onyx DES), otw stable compared  to 08/2019.   Chronic combined systolic and diastolic CHF (congestive heart failure) ([name])    a. 06/2015 Echo: EF 20-25%, Gr3 DD; b. 10/2019 Echo: EF 25-30%, glob HK, sev inf/infapical HK. Mod dil LA.   Dyspnea    Essential hypertension    Hyperlipidemia    Hypokalemia    a. 06/2015 in setting of diuresis.   Ischemic cardiomyopathy    a. 2011 EF 45% ([name]);  b. 06/2015 Echo: EF 20-25%; c. 04/2019 Echo: EF 40-45%; d. 08/2019 LV gram: EF 25-35%; e. 10/2019 Echo: EF 25-30%.   PVC's (premature ventricular contractions)    a. 09/2019 Zio (3 days): Avg hr 70, 4 runs NSVT, 5 runs SVT, rare PACs, frequent PVCs w/ 11.8% burden.   Sleep apnea    Stroke/Right temporal lobe infarction [facility])    a. 10/2019 MRI brain: 1cm acute ischemic nonhemorrhagic R temporal lobe infarct. Age-related cerebral atrophy w/ moderate chronic small vessel ischemic dzs.   Type 2 diabetes mellitus with hyperglycemia ([name]) 10/08/2019    ASSESSMENT 66 year old male who presents to the HF clinic for follow up. PMH includes  CAD, HTN, hyperlipidemia, hypokalemia, previous tobacco use and HFrEF. Patient reluctant to take to anyone but [facility] NP. Limited information obtained during medication reconciliation due to difficult communication with patient. Patient stopped taking ALL prescribed medication and taking only Tylenol 1500mg  BID at this time. No receptive to counseling by pharmD either. Expressed he  stopped taking all medication because they made him fill "horrible". After clarification of symptoms requested he expressed they have him fill "like hell", but no description of symptoms provided.   Recent ED Visit (past 6 months): 06/17/21, Syncope  PLAN - patient should limit APAP to max 3000mg  per day - Resume all previously tolerated meds   Time spent: 10 minutes  Darrol Brandenburg Rodriguez-Guzman PharmD, BCPS 08/20/2021 12:52 PM    Current Outpatient Medications:    acetaminophen (TYLENOL) 500 MG tablet, Take 1,500 mg by mouth 2  (two) times daily. At bedtime and as needed., Disp: , Rfl:    carvedilol (COREG) 3.125 MG tablet, Take 1 tablet (3.125 mg total) by mouth 2 (two) times daily with a meal. (Patient not taking: Reported on 08/20/2021), Disp: 180 tablet, Rfl: 3   clopidogrel (PLAVIX) 75 MG tablet, Take 1 tablet (75 mg total) by mouth once daily. (Patient not taking: Reported on 08/20/2021), Disp: 90 tablet, Rfl: 3   colchicine 0.6 MG tablet, Take 0.6 mg by mouth daily as needed., Disp: , Rfl:    empagliflozin (JARDIANCE) 10 MG TABS tablet, Take 1 tablet (10 mg total) by mouth daily before breakfast. (Patient not taking: Reported on 08/20/2021), Disp: 30 tablet, Rfl: 5   ezetimibe (ZETIA) 10 MG tablet, Take 1 tablet (10 mg total) by mouth daily. (Patient not taking: Reported on 08/20/2021), Disp: 30 tablet, Rfl: 0   furosemide (LASIX) 40 MG tablet, Take 1 tablet (40 mg total) by mouth once daily as needed. (Patient not taking: Reported on 08/20/2021), Disp: 90 tablet, Rfl: 3   isosorbide mononitrate (IMDUR) 30 MG 24 hr tablet, Take (1/2) tablet (15 mg total) by mouth once daily. (Patient not taking: Reported on 08/20/2021), Disp: 45 tablet, Rfl: 3   metFORMIN (GLUCOPHAGE) 500 MG tablet, Take 1 tablet (500 mg total) by mouth 2 (two) times daily with a meal. (Patient not taking: Reported on 08/20/2021), Disp: 60 tablet, Rfl: 11   oxymetazoline (AFRIN) 0.05 % nasal spray, Place 1 spray into both nostrils 2 (two) times daily., Disp: , Rfl:    sacubitril-valsartan (ENTRESTO) 49-51 MG, Take 1 tablet by mouth 2 (two) times daily. (Patient not taking: Reported on 08/20/2021), Disp: 60 tablet, Rfl: 2   MEDICATION ADHERENCES TIPS AND STRATEGIES Taking medication as prescribed improves patient outcomes in heart failure (reduces hospitalizations, improves symptoms, increases survival) Side effects of medications can be managed by decreasing doses, switching agents, stopping drugs, or adding additional therapy. Please let someone in the [facility] know if you have having bothersome side effects so we can modify your regimen. Do not alter your medication regimen without talking to [place].  Medication reminders can help patients remember to take drugs on time. If you are missing or forgetting doses you can try linking behaviors, using pill boxes, or an electronic reminder like an alarm on your phone or an app. Some people can also get automated phone calls as medication reminders.

## 2021-08-25 ENCOUNTER — Ambulatory Visit: Payer: Self-pay | Admitting: Neurology

## 2021-09-16 ENCOUNTER — Other Ambulatory Visit: Payer: Self-pay
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
## 2021-09-17 ENCOUNTER — Other Ambulatory Visit: Payer: Self-pay

## 2021-09-17 ENCOUNTER — Encounter: Payer: Self-pay | Admitting: Family

## 2021-09-17 ENCOUNTER — Ambulatory Visit: Payer: Self-pay | Admitting: Family

## 2021-09-17 ENCOUNTER — Ambulatory Visit: Payer: Self-pay | Attending: Family | Admitting: Family

## 2021-09-17 VITALS — BP 88/74 | HR 95 | Ht 66.0 in | Wt 217.4 lb

## 2021-09-17 DIAGNOSIS — Z79899 Other long term (current) drug therapy: Secondary | ICD-10-CM | POA: Insufficient documentation

## 2021-09-17 DIAGNOSIS — I2510 Atherosclerotic heart disease of native coronary artery without angina pectoris: Secondary | ICD-10-CM | POA: Insufficient documentation

## 2021-09-17 DIAGNOSIS — I10 Essential (primary) hypertension: Secondary | ICD-10-CM

## 2021-09-17 DIAGNOSIS — I25119 Atherosclerotic heart disease of native coronary artery with unspecified angina pectoris: Secondary | ICD-10-CM

## 2021-09-17 DIAGNOSIS — I110 Hypertensive heart disease with heart failure: Secondary | ICD-10-CM | POA: Insufficient documentation

## 2021-09-17 DIAGNOSIS — E785 Hyperlipidemia, unspecified: Secondary | ICD-10-CM | POA: Insufficient documentation

## 2021-09-17 DIAGNOSIS — Z87891 Personal history of nicotine dependence: Secondary | ICD-10-CM | POA: Insufficient documentation

## 2021-09-17 DIAGNOSIS — I5022 Chronic systolic (congestive) heart failure: Secondary | ICD-10-CM

## 2021-09-17 DIAGNOSIS — I252 Old myocardial infarction: Secondary | ICD-10-CM | POA: Insufficient documentation
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
## 2021-09-17 NOTE — Patient Instructions (Signed)
Continue weighing daily and call for an overnight weight gain of 3 pounds or more or a weekly weight gain of more than 5 pounds.   If you have voicemail, please make sure your mailbox is cleaned out so that we may leave a message and please make sure to listen to any voicemails.     

## 2021-10-20 ENCOUNTER — Ambulatory Visit: Payer: Self-pay | Attending: Family | Admitting: Family

## 2021-10-20 ENCOUNTER — Encounter: Payer: Self-pay | Admitting: Family

## 2021-10-20 VITALS — BP 148/93 | HR 62 | Resp 18 | Ht 68.0 in | Wt 218.2 lb

## 2021-10-20 DIAGNOSIS — I252 Old myocardial infarction: Secondary | ICD-10-CM | POA: Insufficient documentation

## 2021-10-20 DIAGNOSIS — I5022 Chronic systolic (congestive) heart failure: Secondary | ICD-10-CM | POA: Insufficient documentation

## 2021-10-20 DIAGNOSIS — I25119 Atherosclerotic heart disease of native coronary artery with unspecified angina pectoris: Secondary | ICD-10-CM

## 2021-10-20 DIAGNOSIS — I110 Hypertensive heart disease with heart failure: Secondary | ICD-10-CM | POA: Insufficient documentation

## 2021-10-20 DIAGNOSIS — Z87891 Personal history of nicotine dependence: Secondary | ICD-10-CM | POA: Insufficient documentation

## 2021-10-20 DIAGNOSIS — Z8673 Personal history of transient ischemic attack (TIA), and cerebral infarction without residual deficits: Secondary | ICD-10-CM | POA: Insufficient documentation

## 2021-10-20 DIAGNOSIS — Z79899 Other long term (current) drug therapy: Secondary | ICD-10-CM | POA: Insufficient documentation

## 2021-10-20 DIAGNOSIS — I10 Essential (primary) hypertension: Secondary | ICD-10-CM

## 2021-10-20 DIAGNOSIS — I2510 Atherosclerotic heart disease of native coronary artery without angina pectoris: Secondary | ICD-10-CM | POA: Insufficient documentation

## 2021-10-20 DIAGNOSIS — Z955 Presence of coronary angioplasty implant and graft: Secondary | ICD-10-CM | POA: Insufficient documentation

## 2021-10-20 MED ORDER — NITROGLYCERIN 0.4 MG SL SUBL: 0.4000 mg | SUBLINGUAL_TABLET | SUBLINGUAL | 3 refills | Status: DC | PRN

## 2021-10-20 NOTE — Patient Instructions (Addendum)
Begin weighing daily and call for an overnight weight gain of 3 pounds or more or a weekly weight gain of more than 5 pounds.   If you have voicemail, please make sure your mailbox is cleaned out so that we may leave a message and please make sure to listen to any voicemails.     
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
## 2021-10-22 ENCOUNTER — Ambulatory Visit: Payer: Self-pay | Admitting: Family

## 2021-11-03 ENCOUNTER — Telehealth: Payer: Self-pay | Admitting: Cardiovascular Disease

## 2021-11-03 NOTE — Telephone Encounter (Signed)
? ?  Pre-operative Risk Assessment  ?  ?Patient Name: [name].  ?DOB: January 27, 1956 ?MRN: [id number]  ? ?  ? ?Request for Surgical Clearance   ? ?Procedure:   anterior cervical discectomy and fusion  ? ?Date of Surgery:  Clearance 12/15/21                              ?   ?Surgeon:  Dr Kayleen Memos  ?Surgeon's Group or Practice Name:  [facility]  ?Phone number:  [phone] ?Fax number:  [phone] ?  ?Type of Clearance Requested:   ?- Pharmacy:  Hold Clopidogrel (Plavix) instructions ?  ?Type of Anesthesia:  Not Indicated ?  ?Additional requests/questions:   ? ?Signed, ?Caryl Pina Gerringer   ?11/03/2021, 4:19 PM  ? ?
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
## 2021-11-04 NOTE — Telephone Encounter (Signed)
Follow up appointment scheduled with Ward Givens, NP. Patient agreeable and voiced understanding.  ?

## 2021-11-05 ENCOUNTER — Telehealth: Payer: Self-pay | Admitting: Nurse Practitioner

## 2021-11-05 NOTE — Telephone Encounter (Signed)
There is nothing sooner.

## 2021-11-05 NOTE — Telephone Encounter (Signed)
Pre-op covering staff, is it possible to move up his office visit at least a few days. Dr. Kirke Corin recommended holding Plavix for 7 days and his office visit is currently scheduled within 7 days of his procedure. If we aren't able to do this, patient can be told to go ahead and hold Plavix starting on 12/08/2021 (but he would still need to be seen on 12/09/2021 for medical clearance). ? ?Thank you! ?Jahnavi Muratore ? ? ?

## 2021-11-05 NOTE — Telephone Encounter (Signed)
error 

## 2021-11-05 NOTE — Telephone Encounter (Signed)
? ?  Patient Name: [name].  ?DOB: July 27, 1955 ?MRN: [id number] ? ?Primary Cardiologist: Kathlyn Sacramento, MD ? ?Chart reviewed as part of pre-operative protocol coverage.  ?Patient contacted regarding Plavix hold time and preoperative clearance for upcoming anterior cervical discectomy.  Patient was advised that hold time for Plavix was granted and should begin on June 12/08/2021.  Procedure clearance appointment will be on 12/09/2021.  Patient agreed with plan with no further questions at this time. ? ? ?Mable Fill, Marissa Nestle, NP ?11/05/2021, 2:53 PM ?  ?

## 2021-11-05 NOTE — Telephone Encounter (Signed)
Hold Plavix 7 days before. 

## 2021-12-08 ENCOUNTER — Encounter: Payer: Self-pay | Admitting: *Deleted

## 2021-12-09 ENCOUNTER — Ambulatory Visit: Payer: Self-pay | Admitting: Nurse Practitioner

## 2021-12-14 ENCOUNTER — Ambulatory Visit: Payer: Self-pay | Admitting: Physician Assistant

## 2021-12-23 ENCOUNTER — Emergency Department: Payer: Self-pay

## 2021-12-23 ENCOUNTER — Encounter: Payer: Self-pay | Admitting: Internal Medicine

## 2021-12-23 ENCOUNTER — Inpatient Hospital Stay
Admission: EM | Admit: 2021-12-23 | Discharge: 2021-12-31 | DRG: 286 | Disposition: A | Discharge status: Home / Self Care | Payer: Self-pay | Attending: Internal Medicine | Admitting: Internal Medicine

## 2021-12-23 ENCOUNTER — Other Ambulatory Visit: Payer: Self-pay

## 2021-12-23 ENCOUNTER — Observation Stay (HOSPITAL_COMMUNITY)
Admit: 2021-12-23 | Discharge: 2021-12-23 | Disposition: A | Discharge status: Home / Self Care | Payer: Self-pay | Attending: Internal Medicine | Admitting: Internal Medicine

## 2021-12-23 DIAGNOSIS — Z794 Long term (current) use of insulin: Secondary | ICD-10-CM

## 2021-12-23 DIAGNOSIS — Z7902 Long term (current) use of antithrombotics/antiplatelets: Secondary | ICD-10-CM

## 2021-12-23 DIAGNOSIS — I25110 Atherosclerotic heart disease of native coronary artery with unstable angina pectoris: Secondary | ICD-10-CM

## 2021-12-23 DIAGNOSIS — I110 Hypertensive heart disease with heart failure: Principal | ICD-10-CM | POA: Diagnosis present

## 2021-12-23 DIAGNOSIS — Z833 Family history of diabetes mellitus: Secondary | ICD-10-CM

## 2021-12-23 DIAGNOSIS — I420 Dilated cardiomyopathy: Secondary | ICD-10-CM | POA: Diagnosis present

## 2021-12-23 DIAGNOSIS — Z7982 Long term (current) use of aspirin: Secondary | ICD-10-CM

## 2021-12-23 DIAGNOSIS — I639 Cerebral infarction, unspecified: Secondary | ICD-10-CM | POA: Diagnosis present

## 2021-12-23 DIAGNOSIS — R519 Headache, unspecified: Secondary | ICD-10-CM | POA: Diagnosis present

## 2021-12-23 DIAGNOSIS — E785 Hyperlipidemia, unspecified: Secondary | ICD-10-CM | POA: Diagnosis present

## 2021-12-23 DIAGNOSIS — I2582 Chronic total occlusion of coronary artery: Secondary | ICD-10-CM | POA: Diagnosis present

## 2021-12-23 DIAGNOSIS — I255 Ischemic cardiomyopathy: Secondary | ICD-10-CM | POA: Diagnosis present

## 2021-12-23 DIAGNOSIS — J9601 Acute respiratory failure with hypoxia: Secondary | ICD-10-CM | POA: Diagnosis present

## 2021-12-23 DIAGNOSIS — M1990 Unspecified osteoarthritis, unspecified site: Secondary | ICD-10-CM | POA: Diagnosis present

## 2021-12-23 DIAGNOSIS — I5043 Acute on chronic combined systolic (congestive) and diastolic (congestive) heart failure: Secondary | ICD-10-CM | POA: Diagnosis present

## 2021-12-23 DIAGNOSIS — T82855A Stenosis of coronary artery stent, initial encounter: Secondary | ICD-10-CM | POA: Diagnosis present

## 2021-12-23 DIAGNOSIS — I2510 Atherosclerotic heart disease of native coronary artery without angina pectoris: Secondary | ICD-10-CM | POA: Diagnosis present

## 2021-12-23 DIAGNOSIS — Z91148 Patient's other noncompliance with medication regimen for other reason: Secondary | ICD-10-CM

## 2021-12-23 DIAGNOSIS — E1165 Type 2 diabetes mellitus with hyperglycemia: Secondary | ICD-10-CM | POA: Diagnosis present

## 2021-12-23 DIAGNOSIS — I252 Old myocardial infarction: Secondary | ICD-10-CM

## 2021-12-23 DIAGNOSIS — Z91128 Patient's intentional underdosing of medication regimen for other reason: Secondary | ICD-10-CM

## 2021-12-23 DIAGNOSIS — I5023 Acute on chronic systolic (congestive) heart failure: Secondary | ICD-10-CM | POA: Diagnosis present

## 2021-12-23 DIAGNOSIS — R0602 Shortness of breath: Secondary | ICD-10-CM

## 2021-12-23 DIAGNOSIS — Z91119 Patient's noncompliance with dietary regimen due to unspecified reason: Secondary | ICD-10-CM

## 2021-12-23 DIAGNOSIS — H539 Unspecified visual disturbance: Secondary | ICD-10-CM

## 2021-12-23 DIAGNOSIS — E669 Obesity, unspecified: Secondary | ICD-10-CM | POA: Diagnosis present

## 2021-12-23 DIAGNOSIS — Z955 Presence of coronary angioplasty implant and graft: Secondary | ICD-10-CM

## 2021-12-23 DIAGNOSIS — Z8673 Personal history of transient ischemic attack (TIA), and cerebral infarction without residual deficits: Secondary | ICD-10-CM

## 2021-12-23 DIAGNOSIS — Z880 Allergy status to penicillin: Secondary | ICD-10-CM

## 2021-12-23 DIAGNOSIS — R9431 Abnormal electrocardiogram [ECG] [EKG]: Secondary | ICD-10-CM

## 2021-12-23 DIAGNOSIS — I25118 Atherosclerotic heart disease of native coronary artery with other forms of angina pectoris: Secondary | ICD-10-CM | POA: Diagnosis present

## 2021-12-23 DIAGNOSIS — Z8249 Family history of ischemic heart disease and other diseases of the circulatory system: Secondary | ICD-10-CM

## 2021-12-23 DIAGNOSIS — Z885 Allergy status to narcotic agent status: Secondary | ICD-10-CM

## 2021-12-23 DIAGNOSIS — I5022 Chronic systolic (congestive) heart failure: Secondary | ICD-10-CM | POA: Diagnosis present

## 2021-12-23 DIAGNOSIS — R079 Chest pain, unspecified: Secondary | ICD-10-CM | POA: Diagnosis present

## 2021-12-23 DIAGNOSIS — Z87891 Personal history of nicotine dependence: Secondary | ICD-10-CM

## 2021-12-23 DIAGNOSIS — I10 Essential (primary) hypertension: Secondary | ICD-10-CM | POA: Diagnosis present

## 2021-12-23 DIAGNOSIS — G4730 Sleep apnea, unspecified: Secondary | ICD-10-CM | POA: Diagnosis present

## 2021-12-23 DIAGNOSIS — Z888 Allergy status to other drugs, medicaments and biological substances status: Secondary | ICD-10-CM

## 2021-12-23 DIAGNOSIS — R0789 Other chest pain: Secondary | ICD-10-CM

## 2021-12-23 DIAGNOSIS — Z91041 Radiographic dye allergy status: Secondary | ICD-10-CM

## 2021-12-23 DIAGNOSIS — R072 Precordial pain: Secondary | ICD-10-CM

## 2021-12-23 DIAGNOSIS — I6381 Other cerebral infarction due to occlusion or stenosis of small artery: Secondary | ICD-10-CM | POA: Diagnosis not present

## 2021-12-23 DIAGNOSIS — Z79899 Other long term (current) drug therapy: Secondary | ICD-10-CM

## 2021-12-23 DIAGNOSIS — I509 Heart failure, unspecified: Secondary | ICD-10-CM

## 2021-12-23 HISTORY — DX: Headache, unspecified: R51.9

## 2021-12-23 LAB — CBC
HCT: 47.7 % (ref 39.0–52.0)
Hemoglobin: 16.2 g/dL (ref 13.0–17.0)
MCH: 29.3 pg (ref 26.0–34.0)
MCHC: 34 g/dL (ref 30.0–36.0)
MCV: 86.4 fL (ref 80.0–100.0)
Platelets: 263 10*3/uL (ref 150–400)
RBC: 5.52 MIL/uL (ref 4.22–5.81)
RDW: 14.5 % (ref 11.5–15.5)
WBC: 13.4 10*3/uL — ABNORMAL HIGH (ref 4.0–10.5)
nRBC: 0 % (ref 0.0–0.2)

## 2021-12-23 LAB — TROPONIN I (HIGH SENSITIVITY)
Troponin I (High Sensitivity): 30 ng/L — ABNORMAL HIGH (ref <18)
Troponin I (High Sensitivity): 21 ng/L — ABNORMAL HIGH (ref <18)
Troponin I (High Sensitivity): 22 ng/L — ABNORMAL HIGH (ref <18)
Troponin I (High Sensitivity): 32 ng/L — ABNORMAL HIGH (ref <18)

## 2021-12-23 LAB — COMPREHENSIVE METABOLIC PANEL
ALT: 14 U/L (ref 0–44)
AST: 15 U/L (ref 15–41)
Albumin: 4 g/dL (ref 3.5–5.0)
Alkaline Phosphatase: 75 U/L (ref 38–126)
Anion gap: 9 (ref 5–15)
BUN: 9 mg/dL (ref 8–23)
CO2: 25 mmol/L (ref 22–32)
Calcium: 9 mg/dL (ref 8.9–10.3)
Chloride: 105 mmol/L (ref 98–111)
Creatinine, Ser: 0.82 mg/dL (ref 0.61–1.24)
GFR, Estimated: >60 mL/min (ref >60)
Glucose, Bld: 182 mg/dL — ABNORMAL HIGH (ref 70–99)
Potassium: 3.4 mmol/L — ABNORMAL LOW (ref 3.5–5.1)
Sodium: 139 mmol/L (ref 135–145)
Total Bilirubin: 1.8 mg/dL — ABNORMAL HIGH (ref 0.3–1.2)
Total Protein: 7.6 g/dL (ref 6.5–8.1)

## 2021-12-23 LAB — D-DIMER, QUANTITATIVE: D-Dimer, Quant: 0.67 ug/mL-FEU — ABNORMAL HIGH (ref 0.00–0.50)

## 2021-12-23 LAB — GLUCOSE, CAPILLARY: Glucose-Capillary: 277 mg/dL — ABNORMAL HIGH (ref 70–99)

## 2021-12-23 LAB — BRAIN NATRIURETIC PEPTIDE: B Natriuretic Peptide: 585.1 pg/mL — ABNORMAL HIGH (ref 0.0–100.0)

## 2021-12-23 MED ORDER — CLOPIDOGREL BISULFATE 75 MG PO TABS
75.0000 mg | ORAL_TABLET | Freq: Every day | ORAL | Status: DC
  Administered 2021-12-23 – 2021-12-31 (×9): 75 mg via ORAL
  Filled 2021-12-23 (×9): qty 1

## 2021-12-23 MED ORDER — SPIRONOLACTONE 25 MG PO TABS
25.0000 mg | ORAL_TABLET | Freq: Every day | ORAL | Status: DC
  Administered 2021-12-23 – 2021-12-26 (×4): 25 mg via ORAL
  Filled 2021-12-23 (×4): qty 1

## 2021-12-23 MED ORDER — ACETAMINOPHEN 325 MG PO TABS
650.0000 mg | ORAL_TABLET | Freq: Four times a day (QID) | ORAL | Status: DC | PRN
  Administered 2021-12-26: 650 mg via ORAL
  Filled 2021-12-23: qty 2

## 2021-12-23 MED ORDER — INSULIN ASPART 100 UNIT/ML IJ SOLN
0.0000 [IU] | Freq: Three times a day (TID) | INTRAMUSCULAR | Status: DC
  Administered 2021-12-24: 5 [IU] via SUBCUTANEOUS
  Administered 2021-12-24: 15 [IU] via SUBCUTANEOUS
  Administered 2021-12-24: 5 [IU] via SUBCUTANEOUS
  Administered 2021-12-25: 8 [IU] via SUBCUTANEOUS
  Administered 2021-12-25: 3 [IU] via SUBCUTANEOUS
  Administered 2021-12-26: 8 [IU] via SUBCUTANEOUS
  Administered 2021-12-26: 3 [IU] via SUBCUTANEOUS
  Administered 2021-12-26: 5 [IU] via SUBCUTANEOUS
  Administered 2021-12-27 (×3): 8 [IU] via SUBCUTANEOUS
  Administered 2021-12-28: 15 [IU] via SUBCUTANEOUS
  Administered 2021-12-28: 3 [IU] via SUBCUTANEOUS
  Administered 2021-12-28: 8 [IU] via SUBCUTANEOUS
  Administered 2021-12-29: 5 [IU] via SUBCUTANEOUS
  Administered 2021-12-29: 11 [IU] via SUBCUTANEOUS
  Administered 2021-12-29: 3 [IU] via SUBCUTANEOUS
  Administered 2021-12-30: 8 [IU] via SUBCUTANEOUS
  Administered 2021-12-31: 2 [IU] via SUBCUTANEOUS
  Administered 2021-12-31 (×2): 3 [IU] via SUBCUTANEOUS
  Filled 2021-12-23 (×22): qty 1

## 2021-12-23 MED ORDER — ACETAMINOPHEN 650 MG RE SUPP: 650.0000 mg | Freq: Four times a day (QID) | RECTAL | Status: DC | PRN

## 2021-12-23 MED ORDER — KETOROLAC TROMETHAMINE 30 MG/ML IJ SOLN
30.0000 mg | Freq: Once | INTRAMUSCULAR | Status: AC
  Administered 2021-12-24: 30 mg via INTRAVENOUS
  Filled 2021-12-23: qty 1

## 2021-12-23 MED ORDER — POLYETHYLENE GLYCOL 3350 17 G PO PACK: 17.0000 g | PACK | Freq: Every day | ORAL | Status: DC | PRN

## 2021-12-23 MED ORDER — ISOSORBIDE MONONITRATE ER 30 MG PO TB24
15.0000 mg | ORAL_TABLET | Freq: Every day | ORAL | Status: DC
  Administered 2021-12-23 – 2021-12-31 (×9): 15 mg via ORAL
  Filled 2021-12-23 (×10): qty 1

## 2021-12-23 MED ORDER — CARVEDILOL 3.125 MG PO TABS
3.1250 mg | ORAL_TABLET | Freq: Two times a day (BID) | ORAL | Status: DC
  Administered 2021-12-23 – 2021-12-26 (×6): 3.125 mg via ORAL
  Filled 2021-12-23 (×6): qty 1

## 2021-12-23 MED ORDER — FUROSEMIDE 10 MG/ML IJ SOLN
40.0000 mg | Freq: Once | INTRAMUSCULAR | Status: AC
Start: 2021-12-23 — End: 2021-12-23
  Administered 2021-12-23: 40 mg via INTRAVENOUS
  Filled 2021-12-23: qty 4

## 2021-12-23 MED ORDER — SACUBITRIL-VALSARTAN 97-103 MG PO TABS
1.0000 | ORAL_TABLET | Freq: Two times a day (BID) | ORAL | Status: DC
  Administered 2021-12-23 – 2021-12-26 (×6): 1 via ORAL
  Filled 2021-12-23 (×6): qty 1

## 2021-12-23 MED ORDER — SACUBITRIL-VALSARTAN 49-51 MG PO TABS
1.0000 | ORAL_TABLET | Freq: Two times a day (BID) | ORAL | Status: DC
Start: 2021-12-23 — End: 2021-12-23

## 2021-12-23 MED ORDER — POTASSIUM CHLORIDE CRYS ER 20 MEQ PO TBCR
40.0000 meq | EXTENDED_RELEASE_TABLET | Freq: Once | ORAL | Status: AC
Start: 2021-12-23 — End: 2021-12-23
  Administered 2021-12-23: 40 meq via ORAL
  Filled 2021-12-23: qty 2

## 2021-12-23 MED ORDER — INSULIN ASPART 100 UNIT/ML IJ SOLN
0.0000 [IU] | Freq: Every day | INTRAMUSCULAR | Status: DC
  Administered 2021-12-23: 3 [IU] via SUBCUTANEOUS
  Administered 2021-12-24 – 2021-12-26 (×2): 2 [IU] via SUBCUTANEOUS
  Administered 2021-12-28 – 2021-12-29 (×2): 3 [IU] via SUBCUTANEOUS
  Administered 2021-12-30: 4 [IU] via SUBCUTANEOUS
  Filled 2021-12-23 (×6): qty 1

## 2021-12-23 MED ORDER — ENOXAPARIN SODIUM 60 MG/0.6ML IJ SOSY
0.5000 mg/kg | PREFILLED_SYRINGE | INTRAMUSCULAR | Status: DC
  Administered 2021-12-23 – 2021-12-29 (×7): 50 mg via SUBCUTANEOUS
  Filled 2021-12-23 (×7): qty 0.6

## 2021-12-23 MED ORDER — PROCHLORPERAZINE EDISYLATE 10 MG/2ML IJ SOLN
10.0000 mg | Freq: Once | INTRAMUSCULAR | Status: DC
Start: 2021-12-23 — End: 2021-12-27

## 2021-12-23 MED ORDER — INSULIN GLARGINE-YFGN 100 UNIT/ML ~~LOC~~ SOLN
5.0000 [IU] | Freq: Two times a day (BID) | SUBCUTANEOUS | Status: DC
Start: 2021-12-23 — End: 2021-12-27
  Administered 2021-12-23 – 2021-12-27 (×8): 5 [IU] via SUBCUTANEOUS
  Filled 2021-12-23 (×9): qty 0.05

## 2021-12-23 MED ORDER — NITROGLYCERIN 0.4 MG SL SUBL: 0.4000 mg | SUBLINGUAL_TABLET | SUBLINGUAL | Status: DC | PRN

## 2021-12-23 MED ORDER — ACETAMINOPHEN 325 MG PO TABS
650.0000 mg | ORAL_TABLET | Freq: Once | ORAL | Status: AC
  Administered 2021-12-23: 650 mg via ORAL
  Filled 2021-12-23: qty 2

## 2021-12-23 MED ORDER — ONDANSETRON HCL 4 MG/2ML IJ SOLN: 4.0000 mg | Freq: Four times a day (QID) | INTRAMUSCULAR | Status: DC | PRN

## 2021-12-23 MED ORDER — ONDANSETRON HCL 4 MG PO TABS
4.0000 mg | ORAL_TABLET | Freq: Four times a day (QID) | ORAL | Status: DC | PRN
  Administered 2021-12-25: 4 mg via ORAL
  Filled 2021-12-23: qty 1

## 2021-12-23 MED ORDER — PERFLUTREN LIPID MICROSPHERE
1.0000 mL | INTRAVENOUS | Status: AC | PRN
  Administered 2021-12-23: 2 mL via INTRAVENOUS

## 2021-12-23 NOTE — Assessment & Plan Note (Addendum)
Continue aspirin and Plavix and Coreg.

## 2021-12-23 NOTE — Assessment & Plan Note (Addendum)
Patient did not know that he is a diabetic.  Med patient reconciliation shows he was on Jardiance and metformin.  Hemoglobin A1c 7.5.

## 2021-12-23 NOTE — Assessment & Plan Note (Addendum)
Myalgias with cholesterol medications.  We will give Zetia to try to get LDL less than 70

## 2021-12-23 NOTE — Assessment & Plan Note (Deleted)
Most recent echocardiogram done in January 2022 with EF of 30 to 35%, grade 2 diastolic dysfunction and dilated cardiomyopathy. Clinically appears euvolemic. Takes Lasix only as needed. Appears to be at his baseline weight, last weight recorded was 99 kg during most recent heart failure clinic visit. Received 1 dose of IV Lasix. -Daily BMP and weight -Continue with p.o. Lasix -Continue with carvedilol -Jardiance was prescribed but patient was not taking it stating that he does not feel good after taking it. -Continue with Sherryll Burger

## 2021-12-23 NOTE — ED Notes (Signed)
Pt became dizzy and short of breath while walking. Pt O2 90%

## 2021-12-23 NOTE — Discharge Instructions (Addendum)
Please follow-up with Dr. Star Sink in the next few days to check on your congestive heart failure and the chest pain.  The troponins or heart blood test here look good.  I do want you to call his office in the morning though and arrange a follow-up.  Please return if you are worse as well or have more symptoms.  Because of the bright flashing lights you had in your eye I do want you to follow-up with eye doctor.  I discussed your case with Dr. Inez Pilgrim who is on-call for [facility].  He is very good.  He will follow you up in the office if you give the office a call and set up an appointment.  Again please return if you have any worsening eye symptoms as well.  These continue to take all your medications.

## 2021-12-23 NOTE — Consult Note (Addendum)
Cardiology Consultation:   Patient ID: [name]. MRN: [id number]; DOB: 1955-07-31  Admit date: 12/23/2021 Date of Consult: 12/23/2021  PCP:  Oneita Hurt No   [facility] Providers Cardiologist:  Lorine Bears, MD        Patient Profile:   Glen Koepp. is a 66 y.o. male with a hx of CAD/PCI who is being seen 12/23/2021 for the evaluation of chest pain, shortness of breath at the request of Dr. Nelson Chimes.  History of Present Illness:   [name] CAD/PCI LAD, diagonal, RCA, CTO RCA, ischemic cardiomyopathy EF 30 to 35%, former smoker x20+ years presenting with dizziness, headache, worsening shortness of breath and chest tightness.  He had on and off headache over the past several weeks, was worse last night waking up from sleep, had some bright flashing lights in his vision.  And it was associated with some chest tightness, dizziness and shortness of breath.  Symptoms are not associated with exertion.  Symptoms persisted through today prompting patient to come to the hospital.  He lives by himself, not sure if he snores.  He states his blood pressure has been elevated of late with systolics in the 170s to 180s.  Endorses taking his medication as prescribed.  Follows up in the heart failure clinic.  In the ED, chest x-ray was clear, EKG showed normal sinus rhythm, initial troponins were 38, 21.  BNP 585.  Blood pressure 181/85.  Patient tried ambulating, oxygen sats reduced to 89.  He was started on oxygen.  Given a dose of Lasix in the ED. head CT with no acute intracranial abnormalities   Past Medical History:  Diagnosis Date   Anginal pain (HCC)    Arthritis    CAD (coronary artery disease)    a. s/p MI with LAD and Diag stenting @ [name];  b. 06/2015 Cath: LAD 30m/d ISR, 100 RCA (ISR) w/ L->R collats, otw mod nonobs dzs-->Med Rx; c. 08/2019 Cath: LM nl, LAD 10p/m ISR, D1 20, D2 100, RI min irregs, LCX 42m/d, OM1 100, OM2 50, RCA 100p, 70d. RPDA fills via collats from LAD. EF 25-35%-->Med  Rx; d. 10/2019 NSTEMI/Cath: LCX now 100 (2.75x15 Resolute Onyx DES), otw stable compared to 08/2019.   Chronic combined systolic and diastolic CHF (congestive heart failure) (HCC)    a. 06/2015 Echo: EF 20-25%, Gr3 DD; b. 10/2019 Echo: EF 25-30%, glob HK, sev inf/infapical HK. Mod dil LA.   Dyspnea    Essential hypertension    Headache 12/23/2021   Hyperlipidemia    Hypokalemia    a. 06/2015 in setting of diuresis.   Ischemic cardiomyopathy    a. 2011 EF 45% ([name]);  b. 06/2015 Echo: EF 20-25%; c. 04/2019 Echo: EF 40-45%; d. 08/2019 LV gram: EF 25-35%; e. 10/2019 Echo: EF 25-30%.   PVC's (premature ventricular contractions)    a. 09/2019 Zio (3 days): Avg hr 70, 4 runs NSVT, 5 runs SVT, rare PACs, frequent PVCs w/ 11.8% burden.   Sleep apnea    Stroke/Right temporal lobe infarction [facility])    a. 10/2019 MRI brain: 1cm acute ischemic nonhemorrhagic R temporal lobe infarct. Age-related cerebral atrophy w/ moderate chronic small vessel ischemic dzs.   Type 2 diabetes mellitus with hyperglycemia (HCC) 10/08/2019    Past Surgical History:  Procedure Laterality Date   BACK SURGERY  04/2019   CARDIAC CATHETERIZATION N/A 06/10/2015   Procedure: Left Heart Cath;  Surgeon: Iran Ouch, MD;  Location: [facility] INVASIVE CV LAB;  Service: Cardiovascular;  Laterality: N/A;   CORONARY STENT INTERVENTION N/A 10/08/2019   Procedure: CORONARY STENT INTERVENTION;  Surgeon: Iran Ouch, MD;  Location: [facility] INVASIVE CV LAB;  Service: Cardiovascular;  Laterality: N/A;   CORONARY STENT PLACEMENT     LEFT HEART CATH AND CORONARY ANGIOGRAPHY N/A 10/08/2019   Procedure: LEFT HEART CATH AND CORONARY ANGIOGRAPHY poss pci;  Surgeon: Iran Ouch, MD;  Location: [facility] INVASIVE CV LAB;  Service: Cardiovascular;  Laterality: N/A;   RIGHT/LEFT HEART CATH AND CORONARY ANGIOGRAPHY N/A 08/20/2019   Procedure: RIGHT/LEFT HEART CATH AND CORONARY ANGIOGRAPHY;  Surgeon: Iran Ouch, MD;  Location: [facility] INVASIVE CV LAB;   Service: Cardiovascular;  Laterality: N/A;     Home Medications:  Prior to Admission medications   Medication Sig Start Date End Date Taking? Authorizing Provider  acetaminophen (TYLENOL) 500 MG tablet Take 1,500 mg by mouth 2 (two) times daily. At bedtime and as needed. Patient not taking: Reported on 12/23/2021    [provider]  carvedilol (COREG) 3.125 MG tablet Take 1 tablet (3.125 mg total) by mouth 2 (two) times daily with a meal. Patient not taking: Reported on 12/23/2021 06/19/21   Dorcas Carrow, MD  clopidogrel (PLAVIX) 75 MG tablet Take 1 tablet (75 mg total) by mouth once daily. Patient not taking: Reported on 12/23/2021 06/19/21   Dorcas Carrow, MD  colchicine 0.6 MG tablet Take 0.6 mg by mouth daily as needed. Patient not taking: Reported on 12/23/2021    [provider]  empagliflozin (JARDIANCE) 10 MG TABS tablet Take by mouth daily. Patient takes once every 3 days Patient not taking: Reported on 12/23/2021    [provider]  furosemide (LASIX) 40 MG tablet Take 1 tablet (40 mg total) by mouth once daily as needed. Patient not taking: Reported on 12/23/2021 06/19/21   Dorcas Carrow, MD  isosorbide mononitrate (IMDUR) 30 MG 24 hr tablet Take (1/2) tablet (15 mg total) by mouth once daily. Patient not taking: Reported on 12/23/2021 06/19/21   Dorcas Carrow, MD  metFORMIN (GLUCOPHAGE) 500 MG tablet Take 1 tablet (500 mg total) by mouth 2 (two) times daily with a meal. Patient not taking: Reported on 12/23/2021 06/20/21 06/20/22  Dorcas Carrow, MD  nitroGLYCERIN (NITROSTAT) 0.4 MG SL tablet Place 1 tablet (0.4 mg total) under the tongue every 5 (five) minutes as needed for chest pain. Patient not taking: Reported on 12/23/2021 10/20/21 01/18/22  Delma Freeze, FNP  sacubitril-valsartan (ENTRESTO) 49-51 MG Take 1 tablet by mouth 2 (two) times daily. Patient not taking: Reported on 12/23/2021 06/19/21   Dorcas Carrow, MD    Inpatient  Medications: Scheduled Meds:  carvedilol  3.125 mg Oral BID WC   clopidogrel  75 mg Oral Daily   enoxaparin (LOVENOX) injection  0.5 mg/kg Subcutaneous Q24H   insulin aspart  0-15 Units Subcutaneous TID WC   insulin aspart  0-5 Units Subcutaneous QHS   insulin glargine-yfgn  5 Units Subcutaneous BID   isosorbide mononitrate  15 mg Oral Daily   ketorolac  30 mg Intravenous Once   potassium chloride  40 mEq Oral Once   prochlorperazine  10 mg Intravenous Once   sacubitril-valsartan  1 tablet Oral BID   Continuous Infusions:  PRN Meds: acetaminophen **OR** acetaminophen, nitroGLYCERIN, ondansetron **OR** ondansetron (ZOFRAN) IV, polyethylene glycol  Allergies:    Allergies  Allergen Reactions   Contrast Media [Iodinated Contrast Media] Shortness Of Breath   Iohexol Shortness Of Breath     Onset Date: [id number]  Jardiance [Empagliflozin] Other (See Comments)    Fatigue/weakness   Atorvastatin Other (See Comments)    Myalgias    Hydrocodone Itching   Morphine And Related Other (See Comments)    Lost control    Penicillins Other (See Comments)    Unknown reaction Did it involve swelling of the face/tongue/throat, SOB, or low BP? Unknown Did it involve sudden or severe rash/hives, skin peeling, or any reaction on the inside of your mouth or nose? Unknown Did you need to seek medical attention at a hospital or doctor's office? Yes When did it last happen?      Childhood allergy  If all above answers are "NO", may proceed with cephalosporin use.    Rosuvastatin Other (See Comments)    Myalgias     Social History:   Social History   Socioeconomic History   Marital status: Widowed    Spouse name: Not on file   Number of children: 2   Years of education: Not on file   Highest education level: GED or equivalent  Occupational History   Occupation: Brick layer   Tobacco Use   Smoking status: Former    Types: Cigarettes    Quit date: 06/10/2011    Years since quitting:  10.5   Smokeless tobacco: Never  Substance and Sexual Activity   Alcohol use: No    Alcohol/week: 0.0 standard drinks of alcohol   Drug use: No   Sexual activity: Not Currently  Other Topics Concern   Not on file  Social History Narrative   Not on file   Social Determinants of Health   Financial Resource Strain: Low Risk  (04/30/2019)   Overall Financial Resource Strain (CARDIA)    Difficulty of Paying Living Expenses: Not hard at all  Food Insecurity: No Food Insecurity (04/30/2019)   Hunger Vital Sign    Worried About Running Out of Food in the Last Year: Never true    [place] in the Last Year: Never true  Transportation Needs: No Transportation Needs (04/30/2019)   PRAPARE - Hydrologist (Medical): No    Lack of Transportation (Non-Medical): No  Physical Activity: Inactive (04/30/2019)   Exercise Vital Sign    Days of Exercise per Week: 0 days    Minutes of Exercise per Session: 0 min  Stress: No Stress Concern Present (04/30/2019)   [place]    Feeling of Stress : Not at all  Social Connections: Moderately Isolated (04/30/2019)   Social Connection and Isolation Panel [NHANES]    Frequency of Communication with Friends and Family: More than three times a week    Frequency of Social Gatherings with Friends and Family: More than three times a week    Attends Religious Services: Never    Marine scientist or Organizations: Yes    Attends Archivist Meetings: Never    Marital Status: Divorced  Human resources officer Violence: Not At Risk (04/30/2019)   Humiliation, Afraid, Rape, and Kick questionnaire    Fear of Current or Ex-Partner: No    Emotionally Abused: No    Physically Abused: No    Sexually Abused: No    Family History:    Family History  Problem Relation Age of Onset   Coronary artery disease Mother    Diabetes Mother    Coronary artery disease  Father      ROS:  Please see the history of present illness.  All other ROS reviewed and negative.     Physical Exam/Data:   Vitals:   12/23/21 1600 12/23/21 1630 12/23/21 1700 12/23/21 1730  BP: (!) 158/96 (!) 164/104 (!) 163/105 (!) 164/98  Pulse: 64 68    Resp: (!) 28 (!) 21 (!) 31 20  Temp:      TempSrc:      SpO2: 94% 99%    Weight:      Height:       No intake or output data in the 24 hours ending 12/23/21 1742    12/23/2021    9:54 AM 10/20/2021   10:50 AM 09/17/2021    1:21 PM  Last 3 Weights  Weight (lbs) 217 lb 218 lb 4 oz 217 lb 7 oz  Weight (kg) 98.431 kg 98.998 kg 98.629 kg     Body mass index is 35.02 kg/m.  General:  Well nourished, well developed, in no acute distress HEENT: normal Neck: no JVD Vascular: No carotid bruits; Distal pulses 2+ bilaterally Cardiac:  normal S1, S2; RRR; no murmur Lungs: Decreased breath sounds bilaterally Abd: soft, nontender, distended Ext: no edema Musculoskeletal:  No deformities, BUE and BLE strength normal and equal Skin: warm and dry  Neuro:  CNs 2-12 intact, no focal abnormalities noted Psych:  Normal affect   EKG:  The EKG was personally reviewed and demonstrates: Sinus rhythm Telemetry:  Telemetry was personally reviewed and demonstrates: Sinus rhythm  Relevant CV Studies: TTE 06/2021 1. Left ventricular ejection fraction, by estimation, is 30 to 35%. The  left ventricle has moderately decreased function. The left ventricle  demonstrates global hypokinesis. Images concerning for severe hypokinesis  of the inferior/inferoseptal wall  (image 16-19) . There is moderate left ventricular hypertrophy. Left  ventricular diastolic parameters are consistent with Grade II diastolic  dysfunction (pseudonormalization).   2. Right ventricular systolic function is mildly reduced. The right  ventricular size is normal. RVSP is 19 mm Hg + RA pressure.   3. Left atrial size was moderately dilated.   4. The mitral valve is  normal in structure. Mild to moderate mitral valve  regurgitation. No evidence of mitral stenosis.   5. The aortic valve is normal in structure. Aortic valve regurgitation is  not visualized. Aortic valve sclerosis/calcification is present, without  any evidence of aortic stenosis.   Laboratory Data:  High Sensitivity Troponin:   Recent Labs  Lab 12/23/21 0957 12/23/21 1235 12/23/21 1600  TROPONINIHS 30* 21* 22*     Chemistry Recent Labs  Lab 12/23/21 0957  NA 139  K 3.4*  CL 105  CO2 25  GLUCOSE 182*  BUN 9  CREATININE 0.82  CALCIUM 9.0  GFRNONAA >60  ANIONGAP 9    Recent Labs  Lab 12/23/21 0957  PROT 7.6  ALBUMIN 4.0  AST 15  ALT 14  ALKPHOS 75  BILITOT 1.8*   Lipids No results for input(s): "CHOL", "TRIG", "HDL", "LABVLDL", "LDLCALC", "CHOLHDL" in the last 168 hours.  Hematology Recent Labs  Lab 12/23/21 0957  WBC 13.4*  RBC 5.52  HGB 16.2  HCT 47.7  MCV 86.4  MCH 29.3  MCHC 34.0  RDW 14.5  PLT 263   Thyroid No results for input(s): "TSH", "FREET4" in the last 168 hours.  BNP Recent Labs  Lab 12/23/21 0957  BNP 585.1*    DDimer  Recent Labs  Lab 12/23/21 1600  DDIMER 0.67*     Radiology/Studies:  CT Head Wo Contrast  Result Date: 12/23/2021 CLINICAL DATA:  Headache EXAM: CT HEAD WITHOUT CONTRAST TECHNIQUE: Contiguous axial images were obtained from the base of the skull through the vertex without intravenous contrast. RADIATION DOSE REDUCTION: This exam was performed according to the departmental dose-optimization program which includes automated exposure control, adjustment of the mA and/or kV according to patient size and/or use of iterative reconstruction technique. COMPARISON:  CT head 06/19/2021 FINDINGS: Brain: No acute intracranial hemorrhage, mass effect, or herniation. No extra-axial fluid collections. No evidence of acute territorial infarct. No hydrocephalus. Moderate cortical volume loss. Extensive patchy hypodensities  throughout the periventricular and subcortical white matter, likely secondary to chronic microvascular ischemic changes. Small old infarct in the left posterior periventricular white matter. Vascular: No hyperdense vessel or unexpected calcification. Skull: Normal. Negative for fracture or focal lesion. Sinuses/Orbits: No acute finding. Chronic moderate mucosal opacification of the left sphenoid sinus. Other: None. IMPRESSION: Chronic changes as described with no acute intracranial process identified. Electronically Signed   By: Ofilia Neas [name]   On: 12/23/2021 11:44   DG Chest 2 View  Result Date: 12/23/2021 CLINICAL DATA:  Chest pain, dizziness, and shortness of breath. EXAM: CHEST - 2 VIEW COMPARISON:  Chest radiograph 06/17/2021 FINDINGS: The cardiomediastinal silhouette is unchanged with upper limits of normal heart size and mild chronic prominence of the central pulmonary arteries. Mild scarring is noted in the right middle lobe. No acute airspace consolidation, edema, pleural effusion, or pneumothorax is identified. No acute osseous abnormality is seen. IMPRESSION: No active cardiopulmonary disease. Electronically Signed   By: Logan Bores [name]   On: 12/23/2021 10:24     Assessment and Plan:   Chest pain, history of CAD/PCI -Not consistent with angina -Troponins 30, 21 -Obtain Myoview in the a.m. to evaluate high risk ischemia -Continue Imdur, Coreg, aspirin, Plavix  2.  Ischemic cardiomyopathy EF 30 to 35% -Appears euvolemic.  Patient was given Lasix x1 in the ED. -BP elevated -Increase PTA Entresto to 97-103 -Start Aldactone 25 mg daily -Continue Coreg 3.125 mg twice daily.  Heart rate in the 60s  3.  Hypertension -BP elevated -Start Aldactone, increase Entresto as above  4.  Shortness of breath, desaturation with ambulation -Decreased breath sounds bilaterally, chest x-ray appears clear -Recommend pulmonary work-up for possible emphysema, CO2 retention, consider chest CT  due to previous smoking history as per primary team -Consider sleep study as outpatient  Total encounter time more than 85 minutes  Greater than 50% was spent in counseling and coordination of care with the patient  Signed, Kate Sable, MD  12/23/2021 5:42 PM

## 2021-12-23 NOTE — ED Provider Notes (Addendum)
[facility] Provider Note    Event Date/Time   First MD Initiated Contact with Patient 12/23/21 1038     (approximate)   History   Chest Pain   HPI  [name]. is a 66 y.o. male who came in with several complaints.  He said he developed a headache in the middle of last night.  The headache came on gradually but became quite intense.  He also had a bright light flashing in his eye and after that his vision was kind of blurry intermittently.  When I saw him his vision was clearing.  It has now cleared completely and is 2020 in both eyes with his glasses and almost 2020 without his glasses.  He had some chest tightness as well and felt dizzy or lightheaded and had some shortness of breath.  He reports he had similar symptoms with his heart attack.      Physical Exam   Triage Vital Signs: ED Triage Vitals  Enc Vitals Group     BP 12/23/21 1012 (!) 171/108     Pulse Rate 12/23/21 1012 66     Resp 12/23/21 1012 18     Temp 12/23/21 1012 98.6 F (37 C)     Temp Source 12/23/21 1012 Oral     SpO2 12/23/21 1012 92 %     Weight 12/23/21 0954 217 lb (98.4 kg)     Height 12/23/21 0954 5\' 6"  (1.676 m)     Head Circumference --      Peak Flow --      Pain Score 12/23/21 0954 9     Pain Loc --      Pain Edu? --      Excl. in GC? --     Most recent vital signs: Vitals:   12/23/21 1330 12/23/21 1400  BP: (!) 148/86 (!) 181/85  Pulse: 62 74  Resp: (!) 21 (!) 21  Temp:    SpO2: 92% 94%    General: Awake, no distress.  CV:  Good peripheral perfusion.  Heart regular rate and rhythm no audible murmurs Resp:  Normal effort.  Lungs are clear Abd:  No distention.  Soft and nontender Extremities slight trace edema.   ED Results / Procedures / Treatments   Labs (all labs ordered are listed, but only abnormal results are displayed) Labs Reviewed  CBC - Abnormal; Notable for the following components:      Result Value   WBC 13.4 (*)    All other  components within normal limits  COMPREHENSIVE METABOLIC PANEL - Abnormal; Notable for the following components:   Potassium 3.4 (*)    Glucose, Bld 182 (*)    Total Bilirubin 1.8 (*)    All other components within normal limits  BRAIN NATRIURETIC PEPTIDE - Abnormal; Notable for the following components:   B Natriuretic Peptide 585.1 (*)    All other components within normal limits  TROPONIN I (HIGH SENSITIVITY) - Abnormal; Notable for the following components:   Troponin I (High Sensitivity) 30 (*)    All other components within normal limits  TROPONIN I (HIGH SENSITIVITY) - Abnormal; Notable for the following components:   Troponin I (High Sensitivity) 21 (*)    All other components within normal limits  TROPONIN I (HIGH SENSITIVITY)     EKG  EKG read and interpreted by me shows normal sinus rhythm rate of 74 rightward axis no acute ST-T wave changes   RADIOLOGY Chest x-ray read by  radiology as no acute disease I think there may be some increased vascular markings consistent with CHF.  This was reinforced by the BNP of over 500.   PROCEDURES:  Critical Care performed:   Procedures   MEDICATIONS ORDERED IN ED: Medications  furosemide (LASIX) injection 40 mg (40 mg Intravenous Given 12/23/21 1119)     IMPRESSION / MDM / ASSESSMENT AND PLAN / ED COURSE  I reviewed the triage vital signs and the nursing notes. ----------------------------------------- 2:18 PM on 12/23/2021 ----------------------------------------- Patient's troponins are going down and EKG looked okay.  Head CT is good his symptoms are gone including the headache and blurry vision.  His visual acuity is 20/20 bilaterally with his glasses on.  I discussed him in detail with the eye doctor because of the bright flashing light that he had.  Dr. Estelle Grumbles will follow him up in the office in the next couple days patient will return if he is worse.    Patient did get dizzy and had to stop walking when we tried  to see if he would desaturate.  His O2 sats went down to 89 when he stopped.  I do not like this.  We will have to keep him.  He may actually be having angina.    Patient's presentation is most consistent with acute complicated illness / injury requiring diagnostic workup.  Actually apparent exacerbation of chronic chronic illness which is complicated and required diagnostic evaluation. The patient is on the cardiac monitor to evaluate for evidence of arrhythmia and/or significant heart rate changes.  None were seen.      FINAL CLINICAL IMPRESSION(S) / ED DIAGNOSES   Final diagnoses:  Nonspecific chest pain  Nonintractable headache, unspecified chronicity pattern, unspecified headache type  Visual changes  Congestive heart failure, unspecified HF chronicity, unspecified heart failure type (HCC)     Rx / DC Orders   ED Discharge Orders          Ordered    Ambulatory referral to Cardiology       Comments: If you have not heard from the Cardiology office within the next 72 hours please call [phone].   12/23/21 1423             Note:  This document was prepared using Dragon voice recognition software and may include unintentional dictation errors.   Arnaldo Natal, MD 12/23/21 1426    Arnaldo Natal, MD 12/23/21 1543    Arnaldo Natal, MD 12/23/21 1551 EKG done at the patient got dizzy shows normal sinus rhythm rate of 65 rightward axis very similar to EKG #1.  Because he complains of a lot of shortness of breath I have educationally added a D-dimer as well.  This will come back shortly.   Arnaldo Natal, MD 12/23/21 1556

## 2021-12-23 NOTE — ED Notes (Signed)
Dietary brought pt a meal tray pt refused.

## 2021-12-23 NOTE — Assessment & Plan Note (Addendum)
Continue Entresto, Aldactone, low-dose Coreg, Imdur, IV Lasix.

## 2021-12-23 NOTE — Assessment & Plan Note (Addendum)
Status post cardiac catheterization with ongoing CAD.  Appears to be stable with no changes from previous cath.  Chest pain has resolved.  May have been bronchospasm

## 2021-12-23 NOTE — ED Triage Notes (Signed)
Pt in with co chest pain that started yesterday, states also has dizziness and shob. Pt has hx of MI with same symptoms.

## 2021-12-23 NOTE — Assessment & Plan Note (Addendum)
IV magnesium ordered earlier.  Will give as needed Fioricet.

## 2021-12-23 NOTE — ED Notes (Signed)
Informed RN bed assigned 

## 2021-12-23 NOTE — H&P (Signed)
History and Physical    Patient: [name]. LPF:[id number] DOB: Nov 30, 1955 DOA: 12/23/2021 DOS: the patient was seen and examined on 12/23/2021 PCP: Pcp, No  Patient coming from: Home  Chief Complaint:  Chief Complaint  Patient presents with   Chest Pain   HPI: [name]. is a 66 y.o. male with medical history significant of coronary artery disease, type 2 diabetes mellitus, hypertension, HFrEF(EF of 30 to 35%) dilated cardiomyopathy and dyslipidemia came to ED with concern of chest pain which woke him up from sleep.  It felt more like pressure, radiating to the left arm and associated with shortness of breath and nausea.  Per patient it feels similar to his prior heart attack.  Patient was also having headache and complaining about flashing lights and blurry vision.  Flashing light and blurry vision resolves.  Patient will follow-up with ophthalmology as an outpatient.  Per patient he had orthopnea and PND for a long time, recently worsening exertional dyspnea and orthopnea for the past couple of weeks.  Patient was not taking his medications very regularly and only takes it as as needed.  Patient denies any recent illnesses, upper respiratory symptoms, fever or chills. No recent change in his appetite or bowel habits. No recent lower extremity edema. No urinary symptoms.  Patient denies smoking, alcohol or substance use.  ED course and data reviewed.  On arrival patient had elevated blood pressure at 181/85, otherwise hemodynamically stable, he was becoming dizzy and mildly hypoxic with ambulation.  Chest pain resolved.  Blurry vision and flashing light resolved.  Continue to have some headache. Labs pertinent for mild leukocytosis at 13.4, potassium of 3.4, troponin 30>.21, BNP 585. Chest x-ray without any significant abnormality. CT head was negative for any acute abnormality.  EKG was negative for any acute ST changes.  ED provider also consulted ophthalmology who  advised outpatient follow-up.  TRH was consulted due to persistent dizziness and becoming little hypoxic with ambulation.  He was given 1 dose of IV Lasix 40 mg. Cardiology was also consulted-Dr.Agbor was notified   Review of Systems: As mentioned in the history of present illness. All other systems reviewed and are negative. Past Medical History:  Diagnosis Date   Anginal pain (HCC)    Arthritis    CAD (coronary artery disease)    a. s/p MI with LAD and Diag stenting @ [name];  b. 06/2015 Cath: LAD 56m/d ISR, 100 RCA (ISR) w/ L->R collats, otw mod nonobs dzs-->Med Rx; c. 08/2019 Cath: LM nl, LAD 10p/m ISR, D1 20, D2 100, RI min irregs, LCX 35m/d, OM1 100, OM2 50, RCA 100p, 70d. RPDA fills via collats from LAD. EF 25-35%-->Med Rx; d. 10/2019 NSTEMI/Cath: LCX now 100 (2.75x15 Resolute Onyx DES), otw stable compared to 08/2019.   Chronic combined systolic and diastolic CHF (congestive heart failure) (HCC)    a. 06/2015 Echo: EF 20-25%, Gr3 DD; b. 10/2019 Echo: EF 25-30%, glob HK, sev inf/infapical HK. Mod dil LA.   Dyspnea    Essential hypertension    Headache 12/23/2021   Hyperlipidemia    Hypokalemia    a. 06/2015 in setting of diuresis.   Ischemic cardiomyopathy    a. 2011 EF 45% ([name]);  b. 06/2015 Echo: EF 20-25%; c. 04/2019 Echo: EF 40-45%; d. 08/2019 LV gram: EF 25-35%; e. 10/2019 Echo: EF 25-30%.   PVC's (premature ventricular contractions)    a. 09/2019 Zio (3 days): Avg hr 70, 4 runs NSVT, 5 runs SVT, rare PACs,  frequent PVCs w/ 11.8% burden.   Sleep apnea    Stroke/Right temporal lobe infarction [facility])    a. 10/2019 MRI brain: 1cm acute ischemic nonhemorrhagic R temporal lobe infarct. Age-related cerebral atrophy w/ moderate chronic small vessel ischemic dzs.   Type 2 diabetes mellitus with hyperglycemia (HCC) 10/08/2019   Past Surgical History:  Procedure Laterality Date   BACK SURGERY  04/2019   CARDIAC CATHETERIZATION N/A 06/10/2015   Procedure: Left Heart Cath;  Surgeon: Iran Ouch, MD;  Location: [facility] INVASIVE CV LAB;  Service: Cardiovascular;  Laterality: N/A;   CORONARY STENT INTERVENTION N/A 10/08/2019   Procedure: CORONARY STENT INTERVENTION;  Surgeon: Iran Ouch, MD;  Location: [facility] INVASIVE CV LAB;  Service: Cardiovascular;  Laterality: N/A;   CORONARY STENT PLACEMENT     LEFT HEART CATH AND CORONARY ANGIOGRAPHY N/A 10/08/2019   Procedure: LEFT HEART CATH AND CORONARY ANGIOGRAPHY poss pci;  Surgeon: Iran Ouch, MD;  Location: [facility] INVASIVE CV LAB;  Service: Cardiovascular;  Laterality: N/A;   RIGHT/LEFT HEART CATH AND CORONARY ANGIOGRAPHY N/A 08/20/2019   Procedure: RIGHT/LEFT HEART CATH AND CORONARY ANGIOGRAPHY;  Surgeon: Iran Ouch, MD;  Location: [facility] INVASIVE CV LAB;  Service: Cardiovascular;  Laterality: N/A;   Social History:  reports that he quit smoking about 10 years ago. His smoking use included cigarettes. He has never used smokeless tobacco. He reports that he does not drink alcohol and does not use drugs.  Allergies  Allergen Reactions   Contrast Media [Iodinated Contrast Media] Shortness Of Breath   Iohexol Shortness Of Breath     Onset Date: [id number]    Jardiance [Empagliflozin] Other (See Comments)    Fatigue/weakness   Atorvastatin Other (See Comments)    Myalgias    Hydrocodone Itching   Morphine And Related Other (See Comments)    Lost control    Penicillins Other (See Comments)    Unknown reaction Did it involve swelling of the face/tongue/throat, SOB, or low BP? Unknown Did it involve sudden or severe rash/hives, skin peeling, or any reaction on the inside of your mouth or nose? Unknown Did you need to seek medical attention at a hospital or doctor's office? Yes When did it last happen?      Childhood allergy  If all above answers are "NO", may proceed with cephalosporin use.    Rosuvastatin Other (See Comments)    Myalgias     Family History  Problem Relation Age of Onset   Coronary artery disease Mother     Diabetes Mother    Coronary artery disease Father     Prior to Admission medications   Medication Sig Start Date End Date Taking? Authorizing Provider  acetaminophen (TYLENOL) 500 MG tablet Take 1,500 mg by mouth 2 (two) times daily. At bedtime and as needed.    [provider]  carvedilol (COREG) 3.125 MG tablet Take 1 tablet (3.125 mg total) by mouth 2 (two) times daily with a meal. 06/19/21   Dorcas Carrow, MD  clopidogrel (PLAVIX) 75 MG tablet Take 1 tablet (75 mg total) by mouth once daily. 06/19/21   Dorcas Carrow, MD  colchicine 0.6 MG tablet Take 0.6 mg by mouth daily as needed.    [provider]  empagliflozin (JARDIANCE) 10 MG TABS tablet Take by mouth daily. Patient takes once every 3 days    [provider]  furosemide (LASIX) 40 MG tablet Take 1 tablet (40 mg total) by mouth once daily as needed. 06/19/21  Dorcas Carrow, MD  isosorbide mononitrate (IMDUR) 30 MG 24 hr tablet Take (1/2) tablet (15 mg total) by mouth once daily. 06/19/21   Dorcas Carrow, MD  metFORMIN (GLUCOPHAGE) 500 MG tablet Take 1 tablet (500 mg total) by mouth 2 (two) times daily with a meal. 06/20/21 06/20/22  Dorcas Carrow, MD  nitroGLYCERIN (NITROSTAT) 0.4 MG SL tablet Place 1 tablet (0.4 mg total) under the tongue every 5 (five) minutes as needed for chest pain. 10/20/21 01/18/22  Delma Freeze, FNP  sacubitril-valsartan (ENTRESTO) 49-51 MG Take 1 tablet by mouth 2 (two) times daily. 06/19/21   Dorcas Carrow, MD    Physical Exam: Vitals:   12/23/21 1330 12/23/21 1400 12/23/21 1540 12/23/21 1550  BP: (!) 148/86 (!) 181/85    Pulse: 62 74    Resp: (!) 21 (!) 21    Temp:      TempSrc:      SpO2: 92% 94% (!) 89% 95%  Weight:      Height:       General: Vital signs reviewed.  Patient is well-developed and well-nourished, in no acute distress and cooperative with exam.  Head: Normocephalic and atraumatic. Eyes: EOMI, conjunctivae normal, no scleral icterus.  Neck:  Supple, trachea midline, normal ROM, JVD,  Cardiovascular: RRR, S1 normal, S2 normal, no murmurs,  Pulmonary/Chest: Clear to auscultation bilaterally, no wheezes, rales, or rhonchi. Abdominal: Soft, non-tender, non-distended, BS +,  Extremities: No lower extremity edema bilaterally,  pulses symmetric and intact bilaterally. No cyanosis or clubbing. Neurological: A&O x3, Strength is normal and symmetric bilaterally, cranial nerve II-XII are grossly intact, no focal motor deficit, sensory intact to light touch bilaterally. Psychiatric: Normal mood and affect.   Data Reviewed: Prior data reviewed as mentioned above.  Assessment and Plan: * Chest pain Chest pain has both typical and atypical features.  Patient unable to identify any exertional component, current chest pain woke him up while sleeping, feels like pressure, radiating to left arm, associated with some nausea and shortness of breath, no vomiting.  Chest pain has been resolved by the time of interview. Significant coronary artery disease with history of multiple stents placement. Patient mostly noncompliant with medications. Troponin barely positive with a downward trend. EKG without any significant abnormality -Admit to telemetry under observation -Cardiology consult -Echocardiogram -Nitroglycerin as needed -Continue home Imdur -Continue home Plavix -Not on any statin stating that he cannot tolerate them due to myalgia.  CAD (coronary artery disease) Patient has an extensive history of CAD, s/p multiple stent placement. Also has ischemic cardiac myopathy. Noncompliant with diet and medications. Most recent cardiac catheterization in April 2021 which shows significant underlying three-vessel disease.  In stent restenosis and a new drug-eluting stent was placed in left circumflex. -Restarting home Imdur -Patient need to be on a statin-stating that he was unable to tolerate due to myalgia. -Continue Plavix -Follow-up cardiology  recommendations   Headache Patient had 10 out of 10 headache with associated nausea and photo phobia.  No prior history of migraine headaches.  Blood pressure was markedly elevated. He was also experiencing some flashing lights and blurry vision which has been resolved. CT head was without any acute abnormality. ED provider talked with ophthalmology and they will see him as an outpatient. Tylenol did not help. -Ordered 1 dose of Toradol and Compazine  Essential hypertension Blood pressure elevated.  Has not taken any medications today.  Per patient he takes his medications whenever he remembers.  He goes without medications for days. -Restarting home  Imdur and Entresto. -Continue home Lasix -Continue carvedilol  Hyperlipidemia Crestor was listed in his chart but patient was not taking it stating that it causes myalgia. -Patient needs to be on cholesterol-lowering medication based on his extensive history of CAD. -Outpatient follow-up  Chronic systolic heart failure ([place]) Most recent echocardiogram done in January 2022 with EF of 30 to [id number], grade 2 diastolic dysfunction and dilated cardiomyopathy. Clinically appears euvolemic. Takes Lasix only as needed. Appears to be at his baseline weight, last weight recorded was 99 kg during most recent heart failure clinic visit. Received 1 dose of IV Lasix. -Daily BMP and weight -Continue with p.o. Lasix -Continue with carvedilol -Jardiance was prescribed but patient was not taking it stating that he does not feel good after taking it. -Continue with Entresto   Type 2 diabetes mellitus with hyperglycemia (HCC) CBG elevated.  Patient never checked his blood glucose level at home.  He was only taking metformin, not taking his prescribed Jardiance. -A1c -Semglee 5 units twice daily -SSI  Advance Care Planning:   Code Status: Full Code   Consults: Cardiology  Family Communication: Discussed with patient  Severity of Illness: The  appropriate patient status for this patient is OBSERVATION. Observation status is judged to be reasonable and necessary in order to provide the required intensity of service to ensure the patient's safety. The patient's presenting symptoms, physical exam findings, and initial radiographic and laboratory data in the context of their medical condition is felt to place them at decreased risk for further clinical deterioration. Furthermore, it is anticipated that the patient will be medically stable for discharge from the hospital within 2 midnights of admission.   This record has been created using Systems analyst. Errors have been sought and corrected,but may not always be located. Such creation errors do not reflect on the standard of care.   Author: Lorella Nimrod, MD 12/23/2021 5:03 PM  For on call review [email or web address].

## 2021-12-24 ENCOUNTER — Observation Stay (HOSPITAL_COMMUNITY): Payer: Self-pay

## 2021-12-24 DIAGNOSIS — J9601 Acute respiratory failure with hypoxia: Secondary | ICD-10-CM | POA: Diagnosis present

## 2021-12-24 DIAGNOSIS — I5022 Chronic systolic (congestive) heart failure: Secondary | ICD-10-CM | POA: Diagnosis present

## 2021-12-24 DIAGNOSIS — I5023 Acute on chronic systolic (congestive) heart failure: Secondary | ICD-10-CM | POA: Diagnosis present

## 2021-12-24 DIAGNOSIS — R079 Chest pain, unspecified: Secondary | ICD-10-CM

## 2021-12-24 LAB — NM MYOCAR MULTI W/SPECT W/WALL MOTION / EF
LV dias vol: 206 mL (ref 62–150)
LV sys vol: 150 mL
Nuc Stress EF: 27 %
Peak HR: 81 {beats}/min
Rest HR: 53 {beats}/min
Rest Nuclear Isotope Dose: 10.5 mCi
SDS: 4
SRS: 17
SSS: 20
ST Depression (mm): 0 mm
Stress Nuclear Isotope Dose: 30.4 mCi
TID: 1.16

## 2021-12-24 LAB — BASIC METABOLIC PANEL WITH GFR
Anion gap: 10 (ref 5–15)
BUN: 23 mg/dL (ref 8–23)
CO2: 24 mmol/L (ref 22–32)
Calcium: 9.3 mg/dL (ref 8.9–10.3)
Chloride: 105 mmol/L (ref 98–111)
Creatinine, Ser: 0.97 mg/dL (ref 0.61–1.24)
GFR, Estimated: >60 mL/min
Glucose, Bld: 235 mg/dL — ABNORMAL HIGH (ref 70–99)
Potassium: 3.5 mmol/L (ref 3.5–5.1)
Sodium: 139 mmol/L (ref 135–145)

## 2021-12-24 LAB — GLUCOSE, CAPILLARY
Glucose-Capillary: 223 mg/dL — ABNORMAL HIGH (ref 70–99)
Glucose-Capillary: 217 mg/dL — ABNORMAL HIGH (ref 70–99)
Glucose-Capillary: 372 mg/dL — ABNORMAL HIGH (ref 70–99)
Glucose-Capillary: 216 mg/dL — ABNORMAL HIGH (ref 70–99)

## 2021-12-24 LAB — LIPID PANEL
Cholesterol: 161 mg/dL (ref 0–200)
HDL: 40 mg/dL — ABNORMAL LOW (ref >40)
LDL Cholesterol: 77 mg/dL (ref 0–99)
Total CHOL/HDL Ratio: 4 RATIO
Triglycerides: 219 mg/dL — ABNORMAL HIGH (ref <150)
VLDL: 44 mg/dL — ABNORMAL HIGH (ref 0–40)

## 2021-12-24 LAB — HEMOGLOBIN A1C
Hgb A1c MFr Bld: 7.5 % — ABNORMAL HIGH (ref 4.8–5.6)
Mean Plasma Glucose: 168.55 mg/dL

## 2021-12-24 LAB — ECHOCARDIOGRAM COMPLETE
Area-P 1/2: 3.72 cm2
Height: 66 in
S' Lateral: 5.1 cm
Weight: 3472 oz

## 2021-12-24 MED ORDER — REGADENOSON 0.4 MG/5ML IV SOLN
0.4000 mg | Freq: Once | INTRAVENOUS | Status: AC
  Administered 2021-12-24: 0.4 mg via INTRAVENOUS
  Filled 2021-12-24: qty 5

## 2021-12-24 MED ORDER — TECHNETIUM TC 99M TETROFOSMIN IV KIT: 10.4500 | PACK | Freq: Once | INTRAVENOUS | Status: DC | PRN

## 2021-12-24 MED ORDER — FUROSEMIDE 10 MG/ML IJ SOLN
40.0000 mg | Freq: Every day | INTRAMUSCULAR | Status: DC
  Administered 2021-12-25: 40 mg via INTRAVENOUS
  Filled 2021-12-24: qty 4

## 2021-12-24 MED ORDER — MAGNESIUM SULFATE 2 GM/50ML IV SOLN
2.0000 g | Freq: Once | INTRAVENOUS | Status: AC
  Administered 2021-12-24: 2 g via INTRAVENOUS
  Filled 2021-12-24: qty 50

## 2021-12-24 MED ORDER — FUROSEMIDE 10 MG/ML IJ SOLN
40.0000 mg | Freq: Once | INTRAMUSCULAR | Status: AC
  Administered 2021-12-24: 40 mg via INTRAVENOUS
  Filled 2021-12-24: qty 4

## 2021-12-24 MED ORDER — TECHNETIUM TC 99M TETROFOSMIN IV KIT
10.4500 | PACK | Freq: Once | INTRAVENOUS | Status: AC | PRN
  Administered 2021-12-24: 10.45 via INTRAVENOUS

## 2021-12-24 MED ORDER — TECHNETIUM TC 99M TETROFOSMIN IV KIT
30.4000 | PACK | Freq: Once | INTRAVENOUS | Status: AC | PRN
  Administered 2021-12-24: 30.4 via INTRAVENOUS

## 2021-12-24 MED ORDER — BUTALBITAL-APAP-CAFFEINE 50-325-40 MG PO TABS
1.0000 | ORAL_TABLET | Freq: Four times a day (QID) | ORAL | Status: DC | PRN
  Administered 2021-12-24 – 2021-12-26 (×4): 1 via ORAL
  Filled 2021-12-24 (×4): qty 1

## 2021-12-24 NOTE — Progress Notes (Signed)
Patient requested to take a shower. Notified MD and Md stated that he was not to come off of oxygen to take a shower. Informed patient, and patient stated, " I don't care, I am taking a shower.". Patient stated to walk around the room without oxygen and  did not want to be connected to the telemetry box. Notified MD that patient did not want to wear his telemetry box. MD called patient and informed patient that he needed to stay on the telemetry box and  oxygen. Patient stated he was going to do what he wants.

## 2021-12-24 NOTE — Assessment & Plan Note (Addendum)
Patient coming in with chest pain and shortness of breath.  EF 30 to 35% on echocardiogram and even lower on stress test at 14%.  Lasix switched to 40 mg orally daily as per cardiology.  Hold Entresto for now.  Continue Coreg and Aldactone at lower doses.  Patient has stopped all of his medications before coming in and so new prescriptions given.  Cardiology plans on doing cardiac catheterization tomorrow.  Only diuresed about 1. 6 L.  Weight with little change from admission.

## 2021-12-24 NOTE — Progress Notes (Signed)
Progress Note  Patient Name: [name]. Date of Encounter: 12/24/2021  [facility] Cardiologist: Lorine Bears, MD   Subjective   Plan for [facility] lexiscan today. No chest pain overnight. He is on 3L O2, not on O2 at baseline.   Inpatient Medications    Scheduled Meds:  carvedilol  3.125 mg Oral BID WC   clopidogrel  75 mg Oral Daily   enoxaparin (LOVENOX) injection  0.5 mg/kg Subcutaneous Q24H   insulin aspart  0-15 Units Subcutaneous TID WC   insulin aspart  0-5 Units Subcutaneous QHS   insulin glargine-yfgn  5 Units Subcutaneous BID   isosorbide mononitrate  15 mg Oral Daily   prochlorperazine  10 mg Intravenous Once   sacubitril-valsartan  1 tablet Oral BID   spironolactone  25 mg Oral Daily   Continuous Infusions:  PRN Meds: acetaminophen **OR** acetaminophen, nitroGLYCERIN, ondansetron **OR** ondansetron (ZOFRAN) IV, polyethylene glycol   Vital Signs    Vitals:   12/23/21 2315 12/24/21 0442 12/24/21 0500 12/24/21 0753  BP: (!) 136/94 108/85  (!) 135/91  Pulse: 85 65  63  Resp: 20 20  19   Temp: 98 F (36.7 C) 97.7 F (36.5 C)  98.3 F (36.8 C)  TempSrc:    Oral  SpO2: 94% 97%  94%  Weight:   95.2 kg   Height:       No intake or output data in the 24 hours ending 12/24/21 1016    12/24/2021    5:00 AM 12/23/2021    9:00 PM 12/23/2021    9:54 AM  Last 3 Weights  Weight (lbs) 209 lb 14.1 oz 209 lb 7 oz 217 lb  Weight (kg) 95.2 kg 95 kg 98.431 kg      Telemetry    NSR, PVCs, HR 50-60s - Personally Reviewed  ECG     No new- Personally Reviewed  Physical Exam   GEN: No acute distress.   Neck: No JVD Cardiac: RRR, no murmurs, rubs, or gallops.  Respiratory: diminished breath sounds GI: Soft, nontender, non-distended  MS: No edema; No deformity. Neuro:  Nonfocal  Psych: Normal affect   Labs    High Sensitivity Troponin:   Recent Labs  Lab 12/23/21 0957 12/23/21 1235 12/23/21 1600 12/23/21 2004  TROPONINIHS 30* 21* 22* 32*      Chemistry Recent Labs  Lab 12/23/21 0957 12/24/21 0506  NA 139 139  K 3.4* 3.5  CL 105 105  CO2 25 24  GLUCOSE 182* 235*  BUN 9 23  CREATININE 0.82 0.97  CALCIUM 9.0 9.3  PROT 7.6  --   ALBUMIN 4.0  --   AST 15  --   ALT 14  --   ALKPHOS 75  --   BILITOT 1.8*  --   GFRNONAA >60 >60  ANIONGAP 9 10    Lipids  Recent Labs  Lab 12/24/21 0506  CHOL 161  TRIG 219*  HDL 40*  LDLCALC 77  CHOLHDL 4.0    Hematology Recent Labs  Lab 12/23/21 0957  WBC 13.4*  RBC 5.52  HGB 16.2  HCT 47.7  MCV 86.4  MCH 29.3  MCHC 34.0  RDW 14.5  PLT 263   Thyroid No results for input(s): "TSH", "FREET4" in the last 168 hours.  BNP Recent Labs  Lab 12/23/21 0957  BNP 585.1*    DDimer  Recent Labs  Lab 12/23/21 1600  DDIMER 0.67*     Radiology    CT Head Wo Contrast  Result Date: 12/23/2021 CLINICAL DATA:  Headache EXAM: CT HEAD WITHOUT CONTRAST TECHNIQUE: Contiguous axial images were obtained from the base of the skull through the vertex without intravenous contrast. RADIATION DOSE REDUCTION: This exam was performed according to the departmental dose-optimization program which includes automated exposure control, adjustment of the mA and/or kV according to patient size and/or use of iterative reconstruction technique. COMPARISON:  CT head 06/19/2021 FINDINGS: Brain: No acute intracranial hemorrhage, mass effect, or herniation. No extra-axial fluid collections. No evidence of acute territorial infarct. No hydrocephalus. Moderate cortical volume loss. Extensive patchy hypodensities throughout the periventricular and subcortical white matter, likely secondary to chronic microvascular ischemic changes. Small old infarct in the left posterior periventricular white matter. Vascular: No hyperdense vessel or unexpected calcification. Skull: Normal. Negative for fracture or focal lesion. Sinuses/Orbits: No acute finding. Chronic moderate mucosal opacification of the left sphenoid sinus.  Other: None. IMPRESSION: Chronic changes as described with no acute intracranial process identified. Electronically Signed   By: Jannifer Hick [name]   On: 12/23/2021 11:44   DG Chest 2 View  Result Date: 12/23/2021 CLINICAL DATA:  Chest pain, dizziness, and shortness of breath. EXAM: CHEST - 2 VIEW COMPARISON:  Chest radiograph 06/17/2021 FINDINGS: The cardiomediastinal silhouette is unchanged with upper limits of normal heart size and mild chronic prominence of the central pulmonary arteries. Mild scarring is noted in the right middle lobe. No acute airspace consolidation, edema, pleural effusion, or pneumothorax is identified. No acute osseous abnormality is seen. IMPRESSION: No active cardiopulmonary disease. Electronically Signed   By: Sebastian Ache [name]   On: 12/23/2021 10:24    Cardiac Studies   TTE 06/2021 1. Left ventricular ejection fraction, by estimation, is 30 to 35%. The  left ventricle has moderately decreased function. The left ventricle  demonstrates global hypokinesis. Images concerning for severe hypokinesis  of the inferior/inferoseptal wall  (image [id number]) . There is moderate left ventricular hypertrophy. Left  ventricular diastolic parameters are consistent with Grade II diastolic  dysfunction (pseudonormalization).   2. Right ventricular systolic function is mildly reduced. The right  ventricular size is normal. RVSP is 19 mm Hg + RA pressure.   3. Left atrial size was moderately dilated.   4. The mitral valve is normal in structure. Mild to moderate mitral valve  regurgitation. No evidence of mitral stenosis.   5. The aortic valve is normal in structure. Aortic valve regurgitation is  not visualized. Aortic valve sclerosis/calcification is present, without  any evidence of aortic stenosis. \  LHC 10/2019 Conclusion    1st Diag lesion is 60% stenosed. Prox LAD to Mid LAD lesion is 10% stenosed. 2nd Diag lesion is 100% stenosed. 1st Mrg lesion is 100%  stenosed. Mid Cx to Dist Cx lesion is 100% stenosed. Prox RCA to Mid RCA lesion is 100% stenosed. Dist RCA lesion is 70% stenosed. 2nd Mrg lesion is 60% stenosed. Post intervention, there is a 0% residual stenosis. A drug-eluting stent was successfully placed using a STENT RESOLUTE ONYX [id number].   1.  Significant underlying three-vessel coronary artery disease with patent stents in the LAD and diagonal with moderate in-stent restenosis in the diagonal stent.  Chronically occluded RCA stents with right to right bridging and left-to-right collaterals.  Diffuse small vessel disease.    The mid/distal left circumflex which was significantly diseased recently is now completely occluded which is the likely culprit for myocardial infarction.  This supplies relatively small size OM 3 which has moderate diffuse atherosclerosis. 2.  Left ventricular  angiography was not performed.  EF was 25 to 30% by echo. 3.  Severely elevated left ventricular end-diastolic pressure at 33 mmHg 4.  Successful angioplasty and drug-eluting stent placement to the left circumflex.   Recommendations: I elected to intervene on the left circumflex given that troponin was still rising and the patient with residual chest pain. Dual antiplatelet therapy for at least 1 year. Resume heart failure medications. Resume furosemide as the patient is significantly volume overloaded. Possible discharge home tomorrow.    Coronary Diagrams  Diagnostic Dominance: Right  Intervention    Patient Profile     66 y.o. male with a h/o CAD/PCI, CTO RCA, ischemic cardiomyopathy EF 30 to 35%, former smoker x20+, medication noncompliance 12/23/2021 for the evaluation of chest pain, shortness of breath.  Assessment & Plan    Chest pain CAD s/p multiple PCIs, most recent 2021 - chest pain more atypical - HS trop 30>21 - continue Imdur, Coreg, ASA, plavix - echo ordered - LHC in 2021, report above with stent to the LCx - plan for  Myoview today - question on medication noncompliance  ICM EF 30-35% HFrEF - CXR negative in the ER. BNP 585 - IV lasix x1 in the ED - EF in the past down to 20-25% - Entresto 97-179mBID - spironolactone 25mg  daily - Coreg 3.125mg BID - restart PTA Jardiance as able - PTA lasix 40mg  daily - he is on 3L O2,can given another IV lasix dose  HTN - elevated on admission - Entresto 97-103mg BID - Spironolactone 25mg  daily - coreg - BP better  HLD - LDL 77 - allergy to statins - can consider PCSK9i at OP  For questions or updates, please contact [facility] Please consult [email or web address] for contact info under        Signed, Annaliese Saez Ninfa Meeker, PA-C  12/24/2021, 10:16 AM

## 2021-12-24 NOTE — Assessment & Plan Note (Addendum)
Pulse ox documented 89% on room air.  The other day dropped down to 84% with ambulation.  Yesterday with ambulation was able to hold his saturations in the 90s. Continue off oxygen at this point.  Appears to be more pulmonary in nature than cardiac

## 2021-12-24 NOTE — Progress Notes (Signed)
Progress Note   Patient: [name]. OAC:[id number] DOB: 11-24-1955 DOA: 12/23/2021     0 DOS: the patient was seen and examined on 12/24/2021     Assessment and Plan: * Acute on chronic systolic CHF (congestive heart failure) (HCC) Patient coming in with chest pain and shortness of breath.  EF 30 to 35% on echocardiogram and even lower on stress test at 14%.  Given a dose of IV Lasix today we will continue IV Lasix daily.  Patient on Jardiance Aldactone and Entresto. Low-dose Coreg started.  Acute respiratory failure with hypoxia (HCC) Pulse ox documented 89% on room air.  Requiring oxygen currently with pulse ox dropping down in the mid 80s as per nursing staff off oxygen.  Continue oxygen supplementation.  Chest pain Chest pain yesterday.  Troponin 21, 22 and 32.  Stress test shows cardiomyopathy.  Headache IV magnesium ordered earlier.  Will give as needed Fioricet.  Essential hypertension Continue Entresto, Aldactone, low-dose Coreg, Imdur, IV Lasix.  CAD (coronary artery disease) Continue aspirin and Plavix and Coreg.   Hyperlipidemia Myalgias with cholesterol medications.  Type 2 diabetes mellitus with hyperglycemia (HCC) Patient did not know that he is a diabetic.  Med patient reconciliation shows he was on Jardiance and metformin.  Hemoglobin A1c 7.5.   Patient told me that he is going to take a shower and does not care what I think.  I told him that his pulse ox is low off the oxygen currently and not be a good idea to take off the oxygen.  I will have the nursing staff check his oxygen level once he is out of the shower and document how low he goes.     Subjective: Patient not feeling well.  Complains of shortness of breath and had some chest pain yesterday.  Currently did not have any chest pain today.  Has some swelling of the lower extremities.  Physical Exam: Vitals:   12/24/21 0500 12/24/21 0753 12/24/21 1330 12/24/21 1605  BP:  (!) 135/91 (!) 148/90  128/88  Pulse:  63 60 70  Resp:  19 20 (!) 21  Temp:  98.3 F (36.8 C) 97.7 F (36.5 C) 97.8 F (36.6 C)  TempSrc:  Oral Oral Oral  SpO2:  94% 97% 94%  Weight: 95.2 kg     Height:       Physical Exam HENT:     Head: Normocephalic.     Mouth/Throat:     Pharynx: No oropharyngeal exudate.  Eyes:     General: Lids are normal.     Conjunctiva/sclera: Conjunctivae normal.  Cardiovascular:     Rate and Rhythm: Normal rate and regular rhythm.     Heart sounds: Normal heart sounds, S1 normal and S2 normal.  Pulmonary:     Breath sounds: Examination of the right-lower field reveals decreased breath sounds and rales. Examination of the left-lower field reveals decreased breath sounds and rales. Decreased breath sounds and rales present. No wheezing or rhonchi.  Abdominal:     Palpations: Abdomen is soft.     Tenderness: There is no abdominal tenderness.  Musculoskeletal:     Right lower leg: Swelling present.     Left lower leg: Swelling present.  Skin:    General: Skin is warm.     Findings: No rash.  Neurological:     Mental Status: He is alert and oriented to person, place, and time.     Data Reviewed: Stress test shows an EF estimated at  14%.  No EKG changes concerning for ischemia, CT attenuation correlation images with three-vessel coronary calcification, high risk and in the setting of cardiomyopathy. Echocardiogram showed an EF of 30 to 35% Hemoglobin A1c 7.5, D-dimer 0.67, LDL 77, BNP 585, creatinine 0.97, potassium 3.5  Family Communication: Updated son on the phone  Disposition: Status is: Inpatient Remains inpatient appropriate because: Patient being treated for CHF with iv lasix  Planned Discharge Destination: Home   Author: Alford Highland, MD 12/24/2021 5:07 PM  For on call review [email or web address].

## 2021-12-25 ENCOUNTER — Inpatient Hospital Stay: Payer: Self-pay

## 2021-12-25 DIAGNOSIS — I25118 Atherosclerotic heart disease of native coronary artery with other forms of angina pectoris: Secondary | ICD-10-CM

## 2021-12-25 DIAGNOSIS — I5023 Acute on chronic systolic (congestive) heart failure: Secondary | ICD-10-CM

## 2021-12-25 DIAGNOSIS — R079 Chest pain, unspecified: Secondary | ICD-10-CM

## 2021-12-25 DIAGNOSIS — I208 Other forms of angina pectoris: Secondary | ICD-10-CM

## 2021-12-25 DIAGNOSIS — I509 Heart failure, unspecified: Secondary | ICD-10-CM

## 2021-12-25 DIAGNOSIS — J9601 Acute respiratory failure with hypoxia: Secondary | ICD-10-CM

## 2021-12-25 DIAGNOSIS — E1165 Type 2 diabetes mellitus with hyperglycemia: Secondary | ICD-10-CM

## 2021-12-25 LAB — CBC
HCT: 49.5 % (ref 39.0–52.0)
Hemoglobin: 16.9 g/dL (ref 13.0–17.0)
MCH: 29.5 pg (ref 26.0–34.0)
MCHC: 34.1 g/dL (ref 30.0–36.0)
MCV: 86.5 fL (ref 80.0–100.0)
Platelets: 239 10*3/uL (ref 150–400)
RBC: 5.72 MIL/uL (ref 4.22–5.81)
RDW: 14.5 % (ref 11.5–15.5)
WBC: 12.3 10*3/uL — ABNORMAL HIGH (ref 4.0–10.5)
nRBC: 0 % (ref 0.0–0.2)

## 2021-12-25 LAB — BASIC METABOLIC PANEL
Anion gap: 5 (ref 5–15)
BUN: 23 mg/dL (ref 8–23)
CO2: 24 mmol/L (ref 22–32)
Calcium: 9.1 mg/dL (ref 8.9–10.3)
Chloride: 112 mmol/L — ABNORMAL HIGH (ref 98–111)
Creatinine, Ser: 0.84 mg/dL (ref 0.61–1.24)
GFR, Estimated: >60 mL/min (ref >60)
Glucose, Bld: 193 mg/dL — ABNORMAL HIGH (ref 70–99)
Potassium: 4 mmol/L (ref 3.5–5.1)
Sodium: 141 mmol/L (ref 135–145)

## 2021-12-25 LAB — GLUCOSE, CAPILLARY
Glucose-Capillary: 186 mg/dL — ABNORMAL HIGH (ref 70–99)
Glucose-Capillary: 287 mg/dL — ABNORMAL HIGH (ref 70–99)
Glucose-Capillary: 181 mg/dL — ABNORMAL HIGH (ref 70–99)

## 2021-12-25 MED ORDER — MOMETASONE FURO-FORMOTEROL FUM 200-5 MCG/ACT IN AERO
2.0000 | INHALATION_SPRAY | Freq: Two times a day (BID) | RESPIRATORY_TRACT | Status: DC
  Administered 2021-12-25 – 2021-12-31 (×12): 2 via RESPIRATORY_TRACT
  Filled 2021-12-25: qty 8.8

## 2021-12-25 MED ORDER — ALBUTEROL SULFATE (2.5 MG/3ML) 0.083% IN NEBU
3.0000 mL | INHALATION_SOLUTION | Freq: Four times a day (QID) | RESPIRATORY_TRACT | Status: DC | PRN
Start: 2021-12-25 — End: 2022-01-01
  Administered 2021-12-28 – 2021-12-29 (×2): 3 mL via RESPIRATORY_TRACT
  Filled 2021-12-25 (×2): qty 3

## 2021-12-25 MED ORDER — FUROSEMIDE 10 MG/ML IJ SOLN
40.0000 mg | Freq: Two times a day (BID) | INTRAMUSCULAR | Status: DC
  Administered 2021-12-25 – 2021-12-26 (×2): 40 mg via INTRAVENOUS
  Filled 2021-12-25 (×2): qty 4

## 2021-12-26 ENCOUNTER — Inpatient Hospital Stay: Payer: Self-pay

## 2021-12-26 DIAGNOSIS — E782 Mixed hyperlipidemia: Secondary | ICD-10-CM

## 2021-12-26 DIAGNOSIS — I639 Cerebral infarction, unspecified: Secondary | ICD-10-CM

## 2021-12-26 LAB — GLUCOSE, CAPILLARY
Glucose-Capillary: 261 mg/dL — ABNORMAL HIGH (ref 70–99)
Glucose-Capillary: 177 mg/dL — ABNORMAL HIGH (ref 70–99)
Glucose-Capillary: 218 mg/dL — ABNORMAL HIGH (ref 70–99)
Glucose-Capillary: 309 mg/dL — ABNORMAL HIGH (ref 70–99)
Glucose-Capillary: 250 mg/dL — ABNORMAL HIGH (ref 70–99)

## 2021-12-26 LAB — BASIC METABOLIC PANEL
Anion gap: 7 (ref 5–15)
BUN: 27 mg/dL — ABNORMAL HIGH (ref 8–23)
CO2: 24 mmol/L (ref 22–32)
Calcium: 8.9 mg/dL (ref 8.9–10.3)
Chloride: 105 mmol/L (ref 98–111)
Creatinine, Ser: 1.07 mg/dL (ref 0.61–1.24)
GFR, Estimated: >60 mL/min (ref >60)
Glucose, Bld: 241 mg/dL — ABNORMAL HIGH (ref 70–99)
Potassium: 3.6 mmol/L (ref 3.5–5.1)
Sodium: 136 mmol/L (ref 135–145)

## 2021-12-26 MED ORDER — CARVEDILOL 3.125 MG PO TABS
3.1250 mg | ORAL_TABLET | Freq: Two times a day (BID) | ORAL | Status: DC
  Administered 2021-12-26 – 2021-12-31 (×9): 3.125 mg via ORAL
  Filled 2021-12-26 (×9): qty 1

## 2021-12-26 MED ORDER — SPIRONOLACTONE 25 MG PO TABS: 12.5000 mg | ORAL_TABLET | Freq: Every day | ORAL | Status: DC

## 2021-12-26 MED ORDER — SACUBITRIL-VALSARTAN 24-26 MG PO TABS: 1.0000 | ORAL_TABLET | Freq: Two times a day (BID) | ORAL | Status: DC

## 2021-12-26 MED ORDER — FUROSEMIDE 10 MG/ML IJ SOLN: 80.0000 mg | Freq: Two times a day (BID) | INTRAMUSCULAR | Status: DC

## 2021-12-26 MED ORDER — EZETIMIBE 10 MG PO TABS
10.0000 mg | ORAL_TABLET | Freq: Every day | ORAL | Status: DC
  Administered 2021-12-27 – 2021-12-31 (×5): 10 mg via ORAL
  Filled 2021-12-26 (×5): qty 1

## 2021-12-26 MED ORDER — GADOBUTROL 1 MMOL/ML IV SOLN
10.0000 mL | Freq: Once | INTRAVENOUS | Status: AC | PRN
  Administered 2021-12-26: 10 mL via INTRAVENOUS

## 2021-12-26 MED ORDER — ASPIRIN 81 MG PO CHEW
324.0000 mg | CHEWABLE_TABLET | Freq: Once | ORAL | Status: AC
  Administered 2021-12-26: 324 mg via ORAL
  Filled 2021-12-26: qty 4

## 2021-12-26 MED ORDER — FUROSEMIDE 10 MG/ML IJ SOLN
40.0000 mg | Freq: Two times a day (BID) | INTRAMUSCULAR | Status: DC
  Administered 2021-12-26 – 2021-12-27 (×3): 40 mg via INTRAVENOUS
  Filled 2021-12-26 (×3): qty 4

## 2021-12-26 MED ORDER — CARVEDILOL 3.125 MG PO TABS: 3.1250 mg | ORAL_TABLET | Freq: Two times a day (BID) | ORAL | Status: DC

## 2021-12-26 MED ORDER — SACUBITRIL-VALSARTAN 24-26 MG PO TABS
1.0000 | ORAL_TABLET | Freq: Two times a day (BID) | ORAL | Status: DC
  Administered 2021-12-26 – 2021-12-28 (×4): 1 via ORAL
  Filled 2021-12-26 (×4): qty 1

## 2021-12-26 MED ORDER — ASPIRIN 81 MG PO TBEC
81.0000 mg | DELAYED_RELEASE_TABLET | Freq: Every day | ORAL | Status: DC
  Administered 2021-12-27 – 2021-12-31 (×4): 81 mg via ORAL
  Filled 2021-12-26 (×4): qty 1

## 2021-12-26 MED ORDER — SPIRONOLACTONE 25 MG PO TABS: 25.0000 mg | ORAL_TABLET | Freq: Every day | ORAL | Status: DC

## 2021-12-26 NOTE — Progress Notes (Addendum)
Progress Note  Patient Name: [name]. Date of Encounter: 12/26/2021  [facility] Cardiologist: Lorine Bears, MD   Subjective   I/Os not accurate. Patient still feeling short of breath on 2-3 L O2. No chest pain.  Inpatient Medications    Scheduled Meds:  carvedilol  3.125 mg Oral BID WC   clopidogrel  75 mg Oral Daily   enoxaparin (LOVENOX) injection  0.5 mg/kg Subcutaneous Q24H   furosemide  40 mg Intravenous BID   insulin aspart  0-15 Units Subcutaneous TID WC   insulin aspart  0-5 Units Subcutaneous QHS   insulin glargine-yfgn  5 Units Subcutaneous BID   isosorbide mononitrate  15 mg Oral Daily   mometasone-formoterol  2 puff Inhalation BID   prochlorperazine  10 mg Intravenous Once   sacubitril-valsartan  1 tablet Oral BID   spironolactone  25 mg Oral Daily   Continuous Infusions:  PRN Meds: acetaminophen **OR** acetaminophen, albuterol, butalbital-acetaminophen-caffeine, nitroGLYCERIN, ondansetron **OR** ondansetron (ZOFRAN) IV, polyethylene glycol, technetium tetrofosmin   Vital Signs    Vitals:   12/26/21 0111 12/26/21 0130 12/26/21 0432 12/26/21 0804  BP: (!) 85/50 110/62 107/64 120/76  Pulse: 74  (!) 52 (!) 57  Resp: 18  18 18   Temp: 98.5 F (36.9 C)  98.4 F (36.9 C) 98.1 F (36.7 C)  TempSrc:    Oral  SpO2: 97%  94% 94%  Weight:   94.8 kg   Height:        Intake/Output Summary (Last 24 hours) at 12/26/2021 0813 Last data filed at 12/26/2021 0737 Gross per 24 hour  Intake 720 ml  Output --  Net 720 ml      12/26/2021    4:32 AM 12/25/2021    5:00 AM 12/24/2021    5:00 AM  Last 3 Weights  Weight (lbs) 208 lb 15.9 oz 208 lb 8.9 oz 209 lb 14.1 oz  Weight (kg) 94.8 kg 94.6 kg 95.2 kg      Telemetry    NSR PVCs, HR 60s - Personally Reviewed  ECG    NO new - Personally Reviewed  Physical Exam   GEN: No acute distress.   Neck: No JVD Cardiac: RRR, no murmurs, rubs, or gallops.  Respiratory: Clear to auscultation  bilaterally. GI: Soft, nontender, non-distended  MS: No edema; No deformity. Neuro:  Nonfocal  Psych: Normal affect   Labs    High Sensitivity Troponin:   Recent Labs  Lab 12/23/21 0957 12/23/21 1235 12/23/21 1600 12/23/21 2004  TROPONINIHS 30* 21* 22* 32*     Chemistry Recent Labs  Lab 12/23/21 0957 12/24/21 0506 12/25/21 0629  NA 139 139 141  K 3.4* 3.5 4.0  CL 105 105 112*  CO2 25 24 24   GLUCOSE 182* 235* 193*  BUN 9 23 23   CREATININE 0.82 0.97 0.84  CALCIUM 9.0 9.3 9.1  PROT 7.6  --   --   ALBUMIN 4.0  --   --   AST 15  --   --   ALT 14  --   --   ALKPHOS 75  --   --   BILITOT 1.8*  --   --   GFRNONAA >60 >60 >60  ANIONGAP 9 10 5     Lipids  Recent Labs  Lab 12/24/21 0506  CHOL 161  TRIG 219*  HDL 40*  LDLCALC 77  CHOLHDL 4.0    Hematology Recent Labs  Lab 12/23/21 0957 12/25/21 0629  WBC 13.4* 12.3*  RBC  5.52 5.72  HGB 16.2 16.9  HCT 47.7 49.5  MCV 86.4 86.5  MCH 29.3 29.5  MCHC 34.0 34.1  RDW 14.5 14.5  PLT 263 239   Thyroid No results for input(s): "TSH", "FREET4" in the last 168 hours.  BNP Recent Labs  Lab 12/23/21 0957  BNP 585.1*    DDimer  Recent Labs  Lab 12/23/21 1600  DDIMER 0.67*     Radiology    NM Myocar Multi W/Spect W/Wall Motion / EF  Result Date: 12/24/2021 Pharmacological myocardial perfusion imaging study with no significant  ischemia Fixed defect in the inferior, inferolateral and apical region Inferior and inferolateral wall and apical wall hypokinesis , EF estimated at 14% No EKG changes concerning for ischemia at peak stress or in recovery. CT attenuation correction images with three-vessel coronary calcification High risk scan in the setting of cardiomyopathy Signed, Dossie Arbour, MD, Ph.D [facility]    Cardiac Studies   Myoview Lexiscan 12/24/21 Narrative & Impression  Pharmacological myocardial perfusion imaging study with no significant  ischemia Fixed defect in the inferior, inferolateral and  apical region Inferior and inferolateral wall and apical wall hypokinesis , EF estimated at 14% No EKG changes concerning for ischemia at peak stress or in recovery. CT attenuation correction images with three-vessel coronary calcification High risk scan in the setting of cardiomyopathy     Signed, Dossie Arbour, MD, Ph.D [facility]      Echo 12/23/21  1. Left ventricular ejection fraction, by estimation, is 30 to 35%. The  left ventricle has moderately decreased function. The left ventricle  demonstrates global hypokinesis. Left ventricular diastolic parameters are  consistent with Grade I diastolic  dysfunction (impaired relaxation).   2. Right ventricular systolic function is normal. The right ventricular  size is normal. Tricuspid regurgitation signal is inadequate for assessing  PA pressure.   3. The mitral valve is normal in structure. Mild mitral valve  regurgitation. No evidence of mitral stenosis.   4. The aortic valve is normal in structure. Aortic valve regurgitation is  not visualized. Aortic valve sclerosis is present, with no evidence of  aortic valve stenosis.    TTE 06/2021 1. Left ventricular ejection fraction, by estimation, is 30 to 35%. The  left ventricle has moderately decreased function. The left ventricle  demonstrates global hypokinesis. Images concerning for severe hypokinesis  of the inferior/inferoseptal wall  (image 16-19) . There is moderate left ventricular hypertrophy. Left  ventricular diastolic parameters are consistent with Grade II diastolic  dysfunction (pseudonormalization).   2. Right ventricular systolic function is mildly reduced. The right  ventricular size is normal. RVSP is 19 mm Hg + RA pressure.   3. Left atrial size was moderately dilated.   4. The mitral valve is normal in structure. Mild to moderate mitral valve  regurgitation. No evidence of mitral stenosis.   5. The aortic valve is normal in structure. Aortic valve  regurgitation is  not visualized. Aortic valve sclerosis/calcification is present, without  any evidence of aortic stenosis. \   LHC 10/2019 Conclusion     1st Diag lesion is 60% stenosed. Prox LAD to Mid LAD lesion is 10% stenosed. 2nd Diag lesion is 100% stenosed. 1st Mrg lesion is 100% stenosed. Mid Cx to Dist Cx lesion is 100% stenosed. Prox RCA to Mid RCA lesion is 100% stenosed. Dist RCA lesion is 70% stenosed. 2nd Mrg lesion is 60% stenosed. Post intervention, there is a 0% residual stenosis. A drug-eluting stent was successfully placed using  a STENT RESOLUTE ONYX [id number].   1.  Significant underlying three-vessel coronary artery disease with patent stents in the LAD and diagonal with moderate in-stent restenosis in the diagonal stent.  Chronically occluded RCA stents with right to right bridging and left-to-right collaterals.  Diffuse small vessel disease.    The mid/distal left circumflex which was significantly diseased recently is now completely occluded which is the likely culprit for myocardial infarction.  This supplies relatively small size OM 3 which has moderate diffuse atherosclerosis. 2.  Left ventricular angiography was not performed.  EF was 25 to 30% by echo. 3.  Severely elevated left ventricular end-diastolic pressure at 33 mmHg 4.  Successful angioplasty and drug-eluting stent placement to the left circumflex.   Recommendations: I elected to intervene on the left circumflex given that troponin was still rising and the patient with residual chest pain. Dual antiplatelet therapy for at least 1 year. Resume heart failure medications. Resume furosemide as the patient is significantly volume overloaded. Possible discharge home tomorrow.    Coronary Diagrams   Diagnostic Dominance: Right  Intervention          Patient Profile     66 y.o. male with a h/o CAD/PCI, CTO RCA, ischemic cardiomyopathy EF 30 to 35%, former smoker x20+, medication noncompliance  12/23/2021 for the evaluation of chest pain, shortness of breath.  Assessment & Plan    Chest pain CAD s/p multiple PCIs, most recent 2021 - chest pain more atypical - LHC in 2021, report above with stent to the LCx - HS trop 30>21 - continue Imdur, Coreg, ASA, plavix - echo showed LVEF 30-35%, G1DD, mild MR - MPI this admission showed no signficant ischemia, fixed defect in the inferior, inferolateral and apical region, high risk scan given CM - continue medications. IF SOB continues may end up needing heart cath.    Shortness of breath ICM EF 30-35% HFrEF - EF in the past down to 20-25%. Echo this admission showed LVEF 30-35%, G1DD - CXR negative in the ER. BNP 585. IV lasix x1 in the ED -continue Entresto 97-196mBID, spironolactone 25mg  daily, Coreg 3.125mg BID - restart PTA Jardiance as able - PTA lasix 40mg  daily, however question of noncompliance -IV lasix 40mg  BID - I/Os not recorded - kidney function stable - continue with diuresis. May end up needing R/L heart cath if breathing doesn't improve, also consider lung eitology.    HTN - elevated on admission - Entresto 97-103mg BID - Spironolactone 25mg  daily - coreg 3.125mg  BID - BP intermittently soft   HLD - LDL 77 - allergy to statins - can consider PCSK9i as OP  For questions or updates, please contact [facility] Please consult [email or web address] for contact info under     Signed, Cadence Ardelle Lesches  12/26/2021, 8:13 AM     Cardiology attending  Patient seen and examined.  Agree with the findings as noted above.  The patient remains persistently dyspneic.  Data above reviewed.  I am concerned about progression of his coronary disease now to over 2 years out from prior PCI.  I wonder about the status of his LAD stent.  He certainly has chronic ischemia with his occluded RCA.  He is on maximal medical therapy.  His exam demonstrates minimal rales in the bases of lungs.  Cardiovascular exam revealed a regular rate  and rhythm.  Extremities demonstrate no edema.  Telemetry demonstrates sinus rhythm. Assessment and plan 1.  Acute on chronic systolic heart failure -there is some disparity in his  actual LV dysfunction.  Echo demonstrates 30% but nuclear scan 14%.  He certainly does not look to be any distress and his exam is not particularly worrisome for volume overload.  I think right and left heart cath will be required to ultimately better understand the mechanism of his dyspnea.  If his pulmonary pressures are low then a pulmonary component would be considered.  He will continue intravenous Lasix and I will increase from 40 to 60 mg daily. 2.  Coronary artery disease -he does not have angina but does complain of dyspnea.  He has significant three-vessel coronary disease by catheterization 2 years ago with PCI performed at that time.  He has had other percutaneous procedures done in the past.  Consider relook catheterization with both right and left heart cath.  Lewayne Bunting, MD

## 2021-12-26 NOTE — Assessment & Plan Note (Addendum)
Patient complains of unsteadiness with walking around.  MRI of the brain showed acute infarct left frontal white matter, chronic microhemorrhages.  Patient already on Plavix, although he had not been taking this medication.  Restarted during hospitalization and new prescription given for Plavix.  Aspirin x3 weeks.  Neuro consult appreciated.  Cleared by OT.  PT recommended home health PT.

## 2021-12-27 LAB — BASIC METABOLIC PANEL
Anion gap: 5 (ref 5–15)
BUN: 32 mg/dL — ABNORMAL HIGH (ref 8–23)
CO2: 25 mmol/L (ref 22–32)
Calcium: 8.9 mg/dL (ref 8.9–10.3)
Chloride: 107 mmol/L (ref 98–111)
Creatinine, Ser: 0.9 mg/dL (ref 0.61–1.24)
GFR, Estimated: >60 mL/min (ref >60)
Glucose, Bld: 211 mg/dL — ABNORMAL HIGH (ref 70–99)
Potassium: 3.4 mmol/L — ABNORMAL LOW (ref 3.5–5.1)
Sodium: 137 mmol/L (ref 135–145)

## 2021-12-27 LAB — CBC
HCT: 46.4 % (ref 39.0–52.0)
Hemoglobin: 15.7 g/dL (ref 13.0–17.0)
MCH: 29.6 pg (ref 26.0–34.0)
MCHC: 33.8 g/dL (ref 30.0–36.0)
MCV: 87.5 fL (ref 80.0–100.0)
Platelets: 232 10*3/uL (ref 150–400)
RBC: 5.3 MIL/uL (ref 4.22–5.81)
RDW: 14.3 % (ref 11.5–15.5)
WBC: 11.1 10*3/uL — ABNORMAL HIGH (ref 4.0–10.5)
nRBC: 0 % (ref 0.0–0.2)

## 2021-12-27 LAB — GLUCOSE, CAPILLARY
Glucose-Capillary: 258 mg/dL — ABNORMAL HIGH (ref 70–99)
Glucose-Capillary: 267 mg/dL — ABNORMAL HIGH (ref 70–99)
Glucose-Capillary: 269 mg/dL — ABNORMAL HIGH (ref 70–99)
Glucose-Capillary: 172 mg/dL — ABNORMAL HIGH (ref 70–99)

## 2021-12-27 MED ORDER — INSULIN GLARGINE-YFGN 100 UNIT/ML ~~LOC~~ SOLN
15.0000 [IU] | Freq: Every day | SUBCUTANEOUS | Status: DC
  Administered 2021-12-27 – 2021-12-28 (×2): 15 [IU] via SUBCUTANEOUS
  Filled 2021-12-27 (×2): qty 0.15

## 2021-12-27 MED ORDER — POTASSIUM CHLORIDE CRYS ER 20 MEQ PO TBCR
20.0000 meq | EXTENDED_RELEASE_TABLET | Freq: Once | ORAL | Status: AC
Start: 2021-12-27 — End: 2021-12-27
  Administered 2021-12-27: 20 meq via ORAL
  Filled 2021-12-27: qty 1

## 2021-12-27 MED ORDER — SPIRONOLACTONE 25 MG PO TABS
12.5000 mg | ORAL_TABLET | Freq: Every day | ORAL | Status: DC
  Administered 2021-12-27 – 2021-12-31 (×5): 12.5 mg via ORAL
  Filled 2021-12-27: qty 1
  Filled 2021-12-27 (×3): qty 0.5
  Filled 2021-12-27: qty 1
  Filled 2021-12-27 (×2): qty 0.5
  Filled 2021-12-27: qty 1

## 2021-12-27 NOTE — Evaluation (Signed)
Occupational Therapy Evaluation Patient Details Name: [name]. MRN: [id number] DOB: 1956-02-20 Today's Date: 12/27/2021   History of Present Illness Pt is a 66 y/o M admitted on 12/23/21 after presenting to the ED with c/o chest pain that woke him from his sleep. Pt is being treated for acute on chronic systolic CHF & acute respiratory failure with hypoxia. Pt also with c/o unsteadiness with gait. 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  PMH: CAD, DM2, HTN, HFrEF (EF 30-35%), dilated cardiomyopathy, dyslipidemia   Clinical Impression   Upon entering the room, pt seated in bed with LEs tucked under him. Pt remains on 1L O2 via [place] and reports SOB with O2 saturation at 96% while supine in bed. Pt reports living at home alone and being independent in all ADLs and IADLs without use of AD at baseline. He does have family nearby that can assist if needed. Pt needing encouragment to participate and limited evaluation secondary to unwillingness to demonstrate tasks. Pt reports having just returned to bed from bathroom and RN confirms pt's bed alarm going off as he returned to bed from toileting. Pt reports feeling "90% himself" during this session. Pt endorses he has not been given results of testing so no CVA education completed this session. Pt likely does not need follow up OT intervention but will continue to follow acutely to further assess as pt is willing.      Recommendations for follow up therapy are one component of a multi-disciplinary discharge planning process, led by the attending physician.  Recommendations may be updated based on patient status, additional functional criteria and insurance authorization.   Follow Up Recommendations  No OT follow up    Assistance Recommended at Discharge Intermittent Supervision/Assistance  Patient can return home with the following A little help with walking and/or transfers;A little help with  bathing/dressing/bathroom;Help with stairs or ramp for entrance;Assist for transportation    Functional Status Assessment  Patient has had a recent decline in their functional status and demonstrates the ability to make significant improvements in function in a reasonable and predictable amount of time.  Equipment Recommendations  None recommended by OT       Precautions / Restrictions Precautions Precautions: Fall Restrictions Weight Bearing Restrictions: No      Mobility Bed Mobility Overal bed mobility: Modified Independent             General bed mobility comments: supine>sit with HOB elevated, bed rails PRN    Transfers                   General transfer comment: Pt refusing      Balance   Sitting-balance support: Feet supported Sitting balance-Leahy Scale: Normal                                     ADL either performed or assessed with clinical judgement   ADL Overall ADL's : Needs assistance/impaired                                       General ADL Comments: staff reports pt ambulates to bathroom without assistance and bed alarm going off. Pt is unwilling to demonstrate any ambulation or functional transfer with therapist. He is able to don/doff socks without assistance.     Vision  Patient Visual Report: No change from baseline              Pertinent Vitals/Pain Pain Assessment Pain Assessment: No/denies pain     Hand Dominance Right   Extremity/Trunk Assessment Upper Extremity Assessment Upper Extremity Assessment: Overall WFL for tasks assessed   Lower Extremity Assessment Lower Extremity Assessment:  (not able to assess 2/2 pt's decreased willingness)       Communication Communication Communication: No difficulties   Cognition Arousal/Alertness: Awake/alert Behavior During Therapy: WFL for tasks assessed/performed Overall Cognitive Status: Within Functional Limits for tasks assessed                                                   Home Living Family/patient expects to be discharged to:: Private residence Living Arrangements: Alone Available Help at Discharge: Family;Available PRN/intermittently Type of Home: House Home Access: Stairs to enter [employer] of Steps: 2 Entrance Stairs-Rails: None Home Layout: Two level Alternate Level Stairs-Number of Steps: 2 steps without rails   Bathroom Shower/Tub: Tub/shower unit;Walk-in shower   Bathroom Toilet: Standard     Home Equipment: None          Prior Functioning/Environment Prior Level of Function : Driving;Independent/Modified Independent             Mobility Comments: denies falls          OT Problem List: Cardiopulmonary status limiting activity;Decreased safety awareness      OT Treatment/Interventions: Self-care/ADL training;Balance training;Therapeutic exercise;Neuromuscular education;Therapeutic activities;Energy conservation;DME and/or AE instruction;Visual/perceptual remediation/compensation;Patient/family education    OT Goals(Current goals can be found in the care plan section) Acute Rehab OT Goals Patient Stated Goal: to go home OT Goal Formulation: With patient Time For Goal Achievement: 01/10/22 Potential to Achieve Goals: Fair ADL Goals Pt Will Perform Grooming: Independently Pt Will Perform Lower Body Dressing: Independently;sit to/from stand Pt Will Transfer to Toilet: Independently;ambulating Pt Will Perform Toileting - Clothing Manipulation and hygiene: Independently;sit to/from stand  OT Frequency: Min 3X/week       AM-PAC OT "6 Clicks" Daily Activity     Outcome Measure Help from another person eating meals?: None Help from another person taking care of personal grooming?: None Help from another person toileting, which includes using toliet, bedpan, or urinal?: A Little Help from another person bathing (including washing, rinsing, drying)?: A  Little Help from another person to put on and taking off regular upper body clothing?: None Help from another person to put on and taking off regular lower body clothing?: A Little 6 Click Score: 21   End of Session Equipment Utilized During Treatment: Oxygen Nurse Communication: Mobility status  Activity Tolerance: Patient tolerated treatment well Patient left: in bed;with call bell/phone within reach  OT Visit Diagnosis: Other (comment) (CVA) Hemiplegia - caused by: Cerebral infarction                Time: 1324-4010 OT Time Calculation (min): 9 min Charges:  OT General Charges $OT Visit: 1 Visit OT Evaluation $OT Eval Low Complexity: 1 Low  Jackquline Denmark, MS, OTR/L , CBIS ascom [phone]  12/27/21, 10:56 AM

## 2021-12-27 NOTE — Evaluation (Signed)
Physical Therapy Evaluation Patient Details Name: [name]. MRN: [id number] DOB: [birth year] Today's Date: 12/27/2021  History of Present Illness  Pt is a 66 y/o M admitted on 12/23/21 after presenting to the ED with c/o chest pain that woke him from his sleep. Pt is being treated for acute on chronic systolic CHF & acute respiratory failure with hypoxia. Pt also with c/o unsteadiness with gait. 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  PMH: CAD, DM2, HTN, HFrEF (EF 30-35%), dilated cardiomyopathy, dyslipidemia  Clinical Impression  Pt seen for PT evaluation with pt reporting he was independent, driving, without falls prior to admission. Pt reports he lives in a split level home with 2 steps without rails at 2 separate points in his home. Pt transfers supine>sitting with Mod I but denies any further mobility despite encouragement (to walk, sit in recliner) due to decreased willingness to participate & pt reporting dizziness despite prolonged seated break (pt endorses dizziness is chronic & nothing really helps it). Pt reports "I'm going home with or without you coming in here".  Anticipate pt can mobilize well once willing. Pt on received on 1L/min via nasal cannula, increased to 2L/min as SPO2 89-90% but able to wean back down to 1L/min & pt tolerate >/= 90%.      Recommendations for follow up therapy are one component of a multi-disciplinary discharge planning process, led by the attending physician.  Recommendations may be updated based on patient status, additional functional criteria and insurance authorization.  Follow Up Recommendations Home health PT      Assistance Recommended at Discharge Intermittent Supervision/Assistance  Patient can return home with the following       Equipment Recommendations None recommended by PT  Recommendations for Other Services       Functional Status Assessment Patient has had a recent decline in their functional  status and demonstrates the ability to make significant improvements in function in a reasonable and predictable amount of time.     Precautions / Restrictions Precautions Precautions: Fall Restrictions Weight Bearing Restrictions: No      Mobility  Bed Mobility Overal bed mobility: Modified Independent             General bed mobility comments: supine>sit with HOB elevated, bed rails PRN    Transfers                        Ambulation/Gait                  Stairs            Wheelchair Mobility    Modified Rankin (Stroke Patients Only)       Balance   Sitting-balance support: Feet supported Sitting balance-Leahy Scale: Normal Sitting balance - Comments: able to doff socks sitting EOB without assistance/LOB                                     Pertinent Vitals/Pain Pain Assessment Pain Assessment: No/denies pain    Home Living Family/patient expects to be discharged to:: Private residence Living Arrangements: Alone Available Help at Discharge: Family;Available PRN/intermittently Type of Home: House Home Access: Stairs to enter Entrance Stairs-Rails: None Entrance Stairs-Number of Steps: 2 Alternate Level Stairs-Number of Steps: 2 steps without rails   Home Equipment: None      Prior Function Prior Level of Function : Driving;Independent/Modified  Independent             Mobility Comments: denies falls       Hand Dominance        Extremity/Trunk Assessment        Lower Extremity Assessment Lower Extremity Assessment:  (not able to assess 2/2 pt's decreased willingness)       Communication   Communication: No difficulties  Cognition Arousal/Alertness: Awake/alert Behavior During Therapy: WFL for tasks assessed/performed (irritable) Overall Cognitive Status: Within Functional Limits for tasks assessed                                          General Comments      Exercises      Assessment/Plan    PT Assessment Patient needs continued PT services  PT Problem List Decreased strength;Decreased activity tolerance;Decreased balance;Decreased mobility;Cardiopulmonary status limiting activity       PT Treatment Interventions Therapeutic exercise;DME instruction;Gait training;Balance training;Stair training;Neuromuscular re-education;Functional mobility training;Therapeutic activities;Patient/family education    PT Goals (Current goals can be found in the Care Plan section)  Acute Rehab PT Goals Patient Stated Goal: "I'm going home with or without you coming in here" PT Goal Formulation: With patient Time For Goal Achievement: 01/10/22 Potential to Achieve Goals: Good    Frequency Min 2X/week     Co-evaluation               AM-PAC PT "6 Clicks" Mobility  Outcome Measure Help needed turning from your back to your side while in a flat bed without using bedrails?: None Help needed moving from lying on your back to sitting on the side of a flat bed without using bedrails?: None Help needed moving to and from a bed to a chair (including a wheelchair)?: None Help needed standing up from a chair using your arms (e.g., wheelchair or bedside chair)?: None Help needed to walk in hospital room?: A Little Help needed climbing 3-5 steps with a railing? : A Little 6 Click Score: 22    End of Session Equipment Utilized During Treatment: Oxygen Activity Tolerance:  (limited 2/2 decreased willingness to participate, pt also endorsing dizziness) Patient left: with bed alarm set;with call bell/phone within reach (sitting EOB)   PT Visit Diagnosis: Dizziness and giddiness (R42);Other abnormalities of gait and mobility (R26.89)    Time: 1610-9604 PT Time Calculation (min) (ACUTE ONLY): 11 min   Charges:   PT Evaluation $PT Eval Moderate Complexity: 1 Mod          Aleda Grana, PT, DPT 12/27/21, 9:13 AM   Sandi Mariscal 12/27/2021, 9:11 AM

## 2021-12-28 DIAGNOSIS — R778 Other specified abnormalities of plasma proteins: Secondary | ICD-10-CM

## 2021-12-28 LAB — BASIC METABOLIC PANEL
Anion gap: 8 (ref 5–15)
BUN: 33 mg/dL — ABNORMAL HIGH (ref 8–23)
CO2: 24 mmol/L (ref 22–32)
Calcium: 9.4 mg/dL (ref 8.9–10.3)
Chloride: 104 mmol/L (ref 98–111)
Creatinine, Ser: 1.06 mg/dL (ref 0.61–1.24)
GFR, Estimated: >60 mL/min (ref >60)
Glucose, Bld: 277 mg/dL — ABNORMAL HIGH (ref 70–99)
Potassium: 3.8 mmol/L (ref 3.5–5.1)
Sodium: 136 mmol/L (ref 135–145)

## 2021-12-28 LAB — GLUCOSE, CAPILLARY
Glucose-Capillary: 264 mg/dL — ABNORMAL HIGH (ref 70–99)
Glucose-Capillary: 164 mg/dL — ABNORMAL HIGH (ref 70–99)
Glucose-Capillary: 215 mg/dL — ABNORMAL HIGH (ref 70–99)
Glucose-Capillary: 409 mg/dL — ABNORMAL HIGH (ref 70–99)
Glucose-Capillary: 348 mg/dL — ABNORMAL HIGH (ref 70–99)
Glucose-Capillary: 251 mg/dL — ABNORMAL HIGH (ref 70–99)

## 2021-12-28 MED ORDER — FUROSEMIDE 40 MG PO TABS
40.0000 mg | ORAL_TABLET | Freq: Every day | ORAL | Status: DC
  Administered 2021-12-28 – 2021-12-29 (×2): 40 mg via ORAL
  Filled 2021-12-28 (×2): qty 1

## 2021-12-28 MED ORDER — INSULIN ASPART 100 UNIT/ML IJ SOLN
4.0000 [IU] | Freq: Three times a day (TID) | INTRAMUSCULAR | Status: DC
Start: 2021-12-28 — End: 2021-12-29
  Administered 2021-12-28 – 2021-12-29 (×4): 4 [IU] via SUBCUTANEOUS
  Filled 2021-12-28: qty 1

## 2021-12-28 MED ORDER — SACUBITRIL-VALSARTAN 24-26 MG PO TABS
1.0000 | ORAL_TABLET | Freq: Two times a day (BID) | ORAL | Status: DC
Start: 2021-12-29 — End: 2021-12-28

## 2021-12-28 NOTE — Progress Notes (Signed)
PT Cancellation Note  Patient Details Name: [name]. MRN: [id number] DOB: 28-May-1956   Cancelled Treatment:    Reason Eval/Treat Not Completed: Other (comment).  Chart reviewed and attempted to see pt.  Upon discussion while in room, pt communicating well and then changed tone when asked if he was ready to get up, he stated no, when asked why, he states "because I said so".  Pt left resting in bed.  Will re-attempt at later date as medically appropriate.     Nolon Bussing, PT, DPT 12/28/21, 2:03 PM

## 2021-12-28 NOTE — Progress Notes (Addendum)
    Heart Failure Nurse Navigator Note  Attempted to meet with patient, visitor was at the bedside. Introduced myself, he states he remembers me from previous hospitalizations.  The Patient was in the process of eating a sausage biscuit, he had two more on his over-the-bed table.  Noted on the table were several containers of pudding and packages of  cookies.  He  c/o dizziness and SOB, explained that I would return at another time. Nurse was present to check vitals and blood sugar.  Tresa Endo RN [facility]

## 2021-12-29 ENCOUNTER — Ambulatory Visit: Payer: Self-pay | Admitting: Family

## 2021-12-29 LAB — CBC
HCT: 47.7 % (ref 39.0–52.0)
Hemoglobin: 16.3 g/dL (ref 13.0–17.0)
MCH: 29.9 pg (ref 26.0–34.0)
MCHC: 34.2 g/dL (ref 30.0–36.0)
MCV: 87.4 fL (ref 80.0–100.0)
Platelets: 231 10*3/uL (ref 150–400)
RBC: 5.46 MIL/uL (ref 4.22–5.81)
RDW: 14.1 % (ref 11.5–15.5)
WBC: 11.5 10*3/uL — ABNORMAL HIGH (ref 4.0–10.5)
nRBC: 0 % (ref 0.0–0.2)

## 2021-12-29 LAB — BASIC METABOLIC PANEL
Anion gap: 8 (ref 5–15)
BUN: 36 mg/dL — ABNORMAL HIGH (ref 8–23)
CO2: 24 mmol/L (ref 22–32)
Calcium: 9.2 mg/dL (ref 8.9–10.3)
Chloride: 106 mmol/L (ref 98–111)
Creatinine, Ser: 1.14 mg/dL (ref 0.61–1.24)
GFR, Estimated: >60 mL/min (ref >60)
Glucose, Bld: 227 mg/dL — ABNORMAL HIGH (ref 70–99)
Potassium: 3.7 mmol/L (ref 3.5–5.1)
Sodium: 138 mmol/L (ref 135–145)

## 2021-12-29 LAB — GLUCOSE, CAPILLARY
Glucose-Capillary: 333 mg/dL — ABNORMAL HIGH (ref 70–99)
Glucose-Capillary: 225 mg/dL — ABNORMAL HIGH (ref 70–99)
Glucose-Capillary: 193 mg/dL — ABNORMAL HIGH (ref 70–99)
Glucose-Capillary: 320 mg/dL — ABNORMAL HIGH (ref 70–99)

## 2021-12-29 MED ORDER — PREDNISONE 50 MG PO TABS
50.0000 mg | ORAL_TABLET | Freq: Four times a day (QID) | ORAL | Status: AC
  Administered 2021-12-29 – 2021-12-30 (×3): 50 mg via ORAL
  Filled 2021-12-29 (×3): qty 1

## 2021-12-29 MED ORDER — INSULIN GLARGINE-YFGN 100 UNIT/ML ~~LOC~~ SOLN
22.0000 [IU] | Freq: Every day | SUBCUTANEOUS | Status: DC
  Administered 2021-12-29 – 2021-12-30 (×2): 22 [IU] via SUBCUTANEOUS
  Filled 2021-12-29 (×3): qty 0.22

## 2021-12-29 MED ORDER — SODIUM CHLORIDE 0.9% FLUSH: 3.0000 mL | INTRAVENOUS | Status: DC | PRN

## 2021-12-29 MED ORDER — SODIUM CHLORIDE 0.9 % IV SOLN
INTRAVENOUS | Status: DC
Start: 2021-12-30 — End: 2021-12-30

## 2021-12-29 MED ORDER — DIPHENHYDRAMINE HCL 25 MG PO CAPS
50.0000 mg | ORAL_CAPSULE | Freq: Once | ORAL | Status: AC
  Administered 2021-12-30: 50 mg via ORAL
  Filled 2021-12-29: qty 2

## 2021-12-29 MED ORDER — SODIUM CHLORIDE 0.9 % IV SOLN: 250.0000 mL | INTRAVENOUS | Status: DC | PRN

## 2021-12-29 MED ORDER — INSULIN GLARGINE-YFGN 100 UNIT/ML ~~LOC~~ SOLN
18.0000 [IU] | Freq: Every day | SUBCUTANEOUS | Status: DC
  Filled 2021-12-29: qty 0.18

## 2021-12-29 MED ORDER — ASPIRIN 81 MG PO CHEW
81.0000 mg | CHEWABLE_TABLET | ORAL | Status: AC
  Administered 2021-12-30: 81 mg via ORAL

## 2021-12-29 MED ORDER — INSULIN ASPART 100 UNIT/ML IJ SOLN
6.0000 [IU] | Freq: Three times a day (TID) | INTRAMUSCULAR | Status: DC
  Administered 2021-12-30 – 2021-12-31 (×4): 6 [IU] via SUBCUTANEOUS
  Filled 2021-12-29 (×5): qty 1

## 2021-12-29 MED ORDER — SODIUM CHLORIDE 0.9% FLUSH
3.0000 mL | Freq: Two times a day (BID) | INTRAVENOUS | Status: DC
  Administered 2021-12-29 (×2): 3 mL via INTRAVENOUS

## 2021-12-29 NOTE — Hospital Course (Addendum)
66 year old man with history of chronic systolic congestive heart failure, cardiomyopathy, hypertension, type 2 diabetes mellitus, CAD.  He presented to the hospital on 6/21 with chest pain and shortness of breath.  Stress test was done and showed a fixed defect in the inferior inferolateral and apical region with a low EF.  This was a high risk and in the setting of cardiomyopathy.  Echocardiogram shows an EF of 30 to 35% with global hypokinesis.  The patient was diuresed with IV Lasix.  On 12/26/2021 the patient complained of unsteady gait.  I ended up doing an MRI of the brain which was positive for a stroke in the left frontal area.  Patient will be on aspirin for 20 days and the patient was already on Plavix.  Cardiology took patient for cardiac cath on 6/28 with vessels appearing similar to catheterization done in 2021 including 70% stenosis of OM.  Pulmonary consulted for ongoing shortness of breath.

## 2021-12-29 NOTE — H&P (View-Only) (Signed)
Progress Note  Patient Name: [name]. Date of Encounter: 12/29/2021  [facility] Cardiologist: Lorine Bears, MD   Subjective   Patient evaluated on a.m. rounds.  Continues to deny chest pain.  Endorses continued shortness of breath and dyspnea on exertion.  Patient continues to complain of just feeling weak.  Inpatient Medications    Scheduled Meds:  [START ON 12/30/2021] aspirin  81 mg Oral Pre-Cath   aspirin EC  81 mg Oral Daily   carvedilol  3.125 mg Oral BID WC   clopidogrel  75 mg Oral Daily   enoxaparin (LOVENOX) injection  0.5 mg/kg Subcutaneous Q24H   ezetimibe  10 mg Oral Daily   furosemide  40 mg Oral Daily   insulin aspart  0-15 Units Subcutaneous TID WC   insulin aspart  0-5 Units Subcutaneous QHS   insulin aspart  4 Units Subcutaneous TID WC   insulin glargine-yfgn  18 Units Subcutaneous QHS   isosorbide mononitrate  15 mg Oral Daily   mometasone-formoterol  2 puff Inhalation BID   sodium chloride flush  3 mL Intravenous Q12H   spironolactone  12.5 mg Oral Daily   Continuous Infusions:  sodium chloride     [START ON 12/30/2021] sodium chloride     PRN Meds: sodium chloride, acetaminophen **OR** acetaminophen, albuterol, nitroGLYCERIN, ondansetron **OR** ondansetron (ZOFRAN) IV, polyethylene glycol, sodium chloride flush   Vital Signs    Vitals:   12/28/21 2050 12/28/21 2359 12/29/21 0500 12/29/21 0725  BP: (!) 143/70 102/69  129/90  Pulse: 75 79  74  Resp:  18  17  Temp: 98 F (36.7 C) 97.7 F (36.5 C)  98 F (36.7 C)  TempSrc: Oral Oral  Oral  SpO2: (!) 87% 92%  96%  Weight:   98.2 kg   Height:        Intake/Output Summary (Last 24 hours) at 12/29/2021 1212 Last data filed at 12/29/2021 0725 Gross per 24 hour  Intake --  Output 0 ml  Net 0 ml      12/29/2021    5:00 AM 12/27/2021    6:07 AM 12/26/2021    4:32 AM  Last 3 Weights  Weight (lbs) 216 lb 7.9 oz 207 lb 10.8 oz 208 lb 15.9 oz  Weight (kg) 98.2 kg 94.2 kg 94.8 kg       Telemetry    Sinus rhythm rate in the 70s 80 unifocal PVCs noted- Personally Reviewed  ECG    No new tracings- Personally Reviewed  Physical Exam   GEN: No acute distress.   Neck: No JVD Cardiac: RRR, no murmurs, rubs, or gallops.  Respiratory: Clear to auscultation bilaterally.  Respirations are unlabored on room air GI: Soft, nontender, non-distended, obese, bowel sounds present in all 4 quadrants MS: No edema; No deformity. Neuro:  Nonfocal  Psych: Normal affect   Labs    High Sensitivity Troponin:   Recent Labs  Lab 12/23/21 0957 12/23/21 1235 12/23/21 1600 12/23/21 2004  TROPONINIHS 30* 21* 22* 32*     Chemistry Recent Labs  Lab 12/23/21 0957 12/24/21 0506 12/27/21 0621 12/28/21 0619 12/29/21 0616  NA 139   < > 137 136 138  K 3.4*   < > 3.4* 3.8 3.7  CL 105   < > 107 104 106  CO2 25   < > 25 24 24   GLUCOSE 182*   < > 211* 277* 227*  BUN 9   < > 32* 33* 36*  CREATININE 0.82   < >  0.90 1.06 1.14  CALCIUM 9.0   < > 8.9 9.4 9.2  PROT 7.6  --   --   --   --   ALBUMIN 4.0  --   --   --   --   AST 15  --   --   --   --   ALT 14  --   --   --   --   ALKPHOS 75  --   --   --   --   BILITOT 1.8*  --   --   --   --   GFRNONAA >60   < > >60 >60 >60  ANIONGAP 9   < > 5 8 8    < > = values in this interval not displayed.    Lipids  Recent Labs  Lab 12/24/21 0506  CHOL 161  TRIG 219*  HDL 40*  LDLCALC 77  CHOLHDL 4.0    Hematology Recent Labs  Lab 12/25/21 0629 12/27/21 0621 12/29/21 0616  WBC 12.3* 11.1* 11.5*  RBC 5.72 5.30 5.46  HGB 16.9 15.7 16.3  HCT 49.5 46.4 47.7  MCV 86.5 87.5 87.4  MCH 29.5 29.6 29.9  MCHC 34.1 33.8 34.2  RDW 14.5 14.3 14.1  PLT 239 232 231   Thyroid No results for input(s): "TSH", "FREET4" in the last 168 hours.  BNP Recent Labs  Lab 12/23/21 0957  BNP 585.1*    DDimer  Recent Labs  Lab 12/23/21 1600  DDIMER 0.67*     Radiology    No results found.  Cardiac Studies   Myoview Lexiscan completed on  12/24/2021 Pharmacological myocardial perfusion imaging study with no significant  ischemia Fixed defect in the inferior, inferolateral and apical region Inferior and inferolateral wall and apical wall hypokinesis , EF estimated at 14% No EKG changes concerning for ischemia at peak stress or in recovery. CT attenuation correction images with three-vessel coronary calcification High risk scan in the setting of cardiomyopathy   Echocardiogram completed on 12/24/2021 1. Left ventricular ejection fraction, by estimation, is 30 to 35%. The  left ventricle has moderately decreased function. The left ventricle  demonstrates global hypokinesis. Left ventricular diastolic parameters are  consistent with Grade I diastolic  dysfunction (impaired relaxation).   2. Right ventricular systolic function is normal. The right ventricular  size is normal. Tricuspid regurgitation signal is inadequate for assessing  PA pressure.   3. The mitral valve is normal in structure. Mild mitral valve  regurgitation. No evidence of mitral stenosis.   4. The aortic valve is normal in structure. Aortic valve regurgitation is  not visualized. Aortic valve sclerosis is present, with no evidence of  aortic valve stenosis.   Patient Profile     66 y.o. male with a history of CAD/PCI, CTO RCA, ischemic cardiomyopathy with a EF 30 to 35%, former smoker x20+ pack years, medication noncompliance with evaluation of chest pain and worsening shortness of breath.  Assessment & Plan    CAD status post multiple PCI's, most recent was in 2021 -Currently chest pain-free on exam -Left heart cath in 2021 with stent to the left circumflex -High-sensitivity troponin trended 30>21 -Continue Imdur, Coreg, aspirin, and Plavix -Echocardiogram revealed LVEF 30 to 35%, G1 DD, mild MR -MPS showed no significant ischemia, fixed defect in the inferior, inferior lateral, and apical region, high risk scan  -With continued symptoms of increasing  dyspnea on exertion unknown etiology he will undergo right and left heart catheterization today with Dr.  End -The risk of bleeding, infection, myocardial infarction, stroke, death, and alternatives have been discussed with the patient.  And he has agreed to proceed with the procedure  Shortness of breath/dyspnea on exertion        Acute on chronic systolic congestive heart failure        History of ischemic cardiomyopathy with EF 30 to 35% -No change in the last echocardiogram -BNP 585 -Continue carvedilol, Jardiance, Entresto, and spironolactone -No accurate I's and O's or weight documented -Continue oral Lasix -Continue daily weights, strict I&O's, low-sodium foods -Daily BMP while on diuretic therapy -Creatinine 1.14 with a BUN of 36 today  Essential hypertension -Blood pressure 129/90 recheck 111/72 today -Continue Entresto, carvedilol, and spironolactone -Medications were reduced yesterday in light of stroke found on MRI to allow for more permissive hypertension  Hyperlipidemia -LDL 77 -Continue Zetia 10 mg daily -Consider PCSK9i as outpatient     For questions or updates, please contact [facility] Please consult [email or web address] for contact info under        Signed, Jonan Seufert, NP  12/29/2021, 12:12 PM

## 2021-12-30 ENCOUNTER — Encounter: Payer: Self-pay | Admitting: Internal Medicine

## 2021-12-30 ENCOUNTER — Encounter
Admission: EM | Disposition: A | Discharge status: Home / Self Care | Payer: Self-pay | Source: Home / Self Care | Attending: Internal Medicine

## 2021-12-30 ENCOUNTER — Inpatient Hospital Stay: Payer: Self-pay

## 2021-12-30 DIAGNOSIS — R778 Other specified abnormalities of plasma proteins: Secondary | ICD-10-CM | POA: Insufficient documentation

## 2021-12-30 DIAGNOSIS — E669 Obesity, unspecified: Secondary | ICD-10-CM | POA: Diagnosis present

## 2021-12-30 HISTORY — PX: RIGHT HEART CATH AND CORONARY ANGIOGRAPHY: CATH118264

## 2021-12-30 LAB — BASIC METABOLIC PANEL
Anion gap: 9 (ref 5–15)
BUN: 34 mg/dL — ABNORMAL HIGH (ref 8–23)
CO2: 22 mmol/L (ref 22–32)
Calcium: 9.7 mg/dL (ref 8.9–10.3)
Chloride: 105 mmol/L (ref 98–111)
Creatinine, Ser: 1.09 mg/dL (ref 0.61–1.24)
GFR, Estimated: >60 mL/min (ref >60)
Glucose, Bld: 336 mg/dL — ABNORMAL HIGH (ref 70–99)
Potassium: 4.8 mmol/L (ref 3.5–5.1)
Sodium: 136 mmol/L (ref 135–145)

## 2021-12-30 LAB — GLUCOSE, CAPILLARY
Glucose-Capillary: 322 mg/dL — ABNORMAL HIGH (ref 70–99)
Glucose-Capillary: 279 mg/dL — ABNORMAL HIGH (ref 70–99)
Glucose-Capillary: 371 mg/dL — ABNORMAL HIGH (ref 70–99)
Glucose-Capillary: 304 mg/dL — ABNORMAL HIGH (ref 70–99)

## 2021-12-30 LAB — D-DIMER, QUANTITATIVE: D-Dimer, Quant: 0.35 ug/mL-FEU (ref 0.00–0.50)

## 2021-12-30 SURGERY — RIGHT HEART CATH AND CORONARY ANGIOGRAPHY
Anesthesia: Moderate Sedation

## 2021-12-30 MED ORDER — FENTANYL CITRATE (PF) 100 MCG/2ML IJ SOLN
INTRAMUSCULAR | Status: DC | PRN
  Administered 2021-12-30 (×3): 25 ug via INTRAVENOUS

## 2021-12-30 MED ORDER — HEPARIN (PORCINE) IN NACL 1000-0.9 UT/500ML-% IV SOLN
INTRAVENOUS | Status: DC | PRN
  Administered 2021-12-30: 1000 mL

## 2021-12-30 MED ORDER — FENTANYL CITRATE (PF) 100 MCG/2ML IJ SOLN
INTRAMUSCULAR | Status: AC
  Filled 2021-12-30: qty 2

## 2021-12-30 MED ORDER — HYDRALAZINE HCL 20 MG/ML IJ SOLN: 10.0000 mg | INTRAMUSCULAR | Status: AC | PRN

## 2021-12-30 MED ORDER — HEPARIN SODIUM (PORCINE) 1000 UNIT/ML IJ SOLN
INTRAMUSCULAR | Status: DC | PRN
  Administered 2021-12-30: 5000 [IU] via INTRAVENOUS

## 2021-12-30 MED ORDER — LIDOCAINE HCL (PF) 1 % IJ SOLN
INTRAMUSCULAR | Status: DC | PRN
  Administered 2021-12-30 (×2): 2 mL

## 2021-12-30 MED ORDER — IOHEXOL 300 MG/ML  SOLN
INTRAMUSCULAR | Status: DC | PRN
  Administered 2021-12-30: 37 mL

## 2021-12-30 MED ORDER — MIDAZOLAM HCL 2 MG/2ML IJ SOLN
INTRAMUSCULAR | Status: DC | PRN
  Administered 2021-12-30 (×2): 1 mg via INTRAVENOUS

## 2021-12-30 MED ORDER — SODIUM CHLORIDE 0.9 % IV SOLN: 250.0000 mL | INTRAVENOUS | Status: DC | PRN

## 2021-12-30 MED ORDER — ASPIRIN 81 MG PO CHEW
CHEWABLE_TABLET | ORAL | Status: AC
  Filled 2021-12-30: qty 1

## 2021-12-30 MED ORDER — VERAPAMIL HCL 2.5 MG/ML IV SOLN
INTRAVENOUS | Status: AC
  Filled 2021-12-30: qty 2

## 2021-12-30 MED ORDER — MIDAZOLAM HCL 2 MG/2ML IJ SOLN
INTRAMUSCULAR | Status: AC
  Filled 2021-12-30: qty 2

## 2021-12-30 MED ORDER — BREZTRI AEROSPHERE 160-9-4.8 MCG/ACT IN AERO: 2.0000 | INHALATION_SPRAY | Freq: Two times a day (BID) | RESPIRATORY_TRACT | 1 refills | Status: DC

## 2021-12-30 MED ORDER — HEPARIN (PORCINE) IN NACL 1000-0.9 UT/500ML-% IV SOLN
INTRAVENOUS | Status: AC
  Filled 2021-12-30: qty 1000

## 2021-12-30 MED ORDER — VERAPAMIL HCL 2.5 MG/ML IV SOLN
INTRAVENOUS | Status: DC | PRN
  Administered 2021-12-30: 2.5 mg via INTRA_ARTERIAL

## 2021-12-30 MED ORDER — RANOLAZINE ER 500 MG PO TB12
500.0000 mg | ORAL_TABLET | Freq: Two times a day (BID) | ORAL | Status: DC
  Administered 2021-12-30 – 2021-12-31 (×3): 500 mg via ORAL
  Filled 2021-12-30 (×3): qty 1

## 2021-12-30 MED ORDER — FUROSEMIDE 40 MG PO TABS
40.0000 mg | ORAL_TABLET | Freq: Every day | ORAL | Status: DC
  Administered 2021-12-31: 40 mg via ORAL
  Filled 2021-12-30: qty 1

## 2021-12-30 MED ORDER — HEPARIN SODIUM (PORCINE) 1000 UNIT/ML IJ SOLN
INTRAMUSCULAR | Status: AC
  Filled 2021-12-30: qty 10

## 2021-12-30 MED ORDER — ENOXAPARIN SODIUM 60 MG/0.6ML IJ SOSY
0.5000 mg/kg | PREFILLED_SYRINGE | INTRAMUSCULAR | Status: DC
Start: 2021-12-31 — End: 2022-01-01
  Administered 2021-12-31: 47.5 mg via SUBCUTANEOUS
  Filled 2021-12-30: qty 0.6

## 2021-12-30 MED ORDER — SODIUM CHLORIDE 0.9% FLUSH
3.0000 mL | Freq: Two times a day (BID) | INTRAVENOUS | Status: DC
Start: 2021-12-30 — End: 2022-01-01
  Administered 2021-12-30 – 2021-12-31 (×3): 3 mL via INTRAVENOUS

## 2021-12-30 MED ORDER — LIDOCAINE HCL 1 % IJ SOLN
INTRAMUSCULAR | Status: AC
  Filled 2021-12-30: qty 20

## 2021-12-30 MED ORDER — SODIUM CHLORIDE 0.9% FLUSH: 3.0000 mL | INTRAVENOUS | Status: DC | PRN

## 2021-12-30 SURGICAL SUPPLY — 13 items
BAND CMPR LRG ZPHR (HEMOSTASIS) ×1
BAND ZEPHYR COMPRESS 30 LONG (HEMOSTASIS) ×1 IMPLANT
CATH 5F 110X4 TIG (CATHETERS) ×1 IMPLANT
CATH BALLN WEDGE 5F 110CM (CATHETERS) ×1 IMPLANT
DRAPE BRACHIAL (DRAPES) ×2 IMPLANT
GLIDESHEATH SLEND SS 6F .021 (SHEATH) ×1 IMPLANT
GUIDEWIRE INQWIRE 1.5J.035X260 (WIRE) IMPLANT
INQWIRE 1.5J .035X260CM (WIRE) ×2
PACK CARDIAC CATH (CUSTOM PROCEDURE TRAY) ×2 IMPLANT
PROTECTION STATION PRESSURIZED (MISCELLANEOUS) ×2
SET ATX SIMPLICITY (MISCELLANEOUS) ×1 IMPLANT
SHEATH GLIDE SLENDER 4/5FR (SHEATH) ×1 IMPLANT
STATION PROTECTION PRESSURIZED (MISCELLANEOUS) IMPLANT

## 2021-12-30 NOTE — Assessment & Plan Note (Signed)
Meets criteria BMI greater than 30 

## 2021-12-30 NOTE — Consult Note (Signed)
[facility] Pulmonary Medicine Consultation      Date: 12/30/2021,   MRN# [id number] [name]. [birth year]     CHIEF COMPLAINT:  SOB     HISTORY OF PRESENT ILLNESS   66 year old man with history of chronic systolic congestive heart failure, cardiomyopathy, hypertension, type 2 diabetes mellitus, CAD.    He presented to the hospital on June 21 with chest pain and shortness of breath Fixed defect noted on stress test done inferolateral and apical region with a EF of 25% Echocardiogram shows EF of 30 to 35% with global hypokinesis Cardiology evaluated patient started on Lasix therapy During admission he developed unsteady gait MRI of the brain shows evidence of stroke left frontal area Currently patient is alert and awake following commands Cardiac catheter today shows no significant changes from previous catheter 2021 Pulmonary consulted for ongoing shortness of breath  At this time is multifactorial including ischemic cardiomyopathy with a EF of 30% morbid obesity in the setting of stroke deconditioned state Less likely PE less likely COPD exacerbation at this time and less likely pneumonia  Patient denies fevers chills nausea vomiting diarrhea Patient asking to go home  At this time patient requires minimal oxygen therapy I have explained to him he may need oxygen therapy upon discharge-follow-up with pulmonary as outpatient  No indication for bronchoscopy steroids or antibiotics at this time Patient is a former smoker and may have COPD We will start inhaler therapy   PAST MEDICAL HISTORY   Past Medical History:  Diagnosis Date   Anginal pain (HCC)    Arthritis    CAD (coronary artery disease)    a. s/p MI with LAD and Diag stenting @ [name];  b. 06/2015 Cath: LAD 17m/d ISR, 100 RCA (ISR) w/ L->R collats, otw mod nonobs dzs-->Med Rx; c. 08/2019 Cath: LM nl, LAD 10p/m ISR, D1 20, D2 100, RI min irregs, LCX 34m/d, OM1 100, OM2 50, RCA 100p, 70d. RPDA fills via  collats from LAD. EF 25-35%-->Med Rx; d. 10/2019 NSTEMI/Cath: LCX now 100 (2.75x15 Resolute Onyx DES), otw stable compared to 08/2019.   Chronic combined systolic and diastolic CHF (congestive heart failure) (HCC)    a. 06/2015 Echo: EF 20-25%, Gr3 DD; b. 10/2019 Echo: EF 25-30%, glob HK, sev inf/infapical HK. Mod dil LA.   Dyspnea    Essential hypertension    Headache 12/23/2021   Hyperlipidemia    Hypokalemia    a. 06/2015 in setting of diuresis.   Ischemic cardiomyopathy    a. 2011 EF 45% ([name]);  b. 06/2015 Echo: EF 20-25%; c. 04/2019 Echo: EF 40-45%; d. 08/2019 LV gram: EF 25-35%; e. 10/2019 Echo: EF 25-30%.   PVC's (premature ventricular contractions)    a. 09/2019 Zio (3 days): Avg hr 70, 4 runs NSVT, 5 runs SVT, rare PACs, frequent PVCs w/ 11.8% burden.   Sleep apnea    Stroke/Right temporal lobe infarction [facility])    a. 10/2019 MRI brain: 1cm acute ischemic nonhemorrhagic R temporal lobe infarct. Age-related cerebral atrophy w/ moderate chronic small vessel ischemic dzs.   Type 2 diabetes mellitus with hyperglycemia (HCC) 10/08/2019     SURGICAL HISTORY   Past Surgical History:  Procedure Laterality Date   BACK SURGERY  04/2019   CARDIAC CATHETERIZATION N/A 06/10/2015   Procedure: Left Heart Cath;  Surgeon: Iran Ouch, MD;  Location: [facility] INVASIVE CV LAB;  Service: Cardiovascular;  Laterality: N/A;   CORONARY STENT INTERVENTION N/A 10/08/2019   Procedure: CORONARY STENT INTERVENTION;  Surgeon:  Iran Ouch, MD;  Location: [facility] INVASIVE CV LAB;  Service: Cardiovascular;  Laterality: N/A;   CORONARY STENT PLACEMENT     LEFT HEART CATH AND CORONARY ANGIOGRAPHY N/A 10/08/2019   Procedure: LEFT HEART CATH AND CORONARY ANGIOGRAPHY poss pci;  Surgeon: Iran Ouch, MD;  Location: [facility] INVASIVE CV LAB;  Service: Cardiovascular;  Laterality: N/A;   RIGHT HEART CATH AND CORONARY ANGIOGRAPHY N/A 12/30/2021   Procedure: RIGHT HEART CATH AND CORONARY ANGIOGRAPHY;  Surgeon: Yvonne Kendall, MD;  Location: [facility] INVASIVE CV LAB;  Service: Cardiovascular;  Laterality: N/A;   RIGHT/LEFT HEART CATH AND CORONARY ANGIOGRAPHY N/A 08/20/2019   Procedure: RIGHT/LEFT HEART CATH AND CORONARY ANGIOGRAPHY;  Surgeon: Iran Ouch, MD;  Location: [facility] INVASIVE CV LAB;  Service: Cardiovascular;  Laterality: N/A;     FAMILY HISTORY   Family History  Problem Relation Age of Onset   Coronary artery disease Mother    Diabetes Mother    Coronary artery disease Father      SOCIAL HISTORY   Social History   Tobacco Use   Smoking status: Former    Types: Cigarettes    Quit date: 06/10/2011    Years since quitting: 10.5   Smokeless tobacco: Never  Substance Use Topics   Alcohol use: No    Alcohol/week: 0.0 standard drinks of alcohol   Drug use: No     MEDICATIONS    Home Medication:    Current Medication:  Current Facility-Administered Medications:    0.9 %  sodium chloride infusion, 250 mL, Intravenous, PRN, End, Christopher, MD   acetaminophen (TYLENOL) tablet 650 mg, 650 mg, Oral, Q6H PRN, 650 mg at 12/26/21 0806 **OR** acetaminophen (TYLENOL) suppository 650 mg, 650 mg, Rectal, Q6H PRN, End, Christopher, MD   albuterol (PROVENTIL) (2.5 MG/3ML) 0.083% nebulizer solution 3 mL, 3 mL, Inhalation, Q6H PRN, End, Christopher, MD, 3 mL at 12/29/21 1222   aspirin EC tablet 81 mg, 81 mg, Oral, Daily, End, Christopher, MD, 81 mg at 12/29/21 0803   carvedilol (COREG) tablet 3.125 mg, 3.125 mg, Oral, BID WC, End, Christopher, MD, 3.125 mg at 12/29/21 1822   clopidogrel (PLAVIX) tablet 75 mg, 75 mg, Oral, Daily, End, Christopher, MD, 75 mg at 12/30/21 1317   [START ON 12/31/2021] enoxaparin (LOVENOX) injection 47.5 mg, 0.5 mg/kg, Subcutaneous, Q24H, End, Christopher, MD   ezetimibe (ZETIA) tablet 10 mg, 10 mg, Oral, Daily, End, Christopher, MD, 10 mg at 12/30/21 1316   insulin aspart (novoLOG) injection 0-15 Units, 0-15 Units, Subcutaneous, TID WC, End, Christopher, MD, 8  Units at 12/30/21 1315   insulin aspart (novoLOG) injection 0-5 Units, 0-5 Units, Subcutaneous, QHS, End, Christopher, MD, 3 Units at 12/29/21 2142   insulin aspart (novoLOG) injection 6 Units, 6 Units, Subcutaneous, TID WC, End, Christopher, MD, 6 Units at 12/30/21 1316   insulin glargine-yfgn (SEMGLEE) injection 22 Units, 22 Units, Subcutaneous, QHS, End, Christopher, MD, 22 Units at 12/29/21 2142   isosorbide mononitrate (IMDUR) 24 hr tablet 15 mg, 15 mg, Oral, Daily, End, Christopher, MD, 15 mg at 12/30/21 1317   mometasone-formoterol (DULERA) 200-5 MCG/ACT inhaler 2 puff, 2 puff, Inhalation, BID, End, Christopher, MD, 2 puff at 12/30/21 1318   nitroGLYCERIN (NITROSTAT) SL tablet 0.4 mg, 0.4 mg, Sublingual, Q5 min PRN, End, Christopher, MD   ondansetron (ZOFRAN) tablet 4 mg, 4 mg, Oral, Q6H PRN, 4 mg at 12/25/21 1325 **OR** ondansetron (ZOFRAN) injection 4 mg, 4 mg, Intravenous, Q6H PRN, End, Cristal Deer, MD   polyethylene glycol (MIRALAX /  GLYCOLAX) packet 17 g, 17 g, Oral, Daily PRN, End, Harrell Gave, MD   ranolazine (RANEXA) 12 hr tablet 500 mg, 500 mg, Oral, BID, End, Harrell Gave, MD, 500 mg at 12/30/21 1318   sodium chloride flush (NS) 0.9 % injection 3 mL, 3 mL, Intravenous, Q12H, End, Christopher, MD, 3 mL at 12/30/21 1320   sodium chloride flush (NS) 0.9 % injection 3 mL, 3 mL, Intravenous, PRN, End, Harrell Gave, MD   spironolactone (ALDACTONE) tablet 12.5 mg, 12.5 mg, Oral, Daily, End, Christopher, MD, 12.5 mg at 12/30/21 1317    ALLERGIES   Contrast media [iodinated contrast media], Iohexol, Jardiance [empagliflozin], Atorvastatin, Hydrocodone, Morphine and related, Penicillins, and Rosuvastatin     REVIEW OF SYSTEMS    Review of Systems:  Gen:  Denies  fever, sweats, chills weigh loss  HEENT: Denies blurred vision, double vision, ear pain, eye pain, hearing loss, nose bleeds, sore throat Cardiac:  No dizziness, chest pain or heaviness, chest tightness,edema Resp:    Denies cough or sputum porduction, +shortness of breath,wheezing, -hemoptysis,  Gi: Denies swallowing difficulty, stomach pain, nausea or vomiting, diarrhea, constipation, bowel incontinence Gu:  Denies bladder incontinence, burning urine Ext:   Denies Joint pain, stiffness or swelling Skin: Denies  skin rash, easy bruising or bleeding or hives Endoc:  Denies polyuria, polydipsia , polyphagia or weight change Psych:   Denies depression, insomnia or hallucinations   Other:  All other systems negative   VS: BP (!) 149/90   Pulse 83   Temp 97.7 F (36.5 C) (Oral)   Resp 16   Ht 5\' 6"  (1.676 m)   Wt 94.5 kg   SpO2 95%   BMI 33.63 kg/m      PHYSICAL EXAM  General Appearance: No distress  EYES PERRLA, EOM intact.   NECK Supple, No JVD Pulmonary: normal breath sounds, No wheezing.  CardiovascularNormal S1,S2.  No m/r/g.   Abdomen: Benign, Soft, non-tender. Skin:   warm, no rashes, no ecchymosis  Extremities: normal, no cyanosis, clubbing. Neuro:without focal findings,  speech normal  PSYCHIATRIC: Mood, affect within normal limits.   ALL OTHER ROS ARE NEGATIVE      IMAGING    CARDIAC CATHETERIZATION  Result Date: 12/30/2021 Conclusions: Severe multivessel coronary artery disease, overall relatively similar to most recent cath in 2021.  Mid LCx stent placed at that time demonstrates mild in-stent restenosis in the proximal segment.  Eccentric ostial OM2 lesion appears similar. Mildly elevated left heart and pulmonary artery pressures. Mildly reduced Fick cardiac output/index. Recommendations: Continue indefinite DAPT with aspirin and clopidogrel. Continue gentle diuresis; I will add back furosemide 40 mg daily.  Escalate GDMT for HFrEF, as blood pressure and renal function allow. Add ranolazine 500 mg BID for antianginal therapy.  EKG to be obtained tomorrow AM to reassess QT interval. Consider pulmonary consultation, as degree of dyspnea seems to be out of proportion to heart  failure/coronary artery disease. If the patient has refractory symptoms in spite of aforementioned interventions, PCI to OM2 may need to be considered.  If possible, this should be deferred for at least 2 weeks from the time of recent brain MRI demonstrating acute stroke in order to minimize risk for periprocedural intracranial hemorrhage. Aggressive secondary prevention of coronary artery disease. Nelva Bush, MD [facility]  MR BRAIN W WO CONTRAST  Result Date: 12/26/2021 CLINICAL DATA:  Acute neuro deficit.  Rule out stroke EXAM: MRI HEAD WITHOUT AND WITH CONTRAST MRA HEAD WITHOUT CONTRAST MRA NECK WITHOUT AND WITH CONTRAST TECHNIQUE: Multiplanar, multiecho  pulse sequences of the brain and surrounding structures were obtained without and with intravenous contrast. Angiographic images of the Circle of Willis were obtained using MRA technique without intravenous contrast. Angiographic images of the neck were obtained using MRA technique without and with intravenous contrast. Carotid stenosis measurements (when applicable) are obtained utilizing NASCET criteria, using the distal internal carotid diameter as the denominator. CONTRAST:  49mL GADAVIST GADOBUTROL 1 MMOL/ML IV SOLN COMPARISON:  CT head 12/23/2021 FINDINGS: MRI HEAD FINDINGS Brain: Small acute infarct left frontal white matter. No other acute infarct. Moderate chronic microvascular ischemic change in the white matter. Brainstem normal. Small chronic infarcts in the cerebellum bilaterally. Scattered areas of chronic microhemorrhage in the occipital parietal lobes and thalamus bilaterally. Chronic microhemorrhage in the left cerebellum. No mass lesion. Normal enhancement postcontrast infusion. Vascular: Normal arterial flow voids Skull and upper cervical spine: No focal skeletal lesion. Sinuses/Orbits: Mild mucosal edema paranasal sinuses. Negative orbit Other: None MRA HEAD FINDINGS Internal carotid artery widely patent bilaterally. Anterior and  middle cerebral arteries widely patent bilaterally without stenosis. Both vertebral arteries patent to the basilar. Basilar widely patent. Posterior cerebral arteries patent bilaterally. MRA NECK FINDINGS Normal aortic arch and proximal great vessels. Left vertebral artery origin from the arch. Carotid artery widely patent bilaterally. Both vertebral arteries patent to the basilar without stenosis. IMPRESSION: 1. Small area of acute infarction left frontal white matter. Moderate chronic microvascular ischemic change in the white matter. Multiple areas of chronic microhemorrhage in the brain correlate with hypertension history 2. Negative MRA head 3. Negative MRA neck Electronically Signed   By: Franchot Gallo [name]   On: 12/26/2021 14:57   MR ANGIO HEAD WO CONTRAST  Result Date: 12/26/2021 CLINICAL DATA:  Acute neuro deficit.  Rule out stroke EXAM: MRI HEAD WITHOUT AND WITH CONTRAST MRA HEAD WITHOUT CONTRAST MRA NECK WITHOUT AND WITH CONTRAST TECHNIQUE: Multiplanar, multiecho pulse sequences of the brain and surrounding structures were obtained without and with intravenous contrast. Angiographic images of the Circle of Willis were obtained using MRA technique without intravenous contrast. Angiographic images of the neck were obtained using MRA technique without and with intravenous contrast. Carotid stenosis measurements (when applicable) are obtained utilizing NASCET criteria, using the distal internal carotid diameter as the denominator. CONTRAST:  29mL GADAVIST GADOBUTROL 1 MMOL/ML IV SOLN COMPARISON:  CT head 12/23/2021 FINDINGS: MRI HEAD FINDINGS Brain: Small acute infarct left frontal white matter. No other acute infarct. Moderate chronic microvascular ischemic change in the white matter. Brainstem normal. Small chronic infarcts in the cerebellum bilaterally. Scattered areas of chronic microhemorrhage in the occipital parietal lobes and thalamus bilaterally. Chronic microhemorrhage in the left cerebellum.  No mass lesion. Normal enhancement postcontrast infusion. Vascular: Normal arterial flow voids Skull and upper cervical spine: No focal skeletal lesion. Sinuses/Orbits: Mild mucosal edema paranasal sinuses. Negative orbit Other: None MRA HEAD FINDINGS Internal carotid artery widely patent bilaterally. Anterior and middle cerebral arteries widely patent bilaterally without stenosis. Both vertebral arteries patent to the basilar. Basilar widely patent. Posterior cerebral arteries patent bilaterally. MRA NECK FINDINGS Normal aortic arch and proximal great vessels. Left vertebral artery origin from the arch. Carotid artery widely patent bilaterally. Both vertebral arteries patent to the basilar without stenosis. IMPRESSION: 1. Small area of acute infarction left frontal white matter. Moderate chronic microvascular ischemic change in the white matter. Multiple areas of chronic microhemorrhage in the brain correlate with hypertension history 2. Negative MRA head 3. Negative MRA neck Electronically Signed   By: Franchot Gallo [name]  On: 12/26/2021 14:57   MR ANGIO NECK W WO CONTRAST  Result Date: 12/26/2021 CLINICAL DATA:  Acute neuro deficit.  Rule out stroke EXAM: MRI HEAD WITHOUT AND WITH CONTRAST MRA HEAD WITHOUT CONTRAST MRA NECK WITHOUT AND WITH CONTRAST TECHNIQUE: Multiplanar, multiecho pulse sequences of the brain and surrounding structures were obtained without and with intravenous contrast. Angiographic images of the Circle of Willis were obtained using MRA technique without intravenous contrast. Angiographic images of the neck were obtained using MRA technique without and with intravenous contrast. Carotid stenosis measurements (when applicable) are obtained utilizing NASCET criteria, using the distal internal carotid diameter as the denominator. CONTRAST:  78mL GADAVIST GADOBUTROL 1 MMOL/ML IV SOLN COMPARISON:  CT head 12/23/2021 FINDINGS: MRI HEAD FINDINGS Brain: Small acute infarct left frontal white  matter. No other acute infarct. Moderate chronic microvascular ischemic change in the white matter. Brainstem normal. Small chronic infarcts in the cerebellum bilaterally. Scattered areas of chronic microhemorrhage in the occipital parietal lobes and thalamus bilaterally. Chronic microhemorrhage in the left cerebellum. No mass lesion. Normal enhancement postcontrast infusion. Vascular: Normal arterial flow voids Skull and upper cervical spine: No focal skeletal lesion. Sinuses/Orbits: Mild mucosal edema paranasal sinuses. Negative orbit Other: None MRA HEAD FINDINGS Internal carotid artery widely patent bilaterally. Anterior and middle cerebral arteries widely patent bilaterally without stenosis. Both vertebral arteries patent to the basilar. Basilar widely patent. Posterior cerebral arteries patent bilaterally. MRA NECK FINDINGS Normal aortic arch and proximal great vessels. Left vertebral artery origin from the arch. Carotid artery widely patent bilaterally. Both vertebral arteries patent to the basilar without stenosis. IMPRESSION: 1. Small area of acute infarction left frontal white matter. Moderate chronic microvascular ischemic change in the white matter. Multiple areas of chronic microhemorrhage in the brain correlate with hypertension history 2. Negative MRA head 3. Negative MRA neck Electronically Signed   By: Franchot Gallo [name]   On: 12/26/2021 14:57   US Venous Img Lower Bilateral (DVT)  Result Date: 12/26/2021 CLINICAL DATA:  LEFT lower extremity swelling.  Chest pain. EXAM: BILATERAL LOWER EXTREMITY VENOUS DOPPLER ULTRASOUND TECHNIQUE: Gray-scale sonography with graded compression, as well as color Doppler and duplex ultrasound were performed to evaluate the lower extremity deep venous systems from the level of the common femoral vein and including the common femoral, femoral, profunda femoral, popliteal and calf veins including the posterior tibial, peroneal and gastrocnemius veins when visible.  The superficial great saphenous vein was also interrogated. Spectral Doppler was utilized to evaluate flow at rest and with distal augmentation maneuvers in the common femoral, femoral and popliteal veins. COMPARISON:  Chest XR, 12/23/2021. FINDINGS: RIGHT LOWER EXTREMITY VENOUS Normal compressibility of the RIGHT common femoral, superficial femoral, and popliteal veins, as well as the visualized calf veins. Visualized portions of profunda femoral vein and great saphenous vein unremarkable. No filling defects to suggest DVT on grayscale or color Doppler imaging. Doppler waveforms show normal direction of venous flow, normal respiratory plasticity and response to augmentation. OTHER No evidence of superficial thrombophlebitis or abnormal fluid collection. Limitations: none LEFT LOWER EXTREMITY VENOUS Normal compressibility of the LEFT common femoral, superficial femoral, and popliteal veins, as well as the visualized calf veins. Visualized portions of profunda femoral vein and great saphenous vein unremarkable. No filling defects to suggest DVT on grayscale or color Doppler imaging. Doppler waveforms show normal direction of venous flow, normal respiratory plasticity and response to augmentation. OTHER No evidence of superficial thrombophlebitis or abnormal fluid collection. Limitations: none IMPRESSION: No evidence of femoropopliteal DVT within  either lower extremity. Michaelle Birks, MD [facility] Electronically Signed   By: Michaelle Birks [name]   On: 12/26/2021 09:17   NM Myocar Multi W/Spect W/Wall Motion / EF  Result Date: 12/24/2021 Pharmacological myocardial perfusion imaging study with no significant  ischemia Fixed defect in the inferior, inferolateral and apical region Inferior and inferolateral wall and apical wall hypokinesis , EF estimated at 14% No EKG changes concerning for ischemia at peak stress or in recovery. CT attenuation correction images  with three-vessel coronary calcification High risk scan in the setting of cardiomyopathy Signed, Esmond Plants, MD, Ph.D [facility]   ECHOCARDIOGRAM COMPLETE  Result Date: 12/24/2021    ECHOCARDIOGRAM REPORT   Patient Name:   Malakii Harl. Date of Exam: 12/23/2021 Medical Rec #:  [id number]         Height:       66.0 in Accession #:    [id number]        Weight:       217.0 lb Date of Birth:  August 20, 1955          BSA:          2.070 m Patient Age:    60 years          BP:           169/98 mmHg Patient Gender: M                 HR:           68 bpm. Exam Location:  [facility] Procedure: 2D Echo, Cardiac Doppler, Color Doppler and Intracardiac            Opacification Agent Indications:     R94.31 Abnormal EKG  History:         Patient has prior history of Echocardiogram examinations, most                  recent 06/19/2021. Ischemic cardiomyopathy, CAD, Stroke,                  Arrythmias:PVC, Signs/Symptoms:Dyspnea; Risk                  Factors:Hypertension, Dyslipidemia, Diabetes and Sleep Apnea.                  Chronic systolic and diastolic heart failure.  Sonographer:     Cresenciano Lick RDCS Referring Phys:  [id number] AMIN Diagnosing Phys: Ida Rogue MD IMPRESSIONS  1. Left ventricular ejection fraction, by estimation, is 30 to 35%. The left ventricle has moderately decreased function. The left ventricle demonstrates global hypokinesis. Left ventricular diastolic parameters are consistent with Grade I diastolic dysfunction (impaired relaxation).  2. Right ventricular systolic function is normal. The right ventricular size is normal. Tricuspid regurgitation signal is inadequate for assessing PA pressure.  3. The mitral valve is normal in structure. Mild mitral valve regurgitation. No evidence of mitral stenosis.  4. The aortic valve is normal in structure. Aortic valve regurgitation is not visualized. Aortic valve sclerosis is present, with no evidence of aortic valve stenosis. FINDINGS  Left  Ventricle: Left ventricular ejection fraction, by estimation, is 30 to 35%. The left ventricle has moderately decreased function. The left ventricle demonstrates global hypokinesis. Definity contrast agent was given IV to delineate the left ventricular endocardial borders. The left ventricular internal cavity size was normal in size. There is no left ventricular hypertrophy. Left ventricular diastolic parameters are consistent with Grade  I diastolic dysfunction (impaired relaxation). Right Ventricle: The right ventricular size is normal. No increase in right ventricular wall thickness. Right ventricular systolic function is normal. Tricuspid regurgitation signal is inadequate for assessing PA pressure. Left Atrium: Left atrial size was normal in size. Right Atrium: Right atrial size was normal in size. Pericardium: There is no evidence of pericardial effusion. Mitral Valve: The mitral valve is normal in structure. Mild mitral valve regurgitation. No evidence of mitral valve stenosis. Tricuspid Valve: The tricuspid valve is normal in structure. Tricuspid valve regurgitation is not demonstrated. No evidence of tricuspid stenosis. Aortic Valve: The aortic valve is normal in structure. Aortic valve regurgitation is not visualized. Aortic valve sclerosis is present, with no evidence of aortic valve stenosis. Pulmonic Valve: The pulmonic valve was normal in structure. Pulmonic valve regurgitation is not visualized. No evidence of pulmonic stenosis. Aorta: The aortic root is normal in size and structure. Ascending aorta measurements are within normal limits for age when indexed to body surface area. Venous: The pulmonary veins were not well visualized. The inferior vena cava was not well visualized. IAS/Shunts: No atrial level shunt detected by color flow Doppler.  LEFT VENTRICLE PLAX 2D LVIDd:         5.70 cm   Diastology LVIDs:         5.10 cm   LV e' medial:    3.48 cm/s LV PW:         1.00 cm   LV E/e' medial:  10.8 LV  IVS:        1.00 cm   LV e' lateral:   4.46 cm/s LVOT diam:     2.20 cm   LV E/e' lateral: 8.5 LV SV:         35 LV SV Index:   17 LVOT Area:     3.80 cm  RIGHT VENTRICLE RV Basal diam:  4.70 cm RV S prime:     10.52 cm/s TAPSE (M-mode): 2.4 cm LEFT ATRIUM             Index        RIGHT ATRIUM           Index LA diam:        4.50 cm 2.17 cm/m   RA Area:     18.00 cm LA Vol (A2C):   87.1 ml 42.07 ml/m  RA Volume:   53.80 ml  25.99 ml/m LA Vol (A4C):   36.5 ml 17.63 ml/m LA Biplane Vol: 62.5 ml 30.19 ml/m  AORTIC VALVE LVOT Vmax:   48.65 cm/s LVOT Vmean:  34.100 cm/s LVOT VTI:    0.091 m  AORTA Ao Root diam: 4.00 cm Ao Asc diam:  3.70 cm MITRAL VALVE MV Area (PHT): 3.72 cm    SHUNTS MV Decel Time: 204 msec    Systemic VTI:  0.09 m MV E velocity: 37.70 cm/s  Systemic Diam: 2.20 cm MV A velocity: 62.10 cm/s MV E/A ratio:  0.61 Ida Rogue MD Electronically signed by Ida Rogue MD Signature Date/Time: 12/24/2021/1:15:16 PM    Final    CT Head Wo Contrast  Result Date: 12/23/2021 CLINICAL DATA:  Headache EXAM: CT HEAD WITHOUT CONTRAST TECHNIQUE: Contiguous axial images were obtained from the base of the skull through the vertex without intravenous contrast. RADIATION DOSE REDUCTION: This exam was performed according to the departmental dose-optimization program which includes automated exposure control, adjustment of the mA and/or kV according to patient size and/or use of iterative reconstruction technique. COMPARISON:  CT head 06/19/2021 FINDINGS: Brain: No acute intracranial hemorrhage, mass effect, or herniation. No extra-axial fluid collections. No evidence of acute territorial infarct. No hydrocephalus. Moderate cortical volume loss. Extensive patchy hypodensities throughout the periventricular and subcortical white matter, likely secondary to chronic microvascular ischemic changes. Small old infarct in the left posterior periventricular white matter. Vascular: No hyperdense vessel or unexpected  calcification. Skull: Normal. Negative for fracture or focal lesion. Sinuses/Orbits: No acute finding. Chronic moderate mucosal opacification of the left sphenoid sinus. Other: None. IMPRESSION: Chronic changes as described with no acute intracranial process identified. Electronically Signed   By: Ofilia Neas [name]   On: 12/23/2021 11:44   DG Chest 2 View  Result Date: 12/23/2021 CLINICAL DATA:  Chest pain, dizziness, and shortness of breath. EXAM: CHEST - 2 VIEW COMPARISON:  Chest radiograph 06/17/2021 FINDINGS: The cardiomediastinal silhouette is unchanged with upper limits of normal heart size and mild chronic prominence of the central pulmonary arteries. Mild scarring is noted in the right middle lobe. No acute airspace consolidation, edema, pleural effusion, or pneumothorax is identified. No acute osseous abnormality is seen. IMPRESSION: No active cardiopulmonary disease. Electronically Signed   By: Logan Bores [name]   On: 12/23/2021 10:24    Ultrasound lower extremities negative for DVT  ASSESSMENT/PLAN    66 year old white male seen today for shortness of breath likely related to multifactorial diseases including acute on chronic systolic CHF exacerbation with EF of 30% in the setting of acute CVA with probable underlying COPD in the setting of deconditioned state  #1 continue oxygen as prescribed #2 continue cardiology recommendations for Lasix and cardiac meds #3 less likely PE less likely pneumonia less likely COPD exacerbation at this time No indication for steroids no indication for antibiotics at this time #4 probable underlying COPD we will consider adding inhaler therapy  Stop Ruthe Mannan and add Breztri triple inhaler therapy #5 recommend PT OT #6 aspiration precautions #7 work-up for OSA as outpatient   CURRENT MEDICATIONS REVIEWED AT LENGTH WITH PATIENT TODAY   Patient  satisfied with Plan of action and management. All questions answered  Follow up in outpatient  pulmonary clinic in 6 to 8 weeks  Total Time Spent 55 minutes   Maretta Bees Patricia Pesa, [name]  [facility]  Medical Director [place] Director [facility] Cardio-Pulmonary Department

## 2021-12-30 NOTE — Progress Notes (Signed)
PT Cancellation Note  Patient Details Name: [name]. MRN: [id number] DOB: 03-26-1956   Cancelled Treatment:    Reason Eval/Treat Not Completed: Patient at procedure or test/unavailable. Patient off the floor for cardiac procedure. PT will continue with attempts.   Donna Bernard, PT, MPT   Ina Homes 12/30/2021, 9:58 AM

## 2021-12-30 NOTE — Progress Notes (Signed)
[facility] Progress Note  Patient: [name].    IRW:[id number]  DOA: 12/23/2021    Date of Service: the patient was seen and examined on 12/30/2021  Brief hospital course: 66 year old man with history of chronic systolic congestive heart failure, cardiomyopathy, hypertension, type 2 diabetes mellitus, CAD.  He presented to the hospital on 6/21 with chest pain and shortness of breath.  Stress test was done and showed a fixed defect in the inferior inferolateral and apical region with a low EF.  This was a high risk and in the setting of cardiomyopathy.  Echocardiogram shows an EF of 30 to 35% with global hypokinesis.  The patient was diuresed with IV Lasix.  On 12/26/2021 the patient complained of unsteady gait.  I ended up doing an MRI of the brain which was positive for a stroke in the left frontal area.  Patient will be on aspirin for 20 days and the patient was already on Plavix.  Cardiology took patient for cardiac cath on 6/28 with vessels appearing similar to catheterization done in 2021 including 70% stenosis of OM.  Pulmonary consulted for ongoing shortness of breath.  Assessment and Plan: Assessment and Plan: * Acute on chronic systolic CHF (congestive heart failure) (HCC) Patient coming in with chest pain and shortness of breath.  EF 30 to 35% on echocardiogram and even lower on stress test at 14%.  Lasix switched to 40 mg orally daily as per cardiology.  Hold Entresto for now.  Continue Coreg and Aldactone at lower doses.  Cardiology plans on doing cardiac catheterization tomorrow.  Only diuresed about 1.5 L.  Weight with little change from admission.  Acute CVA (cerebrovascular accident) [facility]) Patient complains of unsteadiness with walking around.  MRI of the brain showed acute infarct left frontal white matter, chronic microhemorrhages.  Patient already on Plavix.  Continue aspirin (for 21 days total).  Neuro consult appreciated.  PT and OT consultations appreciated.  Acute  respiratory failure with hypoxia (HCC) Pulse ox documented 89% on room air.  The other day dropped down to 84% with ambulation.  Yesterday with ambulation was able to hold his saturations in the 90s. Continue off oxygen at this point.  Appears to be more pulmonary in nature than cardiac  Chest pain Status post cardiac catheterization with ongoing CAD.  Appears to be stable with no changes from previous cath  Essential hypertension Continue Coreg.  Hold Entresto currently.  Continue lower dose Aldactone.  Lasix changed over to oral yesterday.  CAD (coronary artery disease) Continue aspirin and Plavix and Coreg.   Hyperlipidemia Myalgias with cholesterol medications.  We will give Zetia to try to get LDL less than 70  Type 2 diabetes mellitus with hyperglycemia (HCC) Patient did not know that he is a diabetic.  Hemoglobin A1c 7.5.  Since cardiology given prednisone for 3 doses for premedication for cardiac cath so patient was given 22 units of Semglee insulin 6/27 night.  likely can lower the dose and potentially go to oral medications upon discharge.  Discussed diabetic diet with patient.  Obesity (BMI 30-39.9) Meets criteria BMI greater than 30       Body mass index is 33.63 kg/m.        Consultants: Neurology Pulmonary Cardiology  Procedures: Status post cardiac cath done 6/28 noting CAD, but stable from previous Echocardiogram done 6/21 noting ejection fraction of 30 to 35% and grade 1 diastolic dysfunction  Antimicrobials: None  Code Status: Full code   Subjective: Patient complains  of shortness of breath  Objective: Vital signs were reviewed and unremarkable. Vitals:   12/30/21 1208 12/30/21 1621  BP: (!) 142/74 (!) 149/90  Pulse: 62 83  Resp: 16   Temp: 97.7 F (36.5 C)   SpO2: 98% 95%    Intake/Output Summary (Last 24 hours) at 12/30/2021 1633 Last data filed at 12/30/2021 1622 Gross per 24 hour  Intake 405 ml  Output 320 ml  Net 85 ml   Filed  Weights   12/29/21 0500 12/29/21 1211 12/30/21 0541  Weight: 98.2 kg 98.2 kg 94.5 kg   Body mass index is 33.63 kg/m.  Exam:  General: Alert and oriented x3, no acute distress HEENT: Normocephalic, atraumatic, mucous membranes are moist Cardiovascular: Regular rate and rhythm, S1-S2, 2 out of 6 systolic ejection murmur Respiratory: Clear, decreased breath sounds throughout Abdomen: Soft, obese, nontender, positive bowel sounds Musculoskeletal: No clubbing or cyanosis or edema Skin: No skin breaks, tears or lesions Psychiatry: Appropriate, no evidence of psychoses Neurology: No focal deficits  Data Reviewed: Noted hyperglycemia  Disposition:  Status is: Inpatient Pulmonary evaluation of shortness of breath    Anticipated discharge date: 6/30 Family Communication: Left message for family DVT Prophylaxis:    Lovenox   Author: Hollice Espy ,MD 12/30/2021 4:33 PM  To reach On-call, see care teams to locate the attending and reach out via [email or web address]. Between 7PM-7AM, please contact night-coverage If you still have difficulty reaching the attending provider, please page the [facility] (Director on Call) for [facility] on amion for assistance.

## 2021-12-30 NOTE — Progress Notes (Signed)
PT Cancellation Note  Patient Details Name: [name]. MRN: [id number] DOB: 07/14/1955   Cancelled Treatment:    Reason Eval/Treat Not Completed: Patient declined, no reason specified. Patient is refusing PT treatment. He is requesting that therapy does not return in the room. He reports ambulating in the room independently. PT will sign off at this time as patient has refused 3 days in a row and requesting no PT follow up. Please re-consult PT if patient is agreeable to participate or as needed.   Donna Bernard, PT, MPT  Ina Homes 12/30/2021, 2:21 PM

## 2021-12-30 NOTE — Interval H&P Note (Signed)
History and Physical Interval Note:  12/30/2021 8:43 AM  [name].  has presented today for surgery, with the diagnosis of acute on chronic HFrEF.  The various methods of treatment have been discussed with the patient and family. After consideration of risks, benefits and other options for treatment, the patient has consented to  Procedure(s): RIGHT/LEFT HEART CATH AND CORONARY ANGIOGRAPHY (N/A) as a surgical intervention.  The patient's history has been reviewed, patient examined, no change in status, stable for surgery.  I have reviewed the patient's chart and labs.  Questions were answered to the patient's satisfaction.    Cath Lab Visit (complete for each Cath Lab visit)  Clinical Evaluation Leading to the Procedure:   ACS: Yes.    Non-ACS:  N/A  Tiya Schrupp

## 2021-12-31 DIAGNOSIS — R0602 Shortness of breath: Secondary | ICD-10-CM | POA: Insufficient documentation

## 2021-12-31 LAB — CBC
HCT: 45.7 % (ref 39.0–52.0)
Hemoglobin: 15.6 g/dL (ref 13.0–17.0)
MCH: 29.7 pg (ref 26.0–34.0)
MCHC: 34.1 g/dL (ref 30.0–36.0)
MCV: 87 fL (ref 80.0–100.0)
Platelets: 233 10*3/uL (ref 150–400)
RBC: 5.25 MIL/uL (ref 4.22–5.81)
RDW: 13.6 % (ref 11.5–15.5)
WBC: 17.6 10*3/uL — ABNORMAL HIGH (ref 4.0–10.5)
nRBC: 0 % (ref 0.0–0.2)

## 2021-12-31 LAB — BASIC METABOLIC PANEL
Anion gap: 5 (ref 5–15)
BUN: 34 mg/dL — ABNORMAL HIGH (ref 8–23)
CO2: 26 mmol/L (ref 22–32)
Calcium: 8.6 mg/dL — ABNORMAL LOW (ref 8.9–10.3)
Chloride: 106 mmol/L (ref 98–111)
Creatinine, Ser: 1.1 mg/dL (ref 0.61–1.24)
GFR, Estimated: >60 mL/min (ref >60)
Glucose, Bld: 183 mg/dL — ABNORMAL HIGH (ref 70–99)
Potassium: 4.1 mmol/L (ref 3.5–5.1)
Sodium: 137 mmol/L (ref 135–145)

## 2021-12-31 LAB — GLUCOSE, CAPILLARY
Glucose-Capillary: 163 mg/dL — ABNORMAL HIGH (ref 70–99)
Glucose-Capillary: 173 mg/dL — ABNORMAL HIGH (ref 70–99)
Glucose-Capillary: 122 mg/dL — ABNORMAL HIGH (ref 70–99)

## 2021-12-31 MED ORDER — FUROSEMIDE 40 MG PO TABS: 40.0000 mg | ORAL_TABLET | Freq: Every day | ORAL | 3 refills | Status: DC | PRN

## 2021-12-31 MED ORDER — METFORMIN HCL 500 MG PO TABS: 500.0000 mg | ORAL_TABLET | Freq: Two times a day (BID) | ORAL | 11 refills | Status: DC

## 2021-12-31 MED ORDER — SACUBITRIL-VALSARTAN 24-26 MG PO TABS: 1.0000 | ORAL_TABLET | Freq: Two times a day (BID) | ORAL | 3 refills | Status: DC

## 2021-12-31 MED ORDER — ALBUTEROL SULFATE (2.5 MG/3ML) 0.083% IN NEBU: 3.0000 mL | INHALATION_SOLUTION | Freq: Four times a day (QID) | RESPIRATORY_TRACT | 12 refills | Status: DC | PRN

## 2021-12-31 MED ORDER — ASPIRIN 81 MG PO TBEC: 81.0000 mg | DELAYED_RELEASE_TABLET | Freq: Every day | ORAL | 12 refills | Status: DC

## 2021-12-31 MED ORDER — CARVEDILOL 3.125 MG PO TABS: 3.1250 mg | ORAL_TABLET | Freq: Two times a day (BID) | ORAL | 3 refills | Status: DC

## 2021-12-31 MED ORDER — SACUBITRIL-VALSARTAN 24-26 MG PO TABS
1.0000 | ORAL_TABLET | Freq: Two times a day (BID) | ORAL | Status: DC
  Administered 2021-12-31: 1 via ORAL
  Filled 2021-12-31: qty 1

## 2021-12-31 MED ORDER — ISOSORBIDE MONONITRATE ER 30 MG PO TB24: 15.0000 mg | ORAL_TABLET | Freq: Every day | ORAL | 3 refills | Status: DC

## 2021-12-31 MED ORDER — EZETIMIBE 10 MG PO TABS: 10.0000 mg | ORAL_TABLET | Freq: Every day | ORAL | 1 refills | Status: DC

## 2021-12-31 MED ORDER — CLOPIDOGREL BISULFATE 75 MG PO TABS: 75.0000 mg | ORAL_TABLET | Freq: Every day | ORAL | 3 refills | Status: DC

## 2021-12-31 MED ORDER — RANOLAZINE ER 500 MG PO TB12: 500.0000 mg | ORAL_TABLET | Freq: Two times a day (BID) | ORAL | 2 refills | Status: DC

## 2021-12-31 MED ORDER — MOMETASONE FURO-FORMOTEROL FUM 200-5 MCG/ACT IN AERO: 2.0000 | INHALATION_SPRAY | Freq: Two times a day (BID) | RESPIRATORY_TRACT | 3 refills | Status: DC

## 2021-12-31 MED ORDER — NITROGLYCERIN 0.4 MG SL SUBL: 0.4000 mg | SUBLINGUAL_TABLET | SUBLINGUAL | 3 refills | Status: DC | PRN

## 2021-12-31 MED ORDER — SPIRONOLACTONE 25 MG PO TABS: 12.5000 mg | ORAL_TABLET | Freq: Every day | ORAL | 3 refills | Status: DC

## 2021-12-31 NOTE — Discharge Summary (Addendum)
Physician Discharge Summary   Patient: [name]. MRN: [id number] DOB: [birth year]  Admit date:     12/23/2021  Discharge date: 12/31/21  Discharge Physician: Hollice Espy   PCP: Pcp, No   Recommendations at discharge:   Patient had stopped most of his medications.  Refill prescriptions given for his Coreg, Lasix, Imdur, nitroglycerin, metformin and Plavix. Patient will follow-up with pulmonary on 9/11 Patient will follow-up with cardiology in the next few weeks Medication change: Jardiance discontinued. Medication change: Entresto restarted, but now at 24/26 mg p.o. twice daily New medication: Aspirin 81 mg p.o. daily x17 more days for total of 21 days of therapy New medication: Zetia 10 mg p.o. daily New medication: Ranexa 500 mg p.o. twice daily New medication: Aldactone 12.5 mg p.o. daily New medication: Bretztri inhaler 2 puffs twice daily  Discharge Diagnoses: Principal Problem:   Acute on chronic systolic CHF (congestive heart failure) (HCC) Active Problems:   Acute CVA (cerebrovascular accident) (HCC)   Acute respiratory failure with hypoxia (HCC)   Chest pain   CAD (coronary artery disease)   Essential hypertension   Hyperlipidemia   Type 2 diabetes mellitus with hyperglycemia (HCC)   Obesity (BMI 30-39.9)  Resolved Problems:   * No resolved hospital problems. *  Hospital Course: 66 year old man with history of chronic systolic congestive heart failure, cardiomyopathy, hypertension, type 2 diabetes mellitus, CAD.  He presented to the hospital on 6/21 with chest pain and shortness of breath.  Stress test was done and showed a fixed defect in the inferior inferolateral and apical region with a low EF.  This was a high risk and in the setting of cardiomyopathy.  Echocardiogram shows an EF of 30 to 35% with global hypokinesis.  The patient was diuresed with IV Lasix.  On 12/26/2021 the patient complained of unsteady gait.  I ended up doing an MRI of the brain which  was positive for a stroke in the left frontal area.  Patient will be on aspirin for 20 days and the patient was already on Plavix.  Cardiology took patient for cardiac cath on 6/28 with vessels appearing similar to catheterization done in 2021 including 70% stenosis of OM.  Pulmonary consulted for ongoing shortness of breath, who suspected patient also has COPD.  Bretzi inhaler added to his regimen and they will see him in the office.    Assessment and Plan: * Acute on chronic systolic CHF (congestive heart failure) (HCC) Patient coming in with chest pain and shortness of breath.  EF 30 to 35% on echocardiogram and even lower on stress test at 14%.  Lasix switched to 40 mg orally daily as per cardiology.  Hold Entresto for now.  Continue Coreg and Aldactone at lower doses.  Patient has stopped all of his medications before coming in and so new prescriptions given.  Cardiology plans on doing cardiac catheterization tomorrow.  Only diuresed about 1. 6 L.  Weight with little change from admission.  Acute CVA (cerebrovascular accident) [facility]) Patient complains of unsteadiness with walking around.  MRI of the brain showed acute infarct left frontal white matter, chronic microhemorrhages.  Patient already on Plavix, although he had not been taking this medication.  Restarted during hospitalization and new prescription given for Plavix.  Aspirin x3 weeks.  Neuro consult appreciated.  Cleared by OT.  PT recommended home health PT.  Acute respiratory failure with hypoxia (HCC) Pulse ox documented 89% on room air.  The other day dropped down to 84% with  ambulation.  Has been able to wean off of oxygen with diuresis.  At times patient still feels short of breath.  Pulmonary was consulted who added some medications to his regimen.  They suspect he has underlying COPD however this is not yet been formally diagnosed.  Patient will be discharged on a Bretzi inhaler.  Appointment made in September with pulmonary.  Chest  pain Status post cardiac catheterization with ongoing CAD.  Appears to be stable with no changes from previous cath.  Chest pain has resolved.  May have been bronchospasm  Essential hypertension Continue Coreg.  Hold Entresto currently.  Continue lower dose Aldactone.  Lasix changed over to oral yesterday.  Patient had previously been on these medications at home and had stopped taking them so new prescriptions given.  CAD (coronary artery disease) Continue aspirin and Plavix and Coreg.  Please note that patient had been noncompliant and has stopped taking all of his medications.  Prescriptions given for aspirin and Plavix and Coreg.   Hyperlipidemia Myalgias with cholesterol medications.  We will give Zetia to try to get LDL less than 70  Type 2 diabetes mellitus with hyperglycemia ([place]) Patient did not know that he is a diabetic although he was previously on Jardiance and metformin, but had stopped taking these medications.  Hemoglobin A1c 7.5.  Since cardiology given prednisone for 3 doses for premedication for cardiac cath so patient was given 22 units of Semglee insulin 6/27 night treating hyperglycemia.  On discharge, new prescription given for metformin.  Discussed diabetic diet with patient.  Obesity (BMI 30-39.9) Meets criteria BMI greater than 30         Consultants: Pulmonary, cardiology, neurology Procedures: Status post cardiac cath done 6/28 noting CAD, but stable from previous Echocardiogram done 6/21 noting ejection fraction of 30 to 35% and grade 1 diastolic dysfunction Disposition: Home health Diet recommendation:  Discharge Diet Orders (From admission, onward)     Start     Ordered   12/31/21 0000  Diet - low sodium heart healthy        12/31/21 1357           Cardiac diet DISCHARGE MEDICATION: Allergies as of 12/31/2021       Reactions   Contrast Media [iodinated Contrast Media] Shortness Of Breath   Iohexol Shortness Of Breath    Onset Date:  [id number]   Jardiance [empagliflozin] Other (See Comments)   Fatigue/weakness   Atorvastatin Other (See Comments)   Myalgias   Hydrocodone Itching   Morphine And Related Other (See Comments)   Lost control    Penicillins Other (See Comments)   Unknown reaction Did it involve swelling of the face/tongue/throat, SOB, or low BP? Unknown Did it involve sudden or severe rash/hives, skin peeling, or any reaction on the inside of your mouth or nose? Unknown Did you need to seek medical attention at a hospital or doctor's office? Yes When did it last happen?      Childhood allergy  If all above answers are "NO", may proceed with cephalosporin use.   Rosuvastatin Other (See Comments)   Myalgias        Medication List     STOP taking these medications    empagliflozin 10 MG Tabs tablet Commonly known as: JARDIANCE   Entresto 49-51 MG Generic drug: sacubitril-valsartan Replaced by: sacubitril-valsartan 24-26 MG       TAKE these medications    albuterol (2.5 MG/3ML) 0.083% nebulizer solution Commonly known as: PROVENTIL Inhale 3 mLs into  the lungs every 6 (six) hours as needed for wheezing or shortness of breath.   aspirin EC 81 MG tablet Take 1 tablet (81 mg total) by mouth daily. Swallow whole. Start taking on: January 01, 2022   Breztri Aerosphere 160-9-4.8 MCG/ACT Aero Generic drug: Budeson-Glycopyrrol-Formoterol Inhale 2 puffs into the lungs in the morning and at bedtime.   carvedilol 3.125 MG tablet Commonly known as: COREG Take 1 tablet (3.125 mg total) by mouth 2 (two) times daily with a meal.   clopidogrel 75 MG tablet Commonly known as: PLAVIX Take 1 tablet (75 mg total) by mouth once daily.   ezetimibe 10 MG tablet Commonly known as: ZETIA Take 1 tablet (10 mg total) by mouth daily. Start taking on: January 01, 2022   furosemide 40 MG tablet Commonly known as: LASIX Take 1 tablet (40 mg total) by mouth once daily as needed.   isosorbide mononitrate 30 MG 24  hr tablet Commonly known as: IMDUR Take (1/2) tablet (15 mg total) by mouth once daily.   metFORMIN 500 MG tablet Commonly known as: Glucophage Take 1 tablet (500 mg total) by mouth 2 (two) times daily with a meal.   nitroGLYCERIN 0.4 MG SL tablet Commonly known as: NITROSTAT Place 1 tablet (0.4 mg total) under the tongue every 5 (five) minutes as needed for chest pain.   ranolazine 500 MG 12 hr tablet Commonly known as: RANEXA Take 1 tablet (500 mg total) by mouth 2 (two) times daily.   sacubitril-valsartan 24-26 MG Commonly known as: ENTRESTO Take 1 tablet by mouth 2 (two) times daily. Replaces: Entresto 49-51 MG   spironolactone 25 MG tablet Commonly known as: ALDACTONE Take 0.5 tablets (12.5 mg total) by mouth daily. Start taking on: January 01, 2022        Follow-up Information     Leandrew Koyanagi, MD .   Specialty: Ophthalmology Contact information: [place] [address] [phone]         Wellington Hampshire, MD .   Specialty: Cardiology Contact information: [place] [address] [phone]         Flora Lipps, MD. Go on 03/15/2022.   Specialties: Pulmonary Disease, Cardiology Why: at 10:30am Contact information: [place] [address] [phone]                Discharge Exam: Danley Danker Weights   12/29/21 1211 12/30/21 0541 12/31/21 0436  Weight: 98.2 kg 94.5 kg 94.2 kg   General: Alert and oriented x3, no acute distress Cardiovascular: Regular rate and rhythm, S1-S2 Lungs: Clear to auscultation bilaterally  Condition at discharge: improving  The results of significant diagnostics from this hospitalization (including imaging, microbiology, ancillary and laboratory) are listed below for reference.   Imaging Studies: DG Chest Port 1 View  Result Date: 12/30/2021 CLINICAL DATA:  Shortness of breath EXAM: PORTABLE CHEST 1 VIEW COMPARISON:  Chest  x-ray dated December 23, 2021 FINDINGS: Cardiac and mediastinal contours are unchanged. Lungs are clear. No large pleural effusion or pneumothorax. IMPRESSION: No active disease. Electronically Signed   By: Yetta Glassman [name]   On: 12/30/2021 17:13   CARDIAC CATHETERIZATION  Result Date: 12/30/2021 Conclusions: Severe multivessel coronary artery disease, overall relatively similar to most recent cath in 2021.  Mid LCx stent placed at that time demonstrates mild in-stent restenosis in the proximal segment.  Eccentric ostial OM2 lesion appears similar. Mildly elevated left heart and pulmonary artery pressures. Mildly  reduced Fick cardiac output/index. Recommendations: Continue indefinite DAPT with aspirin and clopidogrel. Continue gentle diuresis; I will add back furosemide 40 mg daily.  Escalate GDMT for HFrEF, as blood pressure and renal function allow. Add ranolazine 500 mg BID for antianginal therapy.  EKG to be obtained tomorrow AM to reassess QT interval. Consider pulmonary consultation, as degree of dyspnea seems to be out of proportion to heart failure/coronary artery disease. If the patient has refractory symptoms in spite of aforementioned interventions, PCI to OM2 may need to be considered.  If possible, this should be deferred for at least 2 weeks from the time of recent brain MRI demonstrating acute stroke in order to minimize risk for periprocedural intracranial hemorrhage. Aggressive secondary prevention of coronary artery disease. Yvonne Kendall, MD [facility]  MR BRAIN W WO CONTRAST  Result Date: 12/26/2021 CLINICAL DATA:  Acute neuro deficit.  Rule out stroke EXAM: MRI HEAD WITHOUT AND WITH CONTRAST MRA HEAD WITHOUT CONTRAST MRA NECK WITHOUT AND WITH CONTRAST TECHNIQUE: Multiplanar, multiecho pulse sequences of the brain and surrounding structures were obtained without and with intravenous contrast. Angiographic images of the Circle of Willis were obtained using MRA technique without  intravenous contrast. Angiographic images of the neck were obtained using MRA technique without and with intravenous contrast. Carotid stenosis measurements (when applicable) are obtained utilizing NASCET criteria, using the distal internal carotid diameter as the denominator. CONTRAST:  6mL GADAVIST GADOBUTROL 1 MMOL/ML IV SOLN COMPARISON:  CT head 12/23/2021 FINDINGS: MRI HEAD FINDINGS Brain: Small acute infarct left frontal white matter. No other acute infarct. Moderate chronic microvascular ischemic change in the white matter. Brainstem normal. Small chronic infarcts in the cerebellum bilaterally. Scattered areas of chronic microhemorrhage in the occipital parietal lobes and thalamus bilaterally. Chronic microhemorrhage in the left cerebellum. No mass lesion. Normal enhancement postcontrast infusion. Vascular: Normal arterial flow voids Skull and upper cervical spine: No focal skeletal lesion. Sinuses/Orbits: Mild mucosal edema paranasal sinuses. Negative orbit Other: None MRA HEAD FINDINGS Internal carotid artery widely patent bilaterally. Anterior and middle cerebral arteries widely patent bilaterally without stenosis. Both vertebral arteries patent to the basilar. Basilar widely patent. Posterior cerebral arteries patent bilaterally. MRA NECK FINDINGS Normal aortic arch and proximal great vessels. Left vertebral artery origin from the arch. Carotid artery widely patent bilaterally. Both vertebral arteries patent to the basilar without stenosis. IMPRESSION: 1. Small area of acute infarction left frontal white matter. Moderate chronic microvascular ischemic change in the white matter. Multiple areas of chronic microhemorrhage in the brain correlate with hypertension history 2. Negative MRA head 3. Negative MRA neck Electronically Signed   By: Marlan Palau [name]   On: 12/26/2021 14:57   MR ANGIO HEAD WO CONTRAST  Result Date: 12/26/2021 CLINICAL DATA:  Acute neuro deficit.  Rule out stroke EXAM: MRI HEAD  WITHOUT AND WITH CONTRAST MRA HEAD WITHOUT CONTRAST MRA NECK WITHOUT AND WITH CONTRAST TECHNIQUE: Multiplanar, multiecho pulse sequences of the brain and surrounding structures were obtained without and with intravenous contrast. Angiographic images of the Circle of Willis were obtained using MRA technique without intravenous contrast. Angiographic images of the neck were obtained using MRA technique without and with intravenous contrast. Carotid stenosis measurements (when applicable) are obtained utilizing NASCET criteria, using the distal internal carotid diameter as the denominator. CONTRAST:  26mL GADAVIST GADOBUTROL 1 MMOL/ML IV SOLN COMPARISON:  CT head 12/23/2021 FINDINGS: MRI HEAD FINDINGS Brain: Small acute infarct left frontal white matter. No other acute infarct. Moderate chronic microvascular ischemic change in the white  matter. Brainstem normal. Small chronic infarcts in the cerebellum bilaterally. Scattered areas of chronic microhemorrhage in the occipital parietal lobes and thalamus bilaterally. Chronic microhemorrhage in the left cerebellum. No mass lesion. Normal enhancement postcontrast infusion. Vascular: Normal arterial flow voids Skull and upper cervical spine: No focal skeletal lesion. Sinuses/Orbits: Mild mucosal edema paranasal sinuses. Negative orbit Other: None MRA HEAD FINDINGS Internal carotid artery widely patent bilaterally. Anterior and middle cerebral arteries widely patent bilaterally without stenosis. Both vertebral arteries patent to the basilar. Basilar widely patent. Posterior cerebral arteries patent bilaterally. MRA NECK FINDINGS Normal aortic arch and proximal great vessels. Left vertebral artery origin from the arch. Carotid artery widely patent bilaterally. Both vertebral arteries patent to the basilar without stenosis. IMPRESSION: 1. Small area of acute infarction left frontal white matter. Moderate chronic microvascular ischemic change in the white matter. Multiple areas  of chronic microhemorrhage in the brain correlate with hypertension history 2. Negative MRA head 3. Negative MRA neck Electronically Signed   By: Franchot Gallo [name]   On: 12/26/2021 14:57   MR ANGIO NECK W WO CONTRAST  Result Date: 12/26/2021 CLINICAL DATA:  Acute neuro deficit.  Rule out stroke EXAM: MRI HEAD WITHOUT AND WITH CONTRAST MRA HEAD WITHOUT CONTRAST MRA NECK WITHOUT AND WITH CONTRAST TECHNIQUE: Multiplanar, multiecho pulse sequences of the brain and surrounding structures were obtained without and with intravenous contrast. Angiographic images of the Circle of Willis were obtained using MRA technique without intravenous contrast. Angiographic images of the neck were obtained using MRA technique without and with intravenous contrast. Carotid stenosis measurements (when applicable) are obtained utilizing NASCET criteria, using the distal internal carotid diameter as the denominator. CONTRAST:  85mL GADAVIST GADOBUTROL 1 MMOL/ML IV SOLN COMPARISON:  CT head 12/23/2021 FINDINGS: MRI HEAD FINDINGS Brain: Small acute infarct left frontal white matter. No other acute infarct. Moderate chronic microvascular ischemic change in the white matter. Brainstem normal. Small chronic infarcts in the cerebellum bilaterally. Scattered areas of chronic microhemorrhage in the occipital parietal lobes and thalamus bilaterally. Chronic microhemorrhage in the left cerebellum. No mass lesion. Normal enhancement postcontrast infusion. Vascular: Normal arterial flow voids Skull and upper cervical spine: No focal skeletal lesion. Sinuses/Orbits: Mild mucosal edema paranasal sinuses. Negative orbit Other: None MRA HEAD FINDINGS Internal carotid artery widely patent bilaterally. Anterior and middle cerebral arteries widely patent bilaterally without stenosis. Both vertebral arteries patent to the basilar. Basilar widely patent. Posterior cerebral arteries patent bilaterally. MRA NECK FINDINGS Normal aortic arch and proximal  great vessels. Left vertebral artery origin from the arch. Carotid artery widely patent bilaterally. Both vertebral arteries patent to the basilar without stenosis. IMPRESSION: 1. Small area of acute infarction left frontal white matter. Moderate chronic microvascular ischemic change in the white matter. Multiple areas of chronic microhemorrhage in the brain correlate with hypertension history 2. Negative MRA head 3. Negative MRA neck Electronically Signed   By: Franchot Gallo [name]   On: 12/26/2021 14:57   US Venous Img Lower Bilateral (DVT)  Result Date: 12/26/2021 CLINICAL DATA:  LEFT lower extremity swelling.  Chest pain. EXAM: BILATERAL LOWER EXTREMITY VENOUS DOPPLER ULTRASOUND TECHNIQUE: Gray-scale sonography with graded compression, as well as color Doppler and duplex ultrasound were performed to evaluate the lower extremity deep venous systems from the level of the common femoral vein and including the common femoral, femoral, profunda femoral, popliteal and calf veins including the posterior tibial, peroneal and gastrocnemius veins when visible. The superficial great saphenous vein was also interrogated. Spectral Doppler was utilized to  evaluate flow at rest and with distal augmentation maneuvers in the common femoral, femoral and popliteal veins. COMPARISON:  Chest XR, 12/23/2021. FINDINGS: RIGHT LOWER EXTREMITY VENOUS Normal compressibility of the RIGHT common femoral, superficial femoral, and popliteal veins, as well as the visualized calf veins. Visualized portions of profunda femoral vein and great saphenous vein unremarkable. No filling defects to suggest DVT on grayscale or color Doppler imaging. Doppler waveforms show normal direction of venous flow, normal respiratory plasticity and response to augmentation. OTHER No evidence of superficial thrombophlebitis or abnormal fluid collection. Limitations: none LEFT LOWER EXTREMITY VENOUS Normal compressibility of the LEFT common femoral, superficial  femoral, and popliteal veins, as well as the visualized calf veins. Visualized portions of profunda femoral vein and great saphenous vein unremarkable. No filling defects to suggest DVT on grayscale or color Doppler imaging. Doppler waveforms show normal direction of venous flow, normal respiratory plasticity and response to augmentation. OTHER No evidence of superficial thrombophlebitis or abnormal fluid collection. Limitations: none IMPRESSION: No evidence of femoropopliteal DVT within either lower extremity. Michaelle Birks, MD [facility] Electronically Signed   By: Michaelle Birks [name]   On: 12/26/2021 09:17   NM Myocar Multi W/Spect W/Wall Motion / EF  Result Date: 12/24/2021 Pharmacological myocardial perfusion imaging study with no significant  ischemia Fixed defect in the inferior, inferolateral and apical region Inferior and inferolateral wall and apical wall hypokinesis , EF estimated at 14% No EKG changes concerning for ischemia at peak stress or in recovery. CT attenuation correction images with three-vessel coronary calcification High risk scan in the setting of cardiomyopathy Signed, Esmond Plants, MD, Ph.D [facility]   ECHOCARDIOGRAM COMPLETE  Result Date: 12/24/2021    ECHOCARDIOGRAM REPORT   Patient Name:   Amelia Oleson. Date of Exam: 12/23/2021 Medical Rec #:  [id number]         Height:       66.0 in Accession #:    [id number]        Weight:       217.0 lb Date of Birth:  10/31/1955          BSA:          2.070 m Patient Age:    66 years          BP:           169/98 mmHg Patient Gender: M                 HR:           68 bpm. Exam Location:  [facility] Procedure: 2D Echo, Cardiac Doppler, Color Doppler and Intracardiac            Opacification Agent Indications:     R94.31 Abnormal EKG  History:         Patient has prior history of Echocardiogram examinations, most                  recent 06/19/2021. Ischemic cardiomyopathy, CAD, Stroke,                   Arrythmias:PVC, Signs/Symptoms:Dyspnea; Risk                  Factors:Hypertension, Dyslipidemia, Diabetes and Sleep Apnea.                  Chronic systolic and diastolic heart failure.  Sonographer:     Cresenciano Lick RDCS Referring Phys:  [id number] [facility]  Diagnosing Phys: Ida Rogue MD IMPRESSIONS  1. Left ventricular ejection fraction, by estimation, is 30 to 35%. The left ventricle has moderately decreased function. The left ventricle demonstrates global hypokinesis. Left ventricular diastolic parameters are consistent with Grade I diastolic dysfunction (impaired relaxation).  2. Right ventricular systolic function is normal. The right ventricular size is normal. Tricuspid regurgitation signal is inadequate for assessing PA pressure.  3. The mitral valve is normal in structure. Mild mitral valve regurgitation. No evidence of mitral stenosis.  4. The aortic valve is normal in structure. Aortic valve regurgitation is not visualized. Aortic valve sclerosis is present, with no evidence of aortic valve stenosis. FINDINGS  Left Ventricle: Left ventricular ejection fraction, by estimation, is 30 to 35%. The left ventricle has moderately decreased function. The left ventricle demonstrates global hypokinesis. Definity contrast agent was given IV to delineate the left ventricular endocardial borders. The left ventricular internal cavity size was normal in size. There is no left ventricular hypertrophy. Left ventricular diastolic parameters are consistent with Grade I diastolic dysfunction (impaired relaxation). Right Ventricle: The right ventricular size is normal. No increase in right ventricular wall thickness. Right ventricular systolic function is normal. Tricuspid regurgitation signal is inadequate for assessing PA pressure. Left Atrium: Left atrial size was normal in size. Right Atrium: Right atrial size was normal in size. Pericardium: There is no evidence of pericardial effusion.  Mitral Valve: The mitral valve is normal in structure. Mild mitral valve regurgitation. No evidence of mitral valve stenosis. Tricuspid Valve: The tricuspid valve is normal in structure. Tricuspid valve regurgitation is not demonstrated. No evidence of tricuspid stenosis. Aortic Valve: The aortic valve is normal in structure. Aortic valve regurgitation is not visualized. Aortic valve sclerosis is present, with no evidence of aortic valve stenosis. Pulmonic Valve: The pulmonic valve was normal in structure. Pulmonic valve regurgitation is not visualized. No evidence of pulmonic stenosis. Aorta: The aortic root is normal in size and structure. Ascending aorta measurements are within normal limits for age when indexed to body surface area. Venous: The pulmonary veins were not well visualized. The inferior vena cava was not well visualized. IAS/Shunts: No atrial level shunt detected by color flow Doppler.  LEFT VENTRICLE PLAX 2D LVIDd:         5.70 cm   Diastology LVIDs:         5.10 cm   LV e' medial:    3.48 cm/s LV PW:         1.00 cm   LV E/e' medial:  10.8 LV IVS:        1.00 cm   LV e' lateral:   4.46 cm/s LVOT diam:     2.20 cm   LV E/e' lateral: 8.5 LV SV:         35 LV SV Index:   17 LVOT Area:     3.80 cm  RIGHT VENTRICLE RV Basal diam:  4.70 cm RV S prime:     10.52 cm/s TAPSE (M-mode): 2.4 cm LEFT ATRIUM             Index        RIGHT ATRIUM           Index LA diam:        4.50 cm 2.17 cm/m   RA Area:     18.00 cm LA Vol (A2C):   87.1 ml 42.07 ml/m  RA Volume:   53.80 ml  25.99 ml/m LA Vol (A4C):   36.5 ml 17.63  ml/m LA Biplane Vol: 62.5 ml 30.19 ml/m  AORTIC VALVE LVOT Vmax:   48.65 cm/s LVOT Vmean:  34.100 cm/s LVOT VTI:    0.091 m  AORTA Ao Root diam: 4.00 cm Ao Asc diam:  3.70 cm MITRAL VALVE MV Area (PHT): 3.72 cm    SHUNTS MV Decel Time: 204 msec    Systemic VTI:  0.09 m MV E velocity: 37.70 cm/s  Systemic Diam: 2.20 cm MV A velocity: 62.10 cm/s MV E/A ratio:  0.61 Ida Rogue MD  Electronically signed by Ida Rogue MD Signature Date/Time: 12/24/2021/1:15:16 PM    Final    CT Head Wo Contrast  Result Date: 12/23/2021 CLINICAL DATA:  Headache EXAM: CT HEAD WITHOUT CONTRAST TECHNIQUE: Contiguous axial images were obtained from the base of the skull through the vertex without intravenous contrast. RADIATION DOSE REDUCTION: This exam was performed according to the departmental dose-optimization program which includes automated exposure control, adjustment of the mA and/or kV according to patient size and/or use of iterative reconstruction technique. COMPARISON:  CT head 06/19/2021 FINDINGS: Brain: No acute intracranial hemorrhage, mass effect, or herniation. No extra-axial fluid collections. No evidence of acute territorial infarct. No hydrocephalus. Moderate cortical volume loss. Extensive patchy hypodensities throughout the periventricular and subcortical white matter, likely secondary to chronic microvascular ischemic changes. Small old infarct in the left posterior periventricular white matter. Vascular: No hyperdense vessel or unexpected calcification. Skull: Normal. Negative for fracture or focal lesion. Sinuses/Orbits: No acute finding. Chronic moderate mucosal opacification of the left sphenoid sinus. Other: None. IMPRESSION: Chronic changes as described with no acute intracranial process identified. Electronically Signed   By: Ofilia Neas [name]   On: 12/23/2021 11:44   DG Chest 2 View  Result Date: 12/23/2021 CLINICAL DATA:  Chest pain, dizziness, and shortness of breath. EXAM: CHEST - 2 VIEW COMPARISON:  Chest radiograph 06/17/2021 FINDINGS: The cardiomediastinal silhouette is unchanged with upper limits of normal heart size and mild chronic prominence of the central pulmonary arteries. Mild scarring is noted in the right middle lobe. No acute airspace consolidation, edema, pleural effusion, or pneumothorax is identified. No acute osseous abnormality is seen. IMPRESSION:  No active cardiopulmonary disease. Electronically Signed   By: Logan Bores [name]   On: 12/23/2021 10:24    Microbiology: Results for orders placed or performed during the hospital encounter of 06/17/21  Resp Panel by RT-PCR (Flu A&B, Covid) Nasopharyngeal Swab     Status: None   Collection Time: 06/17/21  4:45 PM   Specimen: Nasopharyngeal Swab; Nasopharyngeal(NP) swabs in vial transport medium  Result Value Ref Range Status   SARS Coronavirus 2 by RT PCR NEGATIVE NEGATIVE Final    Comment: (NOTE) SARS-CoV-2 target nucleic acids are NOT DETECTED.  The SARS-CoV-2 RNA is generally detectable in upper respiratory specimens during the acute phase of infection. The lowest concentration of SARS-CoV-2 viral copies this assay can detect is 138 copies/mL. A negative result does not preclude SARS-Cov-2 infection and should not be used as the sole basis for treatment or other patient management decisions. A negative result may occur with  improper specimen collection/handling, submission of specimen other than nasopharyngeal swab, presence of viral mutation(s) within the areas targeted by this assay, and inadequate number of viral copies(<138 copies/mL). A negative result must be combined with clinical observations, patient history, and epidemiological information. The expected result is Negative.  Fact Sheet for Patients:  EntrepreneurPulse.com.au  Fact Sheet for Healthcare Providers:  IncredibleEmployment.be  This test is no t yet approved or  cleared by the [place] and  has been authorized for detection and/or diagnosis of SARS-CoV-2 by FDA under an Emergency Use Authorization (EUA). This EUA will remain  in effect (meaning this test can be used) for the duration of the COVID-19 declaration under Section 564(b)(1) of the Act, 21 U.S.C.section 360bbb-3(b)(1), unless the authorization is terminated  or revoked sooner.       Influenza A by PCR  NEGATIVE NEGATIVE Final   Influenza B by PCR NEGATIVE NEGATIVE Final    Comment: (NOTE) The Xpert Xpress SARS-CoV-2/FLU/RSV plus assay is intended as an aid in the diagnosis of influenza from Nasopharyngeal swab specimens and should not be used as a sole basis for treatment. Nasal washings and aspirates are unacceptable for Xpert Xpress SARS-CoV-2/FLU/RSV testing.  Fact Sheet for Patients: EntrepreneurPulse.com.au  Fact Sheet for Healthcare Providers: IncredibleEmployment.be  This test is not yet approved or cleared by the [place] FDA and has been authorized for detection and/or diagnosis of SARS-CoV-2 by FDA under an Emergency Use Authorization (EUA). This EUA will remain in effect (meaning this test can be used) for the duration of the COVID-19 declaration under Section 564(b)(1) of the Act, 21 U.S.C. section 360bbb-3(b)(1), unless the authorization is terminated or revoked.  Performed at [facility], [address] [place], [place] [address]     Labs: CBC: Recent Labs  Lab 12/25/21 [phone] 12/27/21 0621 12/29/21 0616 12/31/21 0430  WBC 12.3* 11.1* 11.5* 17.6*  HGB 16.9 15.7 16.3 15.6  HCT 49.5 46.4 47.7 45.7  MCV 86.5 87.5 87.4 87.0  PLT 239 232 231 0000000   Basic Metabolic Panel: Recent Labs  Lab 12/27/21 0621 12/28/21 0619 12/29/21 0616 12/30/21 0507 12/31/21 0430  NA 137 136 138 136 137  K 3.4* 3.8 3.7 4.8 4.1  CL 107 104 106 105 106  CO2 25 24 24 22 26   GLUCOSE 211* 277* 227* 336* 183*  BUN 32* 33* 36* 34* 34*  CREATININE 0.90 1.06 1.14 1.09 1.10  CALCIUM 8.9 9.4 9.2 9.7 8.6*   Liver Function Tests: No results for input(s): "AST", "ALT", "ALKPHOS", "BILITOT", "PROT", "ALBUMIN" in the last 168 hours. CBG: Recent Labs  Lab 12/30/21 1206 12/30/21 1621 12/30/21 2055 12/31/21 0736 12/31/21 1202  GLUCAP 279* 371* 304* 163* 173*    Discharge time spent: greater than 30 minutes.  Signed: Annita Brod, MD [facility] 12/31/2021

## 2021-12-31 NOTE — Evaluation (Signed)
Clinical/Bedside Swallow Evaluation Patient Details  Name: [name]. MRN: [id number] Date of Birth: 07-30-1955  Today's Date: 12/31/2021 Time: SLP Start Time (ACUTE ONLY): 5366 SLP Stop Time (ACUTE ONLY): 0845 SLP Time Calculation (min) (ACUTE ONLY): 10 min  Past Medical History:  Past Medical History:  Diagnosis Date   Anginal pain (HCC)    Arthritis    CAD (coronary artery disease)    a. s/p MI with LAD and Diag stenting @ [name];  b. 06/2015 Cath: LAD 73m/d ISR, 100 RCA (ISR) w/ L->R collats, otw mod nonobs dzs-->Med Rx; c. 08/2019 Cath: LM nl, LAD 10p/m ISR, D1 20, D2 100, RI min irregs, LCX 68m/d, OM1 100, OM2 50, RCA 100p, 70d. RPDA fills via collats from LAD. EF 25-35%-->Med Rx; d. 10/2019 NSTEMI/Cath: LCX now 100 (2.75x15 Resolute Onyx DES), otw stable compared to 08/2019.   Chronic combined systolic and diastolic CHF (congestive heart failure) (HCC)    a. 06/2015 Echo: EF 20-25%, Gr3 DD; b. 10/2019 Echo: EF 25-30%, glob HK, sev inf/infapical HK. Mod dil LA.   Dyspnea    Essential hypertension    Headache 12/23/2021   Hyperlipidemia    Hypokalemia    a. 06/2015 in setting of diuresis.   Ischemic cardiomyopathy    a. 2011 EF 45% ([name]);  b. 06/2015 Echo: EF 20-25%; c. 04/2019 Echo: EF 40-45%; d. 08/2019 LV gram: EF 25-35%; e. 10/2019 Echo: EF 25-30%.   PVC's (premature ventricular contractions)    a. 09/2019 Zio (3 days): Avg hr 70, 4 runs NSVT, 5 runs SVT, rare PACs, frequent PVCs w/ 11.8% burden.   Sleep apnea    Stroke/Right temporal lobe infarction [facility])    a. 10/2019 MRI brain: 1cm acute ischemic nonhemorrhagic R temporal lobe infarct. Age-related cerebral atrophy w/ moderate chronic small vessel ischemic dzs.   Type 2 diabetes mellitus with hyperglycemia (HCC) 10/08/2019   Past Surgical History:  Past Surgical History:  Procedure Laterality Date   BACK SURGERY  04/2019   CARDIAC CATHETERIZATION N/A 06/10/2015   Procedure: Left Heart Cath;  Surgeon: Iran Ouch, MD;   Location: [facility] INVASIVE CV LAB;  Service: Cardiovascular;  Laterality: N/A;   CORONARY STENT INTERVENTION N/A 10/08/2019   Procedure: CORONARY STENT INTERVENTION;  Surgeon: Iran Ouch, MD;  Location: [facility] INVASIVE CV LAB;  Service: Cardiovascular;  Laterality: N/A;   CORONARY STENT PLACEMENT     LEFT HEART CATH AND CORONARY ANGIOGRAPHY N/A 10/08/2019   Procedure: LEFT HEART CATH AND CORONARY ANGIOGRAPHY poss pci;  Surgeon: Iran Ouch, MD;  Location: [facility] INVASIVE CV LAB;  Service: Cardiovascular;  Laterality: N/A;   RIGHT HEART CATH AND CORONARY ANGIOGRAPHY N/A 12/30/2021   Procedure: RIGHT HEART CATH AND CORONARY ANGIOGRAPHY;  Surgeon: Yvonne Kendall, MD;  Location: [facility] INVASIVE CV LAB;  Service: Cardiovascular;  Laterality: N/A;   RIGHT/LEFT HEART CATH AND CORONARY ANGIOGRAPHY N/A 08/20/2019   Procedure: RIGHT/LEFT HEART CATH AND CORONARY ANGIOGRAPHY;  Surgeon: Iran Ouch, MD;  Location: [facility] INVASIVE CV LAB;  Service: Cardiovascular;  Laterality: N/A;   HPI:  Per H&P "[name]. is a 66 y.o. male with medical history significant of coronary artery disease, type 2 diabetes mellitus, hypertension, HFrEF(EF of 30 to 35%) dilated cardiomyopathy and dyslipidemia came to ED with concern of chest pain which woke him up from sleep.  It felt more like pressure, radiating to the left arm and associated with shortness of breath and nausea.  Per patient it feels similar to  his prior heart attack.     Patient was also having headache and complaining about flashing lights and blurry vision.  Flashing light and blurry vision resolves.  Patient will follow-up with ophthalmology as an outpatient.     Per patient he had orthopnea and PND for a long time, recently worsening exertional dyspnea and orthopnea for the past couple of weeks.  Patient was not taking his medications very regularly and only takes it as as needed.     Patient denies any recent illnesses, upper respiratory symptoms, fever  or chills.  No recent change in his appetite or bowel habits.  No recent lower extremity edema.  No urinary symptoms.     Patient denies smoking, alcohol or substance use."  12/26/21 MRI/MRA "1. Small area of acute infarction left frontal white matter. Moderate chronic microvascular ischemic change in the white matter. Multiple areas of chronic microhemorrhage in the brain correlate with hypertension history 2. Negative MRA head 3. Negative MRA neck"   Assessment / Plan / Recommendation  Clinical Impression  Pt seen for clinical swallowing evaluation. Pt alert. On room air. Denies dysphagia during this admission or PTA. Pt reports consuming a regular diet PTA. Cleared with RN. RN denied pt with any s/sx dysphagia.  Oral motor examination completed and unremarkable with exception of a few missing molars.   Per chart review, temp and WBC elevated. CXR, 12/31/21, "No active disease."  Pt given trials of solid (declined despite encouragement/education; "I'm not eating that"), pureed (x2 bites; pt limited), and thin liquids (3 oz water challenge). With consistencies observed oral phase was [facility] and pharyngeal phase appeared [facility]. No overt or subtle s/sx pharyngeal dysphagia. Seemingly timely swallow initiation.   Based on pt and RN report as well as SLP observation, recommend continuation of a regular diet with thin liquids with upright positioning for POs.   Per screening, pt A&Ox4 and speech is fluent, appropriate, and without s/sx anomia or dysarthria.   SLP to sign off as pt has no acute SLP needs at this time.  Pt and RN made aware of results, recommendations, and SLP POC. Pt verbalized understanding/agreement.  SLP Visit Diagnosis: Dysphagia, unspecified (R13.10)    Aspiration Risk  No limitations    Diet Recommendation Regular;Thin liquid   Medication Administration:  (as tolerated) Supervision: Patient able to self feed Postural Changes: Seated upright at 90 degrees    Other   Recommendations Oral Care Recommendations: Oral care BID;Patient independent with oral care (set up)    Recommendations for follow up therapy are one component of a multi-disciplinary discharge planning process, led by the attending physician.  Recommendations may be updated based on patient status, additional functional criteria and insurance authorization.  Follow up Recommendations No SLP follow up      Assistance Recommended at Discharge  (defer to OT/PT)  Functional Status Assessment Patient has not had a recent decline in their functional status         Prognosis Prognosis for Safe Diet Advancement: Good      Swallow Study   General Date of Onset: 12/23/21 (admission date) HPI: Per H&P "Kriday Semper. is a 66 y.o. male with medical history significant of coronary artery disease, type 2 diabetes mellitus, hypertension, HFrEF(EF of 30 to 35%) dilated cardiomyopathy and dyslipidemia came to ED with concern of chest pain which woke him up from sleep.  It felt more like pressure, radiating to the left arm and associated with shortness of breath and nausea.  Per  patient it feels similar to his prior heart attack.     Patient was also having headache and complaining about flashing lights and blurry vision.  Flashing light and blurry vision resolves.  Patient will follow-up with ophthalmology as an outpatient.     Per patient he had orthopnea and PND for a long time, recently worsening exertional dyspnea and orthopnea for the past couple of weeks.  Patient was not taking his medications very regularly and only takes it as as needed.     Patient denies any recent illnesses, upper respiratory symptoms, fever or chills.  No recent change in his appetite or bowel habits.  No recent lower extremity edema.  No urinary symptoms.     Patient denies smoking, alcohol or substance use." Type of Study: Bedside Swallow Evaluation Previous Swallow Assessment: unknown Diet Prior to this Study:  Regular Temperature Spikes Noted: No Respiratory Status: Room air History of Recent Intubation: No Behavior/Cognition: Alert Oral Cavity Assessment: Within Functional Limits Oral Care Completed by SLP: Recent completion by staff Oral Cavity - Dentition: Adequate natural dentition (few missing molars) Vision: Functional for self-feeding Self-Feeding Abilities: Able to feed self Patient Positioning: Upright in bed Baseline Vocal Quality: Normal Volitional Cough: Strong Volitional Swallow: Able to elicit    Oral/Motor/Sensory Function Overall Oral Motor/Sensory Function: Within functional limits   Ice Chips Ice chips: Not tested   Thin Liquid Thin Liquid: Within functional limits Presentation: Straw Other Comments: 3 oz water challenge    Nectar Thick Nectar Thick Liquid: Not tested   Honey Thick Honey Thick Liquid: Not tested   Puree Puree: Within functional limits Presentation: Self Fed Other Comments: x2 bites   Solid     Solid: Not tested (pt declined solid trials; "I'm not eating that"; pt denies difficulty consuming regular solids PTA or during this admission)     Clyde Canterbury, M.S., CCC-SLP Speech-Language Pathologist [facility] [phone] (ASCOM)   Woodroe Chen 12/31/2021,11:09 AM

## 2021-12-31 NOTE — Progress Notes (Signed)
SATURATION QUALIFICATIONS: (This note is used to comply with regulatory documentation for home oxygen)  Patient Saturations on Room Air at Rest = 97%  Patient Saturations on Room Air while Ambulating = 92%    Please briefly explain why patient needs home oxygen:  Pt does not qualify for home O2. During ambulation pt did c/o SOB and dizziness. Pt stated he did not need to stop or take a rest.

## 2021-12-31 NOTE — Progress Notes (Signed)
Progress Note  Patient Name: [name]. Date of Encounter: 12/31/2021  [facility] Cardiologist: Lorine Bears, MD   Subjective   Denies chest pain, still endorses shortness of breath.  Left heart cath performed yesterday, patent stents in LAD and left circumflex, CTO RCA.    Inpatient Medications    Scheduled Meds:  aspirin EC  81 mg Oral Daily   carvedilol  3.125 mg Oral BID WC   clopidogrel  75 mg Oral Daily   enoxaparin (LOVENOX) injection  0.5 mg/kg Subcutaneous Q24H   ezetimibe  10 mg Oral Daily   furosemide  40 mg Oral Daily   insulin aspart  0-15 Units Subcutaneous TID WC   insulin aspart  0-5 Units Subcutaneous QHS   insulin aspart  6 Units Subcutaneous TID WC   insulin glargine-yfgn  22 Units Subcutaneous QHS   isosorbide mononitrate  15 mg Oral Daily   mometasone-formoterol  2 puff Inhalation BID   ranolazine  500 mg Oral BID   sacubitril-valsartan  1 tablet Oral BID   sodium chloride flush  3 mL Intravenous Q12H   spironolactone  12.5 mg Oral Daily   Continuous Infusions:  sodium chloride     PRN Meds: sodium chloride, acetaminophen **OR** acetaminophen, albuterol, nitroGLYCERIN, ondansetron **OR** ondansetron (ZOFRAN) IV, polyethylene glycol, sodium chloride flush   Vital Signs    Vitals:   12/30/21 2345 12/31/21 0436 12/31/21 0736 12/31/21 1145  BP: 140/74 132/85 123/71 102/66  Pulse: 62 (!) 59 (!) 57 62  Resp: 20 19 18 16   Temp: 97.6 F (36.4 C) 97.6 F (36.4 C) 97.6 F (36.4 C) 97.7 F (36.5 C)  TempSrc: Oral Oral Oral Oral  SpO2: 94% 95% 96% 99%  Weight:  94.2 kg    Height:        Intake/Output Summary (Last 24 hours) at 12/31/2021 1306 Last data filed at 12/31/2021 0000 Gross per 24 hour  Intake 505 ml  Output --  Net 505 ml      12/31/2021    4:36 AM 12/30/2021    5:41 AM 12/29/2021   12:11 PM  Last 3 Weights  Weight (lbs) 207 lb 10.8 oz 208 lb 5.4 oz 216 lb 7.9 oz  Weight (kg) 94.2 kg 94.5 kg 98.2 kg      Telemetry     Currently off telemetry- Personally Reviewed  ECG     - Personally Reviewed  Physical Exam   GEN: No acute distress.   Neck: No JVD Cardiac: RRR  Respiratory: Decreased breath sounds bilaterally, no wheezing GI: Soft, nontender, non-distended  MS: No edema; No deformity. Neuro:  Nonfocal  Psych: Normal affect   Labs    High Sensitivity Troponin:   Recent Labs  Lab 12/23/21 0957 12/23/21 1235 12/23/21 1600 12/23/21 2004  TROPONINIHS 30* 21* 22* 32*     Chemistry Recent Labs  Lab 12/29/21 0616 12/30/21 0507 12/31/21 0430  NA 138 136 137  K 3.7 4.8 4.1  CL 106 105 106  CO2 24 22 26   GLUCOSE 227* 336* 183*  BUN 36* 34* 34*  CREATININE 1.14 1.09 1.10  CALCIUM 9.2 9.7 8.6*  GFRNONAA >60 >60 >60  ANIONGAP 8 9 5     Lipids No results for input(s): "CHOL", "TRIG", "HDL", "LABVLDL", "LDLCALC", "CHOLHDL" in the last 168 hours.  Hematology Recent Labs  Lab 12/27/21 0621 12/29/21 0616 12/31/21 0430  WBC 11.1* 11.5* 17.6*  RBC 5.30 5.46 5.25  HGB 15.7 16.3 15.6  HCT 46.4  47.7 45.7  MCV 87.5 87.4 87.0  MCH 29.6 29.9 29.7  MCHC 33.8 34.2 34.1  RDW 14.3 14.1 13.6  PLT 232 231 233   Thyroid No results for input(s): "TSH", "FREET4" in the last 168 hours.  BNPNo results for input(s): "BNP", "PROBNP" in the last 168 hours.  DDimer  Recent Labs  Lab 12/30/21 1607  DDIMER 0.35     Radiology    DG Chest Port 1 View  Result Date: 12/30/2021 CLINICAL DATA:  Shortness of breath EXAM: PORTABLE CHEST 1 VIEW COMPARISON:  Chest x-ray dated December 23, 2021 FINDINGS: Cardiac and mediastinal contours are unchanged. Lungs are clear. No large pleural effusion or pneumothorax. IMPRESSION: No active disease. Electronically Signed   By: Allegra Lai [name]   On: 12/30/2021 17:13   CARDIAC CATHETERIZATION  Result Date: 12/30/2021 Conclusions: Severe multivessel coronary artery disease, overall relatively similar to most recent cath in 2021.  Mid LCx stent placed at that  time demonstrates mild in-stent restenosis in the proximal segment.  Eccentric ostial OM2 lesion appears similar. Mildly elevated left heart and pulmonary artery pressures. Mildly reduced Fick cardiac output/index. Recommendations: Continue indefinite DAPT with aspirin and clopidogrel. Continue gentle diuresis; I will add back furosemide 40 mg daily.  Escalate GDMT for HFrEF, as blood pressure and renal function allow. Add ranolazine 500 mg BID for antianginal therapy.  EKG to be obtained tomorrow AM to reassess QT interval. Consider pulmonary consultation, as degree of dyspnea seems to be out of proportion to heart failure/coronary artery disease. If the patient has refractory symptoms in spite of aforementioned interventions, PCI to OM2 may need to be considered.  If possible, this should be deferred for at least 2 weeks from the time of recent brain MRI demonstrating acute stroke in order to minimize risk for periprocedural intracranial hemorrhage. Aggressive secondary prevention of coronary artery disease. Yvonne Kendall, MD [facility]   Cardiac Studies   Echo EF 30 to 35% Left heart cath 12/30/2021 patent stents in LAD and left circumflex, CTO RCA  Patient Profile     66 y.o. male with history of CAD/PCI (pci to LAD, diagonal, RCA,Lcx,  CTO RCA), ischemic cardiomyopathy EF 30 to 35%, former smoker x20+ years presenting with dizziness, headache, worsening shortness of breath and chest tightness.  Being seen for CAD, CHF.  Assessment & Plan    ischemic cardiomyopathy EF 30 to 35% -Appears euvolemic -Could be combination of reduced EF,?  COPD -Consider pulmonary etiology of/management for shortness of breath -Low-dose Entresto restarted today due to low normal BP -Continue Coreg, Aldactone, Lasix 40 mg daily  2.  CAD/PCI -Left heart cath yesterday with patent stents in LAD, diagonal, left circumflex -Continue aspirin, Plavix, Imdur. -Denies chest pain, endorses shortness of breath  3.   Shortness of breath, history of smoking -Diminished breath sounds on exam, ? emphysema -Consider high-resolution chest CT,  -inhalers, management as per pulmonary medicine  Additional testing planned from a cardiac perspective.  Continue current medications. Total encounter time more than 50 minutes  Greater than 50% was spent in counseling and coordination of care with the patient      Signed, Debbe Odea, MD  12/31/2021, 1:06 PM

## 2022-01-10 ENCOUNTER — Other Ambulatory Visit: Payer: Self-pay

## 2022-01-10 ENCOUNTER — Inpatient Hospital Stay
Admission: EM | Admit: 2022-01-10 | Discharge: 2022-01-12 | DRG: 309 | Disposition: A | Discharge status: Home / Self Care | Payer: Medicaid Other | Attending: Internal Medicine | Admitting: Internal Medicine

## 2022-01-10 DIAGNOSIS — Z8673 Personal history of transient ischemic attack (TIA), and cerebral infarction without residual deficits: Secondary | ICD-10-CM

## 2022-01-10 DIAGNOSIS — I255 Ischemic cardiomyopathy: Secondary | ICD-10-CM | POA: Diagnosis present

## 2022-01-10 DIAGNOSIS — N179 Acute kidney failure, unspecified: Secondary | ICD-10-CM | POA: Diagnosis present

## 2022-01-10 DIAGNOSIS — I2489 Other forms of acute ischemic heart disease: Secondary | ICD-10-CM

## 2022-01-10 DIAGNOSIS — E1165 Type 2 diabetes mellitus with hyperglycemia: Secondary | ICD-10-CM | POA: Diagnosis present

## 2022-01-10 DIAGNOSIS — G4730 Sleep apnea, unspecified: Secondary | ICD-10-CM | POA: Diagnosis present

## 2022-01-10 DIAGNOSIS — Z87891 Personal history of nicotine dependence: Secondary | ICD-10-CM

## 2022-01-10 DIAGNOSIS — E669 Obesity, unspecified: Secondary | ICD-10-CM | POA: Diagnosis present

## 2022-01-10 DIAGNOSIS — J9611 Chronic respiratory failure with hypoxia: Secondary | ICD-10-CM | POA: Diagnosis present

## 2022-01-10 DIAGNOSIS — Z7982 Long term (current) use of aspirin: Secondary | ICD-10-CM

## 2022-01-10 DIAGNOSIS — I2510 Atherosclerotic heart disease of native coronary artery without angina pectoris: Secondary | ICD-10-CM | POA: Diagnosis present

## 2022-01-10 DIAGNOSIS — R778 Other specified abnormalities of plasma proteins: Secondary | ICD-10-CM | POA: Diagnosis not present

## 2022-01-10 DIAGNOSIS — Z955 Presence of coronary angioplasty implant and graft: Secondary | ICD-10-CM | POA: Diagnosis not present

## 2022-01-10 DIAGNOSIS — T63463A Toxic effect of venom of wasps, assault, initial encounter: Secondary | ICD-10-CM | POA: Diagnosis present

## 2022-01-10 DIAGNOSIS — I272 Pulmonary hypertension, unspecified: Secondary | ICD-10-CM | POA: Diagnosis present

## 2022-01-10 DIAGNOSIS — I252 Old myocardial infarction: Secondary | ICD-10-CM | POA: Diagnosis not present

## 2022-01-10 DIAGNOSIS — I472 Ventricular tachycardia, unspecified: Secondary | ICD-10-CM | POA: Diagnosis not present

## 2022-01-10 DIAGNOSIS — I5023 Acute on chronic systolic (congestive) heart failure: Secondary | ICD-10-CM | POA: Diagnosis present

## 2022-01-10 DIAGNOSIS — T782XXA Anaphylactic shock, unspecified, initial encounter: Secondary | ICD-10-CM | POA: Diagnosis present

## 2022-01-10 DIAGNOSIS — I214 Non-ST elevation (NSTEMI) myocardial infarction: Secondary | ICD-10-CM | POA: Diagnosis not present

## 2022-01-10 DIAGNOSIS — E785 Hyperlipidemia, unspecified: Secondary | ICD-10-CM | POA: Diagnosis present

## 2022-01-10 DIAGNOSIS — T380X5A Adverse effect of glucocorticoids and synthetic analogues, initial encounter: Secondary | ICD-10-CM | POA: Diagnosis present

## 2022-01-10 DIAGNOSIS — T63461A Toxic effect of venom of wasps, accidental (unintentional), initial encounter: Secondary | ICD-10-CM | POA: Diagnosis present

## 2022-01-10 DIAGNOSIS — Z7984 Long term (current) use of oral hypoglycemic drugs: Secondary | ICD-10-CM

## 2022-01-10 DIAGNOSIS — I248 Other forms of acute ischemic heart disease: Secondary | ICD-10-CM | POA: Diagnosis present

## 2022-01-10 DIAGNOSIS — E872 Acidosis, unspecified: Secondary | ICD-10-CM | POA: Diagnosis present

## 2022-01-10 DIAGNOSIS — Z79899 Other long term (current) drug therapy: Secondary | ICD-10-CM

## 2022-01-10 DIAGNOSIS — Z6834 Body mass index (BMI) 34.0-34.9, adult: Secondary | ICD-10-CM

## 2022-01-10 DIAGNOSIS — Z8249 Family history of ischemic heart disease and other diseases of the circulatory system: Secondary | ICD-10-CM

## 2022-01-10 DIAGNOSIS — Z7902 Long term (current) use of antithrombotics/antiplatelets: Secondary | ICD-10-CM

## 2022-01-10 DIAGNOSIS — Z833 Family history of diabetes mellitus: Secondary | ICD-10-CM

## 2022-01-10 DIAGNOSIS — I110 Hypertensive heart disease with heart failure: Secondary | ICD-10-CM | POA: Diagnosis present

## 2022-01-10 DIAGNOSIS — R079 Chest pain, unspecified: Secondary | ICD-10-CM | POA: Diagnosis not present

## 2022-01-10 DIAGNOSIS — I5022 Chronic systolic (congestive) heart failure: Secondary | ICD-10-CM | POA: Diagnosis present

## 2022-01-10 DIAGNOSIS — Z91141 Patient's other noncompliance with medication regimen due to financial hardship: Secondary | ICD-10-CM

## 2022-01-10 DIAGNOSIS — I493 Ventricular premature depolarization: Secondary | ICD-10-CM | POA: Diagnosis present

## 2022-01-10 DIAGNOSIS — D72829 Elevated white blood cell count, unspecified: Secondary | ICD-10-CM | POA: Diagnosis present

## 2022-01-10 DIAGNOSIS — I5042 Chronic combined systolic (congestive) and diastolic (congestive) heart failure: Secondary | ICD-10-CM | POA: Diagnosis present

## 2022-01-10 DIAGNOSIS — I10 Essential (primary) hypertension: Secondary | ICD-10-CM | POA: Diagnosis present

## 2022-01-10 LAB — CBC WITH DIFFERENTIAL/PLATELET
Abs Immature Granulocytes: 0.16 10*3/uL — ABNORMAL HIGH (ref 0.00–0.07)
Basophils Absolute: 0.1 10*3/uL (ref 0.0–0.1)
Basophils Relative: 0 %
Eosinophils Absolute: 0.1 10*3/uL (ref 0.0–0.5)
Eosinophils Relative: 1 %
HCT: 47.6 % (ref 39.0–52.0)
Hemoglobin: 16.7 g/dL (ref 13.0–17.0)
Immature Granulocytes: 1 %
Lymphocytes Relative: 13 %
Lymphs Abs: 2.2 10*3/uL (ref 0.7–4.0)
MCH: 29.9 pg (ref 26.0–34.0)
MCHC: 35.1 g/dL (ref 30.0–36.0)
MCV: 85.3 fL (ref 80.0–100.0)
Monocytes Absolute: 1.5 10*3/uL — ABNORMAL HIGH (ref 0.1–1.0)
Monocytes Relative: 8 %
Neutro Abs: 13.7 10*3/uL — ABNORMAL HIGH (ref 1.7–7.7)
Neutrophils Relative %: 77 %
Platelets: 305 10*3/uL (ref 150–400)
RBC: 5.58 MIL/uL (ref 4.22–5.81)
RDW: 13.4 % (ref 11.5–15.5)
WBC: 17.7 10*3/uL — ABNORMAL HIGH (ref 4.0–10.5)
nRBC: 0 % (ref 0.0–0.2)

## 2022-01-10 LAB — BASIC METABOLIC PANEL
Anion gap: 12 (ref 5–15)
BUN: 23 mg/dL (ref 8–23)
CO2: 19 mmol/L — ABNORMAL LOW (ref 22–32)
Calcium: 9.7 mg/dL (ref 8.9–10.3)
Chloride: 111 mmol/L (ref 98–111)
Creatinine, Ser: 1.26 mg/dL — ABNORMAL HIGH (ref 0.61–1.24)
GFR, Estimated: >60 mL/min (ref >60)
Glucose, Bld: 266 mg/dL — ABNORMAL HIGH (ref 70–99)
Potassium: 4.2 mmol/L (ref 3.5–5.1)
Sodium: 142 mmol/L (ref 135–145)

## 2022-01-10 LAB — GLUCOSE, CAPILLARY: Glucose-Capillary: 265 mg/dL — ABNORMAL HIGH (ref 70–99)

## 2022-01-10 LAB — TROPONIN I (HIGH SENSITIVITY): Troponin I (High Sensitivity): 137 ng/L (ref <18)

## 2022-01-10 MED ORDER — STERILE WATER FOR INJECTION IJ SOLN
INTRAMUSCULAR | Status: AC
  Filled 2022-01-10: qty 10

## 2022-01-10 MED ORDER — EPINEPHRINE 0.3 MG/0.3ML IJ SOAJ
0.3000 mg | Freq: Once | INTRAMUSCULAR | Status: AC
  Administered 2022-01-10: 0.3 mg via INTRAMUSCULAR

## 2022-01-10 MED ORDER — DOCUSATE SODIUM 100 MG PO CAPS: 100.0000 mg | ORAL_CAPSULE | Freq: Two times a day (BID) | ORAL | Status: DC | PRN

## 2022-01-10 MED ORDER — AMIODARONE HCL IN DEXTROSE 360-4.14 MG/200ML-% IV SOLN: 60.0000 mg/h | INTRAVENOUS | Status: DC

## 2022-01-10 MED ORDER — AMIODARONE IV BOLUS ONLY 150 MG/100ML
150.0000 mg | Freq: Once | INTRAVENOUS | Status: AC
  Administered 2022-01-10: 150 mg via INTRAVENOUS
  Filled 2022-01-10: qty 100

## 2022-01-10 MED ORDER — AMIODARONE HCL IN DEXTROSE 360-4.14 MG/200ML-% IV SOLN
INTRAVENOUS | Status: AC
  Administered 2022-01-10: 60 mg/h via INTRAVENOUS
  Filled 2022-01-10: qty 200

## 2022-01-10 MED ORDER — AMIODARONE IV BOLUS ONLY 150 MG/100ML
150.0000 mg | Freq: Once | INTRAVENOUS | Status: AC
  Administered 2022-01-10: 150 mg via INTRAVENOUS

## 2022-01-10 MED ORDER — AMIODARONE IV BOLUS ONLY 150 MG/100ML
INTRAVENOUS | Status: AC
  Filled 2022-01-10: qty 100

## 2022-01-10 MED ORDER — POLYETHYLENE GLYCOL 3350 17 G PO PACK: 17.0000 g | PACK | Freq: Every day | ORAL | Status: DC | PRN

## 2022-01-10 MED ORDER — ETOMIDATE 2 MG/ML IV SOLN
INTRAVENOUS | Status: AC
  Filled 2022-01-10: qty 10

## 2022-01-10 MED ORDER — KETOROLAC TROMETHAMINE 30 MG/ML IJ SOLN
30.0000 mg | Freq: Once | INTRAMUSCULAR | Status: AC
  Administered 2022-01-11: 30 mg via INTRAVENOUS
  Filled 2022-01-10: qty 1

## 2022-01-10 MED ORDER — AMIODARONE HCL IN DEXTROSE 360-4.14 MG/200ML-% IV SOLN
30.0000 mg/h | INTRAVENOUS | Status: DC
  Administered 2022-01-11: 30 mg/h via INTRAVENOUS
  Filled 2022-01-10: qty 200

## 2022-01-10 MED ORDER — METHYLPREDNISOLONE SODIUM SUCC 125 MG IJ SOLR
INTRAMUSCULAR | Status: AC
  Administered 2022-01-10: 125 mg
  Filled 2022-01-10: qty 2

## 2022-01-10 MED ORDER — ETOMIDATE 2 MG/ML IV SOLN: 0.1000 mg/kg | Freq: Once | INTRAVENOUS | Status: DC

## 2022-01-10 MED ORDER — INSULIN ASPART 100 UNIT/ML IJ SOLN
0.0000 [IU] | INTRAMUSCULAR | Status: DC
  Administered 2022-01-10 – 2022-01-11 (×2): 11 [IU] via SUBCUTANEOUS
  Administered 2022-01-11: 15 [IU] via SUBCUTANEOUS
  Administered 2022-01-11: 11 [IU] via SUBCUTANEOUS
  Administered 2022-01-11 – 2022-01-12 (×5): 7 [IU] via SUBCUTANEOUS
  Filled 2022-01-10 (×9): qty 1

## 2022-01-10 MED ORDER — FAMOTIDINE IN NACL 20-0.9 MG/50ML-% IV SOLN
INTRAVENOUS | Status: AC
  Administered 2022-01-10: 20 mg
  Filled 2022-01-10: qty 50

## 2022-01-10 MED ORDER — ETOMIDATE 2 MG/ML IV SOLN
0.1000 mg/kg | Freq: Once | INTRAVENOUS | Status: AC
  Administered 2022-01-10: 9.42 mg via INTRAVENOUS

## 2022-01-10 MED ORDER — DIPHENHYDRAMINE HCL 50 MG/ML IJ SOLN
25.0000 mg | Freq: Once | INTRAMUSCULAR | Status: AC
  Administered 2022-01-11: 25 mg via INTRAVENOUS
  Filled 2022-01-10: qty 1

## 2022-01-10 MED ORDER — SODIUM CHLORIDE 0.9 % IV SOLN
Freq: Once | INTRAVENOUS | Status: AC
  Administered 2022-01-10: 1000 mL via INTRAVENOUS

## 2022-01-10 NOTE — ED Notes (Signed)
EPIPEN administered

## 2022-01-10 NOTE — ED Notes (Signed)
Pt cardioverted with 120j at this time, HR ST 104 bpm

## 2022-01-10 NOTE — ED Notes (Signed)
Pt placed in room and on monitor. Pt is in VTACH HR of 175. Crash cart placed at bedside. MD at bedside.

## 2022-01-10 NOTE — H&P (Addendum)
NAME:  Glen Croes., MRN:  [id number], DOB:  Nov 10, 1955, LOS: 0 ADMISSION DATE:  01/10/2022, CONSULTATION DATE:  01/10/2022 REFERRING MD:  Phineas Semen MD  CHIEF COMPLAINT:  Chest pain    HPI  66 y.o with male significant PMH of  CAD s/p multiple PCI's, CTO RCA, Ischemic Cardiomyopathy with EF 30 to 35 %, former smoker x 20 + packs, HTN, HLD, T2DM, and recent CVA who presented to the ED with chief complaints of chest discomfort following wasps bite.  Patient report that  he was doing his normal routine work at home where he was bitten by a flock of wasps over his head, back, and arms. After about 15 min of the wasps bite, he experienced palpitations and chest discomfort which was sudden in onset and persistent. He described them as fast and regular thumping in the chest associated with nausea, vomiting, dizziness and shortness of breath. Patient report feeling sensation of his throat closing with pain in his bilateral arms, back of head and neck.  ED Course: In the emergency department, the temperature was 36.7C,  pulse was 175/min regular, blood pressure of 125/70 mmHg, respiratory rate of 32/min, and oxygen saturation of 98% at room air. He was anxious and restless. His cardiovascular examination was normal except for tachycardia. Initial 12-lead electrocardiogram (ECG) showed runs of sustained wide QRS-complex  ventricular tachycardia at a rate of 175 b.p.m. without detectable P-waves.  Given patient was symptomatic, he was immediately cardioverted at 120 J with brief conversion to normal sinus rhythm.  Patient went back into sustained V. tach and was treated with 150 mg of amiodarone bolus with no improvement therefore he was cardioverted again with 120J.  A repeat bolus of 150 mg amiodarone i.v. was administered, which rapidly terminated VT and restored normal sinus rhythm.  Patient was started on amiodarone drip following the bolus.  Due to concerns for possible anaphylaxis reaction from  wasp bite, patient was treated with epinephrine 0.3 mg, Solu-Medrol 125 mg and 20 mg of IV Pepcid.  PCCM consulted for admission and further management. Pertinent Labs in Red/Diagnostics Findings: Na+/ K+:142/4.2 Glucose: 266 BUN/Cr.:23/1.26   WBC/ TMAX: 17.7/ afebrile Troponin: 137 Lactate:2.1 BNP: pending JEH:[id number]  Past Medical History   Angina    Arthritis    CAD (coronary artery disease)    a. s/p MI with LAD and Diag stenting @ [name];  b. 06/2015 Cath: LAD 61m/d ISR, 100 RCA (ISR) w/ L->R collats, otw mod nonobs dzs-->Med Rx; c. 08/2019 Cath: LM nl, LAD 10p/m ISR, D1 20, D2 100, RI min irregs, LCX 64m/d, OM1 100, OM2 50, RCA 100p, 70d. RPDA fills via collats from LAD. EF 25-35%-->Med Rx; d. 10/2019 NSTEMI/Cath: LCX now 100 (2.75x15 Resolute Onyx DES), otw stable compared to 08/2019.  Chronic combined systolic and diastolic CHF (congestive heart failure) (HCC)    a. 06/2015 Echo: EF 20-25%, Gr3 DD; b. 10/2019 Echo: EF 25-30%, glob HK, sev inf/infapical HK. Mod dil LA.  Dyspnea    Essential hypertension    Headache 12/23/2021  Hyperlipidemia    Hypokalemia    a. 06/2015 in setting of diuresis.  Ischemic cardiomyopathy    a. 2011 EF 45% ([name]);  b. 06/2015 Echo: EF 20-25%; c. 04/2019 Echo: EF 40-45%; d. 08/2019 LV gram: EF 25-35%; e. 10/2019 Echo: EF 25-30%.  PVC's (premature ventricular contractions)    a. 09/2019 [facility]io (3 days): Avg hr 70, 4 runs NSVT, 5 runs SVT, rare PACs, frequent PVCs w/ 11.8% burden.  Sleep apnea    Stroke/Right temporal lobe infarction [facility])    a. 10/2019 MRI brain: 1cm acute ischemic nonhemorrhagic R temporal lobe infarct. Age-related cerebral atrophy w/ moderate chronic small vessel ischemic dzs.  Type 2 diabetes mellitus with hyperglycemia (HCC)    Significant Hospital Events   7/9: Admitted to the ICU with unusual case of sustained ventricular tachycardia following a wasp bite with associated anaphylaxis reaction  Consults:  PCCM  Procedures:  7/9:  Synchronized cardioversion x2  Significant Diagnostic Tests:  7/10: Chest Xray>  Micro Data:  None  Antimicrobials:  None  OBJECTIVE  Blood pressure (!) 148/87, pulse 84, temperature 98 F (36.7 C), temperature source Oral, resp. rate 18, height 5\' 6"  (1.676 m), weight 94.2 kg, SpO2 98 %.        Intake/Output Summary (Last 24 hours) at 01/10/2022 2323 Last data filed at 01/10/2022 2231 Gross per 24 hour  Intake 50 ml  Output --  Net 50 ml   Filed Weights   01/10/22 2134  Weight: 94.2 kg   Physical Examination  GENERAL: 66 year-old critically ill patient lying in the bed with no acute distress.  EYES: Pupils equal, round, reactive to light and accommodation. No scleral icterus. Extraocular muscles intact.  HEENT: Head atraumatic, normocephalic. Oropharynx and nasopharynx clear.  NECK:  Supple, no jugular venous distention. No thyroid enlargement, no tenderness.  LUNGS: Normal breath sounds bilaterally, no wheezing, rales,rhonchi or crepitation. No use of accessory muscles of respiration.  CARDIOVASCULAR: S1, S2 normal. No murmurs, rubs, or gallops.  ABDOMEN: Soft, nontender, nondistended. Bowel sounds present. No organomegaly or mass.  EXTREMITIES: No pedal edema, cyanosis, or clubbing.  NEUROLOGIC: Cranial nerves II through XII are intact.  Muscle strength 5/5 in all extremities. Sensation intact. Gait not checked.  PSYCHIATRIC: The patient is alert and oriented x 3.  SKIN: No obvious rash, lesion, or ulcer.   Labs/imaging that I havepersonally reviewed  (right click and "Reselect all SmartList Selections" daily)     Labs   CBC: Recent Labs  Lab 01/10/22 2131  WBC 17.7*  NEUTROABS 13.7*  HGB 16.7  HCT 47.6  MCV 85.3  PLT 305    Basic Metabolic Panel: Recent Labs  Lab 01/10/22 2131  NA 142  K 4.2  CL 111  CO2 19*  GLUCOSE 266*  BUN 23  CREATININE 1.26*  CALCIUM 9.7   GFR: Estimated Creatinine Clearance: 62.8 mL/min (A) (by C-G formula based on SCr of  1.26 mg/dL (H)). Recent Labs  Lab 01/10/22 2131  WBC 17.7*    Liver Function Tests: No results for input(s): "AST", "ALT", "ALKPHOS", "BILITOT", "PROT", "ALBUMIN" in the last 168 hours. No results for input(s): "LIPASE", "AMYLASE" in the last 168 hours. No results for input(s): "AMMONIA" in the last 168 hours.  ABG No results found for: "PHART", "PCO2ART", "PO2ART", "HCO3", "TCO2", "ACIDBASEDEF", "O2SAT"   Coagulation Profile: No results for input(s): "INR", "PROTIME" in the last 168 hours.  Cardiac Enzymes: No results for input(s): "CKTOTAL", "CKMB", "CKMBINDEX", "TROPONINI" in the last 168 hours.  HbA1C: Hgb A1c MFr Bld  Date/Time Value Ref Range Status  12/23/2021 08:04 PM 7.5 (H) 4.8 - 5.6 % Final    Comment:    (NOTE) Pre diabetes:          5.7%-6.4%  Diabetes:              >6.4%  Glycemic control for   <7.0% adults with diabetes   06/18/2021 04:29 AM 7.4 (H) 4.8 -  5.6 % Final    Comment:    (NOTE)         Prediabetes: 5.7 - 6.4         Diabetes: >6.4         Glycemic control for adults with diabetes: <7.0     CBG: No results for input(s): "GLUCAP" in the last 168 hours.  Review of Systems:   Review of Systems  Constitutional: Negative.  Negative for chills, diaphoresis, fever, malaise/fatigue and weight loss.  HENT: Negative.  Negative for congestion, ear discharge, ear pain, hearing loss, nosebleeds, sinus pain, sore throat and tinnitus.   Eyes: Negative.  Negative for blurred vision, double vision, photophobia and pain.  Respiratory:  Positive for shortness of breath. Negative for cough, hemoptysis, sputum production, wheezing and stridor.   Cardiovascular:  Positive for chest pain and palpitations. Negative for orthopnea, claudication, leg swelling and PND.  Gastrointestinal:  Positive for nausea and vomiting. Negative for abdominal pain, blood in stool, constipation, diarrhea and melena.  Genitourinary: Negative.   Musculoskeletal:  Positive for back  pain and neck pain.  Skin:  Positive for itching.  Neurological:  Positive for dizziness and headaches. Negative for tingling, tremors, sensory change, speech change, focal weakness, seizures, loss of consciousness and weakness.  Endo/Heme/Allergies: Negative.   Psychiatric/Behavioral: Negative.  Negative for depression, memory loss, substance abuse and suicidal ideas. The patient is not nervous/anxious and does not have insomnia.      Past Medical History  He,  has a past medical history of Anginal pain (HCC), Arthritis, CAD (coronary artery disease), Chronic combined systolic and diastolic CHF (congestive heart failure) (HCC), Dyspnea, Essential hypertension, Headache (12/23/2021), Hyperlipidemia, Hypokalemia, Ischemic cardiomyopathy, PVC's (premature ventricular contractions), Sleep apnea, Stroke/Right temporal lobe infarction [facility]), and Type 2 diabetes mellitus with hyperglycemia (HCC) (10/08/2019).   Surgical History    Past Surgical History:  Procedure Laterality Date   BACK SURGERY  04/2019   CARDIAC CATHETERIZATION N/A 06/10/2015   Procedure: Left Heart Cath;  Surgeon: Iran Ouch, MD;  Location: [facility] INVASIVE CV LAB;  Service: Cardiovascular;  Laterality: N/A;   CORONARY STENT INTERVENTION N/A 10/08/2019   Procedure: CORONARY STENT INTERVENTION;  Surgeon: Iran Ouch, MD;  Location: [facility] INVASIVE CV LAB;  Service: Cardiovascular;  Laterality: N/A;   CORONARY STENT PLACEMENT     LEFT HEART CATH AND CORONARY ANGIOGRAPHY N/A 10/08/2019   Procedure: LEFT HEART CATH AND CORONARY ANGIOGRAPHY poss pci;  Surgeon: Iran Ouch, MD;  Location: [facility] INVASIVE CV LAB;  Service: Cardiovascular;  Laterality: N/A;   RIGHT HEART CATH AND CORONARY ANGIOGRAPHY N/A 12/30/2021   Procedure: RIGHT HEART CATH AND CORONARY ANGIOGRAPHY;  Surgeon: Yvonne Kendall, MD;  Location: [facility] INVASIVE CV LAB;  Service: Cardiovascular;  Laterality: N/A;   RIGHT/LEFT HEART CATH AND CORONARY ANGIOGRAPHY N/A  08/20/2019   Procedure: RIGHT/LEFT HEART CATH AND CORONARY ANGIOGRAPHY;  Surgeon: Iran Ouch, MD;  Location: [facility] INVASIVE CV LAB;  Service: Cardiovascular;  Laterality: N/A;     Social History   reports that he quit smoking about 10 years ago. His smoking use included cigarettes. He has never used smokeless tobacco. He reports that he does not drink alcohol and does not use drugs.   Family History   His family history includes Coronary artery disease in his father and mother; Diabetes in his mother.   Allergies Allergies  Allergen Reactions   Contrast Media [Iodinated Contrast Media] Shortness Of Breath   Iohexol Shortness Of Breath  Onset Date: [id number]    Jardiance [Empagliflozin] Other (See Comments)    Fatigue/weakness   Atorvastatin Other (See Comments)    Myalgias    Hydrocodone Itching   Morphine And Related Other (See Comments)    Lost control    Penicillins Other (See Comments)    Unknown reaction Did it involve swelling of the face/tongue/throat, SOB, or low BP? Unknown Did it involve sudden or severe rash/hives, skin peeling, or any reaction on the inside of your mouth or nose? Unknown Did you need to seek medical attention at a hospital or doctor's office? Yes When did it last happen?      Childhood allergy  If all above answers are "NO", may proceed with cephalosporin use.    Rosuvastatin Other (See Comments)    Myalgias      Home Medications  Prior to Admission medications   Medication Sig Start Date End Date Taking? Authorizing Provider  aspirin EC 81 MG tablet Take 1 tablet (81 mg total) by mouth daily. Swallow whole. 01/01/22  Yes Hollice Espy, MD  albuterol (PROVENTIL) (2.5 MG/3ML) 0.083% nebulizer solution Inhale 3 mLs into the lungs every 6 (six) hours as needed for wheezing or shortness of breath. Patient not taking: Reported on 01/10/2022 12/31/21   Hollice Espy, MD  Budeson-Glycopyrrol-Formoterol (BREZTRI AEROSPHERE) 160-9-4.8  MCG/ACT AERO Inhale 2 puffs into the lungs in the morning and at bedtime. Patient not taking: Reported on 01/10/2022 12/30/21   Hollice Espy, MD  carvedilol (COREG) 3.125 MG tablet Take 1 tablet (3.125 mg total) by mouth 2 (two) times daily with a meal. Patient not taking: Reported on 01/10/2022 12/31/21   Hollice Espy, MD  clopidogrel (PLAVIX) 75 MG tablet Take 1 tablet (75 mg total) by mouth once daily. Patient not taking: Reported on 01/10/2022 12/31/21   Hollice Espy, MD  empagliflozin (JARDIANCE) 10 MG TABS tablet Take 10 mg by mouth daily before breakfast. Patient not taking: Reported on 01/10/2022    [provider]  ezetimibe (ZETIA) 10 MG tablet Take 1 tablet (10 mg total) by mouth daily. Patient not taking: Reported on 01/10/2022 01/01/22   Hollice Espy, MD  furosemide (LASIX) 40 MG tablet Take 1 tablet (40 mg total) by mouth once daily as needed. Patient not taking: Reported on 01/10/2022 12/31/21   Hollice Espy, MD  isosorbide mononitrate (IMDUR) 30 MG 24 hr tablet Take (1/2) tablet (15 mg total) by mouth once daily. Patient not taking: Reported on 01/10/2022 12/31/21   Hollice Espy, MD  metFORMIN (GLUCOPHAGE) 500 MG tablet Take 1 tablet (500 mg total) by mouth 2 (two) times daily with a meal. Patient not taking: Reported on 01/10/2022 12/31/21 12/31/22  Hollice Espy, MD  nitroGLYCERIN (NITROSTAT) 0.4 MG SL tablet Place 1 tablet (0.4 mg total) under the tongue every 5 (five) minutes as needed for chest pain. Patient not taking: Reported on 01/10/2022 12/31/21 03/31/22  Hollice Espy, MD  ranolazine (RANEXA) 500 MG 12 hr tablet Take 1 tablet (500 mg total) by mouth 2 (two) times daily. Patient not taking: Reported on 01/10/2022 12/31/21   Hollice Espy, MD  sacubitril-valsartan (ENTRESTO) 24-26 MG Take 1 tablet by mouth 2 (two) times daily. Patient not taking: Reported on 01/10/2022 12/31/21   Hollice Espy, MD  spironolactone (ALDACTONE) 25 MG tablet  Take 0.5 tablets (12.5 mg total) by mouth daily. Patient not taking: Reported on 01/10/2022 01/01/22   Hollice Espy, MD  Scheduled Meds:  insulin aspart  0-20 Units Subcutaneous Q4H   Continuous Infusions:  amiodarone 60 mg/hr (01/11/22 0000)   amiodarone     amiodarone     PRN Meds:.amiodarone, docusate sodium, polyethylene glycol  Active Hospital Problem list     Assessment & Plan:  Sustained V. tach following wasp bite with associated anaphylaxis reaction Elevated Troponin concerns for NSTEMI vs Demand Ischemia in the setting of above PMHx: CAD with multiple PCI's, Left heart cath 12/30/2021 patent stents in LAD and left circumflex, HLD, HTN -Suspect Wasp stings associated cardiac arrhythmias in a patient with preexisting heart disease. S/p synchronized cardioversion x2 and amiodarone bolus x2,  -Received methylprednisolone 125 mg IV, epinephrine 0.3 mg and Pepcid 20 mg IV -Supplemental O2 as needed to maintain O2 saturations 88 to 92% -High risk for intubation -Trend HS Troponin until peaked  -Continue amiodarone drip -Start Heparin gtt -DUAP therapy with aspirin and Plavix -Cardiology consult, discussed with on call Cardiologist Dr. Tenny Craw who agrees with continuing Amiodarone and starting heparin gtt.   Leukocytosis likely reactive in the setting of above No obvious source of infection, chest x-ray and UA pending Meet sepsis criteria though have low suspicion for infectious process -F/u cultures, trend lactic/ PCT -Monitor WBC/ fever curve -Hold antibiotics for now pending cultures -Pressors for MAP goal >65 -Strict I/O's  Chronic Respiratory Failure without hypoxia PMHx: Underlying undiagnosed COPD, OSA, former smoker 20+years -Supplemental O2 as needed to maintain O2 saturations 88 to 92% -Follow intermittent ABG and chest x-ray as needed -As needed bronchodilators   Chronic HFpEF  Ischemic Cardiomyopathy Recent Echocardiogram revealed LVEF 30 to 35%, G1 DD,  mild MR -Appears euvolemic -BNP only 301 -Continuous cardiac monitoring -Maintain MAP greater than 65 -Lasix as blood pressure and renal function permits -Consider restarting Entresto, Coreg, and Aldactone once stable  -Cardiology consult   AKI on CKD,  -Monitor I&O's / urinary output -Follow BMP -Ensure adequate renal perfusion -Avoid nephrotoxic agents as able -Replace electrolytes as indicated  T2DM HgbA1c 7.5 -CBG's AC & hs; Target range of 140 to 180 -SSI -Follow ICU Hypo/Hyperglycemia protocol -Hold Metformin    Best practice:  Diet:  NPO Pain/Anxiety/Delirium protocol (if indicated): No VAP protocol (if indicated): Not indicated DVT prophylaxis: Subcutaneous Heparin GI prophylaxis: H2B Glucose control:  SSI Yes Central venous access:  N/A Arterial line:  N/A Foley:  N/A Mobility:  bed rest  PT consulted: N/A Last date of multidisciplinary goals of care discussion [7/9] Code Status:  full code Disposition: ICU   = Goals of Care = Code Status Order: FULL  Primary Emergency Contact: Thunder,Carvel H, Home Phone: [phone] Wishes to pursue full aggressive treatment and intervention options, including CPR and intubation, but goals of care will be addressed on going with family if that should become necessary.  Critical care time: 45 minutes     Webb Silversmith, DNP, CCRN, FNP-C, AGACNP-BC Acute Care Nurse Practitioner  [facility] Pager: [phone] [place] at [facility]

## 2022-01-10 NOTE — ED Notes (Signed)
Pt arrived to the ED from home for evaluation of Bee stings and chest pains. Pt unable to communicate, unknown if the pt is allergic to bee stings. Pt administered Epi pin, pepcid, and methylprednisone. Upon placing the pt on the cardiac monitor, pt in [place], pt placed on zole, provided called to the room, pt administered etomidate and shocked at 120j, pt converted to NSR and was started on Amiodarone bolus. Pt had another run of vtach and was given another amio bolus and started on an amio drip. Pt tolerating well, currently in NSR, family at the bedside.

## 2022-01-10 NOTE — ED Triage Notes (Signed)
Pt came in through front door. Placed in room 10. Pt has HR of 170.

## 2022-01-10 NOTE — ED Notes (Signed)
Pt placed on [place] for comfort.  

## 2022-01-10 NOTE — ED Notes (Signed)
Shock at 120 J.

## 2022-01-10 NOTE — ED Notes (Signed)
Pt resting in bed, alert at this time. °

## 2022-01-11 ENCOUNTER — Inpatient Hospital Stay: Payer: Medicaid Other

## 2022-01-11 DIAGNOSIS — I255 Ischemic cardiomyopathy: Secondary | ICD-10-CM

## 2022-01-11 DIAGNOSIS — R079 Chest pain, unspecified: Secondary | ICD-10-CM

## 2022-01-11 DIAGNOSIS — R778 Other specified abnormalities of plasma proteins: Secondary | ICD-10-CM

## 2022-01-11 DIAGNOSIS — I472 Ventricular tachycardia, unspecified: Principal | ICD-10-CM

## 2022-01-11 LAB — URINALYSIS, COMPLETE (UACMP) WITH MICROSCOPIC
Bilirubin Urine: NEGATIVE
Glucose, UA: >1000 mg/dL — AB
Hgb urine dipstick: NEGATIVE
Ketones, ur: 15 mg/dL — AB
Leukocytes,Ua: NEGATIVE
Nitrite: NEGATIVE
RBC / HPF: NONE SEEN RBC/hpf (ref 0–5)
Specific Gravity, Urine: >1.03 — ABNORMAL HIGH (ref 1.005–1.030)
Squamous Epithelial / HPF: NONE SEEN (ref 0–5)
pH: 5 (ref 5.0–8.0)

## 2022-01-11 LAB — BASIC METABOLIC PANEL
Anion gap: 6 (ref 5–15)
BUN: 22 mg/dL (ref 8–23)
CO2: 21 mmol/L — ABNORMAL LOW (ref 22–32)
Calcium: 8.9 mg/dL (ref 8.9–10.3)
Chloride: 111 mmol/L (ref 98–111)
Creatinine, Ser: 1.16 mg/dL (ref 0.61–1.24)
GFR, Estimated: >60 mL/min (ref >60)
Glucose, Bld: 250 mg/dL — ABNORMAL HIGH (ref 70–99)
Potassium: 4.6 mmol/L (ref 3.5–5.1)
Sodium: 138 mmol/L (ref 135–145)

## 2022-01-11 LAB — CBC
HCT: 44.7 % (ref 39.0–52.0)
Hemoglobin: 15 g/dL (ref 13.0–17.0)
MCH: 29.7 pg (ref 26.0–34.0)
MCHC: 33.6 g/dL (ref 30.0–36.0)
MCV: 88.5 fL (ref 80.0–100.0)
Platelets: 224 10*3/uL (ref 150–400)
RBC: 5.05 MIL/uL (ref 4.22–5.81)
RDW: 13.8 % (ref 11.5–15.5)
WBC: 16.8 10*3/uL — ABNORMAL HIGH (ref 4.0–10.5)
nRBC: 0 % (ref 0.0–0.2)

## 2022-01-11 LAB — GLUCOSE, CAPILLARY
Glucose-Capillary: 248 mg/dL — ABNORMAL HIGH (ref 70–99)
Glucose-Capillary: 268 mg/dL — ABNORMAL HIGH (ref 70–99)
Glucose-Capillary: 237 mg/dL — ABNORMAL HIGH (ref 70–99)
Glucose-Capillary: 305 mg/dL — ABNORMAL HIGH (ref 70–99)
Glucose-Capillary: 209 mg/dL — ABNORMAL HIGH (ref 70–99)

## 2022-01-11 LAB — LACTIC ACID, PLASMA: Lactic Acid, Venous: 2.1 mmol/L (ref 0.5–1.9)

## 2022-01-11 LAB — URINE DRUG SCREEN, QUALITATIVE (ARMC ONLY)
Amphetamines, Ur Screen: NOT DETECTED
Barbiturates, Ur Screen: NOT DETECTED
Benzodiazepine, Ur Scrn: NOT DETECTED
Cannabinoid 50 Ng, Ur ~~LOC~~: NOT DETECTED
Cocaine Metabolite,Ur ~~LOC~~: NOT DETECTED
MDMA (Ecstasy)Ur Screen: NOT DETECTED
Methadone Scn, Ur: NOT DETECTED
Opiate, Ur Screen: NOT DETECTED
Phencyclidine (PCP) Ur S: NOT DETECTED
Tricyclic, Ur Screen: NOT DETECTED

## 2022-01-11 LAB — HEPARIN LEVEL (UNFRACTIONATED)
Heparin Unfractionated: <0.1 IU/mL — ABNORMAL LOW (ref 0.30–0.70)
Heparin Unfractionated: 0.55 IU/mL (ref 0.30–0.70)

## 2022-01-11 LAB — MAGNESIUM: Magnesium: 2.1 mg/dL (ref 1.7–2.4)

## 2022-01-11 LAB — PROCALCITONIN: Procalcitonin: <0.1 ng/mL

## 2022-01-11 LAB — APTT: aPTT: 27 seconds (ref 24–36)

## 2022-01-11 LAB — PROTIME-INR
INR: 1.1 (ref 0.8–1.2)
Prothrombin Time: 14 seconds (ref 11.4–15.2)

## 2022-01-11 LAB — BRAIN NATRIURETIC PEPTIDE: B Natriuretic Peptide: 301.3 pg/mL — ABNORMAL HIGH (ref 0.0–100.0)

## 2022-01-11 LAB — TROPONIN I (HIGH SENSITIVITY): Troponin I (High Sensitivity): 3544 ng/L (ref <18)

## 2022-01-11 LAB — MRSA NEXT GEN BY PCR, NASAL: MRSA by PCR Next Gen: NOT DETECTED

## 2022-01-11 MED ORDER — HEPARIN (PORCINE) 25000 UT/250ML-% IV SOLN
1600.0000 [IU]/h | INTRAVENOUS | Status: DC
  Administered 2022-01-11: 1600 [IU]/h via INTRAVENOUS
  Administered 2022-01-11: 1100 [IU]/h via INTRAVENOUS
  Filled 2022-01-11 (×2): qty 250

## 2022-01-11 MED ORDER — METHYLPREDNISOLONE SODIUM SUCC 40 MG IJ SOLR
40.0000 mg | Freq: Two times a day (BID) | INTRAMUSCULAR | Status: DC
  Administered 2022-01-12: 40 mg via INTRAVENOUS
  Filled 2022-01-11: qty 1

## 2022-01-11 MED ORDER — AMIODARONE HCL 200 MG PO TABS
400.0000 mg | ORAL_TABLET | Freq: Two times a day (BID) | ORAL | Status: DC
  Administered 2022-01-11 – 2022-01-12 (×3): 400 mg via ORAL
  Filled 2022-01-11 (×3): qty 2

## 2022-01-11 MED ORDER — FENTANYL CITRATE PF 50 MCG/ML IJ SOSY
50.0000 ug | PREFILLED_SYRINGE | Freq: Once | INTRAMUSCULAR | Status: AC
  Administered 2022-01-11: 50 ug via INTRAVENOUS
  Filled 2022-01-11: qty 1

## 2022-01-11 MED ORDER — ONDANSETRON HCL 4 MG/2ML IJ SOLN: 4.0000 mg | Freq: Four times a day (QID) | INTRAMUSCULAR | Status: DC | PRN

## 2022-01-11 MED ORDER — HEPARIN BOLUS VIA INFUSION
2500.0000 [IU] | Freq: Once | INTRAVENOUS | Status: AC
  Administered 2022-01-11: 2500 [IU] via INTRAVENOUS
  Filled 2022-01-11: qty 2500

## 2022-01-11 MED ORDER — CLOPIDOGREL BISULFATE 75 MG PO TABS
75.0000 mg | ORAL_TABLET | Freq: Every day | ORAL | Status: DC
  Administered 2022-01-11 – 2022-01-12 (×2): 75 mg via ORAL
  Filled 2022-01-11 (×2): qty 1

## 2022-01-11 MED ORDER — CARVEDILOL 3.125 MG PO TABS
3.1250 mg | ORAL_TABLET | Freq: Two times a day (BID) | ORAL | Status: DC
  Administered 2022-01-12: 3.125 mg via ORAL
  Filled 2022-01-11 (×2): qty 1

## 2022-01-11 MED ORDER — LABETALOL HCL 5 MG/ML IV SOLN: 10.0000 mg | INTRAVENOUS | Status: DC | PRN

## 2022-01-11 MED ORDER — ASPIRIN 81 MG PO TBEC
81.0000 mg | DELAYED_RELEASE_TABLET | Freq: Every day | ORAL | Status: DC
  Administered 2022-01-11 – 2022-01-12 (×2): 81 mg via ORAL
  Filled 2022-01-11 (×2): qty 1

## 2022-01-11 NOTE — Progress Notes (Signed)
CRITICAL CARE PROGRESS NOTE    Name: [name]. MRN: [id number] DOB: 1955-10-19     LOS: 1   SUBJECTIVE FINDINGS & SIGNIFICANT EVENTS    Patient description:   66 y.o with male significant PMH of  CAD s/p multiple PCI's, CTO RCA, Ischemic Cardiomyopathy with EF 30 to 35 %, former smoker x 20 + packs, HTN, HLD, T2DM, and recent CVA who presented to the ED with chief complaints of chest discomfort following wasps bite.     Patient report that  he was doing his normal routine work at home where he was bitten by a flock of wasps over his head, back, and arms. After about 15 min of the wasps bite, he experienced palpitations and chest discomfort which was sudden in onset and persistent. He described them as fast and regular thumping in the chest associated with nausea, vomiting, dizziness and shortness of breath. Patient report feeling sensation of his throat closing with pain in his bilateral arms, back of head and neck.  Lines/tubes :   Microbiology/Sepsis markers: Results for orders placed or performed during the hospital encounter of 01/10/22  MRSA Next Gen by PCR, Nasal     Status: None   Collection Time: 01/10/22 11:55 PM   Specimen: Nasal Mucosa; Nasal Swab  Result Value Ref Range Status   MRSA by PCR Next Gen NOT DETECTED NOT DETECTED Final    Comment: (NOTE) The GeneXpert MRSA Assay (FDA approved for NASAL specimens only), is one component of a comprehensive MRSA colonization surveillance program. It is not intended to diagnose MRSA infection nor to guide or monitor treatment for MRSA infections. Test performance is not FDA approved in patients less than 32 years old. Performed at [facility], [address] [place], [place] [address]   Culture, blood (Routine X 2) w Reflex to ID  Panel     Status: None (Preliminary result)   Collection Time: 01/11/22  1:59 AM   Specimen: BLOOD  Result Value Ref Range Status   Specimen Description BLOOD LEFT ASSIST CONTROL  Final   Special Requests   Final    BOTTLES DRAWN AEROBIC AND ANAEROBIC Blood Culture adequate volume   Culture   Final    NO GROWTH < 12 HOURS Performed at [facility], [address], [place], [place] [address]    Report Status PENDING  Incomplete  Culture, blood (Routine X 2) w Reflex to ID Panel     Status: None (Preliminary result)   Collection Time: 01/11/22  2:08 AM   Specimen: BLOOD  Result Value Ref Range Status   Specimen Description BLOOD LEFT FOREARM  Final   Special Requests   Final    BOTTLES DRAWN AEROBIC AND ANAEROBIC Blood Culture adequate volume   Culture   Final    NO GROWTH < 12 HOURS Performed at [facility], [address] [place], [place] [address]    Report Status PENDING  Incomplete    Anti-infectives:  Anti-infectives (From admission, onward)    None        Consults: Treatment Team:  Iran Ouch, MD      PAST MEDICAL HISTORY   Past Medical History:  Diagnosis Date   Anginal pain (HCC)    Arthritis    CAD (coronary artery disease)    a. s/p MI with LAD and Diag stenting @ [name];  b. 06/2015 Cath: LAD 23m/d ISR, 100 RCA (ISR) w/ L->R collats, otw mod nonobs dzs-->Med Rx;  c. 08/2019 Cath: LM nl, LAD 10p/m ISR, D1 20, D2 100, RI min irregs, LCX 64m/d, OM1 100, OM2 50, RCA 100p, 70d. RPDA fills via collats from LAD. EF 25-35%-->Med Rx; d. 10/2019 NSTEMI/Cath: LCX now 100 (2.75x15 Resolute Onyx DES), otw stable compared to 08/2019.   Chronic combined systolic and diastolic CHF (congestive heart failure) (HCC)    a. 06/2015 Echo: EF 20-25%, Gr3 DD; b. 10/2019 Echo: EF 25-30%, glob HK, sev inf/infapical HK. Mod dil LA.   Dyspnea    Essential hypertension    Headache 12/23/2021   Hyperlipidemia    Hypokalemia    a. 06/2015 in setting of  diuresis.   Ischemic cardiomyopathy    a. 2011 EF 45% ([name]);  b. 06/2015 Echo: EF 20-25%; c. 04/2019 Echo: EF 40-45%; d. 08/2019 LV gram: EF 25-35%; e. 10/2019 Echo: EF 25-30%.   PVC's (premature ventricular contractions)    a. 09/2019 Zio (3 days): Avg hr 70, 4 runs NSVT, 5 runs SVT, rare PACs, frequent PVCs w/ 11.8% burden.   Sleep apnea    Stroke/Right temporal lobe infarction [facility])    a. 10/2019 MRI brain: 1cm acute ischemic nonhemorrhagic R temporal lobe infarct. Age-related cerebral atrophy w/ moderate chronic small vessel ischemic dzs.   Type 2 diabetes mellitus with hyperglycemia (HCC) 10/08/2019     SURGICAL HISTORY   Past Surgical History:  Procedure Laterality Date   BACK SURGERY  04/2019   CARDIAC CATHETERIZATION N/A 06/10/2015   Procedure: Left Heart Cath;  Surgeon: Iran Ouch, MD;  Location: [facility] INVASIVE CV LAB;  Service: Cardiovascular;  Laterality: N/A;   CORONARY STENT INTERVENTION N/A 10/08/2019   Procedure: CORONARY STENT INTERVENTION;  Surgeon: Iran Ouch, MD;  Location: [facility] INVASIVE CV LAB;  Service: Cardiovascular;  Laterality: N/A;   CORONARY STENT PLACEMENT     LEFT HEART CATH AND CORONARY ANGIOGRAPHY N/A 10/08/2019   Procedure: LEFT HEART CATH AND CORONARY ANGIOGRAPHY poss pci;  Surgeon: Iran Ouch, MD;  Location: [facility] INVASIVE CV LAB;  Service: Cardiovascular;  Laterality: N/A;   RIGHT HEART CATH AND CORONARY ANGIOGRAPHY N/A 12/30/2021   Procedure: RIGHT HEART CATH AND CORONARY ANGIOGRAPHY;  Surgeon: Yvonne Kendall, MD;  Location: [facility] INVASIVE CV LAB;  Service: Cardiovascular;  Laterality: N/A;   RIGHT/LEFT HEART CATH AND CORONARY ANGIOGRAPHY N/A 08/20/2019   Procedure: RIGHT/LEFT HEART CATH AND CORONARY ANGIOGRAPHY;  Surgeon: Iran Ouch, MD;  Location: [facility] INVASIVE CV LAB;  Service: Cardiovascular;  Laterality: N/A;     FAMILY HISTORY   Family History  Problem Relation Age of Onset   Coronary artery disease Mother    Diabetes Mother     Coronary artery disease Father      SOCIAL HISTORY   Social History   Tobacco Use   Smoking status: Former    Types: Cigarettes    Quit date: 06/10/2011    Years since quitting: 10.5   Smokeless tobacco: Never  Substance Use Topics   Alcohol use: No    Alcohol/week: 0.0 standard drinks of alcohol   Drug use: No     MEDICATIONS   Current Medication:  Current Facility-Administered Medications:    amiodarone (PACERONE) tablet 400 mg, 400 mg, Oral, BID, Arida, Muhammad A, MD, 400 mg at 01/11/22 1356   aspirin EC tablet 81 mg, 81 mg, Oral, Daily, Arida, Muhammad A, MD, 81 mg at 01/11/22 1356   carvedilol (COREG) tablet 3.125 mg, 3.125 mg, Oral, BID WC, Arida, Muhammad A, MD   clopidogrel (PLAVIX)  tablet 75 mg, 75 mg, Oral, Daily, Kirke Corin, Muhammad A, MD, 75 mg at 01/11/22 1356   docusate sodium (COLACE) capsule 100 mg, 100 mg, Oral, BID PRN, Jimmye Norman, NP   heparin ADULT infusion 100 units/mL (25000 units/234mL), 1,600 Units/hr, Intravenous, Continuous, Lowella Bandy, RPH, Last Rate: 16 mL/hr at 01/11/22 1500, 1,600 Units/hr at 01/11/22 1500   insulin aspart (novoLOG) injection 0-20 Units, 0-20 Units, Subcutaneous, Q4H, Ouma, Hubbard Hartshorn, NP, 11 Units at 01/11/22 1250   labetalol (NORMODYNE) injection 10 mg, 10 mg, Intravenous, Q2H PRN, Jimmye Norman, NP   [START ON 01/12/2022] methylPREDNISolone sodium succinate (SOLU-MEDROL) 40 mg/mL injection 40 mg, 40 mg, Intravenous, Q12H, Ouma, Hubbard Hartshorn, NP   ondansetron (ZOFRAN) injection 4 mg, 4 mg, Intravenous, Q6H PRN, Anna Genre, Hubbard Hartshorn, NP   polyethylene glycol (MIRALAX / GLYCOLAX) packet 17 g, 17 g, Oral, Daily PRN, Jimmye Norman, NP    ALLERGIES   Contrast media [iodinated contrast media], Iohexol, Jardiance [empagliflozin], Atorvastatin, Hydrocodone, Morphine and related, Penicillins, and Rosuvastatin    REVIEW OF SYSTEMS   10 point ROS done and is negative except as per  HPI  PHYSICAL EXAMINATION   Vital Signs: Temp:  [98 F (36.7 C)-98.5 F (36.9 C)] 98.3 F (36.8 C) (07/10 1200) Pulse Rate:  [45-175] 57 (07/10 1500) Resp:  [14-35] 24 (07/10 1500) BP: (110-188)/(65-132) 134/87 (07/10 1500) SpO2:  [92 %-100 %] 99 % (07/10 1500) Weight:  [94.2 kg-95.2 kg] 95.2 kg (07/09 2338)  GENERAL:age appropriate HEAD: Normocephalic, atraumatic.  EYES: Pupils equal, round, reactive to light.  No scleral icterus.  MOUTH: Moist mucosal membrane. NECK: Supple. No thyromegaly. No nodules. No JVD.  PULMONARY: mild rhonchi  CARDIOVASCULAR: S1 and S2. Regular rate and rhythm. No murmurs, rubs, or gallops.  GASTROINTESTINAL: Soft, nontender, non-distended. No masses. Positive bowel sounds. No hepatosplenomegaly.  MUSCULOSKELETAL: No swelling, clubbing, or edema.  NEUROLOGIC: Mild distress due to acute illness SKIN:intact,warm,dry   PERTINENT DATA     Infusions:  heparin 1,600 Units/hr (01/11/22 1500)   Scheduled Medications:  amiodarone  400 mg Oral BID   aspirin EC  81 mg Oral Daily   carvedilol  3.125 mg Oral BID WC   clopidogrel  75 mg Oral Daily   insulin aspart  0-20 Units Subcutaneous Q4H   [START ON 01/12/2022] methylPREDNISolone (SOLU-MEDROL) injection  40 mg Intravenous Q12H   PRN Medications: docusate sodium, labetalol, ondansetron (ZOFRAN) IV, polyethylene glycol Hemodynamic parameters:   Intake/Output: 07/09 0701 - 07/10 0700 In: 1536.3 [I.V.:1486.3; IV Piggyback:50] Out: 0   Ventilator  Settings:    LAB RESULTS:  Basic Metabolic Panel: Recent Labs  Lab 01/10/22 2131 01/11/22 0208  NA 142 138  K 4.2 4.6  CL 111 111  CO2 19* 21*  GLUCOSE 266* 250*  BUN 23 22  CREATININE 1.26* 1.16  CALCIUM 9.7 8.9  MG  --  2.1   Liver Function Tests: No results for input(s): "AST", "ALT", "ALKPHOS", "BILITOT", "PROT", "ALBUMIN" in the last 168 hours. No results for input(s): "LIPASE", "AMYLASE" in the last 168 hours. No results for  input(s): "AMMONIA" in the last 168 hours. CBC: Recent Labs  Lab 01/10/22 2131 01/11/22 0208  WBC 17.7* 16.8*  NEUTROABS 13.7*  --   HGB 16.7 15.0  HCT 47.6 44.7  MCV 85.3 88.5  PLT 305 224   Cardiac Enzymes: No results for input(s): "CKTOTAL", "CKMB", "CKMBINDEX", "TROPONINI" in the last 168 hours. BNP: Invalid input(s): "POCBNP" CBG: Recent Labs  Lab 01/10/22  2333 01/11/22 0315 01/11/22 0719 01/11/22 1125 01/11/22 1551  GLUCAP 265* 248* 268* 237* 305*       IMAGING RESULTS:  Imaging: DG Chest 1 View  Result Date: 01/11/2022 CLINICAL DATA:  Show shortness of breath EXAM: CHEST  1 VIEW COMPARISON:  12/30/2021 FINDINGS: Heart and mediastinal contours are within normal limits. No focal opacities or effusions. No acute bony abnormality. IMPRESSION: No active disease. Electronically Signed   By: Charlett Nose [name]   On: 01/11/2022 00:34   @PROBHOSP @ DG Chest 1 View  Result Date: 01/11/2022 CLINICAL DATA:  Show shortness of breath EXAM: CHEST  1 VIEW COMPARISON:  12/30/2021 FINDINGS: Heart and mediastinal contours are within normal limits. No focal opacities or effusions. No acute bony abnormality. IMPRESSION: No active disease. Electronically Signed   By: 01/01/2022 [name]   On: 01/11/2022 00:34     ASSESSMENT AND PLAN    -Multidisciplinary rounds held today   Sustained V. tach  In context of ischemic cardiomyopathy with an EF 30-35% -cardiology on case appreciate input - Dr 03/14/2022 and Cadence Kirke Corin -s/p synchronized cardioversion x2 and amiodarone bolus x2,  -change amio IV to PO -possible ICD placement  CAD  - cardiology on case - appreciate input - Ranexa, imdur, coreg , plavix  -CAD with multiple PCI's, Left heart cath 12/30/2021 patent stents in LAD and left circumflex   Anaphylaxis from Wasp sting -Received methylprednisolone 125 mg IV, epinephrine 0.3 mg and Pepcid 20 mg IV    Chronic Respiratory Failure without hypoxia PMHx: Underlying undiagnosed  COPD, OSA, former smoker 20+years -Supplemental O2 as needed to maintain O2 saturations 88 to 92% -Follow intermittent ABG and chest x-ray as needed -As needed bronchodilators    AKI on CKD,  -Monitor I&O's / urinary output -Follow BMP -Ensure adequate renal perfusion -Avoid nephrotoxic agents as able -Replace electrolytes as indicated   T2DM HgbA1c 7.5 -CBG's AC & hs; Target range of 140 to 180 -SSI -Follow ICU Hypo/Hyperglycemia protocol -Hold Metformin       Best practice:  Diet:  NPO Pain/Anxiety/Delirium protocol (if indicated): No VAP protocol (if indicated): Not indicated DVT prophylaxis: Subcutaneous Heparin GI prophylaxis: H2B Glucose control:  SSI Yes Central venous access:  N/A Arterial line:  N/A Foley:  N/A Mobility:  bed rest  PT consulted: N/A Last date of multidisciplinary goals of care discussion [7/9] Code Status:  full code Disposition: ICU     GI/Nutrition GI PROPHYLAXIS as indicated DIET-->TF's as tolerated Constipation protocol as indicated  ENDO - ICU hypoglycemic\Hyperglycemia protocol -check FSBS per protocol   ELECTROLYTES -follow labs as needed -replace as needed -pharmacy consultation   DVT/GI PRX ordered -SCDs  TRANSFUSIONS AS NEEDED MONITOR FSBS ASSESS the need for LABS as needed   Critical care provider statement:   Total critical care time: 33 minutes   Performed by: 01/01/2022 MD   Critical care time was exclusive of separately billable procedures and treating other patients.   Critical care was necessary to treat or prevent imminent or life-threatening deterioration.   Critical care was time spent personally by me on the following activities: development of treatment plan with patient and/or surrogate as well as nursing, discussions with consultants, evaluation of patient's response to treatment, examination of patient, obtaining history from patient or surrogate, ordering and performing treatments and  interventions, ordering and review of laboratory studies, ordering and review of radiographic studies, pulse oximetry and re-evaluation of patient's condition.    Karna Christmas, [name]  Pulmonary & Critical  Care Medicine

## 2022-01-11 NOTE — ED Provider Notes (Signed)
[facility] Provider Note    Event Date/Time   First MD Initiated Contact with Patient 01/10/22 2243     (approximate)   History   Chest Pain   HPI  [name]. is a 66 y.o. male who presented to the emergency department today with concerns for chest pain after being stung by roughly 15-20 yellow jackets.  Patient denies known allergic reactions to yellowjacket.  Does have history of MI and had recent hospitalization for stroke.     Physical Exam   Triage Vital Signs: ED Triage Vitals  Enc Vitals Group     BP 01/10/22 2128 125/70     Pulse Rate 01/10/22 2128 (!) 175     Resp 01/10/22 2128 16     Temp 01/10/22 2128 98 F (36.7 C)     Temp Source 01/10/22 2128 Oral     SpO2 01/10/22 2128 98 %     Weight 01/10/22 2134 207 lb 10.8 oz (94.2 kg)     Height 01/10/22 2134 5\' 6"  (1.676 m)     Head Circumference --      Peak Flow --      Pain Score 01/10/22 2134 10     Pain Loc --      Pain Edu? --      Excl. in GC? --     Most recent vital signs: Vitals:   01/10/22 2300 01/10/22 2338  BP: (!) 148/87 (!) 158/98  Pulse: 84 92  Resp: 18 (!) 21  Temp:  98.2 F (36.8 C)  SpO2: 98% 99%    General: Somnolent, will answer some questions with strong verbal stimuli CV:  Tachycardia Resp:  Normal effort. Lungs clear. Abd:  No distention.    ED Results / Procedures / Treatments   Labs (all labs ordered are listed, but only abnormal results are displayed) Labs Reviewed  CBC WITH DIFFERENTIAL/PLATELET - Abnormal; Notable for the following components:      Result Value   WBC 17.7 (*)    Neutro Abs 13.7 (*)    Monocytes Absolute 1.5 (*)    Abs Immature Granulocytes 0.16 (*)    All other components within normal limits  BASIC METABOLIC PANEL - Abnormal; Notable for the following components:   CO2 19 (*)    Glucose, Bld 266 (*)    Creatinine, Ser 1.26 (*)    All other components within normal limits  GLUCOSE, CAPILLARY - Abnormal;  Notable for the following components:   Glucose-Capillary 265 (*)    All other components within normal limits  TROPONIN I (HIGH SENSITIVITY) - Abnormal; Notable for the following components:   Troponin I (High Sensitivity) 137 (*)    All other components within normal limits  MRSA NEXT GEN BY PCR, NASAL  BASIC METABOLIC PANEL  BRAIN NATRIURETIC PEPTIDE  LACTIC ACID, PLASMA  TROPONIN I (HIGH SENSITIVITY)     EKG  I, 2339, attending physician, personally viewed and interpreted this EKG  EKG Time: 2128 Rate: 170 Rhythm: ventricular tachycardia Axis: - Intervals: qtc - QRS: wide ST changes: - Impression: ventricular tachycardia  I, 2129, attending physician, personally viewed and interpreted this EKG  EKG Time: 2131 Rate: 114 Rhythm: sinus tachycardia with pvc Axis: right axis deviation Intervals: qtc 481 QRS: narrow ST changes: no st elevation Impression: abnormal ekg  RADIOLOGY None   PROCEDURES:  Critical Care performed: Yes, see critical care procedure note(s)  .Sedation  Date/Time: 01/11/2022 12:33 AM  Performed  by: Nance Pear, MD Authorized by: Nance Pear, MD   Consent:    Consent obtained:  Emergent situation Universal protocol:    Immediately prior to procedure, a time out was called: yes   Pre-sedation assessment:    Time since last food or drink:  Unknown   NPO status caution: unable to specify NPO status     ASA classification: class 3 - patient with severe systemic disease     Mallampati score:  IV - only hard palate visible   Pre-sedation assessments completed and reviewed: airway patency, cardiovascular function, hydration status, mental status, nausea/vomiting, pain level, respiratory function and temperature   Procedure details (see MAR for exact dosages):    Total Provider sedation time (minutes):  30 Post-procedure details:    Recovery: Patient returned to pre-procedure baseline     Post-sedation  assessments completed and reviewed: airway patency, cardiovascular function, hydration status, mental status, nausea/vomiting, pain level, respiratory function and temperature     Patient is stable for discharge or admission: yes     CRITICAL CARE Performed by: Nance Pear   Total critical care time: 45 minutes  Critical care time was exclusive of separately billable procedures and treating other patients.  Critical care was necessary to treat or prevent imminent or life-threatening deterioration.  Critical care was time spent personally by me on the following activities: development of treatment plan with patient and/or surrogate as well as nursing, discussions with consultants, evaluation of patient's response to treatment, examination of patient, obtaining history from patient or surrogate, ordering and performing treatments and interventions, ordering and review of laboratory studies, ordering and review of radiographic studies, pulse oximetry and re-evaluation of patient's condition.  ELECTRIC CARDIOVERSION Performed by: Nance Pear The procedure was performed in an emergent situation.  Consent given by: emergent Required items: required blood products, implants, devices, and special equipment available Patient sedated: yes Sedation type: moderate (conscious) sedation Sedatives: etomidate Analgesia:  None Cardioversion basis: emergent Pre-procedure rhythm: ventricular tachycardia Patient position: patient was placed in a supine position Chest area: chest area exposed Electrodes: pads Electrodes placed: anterior-posterior Number of attempts: 1 Attempt 1 mode: synchronous Attempt 1 waveform: biphasic Attempt 1 shock (in Joules): 120 Attempt 1 outcome: conversion to normal sinus rhythm Post-procedure rhythm: normal sinus rhythm Complications: no complications Patient tolerance: Patient tolerated the procedure well with no immediate complications  ELECTRIC  CARDIOVERSION Performed by: Nance Pear The procedure was performed in an emergent situation.  Consent given by: emergent Required items: required blood products, implants, devices, and special equipment available Patient sedated: yes Sedation type: moderate (conscious) sedation Sedatives: etomidate Analgesia: None Cardioversion basis: emergent Pre-procedure rhythm: ventricular tachycardia Patient position: patient was placed in a supine position Chest area: chest area exposed Electrodes: pads Electrodes placed: anterior-posterior Number of attempts: 1 Attempt 1 mode: synchronous Attempt 1 waveform: biphasic Attempt 1 shock (in Joules): 120 Attempt 1 outcome: conversion to normal sinus rhythm Post-procedure rhythm: normal sinus rhythm Complications: no complications Patient tolerance: Patient tolerated the procedure well with no immediate complications   MEDICATIONS ORDERED IN ED: Medications  amiodarone (NEXTERONE) 150-4.21 MG/100ML-% bolus (  Not Given 01/10/22 2232)  amiodarone (NEXTERONE PREMIX) 360-4.14 MG/200ML-% (1.8 mg/mL) IV infusion (60 mg/hr Intravenous Infusion Verify 01/11/22 0000)  amiodarone (NEXTERONE PREMIX) 360-4.14 MG/200ML-% (1.8 mg/mL) IV infusion (has no administration in time range)  docusate sodium (COLACE) capsule 100 mg (has no administration in time range)  polyethylene glycol (MIRALAX / GLYCOLAX) packet 17 g (has no administration in time range)  insulin aspart (novoLOG) injection 0-20 Units (11 Units Subcutaneous Given 01/10/22 2351)  etomidate (AMIDATE) injection 9.42 mg (9.42 mg Intravenous Given 01/10/22 2130)  methylPREDNISolone sodium succinate (SOLU-MEDROL) 125 mg/2 mL injection (125 mg  Given 01/10/22 2135)  famotidine (PEPCID) 20-0.9 MG/50ML-% IVPB (0 mg  Stopped 01/10/22 2231)  sterile water (preservative free) injection (  Given 01/10/22 2137)  amiodarone (NEXTERONE) IV bolus only 150 mg/100 mL (0 mg Intravenous Stopped 01/10/22 2158)  0.9 %  sodium  chloride infusion (1,000 mLs Intravenous New Bag/Given 01/10/22 2142)  amiodarone (NEXTERONE) IV bolus only 150 mg/100 mL (0 mg Intravenous Stopped 01/10/22 2202)  etomidate (AMIDATE) injection 9.42 mg (9.42 mg Intravenous Given 01/10/22 2203)  EPINEPHrine (EPI-PEN) injection 0.3 mg (0.3 mg Intramuscular Given 01/10/22 2217)  diphenhydrAMINE (BENADRYL) injection 25 mg (25 mg Intravenous Given 01/11/22 0002)  ketorolac (TORADOL) 30 MG/ML injection 30 mg (30 mg Intravenous Given 01/11/22 0002)     IMPRESSION / MDM / ASSESSMENT AND PLAN / ED COURSE  I reviewed the triage vital signs and the nursing notes.                              Differential diagnosis includes, but is not limited to, anaphylaxis, MI, electrolyte abnormality.  Patient's presentation is most consistent with acute presentation with potential threat to life or bodily function.  Patient presented to the emergency department today because of concerns for chest pain after being stung by 15 to 20  yellow jackets.  Patient without known history of allergies to yellowjacket.  Upon arrival to the emergency department the patient was found to be tachycardic and EKG was consistent with ventricular tachycardia.  Given the patient was somnolent and not at his mental baseline he was emergently electrically cardioverted.  He did return to normal sinus rhythm.  He was given an amnio as well as epi Benadryl Pepcid and Solu-Medrol to help with possible anaphylactic reaction.  However shortly after getting patient back into normal sinus rhythm he again went into ventricular tachycardia.  He was given amiodarone initially to see if that would help however he required additional electrical cardioversion.  This again returned to the normal sinus rhythm.  Repeat ekgs without any ST elevation. At this time have lower suspicion for ACS causing the patient's symptoms. He was put on an amnio drip.  I discussed with Provider Ouma with the ICU team who will plan on  admission.  FINAL CLINICAL IMPRESSION(S) / ED DIAGNOSES   Final diagnoses:  Anaphylaxis, initial encounter  Ventricular tachycardia (HCC)      Note:  This document was prepared using Dragon voice recognition software and may include unintentional dictation errors.    Phineas Semen, MD 01/11/22 [phone]
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
## 2022-01-11 NOTE — Progress Notes (Signed)
NP notified that patient has no diet ordered and is requesting ice cream. Acknowledged. Keep patient NPO, ice chips are ok.

## 2022-01-11 NOTE — Progress Notes (Signed)
NP notified of critical lactic acid result, 2.1 acknowledged, no new orders at this time.
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
## 2022-01-11 NOTE — Progress Notes (Signed)
NP notified of patients c/o nausea. See new order for IV zofran.

## 2022-01-11 NOTE — Progress Notes (Signed)
I discussed the case with Dr. Elberta Fortis from EP.  Recommend continued treatment with amiodarone.  If no recurrent ventricular tachycardia on medical therapy, the patient can be discharged once stabilized with plans for outpatient EP consultation.

## 2022-01-11 NOTE — Progress Notes (Signed)
ANTICOAGULATION CONSULT NOTE  Pharmacy Consult for Heparin  Indication: chest pain/ACS  Allergies  Allergen Reactions   Contrast Media [Iodinated Contrast Media] Shortness Of Breath   Iohexol Shortness Of Breath     Onset Date: [id number]    Jardiance [Empagliflozin] Other (See Comments)    Fatigue/weakness   Atorvastatin Other (See Comments)    Myalgias    Hydrocodone Itching   Morphine And Related Other (See Comments)    Lost control    Penicillins Other (See Comments)    Unknown reaction Did it involve swelling of the face/tongue/throat, SOB, or low BP? Unknown Did it involve sudden or severe rash/hives, skin peeling, or any reaction on the inside of your mouth or nose? Unknown Did you need to seek medical attention at a hospital or doctor's office? Yes When did it last happen?      Childhood allergy  If all above answers are "NO", may proceed with cephalosporin use.    Rosuvastatin Other (See Comments)    Myalgias     Patient Measurements: Height: 5\' 6"  (167.6 cm) Weight: 95.2 kg (209 lb 14.1 oz) IBW/kg (Calculated) : 63.8 Heparin Dosing Weight: 84.1 kg   Vital Signs: Temp: 98.7 F (37.1 C) (07/10 1957) Temp Source: Oral (07/10 1957) BP: 129/73 (07/10 1957) Pulse Rate: 60 (07/10 1957)  Labs: Recent Labs    01/10/22 2131 01/11/22 0002 01/11/22 0208 01/11/22 0812 01/11/22 2016  HGB 16.7  --  15.0  --   --   HCT 47.6  --  44.7  --   --   PLT 305  --  224  --   --   APTT  --   --  27  --   --   LABPROT  --   --  14.0  --   --   INR  --   --  1.1  --   --   HEPARINUNFRC  --   --   --  <0.10* 0.55  CREATININE 1.26*  --  1.16  --   --   TROPONINIHS 137* 3,544*  --   --   --      Estimated Creatinine Clearance: 68.6 mL/min (by C-G formula based on SCr of 1.16 mg/dL).   Medical History: Past Medical History:  Diagnosis Date   Anginal pain (HCC)    Arthritis    CAD (coronary artery disease)    a. s/p MI with LAD and Diag stenting @ [name];  b. 06/2015  Cath: LAD 62m/d ISR, 100 RCA (ISR) w/ L->R collats, otw mod nonobs dzs-->Med Rx; c. 08/2019 Cath: LM nl, LAD 10p/m ISR, D1 20, D2 100, RI min irregs, LCX 62m/d, OM1 100, OM2 50, RCA 100p, 70d. RPDA fills via collats from LAD. EF 25-35%-->Med Rx; d. 10/2019 NSTEMI/Cath: LCX now 100 (2.75x15 Resolute Onyx DES), otw stable compared to 08/2019.   Chronic combined systolic and diastolic CHF (congestive heart failure) (HCC)    a. 06/2015 Echo: EF 20-25%, Gr3 DD; b. 10/2019 Echo: EF 25-30%, glob HK, sev inf/infapical HK. Mod dil LA.   Dyspnea    Essential hypertension    Headache 12/23/2021   Hyperlipidemia    Hypokalemia    a. 06/2015 in setting of diuresis.   Ischemic cardiomyopathy    a. 2011 EF 45% ([name]);  b. 06/2015 Echo: EF 20-25%; c. 04/2019 Echo: EF 40-45%; d. 08/2019 LV gram: EF 25-35%; e. 10/2019 Echo: EF 25-30%.   PVC's (premature ventricular contractions)    a. 09/2019 [facility] (  3 days): Avg hr 70, 4 runs NSVT, 5 runs SVT, rare PACs, frequent PVCs w/ 11.8% burden.   Sleep apnea    Stroke/Right temporal lobe infarction [facility])    a. 10/2019 MRI brain: 1cm acute ischemic nonhemorrhagic R temporal lobe infarct. Age-related cerebral atrophy w/ moderate chronic small vessel ischemic dzs.   Type 2 diabetes mellitus with hyperglycemia (HCC) 10/08/2019    Medications:  Medications Prior to Admission  Medication Sig Dispense Refill Last Dose   aspirin EC 81 MG tablet Take 1 tablet (81 mg total) by mouth daily. Swallow whole. 17 tablet 12 Past Week at unknown   albuterol (PROVENTIL) (2.5 MG/3ML) 0.083% nebulizer solution Inhale 3 mLs into the lungs every 6 (six) hours as needed for wheezing or shortness of breath. (Patient not taking: Reported on 01/10/2022) 75 mL 12 Not Taking   Budeson-Glycopyrrol-Formoterol (BREZTRI AEROSPHERE) 160-9-4.8 MCG/ACT AERO Inhale 2 puffs into the lungs in the morning and at bedtime. (Patient not taking: Reported on 01/10/2022) 5.9 g 1 Not Taking   carvedilol (COREG) 3.125 MG tablet Take  1 tablet (3.125 mg total) by mouth 2 (two) times daily with a meal. (Patient not taking: Reported on 01/10/2022) 180 tablet 3 Not Taking   clopidogrel (PLAVIX) 75 MG tablet Take 1 tablet (75 mg total) by mouth once daily. (Patient not taking: Reported on 01/10/2022) 90 tablet 3 Not Taking   empagliflozin (JARDIANCE) 10 MG TABS tablet Take 10 mg by mouth daily before breakfast. (Patient not taking: Reported on 01/10/2022)   Not Taking   ezetimibe (ZETIA) 10 MG tablet Take 1 tablet (10 mg total) by mouth daily. (Patient not taking: Reported on 01/10/2022) 30 tablet 1 Not Taking   furosemide (LASIX) 40 MG tablet Take 1 tablet (40 mg total) by mouth once daily as needed. (Patient not taking: Reported on 01/10/2022) 90 tablet 3 Not Taking   isosorbide mononitrate (IMDUR) 30 MG 24 hr tablet Take (1/2) tablet (15 mg total) by mouth once daily. (Patient not taking: Reported on 01/10/2022) 45 tablet 3 Not Taking   metFORMIN (GLUCOPHAGE) 500 MG tablet Take 1 tablet (500 mg total) by mouth 2 (two) times daily with a meal. (Patient not taking: Reported on 01/10/2022) 60 tablet 11 Not Taking   nitroGLYCERIN (NITROSTAT) 0.4 MG SL tablet Place 1 tablet (0.4 mg total) under the tongue every 5 (five) minutes as needed for chest pain. (Patient not taking: Reported on 01/10/2022) 90 tablet 3 Not Taking   ranolazine (RANEXA) 500 MG 12 hr tablet Take 1 tablet (500 mg total) by mouth 2 (two) times daily. (Patient not taking: Reported on 01/10/2022) 60 tablet 2 Not Taking   sacubitril-valsartan (ENTRESTO) 24-26 MG Take 1 tablet by mouth 2 (two) times daily. (Patient not taking: Reported on 01/10/2022) 60 tablet 3 Not Taking   spironolactone (ALDACTONE) 25 MG tablet Take 0.5 tablets (12.5 mg total) by mouth daily. (Patient not taking: Reported on 01/10/2022) 45 tablet 3 Not Taking  Heparin Dosing Weight: 84.1 kg   Assessment: Pharmacy consulted to dose heparin in this 66 year old male admitted with ACS/NSTEMI following being stung by ~ 15-20  yellow jackets.   Pt has hx of MI and stroke but no prior anticoag noted on PTA med list.   CrCl = 63.2 ml/min  Date Time  HL Rate/Comment 0710 0812 <0.1 Subtherapeutic; 1100 > 1600 un/hr 0710 2016 0.55 Therapeutic; 1600 un/hr  Baseline Labs: aPTT - 27s INR - 1.1 Hgb - 16.7>15 Plts - 305>224 Trop - 253>6644  Goal of Therapy:  Heparin level 0.3-0.7 units/ml Monitor platelets by anticoagulation protocol: Yes   Plan:  HL therapeutic x1 at current rate 0.55. Continue heparin infusion rate to 1600 units/hr (rate based no data from previous admission on IV heparin) Check anti-Xa level in 6 hours and daily while on heparin Continue to monitor H&H and platelets  Martyn Malay 01/11/2022,8:45 PM

## 2022-01-11 NOTE — Progress Notes (Signed)
Webb Silversmith, notified of patients c/o headache 10/10. Acknowledged, see new orders.

## 2022-01-11 NOTE — Progress Notes (Signed)
ANTICOAGULATION CONSULT NOTE  Pharmacy Consult for Heparin  Indication: chest pain/ACS  Allergies  Allergen Reactions   Contrast Media [Iodinated Contrast Media] Shortness Of Breath   Iohexol Shortness Of Breath     Onset Date: [id number]    Jardiance [Empagliflozin] Other (See Comments)    Fatigue/weakness   Atorvastatin Other (See Comments)    Myalgias    Hydrocodone Itching   Morphine And Related Other (See Comments)    Lost control    Penicillins Other (See Comments)    Unknown reaction Did it involve swelling of the face/tongue/throat, SOB, or low BP? Unknown Did it involve sudden or severe rash/hives, skin peeling, or any reaction on the inside of your mouth or nose? Unknown Did you need to seek medical attention at a hospital or doctor's office? Yes When did it last happen?      Childhood allergy  If all above answers are "NO", may proceed with cephalosporin use.    Rosuvastatin Other (See Comments)    Myalgias     Patient Measurements: Height: 5\' 6"  (167.6 cm) Weight: 95.2 kg (209 lb 14.1 oz) IBW/kg (Calculated) : 63.8 Heparin Dosing Weight: 84.1 kg   Vital Signs: Temp: 98.5 F (36.9 C) (07/10 0400) Temp Source: Oral (07/10 0400) BP: 148/99 (07/10 0630) Pulse Rate: 62 (07/10 0630)  Labs: Recent Labs    01/10/22 2131 01/11/22 0002 01/11/22 0208  HGB 16.7  --   --   HCT 47.6  --   --   PLT 305  --   --   APTT  --   --  27  LABPROT  --   --  14.0  INR  --   --  1.1  CREATININE 1.26*  --  1.16  TROPONINIHS 137* 3,544*  --      Estimated Creatinine Clearance: 68.6 mL/min (by C-G formula based on SCr of 1.16 mg/dL).   Medical History: Past Medical History:  Diagnosis Date   Anginal pain (HCC)    Arthritis    CAD (coronary artery disease)    a. s/p MI with LAD and Diag stenting @ [name];  b. 06/2015 Cath: LAD 57m/d ISR, 100 RCA (ISR) w/ L->R collats, otw mod nonobs dzs-->Med Rx; c. 08/2019 Cath: LM nl, LAD 10p/m ISR, D1 20, D2 100, RI min irregs, LCX  79m/d, OM1 100, OM2 50, RCA 100p, 70d. RPDA fills via collats from LAD. EF 25-35%-->Med Rx; d. 10/2019 NSTEMI/Cath: LCX now 100 (2.75x15 Resolute Onyx DES), otw stable compared to 08/2019.   Chronic combined systolic and diastolic CHF (congestive heart failure) (HCC)    a. 06/2015 Echo: EF 20-25%, Gr3 DD; b. 10/2019 Echo: EF 25-30%, glob HK, sev inf/infapical HK. Mod dil LA.   Dyspnea    Essential hypertension    Headache 12/23/2021   Hyperlipidemia    Hypokalemia    a. 06/2015 in setting of diuresis.   Ischemic cardiomyopathy    a. 2011 EF 45% ([name]);  b. 06/2015 Echo: EF 20-25%; c. 04/2019 Echo: EF 40-45%; d. 08/2019 LV gram: EF 25-35%; e. 10/2019 Echo: EF 25-30%.   PVC's (premature ventricular contractions)    a. 09/2019 [facility] (3 days): Avg hr 70, 4 runs NSVT, 5 runs SVT, rare PACs, frequent PVCs w/ 11.8% burden.   Sleep apnea    Stroke/Right temporal lobe infarction [facility])    a. 10/2019 MRI brain: 1cm acute ischemic nonhemorrhagic R temporal lobe infarct. Age-related cerebral atrophy w/ moderate chronic small vessel ischemic dzs.   Type 2  diabetes mellitus with hyperglycemia (HCC) 10/08/2019    Medications:  Medications Prior to Admission  Medication Sig Dispense Refill Last Dose   aspirin EC 81 MG tablet Take 1 tablet (81 mg total) by mouth daily. Swallow whole. 17 tablet 12 Past Week at unknown   albuterol (PROVENTIL) (2.5 MG/3ML) 0.083% nebulizer solution Inhale 3 mLs into the lungs every 6 (six) hours as needed for wheezing or shortness of breath. (Patient not taking: Reported on 01/10/2022) 75 mL 12 Not Taking   Budeson-Glycopyrrol-Formoterol (BREZTRI AEROSPHERE) 160-9-4.8 MCG/ACT AERO Inhale 2 puffs into the lungs in the morning and at bedtime. (Patient not taking: Reported on 01/10/2022) 5.9 g 1 Not Taking   carvedilol (COREG) 3.125 MG tablet Take 1 tablet (3.125 mg total) by mouth 2 (two) times daily with a meal. (Patient not taking: Reported on 01/10/2022) 180 tablet 3 Not Taking   clopidogrel  (PLAVIX) 75 MG tablet Take 1 tablet (75 mg total) by mouth once daily. (Patient not taking: Reported on 01/10/2022) 90 tablet 3 Not Taking   empagliflozin (JARDIANCE) 10 MG TABS tablet Take 10 mg by mouth daily before breakfast. (Patient not taking: Reported on 01/10/2022)   Not Taking   ezetimibe (ZETIA) 10 MG tablet Take 1 tablet (10 mg total) by mouth daily. (Patient not taking: Reported on 01/10/2022) 30 tablet 1 Not Taking   furosemide (LASIX) 40 MG tablet Take 1 tablet (40 mg total) by mouth once daily as needed. (Patient not taking: Reported on 01/10/2022) 90 tablet 3 Not Taking   isosorbide mononitrate (IMDUR) 30 MG 24 hr tablet Take (1/2) tablet (15 mg total) by mouth once daily. (Patient not taking: Reported on 01/10/2022) 45 tablet 3 Not Taking   metFORMIN (GLUCOPHAGE) 500 MG tablet Take 1 tablet (500 mg total) by mouth 2 (two) times daily with a meal. (Patient not taking: Reported on 01/10/2022) 60 tablet 11 Not Taking   nitroGLYCERIN (NITROSTAT) 0.4 MG SL tablet Place 1 tablet (0.4 mg total) under the tongue every 5 (five) minutes as needed for chest pain. (Patient not taking: Reported on 01/10/2022) 90 tablet 3 Not Taking   ranolazine (RANEXA) 500 MG 12 hr tablet Take 1 tablet (500 mg total) by mouth 2 (two) times daily. (Patient not taking: Reported on 01/10/2022) 60 tablet 2 Not Taking   sacubitril-valsartan (ENTRESTO) 24-26 MG Take 1 tablet by mouth 2 (two) times daily. (Patient not taking: Reported on 01/10/2022) 60 tablet 3 Not Taking   spironolactone (ALDACTONE) 25 MG tablet Take 0.5 tablets (12.5 mg total) by mouth daily. (Patient not taking: Reported on 01/10/2022) 45 tablet 3 Not Taking    Assessment: Pharmacy consulted to dose heparin in this 66 year old male admitted with ACS/NSTEMI following being stung by ~ 15-20 yellow jackets.   Pt has hx of MI and stroke but no prior anticoag noted on PTA med list.   CrCl = 63.2 ml/min  Goal of Therapy:  Heparin level 0.3-0.7 units/ml Monitor platelets by  anticoagulation protocol: Yes   Plan:  Bolus 2500 units IV heparin then increase  heparin infusion rate to 1600 units/hr (rate based no data from previous admission on IV heparin) Check anti-Xa level in 6 hours and daily while on heparin Continue to monitor H&H and platelets  Lowella Bandy 01/11/2022,7:05 AM

## 2022-01-11 NOTE — H&P (Incomplete)
NAME:  Rhone Ozaki., MRN:  [id number], DOB:  Nov 24, 1955, LOS: 0 ADMISSION DATE:  01/10/2022, CONSULTATION DATE:  *** REFERRING MD:  ***, CHIEF COMPLAINT:  ***    HPI  66 y.o with male significant PMH of  CAD s/p multiple PCI's, CTO RCA, Ischemic Cardiomyopathy with EF 30 to 35 %, former smoker x 20 + packs, HTN, HLD, T2DM, and recent CVA who presented to the ED with chief complaints of chest discomfort following wasps bite.  Patient report that was doing his normal routine work at home where he was bitten by a flock of wasps over his head, back, and arms. After about 15 min of the wasps bite, he experienced palpitations and chest discomfort which was sudden in onset and persistent. He described them as fast and regular thumping in the chest associated with nausea, vomiting, dizziness and shortness of breath. Patient report feeling sensation of his throat closing with pain in his bilateral arms, bac k of head and neck.  ED Course: In the emergency department, the temperature was C,  pulse was 180/min regular, blood pressure of 164/86 mmHg, respiratory rate of 16/min, and oxygen saturation of 98% at room air. He was anxious and restless. His cardiovascular examination was normal except for tachycardia. Initial 12-lead electrocardiogram (ECG) showed runs of sustained wide QRS-complex  ventricular tachycardia at a rate of 160 b.p.m. without detectable P-waves. A repeat bolus of 150 mg amiodarone i.v. was administered, which rapidly terminated VT and restored normal sinus rhythm. The patient was put on tablet metoprolol 25 mg twice a day. The patient was also given injection chlorpheniramine and dexamethasone followed by tablet cetirizine 10 mg in once a day.  Pertinent Labs in Red/Diagnostics Findings: Na+/ K+:  Glucose:  BUN/Cr.: Calcium:  AST/ALT:   WBC/ TMAX: / afebrile Hgb/Hct:  Plts:  PCT: negative <0.10 Lactic acid:  COVID PCR: Negative   Troponin:  BNP:  Arterial Blood Gas  result:  pO2 ***; pCO2 ***; pH ***;  HCO3 ***, %O2 Sat ***.   Past Medical History  ***  Significant Hospital Events   ***  Consults:  ***  Procedures:  ***  Significant Diagnostic Tests:  : Chest Xray> : Abdominal xray> : Noncontrast CT head : CTA abdomen and pelvis : CTA Chest  Micro Data:  : SARS-CoV-2 PCR> negative : Influenza PCR> negative : Blood culture x2> : Urine Culture> : MRSA PCR>>  : Strep pneumo urinary antigen> : Legionella urinary antigen> : Mycoplasma pneumonia>  Antimicrobials:  Vancomycin Cefepime Azithromycin Ceftriaxone Metronidazole  OBJECTIVE  Blood pressure (!) 148/87, pulse 84, temperature 98 F (36.7 C), temperature source Oral, resp. rate 18, height 5\' 6"  (1.676 m), weight 94.2 kg, SpO2 98 %.        Intake/Output Summary (Last 24 hours) at 01/10/2022 2323 Last data filed at 01/10/2022 2231 Gross per 24 hour  Intake 50 ml  Output --  Net 50 ml   Filed Weights   01/10/22 2134  Weight: 94.2 kg     Physical Examination  GENERAL: year-old critically ill patient lying in the bed with no acute distress.  EYES: Pupils equal, round, reactive to light and accommodation. No scleral icterus. Extraocular muscles intact.  HEENT: Head atraumatic, normocephalic. Oropharynx and nasopharynx clear.  NECK:  Supple, no jugular venous distention. No thyroid enlargement, no tenderness.  LUNGS: Normal breath sounds bilaterally, no wheezing, rales,rhonchi or crepitation. No use of accessory muscles of respiration.  CARDIOVASCULAR: S1, S2 normal. No murmurs, rubs,  or gallops.  ABDOMEN: Soft, nontender, nondistended. Bowel sounds present. No organomegaly or mass.  EXTREMITIES: No pedal edema, cyanosis, or clubbing.  NEUROLOGIC: Cranial nerves II through XII are intact.  Muscle strength 5/5 in all extremities. Sensation intact. Gait not checked.  PSYCHIATRIC: The patient is alert and oriented x 3.  SKIN: No obvious rash, lesion, or ulcer.    Labs/imaging that I {ACTIONS; HAVE/HAVE NOT:19434}personally reviewed  (right click and "Reselect all SmartList Selections" daily)     Labs   CBC: Recent Labs  Lab 01/10/22 2131  WBC 17.7*  NEUTROABS 13.7*  HGB 16.7  HCT 47.6  MCV 85.3  PLT 305    Basic Metabolic Panel: Recent Labs  Lab 01/10/22 2131  NA 142  K 4.2  CL 111  CO2 19*  GLUCOSE 266*  BUN 23  CREATININE 1.26*  CALCIUM 9.7   GFR: Estimated Creatinine Clearance: 62.8 mL/min (A) (by C-G formula based on SCr of 1.26 mg/dL (H)). Recent Labs  Lab 01/10/22 2131  WBC 17.7*    Liver Function Tests: No results for input(s): "AST", "ALT", "ALKPHOS", "BILITOT", "PROT", "ALBUMIN" in the last 168 hours. No results for input(s): "LIPASE", "AMYLASE" in the last 168 hours. No results for input(s): "AMMONIA" in the last 168 hours.  ABG No results found for: "PHART", "PCO2ART", "PO2ART", "HCO3", "TCO2", "ACIDBASEDEF", "O2SAT"   Coagulation Profile: No results for input(s): "INR", "PROTIME" in the last 168 hours.  Cardiac Enzymes: No results for input(s): "CKTOTAL", "CKMB", "CKMBINDEX", "TROPONINI" in the last 168 hours.  HbA1C: Hgb A1c MFr Bld  Date/Time Value Ref Range Status  12/23/2021 08:04 PM 7.5 (H) 4.8 - 5.6 % Final    Comment:    (NOTE) Pre diabetes:          5.7%-6.4%  Diabetes:              >6.4%  Glycemic control for   <7.0% adults with diabetes   06/18/2021 04:29 AM 7.4 (H) 4.8 - 5.6 % Final    Comment:    (NOTE)         Prediabetes: 5.7 - 6.4         Diabetes: >6.4         Glycemic control for adults with diabetes: <7.0     CBG: No results for input(s): "GLUCAP" in the last 168 hours.  Review of Systems:   ***  Past Medical History  He,  has a past medical history of Anginal pain (HCC), Arthritis, CAD (coronary artery disease), Chronic combined systolic and diastolic CHF (congestive heart failure) (HCC), Dyspnea, Essential hypertension, Headache (12/23/2021), Hyperlipidemia,  Hypokalemia, Ischemic cardiomyopathy, PVC's (premature ventricular contractions), Sleep apnea, Stroke/Right temporal lobe infarction [facility]), and Type 2 diabetes mellitus with hyperglycemia (HCC) (10/08/2019).   Surgical History    Past Surgical History:  Procedure Laterality Date  . BACK SURGERY  04/2019  . CARDIAC CATHETERIZATION N/A 06/10/2015   Procedure: Left Heart Cath;  Surgeon: Iran Ouch, MD;  Location: [facility] INVASIVE CV LAB;  Service: Cardiovascular;  Laterality: N/A;  . CORONARY STENT INTERVENTION N/A 10/08/2019   Procedure: CORONARY STENT INTERVENTION;  Surgeon: Iran Ouch, MD;  Location: [facility] INVASIVE CV LAB;  Service: Cardiovascular;  Laterality: N/A;  . CORONARY STENT PLACEMENT    . LEFT HEART CATH AND CORONARY ANGIOGRAPHY N/A 10/08/2019   Procedure: LEFT HEART CATH AND CORONARY ANGIOGRAPHY poss pci;  Surgeon: Iran Ouch, MD;  Location: [facility] INVASIVE CV LAB;  Service: Cardiovascular;  Laterality: N/A;  .  RIGHT HEART CATH AND CORONARY ANGIOGRAPHY N/A 12/30/2021   Procedure: RIGHT HEART CATH AND CORONARY ANGIOGRAPHY;  Surgeon: Yvonne Kendall, MD;  Location: [facility] INVASIVE CV LAB;  Service: Cardiovascular;  Laterality: N/A;  . RIGHT/LEFT HEART CATH AND CORONARY ANGIOGRAPHY N/A 08/20/2019   Procedure: RIGHT/LEFT HEART CATH AND CORONARY ANGIOGRAPHY;  Surgeon: Iran Ouch, MD;  Location: [facility] INVASIVE CV LAB;  Service: Cardiovascular;  Laterality: N/A;     Social History   reports that he quit smoking about 10 years ago. His smoking use included cigarettes. He has never used smokeless tobacco. He reports that he does not drink alcohol and does not use drugs.   Family History   His family history includes Coronary artery disease in his father and mother; Diabetes in his mother.   Allergies Allergies  Allergen Reactions  . Contrast Media [Iodinated Contrast Media] Shortness Of Breath  . Iohexol Shortness Of Breath     Onset Date: [id number]   . Jardiance  [Empagliflozin] Other (See Comments)    Fatigue/weakness  . Atorvastatin Other (See Comments)    Myalgias   . Hydrocodone Itching  . Morphine And Related Other (See Comments)    Lost control   . Penicillins Other (See Comments)    Unknown reaction Did it involve swelling of the face/tongue/throat, SOB, or low BP? Unknown Did it involve sudden or severe rash/hives, skin peeling, or any reaction on the inside of your mouth or nose? Unknown Did you need to seek medical attention at a hospital or doctor's office? Yes When did it last happen?      Childhood allergy  If all above answers are "NO", may proceed with cephalosporin use.   . Rosuvastatin Other (See Comments)    Myalgias      Home Medications  Prior to Admission medications   Medication Sig Start Date End Date Taking? Authorizing Provider  aspirin EC 81 MG tablet Take 1 tablet (81 mg total) by mouth daily. Swallow whole. 01/01/22  Yes Hollice Espy, MD  albuterol (PROVENTIL) (2.5 MG/3ML) 0.083% nebulizer solution Inhale 3 mLs into the lungs every 6 (six) hours as needed for wheezing or shortness of breath. Patient not taking: Reported on 01/10/2022 12/31/21   Hollice Espy, MD  Budeson-Glycopyrrol-Formoterol (BREZTRI AEROSPHERE) 160-9-4.8 MCG/ACT AERO Inhale 2 puffs into the lungs in the morning and at bedtime. Patient not taking: Reported on 01/10/2022 12/30/21   Hollice Espy, MD  carvedilol (COREG) 3.125 MG tablet Take 1 tablet (3.125 mg total) by mouth 2 (two) times daily with a meal. Patient not taking: Reported on 01/10/2022 12/31/21   Hollice Espy, MD  clopidogrel (PLAVIX) 75 MG tablet Take 1 tablet (75 mg total) by mouth once daily. Patient not taking: Reported on 01/10/2022 12/31/21   Hollice Espy, MD  empagliflozin (JARDIANCE) 10 MG TABS tablet Take 10 mg by mouth daily before breakfast. Patient not taking: Reported on 01/10/2022    [provider]  ezetimibe (ZETIA) 10 MG tablet Take 1 tablet  (10 mg total) by mouth daily. Patient not taking: Reported on 01/10/2022 01/01/22   Hollice Espy, MD  furosemide (LASIX) 40 MG tablet Take 1 tablet (40 mg total) by mouth once daily as needed. Patient not taking: Reported on 01/10/2022 12/31/21   Hollice Espy, MD  isosorbide mononitrate (IMDUR) 30 MG 24 hr tablet Take (1/2) tablet (15 mg total) by mouth once daily. Patient not taking: Reported on 01/10/2022 12/31/21   Hollice Espy, MD  metFORMIN (GLUCOPHAGE) 500 MG tablet Take 1 tablet (500 mg total) by mouth 2 (two) times daily with a meal. Patient not taking: Reported on 01/10/2022 12/31/21 12/31/22  Hollice Espy, MD  nitroGLYCERIN (NITROSTAT) 0.4 MG SL tablet Place 1 tablet (0.4 mg total) under the tongue every 5 (five) minutes as needed for chest pain. Patient not taking: Reported on 01/10/2022 12/31/21 03/31/22  Hollice Espy, MD  ranolazine (RANEXA) 500 MG 12 hr tablet Take 1 tablet (500 mg total) by mouth 2 (two) times daily. Patient not taking: Reported on 01/10/2022 12/31/21   Hollice Espy, MD  sacubitril-valsartan (ENTRESTO) 24-26 MG Take 1 tablet by mouth 2 (two) times daily. Patient not taking: Reported on 01/10/2022 12/31/21   Hollice Espy, MD  spironolactone (ALDACTONE) 25 MG tablet Take 0.5 tablets (12.5 mg total) by mouth daily. Patient not taking: Reported on 01/10/2022 01/01/22   Hollice Espy, MD      Active Hospital Problem list   ***  Assessment & Plan:    Best practice:  Diet:  {GUYQ:03474} Pain/Anxiety/Delirium protocol (if indicated): {Pain/Anxiety/Delirium:26941} VAP protocol (if indicated): {VAP:29640} DVT prophylaxis: {DVT Prophylaxis:26933} GI prophylaxis: {GI:26934} Glucose control:  {Glucose Control:26935} Central venous access:  {Central Venous Access:26936} Arterial line:  {Central Venous Access:26936} Foley:  {Central Venous Access:26936} Mobility:  {Mobility:26937}  PT consulted: {PT Consult:26938} Last date of  multidisciplinary goals of care discussion [***] Code Status:  {Code Status:26939} Disposition: ***   = Goals of Care = Code Status Order: @CODE @   Primary Emergency Contact: Bothwell,Danzig H, Home Phone: [phone] Wishes to pursue full aggressive treatment and intervention options, including CPR and intubation, but goals of care will be addressed on going with family if that should become necessary. Patient wishes to pursue ongoing treatment with relatively conservative measures (e.g., IV fluids, antibiotics), but would not wish to escalate to ICU level of care or other invasive measures. Patient wishes to pursue ongoing treatment, but concurred that if deteriorated to pulselessness, patient would prefer a natural death as opposed to invasive measures such as CPR and intubation.  Family should be immediately contacted in such situations.  Critical care time: 45 minutes     [phone], DNP, CCRN, FNP-C, AGACNP-BC Acute Care Nurse Practitioner  [facility] Pager: [phone] [place] [facility]

## 2022-01-11 NOTE — Progress Notes (Signed)
NP notified of critical troponin result, 3544 and headache unrelieved with previous ordered medications, currently rates 8/10. Acknowledged and NP states that she will come and assess the patient. See new orders entered for EKG, PRN pain medication and heparin drip.

## 2022-01-11 NOTE — Progress Notes (Signed)
PHARMACY CONSULT NOTE - FOLLOW UP  Pharmacy Consult for Electrolyte Monitoring and Replacement   Recent Labs: Potassium (mmol/L)  Date Value  01/11/2022 4.6   Magnesium (mg/dL)  Date Value  61/95/0932 2.1   Calcium (mg/dL)  Date Value  67/06/4579 8.9   Albumin (g/dL)  Date Value  99/83/3825 4.0   Sodium (mmol/L)  Date Value  01/11/2022 138     Assessment: 66 y.o with male significant PMH of  CAD s/p multiple PCI's, CTO RCA, Ischemic Cardiomyopathy with EF 30 to 35 %, former smoker x 20 + packs, HTN, HLD, T2DM, and recent CVA who presented to the ED with chief complaints of chest discomfort following multiple wasp bites. Pharmacy is asked to follow and replace electrolytes while in CCU  Goal of Therapy:  Potassium 4.0 - 5.1 mmol/L Magnesium 20. - 2.4 mg/dL All Other Electrolytes WNL  Plan:  --no electrolyte replacement warranted for today =recheck electrolytes in am 01/12/22  Lowella Bandy ,PharmD Clinical Pharmacist 01/11/2022 7:45 AM

## 2022-01-11 NOTE — Progress Notes (Signed)
Webb Silversmith, NP notified of critical troponin result 137. Acknowledged, see new orders.

## 2022-01-11 NOTE — Progress Notes (Signed)
ANTICOAGULATION CONSULT NOTE - Initial Consult  Pharmacy Consult for Heparin  Indication: chest pain/ACS  Allergies  Allergen Reactions   Contrast Media [Iodinated Contrast Media] Shortness Of Breath   Iohexol Shortness Of Breath     Onset Date: 58099833    Jardiance [Empagliflozin] Other (See Comments)    Fatigue/weakness   Atorvastatin Other (See Comments)    Myalgias    Hydrocodone Itching   Morphine And Related Other (See Comments)    Lost control    Penicillins Other (See Comments)    Unknown reaction Did it involve swelling of the face/tongue/throat, SOB, or low BP? Unknown Did it involve sudden or severe rash/hives, skin peeling, or any reaction on the inside of your mouth or nose? Unknown Did you need to seek medical attention at a hospital or doctor's office? Yes When did it last happen?      Childhood allergy  If all above answers are "NO", may proceed with cephalosporin use.    Rosuvastatin Other (See Comments)    Myalgias     Patient Measurements: Height: 5\' 6"  (167.6 cm) Weight: 95.2 kg (209 lb 14.1 oz) IBW/kg (Calculated) : 63.8 Heparin Dosing Weight: 84.1 kg   Vital Signs: Temp: 98.2 F (36.8 C) (07/09 2338) Temp Source: Oral (07/09 2338) BP: 158/98 (07/09 2338) Pulse Rate: 92 (07/09 2338)  Labs: Recent Labs    01/10/22 2131 01/11/22 0002  HGB 16.7  --   HCT 47.6  --   PLT 305  --   CREATININE 1.26*  --   TROPONINIHS 137* 3,544*    Estimated Creatinine Clearance: 63.2 mL/min (A) (by C-G formula based on SCr of 1.26 mg/dL (H)).   Medical History: Past Medical History:  Diagnosis Date   Anginal pain (HCC)    Arthritis    CAD (coronary artery disease)    a. s/p MI with LAD and Diag stenting @ [name];  b. 06/2015 Cath: LAD 31m/d ISR, 100 RCA (ISR) w/ L->R collats, otw mod nonobs dzs-->Med Rx; c. 08/2019 Cath: LM nl, LAD 10p/m ISR, D1 20, D2 100, RI min irregs, LCX 40m/d, OM1 100, OM2 50, RCA 100p, 70d. RPDA fills via collats from LAD. EF  25-35%-->Med Rx; d. 10/2019 NSTEMI/Cath: LCX now 100 (2.75x15 Resolute Onyx DES), otw stable compared to 08/2019.   Chronic combined systolic and diastolic CHF (congestive heart failure) (HCC)    a. 06/2015 Echo: EF 20-25%, Gr3 DD; b. 10/2019 Echo: EF 25-30%, glob HK, sev inf/infapical HK. Mod dil LA.   Dyspnea    Essential hypertension    Headache 12/23/2021   Hyperlipidemia    Hypokalemia    a. 06/2015 in setting of diuresis.   Ischemic cardiomyopathy    a. 2011 EF 45% ([name]);  b. 06/2015 Echo: EF 20-25%; c. 04/2019 Echo: EF 40-45%; d. 08/2019 LV gram: EF 25-35%; e. 10/2019 Echo: EF 25-30%.   PVC's (premature ventricular contractions)    a. 09/2019 [facility]io (3 days): Avg hr 70, 4 runs NSVT, 5 runs SVT, rare PACs, frequent PVCs w/ 11.8% burden.   Sleep apnea    Stroke/Right temporal lobe infarction [facility])    a. 10/2019 MRI brain: 1cm acute ischemic nonhemorrhagic R temporal lobe infarct. Age-related cerebral atrophy w/ moderate chronic small vessel ischemic dzs.   Type 2 diabetes mellitus with hyperglycemia (HCC) 10/08/2019    Medications:  Medications Prior to Admission  Medication Sig Dispense Refill Last Dose   aspirin EC 81 MG tablet Take 1 tablet (81 mg total) by mouth daily.  Swallow whole. 17 tablet 12 Past Week at unknown   albuterol (PROVENTIL) (2.5 MG/3ML) 0.083% nebulizer solution Inhale 3 mLs into the lungs every 6 (six) hours as needed for wheezing or shortness of breath. (Patient not taking: Reported on 01/10/2022) 75 mL 12 Not Taking   Budeson-Glycopyrrol-Formoterol (BREZTRI AEROSPHERE) 160-9-4.8 MCG/ACT AERO Inhale 2 puffs into the lungs in the morning and at bedtime. (Patient not taking: Reported on 01/10/2022) 5.9 g 1 Not Taking   carvedilol (COREG) 3.125 MG tablet Take 1 tablet (3.125 mg total) by mouth 2 (two) times daily with a meal. (Patient not taking: Reported on 01/10/2022) 180 tablet 3 Not Taking   clopidogrel (PLAVIX) 75 MG tablet Take 1 tablet (75 mg total) by mouth once daily.  (Patient not taking: Reported on 01/10/2022) 90 tablet 3 Not Taking   empagliflozin (JARDIANCE) 10 MG TABS tablet Take 10 mg by mouth daily before breakfast. (Patient not taking: Reported on 01/10/2022)   Not Taking   ezetimibe (ZETIA) 10 MG tablet Take 1 tablet (10 mg total) by mouth daily. (Patient not taking: Reported on 01/10/2022) 30 tablet 1 Not Taking   furosemide (LASIX) 40 MG tablet Take 1 tablet (40 mg total) by mouth once daily as needed. (Patient not taking: Reported on 01/10/2022) 90 tablet 3 Not Taking   isosorbide mononitrate (IMDUR) 30 MG 24 hr tablet Take (1/2) tablet (15 mg total) by mouth once daily. (Patient not taking: Reported on 01/10/2022) 45 tablet 3 Not Taking   metFORMIN (GLUCOPHAGE) 500 MG tablet Take 1 tablet (500 mg total) by mouth 2 (two) times daily with a meal. (Patient not taking: Reported on 01/10/2022) 60 tablet 11 Not Taking   nitroGLYCERIN (NITROSTAT) 0.4 MG SL tablet Place 1 tablet (0.4 mg total) under the tongue every 5 (five) minutes as needed for chest pain. (Patient not taking: Reported on 01/10/2022) 90 tablet 3 Not Taking   ranolazine (RANEXA) 500 MG 12 hr tablet Take 1 tablet (500 mg total) by mouth 2 (two) times daily. (Patient not taking: Reported on 01/10/2022) 60 tablet 2 Not Taking   sacubitril-valsartan (ENTRESTO) 24-26 MG Take 1 tablet by mouth 2 (two) times daily. (Patient not taking: Reported on 01/10/2022) 60 tablet 3 Not Taking   spironolactone (ALDACTONE) 25 MG tablet Take 0.5 tablets (12.5 mg total) by mouth daily. (Patient not taking: Reported on 01/10/2022) 45 tablet 3 Not Taking    Assessment: Pharmacy consulted to dose heparin in this 66 year old male admitted with ACS/NSTEMI following being stung by ~ 15-20 yellow jackets.   Pt has hx of MI and stroke but no prior anticoag noted on PTA med list.   CrCl = 63.2 ml/min  Goal of Therapy:  Heparin level 0.3-0.7 units/ml Monitor platelets by anticoagulation protocol: Yes   Plan:  No bolus per cardiology.   Start heparin infusion at 1100 units/hr Check anti-Xa level in 6 hours and daily while on heparin Continue to monitor H&H and platelets  Rogers Ditter D 01/11/2022,1:28 AM

## 2022-01-11 NOTE — Plan of Care (Signed)

## 2022-01-12 ENCOUNTER — Other Ambulatory Visit: Payer: Self-pay

## 2022-01-12 DIAGNOSIS — I214 Non-ST elevation (NSTEMI) myocardial infarction: Secondary | ICD-10-CM

## 2022-01-12 DIAGNOSIS — Z8673 Personal history of transient ischemic attack (TIA), and cerebral infarction without residual deficits: Secondary | ICD-10-CM

## 2022-01-12 DIAGNOSIS — I248 Other forms of acute ischemic heart disease: Secondary | ICD-10-CM

## 2022-01-12 DIAGNOSIS — I472 Ventricular tachycardia, unspecified: Secondary | ICD-10-CM

## 2022-01-12 DIAGNOSIS — T63463A Toxic effect of venom of wasps, assault, initial encounter: Secondary | ICD-10-CM

## 2022-01-12 LAB — BASIC METABOLIC PANEL
Anion gap: 6 (ref 5–15)
BUN: 26 mg/dL — ABNORMAL HIGH (ref 8–23)
CO2: 21 mmol/L — ABNORMAL LOW (ref 22–32)
Calcium: 8.6 mg/dL — ABNORMAL LOW (ref 8.9–10.3)
Chloride: 110 mmol/L (ref 98–111)
Creatinine, Ser: 1.04 mg/dL (ref 0.61–1.24)
GFR, Estimated: >60 mL/min (ref >60)
Glucose, Bld: 242 mg/dL — ABNORMAL HIGH (ref 70–99)
Potassium: 3.8 mmol/L (ref 3.5–5.1)
Sodium: 137 mmol/L (ref 135–145)

## 2022-01-12 LAB — CBC
HCT: 40.6 % (ref 39.0–52.0)
Hemoglobin: 14.1 g/dL (ref 13.0–17.0)
MCH: 29.9 pg (ref 26.0–34.0)
MCHC: 34.7 g/dL (ref 30.0–36.0)
MCV: 86 fL (ref 80.0–100.0)
Platelets: 193 10*3/uL (ref 150–400)
RBC: 4.72 MIL/uL (ref 4.22–5.81)
RDW: 13.4 % (ref 11.5–15.5)
WBC: 17.5 10*3/uL — ABNORMAL HIGH (ref 4.0–10.5)
nRBC: 0 % (ref 0.0–0.2)

## 2022-01-12 LAB — MAGNESIUM: Magnesium: 2.1 mg/dL (ref 1.7–2.4)

## 2022-01-12 LAB — HEPARIN LEVEL (UNFRACTIONATED): Heparin Unfractionated: 0.51 IU/mL (ref 0.30–0.70)

## 2022-01-12 LAB — GLUCOSE, CAPILLARY
Glucose-Capillary: 219 mg/dL — ABNORMAL HIGH (ref 70–99)
Glucose-Capillary: 250 mg/dL — ABNORMAL HIGH (ref 70–99)

## 2022-01-12 LAB — PROCALCITONIN: Procalcitonin: <0.1 ng/mL

## 2022-01-12 MED ORDER — AMIODARONE HCL 200 MG PO TABS
ORAL_TABLET | ORAL | 0 refills | Status: DC
  Filled 2022-01-12: qty 100, 40d supply, fill #0

## 2022-01-12 MED ORDER — LIVING WELL WITH DIABETES BOOK
Freq: Once | Status: AC
  Filled 2022-01-12: qty 1

## 2022-01-12 NOTE — Progress Notes (Signed)
ANTICOAGULATION CONSULT NOTE  Pharmacy Consult for Heparin  Indication: chest pain/ACS  Allergies  Allergen Reactions   Contrast Media [Iodinated Contrast Media] Shortness Of Breath   Iohexol Shortness Of Breath     Onset Date: [id number]    Jardiance [Empagliflozin] Other (See Comments)    Fatigue/weakness   Atorvastatin Other (See Comments)    Myalgias    Hydrocodone Itching   Morphine And Related Other (See Comments)    Lost control    Penicillins Other (See Comments)    Unknown reaction Did it involve swelling of the face/tongue/throat, SOB, or low BP? Unknown Did it involve sudden or severe rash/hives, skin peeling, or any reaction on the inside of your mouth or nose? Unknown Did you need to seek medical attention at a hospital or doctor's office? Yes When did it last happen?      Childhood allergy  If all above answers are "NO", may proceed with cephalosporin use.    Rosuvastatin Other (See Comments)    Myalgias     Patient Measurements: Height: 5\' 6"  (167.6 cm) Weight: 95.2 kg (209 lb 14.1 oz) IBW/kg (Calculated) : 63.8 Heparin Dosing Weight: 84.1 kg   Vital Signs: Temp: 98.7 F (37.1 C) (07/11 0439) Temp Source: Oral (07/11 0003) BP: 142/80 (07/11 0439) Pulse Rate: 61 (07/11 0439)  Labs: Recent Labs    01/10/22 2131 01/11/22 0002 01/11/22 0208 01/11/22 0812 01/11/22 2016 01/12/22 0304  HGB 16.7  --  15.0  --   --  14.1  HCT 47.6  --  44.7  --   --  40.6  PLT 305  --  224  --   --  193  APTT  --   --  27  --   --   --   LABPROT  --   --  14.0  --   --   --   INR  --   --  1.1  --   --   --   HEPARINUNFRC  --   --   --  <0.10* 0.55 0.51  CREATININE 1.26*  --  1.16  --   --  1.04  TROPONINIHS 137* 3,544*  --   --   --   --      Estimated Creatinine Clearance: 76.5 mL/min (by C-G formula based on SCr of 1.04 mg/dL).   Medical History: Past Medical History:  Diagnosis Date   Anginal pain (HCC)    Arthritis    CAD (coronary artery disease)     a. s/p MI with LAD and Diag stenting @ [name];  b. 06/2015 Cath: LAD 74m/d ISR, 100 RCA (ISR) w/ L->R collats, otw mod nonobs dzs-->Med Rx; c. 08/2019 Cath: LM nl, LAD 10p/m ISR, D1 20, D2 100, RI min irregs, LCX 46m/d, OM1 100, OM2 50, RCA 100p, 70d. RPDA fills via collats from LAD. EF 25-35%-->Med Rx; d. 10/2019 NSTEMI/Cath: LCX now 100 (2.75x15 Resolute Onyx DES), otw stable compared to 08/2019.   Chronic combined systolic and diastolic CHF (congestive heart failure) (HCC)    a. 06/2015 Echo: EF 20-25%, Gr3 DD; b. 10/2019 Echo: EF 25-30%, glob HK, sev inf/infapical HK. Mod dil LA.   Dyspnea    Essential hypertension    Headache 12/23/2021   Hyperlipidemia    Hypokalemia    a. 06/2015 in setting of diuresis.   Ischemic cardiomyopathy    a. 2011 EF 45% ([name]);  b. 06/2015 Echo: EF 20-25%; c. 04/2019 Echo: EF 40-45%; d. 08/2019 LV gram:  EF 25-35%; e. 10/2019 Echo: EF 25-30%.   PVC's (premature ventricular contractions)    a. 09/2019 Zio (3 days): Avg hr 70, 4 runs NSVT, 5 runs SVT, rare PACs, frequent PVCs w/ 11.8% burden.   Sleep apnea    Stroke/Right temporal lobe infarction [facility])    a. 10/2019 MRI brain: 1cm acute ischemic nonhemorrhagic R temporal lobe infarct. Age-related cerebral atrophy w/ moderate chronic small vessel ischemic dzs.   Type 2 diabetes mellitus with hyperglycemia (HCC) 10/08/2019    Medications:  Medications Prior to Admission  Medication Sig Dispense Refill Last Dose   aspirin EC 81 MG tablet Take 1 tablet (81 mg total) by mouth daily. Swallow whole. 17 tablet 12 Past Week at unknown   albuterol (PROVENTIL) (2.5 MG/3ML) 0.083% nebulizer solution Inhale 3 mLs into the lungs every 6 (six) hours as needed for wheezing or shortness of breath. (Patient not taking: Reported on 01/10/2022) 75 mL 12 Not Taking   Budeson-Glycopyrrol-Formoterol (BREZTRI AEROSPHERE) 160-9-4.8 MCG/ACT AERO Inhale 2 puffs into the lungs in the morning and at bedtime. (Patient not taking: Reported on 01/10/2022) 5.9  g 1 Not Taking   carvedilol (COREG) 3.125 MG tablet Take 1 tablet (3.125 mg total) by mouth 2 (two) times daily with a meal. (Patient not taking: Reported on 01/10/2022) 180 tablet 3 Not Taking   clopidogrel (PLAVIX) 75 MG tablet Take 1 tablet (75 mg total) by mouth once daily. (Patient not taking: Reported on 01/10/2022) 90 tablet 3 Not Taking   empagliflozin (JARDIANCE) 10 MG TABS tablet Take 10 mg by mouth daily before breakfast. (Patient not taking: Reported on 01/10/2022)   Not Taking   ezetimibe (ZETIA) 10 MG tablet Take 1 tablet (10 mg total) by mouth daily. (Patient not taking: Reported on 01/10/2022) 30 tablet 1 Not Taking   furosemide (LASIX) 40 MG tablet Take 1 tablet (40 mg total) by mouth once daily as needed. (Patient not taking: Reported on 01/10/2022) 90 tablet 3 Not Taking   isosorbide mononitrate (IMDUR) 30 MG 24 hr tablet Take (1/2) tablet (15 mg total) by mouth once daily. (Patient not taking: Reported on 01/10/2022) 45 tablet 3 Not Taking   metFORMIN (GLUCOPHAGE) 500 MG tablet Take 1 tablet (500 mg total) by mouth 2 (two) times daily with a meal. (Patient not taking: Reported on 01/10/2022) 60 tablet 11 Not Taking   nitroGLYCERIN (NITROSTAT) 0.4 MG SL tablet Place 1 tablet (0.4 mg total) under the tongue every 5 (five) minutes as needed for chest pain. (Patient not taking: Reported on 01/10/2022) 90 tablet 3 Not Taking   ranolazine (RANEXA) 500 MG 12 hr tablet Take 1 tablet (500 mg total) by mouth 2 (two) times daily. (Patient not taking: Reported on 01/10/2022) 60 tablet 2 Not Taking   sacubitril-valsartan (ENTRESTO) 24-26 MG Take 1 tablet by mouth 2 (two) times daily. (Patient not taking: Reported on 01/10/2022) 60 tablet 3 Not Taking   spironolactone (ALDACTONE) 25 MG tablet Take 0.5 tablets (12.5 mg total) by mouth daily. (Patient not taking: Reported on 01/10/2022) 45 tablet 3 Not Taking  Heparin Dosing Weight: 84.1 kg   Assessment: Pharmacy consulted to dose heparin in this 66 year old male  admitted with ACS/NSTEMI following being stung by ~ 15-20 yellow jackets.   Pt has hx of MI and stroke but no prior anticoag noted on PTA med list.   CrCl = 63.2 ml/min  Date Time  HL Rate/Comment 0710 0812 <0.1 Subtherapeutic; 1100 > 1600 un/hr 0710 2016 0.55 Therapeutic; 1600  un/hr 0711 0304 0.51 Therapeutic x 1  Baseline Labs: aPTT - 27s INR - 1.1 Hgb - 16.7>15 Plts - 305>224 Trop - 678>9381  Goal of Therapy:  Heparin level 0.3-0.7 units/ml Monitor platelets by anticoagulation protocol: Yes   Plan:  HL therapeutic x 2 at current rate Continue heparin infusion rate at 1600 units/hr Recheck HL daily w/ AM labs while therapeutic Continue to monitor H&H and platelets  Otelia Sergeant, PharmD, [facility] 01/12/2022 4:42 AM

## 2022-01-12 NOTE — Plan of Care (Signed)
  Problem: Education: Goal: Understanding of CV disease, CV risk reduction, and recovery process will improve Outcome: Progressing Goal: Individualized Educational Video(s) Outcome: Progressing   Problem: Activity: Goal: Ability to return to baseline activity level will improve Outcome: Progressing   Problem: Cardiovascular: Goal: Ability to achieve and maintain adequate cardiovascular perfusion will improve Outcome: Progressing Goal: Vascular access site(s) Level 0-1 will be maintained Outcome: Progressing   Problem: Health Behavior/Discharge Planning: Goal: Ability to safely manage health-related needs after discharge will improve Outcome: Progressing   Problem: Education: Goal: Knowledge of General Education information will improve Description: Including pain rating scale, medication(s)/side effects and non-pharmacologic comfort measures Outcome: Progressing   Problem: Health Behavior/Discharge Planning: Goal: Ability to manage health-related needs will improve Outcome: Progressing   Problem: Clinical Measurements: Goal: Ability to maintain clinical measurements within normal limits will improve Outcome: Progressing Goal: Will remain free from infection Outcome: Progressing Goal: Diagnostic test results will improve Outcome: Progressing Goal: Respiratory complications will improve Outcome: Progressing Goal: Cardiovascular complication will be avoided Outcome: Progressing   Problem: Activity: Goal: Risk for activity intolerance will decrease Outcome: Progressing   Problem: Nutrition: Goal: Adequate nutrition will be maintained Outcome: Progressing   Problem: Coping: Goal: Level of anxiety will decrease Outcome: Progressing   Problem: Elimination: Goal: Will not experience complications related to bowel motility Outcome: Progressing Goal: Will not experience complications related to urinary retention Outcome: Progressing   Problem: Pain Managment: Goal:  General experience of comfort will improve Outcome: Progressing   Problem: Safety: Goal: Ability to remain free from injury will improve Outcome: Progressing   Problem: Skin Integrity: Goal: Risk for impaired skin integrity will decrease Outcome: Progressing   Problem: Education: Goal: Ability to describe self-care measures that may prevent or decrease complications (Diabetes Survival Skills Education) will improve Outcome: Progressing Goal: Individualized Educational Video(s) Outcome: Progressing   Problem: Coping: Goal: Ability to adjust to condition or change in health will improve Outcome: Progressing   Problem: Fluid Volume: Goal: Ability to maintain a balanced intake and output will improve Outcome: Progressing   Problem: Health Behavior/Discharge Planning: Goal: Ability to identify and utilize available resources and services will improve Outcome: Progressing Goal: Ability to manage health-related needs will improve Outcome: Progressing   Problem: Metabolic: Goal: Ability to maintain appropriate glucose levels will improve Outcome: Progressing   Problem: Nutritional: Goal: Maintenance of adequate nutrition will improve Outcome: Progressing Goal: Progress toward achieving an optimal weight will improve Outcome: Progressing   Problem: Skin Integrity: Goal: Risk for impaired skin integrity will decrease Outcome: Progressing   Problem: Tissue Perfusion: Goal: Adequacy of tissue perfusion will improve Outcome: Progressing   

## 2022-01-12 NOTE — Progress Notes (Signed)
PHARMACY CONSULT NOTE - FOLLOW UP  Pharmacy Consult for Electrolyte Monitoring and Replacement   Recent Labs: Potassium (mmol/L)  Date Value  01/12/2022 3.8   Magnesium (mg/dL)  Date Value  83/72/9021 2.1   Calcium (mg/dL)  Date Value  11/55/2080 8.6 (L)   Albumin (g/dL)  Date Value  22/33/6122 4.0   Sodium (mmol/L)  Date Value  01/12/2022 137     Assessment: 66 y.o with male significant PMH of  CAD s/p multiple PCI's, CTO RCA, Ischemic Cardiomyopathy with EF 30 to 35 %, former smoker x 20 + packs, HTN, HLD, T2DM, and recent CVA who presented to the ED with chief complaints of chest discomfort following multiple wasp bites. Pharmacy is asked to follow and replace electrolytes while in CCU  Goal of Therapy:  Potassium 4.0 - 5.1 mmol/L Magnesium 20. - 2.4 mg/dL All Other Electrolytes WNL  Plan:  --no electrolyte replacement warranted for today -- Pharmacy will sign off on electrolyte consult and monitor peripherally. Please reconsult if replacement needed.   Thank you for allowing pharmacy to be a part of this patient's care.  Selinda Eon, Clinical Pharmacist 01/12/2022 8:24 AM

## 2022-01-12 NOTE — Progress Notes (Signed)
Progress Note  Patient Name: [name]. Date of Encounter: 01/12/2022  [facility] Cardiologist: Kathlyn Sacramento, MD   Subjective   Patient seen on AM rounds. Denies any chest pain, shortness of breath, or palpitations.   Inpatient Medications    Scheduled Meds:  amiodarone  400 mg Oral BID   aspirin EC  81 mg Oral Daily   carvedilol  3.125 mg Oral BID WC   clopidogrel  75 mg Oral Daily   insulin aspart  0-20 Units Subcutaneous Q4H   methylPREDNISolone (SOLU-MEDROL) injection  40 mg Intravenous Q12H   Continuous Infusions:  heparin Stopped (01/12/22 1100)   PRN Meds: docusate sodium, labetalol, ondansetron (ZOFRAN) IV, polyethylene glycol   Vital Signs    Vitals:   01/12/22 0003 01/12/22 0439 01/12/22 0500 01/12/22 0731  BP: (!) 152/76 (!) 142/80  (!) 143/87  Pulse: 72 61  (!) 55  Resp: $Remo'18 18  16  'PEIiW$ Temp: 97.6 F (36.4 C) 98.7 F (37.1 C)  97.9 F (36.6 C)  TempSrc: Oral   Oral  SpO2: 98% 98%  95%  Weight:   96.2 kg   Height:        Intake/Output Summary (Last 24 hours) at 01/12/2022 1218 Last data filed at 01/12/2022 0800 Gross per 24 hour  Intake 998.12 ml  Output 301 ml  Net 697.12 ml      01/12/2022    5:00 AM 01/10/2022   11:38 PM 01/10/2022    9:34 PM  Last 3 Weights  Weight (lbs) 212 lb 1.3 oz 209 lb 14.1 oz 207 lb 10.8 oz  Weight (kg) 96.2 kg 95.2 kg 94.2 kg      Telemetry    Sinus bradycardia/SR with unifocal PVC's rates 50-60's- Personally Reviewed  ECG    No new tracings - Personally Reviewed  Physical Exam   GEN: No acute distress.   Neck: No JVD Cardiac: RRR, sinus bradycardia,  no murmurs, rubs, or gallops.  Respiratory: Clear to auscultation bilaterally. Respirations are unlabored at ret on room air. GI: Soft, nontender, non-distended  MS: No edema; No deformity. Neuro:  Nonfocal  Psych: Normal affect   Labs    High Sensitivity Troponin:   Recent Labs  Lab 12/23/21 1235 12/23/21 1600 12/23/21 2004 01/10/22 2131  01/11/22 0002  TROPONINIHS 21* 22* 32* 137* 3,544*     Chemistry Recent Labs  Lab 01/10/22 2131 01/11/22 0208 01/12/22 0304  NA 142 138 137  K 4.2 4.6 3.8  CL 111 111 110  CO2 19* 21* 21*  GLUCOSE 266* 250* 242*  BUN 23 22 26*  CREATININE 1.26* 1.16 1.04  CALCIUM 9.7 8.9 8.6*  MG  --  2.1 2.1  GFRNONAA >60 >60 >60  ANIONGAP $RemoveB'12 6 6    'UkxCIVSE$ Lipids No results for input(s): "CHOL", "TRIG", "HDL", "LABVLDL", "LDLCALC", "CHOLHDL" in the last 168 hours.  Hematology Recent Labs  Lab 01/10/22 2131 01/11/22 0208 01/12/22 0304  WBC 17.7* 16.8* 17.5*  RBC 5.58 5.05 4.72  HGB 16.7 15.0 14.1  HCT 47.6 44.7 40.6  MCV 85.3 88.5 86.0  MCH 29.9 29.7 29.9  MCHC 35.1 33.6 34.7  RDW 13.4 13.8 13.4  PLT 305 224 193   Thyroid No results for input(s): "TSH", "FREET4" in the last 168 hours.  BNP Recent Labs  Lab 01/11/22 0032  BNP 301.3*    DDimer No results for input(s): "DDIMER" in the last 168 hours.   Radiology    DG Chest 1 View  Result Date: 01/11/2022 CLINICAL DATA:  Show shortness of breath EXAM: CHEST  1 VIEW COMPARISON:  12/30/2021 FINDINGS: Heart and mediastinal contours are within normal limits. No focal opacities or effusions. No acute bony abnormality. IMPRESSION: No active disease. Electronically Signed   By: Rolm Baptise [name]   On: 01/11/2022 00:34    Cardiac Studies   L/R heart cath 12/30/21 Conclusions: Severe multivessel coronary artery disease, overall relatively similar to most recent cath in 2021.  Mid LCx stent placed at that time demonstrates mild in-stent restenosis in the proximal segment.  Eccentric ostial OM2 lesion appears similar. Mildly elevated left heart and pulmonary artery pressures. Mildly reduced Fick cardiac output/index.   Recommendations: Continue indefinite DAPT with aspirin and clopidogrel. Continue gentle diuresis; I will add back furosemide 40 mg daily.  Escalate GDMT for HFrEF, as blood pressure and renal function allow. Add ranolazine  500 mg BID for antianginal therapy.  EKG to be obtained tomorrow AM to reassess QT interval. Consider pulmonary consultation, as degree of dyspnea seems to be out of proportion to heart failure/coronary artery disease. If the patient has refractory symptoms in spite of aforementioned interventions, PCI to OM2 may need to be considered.  If possible, this should be deferred for at least 2 weeks from the time of recent brain MRI demonstrating acute stroke in order to minimize risk for periprocedural intracranial hemorrhage. Aggressive secondary prevention of coronary artery disease.   Nelva Bush, MD [facility]   Coronary Diagrams   Diagnostic Dominance: Right        Echo 12/24/21  1. Left ventricular ejection fraction, by estimation, is 30 to 35%. The  left ventricle has moderately decreased function. The left ventricle  demonstrates global hypokinesis. Left ventricular diastolic parameters are  consistent with Grade I diastolic  dysfunction (impaired relaxation).   2. Right ventricular systolic function is normal. The right ventricular  size is normal. Tricuspid regurgitation signal is inadequate for assessing  PA pressure.   3. The mitral valve is normal in structure. Mild mitral valve  regurgitation. No evidence of mitral stenosis.   4. The aortic valve is normal in structure. Aortic valve regurgitation is  not visualized. Aortic valve sclerosis is present, with no evidence of  aortic valve stenosis.     Patient Profile     66 y.o. male with a history of CAD status post multiple PCI's, CTO RCA, HFrEF, ischemic cardiomyopathy with LVEF of 30 to 35%, former smoker, essential hypertension, hyperlipidemia, diabetes type 2, PVCs, recent CVA, and medication noncompliance who is being seen for the evaluation of chest discomfort and ventricular tachycardia.  Assessment & Plan    Ventricular tachycardia, HFrEF, and ICM LVEF 30 to 35% -Patient presented with VT after multiple  yellowjacket stings -Status post cardioversion x2 and amiodarone bolus x2 with conversion to normal sinus rhythm -Treated for anaphylaxis -Noted noncompliance of cardiac medications since previous discharge with inability to afford medications -PTA medications include carvedilol 3.125 mg twice daily, Jardiance 10 mg daily, Imdur 30 mg daily, Ranexa 500 mg twice daily, spironolactone 12.5 mg daily, Lasix 40 mg daily of note he was previously on Entresto which was held restart when able -This morning he remains in normal sinus rhythm with PVCs -Last heart catheterization was completed 6/28 -For repeat heart catheterization -Discussion with the EP led to the recommendation of continue treatment with amiodarone if no recurrent ventricular tachycardia on medical therapy patient can be discharged once stabilized with plans for outpatient EP consultation  Elevated high-sensitivity  troponin, NSTEMI, CAD status post multiple PCI's -Remains chest pain-free -Recent heart catheterization on 6/28 -SPECT with elevated troponins demand ischemia from ventricular tachycardia in the setting of known CAD and acute anaphylaxis -No plan for repeat heart catheterization at this time -Continue aspirin and Plavix -Restart PTA Coreg/Imdur/Ranexa  AKI on CKD -Serum creatinine 1.04/BUN 26 this a.m. -Replace electrolytes as indicated -Up 417 mL in the last 24 hours -Daily BMP  Recent CVA -Continue Plavix -Continue aspirin      For questions or updates, please contact [facility] Please consult [email or web address] for contact info under        Signed, Dereka Lueras, NP  01/12/2022, 12:18 PM

## 2022-01-12 NOTE — Inpatient Diabetes Management (Signed)
Inpatient Diabetes Program Recommendations  AACE/ADA: New Consensus Statement on Inpatient Glycemic Control   Target Ranges:  Prepandial:   less than 140 mg/dL      Peak postprandial:   less than 180 mg/dL (1-2 hours)      Critically ill patients:  140 - 180 mg/dL    Latest Reference Range & Units 01/11/22 07:19 01/11/22 11:25 01/11/22 15:51 01/11/22 20:30 01/12/22 00:05 01/12/22 04:40  Glucose-Capillary 70 - 99 mg/dL 268 (H) 237 (H) 305 (H) 209 (H) 250 (H) 219 (H)    Latest Reference Range & Units 10/07/19 04:26 10/10/19 04:20 06/18/21 04:29 12/23/21 20:04  Hemoglobin A1C 4.8 - 5.6 % 7.1 (H) 7.3 (H) 7.4 (H) 7.5 (H)   Review of Glycemic Control  Diabetes history: DM2 Outpatient Diabetes medications: Metformin 500 mg BID (not taking) Current orders for Inpatient glycemic control: Novolog 0-20 units Q4H; Solumedrol 40 mg Q12H  Inpatient Diabetes Program Recommendations:    Insulin: If steroids are continued as ordered, please consider ordering Semglee 20 units Q24H.   Outpatient DM medication: Patient states that he has never taken Metformin (despite it being prescribed) and that he use to take Jardiance but thought it was for his heart (stopped because it made him feel bad). At time of discharge, please prescribe affordable DM medication.  NOTE: Patient admitted with Sustained V. tach following wasp bite with associated anaphylaxis reaction. Per chart, patient has hx of DM2 and use to be prescribed Jardiance 10 mg daily (prescribed by PCP on 07/21/21) and Metformin 500 mg BID. Per chart, noted patient seen PCP 08/20/21 and reported that he stopped Jardiance and Metformin because he felt so bad after starting the Jardiance. Patient was inpatient 12/23/21-12/31/21 and at time of discharged was asked to restart Metformin 500 mg BID. Spoke with patient over the phone to inquire about DM. Patient states that he has never taken medication for DM. Discussed that he has been prescribed Metformin and  Jardiance for DM. Patient states that he has never taken Metformin and that he though the Jardiance was for his heart. Discussed Metformin and Jardiance and how they work and explained that Vania Rea has been shown to help with CHF as well. Patient states that he does not check glucose and does not have a glucometer at home. Discussed glucose monitoring, glucose goals, and that he will need to start checking glucose at home. Inquired about knowledge of an A1C and patient states he does not know what an A1C is. Explained what an A1C is and that his most current A1C was 7.5% on 12/23/21 indicating an average glucose of 169 mg/dl over the prior 2-3 months. Discussed A1C goal of 7% or less.  Patient reports that he has not seen his PCP in years and that he sees [name], [place] with Cardiology frequently. Discussed basic pathophysiology of DM Type 2, basic home care, importance of checking CBGs and maintaining good CBG control to prevent long-term and short-term complications. Discussed impact of nutrition, exercise, stress, sickness, and medications on diabetes control. Informed patient that he is currently ordered steroids which is contributing to hyperglycemia. Patient does not follow a carb modified diet currently and reports he mainly drinks water. Discussed Carb Modified diet and encouraged patient to start following.  Will consult RD for further diet education.  Informed patient that a Living Well with diabetes booklet would be ordered and encouraged patient to read through entire book. Informed patient that he will need to take prescribed DM medication consistently and asked  that he pay attention to discharge paperwork to see what medication he is prescribed for DM. Encouraged patient to follow up with is PCP regarding DM control.  Patient verbalized understanding of information discussed and he states that he has no further questions at this time related to diabetes.  Ordered TOC consult to assist with meds if  needed and to provide glucose monitoring kit. Ordered RD consult for further diet education on Carb Modified diet. Ordered Living Well with DM book.  Thanks, Barnie Alderman, RN, MSN, CDE Diabetes Coordinator Inpatient Diabetes Program [phone] (Team Pager from 8am to 5pm)

## 2022-01-12 NOTE — Discharge Summary (Signed)
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 matter, likely secondary to chronic microvascular ischemic changes. Small old infarct in the left posterior periventricular white matter. Vascular: No hyperdense vessel or unexpected calcification. Skull: Normal. Negative for fracture or focal lesion. Sinuses/Orbits: No acute finding. Chronic moderate mucosal opacification of the left sphenoid sinus. Other: None. IMPRESSION: Chronic changes as described with no acute intracranial process identified. Electronically Signed   By: Ofilia Neas [name]   On: 12/23/2021 11:44   DG Chest 2 View  Result Date: 12/23/2021 CLINICAL DATA:  Chest pain, dizziness, and shortness of breath. EXAM: CHEST - 2 VIEW COMPARISON:  Chest radiograph 06/17/2021 FINDINGS: The cardiomediastinal silhouette is unchanged with upper limits of normal heart size and mild chronic prominence of the central pulmonary arteries. Mild scarring is noted in the right middle lobe. No acute airspace consolidation,  edema, pleural effusion, or pneumothorax is identified. No acute osseous  abnormality is seen. IMPRESSION: No active cardiopulmonary disease. Electronically Signed   By: Sebastian Ache [name]   On: 12/23/2021 10:24    Microbiology: Results for orders placed or performed during the hospital encounter of 01/10/22  MRSA Next Gen by PCR, Nasal     Status: None   Collection Time: 01/10/22 11:55 PM   Specimen: Nasal Mucosa; Nasal Swab  Result Value Ref Range Status   MRSA by PCR Next Gen NOT DETECTED NOT DETECTED Final    Comment: (NOTE) The GeneXpert MRSA Assay (FDA approved for NASAL specimens only), is one component of a comprehensive MRSA colonization surveillance program. It is not intended to diagnose MRSA infection nor to guide or monitor treatment for MRSA infections. Test performance is not FDA approved in patients less than 76 years old. Performed at [facility], [address] [place], [place] [address]   Culture, blood (Routine X 2) w Reflex to ID Panel     Status: None (Preliminary result)   Collection Time: 01/11/22  1:59 AM   Specimen: BLOOD  Result Value Ref Range Status   Specimen Description BLOOD LEFT ASSIST CONTROL  Final   Special Requests   Final    BOTTLES DRAWN AEROBIC AND ANAEROBIC Blood Culture adequate volume   Culture   Final    NO GROWTH 1 DAY Performed at [facility], [address], [place], [place] [address]    Report Status PENDING  Incomplete  Culture, blood (Routine X 2) w Reflex to ID Panel     Status: None (Preliminary result)   Collection Time: 01/11/22  2:08 AM   Specimen: BLOOD  Result Value Ref Range Status   Specimen Description BLOOD LEFT FOREARM  Final   Special Requests   Final    BOTTLES DRAWN AEROBIC AND ANAEROBIC Blood Culture adequate volume   Culture   Final    NO GROWTH 1 DAY Performed at [facility], [address] [place], [place] [address]    Report Status PENDING  Incomplete    Labs: CBC: Recent Labs  Lab 01/10/22 2131 01/11/22 0208 01/12/22 0304  WBC 17.7*  16.8* 17.5*  NEUTROABS 13.7*  --   --   HGB 16.7 15.0 14.1  HCT 47.6 44.7 40.6  MCV 85.3 88.5 86.0  PLT 305 224 193   Basic Metabolic Panel: Recent Labs  Lab 01/10/22 2131 01/11/22 0208 01/12/22 0304  NA 142 138 137  K 4.2 4.6 3.8  CL 111 111 110  CO2 19* 21* 21*  GLUCOSE 266* 250* 242*  BUN 23 22 26*  CREATININE 1.26* 1.16 1.04  CALCIUM 9.7 8.9 8.6*  MG  --  2.1 2.1   Liver Function Tests: No results for input(s): "AST", "ALT", "ALKPHOS", "BILITOT", "PROT", "ALBUMIN" in the last 168 hours. CBG: Recent Labs  Lab 01/11/22 1125 01/11/22 1551 01/11/22 2030 01/12/22 0005 01/12/22 0440  GLUCAP 237* 305* 209* 250* 219*    Discharge time spent: greater than 30 minutes.  Signed: Marrion Coy, MD [facility] 01/12/2022

## 2022-01-12 NOTE — TOC Transition Note (Signed)
Transition of Care [facility]) - CM/SW Discharge Note   Patient Details  Name: [name]. MRN: [id number] Date of Birth: December 22, 1955  [facility] CM/SW Contact:  Alberteen Sam, LCSW Phone Number: 01/12/2022, 11:30 AM   Clinical Narrative:     Patient to dc home today reports he has a ride, meds to be sent to medication management due to no insurance. Patient provided glucose monitoring kit from [facility].   Patient identifies no further dc needs.    Final next level of care: Home/Self Care Barriers to Discharge: No Barriers Identified   Patient Goals and CMS Choice Patient states their goals for this hospitalization and ongoing recovery are:: to go home CMS Medicare.gov Compare Post Acute Care list provided to:: Patient Choice offered to / list presented to : Patient  Discharge Placement                       Discharge Plan and Services                                     Social Determinants of Health (SDOH) Interventions     Readmission Risk Interventions     No data to display

## 2022-01-14 ENCOUNTER — Telehealth: Payer: Self-pay | Admitting: *Deleted

## 2022-01-14 DIAGNOSIS — I472 Ventricular tachycardia, unspecified: Secondary | ICD-10-CM

## 2022-01-14 NOTE — Telephone Encounter (Signed)
Orders placed for EP referral for evaluation of ventricular tachycardia

## 2022-01-14 NOTE — Telephone Encounter (Signed)
-----   Message from Dalia Heading sent at 01/14/2022 10:01 AM EDT ----- Regarding: FW: Hospital follow-up Can you place referral for Dr. Lalla Brothers.  Thanks. ----- Message ----- From: Joline Maxcy Sent: 01/12/2022   2:26 PM EDT To: Dalia Heading Subject: FW: Hospital follow-up                          ----- Message ----- From: Yvonne Kendall, MD Sent: 01/12/2022   1:14 PM EDT To: [facility] Triage; [facility] Scheduling Subject: Hospital follow-up                             Hello,  Could you help schedule Mr. Henson for follow-up with Dr. Kirke Corin or an APP in approximately 2 weeks.  Also, can he be referred to EP for evaluation of ventricular tachycardia?  Thanks.  Chris End

## 2022-01-15 ENCOUNTER — Encounter: Payer: Self-pay | Admitting: Family

## 2022-01-15 ENCOUNTER — Ambulatory Visit: Payer: Medicaid Other | Attending: Family | Admitting: Family

## 2022-01-15 ENCOUNTER — Other Ambulatory Visit: Payer: Self-pay | Admitting: Pharmacy Technician

## 2022-01-15 ENCOUNTER — Other Ambulatory Visit: Payer: Self-pay

## 2022-01-15 VITALS — BP 151/90 | HR 75 | Resp 18 | Ht 66.0 in | Wt 214.0 lb

## 2022-01-15 DIAGNOSIS — E785 Hyperlipidemia, unspecified: Secondary | ICD-10-CM | POA: Insufficient documentation

## 2022-01-15 DIAGNOSIS — J449 Chronic obstructive pulmonary disease, unspecified: Secondary | ICD-10-CM | POA: Insufficient documentation

## 2022-01-15 DIAGNOSIS — I2510 Atherosclerotic heart disease of native coronary artery without angina pectoris: Secondary | ICD-10-CM | POA: Insufficient documentation

## 2022-01-15 DIAGNOSIS — I10 Essential (primary) hypertension: Secondary | ICD-10-CM | POA: Diagnosis not present

## 2022-01-15 DIAGNOSIS — I252 Old myocardial infarction: Secondary | ICD-10-CM | POA: Insufficient documentation

## 2022-01-15 DIAGNOSIS — Z87891 Personal history of nicotine dependence: Secondary | ICD-10-CM | POA: Insufficient documentation

## 2022-01-15 DIAGNOSIS — I5022 Chronic systolic (congestive) heart failure: Secondary | ICD-10-CM | POA: Insufficient documentation

## 2022-01-15 DIAGNOSIS — I110 Hypertensive heart disease with heart failure: Secondary | ICD-10-CM | POA: Diagnosis present

## 2022-01-15 DIAGNOSIS — E876 Hypokalemia: Secondary | ICD-10-CM | POA: Diagnosis not present

## 2022-01-15 DIAGNOSIS — I25119 Atherosclerotic heart disease of native coronary artery with unspecified angina pectoris: Secondary | ICD-10-CM | POA: Diagnosis not present

## 2022-01-15 DIAGNOSIS — I472 Ventricular tachycardia, unspecified: Secondary | ICD-10-CM | POA: Insufficient documentation

## 2022-01-15 DIAGNOSIS — Z8673 Personal history of transient ischemic attack (TIA), and cerebral infarction without residual deficits: Secondary | ICD-10-CM | POA: Diagnosis not present

## 2022-01-15 DIAGNOSIS — Z7982 Long term (current) use of aspirin: Secondary | ICD-10-CM | POA: Diagnosis not present

## 2022-01-15 DIAGNOSIS — G8929 Other chronic pain: Secondary | ICD-10-CM | POA: Insufficient documentation

## 2022-01-15 MED ORDER — SPIRONOLACTONE 25 MG PO TABS
12.5000 mg | ORAL_TABLET | Freq: Every day | ORAL | 3 refills | Status: DC
  Filled 2022-01-15: qty 45, 90d supply, fill #0

## 2022-01-15 MED ORDER — NITROGLYCERIN 0.4 MG SL SUBL
0.4000 mg | SUBLINGUAL_TABLET | SUBLINGUAL | 3 refills | Status: DC | PRN
  Filled 2022-01-15: qty 25, 5d supply, fill #0
  Filled 2022-06-15: qty 25, 5d supply, fill #1

## 2022-01-15 MED ORDER — EZETIMIBE 10 MG PO TABS
10.0000 mg | ORAL_TABLET | Freq: Every day | ORAL | 3 refills | Status: DC
  Filled 2022-01-15: qty 90, 90d supply, fill #0

## 2022-01-15 MED ORDER — ISOSORBIDE MONONITRATE ER 30 MG PO TB24
15.0000 mg | ORAL_TABLET | Freq: Every day | ORAL | 3 refills | Status: DC
  Filled 2022-01-15: qty 45, 90d supply, fill #0

## 2022-01-15 MED ORDER — METFORMIN HCL 500 MG PO TABS
500.0000 mg | ORAL_TABLET | Freq: Two times a day (BID) | ORAL | 3 refills | Status: DC
  Filled 2022-01-15 (×2): qty 180, 90d supply, fill #0

## 2022-01-15 MED ORDER — CLOPIDOGREL BISULFATE 75 MG PO TABS
75.0000 mg | ORAL_TABLET | Freq: Every day | ORAL | 3 refills | Status: DC
Start: 2022-01-15 — End: 2022-01-26
  Filled 2022-01-15: qty 90, 90d supply, fill #0

## 2022-01-15 MED ORDER — ASPIRIN 81 MG PO TBEC
81.0000 mg | DELAYED_RELEASE_TABLET | Freq: Every day | ORAL | 3 refills | Status: DC
  Filled 2022-01-15: qty 90, 90d supply, fill #0
  Filled 2022-06-15: qty 90, 90d supply, fill #1

## 2022-01-15 MED ORDER — CARVEDILOL 3.125 MG PO TABS
3.1250 mg | ORAL_TABLET | Freq: Two times a day (BID) | ORAL | 3 refills | Status: DC
  Filled 2022-01-15 (×2): qty 180, 90d supply, fill #0

## 2022-01-15 MED ORDER — FUROSEMIDE 40 MG PO TABS
40.0000 mg | ORAL_TABLET | Freq: Every day | ORAL | 3 refills | Status: DC | PRN
  Filled 2022-01-15 (×2): qty 90, 90d supply, fill #0
  Filled 2022-06-15: qty 90, 90d supply, fill #1

## 2022-01-15 MED ORDER — SACUBITRIL-VALSARTAN 24-26 MG PO TABS
1.0000 | ORAL_TABLET | Freq: Two times a day (BID) | ORAL | 3 refills | Status: DC
Start: 2022-01-15 — End: 2022-03-03
  Filled 2022-01-15: qty 60, 30d supply, fill #0
  Filled 2022-01-15: qty 180, 90d supply, fill #0

## 2022-01-15 NOTE — Patient Outreach (Signed)
Patient only signed DOH Attestation.  Would need to provide current year's household income if PAP medications were needed.  Ephraim Reichel J. Rohail Klees Patient Advocate Specialist [facility]  

## 2022-01-15 NOTE — Patient Instructions (Signed)
Continue weighing daily and call for an overnight weight gain of 3 pounds or more or a weekly weight gain of more than 5 pounds.   If you have voicemail, please make sure your mailbox is cleaned out so that we may leave a message and please make sure to listen to any voicemails.     

## 2022-01-15 NOTE — Progress Notes (Signed)
Patient ID: Glen Flemings., male    DOB: Dec 21, 1955, 66 y.o.   MRN: [id number]  HPI  [name] is a 66 y/o male with a history of CAD, HTN, hyperlipidemia, stroke, hypokalemia, previous tobacco use and chronic heart failure.   Echo report from 12/23/21 reviewed and showed an EF of 30-35% along with mild Glen. Echo report from 06/19/21 reviewed and showed an EF of 30-35% along with moderate LVH and mild/moderate Glen. Echo report from 05/04/2019 reviewed and showed an EF of 40-45%. Echo report from 04/22/20 reviewed and showed an EF of 25-30% along with mild/moderate Glen. Echo report from 10/07/19 reviewed and showed an EF of 25-30%. Echo report from 05/04/2019 reviewed and showed an EF of 40-45% along with mild Glen. Echo report from 06/07/15 reviewed and showed an EF of 20-25% along with moderate Glen and mild TR.   RHC done 12/30/21 showed: Severe multivessel coronary artery disease, overall relatively similar to most recent cath in 2021.  Mid LCx stent placed at that time demonstrates mild in-stent restenosis in the proximal segment.  Eccentric ostial OM2 lesion appears similar. Mildly elevated left heart and pulmonary artery pressures. Mildly reduced Fick cardiac output/index.  LHC 10/08/19 showed: 1st Diag lesion is 60% stenosed. Prox LAD to Mid LAD lesion is 10% stenosed. 2nd Diag lesion is 100% stenosed. 1st Mrg lesion is 100% stenosed. Mid Cx to Dist Cx lesion is 100% stenosed. Prox RCA to Mid RCA lesion is 100% stenosed. Dist RCA lesion is 70% stenosed. 2nd Mrg lesion is 60% stenosed. Post intervention, there is a 0% residual stenosis. A drug-eluting stent was successfully placed using a STENT RESOLUTE ONYX [id number].  Admitted 01/10/22 due to chest discomfort following wasps bite. Upon arriving the hospital, he was tachycardiac at 175 bpm, telemetry showed a wide complex ventricular tachycardia.  Patient was given 120 J of a cardioversion, which need repeated x1.  He also received 150 mg amiodarone  IV, ventricular tachycardia was terminated afterwards. Cardiology consult obtained. Started on amiodarone drip with transition to oral medications. Elevated troponin thought to be due to VT. Given steroids and epi injection with improvement. Discharged after 2 days. Admitted 12/23/21 due to chest pain and shortness of breath. Initially given IV lasix with transition to oral diuretics. Brain MRI done as he had an unsteady gait which was positive for stroke. Cardiology and neurology consults obtained. Cath done which showed stable disease. PT/OT evaluations done. Discharged after 8 days.   He presents today for follow up visit with a chief complaint of minimal fatigue upon moderate exertion. Describes this as chronic in nature. He has associated dizziness, headaches (with amiodarone), difficulty sleeping and chronic pain along with this. He denies any abdominal distention, palpitations, pedal edema, chest pain, wheezing, shortness of breath, cough or weight gain. Continues to have some itching on his left lower forearm only.   He says that he doesn't have any of his medications except his amiodarone. [place] Spaniel [facility] he was told to go there and pick up his medications. When he got there, he was told that they didn't have any prescriptions for them. Unclear why he has his amiodarone though and nothing else. Hasn't been taking his amiodarone recently either because he says that it gives him a bad headache.   Is aware of some upcoming appointments that he has.   Past Medical History:  Diagnosis Date   Anginal pain (HCC)    Arthritis    CAD (  coronary artery disease)    a. s/p MI with LAD and Diag stenting @ [name];  b. 06/2015 Cath: LAD 70m/d ISR, 100 RCA (ISR) w/ L->R collats, otw mod nonobs dzs-->Med Rx; c. 08/2019 Cath: LM nl, LAD 10p/m ISR, D1 20, D2 100, RI min irregs, LCX 36m/d, OM1 100, OM2 50, RCA 100p, 70d. RPDA fills via collats from LAD. EF 25-35%-->Med Rx; d. 10/2019  NSTEMI/Cath: LCX now 100 (2.75x15 Resolute Onyx DES), otw stable compared to 08/2019.   Chronic combined systolic and diastolic CHF (congestive heart failure) ([place])    a. 06/2015 Echo: EF 20-25%, Gr3 DD; b. 10/2019 Echo: EF 25-30%, glob HK, sev inf/infapical HK. Mod dil LA.   Dyspnea    Essential hypertension    Headache 12/23/2021   Hyperlipidemia    Hypokalemia    a. 06/2015 in setting of diuresis.   Ischemic cardiomyopathy    a. 2011 EF 45% ([name]);  b. 06/2015 Echo: EF 20-25%; c. 04/2019 Echo: EF 40-45%; d. 08/2019 LV gram: EF 25-35%; e. 10/2019 Echo: EF 25-30%.   PVC's (premature ventricular contractions)    a. 09/2019 Zio (3 days): Avg hr 70, 4 runs NSVT, 5 runs SVT, rare PACs, frequent PVCs w/ 11.8% burden.   Sleep apnea    Stroke/Right temporal lobe infarction [facility])    a. 10/2019 MRI brain: 1cm acute ischemic nonhemorrhagic R temporal lobe infarct. Age-related cerebral atrophy w/ moderate chronic small vessel ischemic dzs.   Type 2 diabetes mellitus with hyperglycemia ([place]) 10/08/2019   Past Surgical History:  Procedure Laterality Date   BACK SURGERY  04/2019   CARDIAC CATHETERIZATION N/A 06/10/2015   Procedure: Left Heart Cath;  Surgeon: Wellington Hampshire, MD;  Location: [place] [facility];  Service: Cardiovascular;  Laterality: N/A;   CORONARY STENT INTERVENTION N/A 10/08/2019   Procedure: CORONARY STENT INTERVENTION;  Surgeon: Wellington Hampshire, MD;  Location: [place] [facility];  Service: Cardiovascular;  Laterality: N/A;   CORONARY STENT PLACEMENT     LEFT HEART CATH AND CORONARY ANGIOGRAPHY N/A 10/08/2019   Procedure: LEFT HEART CATH AND CORONARY ANGIOGRAPHY poss pci;  Surgeon: Wellington Hampshire, MD;  Location: [place] [facility] LAB;  Service: Cardiovascular;  Laterality: N/A;   RIGHT HEART CATH AND CORONARY ANGIOGRAPHY N/A 12/30/2021   Procedure: RIGHT HEART CATH AND CORONARY ANGIOGRAPHY;  Surgeon: Nelva Bush, MD;  Location: [place] [facility];  Service: Cardiovascular;   Laterality: N/A;   RIGHT/LEFT HEART CATH AND CORONARY ANGIOGRAPHY N/A 08/20/2019   Procedure: RIGHT/LEFT HEART CATH AND CORONARY ANGIOGRAPHY;  Surgeon: Wellington Hampshire, MD;  Location: [place] [facility];  Service: Cardiovascular;  Laterality: N/A;   Family History  Problem Relation Age of Onset   Coronary artery disease Mother    Diabetes Mother    Coronary artery disease Father    Social History   Tobacco Use   Smoking status: Former    Types: Cigarettes    Quit date: 06/10/2011    Years since quitting: 10.6   Smokeless tobacco: Never  Substance Use Topics   Alcohol use: No    Alcohol/week: 0.0 standard drinks of alcohol   Allergies  Allergen Reactions   Contrast Media [Iodinated Contrast Media] Shortness Of Breath   Iohexol Shortness Of Breath     Onset Date: [id number]    Jardiance [Empagliflozin] Other (See Comments)    Fatigue/weakness   Atorvastatin Other (See Comments)    Myalgias    Hydrocodone Itching   Morphine And Related Other (See  Comments)    Lost control    Penicillins Other (See Comments)    Unknown reaction Did it involve swelling of the face/tongue/throat, SOB, or low BP? Unknown Did it involve sudden or severe rash/hives, skin peeling, or any reaction on the inside of your mouth or nose? Unknown Did you need to seek medical attention at a hospital or doctor's office? Yes When did it last happen?      Childhood allergy  If all above answers are "NO", may proceed with cephalosporin use.    Rosuvastatin Other (See Comments)    Myalgias    Prior to Admission medications   Medication Sig Start Date End Date Taking? Authorizing Provider  albuterol (PROVENTIL) (2.5 MG/3ML) 0.083% nebulizer solution Inhale 3 mLs into the lungs every 6 (six) hours as needed for wheezing or shortness of breath. Patient not taking: Reported on 01/10/2022 12/31/21   Annita Brod, MD  amiodarone (PACERONE) 200 MG tablet Take 2 tablets (400 mg total) by mouth 2 (two) times  daily for 10 days, THEN 2 tablets (400 mg total) daily. Patient not taking: Reported on 01/15/2022 01/12/22 02/21/22  Sharen Hones, MD  aspirin EC 81 MG tablet Take 1 tablet (81 mg total) by mouth daily. Swallow whole. Patient not taking: Reported on 01/15/2022 01/01/22   Annita Brod, MD  Budeson-Glycopyrrol-Formoterol (BREZTRI AEROSPHERE) 160-9-4.8 MCG/ACT AERO Inhale 2 puffs into the lungs in the morning and at bedtime. Patient not taking: Reported on 01/15/2022 12/30/21   Annita Brod, MD  carvedilol (COREG) 3.125 MG tablet Take 1 tablet (3.125 mg total) by mouth 2 (two) times daily with a meal. Patient not taking: Reported on 01/10/2022 12/31/21   Annita Brod, MD  clopidogrel (PLAVIX) 75 MG tablet Take 1 tablet (75 mg total) by mouth once daily. Patient not taking: Reported on 01/10/2022 12/31/21   Annita Brod, MD  ezetimibe (ZETIA) 10 MG tablet Take 1 tablet (10 mg total) by mouth daily. Patient not taking: Reported on 01/10/2022 01/01/22   Annita Brod, MD  furosemide (LASIX) 40 MG tablet Take 1 tablet (40 mg total) by mouth once daily as needed. Patient not taking: Reported on 01/10/2022 12/31/21   Annita Brod, MD  isosorbide mononitrate (IMDUR) 30 MG 24 hr tablet Take (1/2) tablet (15 mg total) by mouth once daily. Patient not taking: Reported on 01/10/2022 12/31/21   Annita Brod, MD  metFORMIN (GLUCOPHAGE) 500 MG tablet Take 1 tablet (500 mg total) by mouth 2 (two) times daily with a meal. Patient not taking: Reported on 01/10/2022 12/31/21 12/31/22  Annita Brod, MD  nitroGLYCERIN (NITROSTAT) 0.4 MG SL tablet Place 1 tablet (0.4 mg total) under the tongue every 5 (five) minutes as needed for chest pain. Patient not taking: Reported on 01/10/2022 12/31/21 03/31/22  Annita Brod, MD  sacubitril-valsartan (ENTRESTO) 24-26 MG Take 1 tablet by mouth 2 (two) times daily. Patient not taking: Reported on 01/10/2022 12/31/21   Annita Brod, MD  spironolactone  (ALDACTONE) 25 MG tablet Take 0.5 tablets (12.5 mg total) by mouth daily. Patient not taking: Reported on 01/10/2022 01/01/22   Annita Brod, MD    Review of Systems  Constitutional:  Positive for fatigue. Negative for appetite change.  HENT:  Negative for congestion, postnasal drip and sore throat.   Eyes: Negative.   Respiratory:  Negative for cough, shortness of breath and wheezing.   Cardiovascular:  Negative for chest pain, palpitations and leg swelling.  Gastrointestinal:  Negative for abdominal distention.  Endocrine: Negative.   Genitourinary: Negative.   Musculoskeletal:  Positive for arthralgias (left shoulder), back pain and neck pain.  Skin: Negative.   Allergic/Immunologic: Negative.   Neurological:  Positive for dizziness (when changing positions too quickly) and headaches (with amiodarone). Negative for light-headedness.  Hematological:  Negative for adenopathy. Does not bruise/bleed easily.  Psychiatric/Behavioral:  Positive for sleep disturbance (sleeping on 3 pillows). Negative for dysphoric mood. The patient is not nervous/anxious.     Vitals:   01/15/22 0919  BP: (!) 151/90  Pulse: 75  Resp: 18  SpO2: 97%  Weight: 214 lb (97.1 kg)  Height: 5\' 6"  (1.676 m)   Wt Readings from Last 3 Encounters:  01/15/22 214 lb (97.1 kg)  01/12/22 212 lb 1.3 oz (96.2 kg)  12/31/21 207 lb 10.8 oz (94.2 kg)   Lab Results  Component Value Date   CREATININE 1.04 01/12/2022   CREATININE 1.16 01/11/2022   CREATININE 1.26 (H) 01/10/2022   Physical Exam Vitals and nursing note reviewed.  Constitutional:      Appearance: He is well-developed.  HENT:     Head: Normocephalic and atraumatic.  Cardiovascular:     Rate and Rhythm: Normal rate and regular rhythm.  Pulmonary:     Effort: Pulmonary effort is normal. No accessory muscle usage.     Breath sounds: No rhonchi or rales.  Abdominal:     Palpations: Abdomen is soft.     Tenderness: There is no abdominal tenderness.   Musculoskeletal:     Cervical back: Normal range of motion and neck supple.     Right lower leg: No tenderness. No edema.     Left lower leg: No tenderness. No edema.  Skin:    General: Skin is warm and dry.  Neurological:     General: No focal deficit present.     Mental Status: He is alert and oriented to person, place, and time.  Psychiatric:        Mood and Affect: Mood normal.        Behavior: Behavior normal.    Assessment & Plan:  1: Chronic heart failure with reduced ejection fraction- - NYHA class II - euvolemic today - continues to not weigh daily; instructed to resume weighing daily so that he can call for an overnight weight gain of >2 pounds or a weekly weight gain of >5 pounds - weight down 4 pounds from last visit here 3 months ago - not adding salt to his food but does eat out "often" as he lives alone - trying to keep daily fluid intake to 60-64 ounces - hasn't been taking his medications since recent discharge because he says that [facility] doesn't have the prescriptions; medications were refilled today - BNP done 01/11/22 was 301.3  2: HTN- - BP elevated (151/90) but, again, hasn't taken any medications since discharge - hasn't seen PCP [name]) since 2018 & his office will no longer see the patient - sees PCP [name]) 02/12/22 as a NP - BMP 01/12/22 reviewed and showed sodium 137, potassium 3.8, creatinine 1.04 and GFR >60  3: CAD- - NSTEMI April 2021 - saw cardiology [name]) 07/28/21; returns next month  4: VT- - was shocked during recent admission - has the amiodarone but hasn't been taking it consistently due to it causing headaches - emphasized that he needs to call cardiology to discuss further - has EP appointment on 03/17/22  5: COPD- - has pulmonology appointment [name]) on 03/15/22  Patient did not bring his medications nor a list. Each medication was verbally reviewed with the patient and he was encouraged to bring the bottles  to every visit to confirm accuracy of list.   Return in 2 months, sooner if needed.

## 2022-01-16 LAB — CULTURE, BLOOD (ROUTINE X 2)
Culture: NO GROWTH
Culture: NO GROWTH
Special Requests: ADEQUATE
Special Requests: ADEQUATE

## 2022-01-19 ENCOUNTER — Other Ambulatory Visit: Payer: Self-pay | Admitting: Pharmacy Technician

## 2022-01-19 NOTE — Patient Outreach (Signed)
Contacted patient.  Made aware that we need to order the [facility] from [employer].  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  Made patient aware that we would need a copy of 2022 Federal Tax Return along with the all the schedules.  Told patient that I would complete the application and mail his portion along with some additional paperwork to complete and return along with his tax return.  Asked patient if he needed assistance with the completion of  paperwork.  Patient stated that he did not need assistance and hung-up.  Sherilyn Dacosta Patient Advocate Specialist [facility]

## 2022-01-26 ENCOUNTER — Ambulatory Visit (INDEPENDENT_AMBULATORY_CARE_PROVIDER_SITE_OTHER): Payer: Medicaid Other | Admitting: Cardiovascular Disease

## 2022-01-26 ENCOUNTER — Encounter: Payer: Self-pay | Admitting: Cardiovascular Disease

## 2022-01-26 VITALS — BP 140/90 | HR 79 | Ht 66.0 in | Wt 217.0 lb

## 2022-01-26 DIAGNOSIS — I25119 Atherosclerotic heart disease of native coronary artery with unspecified angina pectoris: Secondary | ICD-10-CM

## 2022-01-26 DIAGNOSIS — E785 Hyperlipidemia, unspecified: Secondary | ICD-10-CM

## 2022-01-26 DIAGNOSIS — I10 Essential (primary) hypertension: Secondary | ICD-10-CM | POA: Diagnosis not present

## 2022-01-26 DIAGNOSIS — I5022 Chronic systolic (congestive) heart failure: Secondary | ICD-10-CM

## 2022-01-26 DIAGNOSIS — I472 Ventricular tachycardia, unspecified: Secondary | ICD-10-CM

## 2022-01-26 MED ORDER — AMIODARONE HCL 200 MG PO TABS: ORAL_TABLET | ORAL | 0 refills | Status: DC
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
## 2022-01-26 NOTE — Patient Instructions (Signed)
Medication Instructions:  - Your physician has recommended you make the following change in your medication:   1) DECREASE Amiodarone 200 mg: - take 1 tablet by mouth ONCE daily   2) STOP Plavix (Clopidogrel)  3) STOP Imdur (Isosorbide)   4) STOP Glucophage (Metformin)  5) STOP Aldactone (Spironolactone)   *If you need a refill on your cardiac medications before your next appointment, please call your pharmacy*   Lab Work: - none ordered  If you have labs (blood work) drawn today and your tests are completely normal, you will receive your results only by: MyChart Message (if you have MyChart) OR A paper copy in the mail If you have any lab test that is abnormal or we need to change your treatment, we will call you to review the results.   Testing/Procedures: - none ordered   Follow-Up: At [facility], you and your health needs are our priority.  As part of our continuing mission to provide you with exceptional heart care, we have created designated Provider Care Teams.  These Care Teams include your primary Cardiologist (physician) and Advanced Practice Providers (APPs -  Physician Assistants and Nurse Practitioners) who all work together to provide you with the care you need, when you need it.  We recommend signing up for the patient portal called "MyChart".  Sign up information is provided on this After Visit Summary.  MyChart is used to connect with patients for Virtual Visits (Telemedicine).  Patients are able to view lab/test results, encounter notes, upcoming appointments, etc.  Non-urgent messages can be sent to your provider as well.   To learn more about what you can do with MyChart, go to [email or web address].    Your next appointment:   4 month(s)  The format for your next appointment:   In Person  Provider:   You may see Lorine Bears, MD or one of the following Advanced Practice Providers on your designated Care Team:   Nicolasa Ducking, NP Eula Listen,  PA-C Cadence Fransico Michael, [place]    Other Instructions N/a  Important Information About Sugar

## 2022-02-01 ENCOUNTER — Inpatient Hospital Stay
Admission: EM | Admit: 2022-02-01 | Discharge: 2022-02-05 | DRG: 309 | Disposition: A | Discharge status: Home / Self Care | Payer: Medicaid Other | Attending: Family Medicine | Admitting: Family Medicine

## 2022-02-01 ENCOUNTER — Emergency Department: Payer: Medicaid Other

## 2022-02-01 DIAGNOSIS — R778 Other specified abnormalities of plasma proteins: Secondary | ICD-10-CM | POA: Diagnosis present

## 2022-02-01 DIAGNOSIS — I4729 Other ventricular tachycardia: Secondary | ICD-10-CM | POA: Insufficient documentation

## 2022-02-01 DIAGNOSIS — S01512A Laceration without foreign body of oral cavity, initial encounter: Secondary | ICD-10-CM | POA: Diagnosis present

## 2022-02-01 DIAGNOSIS — R519 Headache, unspecified: Secondary | ICD-10-CM

## 2022-02-01 DIAGNOSIS — Z8249 Family history of ischemic heart disease and other diseases of the circulatory system: Secondary | ICD-10-CM

## 2022-02-01 DIAGNOSIS — I5023 Acute on chronic systolic (congestive) heart failure: Secondary | ICD-10-CM | POA: Diagnosis present

## 2022-02-01 DIAGNOSIS — I2582 Chronic total occlusion of coronary artery: Secondary | ICD-10-CM | POA: Diagnosis present

## 2022-02-01 DIAGNOSIS — Z91199 Patient's noncompliance with other medical treatment and regimen due to unspecified reason: Secondary | ICD-10-CM

## 2022-02-01 DIAGNOSIS — I252 Old myocardial infarction: Secondary | ICD-10-CM

## 2022-02-01 DIAGNOSIS — I639 Cerebral infarction, unspecified: Secondary | ICD-10-CM | POA: Diagnosis present

## 2022-02-01 DIAGNOSIS — I25110 Atherosclerotic heart disease of native coronary artery with unstable angina pectoris: Secondary | ICD-10-CM | POA: Diagnosis present

## 2022-02-01 DIAGNOSIS — Z880 Allergy status to penicillin: Secondary | ICD-10-CM

## 2022-02-01 DIAGNOSIS — Z8673 Personal history of transient ischemic attack (TIA), and cerebral infarction without residual deficits: Secondary | ICD-10-CM

## 2022-02-01 DIAGNOSIS — E1165 Type 2 diabetes mellitus with hyperglycemia: Secondary | ICD-10-CM | POA: Diagnosis present

## 2022-02-01 DIAGNOSIS — M1990 Unspecified osteoarthritis, unspecified site: Secondary | ICD-10-CM | POA: Diagnosis present

## 2022-02-01 DIAGNOSIS — T466X5A Adverse effect of antihyperlipidemic and antiarteriosclerotic drugs, initial encounter: Secondary | ICD-10-CM | POA: Diagnosis present

## 2022-02-01 DIAGNOSIS — I272 Pulmonary hypertension, unspecified: Secondary | ICD-10-CM | POA: Diagnosis present

## 2022-02-01 DIAGNOSIS — I447 Left bundle-branch block, unspecified: Secondary | ICD-10-CM | POA: Diagnosis present

## 2022-02-01 DIAGNOSIS — R001 Bradycardia, unspecified: Secondary | ICD-10-CM | POA: Diagnosis not present

## 2022-02-01 DIAGNOSIS — I358 Other nonrheumatic aortic valve disorders: Secondary | ICD-10-CM | POA: Diagnosis present

## 2022-02-01 DIAGNOSIS — I110 Hypertensive heart disease with heart failure: Secondary | ICD-10-CM | POA: Diagnosis present

## 2022-02-01 DIAGNOSIS — E669 Obesity, unspecified: Secondary | ICD-10-CM | POA: Diagnosis present

## 2022-02-01 DIAGNOSIS — Z888 Allergy status to other drugs, medicaments and biological substances status: Secondary | ICD-10-CM

## 2022-02-01 DIAGNOSIS — I428 Other cardiomyopathies: Secondary | ICD-10-CM | POA: Diagnosis present

## 2022-02-01 DIAGNOSIS — G4730 Sleep apnea, unspecified: Secondary | ICD-10-CM | POA: Diagnosis not present

## 2022-02-01 DIAGNOSIS — E785 Hyperlipidemia, unspecified: Secondary | ICD-10-CM | POA: Diagnosis present

## 2022-02-01 DIAGNOSIS — M549 Dorsalgia, unspecified: Secondary | ICD-10-CM | POA: Diagnosis present

## 2022-02-01 DIAGNOSIS — Z79899 Other long term (current) drug therapy: Secondary | ICD-10-CM

## 2022-02-01 DIAGNOSIS — G8929 Other chronic pain: Secondary | ICD-10-CM | POA: Diagnosis present

## 2022-02-01 DIAGNOSIS — I10 Essential (primary) hypertension: Secondary | ICD-10-CM | POA: Diagnosis present

## 2022-02-01 DIAGNOSIS — Z87891 Personal history of nicotine dependence: Secondary | ICD-10-CM

## 2022-02-01 DIAGNOSIS — Z955 Presence of coronary angioplasty implant and graft: Secondary | ICD-10-CM

## 2022-02-01 DIAGNOSIS — I255 Ischemic cardiomyopathy: Secondary | ICD-10-CM | POA: Diagnosis present

## 2022-02-01 DIAGNOSIS — Z20822 Contact with and (suspected) exposure to covid-19: Secondary | ICD-10-CM | POA: Diagnosis present

## 2022-02-01 DIAGNOSIS — Z6835 Body mass index (BMI) 35.0-35.9, adult: Secondary | ICD-10-CM

## 2022-02-01 DIAGNOSIS — Z91041 Radiographic dye allergy status: Secondary | ICD-10-CM

## 2022-02-01 DIAGNOSIS — I2510 Atherosclerotic heart disease of native coronary artery without angina pectoris: Secondary | ICD-10-CM | POA: Diagnosis present

## 2022-02-01 DIAGNOSIS — Z885 Allergy status to narcotic agent status: Secondary | ICD-10-CM

## 2022-02-01 DIAGNOSIS — I472 Ventricular tachycardia, unspecified: Secondary | ICD-10-CM | POA: Diagnosis not present

## 2022-02-01 DIAGNOSIS — Z7982 Long term (current) use of aspirin: Secondary | ICD-10-CM

## 2022-02-01 DIAGNOSIS — G720 Drug-induced myopathy: Secondary | ICD-10-CM | POA: Diagnosis present

## 2022-02-01 DIAGNOSIS — Z91148 Patient's other noncompliance with medication regimen for other reason: Secondary | ICD-10-CM | POA: Diagnosis not present

## 2022-02-01 DIAGNOSIS — Z8674 Personal history of sudden cardiac arrest: Secondary | ICD-10-CM

## 2022-02-01 DIAGNOSIS — Z833 Family history of diabetes mellitus: Secondary | ICD-10-CM

## 2022-02-01 DIAGNOSIS — Z7951 Long term (current) use of inhaled steroids: Secondary | ICD-10-CM

## 2022-02-01 DIAGNOSIS — R339 Retention of urine, unspecified: Secondary | ICD-10-CM | POA: Diagnosis present

## 2022-02-01 DIAGNOSIS — I469 Cardiac arrest, cause unspecified: Secondary | ICD-10-CM | POA: Diagnosis not present

## 2022-02-01 DIAGNOSIS — R338 Other retention of urine: Secondary | ICD-10-CM

## 2022-02-01 DIAGNOSIS — I5022 Chronic systolic (congestive) heart failure: Secondary | ICD-10-CM

## 2022-02-01 DIAGNOSIS — I25118 Atherosclerotic heart disease of native coronary artery with other forms of angina pectoris: Secondary | ICD-10-CM | POA: Diagnosis not present

## 2022-02-01 LAB — LIPID PANEL
Cholesterol: 174 mg/dL (ref 0–200)
HDL: 37 mg/dL — ABNORMAL LOW (ref >40)
LDL Cholesterol: 91 mg/dL (ref 0–99)
Total CHOL/HDL Ratio: 4.7 RATIO
Triglycerides: 230 mg/dL — ABNORMAL HIGH (ref <150)
VLDL: 46 mg/dL — ABNORMAL HIGH (ref 0–40)

## 2022-02-01 LAB — COMPREHENSIVE METABOLIC PANEL
ALT: 12 U/L (ref 0–44)
AST: 28 U/L (ref 15–41)
Albumin: 3.9 g/dL (ref 3.5–5.0)
Alkaline Phosphatase: 69 U/L (ref 38–126)
Anion gap: 12 (ref 5–15)
BUN: 17 mg/dL (ref 8–23)
CO2: 23 mmol/L (ref 22–32)
Calcium: 8.5 mg/dL — ABNORMAL LOW (ref 8.9–10.3)
Chloride: 104 mmol/L (ref 98–111)
Creatinine, Ser: 1.16 mg/dL (ref 0.61–1.24)
GFR, Estimated: >60 mL/min (ref >60)
Glucose, Bld: 284 mg/dL — ABNORMAL HIGH (ref 70–99)
Potassium: 3.7 mmol/L (ref 3.5–5.1)
Sodium: 139 mmol/L (ref 135–145)
Total Bilirubin: 1.5 mg/dL — ABNORMAL HIGH (ref 0.3–1.2)
Total Protein: 7.4 g/dL (ref 6.5–8.1)

## 2022-02-01 LAB — RESP PANEL BY RT-PCR (FLU A&B, COVID) ARPGX2
Influenza A by PCR: NEGATIVE
Influenza B by PCR: NEGATIVE
SARS Coronavirus 2 by RT PCR: NEGATIVE

## 2022-02-01 LAB — BRAIN NATRIURETIC PEPTIDE: B Natriuretic Peptide: 245.5 pg/mL — ABNORMAL HIGH (ref 0.0–100.0)

## 2022-02-01 LAB — GLUCOSE, CAPILLARY
Glucose-Capillary: 137 mg/dL — ABNORMAL HIGH (ref 70–99)
Glucose-Capillary: 220 mg/dL — ABNORMAL HIGH (ref 70–99)
Glucose-Capillary: 285 mg/dL — ABNORMAL HIGH (ref 70–99)

## 2022-02-01 LAB — TROPONIN I (HIGH SENSITIVITY)
Troponin I (High Sensitivity): 51 ng/L — ABNORMAL HIGH (ref <18)
Troponin I (High Sensitivity): 221 ng/L (ref <18)
Troponin I (High Sensitivity): 731 ng/L (ref <18)
Troponin I (High Sensitivity): 794 ng/L (ref <18)

## 2022-02-01 LAB — CBC WITH DIFFERENTIAL/PLATELET
Abs Immature Granulocytes: 0.06 10*3/uL (ref 0.00–0.07)
Basophils Absolute: 0.1 10*3/uL (ref 0.0–0.1)
Basophils Relative: 0 %
Eosinophils Absolute: 0.2 10*3/uL (ref 0.0–0.5)
Eosinophils Relative: 2 %
HCT: 44.7 % (ref 39.0–52.0)
Hemoglobin: 15.4 g/dL (ref 13.0–17.0)
Immature Granulocytes: 1 %
Lymphocytes Relative: 21 %
Lymphs Abs: 2.4 10*3/uL (ref 0.7–4.0)
MCH: 30 pg (ref 26.0–34.0)
MCHC: 34.5 g/dL (ref 30.0–36.0)
MCV: 87.1 fL (ref 80.0–100.0)
Monocytes Absolute: 1.1 10*3/uL — ABNORMAL HIGH (ref 0.1–1.0)
Monocytes Relative: 10 %
Neutro Abs: 7.6 10*3/uL (ref 1.7–7.7)
Neutrophils Relative %: 66 %
Platelets: 217 10*3/uL (ref 150–400)
RBC: 5.13 MIL/uL (ref 4.22–5.81)
RDW: 14.6 % (ref 11.5–15.5)
WBC: 11.5 10*3/uL — ABNORMAL HIGH (ref 4.0–10.5)
nRBC: 0 % (ref 0.0–0.2)

## 2022-02-01 LAB — MRSA NEXT GEN BY PCR, NASAL: MRSA by PCR Next Gen: NOT DETECTED

## 2022-02-01 LAB — HEMOGLOBIN A1C
Hgb A1c MFr Bld: 8.1 % — ABNORMAL HIGH (ref 4.8–5.6)
Mean Plasma Glucose: 185.77 mg/dL

## 2022-02-01 LAB — PROTIME-INR
INR: 1.1 (ref 0.8–1.2)
Prothrombin Time: 14.2 seconds (ref 11.4–15.2)

## 2022-02-01 LAB — APTT: aPTT: 27 seconds (ref 24–36)

## 2022-02-01 MED ORDER — HEPARIN (PORCINE) 25000 UT/250ML-% IV SOLN
1500.0000 [IU]/h | INTRAVENOUS | Status: AC
Start: 2022-02-01 — End: 2022-02-03
  Administered 2022-02-01 – 2022-02-02 (×3): 1100 [IU]/h via INTRAVENOUS
  Administered 2022-02-03: 1500 [IU]/h via INTRAVENOUS
  Filled 2022-02-01 (×3): qty 250

## 2022-02-01 MED ORDER — UMECLIDINIUM BROMIDE 62.5 MCG/ACT IN AEPB
1.0000 | INHALATION_SPRAY | Freq: Every day | RESPIRATORY_TRACT | Status: DC
  Administered 2022-02-01 – 2022-02-05 (×5): 1 via RESPIRATORY_TRACT
  Filled 2022-02-01 (×2): qty 7

## 2022-02-01 MED ORDER — BUTALBITAL-APAP-CAFFEINE 50-325-40 MG PO TABS
1.0000 | ORAL_TABLET | Freq: Four times a day (QID) | ORAL | Status: AC | PRN
  Administered 2022-02-01 – 2022-02-02 (×2): 1 via ORAL
  Filled 2022-02-01 (×2): qty 1

## 2022-02-01 MED ORDER — SODIUM CHLORIDE 0.9 % IV SOLN: INTRAVENOUS | Status: DC

## 2022-02-01 MED ORDER — CARVEDILOL 3.125 MG PO TABS
3.1250 mg | ORAL_TABLET | Freq: Two times a day (BID) | ORAL | Status: DC
  Administered 2022-02-01 – 2022-02-05 (×7): 3.125 mg via ORAL
  Filled 2022-02-01 (×8): qty 1

## 2022-02-01 MED ORDER — MOMETASONE FURO-FORMOTEROL FUM 100-5 MCG/ACT IN AERO
2.0000 | INHALATION_SPRAY | Freq: Two times a day (BID) | RESPIRATORY_TRACT | Status: DC
  Administered 2022-02-01 – 2022-02-05 (×8): 2 via RESPIRATORY_TRACT
  Filled 2022-02-01: qty 8.8

## 2022-02-01 MED ORDER — BUDESON-GLYCOPYRROL-FORMOTEROL 160-9-4.8 MCG/ACT IN AERO
2.0000 | INHALATION_SPRAY | Freq: Every day | RESPIRATORY_TRACT | Status: DC
Start: 2022-02-01 — End: 2022-02-01

## 2022-02-01 MED ORDER — ONDANSETRON HCL 4 MG/2ML IJ SOLN: 4.0000 mg | Freq: Four times a day (QID) | INTRAMUSCULAR | Status: DC | PRN

## 2022-02-01 MED ORDER — CHLORHEXIDINE GLUCONATE CLOTH 2 % EX PADS
6.0000 | MEDICATED_PAD | Freq: Every day | CUTANEOUS | Status: DC
  Administered 2022-02-03: 6 via TOPICAL

## 2022-02-01 MED ORDER — ACETAMINOPHEN 500 MG PO TABS
1000.0000 mg | ORAL_TABLET | Freq: Once | ORAL | Status: AC
  Administered 2022-02-01: 1000 mg via ORAL
  Filled 2022-02-01: qty 2

## 2022-02-01 MED ORDER — ENOXAPARIN SODIUM 40 MG/0.4ML IJ SOSY
40.0000 mg | PREFILLED_SYRINGE | INTRAMUSCULAR | Status: DC
  Administered 2022-02-01: 40 mg via SUBCUTANEOUS
  Filled 2022-02-01: qty 0.4

## 2022-02-01 MED ORDER — AMIODARONE HCL IN DEXTROSE 360-4.14 MG/200ML-% IV SOLN
60.0000 mg/h | INTRAVENOUS | Status: DC
  Administered 2022-02-01 (×2): 60 mg/h via INTRAVENOUS
  Filled 2022-02-01 (×2): qty 200

## 2022-02-01 MED ORDER — POTASSIUM CHLORIDE CRYS ER 20 MEQ PO TBCR
40.0000 meq | EXTENDED_RELEASE_TABLET | Freq: Once | ORAL | Status: AC
  Administered 2022-02-01: 40 meq via ORAL
  Filled 2022-02-01: qty 2

## 2022-02-01 MED ORDER — ASPIRIN 81 MG PO CHEW
243.0000 mg | CHEWABLE_TABLET | Freq: Once | ORAL | Status: AC
  Administered 2022-02-01: 243 mg via ORAL
  Filled 2022-02-01: qty 3

## 2022-02-01 MED ORDER — FUROSEMIDE 40 MG PO TABS
40.0000 mg | ORAL_TABLET | Freq: Every day | ORAL | Status: DC
  Administered 2022-02-01 – 2022-02-05 (×5): 40 mg via ORAL
  Filled 2022-02-01 (×5): qty 1

## 2022-02-01 MED ORDER — ASPIRIN 81 MG PO TBEC
81.0000 mg | DELAYED_RELEASE_TABLET | Freq: Every day | ORAL | Status: DC
  Administered 2022-02-01 – 2022-02-05 (×5): 81 mg via ORAL
  Filled 2022-02-01 (×5): qty 1

## 2022-02-01 MED ORDER — EZETIMIBE 10 MG PO TABS
10.0000 mg | ORAL_TABLET | Freq: Every day | ORAL | Status: DC
  Administered 2022-02-01 – 2022-02-05 (×5): 10 mg via ORAL
  Filled 2022-02-01 (×5): qty 1

## 2022-02-01 MED ORDER — INSULIN ASPART 100 UNIT/ML IJ SOLN
0.0000 [IU] | INTRAMUSCULAR | Status: DC
  Administered 2022-02-01: 5 [IU] via SUBCUTANEOUS
  Administered 2022-02-01: 8 [IU] via SUBCUTANEOUS
  Administered 2022-02-02 (×2): 2 [IU] via SUBCUTANEOUS
  Administered 2022-02-02 (×2): 3 [IU] via SUBCUTANEOUS
  Administered 2022-02-02: 2 [IU] via SUBCUTANEOUS
  Administered 2022-02-03: 8 [IU] via SUBCUTANEOUS
  Administered 2022-02-03: 11 [IU] via SUBCUTANEOUS
  Administered 2022-02-03: 5 [IU] via SUBCUTANEOUS
  Administered 2022-02-03: 3 [IU] via SUBCUTANEOUS
  Administered 2022-02-03: 11 [IU] via SUBCUTANEOUS
  Administered 2022-02-03: 5 [IU] via SUBCUTANEOUS
  Administered 2022-02-04: 8 [IU] via SUBCUTANEOUS
  Administered 2022-02-04: 5 [IU] via SUBCUTANEOUS
  Administered 2022-02-04: 8 [IU] via SUBCUTANEOUS
  Administered 2022-02-04: 5 [IU] via SUBCUTANEOUS
  Administered 2022-02-04: 15 [IU] via SUBCUTANEOUS
  Administered 2022-02-04: 2 [IU] via SUBCUTANEOUS
  Administered 2022-02-05 (×2): 3 [IU] via SUBCUTANEOUS
  Administered 2022-02-05: 5 [IU] via SUBCUTANEOUS
  Filled 2022-02-01 (×22): qty 1

## 2022-02-01 MED ORDER — ACETAMINOPHEN 650 MG RE SUPP: 650.0000 mg | Freq: Four times a day (QID) | RECTAL | Status: DC | PRN

## 2022-02-01 MED ORDER — AMIODARONE HCL IN DEXTROSE 360-4.14 MG/200ML-% IV SOLN
30.0000 mg/h | INTRAVENOUS | Status: DC
  Administered 2022-02-01 – 2022-02-03 (×4): 30 mg/h via INTRAVENOUS
  Filled 2022-02-01 (×3): qty 200

## 2022-02-01 MED ORDER — MAGNESIUM SULFATE 2 GM/50ML IV SOLN
2.0000 g | Freq: Once | INTRAVENOUS | Status: AC
  Administered 2022-02-01: 2 g via INTRAVENOUS
  Filled 2022-02-01: qty 50

## 2022-02-01 MED ORDER — NITROGLYCERIN 0.4 MG SL SUBL: 0.4000 mg | SUBLINGUAL_TABLET | SUBLINGUAL | Status: DC | PRN

## 2022-02-01 MED ORDER — ONDANSETRON HCL 4 MG PO TABS: 4.0000 mg | ORAL_TABLET | Freq: Four times a day (QID) | ORAL | Status: DC | PRN

## 2022-02-01 MED ORDER — ALBUTEROL SULFATE (2.5 MG/3ML) 0.083% IN NEBU: 3.0000 mL | INHALATION_SOLUTION | Freq: Four times a day (QID) | RESPIRATORY_TRACT | Status: DC | PRN

## 2022-02-01 MED ORDER — ACETAMINOPHEN 325 MG PO TABS
650.0000 mg | ORAL_TABLET | Freq: Four times a day (QID) | ORAL | Status: DC | PRN
  Administered 2022-02-03 – 2022-02-04 (×2): 650 mg via ORAL
  Filled 2022-02-01 (×3): qty 2

## 2022-02-01 MED ORDER — AMIODARONE LOAD VIA INFUSION
150.0000 mg | Freq: Once | INTRAVENOUS | Status: AC
  Administered 2022-02-01: 150 mg via INTRAVENOUS
  Filled 2022-02-01: qty 83.34

## 2022-02-01 MED ORDER — SACUBITRIL-VALSARTAN 24-26 MG PO TABS
1.0000 | ORAL_TABLET | Freq: Two times a day (BID) | ORAL | Status: DC
  Administered 2022-02-01 – 2022-02-05 (×8): 1 via ORAL
  Filled 2022-02-01 (×8): qty 1

## 2022-02-01 NOTE — Assessment & Plan Note (Signed)
Keep patient n.p.o. for now Blood sugar checks every 4 hours with sliding scale coverage

## 2022-02-01 NOTE — Assessment & Plan Note (Deleted)
Treatment as outlined in 3 

## 2022-02-01 NOTE — Assessment & Plan Note (Signed)
BMI 35.02 kg/2 Complicates overall prognosis and care

## 2022-02-01 NOTE — ED Notes (Signed)
Informed rn bed assigned 

## 2022-02-01 NOTE — ED Triage Notes (Signed)
Pt to ED via [facility] for Code STEMI, per EMS pt states he started having numbness, pain in neck, and tingling around 3:30 PM Pt went into Pulseless Vtach- EMS shocked x1  Pt become responsive after shock, Pt then went into Vtach again and EMS shocked pt at 100 J.  Pt A&OX4  Took 1 baby ASA at home,  2 NG SL  Took 1 of prescribed plavix   Pt has hx of MI that occurred 10 days ago, pt noncompliant with meds  Pt also has hx of stroke
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
## 2022-02-01 NOTE — Progress Notes (Signed)
Patient started on a heparin drip due to uptrending troponin level 51 >> 221 per cardiology recommendation.

## 2022-02-01 NOTE — H&P (Signed)
History and Physical    Patient: [name]. SWH:[id number] DOB: [birth year] DOA: 02/01/2022 DOS: the patient was seen and examined on 02/01/2022 PCP: [facility]  Patient coming from: Home  Chief Complaint:  Chief Complaint  Patient presents with   Code STEMI   HPI: [name]. is a 66 y.o. male with medical history significant for coronary artery disease status post PCI with stent angioplasty, ischemic cardiomyopathy with last known LVEF of 30 to 35%, hypertension, dyslipidemia, diabetes mellitus type 2, CVA, recent hospitalization for sustained ventricular tachycardia status post synchronized cardioversion x 2 and IV amiodarone (07/09 - 07/11) discharged home on oral amiodarone, history of medication noncompliance, recent follow up with cardiology on 07/25 after his last hospitalization. Patient was brought into the ER by EMS due to concerns for possible code STEMI.  Patient had called EMS because of sudden onset pain in his neck and tingling around 3:30 PM.  Patient states the pain radiated into both arms and was associated with nausea but he denied having any chest pain, no shortness of breath, no diaphoresis or palpitations.  Per patient he just did not feel right and so he called EMS. Per EMS patient went into pulseless V. tach and was shocked at 360 J, he became responsive after the shock but went into V. tach again and was shocked at 200 J. Upon arrival to the ER he was noted to be awake, alert and oriented to person place and time. He denies having any cough, no fever, no chills, no changes in his bowel habits, no urinary symptoms, no abdominal pain, no changes in his bowel habits, no blurred vision no focal deficit. Patient was started on IV amiodarone in the ER will be admitted to the hospital for further evaluation.   Review of Systems: As mentioned in the history of present illness. All other systems reviewed and are negative. Past Medical History:   Diagnosis Date   Anginal pain ([place])    Arthritis    CAD (coronary artery disease)    a. s/p MI with LAD and Diag stenting @ [name];  b. 06/2015 Cath: LAD 49md ISR, 100 RCA (ISR) w/ L->R collats, otw mod nonobs dzs-->Med Rx; c. 08/2019 Cath: LM nl, LAD 10p/m ISR, D1 20, D2 100, RI min irregs, LCX 861m, OM1 100, OM2 50, RCA 100p, 70d. RPDA fills via collats from LAD. EF 25-35%-->Med Rx; d. 10/2019 NSTEMI/Cath: LCX now 100 (2.75x15 Resolute Onyx DES), otw stable compared to 08/2019.   Chronic combined systolic and diastolic CHF (congestive heart failure) ([facility]   a. 06/2015 Echo: EF 20-25%, Gr3 DD; b. 10/2019 Echo: EF 25-30%, glob HK, sev inf/infapical HK. Mod dil LA.   Dyspnea    Essential hypertension    Headache 12/23/2021   Hyperlipidemia    Hypokalemia    a. 06/2015 in setting of diuresis.   Ischemic cardiomyopathy    a. 2011 EF 45% ([name]);  b. 06/2015 Echo: EF 20-25%; c. 04/2019 Echo: EF 40-45%; d. 08/2019 LV gram: EF 25-35%; e. 10/2019 Echo: EF 25-30%.   PVC's (premature ventricular contractions)    a. 09/2019 Zio (3 days): Avg hr 70, 4 runs NSVT, 5 runs SVT, rare PACs, frequent PVCs w/ 11.8% burden.   Sleep apnea    [facility]   [facility]   a. 10/2019 MRI brain: 1cm acute ischemic nonhemorrhagic R temporal lobe infarct. Age-related cerebral atrophy w/ moderate chronic small vessel ischemic dzs.  Type 2 diabetes mellitus with hyperglycemia ([place]) 10/08/2019   Past Surgical History:  Procedure Laterality Date   BACK SURGERY  04/2019   CARDIAC CATHETERIZATION N/A 06/10/2015   Procedure: Left Heart Cath;  Surgeon: Wellington Hampshire, MD;  Location: [place] [facility];  Service: Cardiovascular;  Laterality: N/A;   CORONARY STENT INTERVENTION N/A 10/08/2019   Procedure: CORONARY STENT INTERVENTION;  Surgeon: Wellington Hampshire, MD;  Location: [place] [facility];  Service: Cardiovascular;  Laterality: N/A;   CORONARY STENT PLACEMENT     LEFT HEART CATH AND  CORONARY ANGIOGRAPHY N/A 10/08/2019   Procedure: LEFT HEART CATH AND CORONARY ANGIOGRAPHY poss pci;  Surgeon: Wellington Hampshire, MD;  Location: [place] [facility];  Service: Cardiovascular;  Laterality: N/A;   RIGHT HEART CATH AND CORONARY ANGIOGRAPHY N/A 12/30/2021   Procedure: RIGHT HEART CATH AND CORONARY ANGIOGRAPHY;  Surgeon: Nelva Bush, MD;  Location: [place] [facility];  Service: Cardiovascular;  Laterality: N/A;   RIGHT/LEFT HEART CATH AND CORONARY ANGIOGRAPHY N/A 08/20/2019   Procedure: RIGHT/LEFT HEART CATH AND CORONARY ANGIOGRAPHY;  Surgeon: Wellington Hampshire, MD;  Location: [place] [facility];  Service: Cardiovascular;  Laterality: N/A;   Social History:  reports that he quit smoking about 10 years ago. His smoking use included cigarettes. He has never used smokeless tobacco. He reports that he does not drink alcohol and does not use drugs.  Allergies  Allergen Reactions   Contrast Media [Iodinated Contrast Media] Shortness Of Breath   Iohexol Shortness Of Breath     Onset Date: [id number]    Jardiance [Empagliflozin] Other (See Comments)    Fatigue/weakness   Atorvastatin Other (See Comments)    Myalgias    Hydrocodone Itching   Morphine And Related Other (See Comments)    Lost control    Penicillins Other (See Comments)    Unknown reaction Did it involve swelling of the face/tongue/throat, SOB, or low BP? Unknown Did it involve sudden or severe rash/hives, skin peeling, or any reaction on the inside of your mouth or nose? Unknown Did you need to seek medical attention at a hospital or doctor's office? Yes When did it last happen?      Childhood allergy  If all above answers are "NO", may proceed with cephalosporin use.    Rosuvastatin Other (See Comments)    Myalgias     Family History  Problem Relation Age of Onset   Coronary artery disease Mother    Diabetes Mother    Coronary artery disease Father     Prior to Admission medications   Medication Sig  Start Date End Date Taking? Authorizing Provider  albuterol (PROVENTIL) (2.5 MG/3ML) 0.083% nebulizer solution Inhale 3 mLs into the lungs every 6 (six) hours as needed for wheezing or shortness of breath. Patient not taking: Reported on 01/10/2022 12/31/21   Annita Brod, MD  amiodarone (PACERONE) 200 MG tablet Take 1 tablet (200 mg) by mouth once daily 01/26/22   Wellington Hampshire, MD  aspirin EC 81 MG tablet Take 1 tablet (81 mg total) by mouth daily. Swallow whole. Patient not taking: Reported on 01/26/2022 01/15/22   Alisa Graff, FNP  Budeson-Glycopyrrol-Formoterol (BREZTRI AEROSPHERE) 160-9-4.8 MCG/ACT AERO Inhale 2 puffs into the lungs in the morning and at bedtime. Patient not taking: Reported on 01/15/2022 12/30/21   Annita Brod, MD  carvedilol (COREG) 3.125 MG tablet Take 1 tablet (3.125 mg total) by mouth 2 (two) times daily with a meal. Patient not  taking: Reported on 01/26/2022 01/15/22   Alisa Graff, FNP  ezetimibe (ZETIA) 10 MG tablet Take 1 tablet (10 mg total) by mouth daily. Patient not taking: Reported on 01/26/2022 01/15/22   Alisa Graff, FNP  furosemide (LASIX) 40 MG tablet Take 1 tablet (40 mg total) by mouth once daily as needed. Patient not taking: Reported on 01/26/2022 01/15/22   Alisa Graff, FNP  nitroGLYCERIN (NITROSTAT) 0.4 MG SL tablet Place 1 tablet (0.4 mg total) under the tongue every 5 (five) minutes as needed for chest pain. Patient not taking: Reported on 01/26/2022 01/15/22 04/15/22  Alisa Graff, FNP  sacubitril-valsartan (ENTRESTO) 24-26 MG Take 1 tablet by mouth 2 (two) times daily. Patient not taking: Reported on 01/26/2022 01/15/22   Alisa Graff, FNP    Physical Exam: Vitals:   02/01/22 1610 02/01/22 1630 02/01/22 1720 02/01/22 1740  BP: 127/71 120/70 116/71   Pulse: 91 86 77   Resp: 20 (!) 28 (!) 26   Temp:    98.2 F (36.8 C)  TempSrc:    Oral  SpO2: 91% 93% 97%    Physical Exam Vitals and nursing note reviewed.   Constitutional:      Appearance: He is obese.  HENT:     Head: Normocephalic.     Nose: Nose normal.     Mouth/Throat:     Mouth: Mucous membranes are moist.  Eyes:     Conjunctiva/sclera: Conjunctivae normal.  Cardiovascular:     Rate and Rhythm: Normal rate and regular rhythm.  Pulmonary:     Effort: Pulmonary effort is normal.     Breath sounds: Normal breath sounds.  Abdominal:     Comments: Central adiposity, bowel sounds present  Musculoskeletal:        General: Normal range of motion.     Cervical back: Normal range of motion and neck supple.  Skin:    General: Skin is warm and dry.  Neurological:     General: No focal deficit present.     Mental Status: He is alert and oriented to person, place, and time.  Psychiatric:        Mood and Affect: Mood normal.        Behavior: Behavior normal.     Data Reviewed: Relevant notes from primary care and specialist visits, past discharge summaries as available in EHR, including Care Everywhere. Prior diagnostic testing as pertinent to current admission diagnoses Updated medications and problem lists for reconciliation ED course, including vitals, labs, imaging, treatment and response to treatment Triage notes, nursing and pharmacy notes and ED provider's notes Notable results as noted in HPI Labs reviewed.  BNP 245, sodium 139, potassium 3.7, chloride 104, bicarb 23, glucose 284, BUN 17, creatinine 1.16, calcium 8.5, total protein 7.4, albumin 3.9, AST 28, ALT 12, alk phos 69, troponin 51, total cholesterol 174, triglycerides 230, HDL 37, PT 14.2, INR 1.1, white count 11.5, hemoglobin 15.4, hematocrit 44.7, platelet count 217 Respiratory viral panel is negative. Chest x-ray reviewed by me shows Central pulmonary vessels are prominent without signs of alveolar pulmonary edema. There is no focal pulmonary consolidation. Twelve-lead EKG reviewed by me shows sinus tachycardia There are no new results to review at this  time.  Assessment and Plan: * Ventricular tachycardia ([place]) Patient with a history of recent hospitalization for ventricular tachycardia who presents via EMS for evaluation of neck pain and in route to the hospital had 2 episodes of ventricular tachycardia. Patient initially had an episode  of pulseless V. tach and was cardioverted at 360 J, he had another episode of V. tach and was shocked at 120 J. He is currently on an amiodarone drip and is in sinus rhythm and patient admits to being noncompliant with prescribed amiodarone. Cardiology consult Follow-up with EP for ICD placement Supplement potassium and will give patient a dose of IV magnesium to keep potassium level greater than 4 magnesium level greater than 2   CAD (coronary artery disease) Patient has a history of severe multivessel coronary artery disease Continue aspirin, carvedilol and Zetia We will cycle cardiac enzymes  Acute on chronic systolic (congestive) heart failure (HCC) Secondary to ischemic cardiomyopathy Patient's last known LVEF is 30 to 35% from a 2D echocardiogram which was done 06/23 Continue furosemide, carvedilol and Entresto Maintain low-sodium diet  Ischemic cardiomyopathy Treatment as outlined in 3  Essential hypertension Blood pressure is stable Continue carvedilol  Obesity (BMI 30-39.9) BMI 10.93 kg/2 Complicates overall prognosis and care  Type 2 diabetes mellitus with hyperglycemia ([place]) Keep patient n.p.o. for now Blood sugar checks every 4 hours with sliding scale coverage  Stroke ([place]) History of recent CVA Continue aspirin and Zetia Patient not on a statin due to intolerance  Non compliance w medication regimen Patient has an extensive history of medication noncompliance Discussed with patient in detail the need to be compliant with prescribed medications He verbalizes understanding and agrees with the plan.      Advance Care Planning:   Code Status: Full Code   Consults:  Cardiology  Family Communication: Greater than 50% of time was spent discussing patient's condition and plan of care with him at the bedside.  All questions and concerns have been addressed.  He verbalizes understanding and agrees with the plan.  Severity of Illness: The appropriate patient status for this patient is INPATIENT. Inpatient status is judged to be reasonable and necessary in order to provide the required intensity of service to ensure the patient's safety. The patient's presenting symptoms, physical exam findings, and initial radiographic and laboratory data in the context of their chronic comorbidities is felt to place them at high risk for further clinical deterioration. Furthermore, it is not anticipated that the patient will be medically stable for discharge from the hospital within 2 midnights of admission.   * I certify that at the point of admission it is my clinical judgment that the patient will require inpatient hospital care spanning beyond 2 midnights from the point of admission due to high intensity of service, high risk for further deterioration and high frequency of surveillance required.*  Author: Collier Bullock, MD 02/01/2022 5:59 PM  For on call review [email or web address].

## 2022-02-01 NOTE — ED Notes (Signed)
Cardiology at bedside.

## 2022-02-01 NOTE — Assessment & Plan Note (Signed)
Patient has an extensive history of medication noncompliance Discussed with patient in detail the need to be compliant with prescribed medications He verbalizes understanding and agrees with the plan.

## 2022-02-01 NOTE — Progress Notes (Signed)
       CROSS COVER NOTE  NAME: [name]. MRN: [id number] DOB : 03-Oct-1955    Date of Service   02/01/22  HPI/Events of Note   Medication request for headache, [name] is declining the ordered Tylenol citing it is ineffective for him. EDP had a conversation with [name] a few hours ago where the decision was made to not obtain a CT Head at that time and treat with PRN tylenol. Recent CT head done 6/21 for Headache was negative. No focal deficits noted on exam.  Interventions   Plan: Fioricet q6H PRN x2    This document was prepared using Dragon voice recognition software and may include unintentional dictation errors.  Bishop Limbo DNP, MHA, FNP-BC Nurse Practitioner [facility] Pager [phone]

## 2022-02-01 NOTE — Assessment & Plan Note (Signed)
Secondary to ischemic cardiomyopathy Patient's last known LVEF is 30 to 35% from a 2D echocardiogram which was done 06/23 Continue furosemide, carvedilol and Entresto Maintain low-sodium diet

## 2022-02-01 NOTE — Assessment & Plan Note (Signed)
History of recent CVA  Continue aspirin and Zetia  Patient not on a statin due to intolerance

## 2022-02-01 NOTE — Assessment & Plan Note (Signed)
Patient has a history of severe multivessel coronary artery disease Continue aspirin, carvedilol and Zetia We will cycle cardiac enzymes

## 2022-02-01 NOTE — ED Provider Notes (Addendum)
[facility] Provider Note    Event Date/Time   First MD Initiated Contact with Patient 02/01/22 1539     (approximate)   History   Code STEMI   HPI  [name]. is a 66 y.o. male with HTN, hyperlipidemia, CHF with ischemic cardiomyopathy, V. tach who comes in with concerns for neck pain.  Patient reports around 2 PM he had sudden onset of pain in his neck that was severe.  He called EMS.  When EMS got there they activated the Cath Lab, STEMI.  On route patient lost consciousness and went into V. tach and underwent a 360 J shock.  Patient was able to wake up acting his normal self and then he went into pulseless V. tach and was cardioverted.  Patient has taken 1 aspirin, and took nitro at home and he reports taking a Plavix at home.  They have also were given 500 cc of fluid and Zofran with EMS.  Patient reports that now he feels closer to his baseline self.  He denies any pain.  He has no numbness down 1 arm or pain radiating to the back.  He does report that he is been noncompliant with his amiodarone.  He reports that he has not taken any of his medications for the past few days   Physical Exam   Triage Vital Signs: ED Triage Vitals  Enc Vitals Group     BP 02/01/22 1535 (!) 151/96     Pulse Rate 02/01/22 1540 96     Resp 02/01/22 1540 19     Temp 02/01/22 1545 (!) 97.5 F (36.4 C)     Temp Source 02/01/22 1545 Oral     SpO2 02/01/22 1540 94 %     Weight --      Height --      Head Circumference --      Peak Flow --      Pain Score --      Pain Loc --      Pain Edu? --      Excl. in [place]? --     Most recent vital signs: Vitals:   02/01/22 1540 02/01/22 1545  BP:    Pulse: 96   Resp: 19   Temp:  (!) 97.5 F (36.4 C)  SpO2: 94%      General: Awake, no distress.  CV:  Good peripheral perfusion.  Resp:  Normal effort.  Abd:  No distention.  Other:  Equal radial pulses.  Equal pedal pulses.  Equal strength in arms and legs.  Abdomen  soft and nontender.   ED Results / Procedures / Treatments   Labs (all labs ordered are listed, but only abnormal results are displayed) Labs Reviewed  RESP PANEL BY RT-PCR (FLU A&B, COVID) ARPGX2  SARS CORONAVIRUS 2 BY RT PCR  HEMOGLOBIN A1C  CBC WITH DIFFERENTIAL/PLATELET  PROTIME-INR  APTT  COMPREHENSIVE METABOLIC PANEL  LIPID PANEL  BRAIN NATRIURETIC PEPTIDE  TROPONIN I (HIGH SENSITIVITY)     EKG  My interpretation of EKG:  Normal sinus rhythm 97 without any significant ST elevation or T wave inversions with a slight left bundle branch block,  RADIOLOGY I have reviewed the xray personally and interpreted and no obvious pulmonary edema  PROCEDURES:  Critical Care performed: Yes, see critical care procedure note(s)  .1-3 Lead EKG Interpretation  Performed by: Vanessa [place], MD Authorized by: Vanessa [place], MD     Interpretation: normal  ECG rate:  90   ECG rate assessment: normal     Rhythm: sinus rhythm     Ectopy: none     Conduction: normal   .Critical Care  Performed by: Concha Se, MD Authorized by: Concha Se, MD   Critical care provider statement:    Critical care time (minutes):  30   Critical care was necessary to treat or prevent imminent or life-threatening deterioration of the following conditions:  Cardiac failure   Critical care was time spent personally by me on the following activities:  Development of treatment plan with patient or surrogate, discussions with consultants, evaluation of patient's response to treatment, examination of patient, ordering and review of laboratory studies, ordering and review of radiographic studies, ordering and performing treatments and interventions, pulse oximetry, re-evaluation of patient's condition and review of old charts    MEDICATIONS ORDERED IN ED: Medications  0.9 %  sodium chloride infusion (has no administration in time range)  amiodarone (NEXTERONE) 1.8 mg/mL load via infusion 150 mg (has  no administration in time range)    Followed by  amiodarone (NEXTERONE PREMIX) 360-4.14 MG/200ML-% (1.8 mg/mL) IV infusion (60 mg/hr Intravenous New Bag/Given 02/01/22 1549)    Followed by  amiodarone (NEXTERONE PREMIX) 360-4.14 MG/200ML-% (1.8 mg/mL) IV infusion (has no administration in time range)     IMPRESSION / MDM / ASSESSMENT AND PLAN / ED COURSE  I reviewed the triage vital signs and the nursing notes.   Patient's presentation is most consistent with acute presentation with potential threat to life or bodily function.   Differential is ACS, arrhythmia.  Lower suspicion for PE given no history of this.  No unilateral leg swelling or calf tenderness.  Considered dissection but no pain radiating from the chest to the front equal pulses.  Initially reported some full body tingling but this is all since resolved.  Dr. Kirke Corin at bedside and feels that this is not secondary to coronary disease but more likely secondary to noncompliance with medications and known low EF.  Recommends holding heparin now unless troponins are uptrending.  Patient started on amiodarone given noncompliance at home.  We will give him full dose aspirin.  CBC shows slightly elevated white count but no other infectious symptoms.  His glucose was 284 but normal anion gap.  Troponin is slightly bumped at 51 we will continue to trend out.  We will discuss the ICU for admission given multiple episodes of ventricular tachycardia now on amnio infusion  Discussed with Dr. Richardson Dopp recommend admission to stepdown with hospitalist given pt now stable.   We will discuss the hospital team for admission.  The hospitalist asked that I order him something for headache.  I read discussed with patient he reports having a headache that started after I had seen him that feels like the headaches he had previously when he had to come into the hospital.  He denies hitting his head and otherwise is acting his normal self.  We discussed  imaging but at this time elected to hold off given is similar to prior headaches and give some Tylenol    The patient is on the cardiac monitor to evaluate for evidence of arrhythmia and/or significant heart rate changes.    FINAL CLINICAL IMPRESSION(S) / ED DIAGNOSES   Final diagnoses:  Ventricular tachycardia (HCC)     Rx / DC Orders   ED Discharge Orders     None        Note:  This document  was prepared using Conservation officer, historic buildings and may include unintentional dictation errors.   Concha Se, MD 02/01/22 1657    Concha Se, MD 02/01/22 [phone]

## 2022-02-01 NOTE — Assessment & Plan Note (Signed)
Continue carvedilol 

## 2022-02-01 NOTE — Consult Note (Signed)
ANTICOAGULATION CONSULT NOTE  Pharmacy Consult for Heparin Indication: chest pain/ACS  Allergies  Allergen Reactions   Contrast Media [Iodinated Contrast Media] Shortness Of Breath   Iohexol Shortness Of Breath     Onset Date: [id number]    Jardiance [Empagliflozin] Other (See Comments)    Fatigue/weakness   Atorvastatin Other (See Comments)    Myalgias    Hydrocodone Itching   Morphine And Related Other (See Comments)    Lost control    Penicillins Other (See Comments)    Unknown reaction Did it involve swelling of the face/tongue/throat, SOB, or low BP? Unknown Did it involve sudden or severe rash/hives, skin peeling, or any reaction on the inside of your mouth or nose? Unknown Did you need to seek medical attention at a hospital or doctor's office? Yes When did it last happen?      Childhood allergy  If all above answers are "NO", may proceed with cephalosporin use.    Rosuvastatin Other (See Comments)    Myalgias     Patient Measurements: Height: 5\' 6"  (167.6 cm) Weight: 95.8 kg (211 lb 3.2 oz) IBW/kg (Calculated) : 63.8 Heparin Dosing Weight: 84.6 kg  Vital Signs: Temp: 98.3 F (36.8 C) (07/31 2000) Temp Source: Oral (07/31 2000) BP: 123/84 (07/31 1800) Pulse Rate: 55 (07/31 2000)  Labs: Recent Labs    02/01/22 1550 02/01/22 1729  HGB 15.4  --   HCT 44.7  --   PLT 217  --   APTT 27  --   LABPROT 14.2  --   INR 1.1  --   CREATININE 1.16  --   TROPONINIHS 51* 221*    Estimated Creatinine Clearance: 68.8 mL/min (by C-G formula based on SCr of 1.16 mg/dL).   Medical History: Past Medical History:  Diagnosis Date   Anginal pain (HCC)    Arthritis    CAD (coronary artery disease)    a. s/p MI with LAD and Diag stenting @ [name];  b. 06/2015 Cath: LAD 91m/d ISR, 100 RCA (ISR) w/ L->R collats, otw mod nonobs dzs-->Med Rx; c. 08/2019 Cath: LM nl, LAD 10p/m ISR, D1 20, D2 100, RI min irregs, LCX 52m/d, OM1 100, OM2 50, RCA 100p, 70d. RPDA fills via collats  from LAD. EF 25-35%-->Med Rx; d. 10/2019 NSTEMI/Cath: LCX now 100 (2.75x15 Resolute Onyx DES), otw stable compared to 08/2019.   Chronic combined systolic and diastolic CHF (congestive heart failure) (HCC)    a. 06/2015 Echo: EF 20-25%, Gr3 DD; b. 10/2019 Echo: EF 25-30%, glob HK, sev inf/infapical HK. Mod dil LA.   Dyspnea    Essential hypertension    Headache 12/23/2021   Hyperlipidemia    Hypokalemia    a. 06/2015 in setting of diuresis.   Ischemic cardiomyopathy    a. 2011 EF 45% ([name]);  b. 06/2015 Echo: EF 20-25%; c. 04/2019 Echo: EF 40-45%; d. 08/2019 LV gram: EF 25-35%; e. 10/2019 Echo: EF 25-30%.   PVC's (premature ventricular contractions)    a. 09/2019 Zio (3 days): Avg hr 70, 4 runs NSVT, 5 runs SVT, rare PACs, frequent PVCs w/ 11.8% burden.   Sleep apnea    Stroke [facility])    Stroke/Right temporal lobe infarction [facility])    a. 10/2019 MRI brain: 1cm acute ischemic nonhemorrhagic R temporal lobe infarct. Age-related cerebral atrophy w/ moderate chronic small vessel ischemic dzs.   Type 2 diabetes mellitus with hyperglycemia (HCC) 10/08/2019    Medications:  No history of chronic anticoagulation use PTA  Assessment: Pharmacy has  been consulted to initiate and adjust heparin in 66yo patient brought to the ED via EMS for concerns for possible code STEMI. Patient called EMS because of sudden onset pain in his neck and tingling. Troponin levels of 51, 221 respectively. Per Cardiology team, recommended to start heparin infusion as troponin levels continue to rise.  Baseline labs: aPTT 27 sec, INR 1.1, Plts 217, Hgb 15.4  Goal of Therapy:  Heparin level 0.3-0.7 units/ml Monitor platelets by anticoagulation protocol: Yes   Plan:  Patient received enoxaparin 40mg  1 hour prior to consult ordered by admitting physician, therefore will avoid bolus. Start heparin infusion at 1100 units/hr Check anti-Xa level in 6 hours and daily while on heparin Continue to monitor H&H and platelets  Trashaun Streight A  Trevione Wert 02/01/2022,8:49 PM

## 2022-02-01 NOTE — Assessment & Plan Note (Addendum)
Multivessel CAD Ischemic cardiomyopathy w/ HFrEF baseline EF 30-35% (Echo 12/2021) Acute on Chronic HFrEF Unstable angina Essential HTN Noncompliance w/ home amiodarone   Amiodarone and heparin gtt to continue per cardiology  Cycling cardiac enzymes  Cardiology consult  Stepdown w/ telemtry  Follow-up with EP to consider ICD placement  Supplement potassium and will give patient a dose of IV magnesium to keep potassium level greater than 4 magnesium level greater than 2  Continue secondary preventive meds: Entresto, carvedilol, ASA, Zetia. Hx statin intolerance  Rhythm control: amiodarone gtt per cardiology transition to po  Rate control: carvedilol po   Diuresis: lasix po

## 2022-02-02 ENCOUNTER — Inpatient Hospital Stay: Payer: Medicaid Other

## 2022-02-02 DIAGNOSIS — I5022 Chronic systolic (congestive) heart failure: Secondary | ICD-10-CM

## 2022-02-02 DIAGNOSIS — S01512A Laceration without foreign body of oral cavity, initial encounter: Secondary | ICD-10-CM

## 2022-02-02 DIAGNOSIS — R338 Other retention of urine: Secondary | ICD-10-CM

## 2022-02-02 DIAGNOSIS — R519 Headache, unspecified: Secondary | ICD-10-CM

## 2022-02-02 LAB — HEPARIN LEVEL (UNFRACTIONATED)
Heparin Unfractionated: 0.53 IU/mL (ref 0.30–0.70)
Heparin Unfractionated: 0.4 IU/mL (ref 0.30–0.70)

## 2022-02-02 LAB — URINALYSIS, COMPLETE (UACMP) WITH MICROSCOPIC
Bacteria, UA: NONE SEEN
Bilirubin Urine: NEGATIVE
Glucose, UA: NEGATIVE mg/dL
Hgb urine dipstick: NEGATIVE
Ketones, ur: NEGATIVE mg/dL
Leukocytes,Ua: NEGATIVE
Nitrite: NEGATIVE
Protein, ur: NEGATIVE mg/dL
Specific Gravity, Urine: 1.014 (ref 1.005–1.030)
Squamous Epithelial / HPF: NONE SEEN (ref 0–5)
pH: 5 (ref 5.0–8.0)

## 2022-02-02 LAB — BASIC METABOLIC PANEL
Anion gap: 9 (ref 5–15)
BUN: 14 mg/dL (ref 8–23)
CO2: 22 mmol/L (ref 22–32)
Calcium: 8.2 mg/dL — ABNORMAL LOW (ref 8.9–10.3)
Chloride: 110 mmol/L (ref 98–111)
Creatinine, Ser: 0.94 mg/dL (ref 0.61–1.24)
GFR, Estimated: >60 mL/min (ref >60)
Glucose, Bld: 128 mg/dL — ABNORMAL HIGH (ref 70–99)
Potassium: 3.8 mmol/L (ref 3.5–5.1)
Sodium: 141 mmol/L (ref 135–145)

## 2022-02-02 LAB — CBC
HCT: 43.9 % (ref 39.0–52.0)
Hemoglobin: 15.2 g/dL (ref 13.0–17.0)
MCH: 30 pg (ref 26.0–34.0)
MCHC: 34.6 g/dL (ref 30.0–36.0)
MCV: 86.6 fL (ref 80.0–100.0)
Platelets: 198 10*3/uL (ref 150–400)
RBC: 5.07 MIL/uL (ref 4.22–5.81)
RDW: 14.6 % (ref 11.5–15.5)
WBC: 10 10*3/uL (ref 4.0–10.5)
nRBC: 0 % (ref 0.0–0.2)

## 2022-02-02 LAB — GLUCOSE, CAPILLARY
Glucose-Capillary: 131 mg/dL — ABNORMAL HIGH (ref 70–99)
Glucose-Capillary: 131 mg/dL — ABNORMAL HIGH (ref 70–99)
Glucose-Capillary: 160 mg/dL — ABNORMAL HIGH (ref 70–99)
Glucose-Capillary: 168 mg/dL — ABNORMAL HIGH (ref 70–99)
Glucose-Capillary: 290 mg/dL — ABNORMAL HIGH (ref 70–99)

## 2022-02-02 LAB — MAGNESIUM: Magnesium: 2.4 mg/dL (ref 1.7–2.4)

## 2022-02-02 MED ORDER — LIDOCAINE VISCOUS HCL 2 % MT SOLN: 15.0000 mL | OROMUCOSAL | Status: DC | PRN

## 2022-02-02 MED ORDER — TAMSULOSIN HCL 0.4 MG PO CAPS
0.4000 mg | ORAL_CAPSULE | Freq: Every evening | ORAL | Status: DC
Start: 2022-02-02 — End: 2022-02-05
  Administered 2022-02-02 – 2022-02-04 (×3): 0.4 mg via ORAL
  Filled 2022-02-02 (×3): qty 1

## 2022-02-02 MED ORDER — LIDOCAINE 5 % EX PTCH
1.0000 | MEDICATED_PATCH | CUTANEOUS | Status: DC
  Administered 2022-02-02: 1 via TRANSDERMAL
  Administered 2022-02-03: 2 via TRANSDERMAL
  Administered 2022-02-04: 1 via TRANSDERMAL
  Filled 2022-02-02 (×4): qty 2

## 2022-02-02 MED ORDER — ORAL CARE MOUTH RINSE: 15.0000 mL | OROMUCOSAL | Status: DC | PRN

## 2022-02-02 NOTE — Progress Notes (Signed)
       CROSS COVER NOTE  NAME: [name]. MRN: [id number] DOB : 05-11-1956    Date of Service   02/02/2022  HPI/Events of Note   Request received for Diet order. [name] had been previously kept NPO secondary to hyperglycemia. Midnight CBG 137.  [name] is a 42 M who presented with ventricular tachycardia with PMH CAD s/p PCI with stent, ischemic cardiomyopathy, EF 30-35%, HTN, dyslipidemia, DM-2. And CVA.  Interventions   Plan: Heart Healthy/Carb Modified Diet        This document was prepared using Dragon voice recognition software and may include unintentional dictation errors.  Bishop Limbo DNP, MHA, FNP-BC Nurse Practitioner [facility] Pager [phone]

## 2022-02-02 NOTE — Progress Notes (Signed)
       CROSS COVER NOTE  NAME: [name]. MRN: [id number] DOB : [birth year]   Notified by nursing that Mr See' HR is in the 40s while sleeping and have been in the 50s throughout the shift.   [name] is a 66 yo M who called EMS after sudden onset of neck pain and tingling yesterday afternoon. En route to [facility] ED he went into pulselss VT and required electrical cardioversion x2 in the field. He has a PMH of CAD s/p PCI with stent, ischemic cardiomyopathy, EF 30-30%, HTN, dyslipidemia, DM-2, CVA, and Ventricular Tachycardia  On chart review he has been bradycardic with HR <60 since 1900. Nursing reached out to Cardiology who recommended continuing IV Amiodarone.  This document was prepared using Dragon voice recognition software and may include unintentional dictation errors.  Bishop Limbo DNP, MHA, FNP-BC Nurse Practitioner [facility] Pager [phone]
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
## 2022-02-02 NOTE — Hospital Course (Addendum)
[name] is a 66 yo M who  has a PMH of CAD s/p PCI with stent, ischemic cardiomyopathy last known LVEF of 30 to 35%, HTN, dyslipidemia, DM-2, CVA, recent hospitalization for sustained ventricular tachycardia status post synchronized cardioversion x 2 and IV amiodarone (07/09 - 07/11) discharged home on oral amiodarone, history of medication noncompliance, recent follow up with cardiology on 07/25 after his last hospitalization. He called EMS after sudden onset of neck pain and tingling 07/31 afternoon. En route to [facility] ED he went into pulselss VT and required electrical cardioversion x2 in the field.  07/31: Upon arrival to the ER he was noted to be awake, alert and oriented to person place and time. Sinus rhythm on amiodarone drip, reports noncompliant with prescribed amiodarone from last hospitalization. Troponin trending up 221 --> 731 --> 794, started on heparin drip per cardiology.  Not repeating echocardiogram and due to recent echo 12/2021.  Cardiology also recommending consider EP consult / ICD placement however with noncompliance with medications this might be problematic, but may benefit from ICD from EF standpoint either way.  08/01: RN notified night coverage TRH of bradycardia, down to 40s while sleeping and in 50s throughout the shift.  Per night coverage, on chart review he has been bradycardic with HR <60 since 1900. Nursing reached out to Cardiology who recommended continuing IV Amiodarone.  Heart rate continues to be in 40s to 50s.  BP 141/88 this morning though was soft overnight.  SpO2 89 to 93% on 2L . Following cardioloyg recs. Stable chronic headache likely medication overuse/MSK.

## 2022-02-02 NOTE — Consult Note (Signed)
ANTICOAGULATION CONSULT NOTE  Pharmacy Consult for Heparin Indication: chest pain/ACS  Allergies  Allergen Reactions   Contrast Media [Iodinated Contrast Media] Shortness Of Breath   Iohexol Shortness Of Breath     Onset Date: [id number]    Jardiance [Empagliflozin] Other (See Comments)    Fatigue/weakness   Atorvastatin Other (See Comments)    Myalgias    Hydrocodone Itching   Morphine And Related Other (See Comments)    Lost control    Penicillins Other (See Comments)    Unknown reaction Did it involve swelling of the face/tongue/throat, SOB, or low BP? Unknown Did it involve sudden or severe rash/hives, skin peeling, or any reaction on the inside of your mouth or nose? Unknown Did you need to seek medical attention at a hospital or doctor's office? Yes When did it last happen?      Childhood allergy  If all above answers are "NO", may proceed with cephalosporin use.    Rosuvastatin Other (See Comments)    Myalgias     Patient Measurements: Height: 5\' 6"  (167.6 cm) Weight: 95.8 kg (211 lb 3.2 oz) IBW/kg (Calculated) : 63.8 Heparin Dosing Weight: 84.6 kg  Vital Signs: Temp: 98.7 F (37.1 C) (07/31 2300) Temp Source: Oral (07/31 2300) BP: 109/62 (07/31 2330) Pulse Rate: 44 (07/31 2330)  Labs: Recent Labs    02/01/22 1550 02/01/22 1729 02/01/22 2033 02/01/22 2249 02/02/22 0245  HGB 15.4  --   --   --  15.2  HCT 44.7  --   --   --  43.9  PLT 217  --   --   --  198  APTT 27  --   --   --   --   LABPROT 14.2  --   --   --   --   INR 1.1  --   --   --   --   HEPARINUNFRC  --   --   --   --  0.53  CREATININE 1.16  --   --   --  0.94  TROPONINIHS 51* 221* 731* 794*  --      Estimated Creatinine Clearance: 84.9 mL/min (by C-G formula based on SCr of 0.94 mg/dL).   Medical History: Past Medical History:  Diagnosis Date   Anginal pain (HCC)    Arthritis    CAD (coronary artery disease)    a. s/p MI with LAD and Diag stenting @ [name];  b. 06/2015 Cath: LAD  41m/d ISR, 100 RCA (ISR) w/ L->R collats, otw mod nonobs dzs-->Med Rx; c. 08/2019 Cath: LM nl, LAD 10p/m ISR, D1 20, D2 100, RI min irregs, LCX 41m/d, OM1 100, OM2 50, RCA 100p, 70d. RPDA fills via collats from LAD. EF 25-35%-->Med Rx; d. 10/2019 NSTEMI/Cath: LCX now 100 (2.75x15 Resolute Onyx DES), otw stable compared to 08/2019.   Chronic combined systolic and diastolic CHF (congestive heart failure) (HCC)    a. 06/2015 Echo: EF 20-25%, Gr3 DD; b. 10/2019 Echo: EF 25-30%, glob HK, sev inf/infapical HK. Mod dil LA.   Dyspnea    Essential hypertension    Headache 12/23/2021   Hyperlipidemia    Hypokalemia    a. 06/2015 in setting of diuresis.   Ischemic cardiomyopathy    a. 2011 EF 45% ([name]);  b. 06/2015 Echo: EF 20-25%; c. 04/2019 Echo: EF 40-45%; d. 08/2019 LV gram: EF 25-35%; e. 10/2019 Echo: EF 25-30%.   PVC's (premature ventricular contractions)    a. 09/2019 Zio (3 days): Avg hr  70, 4 runs NSVT, 5 runs SVT, rare PACs, frequent PVCs w/ 11.8% burden.   Sleep apnea    Stroke [facility])    Stroke/Right temporal lobe infarction [facility])    a. 10/2019 MRI brain: 1cm acute ischemic nonhemorrhagic R temporal lobe infarct. Age-related cerebral atrophy w/ moderate chronic small vessel ischemic dzs.   Type 2 diabetes mellitus with hyperglycemia (HCC) 10/08/2019    Medications:  No history of chronic anticoagulation use PTA  Assessment: Pharmacy has been consulted to initiate and adjust heparin in 66yo patient brought to the ED via EMS for concerns for possible code STEMI. Patient called EMS because of sudden onset pain in his neck and tingling. Troponin levels of 51, 221 respectively. Per Cardiology team, recommended to start heparin infusion as troponin levels continue to rise.  Baseline labs: aPTT 27 sec, INR 1.1, Plts 217, Hgb 15.4  Goal of Therapy:  Heparin level 0.3-0.7 units/ml Monitor platelets by anticoagulation protocol: Yes   Plan:  8/1:  HL @ 0245 = 0.53, therapeutic X 1  Will continue pt on  current rate and recheck HL on 8/1 @ 0900.  Burnard Enis D 02/02/2022,3:34 AM

## 2022-02-02 NOTE — Progress Notes (Signed)
RN messaged to state patient was complaining of small spot in his left thigh that was a bit blurry, no black in vision, states headache has improved with Lidoderm patches.  He is amenable to CT at this point, stroke assessment completed and no concerns, see RN notes.  Will sign out to night team.

## 2022-02-02 NOTE — Assessment & Plan Note (Signed)
New  Flomax  UA  In/out cath or Foley if needed
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
## 2022-02-02 NOTE — Consult Note (Signed)
ANTICOAGULATION CONSULT NOTE  Pharmacy Consult for IV Heparin Indication: chest pain/ACS  Patient Measurements: Height: 5\' 6"  (167.6 cm) Weight: 95.8 kg (211 lb 3.2 oz) IBW/kg (Calculated) : 63.8 Heparin Dosing Weight: 84.6 kg  Labs: Recent Labs    02/01/22 1550 02/01/22 1729 02/01/22 2033 02/01/22 2249 02/02/22 0245  HGB 15.4  --   --   --  15.2  HCT 44.7  --   --   --  43.9  PLT 217  --   --   --  198  APTT 27  --   --   --   --   LABPROT 14.2  --   --   --   --   INR 1.1  --   --   --   --   HEPARINUNFRC  --   --   --   --  0.53  CREATININE 1.16  --   --   --  0.94  TROPONINIHS 51* 221* 731* 794*  --     Estimated Creatinine Clearance: 84.9 mL/min (by C-G formula based on SCr of 0.94 mg/dL).  Medical History: Past Medical History:  Diagnosis Date   Anginal pain (HCC)    Arthritis    CAD (coronary artery disease)    a. s/p MI with LAD and Diag stenting @ [name];  b. 06/2015 Cath: LAD 36m/d ISR, 100 RCA (ISR) w/ L->R collats, otw mod nonobs dzs-->Med Rx; c. 08/2019 Cath: LM nl, LAD 10p/m ISR, D1 20, D2 100, RI min irregs, LCX 105m/d, OM1 100, OM2 50, RCA 100p, 70d. RPDA fills via collats from LAD. EF 25-35%-->Med Rx; d. 10/2019 NSTEMI/Cath: LCX now 100 (2.75x15 Resolute Onyx DES), otw stable compared to 08/2019.   Chronic combined systolic and diastolic CHF (congestive heart failure) (HCC)    a. 06/2015 Echo: EF 20-25%, Gr3 DD; b. 10/2019 Echo: EF 25-30%, glob HK, sev inf/infapical HK. Mod dil LA.   Dyspnea    Essential hypertension    Headache 12/23/2021   Hyperlipidemia    Hypokalemia    a. 06/2015 in setting of diuresis.   Ischemic cardiomyopathy    a. 2011 EF 45% ([name]);  b. 06/2015 Echo: EF 20-25%; c. 04/2019 Echo: EF 40-45%; d. 08/2019 LV gram: EF 25-35%; e. 10/2019 Echo: EF 25-30%.   PVC's (premature ventricular contractions)    a. 09/2019 Zio (3 days): Avg hr 70, 4 runs NSVT, 5 runs SVT, rare PACs, frequent PVCs w/ 11.8% burden.   Sleep apnea    Stroke [facility])     Stroke/Right temporal lobe infarction [facility])    a. 10/2019 MRI brain: 1cm acute ischemic nonhemorrhagic R temporal lobe infarct. Age-related cerebral atrophy w/ moderate chronic small vessel ischemic dzs.   Type 2 diabetes mellitus with hyperglycemia (HCC) 10/08/2019    Medications:  No history of chronic anticoagulation use PTA  Assessment: Pharmacy has been consulted to initiate and adjust heparin in 66 yo patient brought to the ED via EMS for concerns for possible code STEMI. Patient called EMS because of sudden onset pain in his neck and tingling. Troponin levels of 51, 221 respectively. Per cardiology team, recommended to start heparin infusion as troponin levels continue to rise.  Baseline labs: aPTT 27 sec, INR 1.1, Plts 217, Hgb 15.4  Goal of Therapy:  Heparin level 0.3-0.7 units/ml Monitor platelets by anticoagulation protocol: Yes   Plan:  --Heparin level is therapeutic x 2 --Continue heparin infusion at 1100 units/hr --Re-check HL / CBC tomorrow AM  76  [name] 02/02/2022,8:42 AM

## 2022-02-02 NOTE — Assessment & Plan Note (Signed)
D/t bit while receiving defibrillation shocks  Lidocaine viscous

## 2022-02-02 NOTE — Progress Notes (Signed)
Progress Note  Patient Name: [name]. Date of Encounter: 02/02/2022  [facility] Cardiologist: Kathlyn Sacramento, MD   Subjective   Patient sitting at edge of bed this morning. Complains of a headache.  Denies additional chest pain and shortness of breath.  Continues on heparin and amiodarone infusions.   Inpatient Medications    Scheduled Meds:  aspirin EC  81 mg Oral Daily   carvedilol  3.125 mg Oral BID WC   Chlorhexidine Gluconate Cloth  6 each Topical Q0600   ezetimibe  10 mg Oral Daily   furosemide  40 mg Oral Daily   insulin aspart  0-15 Units Subcutaneous Q4H   mometasone-formoterol  2 puff Inhalation BID   And   umeclidinium bromide  1 puff Inhalation Daily   sacubitril-valsartan  1 tablet Oral BID   Continuous Infusions:  amiodarone 30 mg/hr (02/02/22 0529)   heparin 1,100 Units/hr (02/01/22 2131)   PRN Meds: acetaminophen **OR** acetaminophen, albuterol, nitroGLYCERIN, ondansetron **OR** ondansetron (ZOFRAN) IV   Vital Signs    Vitals:   02/02/22 0400 02/02/22 0500 02/02/22 0800 02/02/22 0848  BP: 118/68 112/78 112/64 (!) 141/88  Pulse: (!) 108 (!) 55 (!) 46 (!) 52  Resp: 18 (!) 29 (!) 8   Temp:      TempSrc:      SpO2: (!) 89% 93% 93%   Weight:      Height:        Intake/Output Summary (Last 24 hours) at 02/02/2022 1153 Last data filed at 02/02/2022 0900 Gross per 24 hour  Intake 100 ml  Output 1400 ml  Net -1300 ml      02/01/2022    6:00 PM 01/26/2022    3:26 PM 01/15/2022    9:19 AM  Last 3 Weights  Weight (lbs) 211 lb 3.2 oz 217 lb 214 lb  Weight (kg) 95.8 kg 98.431 kg 97.07 kg      Telemetry    Sinus bradycardia rate 40-50s - Personally Reviewed  ECG    No new tracings - Personally Reviewed  Physical Exam   GEN: No acute distress.   Neck: No JVD Cardiac: RRR, no murmurs, rubs, or gallops.  Respiratory: Clear to auscultation bilaterally. GI: Soft, nontender, non-distended  MS: No edema; No deformity. Neuro:  Nonfocal   Psych: Normal affect   Labs    High Sensitivity Troponin:   Recent Labs  Lab 01/11/22 0002 02/01/22 1550 02/01/22 1729 02/01/22 2033 02/01/22 2249  TROPONINIHS 3,544* 51* 221* 731* 794*     Chemistry Recent Labs  Lab 02/01/22 1550 02/02/22 0245  NA 139 141  K 3.7 3.8  CL 104 110  CO2 23 22  GLUCOSE 284* 128*  BUN 17 14  CREATININE 1.16 0.94  CALCIUM 8.5* 8.2*  MG  --  2.4  PROT 7.4  --   ALBUMIN 3.9  --   AST 28  --   ALT 12  --   ALKPHOS 69  --   BILITOT 1.5*  --   GFRNONAA >60 >60  ANIONGAP 12 9    Lipids  Recent Labs  Lab 02/01/22 1550  CHOL 174  TRIG 230*  HDL 37*  LDLCALC 91  CHOLHDL 4.7    Hematology Recent Labs  Lab 02/01/22 1550 02/02/22 0245  WBC 11.5* 10.0  RBC 5.13 5.07  HGB 15.4 15.2  HCT 44.7 43.9  MCV 87.1 86.6  MCH 30.0 30.0  MCHC 34.5 34.6  RDW 14.6 14.6  PLT 217  198   Thyroid No results for input(s): "TSH", "FREET4" in the last 168 hours.  BNP Recent Labs  Lab 02/01/22 1550  BNP 245.5*    DDimer No results for input(s): "DDIMER" in the last 168 hours.   Radiology    DG Chest Port 1 View  Result Date: 02/01/2022 CLINICAL DATA:  Shortness of breath EXAM: PORTABLE CHEST 1 VIEW COMPARISON:  01/11/2022 FINDINGS: Transverse diameter of Edwyna Shell is increased. Central pulmonary vessels are prominent. There are no signs of alveolar pulmonary edema. There is no focal consolidation. Cardiac monitoring leads are partly obscuring the lung fields. There is no pleural effusion or pneumothorax. Degenerative changes are noted in both AC joints. IMPRESSION: Central pulmonary vessels are prominent without signs of alveolar pulmonary edema. There is no focal pulmonary consolidation. Electronically Signed   By: Ernie Avena [name]   On: 02/01/2022 16:10    Cardiac Studies   RHC with coronary angiography 12/30/21 Severe multivessel coronary artery disease, overall relatively similar to most recent cath in 2021.  Mid LCx stent placed at that  time demonstrates mild in-stent restenosis in the proximal segment.  Eccentric ostial OM2 lesion appears similar. Mildly elevated left heart and pulmonary artery pressures. Mildly reduced Fick cardiac output/index.   Recommendations: Continue indefinite DAPT with aspirin and clopidogrel. Continue gentle diuresis; I will add back furosemide 40 mg daily.  Escalate GDMT for HFrEF, as blood pressure and renal function allow. Add ranolazine 500 mg BID for antianginal therapy.  EKG to be obtained tomorrow AM to reassess QT interval. Consider pulmonary consultation, as degree of dyspnea seems to be out of proportion to heart failure/coronary artery disease. If the patient has refractory symptoms in spite of aforementioned interventions, PCI to OM2 may need to be considered.  If possible, this should be deferred for at least 2 weeks from the time of recent brain MRI demonstrating acute stroke in order to minimize risk for periprocedural intracranial hemorrhage. Aggressive secondary prevention of coronary artery disease.    Myoview 12/24/21 Pharmacological myocardial perfusion imaging study with no significant  ischemia Fixed defect in the inferior, inferolateral and apical region Inferior and inferolateral wall and apical wall hypokinesis , EF estimated at 14% No EKG changes concerning for ischemia at peak stress or in recovery. CT attenuation correction images with three-vessel coronary calcification High risk scan in the setting of cardiomyopathy   Echocardiogram 12/23/21 1. Left ventricular ejection fraction, by estimation, is 30 to 35%. The  left ventricle has moderately decreased function. The left ventricle  demonstrates global hypokinesis. Left ventricular diastolic parameters are  consistent with Grade I diastolic  dysfunction (impaired relaxation).   2. Right ventricular systolic function is normal. The right ventricular  size is normal. Tricuspid regurgitation signal is inadequate for  assessing  PA pressure.   3. The mitral valve is normal in structure. Mild mitral valve  regurgitation. No evidence of mitral stenosis.   4. The aortic valve is normal in structure. Aortic valve regurgitation is  not visualized. Aortic valve sclerosis is present, with no evidence of  aortic valve stenosis.   Patient Profile     65 y.o. male with a hx of CAD status post multiple PCI's, CTO RCA, HFrEF, ICM LVEF 30 to 35%, former smoker, hypertension, hyperlipidemia, type 2 diabetes, PVCs, recent CVA, and medication noncompliance admitted on 02/01/2022 for the evaluation of chest pain with V. tach requiring defibrillation by EMS.  Assessment & Plan    Ventricular tachycardia, HFrEF, ICM LVEF 30 to 35% -Presented  to the emergency department ventricular tachycardia after being shocked by EMS x2 in route -Code STEMI was called on arrival, thought to be likely medication noncompliance versus ischemia as patient just recently had heart catheterization 6/28 -Patient continues on amiodarone drip and is currently in sinus rhythm -Plan to transition back to oral medication on infusion therapy complete -Noted noncompliance of cardiac medications since previous discharge -No need for repeat heart catheterization -Outpatient appointment scheduled for September with the EP for ICD placement  -Restarted 8/1 on PTA carvedilol and Entresto as blood pressure tolerates  -Patient refuses to restart Jardiance -Euvolemic on exam    Medication noncompliance -Patient states he has not had any medications since previous discharge -Have reiterated to patient and son-in-law at the bedside in the emergency department the importance of medication compliance in relation to antiarrhythmics, HFrEF, and ICM with LVEF 30-35%   Recent CVA -Continue aspirin therapy   4. Hyperlipidemia -continue zetia 10 mg daily -LDL 91   5. Hypertension -blood pressure 120/70 -on arrival 151/96 -vitals per unit protocol  6.   Elevated Troponin -Troponin trended upwards overnight 7/31  -51, 221, 731, 794 -Likely d/t Defibrillation x2 -Started on heparin infusion overnight, continue for 48 hours -No plans for repeat cath  For questions or updates, please contact [facility] Please consult [email or web address] for contact info under        Signed, Hudsen Fei, NP  02/02/2022, 11:53 AM

## 2022-02-03 ENCOUNTER — Encounter: Payer: Self-pay | Admitting: Internal Medicine

## 2022-02-03 ENCOUNTER — Other Ambulatory Visit: Payer: Self-pay

## 2022-02-03 DIAGNOSIS — I2510 Atherosclerotic heart disease of native coronary artery without angina pectoris: Secondary | ICD-10-CM

## 2022-02-03 LAB — CBC
HCT: 45 % (ref 39.0–52.0)
Hemoglobin: 15.6 g/dL (ref 13.0–17.0)
MCH: 30 pg (ref 26.0–34.0)
MCHC: 34.7 g/dL (ref 30.0–36.0)
MCV: 86.5 fL (ref 80.0–100.0)
Platelets: 226 10*3/uL (ref 150–400)
RBC: 5.2 MIL/uL (ref 4.22–5.81)
RDW: 14.7 % (ref 11.5–15.5)
WBC: 9 10*3/uL (ref 4.0–10.5)
nRBC: 0 % (ref 0.0–0.2)

## 2022-02-03 LAB — GLUCOSE, CAPILLARY
Glucose-Capillary: 302 mg/dL — ABNORMAL HIGH (ref 70–99)
Glucose-Capillary: 177 mg/dL — ABNORMAL HIGH (ref 70–99)
Glucose-Capillary: 186 mg/dL — ABNORMAL HIGH (ref 70–99)
Glucose-Capillary: 242 mg/dL — ABNORMAL HIGH (ref 70–99)
Glucose-Capillary: 341 mg/dL — ABNORMAL HIGH (ref 70–99)
Glucose-Capillary: 247 mg/dL — ABNORMAL HIGH (ref 70–99)

## 2022-02-03 LAB — BASIC METABOLIC PANEL
Anion gap: 10 (ref 5–15)
BUN: 23 mg/dL (ref 8–23)
CO2: 23 mmol/L (ref 22–32)
Calcium: 8.5 mg/dL — ABNORMAL LOW (ref 8.9–10.3)
Chloride: 104 mmol/L (ref 98–111)
Creatinine, Ser: 1.08 mg/dL (ref 0.61–1.24)
GFR, Estimated: >60 mL/min (ref >60)
Glucose, Bld: 259 mg/dL — ABNORMAL HIGH (ref 70–99)
Potassium: 3.7 mmol/L (ref 3.5–5.1)
Sodium: 137 mmol/L (ref 135–145)

## 2022-02-03 LAB — MAGNESIUM: Magnesium: 2.1 mg/dL (ref 1.7–2.4)

## 2022-02-03 LAB — HEPARIN LEVEL (UNFRACTIONATED)
Heparin Unfractionated: 0.15 IU/mL — ABNORMAL LOW (ref 0.30–0.70)
Heparin Unfractionated: 0.27 IU/mL — ABNORMAL LOW (ref 0.30–0.70)

## 2022-02-03 MED ORDER — AMIODARONE HCL 200 MG PO TABS
400.0000 mg | ORAL_TABLET | Freq: Two times a day (BID) | ORAL | Status: DC
  Administered 2022-02-03 – 2022-02-05 (×5): 400 mg via ORAL
  Filled 2022-02-03 (×5): qty 2

## 2022-02-03 MED ORDER — HEPARIN BOLUS VIA INFUSION
2500.0000 [IU] | Freq: Once | INTRAVENOUS | Status: AC
  Administered 2022-02-03: 2500 [IU] via INTRAVENOUS
  Filled 2022-02-03: qty 2500

## 2022-02-03 MED ORDER — HEPARIN BOLUS VIA INFUSION
1200.0000 [IU] | Freq: Once | INTRAVENOUS | Status: AC
  Administered 2022-02-03: 1200 [IU] via INTRAVENOUS
  Filled 2022-02-03: qty 1200

## 2022-02-03 MED ORDER — TRAMADOL HCL 50 MG PO TABS
50.0000 mg | ORAL_TABLET | Freq: Once | ORAL | Status: AC
  Administered 2022-02-03: 50 mg via ORAL
  Filled 2022-02-03: qty 1

## 2022-02-03 NOTE — Consult Note (Signed)
ANTICOAGULATION CONSULT NOTE  Pharmacy Consult for IV Heparin Indication: chest pain/ACS  Patient Measurements: Height: 5\' 6"  (167.6 cm) Weight: 95.8 kg (211 lb 3.2 oz) IBW/kg (Calculated) : 63.8 Heparin Dosing Weight: 84.6 kg  Labs: Recent Labs    02/01/22 1550 02/01/22 1729 02/01/22 2033 02/01/22 2249 02/02/22 0245 02/02/22 0913 02/03/22 0424  HGB 15.4  --   --   --  15.2  --  15.6  HCT 44.7  --   --   --  43.9  --  45.0  PLT 217  --   --   --  198  --  226  APTT 27  --   --   --   --   --   --   LABPROT 14.2  --   --   --   --   --   --   INR 1.1  --   --   --   --   --   --   HEPARINUNFRC  --   --   --   --  0.53 0.40 0.15*  CREATININE 1.16  --   --   --  0.94  --  1.08  TROPONINIHS 51* 221* 731* 794*  --   --   --     Estimated Creatinine Clearance: 73.9 mL/min (by C-G formula based on SCr of 1.08 mg/dL).  Medical History: Past Medical History:  Diagnosis Date   Anginal pain (HCC)    Arthritis    CAD (coronary artery disease)    a. s/p MI with LAD and Diag stenting @ [name];  b. 06/2015 Cath: LAD 67m/d ISR, 100 RCA (ISR) w/ L->R collats, otw mod nonobs dzs-->Med Rx; c. 08/2019 Cath: LM nl, LAD 10p/m ISR, D1 20, D2 100, RI min irregs, LCX 25m/d, OM1 100, OM2 50, RCA 100p, 70d. RPDA fills via collats from LAD. EF 25-35%-->Med Rx; d. 10/2019 NSTEMI/Cath: LCX now 100 (2.75x15 Resolute Onyx DES), otw stable compared to 08/2019.   Chronic combined systolic and diastolic CHF (congestive heart failure) (HCC)    a. 06/2015 Echo: EF 20-25%, Gr3 DD; b. 10/2019 Echo: EF 25-30%, glob HK, sev inf/infapical HK. Mod dil LA.   Dyspnea    Essential hypertension    Headache 12/23/2021   Hyperlipidemia    Hypokalemia    a. 06/2015 in setting of diuresis.   Ischemic cardiomyopathy    a. 2011 EF 45% ([name]);  b. 06/2015 Echo: EF 20-25%; c. 04/2019 Echo: EF 40-45%; d. 08/2019 LV gram: EF 25-35%; e. 10/2019 Echo: EF 25-30%.   PVC's (premature ventricular contractions)    a. 09/2019 Zio (3  days): Avg hr 70, 4 runs NSVT, 5 runs SVT, rare PACs, frequent PVCs w/ 11.8% burden.   Sleep apnea    Stroke [facility])    Stroke/Right temporal lobe infarction [facility])    a. 10/2019 MRI brain: 1cm acute ischemic nonhemorrhagic R temporal lobe infarct. Age-related cerebral atrophy w/ moderate chronic small vessel ischemic dzs.   Type 2 diabetes mellitus with hyperglycemia (HCC) 10/08/2019    Medications:  No history of chronic anticoagulation use PTA  Assessment: Pharmacy has been consulted to initiate and adjust heparin in 66 yo patient brought to the ED via EMS for concerns for possible code STEMI. Patient called EMS because of sudden onset pain in his neck and tingling. Troponin levels of 51, 221 respectively. Per cardiology team, recommended to start heparin infusion as troponin levels continue to rise.  Baseline labs: aPTT 27 sec,  INR 1.1, Plts 217, Hgb 15.4  Goal of Therapy:  Heparin level 0.3-0.7 units/ml Monitor platelets by anticoagulation protocol: Yes   Plan:  8/2:  HL @ 0424 = 0.15, subtherapeutic  Will order heparin 2500 units IV X 1 bolus and increase drip rate to 1400 units/hr. Will recheck HL 6 hrs after rate change.   [name] D 02/03/2022,5:27 AM

## 2022-02-03 NOTE — Progress Notes (Signed)
Heparin bolus of 2500 units Heparin rate dose change of 1400 units/hr continuous

## 2022-02-03 NOTE — Discharge Instructions (Addendum)

## 2022-02-03 NOTE — Progress Notes (Signed)
   Documentation for [employer] faxed to company at [phone].  Those documents includied, H and P, Echo, cath report, CV strip of VT, progress note and face sheet.  Tresa Endo Rn [facility]

## 2022-02-03 NOTE — Progress Notes (Signed)
Patient is alert and oriented start of this shift. Family members present in room. 5 units of insulin administered.

## 2022-02-03 NOTE — Progress Notes (Signed)
Patient arrives floor at 1:30 am from ICU. Patient is alert and oriented by 4. Vital signs are stable. Patient has Heparin going at 11 ml/hr and Amiodarone at 30 mg/hr via an 18 G PIV in Left AC and 20 G in Right Forearm. Patient on 2L of O 2 . Previously V-Tach in ICU but currently NSR. Patient is a Q4 finger stick.

## 2022-02-03 NOTE — Progress Notes (Signed)
Heparin infusion stopped at 22:15. IV flushed and saline locked

## 2022-02-03 NOTE — Progress Notes (Signed)
Progress Note  Patient Name: Danni Mees. Date of Encounter: 02/03/2022  [facility] Cardiologist: Kathlyn Sacramento, MD   Subjective   Patient seen this morning on rounds.  Denies chest pain.  On 2 L O2 via nasal cannula. Currently on heparin and amiodarone infusions.   Inpatient Medications    Scheduled Meds:  aspirin EC  81 mg Oral Daily   carvedilol  3.125 mg Oral BID WC   Chlorhexidine Gluconate Cloth  6 each Topical Q0600   ezetimibe  10 mg Oral Daily   furosemide  40 mg Oral Daily   insulin aspart  0-15 Units Subcutaneous Q4H   lidocaine  1-2 patch Transdermal Q24H   mometasone-formoterol  2 puff Inhalation BID   And   umeclidinium bromide  1 puff Inhalation Daily   sacubitril-valsartan  1 tablet Oral BID   tamsulosin  0.4 mg Oral QPM   Continuous Infusions:  amiodarone 30 mg/hr (02/03/22 0407)   heparin 1,400 Units/hr (02/03/22 0544)   PRN Meds: acetaminophen **OR** acetaminophen, albuterol, lidocaine, nitroGLYCERIN, ondansetron **OR** ondansetron (ZOFRAN) IV, mouth rinse   Vital Signs    Vitals:   02/03/22 0134 02/03/22 0543 02/03/22 0731 02/03/22 0804  BP: 124/77 108/70 118/68 124/76  Pulse: 62 (!) 57 63 63  Resp: 20 18 16 18   Temp: 97.7 F (36.5 C) 97.7 F (36.5 C) (!) 97.5 F (36.4 C) 98.7 F (37.1 C)  TempSrc: Oral  Oral   SpO2: 97% 93% 91% 100%  Weight:      Height:        Intake/Output Summary (Last 24 hours) at 02/03/2022 1014 Last data filed at 02/03/2022 0900 Gross per 24 hour  Intake 267.67 ml  Output 1550 ml  Net -1282.33 ml      02/01/2022    6:00 PM 01/26/2022    3:26 PM 01/15/2022    9:19 AM  Last 3 Weights  Weight (lbs) 211 lb 3.2 oz 217 lb 214 lb  Weight (kg) 95.8 kg 98.431 kg 97.07 kg      Telemetry    Sinus bradycardia, rate of 50's, 40's overnight, occasional PVC's. - Personally Reviewed  ECG    No new tracings - Personally Reviewed  Physical Exam   GEN: No acute distress.   Neck: No JVD Cardiac: RRR, no  murmurs, rubs, or gallops.  Respiratory: Crackles at bases, diminished lung sounds auscultated bilaterally. On 2L [place].  GI: Soft, nontender, non-distended  MS: No edema; No deformity. Neuro:  Nonfocal  Psych: Normal affect   Labs    High Sensitivity Troponin:   Recent Labs  Lab 01/11/22 0002 02/01/22 1550 02/01/22 1729 02/01/22 2033 02/01/22 2249  TROPONINIHS 3,544* 51* 221* 731* 794*     Chemistry Recent Labs  Lab 02/01/22 1550 02/02/22 0245 02/03/22 0424  NA 139 141 137  K 3.7 3.8 3.7  CL 104 110 104  CO2 23 22 23   GLUCOSE 284* 128* 259*  BUN 17 14 23   CREATININE 1.16 0.94 1.08  CALCIUM 8.5* 8.2* 8.5*  MG  --  2.4 2.1  PROT 7.4  --   --   ALBUMIN 3.9  --   --   AST 28  --   --   ALT 12  --   --   ALKPHOS 69  --   --   BILITOT 1.5*  --   --   GFRNONAA >60 >60 >60  ANIONGAP 12 9 10     Lipids  Recent Labs  Lab 02/01/22 1550  CHOL 174  TRIG 230*  HDL 37*  LDLCALC 91  CHOLHDL 4.7    Hematology Recent Labs  Lab 02/01/22 1550 02/02/22 0245 02/03/22 0424  WBC 11.5* 10.0 9.0  RBC 5.13 5.07 5.20  HGB 15.4 15.2 15.6  HCT 44.7 43.9 45.0  MCV 87.1 86.6 86.5  MCH 30.0 30.0 30.0  MCHC 34.5 34.6 34.7  RDW 14.6 14.6 14.7  PLT 217 198 226   Thyroid No results for input(s): "TSH", "FREET4" in the last 168 hours.  BNP Recent Labs  Lab 02/01/22 1550  BNP 245.5*    DDimer No results for input(s): "DDIMER" in the last 168 hours.   Radiology    CT HEAD WO CONTRAST ( )  Result Date: 02/02/2022 CLINICAL DATA:  Headache EXAM: CT HEAD WITHOUT CONTRAST TECHNIQUE: Contiguous axial images were obtained from the base of the skull through the vertex without intravenous contrast. RADIATION DOSE REDUCTION: This exam was performed according to the departmental dose-optimization program which includes automated exposure control, adjustment of the mA and/or kV according to patient size and/or use of iterative reconstruction technique. COMPARISON:  12/23/2021 FINDINGS:  Brain: There is no mass, hemorrhage or extra-axial collection. There is generalized atrophy without lobar predilection. Chronic white matter changes are advanced for age. Vascular: No abnormal hyperdensity of the major intracranial arteries or dural venous sinuses. No intracranial atherosclerosis. Skull: The visualized skull base, calvarium and extracranial soft tissues are normal. Sinuses/Orbits: Left maxillary and sphenoid sinus mucosal thickening. The orbits are normal. IMPRESSION: 1. No acute intracranial abnormality. 2. Advanced chronic white matter disease for age. Electronically Signed   By: Deatra Robinson [name]   On: 02/02/2022 22:47   DG Chest Port 1 View  Result Date: 02/01/2022 CLINICAL DATA:  Shortness of breath EXAM: PORTABLE CHEST 1 VIEW COMPARISON:  01/11/2022 FINDINGS: Transverse diameter of Edwyna Shell is increased. Central pulmonary vessels are prominent. There are no signs of alveolar pulmonary edema. There is no focal consolidation. Cardiac monitoring leads are partly obscuring the lung fields. There is no pleural effusion or pneumothorax. Degenerative changes are noted in both AC joints. IMPRESSION: Central pulmonary vessels are prominent without signs of alveolar pulmonary edema. There is no focal pulmonary consolidation. Electronically Signed   By: Ernie Avena [name]   On: 02/01/2022 16:10    Cardiac Studies   RHC with coronary angiography 12/30/21 Severe multivessel coronary artery disease, overall relatively similar to most recent cath in 2021.  Mid LCx stent placed at that time demonstrates mild in-stent restenosis in the proximal segment.  Eccentric ostial OM2 lesion appears similar. Mildly elevated left heart and pulmonary artery pressures. Mildly reduced Fick cardiac output/index.   Recommendations: Continue indefinite DAPT with aspirin and clopidogrel. Continue gentle diuresis; I will add back furosemide 40 mg daily.  Escalate GDMT for HFrEF, as blood pressure and renal  function allow. Add ranolazine 500 mg BID for antianginal therapy.  EKG to be obtained tomorrow AM to reassess QT interval. Consider pulmonary consultation, as degree of dyspnea seems to be out of proportion to heart failure/coronary artery disease. If the patient has refractory symptoms in spite of aforementioned interventions, PCI to OM2 may need to be considered.  If possible, this should be deferred for at least 2 weeks from the time of recent brain MRI demonstrating acute stroke in order to minimize risk for periprocedural intracranial hemorrhage. Aggressive secondary prevention of coronary artery disease.    Myoview 12/24/21 Pharmacological myocardial perfusion imaging study with no significant  ischemia  Fixed defect in the inferior, inferolateral and apical region Inferior and inferolateral wall and apical wall hypokinesis , EF estimated at 14% No EKG changes concerning for ischemia at peak stress or in recovery. CT attenuation correction images with three-vessel coronary calcification High risk scan in the setting of cardiomyopathy   Echocardiogram 12/23/21 1. Left ventricular ejection fraction, by estimation, is 30 to 35%. The  left ventricle has moderately decreased function. The left ventricle  demonstrates global hypokinesis. Left ventricular diastolic parameters are  consistent with Grade I diastolic  dysfunction (impaired relaxation).   2. Right ventricular systolic function is normal. The right ventricular  size is normal. Tricuspid regurgitation signal is inadequate for assessing  PA pressure.   3. The mitral valve is normal in structure. Mild mitral valve  regurgitation. No evidence of mitral stenosis.   4. The aortic valve is normal in structure. Aortic valve regurgitation is  not visualized. Aortic valve sclerosis is present, with no evidence of  aortic valve stenosis.   Patient Profile     66 y.o. male  with a hx of CAD status post multiple PCI's, CTO RCA, HFrEF, ICM  LVEF 30 to 35%, former smoker, hypertension, hyperlipidemia, type 2 diabetes, PVCs, recent CVA, and medication noncompliance admitted on 02/01/2022 for the evaluation of chest pain with V. tach requiring defibrillation by EMS.  Assessment & Plan    Ventricular tachycardia, HFrEF, ICM LVEF 30 to 35% -Presented to the emergency department ventricular tachycardia after being shocked by EMS x2 in route -Code STEMI was called on arrival, thought to be likely medication noncompliance versus ischemia as patient just recently had heart catheterization 6/28 -Plan to transition amiodarone infusion to PO today -Start Amiodarone 400 mg BID x 7 days, then 200 mg BID > 200mg  x 7 days -Noted noncompliance of cardiac medications since previous discharge -No need for repeat heart catheterization -Outpatient appointment scheduled for September with the EP for ICD placement  -Restarted 8/1 on PTA carvedilol and Entresto as blood pressure tolerates  -Patient refuses to restart Jardiance -Euvolemic on exam -Plan for Life[facility]est upon discharge to assess for compliance  Medication noncompliance -Patient did not take any medications after previous discharge -Have reiterated to patient and son-in-law at the bedside in the emergency department the importance of medication compliance in relation to antiarrhythmics, HFrEF, and ICM with LVEF 30-35%   Recent CVA -Continue aspirin therapy   Hyperlipidemia -LDL 91 -continue zetia 10 mg daily -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  - continue DAPT with ASA and  PLavix -No plans for repeat cath     For questions or updates, please contact [facility] Please consult [email or web address] for contact info under      Signed, Alina Gilkey 2022, PA-C  02/03/2022, 10:14 AM

## 2022-02-03 NOTE — Progress Notes (Signed)
ANTICOAGULATION CONSULT NOTE  Pharmacy Consult for IV Heparin Indication: chest pain/ACS  Patient Measurements: Height: 5\' 6"  (167.6 cm) Weight: 95.8 kg (211 lb 3.2 oz) IBW/kg (Calculated) : 63.8 Heparin Dosing Weight: 84.6 kg  Labs: Recent Labs    02/01/22 1550 02/01/22 1550 02/01/22 1729 02/01/22 2033 02/01/22 2249 02/02/22 0245 02/02/22 0913 02/03/22 0424 02/03/22 1144  HGB 15.4  --   --   --   --  15.2  --  15.6  --   HCT 44.7  --   --   --   --  43.9  --  45.0  --   PLT 217  --   --   --   --  198  --  226  --   APTT 27  --   --   --   --   --   --   --   --   LABPROT 14.2  --   --   --   --   --   --   --   --   INR 1.1  --   --   --   --   --   --   --   --   HEPARINUNFRC  --    < >  --   --   --  0.53 0.40 0.15* 0.27*  CREATININE 1.16  --   --   --   --  0.94  --  1.08  --   TROPONINIHS 51*  --  221* 731* 794*  --   --   --   --    < > = values in this interval not displayed.    Estimated Creatinine Clearance: 73.9 mL/min (by C-G formula based on SCr of 1.08 mg/dL).  Medical History: Past Medical History:  Diagnosis Date   Anginal pain (HCC)    Arthritis    CAD (coronary artery disease)    a. s/p MI with LAD and Diag stenting @ [name];  b. 06/2015 Cath: LAD 6m/d ISR, 100 RCA (ISR) w/ L->R collats, otw mod nonobs dzs-->Med Rx; c. 08/2019 Cath: LM nl, LAD 10p/m ISR, D1 20, D2 100, RI min irregs, LCX 61m/d, OM1 100, OM2 50, RCA 100p, 70d. RPDA fills via collats from LAD. EF 25-35%-->Med Rx; d. 10/2019 NSTEMI/Cath: LCX now 100 (2.75x15 Resolute Onyx DES), otw stable compared to 08/2019.   Chronic combined systolic and diastolic CHF (congestive heart failure) (HCC)    a. 06/2015 Echo: EF 20-25%, Gr3 DD; b. 10/2019 Echo: EF 25-30%, glob HK, sev inf/infapical HK. Mod dil LA.   Dyspnea    Essential hypertension    Headache 12/23/2021   Hyperlipidemia    Hypokalemia    a. 06/2015 in setting of diuresis.   Ischemic cardiomyopathy    a. 2011 EF 45% ([name]);  b. 06/2015  Echo: EF 20-25%; c. 04/2019 Echo: EF 40-45%; d. 08/2019 LV gram: EF 25-35%; e. 10/2019 Echo: EF 25-30%.   PVC's (premature ventricular contractions)    a. 09/2019 Zio (3 days): Avg hr 70, 4 runs NSVT, 5 runs SVT, rare PACs, frequent PVCs w/ 11.8% burden.   Sleep apnea    Stroke [facility])    Stroke/Right temporal lobe infarction [facility])    a. 10/2019 MRI brain: 1cm acute ischemic nonhemorrhagic R temporal lobe infarct. Age-related cerebral atrophy w/ moderate chronic small vessel ischemic dzs.   Type 2 diabetes mellitus with hyperglycemia (HCC) 10/08/2019    Medications:  No history of chronic anticoagulation  use PTA  Assessment: Pharmacy has been consulted to initiate and adjust heparin in 66 yo patient brought to the ED via EMS for concerns for possible code STEMI. Patient called EMS because of sudden onset pain in his neck and tingling. Troponin levels of 51, 221 respectively. Per cardiology team, recommended to start heparin infusion as troponin levels continue to rise.  Baseline labs: aPTT 27 sec, INR 1.1, Plts 217, Hgb 15.4  Goal of Therapy:  Heparin level 0.3-0.7 units/ml Monitor platelets by anticoagulation protocol: Yes   Date/Time aPTT/HL Comments 8/2@ 0424 HL= 0.15 Sub-thera @ 1100 un/hr 8/2@ 1144 HL= 0.27 Sub-thera @ 1400 un/hr  Plan:  Will order heparin 1200 units IV X 1 bolus then  increase drip rate to 1500 units/hr. No additional level needed.Drip will stop today at 2200.   Wayland Baik Rodriguez-Guzman PharmD, BCPS 02/03/2022 1:23 PM

## 2022-02-03 NOTE — Plan of Care (Signed)
  RD consulted for nutrition education regarding diabetes.   Lab Results  Component Value Date   HGBA1C 8.1 (H) 02/01/2022   Spoke with pt over phone. Pt reports good appetite, however, complains about hospital food ("what y'all are serving isn't food"). Per pt, he already sent back two lunch trays.   PTA pt consumes 2-3 meals per day. He doe snot eat breakfast and meals usually consist of spaghetti or hamburgers. Pt reports he cooks, but also eats out. He drinks only water.   Per pt, he recently received insurance and plans to follow-up with PCP soon. Pt has no questions for RD at this time. Rd informed that educational handouts will be provided. RD also referred to [facility]'s Nutrition and Diabetes Education Services for further support and reinforcement.   RD provided "Carbohydrate Counting for People with Diabetes" handout from the Academy of Nutrition and Dietetics. Discussed different food groups and their effects on blood sugar, emphasizing carbohydrate-containing foods. Provided list of carbohydrates and recommended serving sizes of common foods.  Discussed importance of controlled and consistent carbohydrate intake throughout the day. Provided examples of ways to balance meals/snacks and encouraged intake of high-fiber, whole grain complex carbohydrates. Teach back method used.  Expect fair compliance.  Body mass index is 34.09 kg/m. Pt meets criteria for obesity, class I based on current BMI.  Current diet order is heart healthy/ carb modified (liberalized to carb modified for wider variety of meal selections), patient is consuming approximately 100% of meals at this time. Labs and medications reviewed. No further nutrition interventions warranted at this time. RD contact information provided. If additional nutrition issues arise, please re-consult RD.  Levada Schilling, RD, LDN, CDCES Registered Dietitian II Certified Diabetes Care and Education Specialist Please refer to [facility] for  RD and/or RD on-call/weekend/after hours pager

## 2022-02-03 NOTE — Inpatient Diabetes Management (Addendum)
Inpatient Diabetes Program Recommendations  AACE/ADA: New Consensus Statement on Inpatient Glycemic Control   Target Ranges:  Prepandial:   less than 140 mg/dL      Peak postprandial:   less than 180 mg/dL (1-2 hours)      Critically ill patients:  140 - 180 mg/dL    Latest Reference Range & Units 02/03/22 03:40 02/03/22 08:15  Glucose-Capillary 70 - 99 mg/dL 623 (H) 762 (H)    Latest Reference Range & Units 02/02/22 07:41 02/02/22 11:24 02/02/22 16:16 02/02/22 23:52  Glucose-Capillary 70 - 99 mg/dL 831 (H) 517 (H) 616 (H) 290 (H)   Review of Glycemic Control  Diabetes history: DM2 Outpatient Diabetes medications: None listed; prescribed Metformin 500 mg BID and Jardiance 10 mg daily (per discharge summary on 01/12/22) Current orders for Inpatient glycemic control: Novolog 0-15 units Q4H  Inpatient Diabetes Program Recommendations:    Insulin: Please consider ordering Semglee 5 units Q24H and changing CBGs and Novolog 0-15 units to AC&HS.  NOTE: Patient recently inpatient 01/10/22-01/12/22 and inpatient diabetes coordinator spoke with patient on 01/12/22 during that hospital admission. Patient had been prescribed Metformin and Jardiance; patient reported that he did not ever start the Metformin and that he stopped Jardiance because it made him feel so bad.   Addendum 02/03/22@13 :00-Spoke with patient at bedside about diabetes and home regimen for diabetes control. Patient reports that after he was discharged on 01/12/22 he did NOT take any medications. When asked why, he states that the discharge paperwork was confusing about the medications and he was not sure what to take or discontinue so he opted to not take any of the medications. Patient confirms that he got medications filled through [facility]ication [facility] at discharge on 01/12/22. Patient has a red zipper bag on bedside table with all his medication bottles. He pulled each bottle out to see if he has Metformin and Jardiance and he does have  both Metformin and Jardiance. He also had several bottles with duplicate medications and some bottles were old and labels are not readable. Asked patient to pay close attention to discharge paperwork when he is discharged this time to ensure he understands exactly what he needs to be taking.  Patient noted that his heart doctor had told him to stop the Jardiance because it made him feel bad (reports it made him feel weak, "like I had been beat up").  Patient confirms that he received a new glucometer and testing supplies at discharge on 01/12/22 but he reports that he has not used it at all. Patient asked about using a CGM sensor to monitor glucose. Explained that CGM sensors would be a great tool to provide more information on glucose trends but can be expensive. Patient reports that he recently  got a letter in the mail saying he got Medicaid or Medicare and he has a card at home.  Informed patient that I would let TOC know that he now has insurance to see if it could be updated in the chart.   Discussed A1C results (8.1% on 02/01/22) and explained that current A1C indicates an average glucose of 186 mg/dl over the past 2-3 months. Discussed glucose and A1C goals. Discussed importance of checking CBGs and maintaining good CBG control to prevent long-term and short-term complications. Explained how hyperglycemia leads to damage within blood vessels which lead to the common complications seen with uncontrolled diabetes. Stressed to the patient the importance of improving glycemic control to prevent further complications from uncontrolled diabetes. Discussed impact of  nutrition, exercise, stress, sickness, and medications on diabetes control.  Discussed carbohydrates, carbohydrate goals per day and meal, along with portion sizes. Patient states that getting "shocked" made him realize he has got to do better with taking care of himself and taking medications. Patient reports that he plans to take medications as  prescribed. Patient reports that he would be willing to try Jardiance again and see if he tolerates it better. Patient notes that he has an appointment on 02/12/22 with new PCP to establish care.   Encouraged patient to check glucose at least 2-3 times per day and to take all medications consistently as prescribed at discharge. Patient verbalized understanding of information discussed and reports no further questions at this time related to diabetes.  Thanks, Orlando Penner, RN, MSN, CDCES Diabetes Coordinator Inpatient Diabetes Program [phone] (Team Pager from 8am to 5pm)
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
## 2022-02-04 DIAGNOSIS — I255 Ischemic cardiomyopathy: Secondary | ICD-10-CM

## 2022-02-04 DIAGNOSIS — I10 Essential (primary) hypertension: Secondary | ICD-10-CM

## 2022-02-04 DIAGNOSIS — I25118 Atherosclerotic heart disease of native coronary artery with other forms of angina pectoris: Secondary | ICD-10-CM

## 2022-02-04 DIAGNOSIS — I5023 Acute on chronic systolic (congestive) heart failure: Secondary | ICD-10-CM

## 2022-02-04 DIAGNOSIS — I469 Cardiac arrest, cause unspecified: Secondary | ICD-10-CM

## 2022-02-04 LAB — GLUCOSE, CAPILLARY
Glucose-Capillary: 123 mg/dL — ABNORMAL HIGH (ref 70–99)
Glucose-Capillary: 220 mg/dL — ABNORMAL HIGH (ref 70–99)
Glucose-Capillary: 224 mg/dL — ABNORMAL HIGH (ref 70–99)
Glucose-Capillary: 226 mg/dL — ABNORMAL HIGH (ref 70–99)
Glucose-Capillary: 262 mg/dL — ABNORMAL HIGH (ref 70–99)
Glucose-Capillary: 268 mg/dL — ABNORMAL HIGH (ref 70–99)
Glucose-Capillary: 351 mg/dL — ABNORMAL HIGH (ref 70–99)

## 2022-02-04 MED ORDER — INSULIN GLARGINE-YFGN 100 UNIT/ML ~~LOC~~ SOLN
5.0000 [IU] | Freq: Every day | SUBCUTANEOUS | Status: DC
  Administered 2022-02-04: 5 [IU] via SUBCUTANEOUS
  Filled 2022-02-04 (×2): qty 0.05

## 2022-02-04 NOTE — Inpatient Diabetes Management (Signed)
Inpatient Diabetes Program Recommendations  AACE/ADA: New Consensus Statement on Inpatient Glycemic Control   Target Ranges:  Prepandial:   less than 140 mg/dL      Peak postprandial:   less than 180 mg/dL (1-2 hours)      Critically ill patients:  140 - 180 mg/dL    Latest Reference Range & Units 02/04/22 01:14 02/04/22 03:50 02/04/22 07:49  Glucose-Capillary 70 - 99 mg/dL 092 (H) 957 (H) 473 (H)    Latest Reference Range & Units 02/03/22 08:15 02/03/22 11:39 02/03/22 16:28 02/03/22 20:18  Glucose-Capillary 70 - 99 mg/dL 403 (H) 709 (H) 643 (H) 247 (H)   Review of Glycemic Control  Diabetes history: DM2 Outpatient Diabetes medications:  Metformin 500 mg BID and Jardiance 10 mg daily (patient not taking either Metformin or Jardiance) Current orders for Inpatient glycemic control: Novolog 0-15 units Q4H   Inpatient Diabetes Program Recommendations:     Insulin: Please consider ordering Semglee 5 units Q24H, ordering Novolog 4 units TID with meals for meal coverage if patient eats at least 50% of meals, and changing CBGs and Novolog 0-15 units to AC&HS.   Thanks, Orlando Penner, RN, MSN, CDCES Diabetes Coordinator Inpatient Diabetes Program [phone] (Team Pager from 8am to 5pm)
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
## 2022-02-04 NOTE — TOC Progression Note (Signed)
Transition of Care (TOC) - Progression Note    Patient Details  Name: [name]. MRN: [id number] Date of Birth: March 13, 1956  Transition of Care [facility]) CM/SW Contact  Gildardo Griffes, [place] Phone Number: 02/04/2022, 2:12 PM  Clinical Narrative:      Patients insurance is [employer] family [employer] and no Medicare per follow up with financial counselors.  Per Rayfield Citizen at [facility], this will not cover dme life vest. She recommends reaching out to director for LOG for hospital to cover life vest.   CSW has completed LOG for life vest pending approval or denial at this time.        Expected Discharge Plan and Services                                                 Social Determinants of Health (SDOH) Interventions    Readmission Risk Interventions     No data to display

## 2022-02-04 NOTE — Progress Notes (Signed)
Patient's blood sugar at 1:00 am is 351.

## 2022-02-04 NOTE — Progress Notes (Signed)
Progress Note  Patient Name: [name]. Date of Encounter: 02/04/2022  [facility] Cardiologist: Lorine Bears, MD   Subjective   Patient seen on AM rounds. Denies any chest pain or shortness of breath. Endorses continued headache.   Inpatient Medications    Scheduled Meds:  amiodarone  400 mg Oral BID   aspirin EC  81 mg Oral Daily   carvedilol  3.125 mg Oral BID WC   Chlorhexidine Gluconate Cloth  6 each Topical Q0600   ezetimibe  10 mg Oral Daily   furosemide  40 mg Oral Daily   insulin aspart  0-15 Units Subcutaneous Q4H   lidocaine  1-2 patch Transdermal Q24H   mometasone-formoterol  2 puff Inhalation BID   And   umeclidinium bromide  1 puff Inhalation Daily   sacubitril-valsartan  1 tablet Oral BID   tamsulosin  0.4 mg Oral QPM   Continuous Infusions:  PRN Meds: acetaminophen **OR** acetaminophen, albuterol, lidocaine, nitroGLYCERIN, ondansetron **OR** ondansetron (ZOFRAN) IV, mouth rinse   Vital Signs    Vitals:   02/03/22 2345 02/04/22 0346 02/04/22 0747 02/04/22 1148  BP: 124/68 132/81 127/72 (!) 108/50  Pulse: 67 61 (!) 56 (!) 58  Resp: 19 19 16 16   Temp: (!) 97.5 F (36.4 C) 97.9 F (36.6 C) (!) 97.4 F (36.3 C) 97.9 F (36.6 C)  TempSrc: Oral Oral Oral Oral  SpO2: 92% 100% 90% 92%  Weight:      Height:        Intake/Output Summary (Last 24 hours) at 02/04/2022 1214 Last data filed at 02/04/2022 1100 Gross per 24 hour  Intake 240 ml  Output --  Net 240 ml      02/01/2022    6:00 PM 01/26/2022    3:26 PM 01/15/2022    9:19 AM  Last 3 Weights  Weight (lbs) 211 lb 3.2 oz 217 lb 214 lb  Weight (kg) 95.8 kg 98.431 kg 97.07 kg      Telemetry     Sinus brady to sinus rates of 50-70- Personally Reviewed  ECG    No new tracings - Personally Reviewed  Physical Exam   GEN: No acute distress.   Neck: No JVD Cardiac: RRR, no murmurs, rubs, or gallops.  Respiratory: Clear upper lobes with diminished bases to auscultation bilaterally.  Respirations are unlabored on room air. GI: Soft, nontender, non-distended  MS: Trace edema pretibial ; No deformity. Neuro:  Nonfocal  Psych: Normal affect   Labs    High Sensitivity Troponin:   Recent Labs  Lab 01/11/22 0002 02/01/22 1550 02/01/22 1729 02/01/22 2033 02/01/22 2249  TROPONINIHS 3,544* 51* 221* 731* 794*     Chemistry Recent Labs  Lab 02/01/22 1550 02/02/22 0245 02/03/22 0424  NA 139 141 137  K 3.7 3.8 3.7  CL 104 110 104  CO2 23 22 23   GLUCOSE 284* 128* 259*  BUN 17 14 23   CREATININE 1.16 0.94 1.08  CALCIUM 8.5* 8.2* 8.5*  MG  --  2.4 2.1  PROT 7.4  --   --   ALBUMIN 3.9  --   --   AST 28  --   --   ALT 12  --   --   ALKPHOS 69  --   --   BILITOT 1.5*  --   --   GFRNONAA >60 >60 >60  ANIONGAP 12 9 10     Lipids  Recent Labs  Lab 02/01/22 1550  CHOL 174  TRIG 230*  HDL 37*  LDLCALC 91  CHOLHDL 4.7    Hematology Recent Labs  Lab 02/01/22 1550 02/02/22 0245 02/03/22 0424  WBC 11.5* 10.0 9.0  RBC 5.13 5.07 5.20  HGB 15.4 15.2 15.6  HCT 44.7 43.9 45.0  MCV 87.1 86.6 86.5  MCH 30.0 30.0 30.0  MCHC 34.5 34.6 34.7  RDW 14.6 14.6 14.7  PLT 217 198 226   Thyroid No results for input(s): "TSH", "FREET4" in the last 168 hours.  BNP Recent Labs  Lab 02/01/22 1550  BNP 245.5*    DDimer No results for input(s): "DDIMER" in the last 168 hours.   Radiology    CT HEAD WO CONTRAST ( )  Result Date: 02/02/2022 CLINICAL DATA:  Headache EXAM: CT HEAD WITHOUT CONTRAST TECHNIQUE: Contiguous axial images were obtained from the base of the skull through the vertex without intravenous contrast. RADIATION DOSE REDUCTION: This exam was performed according to the departmental dose-optimization program which includes automated exposure control, adjustment of the mA and/or kV according to patient size and/or use of iterative reconstruction technique. COMPARISON:  12/23/2021 FINDINGS: Brain: There is no mass, hemorrhage or extra-axial collection. There  is generalized atrophy without lobar predilection. Chronic white matter changes are advanced for age. Vascular: No abnormal hyperdensity of the major intracranial arteries or dural venous sinuses. No intracranial atherosclerosis. Skull: The visualized skull base, calvarium and extracranial soft tissues are normal. Sinuses/Orbits: Left maxillary and sphenoid sinus mucosal thickening. The orbits are normal. IMPRESSION: 1. No acute intracranial abnormality. 2. Advanced chronic white matter disease for age. Electronically Signed   By: Deatra Robinson [name]   On: 02/02/2022 22:47    Cardiac Studies   RHC with coronary angiography 12/30/21 Severe multivessel coronary artery disease, overall relatively similar to most recent cath in 2021.  Mid LCx stent placed at that time demonstrates mild in-stent restenosis in the proximal segment.  Eccentric ostial OM2 lesion appears similar. Mildly elevated left heart and pulmonary artery pressures. Mildly reduced Fick cardiac output/index.   Recommendations: Continue indefinite DAPT with aspirin and clopidogrel. Continue gentle diuresis; I will add back furosemide 40 mg daily.  Escalate GDMT for HFrEF, as blood pressure and renal function allow. Add ranolazine 500 mg BID for antianginal therapy.  EKG to be obtained tomorrow AM to reassess QT interval. Consider pulmonary consultation, as degree of dyspnea seems to be out of proportion to heart failure/coronary artery disease. If the patient has refractory symptoms in spite of aforementioned interventions, PCI to OM2 may need to be considered.  If possible, this should be deferred for at least 2 weeks from the time of recent brain MRI demonstrating acute stroke in order to minimize risk for periprocedural intracranial hemorrhage. Aggressive secondary prevention of coronary artery disease.    Myoview 12/24/21 Pharmacological myocardial perfusion imaging study with no significant  ischemia Fixed defect in the inferior,  inferolateral and apical region Inferior and inferolateral wall and apical wall hypokinesis , EF estimated at 14% No EKG changes concerning for ischemia at peak stress or in recovery. CT attenuation correction images with three-vessel coronary calcification High risk scan in the setting of cardiomyopathy   Echocardiogram 12/23/21 1. Left ventricular ejection fraction, by estimation, is 30 to 35%. The  left ventricle has moderately decreased function. The left ventricle  demonstrates global hypokinesis. Left ventricular diastolic parameters are  consistent with Grade I diastolic  dysfunction (impaired relaxation).   2. Right ventricular systolic function is normal. The right ventricular  size is normal. Tricuspid regurgitation signal is inadequate  for assessing  PA pressure.   3. The mitral valve is normal in structure. Mild mitral valve  regurgitation. No evidence of mitral stenosis.   4. The aortic valve is normal in structure. Aortic valve regurgitation is  not visualized. Aortic valve sclerosis is present, with no evidence of  aortic valve stenosis.   Patient Profile     66 y.o. male with a history of CAD s/p multiple PCI's, CTO RCA, HFrEF, ICM LVEF 30-35%, former smoker, HTN, HLD, DM II, PVC's, recent CVA, and medication noncompliance who was admitted on 02/01/22 for the evaluation of chest pain with V-tach requiring defibrillation by EMS  Assessment & Plan    Ventricular tachycardia, HFrEF, ICM LVEF 30-35% -presented to the ED via EMS after being shocked x 2 for v-tach -Code STEMI was called but was cancelled with likely medication noncompliance versus ischemic event -patient endorsed not taking medication since previous discharge -continue amiodarone -outpatient appointment in Sept with EP for evaluation of ICD placement -lifevest ordered 02/03/2022 -Continue entresto and coreg -patient refuses to restart jardiance  Medication noncompliance -patient endorses  noncompliance -previously discussed need for medication compliance with son in law  Recent CVA -continue asa therapy -CT of the head revealed no acute abnormalities -patient continues to endorse headache -management by IM  Hyperlipidemia -ldl 91 -continue zetia 10 mg daily -previous documented statin intolerance d/t statin myopathy  Hypertension -blood pressure 127/72 -no changes made to medication regimen -vitals per unit protocol  Elevated troponins with known severe 3 vessel CAD -Hs troponins trended [phone] -likely secondary to multiple defibrillations -heparin infusion completed after 48 hours of being infused -last LHC 12/30/21 showed severe 3V CAD overall stable -continue DAPT asa and plavix -no plans for repeat cath at this time     For questions or updates, please contact [facility] Please consult [email or web address] for contact info under        Signed, Nino Amano, NP  02/04/2022, 12:14 PM

## 2022-02-05 ENCOUNTER — Other Ambulatory Visit: Payer: Self-pay

## 2022-02-05 DIAGNOSIS — I472 Ventricular tachycardia, unspecified: Principal | ICD-10-CM

## 2022-02-05 LAB — BASIC METABOLIC PANEL
Anion gap: 8 (ref 5–15)
BUN: 19 mg/dL (ref 8–23)
CO2: 22 mmol/L (ref 22–32)
Calcium: 8.5 mg/dL — ABNORMAL LOW (ref 8.9–10.3)
Chloride: 107 mmol/L (ref 98–111)
Creatinine, Ser: 1.05 mg/dL (ref 0.61–1.24)
GFR, Estimated: >60 mL/min (ref >60)
Glucose, Bld: 204 mg/dL — ABNORMAL HIGH (ref 70–99)
Potassium: 3.3 mmol/L — ABNORMAL LOW (ref 3.5–5.1)
Sodium: 137 mmol/L (ref 135–145)

## 2022-02-05 LAB — CBC
HCT: 42.3 % (ref 39.0–52.0)
Hemoglobin: 14.7 g/dL (ref 13.0–17.0)
MCH: 29.9 pg (ref 26.0–34.0)
MCHC: 34.8 g/dL (ref 30.0–36.0)
MCV: 86.2 fL (ref 80.0–100.0)
Platelets: 231 10*3/uL (ref 150–400)
RBC: 4.91 MIL/uL (ref 4.22–5.81)
RDW: 14.7 % (ref 11.5–15.5)
WBC: 9.2 10*3/uL (ref 4.0–10.5)
nRBC: 0 % (ref 0.0–0.2)

## 2022-02-05 LAB — MAGNESIUM: Magnesium: 1.8 mg/dL (ref 1.7–2.4)

## 2022-02-05 LAB — GLUCOSE, CAPILLARY
Glucose-Capillary: 175 mg/dL — ABNORMAL HIGH (ref 70–99)
Glucose-Capillary: 172 mg/dL — ABNORMAL HIGH (ref 70–99)
Glucose-Capillary: 173 mg/dL — ABNORMAL HIGH (ref 70–99)

## 2022-02-05 LAB — PHOSPHORUS: Phosphorus: 3.5 mg/dL (ref 2.5–4.6)

## 2022-02-05 MED ORDER — POTASSIUM CHLORIDE CRYS ER 20 MEQ PO TBCR
40.0000 meq | EXTENDED_RELEASE_TABLET | Freq: Once | ORAL | Status: AC
Start: 2022-02-05 — End: 2022-02-05
  Administered 2022-02-05: 40 meq via ORAL
  Filled 2022-02-05: qty 2

## 2022-02-05 MED ORDER — LOSARTAN POTASSIUM 25 MG PO TABS
25.0000 mg | ORAL_TABLET | Freq: Every day | ORAL | Status: DC
Start: 2022-02-06 — End: 2022-02-05

## 2022-02-05 MED ORDER — AMIODARONE HCL 200 MG PO TABS
400.0000 mg | ORAL_TABLET | Freq: Two times a day (BID) | ORAL | 5 refills | Status: DC
  Filled 2022-02-05: qty 120, 30d supply, fill #0

## 2022-02-05 MED ORDER — POTASSIUM CHLORIDE 20 MEQ PO PACK
40.0000 meq | PACK | Freq: Once | ORAL | Status: AC
  Administered 2022-02-05: 40 meq via ORAL
  Filled 2022-02-05: qty 2

## 2022-02-05 MED ORDER — TAMSULOSIN HCL 0.4 MG PO CAPS
0.4000 mg | ORAL_CAPSULE | Freq: Every evening | ORAL | 1 refills | Status: DC
  Filled 2022-02-05: qty 30, 30d supply, fill #0

## 2022-02-05 MED ORDER — INSULIN GLARGINE-YFGN 100 UNIT/ML ~~LOC~~ SOLN
10.0000 [IU] | Freq: Every day | SUBCUTANEOUS | Status: DC
  Administered 2022-02-05: 10 [IU] via SUBCUTANEOUS
  Filled 2022-02-05: qty 0.1

## 2022-02-05 MED ORDER — MAGNESIUM SULFATE 2 GM/50ML IV SOLN
2.0000 g | Freq: Once | INTRAVENOUS | Status: AC
  Administered 2022-02-05: 2 g via INTRAVENOUS
  Filled 2022-02-05: qty 50
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
## 2022-02-05 NOTE — TOC Progression Note (Signed)
Transition of Care (TOC) - Progression Note    Patient Details  Name: [name]. MRN: [id number] Date of Birth: 1956/02/01  Transition of Care [facility]) CM/SW Contact  Gildardo Griffes, [place] Phone Number: 02/05/2022, 11:35 AM  Clinical Narrative:        Life vest new order form faxed by  cards NP per life vest rep request. Reports that this has to be approved by intake for service rep to see patient, fit with vest and provided patient assistance program information.   Once this is done patient can dc today, pending being fit with life vest.     Expected Discharge Plan and Services                                                 Social Determinants of Health (SDOH) Interventions    Readmission Risk Interventions     No data to display

## 2022-02-05 NOTE — Progress Notes (Signed)
Progress Note  Patient Name: [name]. Date of Encounter: 02/05/2022  [facility] Cardiologist: Lorine Bears, MD   Subjective   Patient seen on a.m. rounds.  Denies any current chest pain or shortness of breath.  Stated that he did have a funny feeling last evening and this morning after receiving one of his medications and is asking for medication changes to be made today.  He is also awaiting the determination on LifeVest to see if they will be able to measure him today for potential discharge.  Inpatient Medications    Scheduled Meds:  amiodarone  400 mg Oral BID   aspirin EC  81 mg Oral Daily   carvedilol  3.125 mg Oral BID WC   Chlorhexidine Gluconate Cloth  6 each Topical Q0600   ezetimibe  10 mg Oral Daily   furosemide  40 mg Oral Daily   insulin aspart  0-15 Units Subcutaneous Q4H   insulin glargine-yfgn  10 Units Subcutaneous Daily   lidocaine  1-2 patch Transdermal Q24H   [START ON 02/06/2022] losartan  25 mg Oral Daily   mometasone-formoterol  2 puff Inhalation BID   And   umeclidinium bromide  1 puff Inhalation Daily   tamsulosin  0.4 mg Oral QPM   Continuous Infusions:  PRN Meds: acetaminophen **OR** acetaminophen, albuterol, lidocaine, nitroGLYCERIN, ondansetron **OR** ondansetron (ZOFRAN) IV, mouth rinse   Vital Signs    Vitals:   02/04/22 2316 02/05/22 0320 02/05/22 0748 02/05/22 1106  BP: 106/60 101/65 111/60 126/81  Pulse: 66 63 (!) 54 (!) 56  Resp: 19 18 18 18   Temp: 98.3 F (36.8 C) (!) 97.5 F (36.4 C) 98 F (36.7 C) 98.1 F (36.7 C)  TempSrc: Oral Oral Oral Oral  SpO2: 93% 93% 98% 97%  Weight:      Height:       No intake or output data in the 24 hours ending 02/05/22 1145    02/01/2022    6:00 PM 01/26/2022    3:26 PM 01/15/2022    9:19 AM  Last 3 Weights  Weight (lbs) 211 lb 3.2 oz 217 lb 214 lb  Weight (kg) 95.8 kg 98.431 kg 97.07 kg      Telemetry    Sinus brady with unifocal PVC's- Personally Reviewed  ECG    No  new tracings - Personally Reviewed  Physical Exam   GEN: No acute distress.   Neck: No JVD Cardiac: RRR, no murmurs, rubs, or gallops.  Respiratory: Clear to auscultation bilaterally.  Respirations are unlabored at rest on room air. GI: Soft, nontender, non-distended  MS: No edema; No deformity. Neuro:  Nonfocal  Psych: Normal affect   Labs    High Sensitivity Troponin:   Recent Labs  Lab 01/11/22 0002 02/01/22 1550 02/01/22 1729 02/01/22 2033 02/01/22 2249  TROPONINIHS 3,544* 51* 221* 731* 794*     Chemistry Recent Labs  Lab 02/01/22 1550 02/02/22 0245 02/03/22 0424 02/05/22 0529  NA 139 141 137 137  K 3.7 3.8 3.7 3.3*  CL 104 110 104 107  CO2 23 22 23 22   GLUCOSE 284* 128* 259* 204*  BUN 17 14 23 19   CREATININE 1.16 0.94 1.08 1.05  CALCIUM 8.5* 8.2* 8.5* 8.5*  MG  --  2.4 2.1 1.8  PROT 7.4  --   --   --   ALBUMIN 3.9  --   --   --   AST 28  --   --   --  ALT 12  --   --   --   ALKPHOS 69  --   --   --   BILITOT 1.5*  --   --   --   GFRNONAA >60 >60 >60 >60  ANIONGAP 12 9 10 8     Lipids  Recent Labs  Lab 02/01/22 1550  CHOL 174  TRIG 230*  HDL 37*  LDLCALC 91  CHOLHDL 4.7    Hematology Recent Labs  Lab 02/02/22 0245 02/03/22 0424 02/05/22 0529  WBC 10.0 9.0 9.2  RBC 5.07 5.20 4.91  HGB 15.2 15.6 14.7  HCT 43.9 45.0 42.3  MCV 86.6 86.5 86.2  MCH 30.0 30.0 29.9  MCHC 34.6 34.7 34.8  RDW 14.6 14.7 14.7  PLT 198 226 231   Thyroid No results for input(s): "TSH", "FREET4" in the last 168 hours.  BNP Recent Labs  Lab 02/01/22 1550  BNP 245.5*    DDimer No results for input(s): "DDIMER" in the last 168 hours.   Radiology    No results found.  Cardiac Studies  RHC with coronary angiography 12/30/21 Severe multivessel coronary artery disease, overall relatively similar to most recent cath in 2021.  Mid LCx stent placed at that time demonstrates mild in-stent restenosis in the proximal segment.  Eccentric ostial OM2 lesion appears  similar. Mildly elevated left heart and pulmonary artery pressures. Mildly reduced Fick cardiac output/index.   Recommendations: Continue indefinite DAPT with aspirin and clopidogrel. Continue gentle diuresis; I will add back furosemide 40 mg daily.  Escalate GDMT for HFrEF, as blood pressure and renal function allow. Add ranolazine 500 mg BID for antianginal therapy.  EKG to be obtained tomorrow AM to reassess QT interval. Consider pulmonary consultation, as degree of dyspnea seems to be out of proportion to heart failure/coronary artery disease. If the patient has refractory symptoms in spite of aforementioned interventions, PCI to OM2 may need to be considered.  If possible, this should be deferred for at least 2 weeks from the time of recent brain MRI demonstrating acute stroke in order to minimize risk for periprocedural intracranial hemorrhage. Aggressive secondary prevention of coronary artery disease.    Myoview 12/24/21 Pharmacological myocardial perfusion imaging study with no significant  ischemia Fixed defect in the inferior, inferolateral and apical region Inferior and inferolateral wall and apical wall hypokinesis , EF estimated at 14% No EKG changes concerning for ischemia at peak stress or in recovery. CT attenuation correction images with three-vessel coronary calcification High risk scan in the setting of cardiomyopathy   Echocardiogram 12/23/21 1. Left ventricular ejection fraction, by estimation, is 30 to 35%. The  left ventricle has moderately decreased function. The left ventricle  demonstrates global hypokinesis. Left ventricular diastolic parameters are  consistent with Grade I diastolic  dysfunction (impaired relaxation).   2. Right ventricular systolic function is normal. The right ventricular  size is normal. Tricuspid regurgitation signal is inadequate for assessing  PA pressure.   3. The mitral valve is normal in structure. Mild mitral valve  regurgitation. No  evidence of mitral stenosis.   4. The aortic valve is normal in structure. Aortic valve regurgitation is  not visualized. Aortic valve sclerosis is present, with no evidence of  aortic valve stenosis.    Patient Profile     66 y.o. male with a history of CAD status post multiple PCI's, CTO of the RCA, HFrEF, ICM LVEF 30 to 35%, former smoker, hypertension, hyperlipidemia, type 2 diabetes, PVCs, recent CVA, and medication noncompliance who is  being seen and evaluated for chest pain with V. tach requiring defibrillation by EMS x2  Assessment & Plan    Ventricular tachycardia, HFrEF, ICM LVEF 30 to 35% -Presented to the ED via EMS after being shocked x2 for V. Tach -Code STEMI was called but was canceled likely medication noncompliance versus ischemic event -Patient endorsed noncompliance with medication -Continue amiodarone -LifeVest ordered on 02/03/2022 with repeat order sent today -Patient should be measured for vest today -Continue Coreg -Also stopped due to intolerance and losartan started at 25 mg daily -Encouraged to continue to keep outpatient appointment in September with the EP for evaluation of ICD implantation  Medication noncompliance -Patient endorses noncompliance and understands recommendations to take medications as prescribed  Recent CVA -Continue aspirin therapy -CT of the head revealed no acute abnormalities -Headache is finally improved this morning -Management per IM  Hyperlipidemia -LDL 91 not at goal -Continue Zetia 10 mg daily -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 -Consider PCSK9 inhibitor in the outpatient setting and hospital follow-up  Hypertension -Blood pressure 111/60 -Entresto discontinued this morning and started on losartan 25 mg daily -Vital signs per unit protocol  Elevated troponins known severe three-vessel CAD, CTO RCA -High-sensitivity troponins trended 51, 221, 731, and 794 -Likely secondary to multiple  defibrillations -Heparin fusion completed after 48 hours -Last [facility] 12/30/2021 showed severe three-vessel CAD overall -Continue DAPT with aspirin Plavix -No further ischemic work-up at this time      For questions or updates, please contact [facility] Please consult [email or web address] for contact info under        Signed, Tyquavious Gamel, NP  02/05/2022, 11:45 AM

## 2022-02-05 NOTE — Progress Notes (Signed)
[employer] rep in room educating patient on life vest.

## 2022-02-05 NOTE — Inpatient Diabetes Management (Signed)
Inpatient Diabetes Program Recommendations  AACE/ADA: New Consensus Statement on Inpatient Glycemic Control   Target Ranges:  Prepandial:   less than 140 mg/dL      Peak postprandial:   less than 180 mg/dL (1-2 hours)      Critically ill patients:  140 - 180 mg/dL    Latest Reference Range & Units 02/05/22 03:23  Glucose-Capillary 70 - 99 mg/dL 836 (H)    Latest Reference Range & Units 02/04/22 07:49 02/04/22 11:47 02/04/22 17:22 02/04/22 19:58 02/04/22 23:46  Glucose-Capillary 70 - 99 mg/dL 629 (H) 476 (H) 546 (H) 268 (H) 220 (H)   Review of Glycemic Control  Diabetes history: DM2 Outpatient Diabetes medications:  Metformin 500 mg BID and Jardiance 10 mg daily (patient not taking either Metformin or Jardiance) Current orders for Inpatient glycemic control: Semglee 5 units daily, Novolog 0-15 units Q4H   Inpatient Diabetes Program Recommendations:     Insulin: Please consider increasing Semglee to 10 units daily, ordering Novolog 4 units TID with meals for meal coverage if patient eats at least 50% of meals, and changing CBGs and Novolog 0-15 units to AC&HS.   Thanks, Orlando Penner, RN, MSN, CDCES Diabetes Coordinator Inpatient Diabetes Program [phone] (Team Pager from 8am to 5pm)

## 2022-02-09 ENCOUNTER — Other Ambulatory Visit: Payer: Self-pay
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
## 2022-02-12 ENCOUNTER — Ambulatory Visit: Payer: Self-pay | Admitting: Nurse Practitioner

## 2022-02-12 DIAGNOSIS — E782 Mixed hyperlipidemia: Secondary | ICD-10-CM

## 2022-02-12 DIAGNOSIS — E1165 Type 2 diabetes mellitus with hyperglycemia: Secondary | ICD-10-CM

## 2022-02-12 DIAGNOSIS — I472 Ventricular tachycardia, unspecified: Secondary | ICD-10-CM

## 2022-02-12 DIAGNOSIS — I5022 Chronic systolic (congestive) heart failure: Secondary | ICD-10-CM

## 2022-02-12 DIAGNOSIS — Z7689 Persons encountering health services in other specified circumstances: Secondary | ICD-10-CM

## 2022-02-12 DIAGNOSIS — I4729 Other ventricular tachycardia: Secondary | ICD-10-CM

## 2022-02-12 DIAGNOSIS — I200 Unstable angina: Secondary | ICD-10-CM

## 2022-03-02 ENCOUNTER — Ambulatory Visit: Payer: Medicaid Other | Admitting: Cardiovascular Disease

## 2022-03-02 NOTE — Progress Notes (Unsigned)
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 dizziness concerning for recurrent episodes of VT.  He has self discontinued the LifeVest despite this, and declines reinitiation of LifeVest at today's visit.  He is aware this may lead to death and accepts this.  He is of sound mind.  He will follow-up with EP next month for further discussion of ICD implantation.  He reports adherence to medications.  We will decrease amiodarone to 200 mg twice daily as he has now completed a 10 g load.  He will otherwise continue current dose carvedilol.  Check BMP and magnesium with recommendation to replete potassium and magnesium to goal 4.0 and 2.0, respectively.  No indication for repeat ischemic evaluation given recent LHC in 12/2021 as outlined above.  CAD involving the native coronary arteries without angina: He is without symptoms of angina or decompensation.  Continue aggressive risk factor modification and secondary prevention including aspirin, carvedilol,  ezetimibe, and Entresto.  He is intolerant to statin.  HFrEF secondary to ICM: He appears euvolemic and well compensated.  He has historically been medication noncompliant and wants to minimize medications as much as possible, if take them at all.  Given this, he has not been maintained on the spironolactone any further.  However, he does appear to be more open to medications at this time.  For now, we will continue him on carvedilol, Entresto, [place], and as needed furosemide.  Check BMP.  Moving forward, we could look to reintroduce the idea of addition of spironolactone down the road.  He will plan to follow-up with the EP for ICD discussion as outlined above.  Medication nonadherence: Historically, he has been medication nonadherent and asked to minimize medications as well.  He reports adherence to medications at this time.  History of CVA: No new deficits.  He remains on aspirin and ezetimibe.  HTN: Blood pressure is reasonably controlled in the office today.  Continue current medical therapy as outlined above.  HLD: LDL 91.  Intolerant to statins.  He remains on ezetimibe.    Disposition: F/u with Dr. Fletcher Anon or an APP in 3 months.   Medication Adjustments/Labs and Tests Ordered: Current medicines are reviewed at length with the patient today.  Concerns regarding medicines are outlined above. Medication changes, Labs and Tests ordered today are summarized above and listed in the Patient Instructions accessible in Encounters.   Signed, Christell Faith, PA-C 03/03/2022 10:25 AM     [facility] [address] [place], [place] [address] [phone]

## 2022-03-03 ENCOUNTER — Other Ambulatory Visit: Payer: Self-pay

## 2022-03-03 ENCOUNTER — Ambulatory Visit: Payer: Medicaid Other | Attending: Physician Assistant | Admitting: Physician Assistant

## 2022-03-03 ENCOUNTER — Other Ambulatory Visit
Admission: RE | Admit: 2022-03-03 | Discharge: 2022-03-03 | Disposition: A | Discharge status: Home / Self Care | Payer: Medicaid Other | Source: Ambulatory Visit | Attending: Physician Assistant | Admitting: Physician Assistant

## 2022-03-03 ENCOUNTER — Encounter: Payer: Self-pay | Admitting: Physician Assistant

## 2022-03-03 ENCOUNTER — Telehealth: Payer: Self-pay

## 2022-03-03 VITALS — BP 142/78 | HR 56 | Ht 66.0 in | Wt 213.6 lb

## 2022-03-03 DIAGNOSIS — I5020 Unspecified systolic (congestive) heart failure: Secondary | ICD-10-CM

## 2022-03-03 DIAGNOSIS — I255 Ischemic cardiomyopathy: Secondary | ICD-10-CM | POA: Diagnosis not present

## 2022-03-03 DIAGNOSIS — I472 Ventricular tachycardia, unspecified: Secondary | ICD-10-CM

## 2022-03-03 DIAGNOSIS — Z91148 Patient's other noncompliance with medication regimen for other reason: Secondary | ICD-10-CM

## 2022-03-03 DIAGNOSIS — I2510 Atherosclerotic heart disease of native coronary artery without angina pectoris: Secondary | ICD-10-CM

## 2022-03-03 DIAGNOSIS — E785 Hyperlipidemia, unspecified: Secondary | ICD-10-CM

## 2022-03-03 DIAGNOSIS — Z8673 Personal history of transient ischemic attack (TIA), and cerebral infarction without residual deficits: Secondary | ICD-10-CM

## 2022-03-03 DIAGNOSIS — I10 Essential (primary) hypertension: Secondary | ICD-10-CM

## 2022-03-03 LAB — BASIC METABOLIC PANEL
Anion gap: 8 (ref 5–15)
BUN: 21 mg/dL (ref 8–23)
CO2: 20 mmol/L — ABNORMAL LOW (ref 22–32)
Calcium: 9.2 mg/dL (ref 8.9–10.3)
Chloride: 112 mmol/L — ABNORMAL HIGH (ref 98–111)
Creatinine, Ser: 1.15 mg/dL (ref 0.61–1.24)
GFR, Estimated: >60 mL/min (ref >60)
Glucose, Bld: 176 mg/dL — ABNORMAL HIGH (ref 70–99)
Potassium: 4.3 mmol/L (ref 3.5–5.1)
Sodium: 140 mmol/L (ref 135–145)

## 2022-03-03 LAB — MAGNESIUM: Magnesium: 2.3 mg/dL (ref 1.7–2.4)

## 2022-03-03 MED ORDER — AMIODARONE HCL 200 MG PO TABS
200.0000 mg | ORAL_TABLET | Freq: Two times a day (BID) | ORAL | 5 refills | Status: DC
  Filled 2022-03-03: qty 120, 60d supply, fill #0

## 2022-03-03 MED ORDER — ENTRESTO 49-51 MG PO TABS: 1.0000 | ORAL_TABLET | Freq: Two times a day (BID) | ORAL | Status: DC

## 2022-03-03 NOTE — Telephone Encounter (Signed)
Called patient and informed him of the result note below:  Potassium improved and at goal Magnesium at goal Renal function stable Random glucose elevated with known diabetes  Patient inquired about his Entresto dose as he thought he was supposed to be taking 24-26 MG twice a day, but stated he just picked up a bottle that is 49-51 MG twice a day. I discussed with Alycia Rossetti and after reviewing patients chart, he advised the patient go ahead and take the 49-51 MG dose twice a day. Advised patient of the recommendation and he stated that he has a couple bottles of that dose and will call us when he needs a refill.

## 2022-03-03 NOTE — Patient Instructions (Signed)
Medication Instructions:   Your physician has recommended you make the following change in your medication:   1.  DECREASE your Amiodarone to 200 MG twice a day.  *If you need a refill on your cardiac medications before your next appointment, please call your pharmacy*   Lab Work:  Please go to the [facility] after your appointment today for a Lab (BMP, Magnesium) draw.    Follow-Up: At [facility], you and your health needs are our priority.  As part of our continuing mission to provide you with exceptional heart care, we have created designated Provider Care Teams.  These Care Teams include your primary Cardiologist (physician) and Advanced Practice Providers (APPs -  Physician Assistants and Nurse Practitioners) who all work together to provide you with the care you need, when you need it.  We recommend signing up for the patient portal called "MyChart".  Sign up information is provided on this After Visit Summary.  MyChart is used to connect with patients for Virtual Visits (Telemedicine).  Patients are able to view lab/test results, encounter notes, upcoming appointments, etc.  Non-urgent messages can be sent to your provider as well.   To learn more about what you can do with MyChart, go to [email or web address].    Your next appointment:     Follow up with EP as scheduled   Follow up with Dr. Kirke Corin or Eula Listen, PA in 3 months.  The format for your next appointment:   In Person    Important Information About Sugar

## 2022-03-07 NOTE — Progress Notes (Unsigned)
Patient ID: Glen Flemings., male    DOB: 1956/02/28, 66 y.o.   MRN: [id number]  HPI  [name] is a 65 y/o male with a history of CAD, HTN, hyperlipidemia, stroke, hypokalemia, previous tobacco use and chronic heart failure.   Echo report from 12/23/21 reviewed and showed an EF of 30-35% along with mild Glen. Echo report from 06/19/21 reviewed and showed an EF of 30-35% along with moderate LVH and mild/moderate Glen. Echo report from 05/04/2019 reviewed and showed an EF of 40-45%. Echo report from 04/22/20 reviewed and showed an EF of 25-30% along with mild/moderate Glen. Echo report from 10/07/19 reviewed and showed an EF of 25-30%. Echo report from 05/04/2019 reviewed and showed an EF of 40-45% along with mild Glen. Echo report from 06/07/15 reviewed and showed an EF of 20-25% along with moderate Glen and mild TR.   RHC done 12/30/21 showed: Severe multivessel coronary artery disease, overall relatively similar to most recent cath in 2021.  Mid LCx stent placed at that time demonstrates mild in-stent restenosis in the proximal segment.  Eccentric ostial OM2 lesion appears similar. Mildly elevated left heart and pulmonary artery pressures. Mildly reduced Fick cardiac output/index.  LHC 10/08/19 showed: 1st Diag lesion is 60% stenosed. Prox LAD to Mid LAD lesion is 10% stenosed. 2nd Diag lesion is 100% stenosed. 1st Mrg lesion is 100% stenosed. Mid Cx to Dist Cx lesion is 100% stenosed. Prox RCA to Mid RCA lesion is 100% stenosed. Dist RCA lesion is 70% stenosed. 2nd Mrg lesion is 60% stenosed. Post intervention, there is a 0% residual stenosis. A drug-eluting stent was successfully placed using a STENT RESOLUTE ONYX [id number].  Admitted 02/01/22 due to sudden onset of neck pain and tingling. En route to [facility] ED he went into pulselss VT and required electrical cardioversion x2 in the field. Cardiology consult obtained. Heparin drip started. Lifevest placed. Discharged after 4 days. Admitted 01/10/22 due to chest  discomfort following wasps bite. Upon arriving the hospital, he was tachycardiac at 175 bpm, telemetry showed a wide complex ventricular tachycardia.  Patient was given 120 J of a cardioversion, which need repeated x1.  He also received 150 mg amiodarone IV, ventricular tachycardia was terminated afterwards. Cardiology consult obtained. Started on amiodarone drip with transition to oral medications. Elevated troponin thought to be due to VT. Given steroids and epi injection with improvement. Discharged after 2 days. Admitted 12/23/21 due to chest pain and shortness of breath. Initially given IV lasix with transition to oral diuretics. Brain MRI done as he had an unsteady gait which was positive for stroke. Cardiology and neurology consults obtained. Cath done which showed stable disease. PT/OT evaluations done. Discharged after 8 days.   He presents today for follow up visit with a chief complaint of   Past Medical History:  Diagnosis Date   Anginal pain (HCC)    Arthritis    CAD (coronary artery disease)    a. s/p MI with LAD and Diag stenting @ [name];  b. 06/2015 Cath: LAD 50m/d ISR, 100 RCA (ISR) w/ L->R collats, otw mod nonobs dzs-->Med Rx; c. 08/2019 Cath: LM nl, LAD 10p/m ISR, D1 20, D2 100, RI min irregs, LCX 17m/d, OM1 100, OM2 50, RCA 100p, 70d. RPDA fills via collats from LAD. EF 25-35%-->Med Rx; d. 10/2019 NSTEMI/Cath: LCX now 100 (2.75x15 Resolute Onyx DES), otw stable compared to 08/2019.   Chronic combined systolic and diastolic CHF (congestive heart failure) (HCC)    a. 06/2015 Echo: EF  20-25%, Gr3 DD; b. 10/2019 Echo: EF 25-30%, glob HK, sev inf/infapical HK. Mod dil LA.   Dyspnea    Essential hypertension    Headache 12/23/2021   Hyperlipidemia    Hypokalemia    a. 06/2015 in setting of diuresis.   Ischemic cardiomyopathy    a. 2011 EF 45% ([name]);  b. 06/2015 Echo: EF 20-25%; c. 04/2019 Echo: EF 40-45%; d. 08/2019 LV gram: EF 25-35%; e. 10/2019 Echo: EF 25-30%.   PVC's (premature  ventricular contractions)    a. 09/2019 Zio (3 days): Avg hr 70, 4 runs NSVT, 5 runs SVT, rare PACs, frequent PVCs w/ 11.8% burden.   Sleep apnea    Stroke [facility])    Stroke/Right temporal lobe infarction [facility])    a. 10/2019 MRI brain: 1cm acute ischemic nonhemorrhagic R temporal lobe infarct. Age-related cerebral atrophy w/ moderate chronic small vessel ischemic dzs.   Type 2 diabetes mellitus with hyperglycemia (HCC) 10/08/2019   Past Surgical History:  Procedure Laterality Date   BACK SURGERY  04/2019   CARDIAC CATHETERIZATION N/A 06/10/2015   Procedure: Left Heart Cath;  Surgeon: Glen Ouch, MD;  Location: [facility] INVASIVE CV LAB;  Service: Cardiovascular;  Laterality: N/A;   CORONARY STENT INTERVENTION N/A 10/08/2019   Procedure: CORONARY STENT INTERVENTION;  Surgeon: Glen Ouch, MD;  Location: [facility] INVASIVE CV LAB;  Service: Cardiovascular;  Laterality: N/A;   CORONARY STENT PLACEMENT     LEFT HEART CATH AND CORONARY ANGIOGRAPHY N/A 10/08/2019   Procedure: LEFT HEART CATH AND CORONARY ANGIOGRAPHY poss pci;  Surgeon: Glen Ouch, MD;  Location: [facility] INVASIVE CV LAB;  Service: Cardiovascular;  Laterality: N/A;   RIGHT HEART CATH AND CORONARY ANGIOGRAPHY N/A 12/30/2021   Procedure: RIGHT HEART CATH AND CORONARY ANGIOGRAPHY;  Surgeon: Glen Kendall, MD;  Location: [facility] INVASIVE CV LAB;  Service: Cardiovascular;  Laterality: N/A;   RIGHT/LEFT HEART CATH AND CORONARY ANGIOGRAPHY N/A 08/20/2019   Procedure: RIGHT/LEFT HEART CATH AND CORONARY ANGIOGRAPHY;  Surgeon: Glen Ouch, MD;  Location: [facility] INVASIVE CV LAB;  Service: Cardiovascular;  Laterality: N/A;   Family History  Problem Relation Age of Onset   Coronary artery disease Mother    Diabetes Mother    Coronary artery disease Father    Social History   Tobacco Use   Smoking status: Former    Types: Cigarettes    Quit date: 06/10/2011    Years since quitting: 10.7   Smokeless tobacco: Never  Substance Use Topics    Alcohol use: No    Alcohol/week: 0.0 standard drinks of alcohol   Allergies  Allergen Reactions   Contrast Media [Iodinated Contrast Media] Shortness Of Breath   Iohexol Shortness Of Breath     Onset Date: [id number]    Jardiance [Empagliflozin] Other (See Comments)    Fatigue/weakness   Atorvastatin Other (See Comments)    Myalgias    Hydrocodone Itching   Morphine And Related Other (See Comments)    Lost control    Penicillins Other (See Comments)    Unknown reaction Did it involve swelling of the face/tongue/throat, SOB, or low BP? Unknown Did it involve sudden or severe rash/hives, skin peeling, or any reaction on the inside of your mouth or nose? Unknown Did you need to seek medical attention at a hospital or doctor's office? Yes When did it last happen?      Childhood allergy  If all above answers are "NO", may proceed with cephalosporin use.    Rosuvastatin Other (See Comments)  Myalgias      Review of Systems  Constitutional:  Positive for fatigue. Negative for appetite change.  HENT:  Negative for congestion, postnasal drip and sore throat.   Eyes: Negative.   Respiratory:  Negative for cough, shortness of breath and wheezing.   Cardiovascular:  Negative for chest pain, palpitations and leg swelling.  Gastrointestinal:  Negative for abdominal distention.  Endocrine: Negative.   Genitourinary: Negative.   Musculoskeletal:  Positive for arthralgias (left shoulder), back pain and neck pain.  Skin: Negative.   Allergic/Immunologic: Negative.   Neurological:  Positive for dizziness (when changing positions too quickly) and headaches (with amiodarone). Negative for light-headedness.  Hematological:  Negative for adenopathy. Does not bruise/bleed easily.  Psychiatric/Behavioral:  Positive for sleep disturbance (sleeping on 3 pillows). Negative for dysphoric mood. The patient is not nervous/anxious.       Physical Exam Vitals and nursing note reviewed.   Constitutional:      Appearance: He is well-developed.  HENT:     Head: Normocephalic and atraumatic.  Cardiovascular:     Rate and Rhythm: Normal rate and regular rhythm.  Pulmonary:     Effort: Pulmonary effort is normal. No accessory muscle usage.     Breath sounds: No rhonchi or rales.  Abdominal:     Palpations: Abdomen is soft.     Tenderness: There is no abdominal tenderness.  Musculoskeletal:     Cervical back: Normal range of motion and neck supple.     Right lower leg: No tenderness. No edema.     Left lower leg: No tenderness. No edema.  Skin:    General: Skin is warm and dry.  Neurological:     General: No focal deficit present.     Mental Status: He is alert and oriented to person, place, and time.  Psychiatric:        Mood and Affect: Mood normal.        Behavior: Behavior normal.    Assessment & Plan:  1: Chronic heart failure with reduced ejection fraction- - NYHA class II - euvolemic today - continues to not weigh daily; instructed to resume weighing daily so that he can call for an overnight weight gain of >2 pounds or a weekly weight gain of >5 pounds - weight 214 pounds from last visit here 6 weeks ago - not adding salt to his food but does eat out "often" as he lives alone - trying to keep daily fluid intake to 60-64 ounces - BNP done 02/01/22 was 245.5  2: HTN- - BP  - hasn't seen PCP [name]) since 2018 & his office will no longer see the patient - NS with PCP [name]) 02/12/22  - BMP 03/03/22 reviewed and showed sodium 140, potassium 4.3, creatinine 1.15 and GFR >60  3: CAD- - NSTEMI April 2021 - saw cardiology ([name]) 03/03/22  4: VT- - was shocked during recent admission - has the amiodarone but hasn't been taking it consistently due to it causing headaches - has EP appointment on 03/17/22  5: COPD- - has pulmonology appointment [name]) on 03/15/22   Patient did not bring his medications nor a list. Each medication was verbally  reviewed with the patient and he was encouraged to bring the bottles to every visit to confirm accuracy of list.

## 2022-03-09 ENCOUNTER — Encounter: Payer: Self-pay | Admitting: Family

## 2022-03-09 ENCOUNTER — Ambulatory Visit: Payer: Medicaid Other | Attending: Family | Admitting: Family

## 2022-03-09 VITALS — BP 105/67 | HR 61 | Resp 14 | Ht 66.0 in | Wt 213.4 lb

## 2022-03-09 DIAGNOSIS — I5020 Unspecified systolic (congestive) heart failure: Secondary | ICD-10-CM

## 2022-03-09 DIAGNOSIS — I2510 Atherosclerotic heart disease of native coronary artery without angina pectoris: Secondary | ICD-10-CM | POA: Insufficient documentation

## 2022-03-09 DIAGNOSIS — I10 Essential (primary) hypertension: Secondary | ICD-10-CM

## 2022-03-09 DIAGNOSIS — Z8673 Personal history of transient ischemic attack (TIA), and cerebral infarction without residual deficits: Secondary | ICD-10-CM | POA: Insufficient documentation

## 2022-03-09 DIAGNOSIS — R2681 Unsteadiness on feet: Secondary | ICD-10-CM | POA: Insufficient documentation

## 2022-03-09 DIAGNOSIS — J449 Chronic obstructive pulmonary disease, unspecified: Secondary | ICD-10-CM | POA: Diagnosis not present

## 2022-03-09 DIAGNOSIS — I472 Ventricular tachycardia, unspecified: Secondary | ICD-10-CM | POA: Diagnosis not present

## 2022-03-09 DIAGNOSIS — Z20822 Contact with and (suspected) exposure to covid-19: Secondary | ICD-10-CM | POA: Diagnosis not present

## 2022-03-09 DIAGNOSIS — I252 Old myocardial infarction: Secondary | ICD-10-CM | POA: Diagnosis not present

## 2022-03-09 DIAGNOSIS — E785 Hyperlipidemia, unspecified: Secondary | ICD-10-CM | POA: Insufficient documentation

## 2022-03-09 DIAGNOSIS — I110 Hypertensive heart disease with heart failure: Secondary | ICD-10-CM | POA: Insufficient documentation

## 2022-03-09 DIAGNOSIS — R112 Nausea with vomiting, unspecified: Secondary | ICD-10-CM | POA: Diagnosis not present

## 2022-03-09 NOTE — Patient Instructions (Signed)
Continue weighing daily and call for an overnight weight gain of 3 pounds or more or a weekly weight gain of more than 5 pounds.   If you have voicemail, please make sure your mailbox is cleaned out so that we may leave a message and please make sure to listen to any voicemails.     

## 2022-03-15 ENCOUNTER — Ambulatory Visit (INDEPENDENT_AMBULATORY_CARE_PROVIDER_SITE_OTHER): Payer: Medicaid Other | Admitting: Pulmonary Disease

## 2022-03-15 ENCOUNTER — Encounter: Payer: Self-pay | Admitting: Pulmonary Disease

## 2022-03-15 VITALS — BP 152/80 | HR 53 | Temp 97.7°F | Ht 66.0 in | Wt 211.0 lb

## 2022-03-15 DIAGNOSIS — I255 Ischemic cardiomyopathy: Secondary | ICD-10-CM

## 2022-03-15 DIAGNOSIS — J449 Chronic obstructive pulmonary disease, unspecified: Secondary | ICD-10-CM

## 2022-03-15 DIAGNOSIS — Z9189 Other specified personal risk factors, not elsewhere classified: Secondary | ICD-10-CM

## 2022-03-15 DIAGNOSIS — I5022 Chronic systolic (congestive) heart failure: Secondary | ICD-10-CM | POA: Diagnosis not present

## 2022-03-15 DIAGNOSIS — R0602 Shortness of breath: Secondary | ICD-10-CM

## 2022-03-15 NOTE — Progress Notes (Signed)
Subjective:    Patient ID: [name]., male    DOB: [birth year], 66 y.o.   MRN: [id number] Patient Care Team: Charlynne Cousins, MD (Inactive) as PCP - General (Internal Medicine) Wellington Hampshire, MD as PCP - Cardiology (Cardiology)  Chief Complaint  Patient presents with   Follow-up   HPI Patient is a 66 year old former smoker with a 60-pack-year smoking history first visit here to [facility], seen by Dr. Patricia Pesa during a recent hospitalization on 30 December 2021.  Dr. Mortimer Fries was consulted for patient's ongoing shortness of breath.  Per Dr. Zoila Shutter consultation observations were as follows: "66 year old man with history of chronic systolic congestive heart failure, cardiomyopathy, hypertension, type 2 diabetes mellitus, CAD.   He presented to the hospital on June 21 with chest pain and shortness of breath Fixed defect noted on stress test done inferolateral and apical region with a EF of 25% Echocardiogram shows EF of 30 to 35% with global hypokinesis Cardiology evaluated patient started on Lasix therapy During admission he developed unsteady gait MRI of the brain shows evidence of stroke left frontal area Currently patient is alert and awake following commands Cardiac catheter today shows no significant changes from previous catheter 2021 Pulmonary consulted for ongoing shortness of breath   At this time is multifactorial including ischemic cardiomyopathy with a EF of 30% morbid obesity in the setting of stroke deconditioned state Less likely PE less likely COPD exacerbation at this time and less likely pneumonia   Patient denies fevers chills nausea vomiting diarrhea Patient asking to go home   At this time patient requires minimal oxygen therapy I have explained to him he may need oxygen therapy upon discharge-follow-up with pulmonary as outpatient   No indication for bronchoscopy steroids or antibiotics at this time Patient is a former smoker and may have  COPD We will start inhaler therapy "  During the visit today the patient states that he has had shortness of breath for approximately a year.  He notes that he breathes better in cooler temperatures.  Inhalers and nebulizers do not help his symptoms whatsoever.  He has no issues with cough or sputum production.  He has had severe issues with cardiac disease and has required several stents.  He is noted to have an ischemic cardiomyopathy with LVEF of approximately 30%.  He states that when he first developed shortness of breath he had a stent placed and after that he felt great.  However he had subsequent problems and a second stent was placed but he felt no improvement whatsoever on his symptoms.  He states that during his hospitalization they told him that his oxygen dropped at nighttime.  He notes that he sleeps but he does not feel rested in the mornings he has significant orthopnea and has to sleep on 3 pillows.  He notes frequent nocturnal awakenings with shortness of breath and feeling that he is gasping.  He has not had any fevers, chills or sweats.  No recent chest pain since his admission in June.  He has not had any weight loss or anorexia.  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  He does not have any military history.  Review of Systems A 10 point review of systems was performed and it is as noted above otherwise negative.  Past Medical History:  Diagnosis Date   Anginal pain ([place])    Arthritis    CAD (coronary artery disease)  a. s/p MI with LAD and Diag stenting @ [name];  b. 06/2015 Cath: LAD 68m/d ISR, 100 RCA (ISR) w/ L->R collats, otw mod nonobs dzs-->Med Rx; c. 08/2019 Cath: LM nl, LAD 10p/m ISR, D1 20, D2 100, RI min irregs, LCX 40m/d, OM1 100, OM2 50, RCA 100p, 70d. RPDA fills via collats from LAD. EF 25-35%-->Med Rx; d. 10/2019 NSTEMI/Cath: LCX now 100 (2.75x15 Resolute Onyx DES), otw stable compared to 08/2019.   Chronic combined systolic and diastolic  CHF (congestive heart failure) ([place])    a. 06/2015 Echo: EF 20-25%, Gr3 DD; b. 10/2019 Echo: EF 25-30%, glob HK, sev inf/infapical HK. Mod dil LA.   Dyspnea    Essential hypertension    Headache 12/23/2021   Hyperlipidemia    Hypokalemia    a. 06/2015 in setting of diuresis.   Ischemic cardiomyopathy    a. 2011 EF 45% ([name]);  b. 06/2015 Echo: EF 20-25%; c. 04/2019 Echo: EF 40-45%; d. 08/2019 LV gram: EF 25-35%; e. 10/2019 Echo: EF 25-30%.   PVC's (premature ventricular contractions)    a. 09/2019 Zio (3 days): Avg hr 70, 4 runs NSVT, 5 runs SVT, rare PACs, frequent PVCs w/ 11.8% burden.   Sleep apnea    Stroke [facility])    Stroke/Right temporal lobe infarction [facility])    a. 10/2019 MRI brain: 1cm acute ischemic nonhemorrhagic R temporal lobe infarct. Age-related cerebral atrophy w/ moderate chronic small vessel ischemic dzs.   Type 2 diabetes mellitus with hyperglycemia ([place]) 10/08/2019   Past Surgical History:  Procedure Laterality Date   BACK SURGERY  04/2019   CARDIAC CATHETERIZATION N/A 06/10/2015   Procedure: Left Heart Cath;  Surgeon: Wellington Hampshire, MD;  Location: [place] [facility];  Service: Cardiovascular;  Laterality: N/A;   CORONARY STENT INTERVENTION N/A 10/08/2019   Procedure: CORONARY STENT INTERVENTION;  Surgeon: Wellington Hampshire, MD;  Location: [place] [facility];  Service: Cardiovascular;  Laterality: N/A;   CORONARY STENT PLACEMENT     LEFT HEART CATH AND CORONARY ANGIOGRAPHY N/A 10/08/2019   Procedure: LEFT HEART CATH AND CORONARY ANGIOGRAPHY poss pci;  Surgeon: Wellington Hampshire, MD;  Location: [place] [facility];  Service: Cardiovascular;  Laterality: N/A;   RIGHT HEART CATH AND CORONARY ANGIOGRAPHY N/A 12/30/2021   Procedure: RIGHT HEART CATH AND CORONARY ANGIOGRAPHY;  Surgeon: Nelva Bush, MD;  Location: [place] [facility];  Service: Cardiovascular;  Laterality: N/A;   RIGHT/LEFT HEART CATH AND CORONARY ANGIOGRAPHY N/A 08/20/2019   Procedure: RIGHT/LEFT HEART  CATH AND CORONARY ANGIOGRAPHY;  Surgeon: Wellington Hampshire, MD;  Location: [place] [facility];  Service: Cardiovascular;  Laterality: N/A;   Patient Active Problem List   Diagnosis Date Noted   Acute urinary retention 02/02/2022   Tongue laceration 02/02/2022   Chronic headache 02/02/2022   Ventricular tachycardia (paroxysmal) ([place]) 02/01/2022   Non compliance w medication regimen    History of stroke 01/12/2022   Ventricular tachycardia ([place]) 01/12/2022   Demand ischemia (HCC)    Anaphylactic reaction to wasp sting, assault, initial encounter 01/10/2022   SOB (shortness of breath)    Obesity (BMI 30-39.9) 12/30/2021   Elevated troponin    Chronic HFrEF (heart failure with reduced ejection fraction) ([place]) 12/24/2021   Acute respiratory failure with hypoxia ([place]) 12/24/2021   Chest pain 12/23/2021   Acute CVA (cerebrovascular accident) ([place]) 06/19/2021   Stroke ([place])    NSTEMI (non-ST elevated myocardial infarction) ([place]) 10/08/2019   Type 2 diabetes mellitus with hyperglycemia ([place]) 10/08/2019  Ischemic cardiomyopathy    Unstable angina (HCC) 06/10/2015   CAD (coronary artery disease) 06/10/2015   Acute on chronic systolic (congestive) heart failure (HCC) 06/10/2015   Essential hypertension    Hyperlipidemia    Family History  Problem Relation Age of Onset   Coronary artery disease Mother    Diabetes Mother    Coronary artery disease Father    Social History   Tobacco Use   Smoking status: Former    Types: Cigarettes    Quit date: 06/10/2011    Years since quitting: 10.7   Smokeless tobacco: Never  Substance Use Topics   Alcohol use: No    Alcohol/week: 0.0 standard drinks of alcohol  Still employed as a Chiropractor, 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  No military history.  Lives alone in [place].  Allergies  Allergen Reactions   Contrast Media [Iodinated Contrast Media] Shortness Of Breath   Iohexol Shortness Of Breath     Onset Date: [id number]    Jardiance  [Empagliflozin] Other (See Comments)    Fatigue/weakness   Atorvastatin Other (See Comments)    Myalgias    Hydrocodone Itching   Morphine And Related Other (See Comments)    Lost control    Penicillins Other (See Comments)    Unknown reaction Did it involve swelling of the face/tongue/throat, SOB, or low BP? Unknown Did it involve sudden or severe rash/hives, skin peeling, or any reaction on the inside of your mouth or nose? Unknown Did you need to seek medical attention at a hospital or doctor's office? Yes When did it last happen?      Childhood allergy  If all above answers are "NO", may proceed with cephalosporin use.    Rosuvastatin Other (See Comments)    Myalgias    Current Meds  Medication Sig   amiodarone (PACERONE) 200 MG tablet Take 1 tablet (200 mg total) by mouth 2 (two) times daily.   aspirin EC 81 MG tablet Take 1 tablet (81 mg total) by mouth daily. Swallow whole.   carvedilol (COREG) 3.125 MG tablet Take 1 tablet (3.125 mg total) by mouth 2 (two) times daily with a meal.   empagliflozin (JARDIANCE) 10 MG TABS tablet Take 10 mg by mouth daily.   ezetimibe (ZETIA) 10 MG tablet Take 1 tablet (10 mg total) by mouth daily.   furosemide (LASIX) 40 MG tablet Take 1 tablet (40 mg total) by mouth once daily as needed.   metFORMIN (GLUCOPHAGE) 500 MG tablet Take 500 mg by mouth 2 (two) times daily with a meal.   nitroGLYCERIN (NITROSTAT) 0.4 MG SL tablet Place 1 tablet (0.4 mg total) under the tongue every 5 (five) minutes as needed for chest pain.   sacubitril-valsartan (ENTRESTO) 49-51 MG Take 1 tablet by mouth 2 (two) times daily.   Immunization History  Administered Date(s) Administered   Fluad Quad(high Dose 65+) 06/20/2021       Objective:   Physical Exam BP (!) 152/80 (BP Location: Right Arm, Patient Position: Sitting, Cuff Size: Large)   Pulse (!) 53   Temp 97.7 F (36.5 C) (Oral)   Ht 5\' 6"  (1.676 m)   Wt 211 lb (95.7 kg)   SpO2 94%   BMI 34.06 kg/m   GENERAL: Obese gentleman, no acute distress, fully ambulatory, no conversational dyspnea HEAD: Normocephalic, atraumatic.  EYES: Pupils equal, round, reactive to light.  No scleral icterus.  MOUTH: Mallampati IV, oral mucosa moist, no thrush. NECK: Supple. No thyromegaly. Trachea midline. No JVD.  No adenopathy.  PULMONARY: Distant breath sounds bilaterally.  Coarse, otherwise, no adventitious sounds. CARDIOVASCULAR: S1 and S2.  Bradycardic rate and regular rhythm.  Grade 2/6 systolic ejection murmur left sternal border. ABDOMEN: Obese, otherwise benign. MUSCULOSKELETAL: No joint deformity, no clubbing, no edema.  NEUROLOGIC: No overt focal deficit, no gait disturbance, speech is fluent. SKIN: Intact,warm,dry. PSYCH: Mood and behavior normal.  Ambulatory oximetry was performed today: Resting heart rate was 48 bpm, oxygen saturation at rest on room air was 98%, at maximum exercise patient had a heart rate of 72 bpm, oxygen saturation remained at 96%, nadir was 95%.  Patient ambulated 750 feet.     Assessment & Plan:     ICD-10-CM   1. SOB (shortness of breath)  R06.02 Pulmonary Function Test [facility] Only   Suspect multifactorial Main driver likely cardiac, LVEF 30% May have underlying COPD    2. COPD suggested by initial evaluation [facility])  J44.9 Pulmonary Function Test [facility] Only   Will obtain PFTs No inhalers prescribed as patient has not noted relief from these    3. Chronic HFrEF (heart failure with reduced ejection fraction) (HCC)  I50.22 Polysomnography 4 or more parameters   This issue adds complexity to his management LVEF 30% Significant ischemic cardiomyopathy Suspect main driver of shortness of breath    4. At risk for sleep apnea  Z91.89 Polysomnography 4 or more parameters   Symptoms suggestive of sleep apnea May have COMPLEX apnea given cardiomyopathy Suspect central/obstructive component In-lab sleep study, split-night     Orders Placed This Encounter  Procedures    Pulmonary Function Test [facility] Only    Standing Status:   Future    Standing Expiration Date:   07/15/2022    Order Specific Question:   Full PFT: includes the following: basic spirometry, spirometry pre & post bronchodilator, diffusion capacity (DLCO), lung volumes    Answer:   Full PFT    Order Specific Question:   This test can only be performed at    Answer:   [facility]   Polysomnography 4 or more parameters    Standing Status:   Future    Standing Expiration Date:   03/16/2023    Order Specific Question:   Where should this test be performed:    Answer:   [address]   We will see the patient in follow-up in 6 to 8 weeks time he is to contact us prior to that time should any new difficulties arise.  Gailen Shelter, MD Advanced Bronchoscopy PCCM [facility]    *This note was dictated using voice recognition software/Dragon.  Despite best efforts to proofread, errors can occur which can change the meaning. Any transcriptional errors that result from this process are unintentional and may not be fully corrected at the time of dictation.

## 2022-03-15 NOTE — Patient Instructions (Addendum)
We will get some breathing test this will tell us more about your shortness of breath.  You need a sleep study to evaluate what is happening during your sleep that can be affecting your breathing during the day.  You did very well during your walking test today your oxygen level remained at 94% which was good.  We will see you back in follow-up in 6 to 8 weeks time call sooner should any new problems arise.

## 2022-03-16 NOTE — H&P (View-Only) (Signed)
Electrophysiology Office Note:    Date:  03/17/2022   ID:  Glen Flemings., DOB 10/30/1955, MRN [id number]  PCP:  Patient, No Pcp Per  [facility] Cardiologist:  Lorine Bears, MD  [facility] Electrophysiologist:  Lanier Prude, MD   Referring MD: Yvonne Kendall, MD   Chief Complaint: VT  History of Present Illness:    Glen Bade. is a 66 y.o. male who presents for an evaluation of VT at the request of Dr End. Their medical history includes CAD, MI, prior PCIs, CVA, HFrEF, HTN, HLD, PVC's, DM, tobacco use.  In October 2022, he presented to the ER after being stung by yellow jackets and was found to be in VT. He was treated with IV amiodarone and then discharged on an oral regimen. It isn't clear whether medications were taken after discharge in 2022. He presented to the ER again 02/01/2022 through 02/05/2022 for recurrent VT in the setting of medication noncompliance. He ultimately developed pulseless VT and was shocked. He again was started on amiodarone and discharged. ICD was recommended again and the patient was discharged ultimately with instructions to follow up for ICD implant.  He saw Eula Listen 03/03/2022 in follow up. He reported intermittent episodes of dizziness. He declined LifeVest after discharge. His amiodarone dose was decreased at the appointment given he had previously completed a 10g load.     Past Medical History:  Diagnosis Date   Anginal pain (HCC)    Arthritis    CAD (coronary artery disease)    a. s/p MI with LAD and Diag stenting @ [name];  b. 06/2015 Cath: LAD 63m/d ISR, 100 RCA (ISR) w/ L->R collats, otw mod nonobs dzs-->Med Rx; c. 08/2019 Cath: LM nl, LAD 10p/m ISR, D1 20, D2 100, RI min irregs, LCX 50m/d, OM1 100, OM2 50, RCA 100p, 70d. RPDA fills via collats from LAD. EF 25-35%-->Med Rx; d. 10/2019 NSTEMI/Cath: LCX now 100 (2.75x15 Resolute Onyx DES), otw stable compared to 08/2019.   Chronic combined systolic and diastolic CHF (congestive heart  failure) (HCC)    a. 06/2015 Echo: EF 20-25%, Gr3 DD; b. 10/2019 Echo: EF 25-30%, glob HK, sev inf/infapical HK. Mod dil LA.   Dyspnea    Essential hypertension    Headache 12/23/2021   Hyperlipidemia    Hypokalemia    a. 06/2015 in setting of diuresis.   Ischemic cardiomyopathy    a. 2011 EF 45% ([name]);  b. 06/2015 Echo: EF 20-25%; c. 04/2019 Echo: EF 40-45%; d. 08/2019 LV gram: EF 25-35%; e. 10/2019 Echo: EF 25-30%.   PVC's (premature ventricular contractions)    a. 09/2019 Zio (3 days): Avg hr 70, 4 runs NSVT, 5 runs SVT, rare PACs, frequent PVCs w/ 11.8% burden.   Sleep apnea    Stroke [facility])    Stroke/Right temporal lobe infarction [facility])    a. 10/2019 MRI brain: 1cm acute ischemic nonhemorrhagic R temporal lobe infarct. Age-related cerebral atrophy w/ moderate chronic small vessel ischemic dzs.   Type 2 diabetes mellitus with hyperglycemia (HCC) 10/08/2019    Past Surgical History:  Procedure Laterality Date   BACK SURGERY  04/2019   CARDIAC CATHETERIZATION N/A 06/10/2015   Procedure: Left Heart Cath;  Surgeon: Iran Ouch, MD;  Location: [facility] INVASIVE CV LAB;  Service: Cardiovascular;  Laterality: N/A;   CORONARY STENT INTERVENTION N/A 10/08/2019   Procedure: CORONARY STENT INTERVENTION;  Surgeon: Iran Ouch, MD;  Location: [facility] INVASIVE CV LAB;  Service: Cardiovascular;  Laterality: N/A;  CORONARY STENT PLACEMENT     LEFT HEART CATH AND CORONARY ANGIOGRAPHY N/A 10/08/2019   Procedure: LEFT HEART CATH AND CORONARY ANGIOGRAPHY poss pci;  Surgeon: Iran Ouch, MD;  Location: [facility] INVASIVE CV LAB;  Service: Cardiovascular;  Laterality: N/A;   RIGHT HEART CATH AND CORONARY ANGIOGRAPHY N/A 12/30/2021   Procedure: RIGHT HEART CATH AND CORONARY ANGIOGRAPHY;  Surgeon: Yvonne Kendall, MD;  Location: [facility] INVASIVE CV LAB;  Service: Cardiovascular;  Laterality: N/A;   RIGHT/LEFT HEART CATH AND CORONARY ANGIOGRAPHY N/A 08/20/2019   Procedure: RIGHT/LEFT HEART CATH AND CORONARY  ANGIOGRAPHY;  Surgeon: Iran Ouch, MD;  Location: [facility] INVASIVE CV LAB;  Service: Cardiovascular;  Laterality: N/A;    Current Medications: Current Meds  Medication Sig   amiodarone (PACERONE) 200 MG tablet Take 1 tablet (200 mg total) by mouth 2 (two) times daily.   aspirin EC 81 MG tablet Take 1 tablet (81 mg total) by mouth daily. Swallow whole.   carvedilol (COREG) 3.125 MG tablet Take 1 tablet (3.125 mg total) by mouth 2 (two) times daily with a meal.   empagliflozin (JARDIANCE) 10 MG TABS tablet Take 10 mg by mouth daily.   ezetimibe (ZETIA) 10 MG tablet Take 1 tablet (10 mg total) by mouth daily.   furosemide (LASIX) 40 MG tablet Take 1 tablet (40 mg total) by mouth once daily as needed.   metFORMIN (GLUCOPHAGE) 500 MG tablet Take 500 mg by mouth 2 (two) times daily with a meal.   nitroGLYCERIN (NITROSTAT) 0.4 MG SL tablet Place 1 tablet (0.4 mg total) under the tongue every 5 (five) minutes as needed for chest pain.   sacubitril-valsartan (ENTRESTO) 49-51 MG Take 1 tablet by mouth 2 (two) times daily.     Allergies:   Contrast media [iodinated contrast media], Iohexol, Jardiance [empagliflozin], Atorvastatin, Hydrocodone, Morphine and related, Penicillins, and Rosuvastatin   Social History   Socioeconomic History   Marital status: Widowed    Spouse name: Not on file   Number of children: 2   Years of education: Not on file   Highest education level: GED or equivalent  Occupational History   Occupation: Brick layer   Tobacco Use   Smoking status: Former    Types: Cigarettes    Quit date: 06/10/2011    Years since quitting: 10.7   Smokeless tobacco: Never  Substance and Sexual Activity   Alcohol use: No    Alcohol/week: 0.0 standard drinks of alcohol   Drug use: No   Sexual activity: Not Currently  Other Topics Concern   Not on file  Social History Narrative   Not on file   Social Determinants of Health   Financial Resource Strain: Low Risk  (04/30/2019)    Overall Financial Resource Strain (CARDIA)    Difficulty of Paying Living Expenses: Not hard at all  Food Insecurity: No Food Insecurity (04/30/2019)   Hunger Vital Sign    Worried About Running Out of Food in the Last Year: Never true    Ran Out of Food in the Last Year: Never true  Transportation Needs: No Transportation Needs (04/30/2019)   PRAPARE - Administrator, Civil Service (Medical): No    Lack of Transportation (Non-Medical): No  Physical Activity: Inactive (04/30/2019)   Exercise Vital Sign    Days of Exercise per Week: 0 days    Minutes of Exercise per Session: 0 min  Stress: No Stress Concern Present (04/30/2019)   [employer] of Occupational Health - Occupational Stress  Questionnaire    Feeling of Stress : Not at all  Social Connections: Moderately Isolated (04/30/2019)   Social Connection and Isolation Panel [NHANES]    Frequency of Communication with Friends and Family: More than three times a week    Frequency of Social Gatherings with Friends and Family: More than three times a week    Attends Religious Services: Never    Database administrator or Organizations: Yes    Attends Banker Meetings: Never    Marital Status: Divorced     Family History: The patient's family history includes Coronary artery disease in his father and mother; Diabetes in his mother.  ROS:   Please see the history of present illness.    All other systems reviewed and are negative.  EKGs/Labs/Other Studies Reviewed:    The following studies were reviewed today:  12/30/2021 R/L HC Conclusions: Severe multivessel coronary artery disease, overall relatively similar to most recent cath in 2021.  Mid LCx stent placed at that time demonstrates mild in-stent restenosis in the proximal segment.  Eccentric ostial OM2 lesion appears similar. Mildly elevated left heart and pulmonary artery pressures. Mildly reduced Fick cardiac output/index.   12/23/2021 Echo EF  30 RV normal Mild MR   03/03/2022 ECG shows QRS duration . Sinus rhythm.  Recent Labs: 02/01/2022: ALT 12; B Natriuretic Peptide 245.5 02/05/2022: Hemoglobin 14.7; Platelets 231 03/03/2022: BUN 21; Creatinine, Ser 1.15; Magnesium 2.3; Potassium 4.3; Sodium 140  Recent Lipid Panel    Component Value Date/Time   CHOL 174 02/01/2022 1550   TRIG 230 (H) 02/01/2022 1550   HDL 37 (L) 02/01/2022 1550   CHOLHDL 4.7 02/01/2022 1550   VLDL 46 (H) 02/01/2022 1550   LDLCALC 91 02/01/2022 1550    Physical Exam:    VS:  BP (!) 140/90 (BP Location: Left Arm, Patient Position: Sitting, Cuff Size: Normal)   Pulse (!) 48   Ht 5\' 6"  (1.676 m)   Wt 213 lb 4 oz (96.7 kg)   SpO2 98%   BMI 34.42 kg/m     Wt Readings from Last 3 Encounters:  03/17/22 213 lb 4 oz (96.7 kg)  03/15/22 211 lb (95.7 kg)  03/09/22 213 lb 6 oz (96.8 kg)     GEN:  Well nourished, well developed in no acute distress HEENT: Normal NECK: No JVD; No carotid bruits LYMPHATICS: No lymphadenopathy CARDIAC: RRR, no murmurs, rubs, gallops RESPIRATORY:  Clear to auscultation without rales, wheezing or rhonchi  ABDOMEN: Soft, non-tender, non-distended MUSCULOSKELETAL:  No edema; No deformity  SKIN: Warm and dry NEUROLOGIC:  Alert and oriented x 3 PSYCHIATRIC:  Normal affect       ASSESSMENT:    1. Ventricular tachycardia (HCC)   2. HFrEF (heart failure with reduced ejection fraction) (HCC)    PLAN:    In order of problems listed above:    #VT Recurrent. Symptomatic. In setting of severe CAD. On amiodarone 200mg  PO twice daily.  The patient has an ischemic CM (EF 30%), NYHA Class III CHF, history of sustained monomorphic VT and CAD.  He is referred by Dr End for risk stratification of sudden death and consideration of ICD implantation.  At this time, he meets criteria for ICD implantation for secondary prevention of sudden death.  I have had a thorough discussion with the patient reviewing options.  The  patient and their family (if available) have had opportunities to ask questions and have them answered. The patient and I have decided together through  a shared decision making process to proceed with ICD implant at this time.    Risks, benefits, alternatives to ICD implantation were discussed in detail with the patient today. The patient understands that the risks include but are not limited to bleeding, infection, pneumothorax, perforation, tamponade, vascular damage, renal failure, MI, stroke, death, inappropriate shocks, and lead dislodgement and wishes to proceed.  We will therefore schedule device implantation at the next available time.  Plan for dual-chamber, single coil Medtronic ICD.  #chronic systolic HF 2/2 ICM and severe CAD. NYHA class II-III.    Medication Adjustments/Labs and Tests Ordered: Current medicines are reviewed at length with the patient today.  Concerns regarding medicines are outlined above.  No orders of the defined types were placed in this encounter.  No orders of the defined types were placed in this encounter.    Signed, Rossie Muskrat. Lalla Brothers, MD, [facility], [facility] 03/17/2022 9:40 AM    Electrophysiology [facility]

## 2022-03-16 NOTE — Progress Notes (Unsigned)
Electrophysiology Office Note:    Date:  03/17/2022   ID:  Glen Flemings., DOB 10/30/1955, MRN [id number]  PCP:  Patient, No Pcp Per  [facility] Cardiologist:  Lorine Bears, MD  [facility] Electrophysiologist:  Lanier Prude, MD   Referring MD: Yvonne Kendall, MD   Chief Complaint: VT  History of Present Illness:    Glen Bade. is a 66 y.o. male who presents for an evaluation of VT at the request of Dr End. Their medical history includes CAD, MI, prior PCIs, CVA, HFrEF, HTN, HLD, PVC's, DM, tobacco use.  In October 2022, he presented to the ER after being stung by yellow jackets and was found to be in VT. He was treated with IV amiodarone and then discharged on an oral regimen. It isn't clear whether medications were taken after discharge in 2022. He presented to the ER again 02/01/2022 through 02/05/2022 for recurrent VT in the setting of medication noncompliance. He ultimately developed pulseless VT and was shocked. He again was started on amiodarone and discharged. ICD was recommended again and the patient was discharged ultimately with instructions to follow up for ICD implant.  He saw Eula Listen 03/03/2022 in follow up. He reported intermittent episodes of dizziness. He declined LifeVest after discharge. His amiodarone dose was decreased at the appointment given he had previously completed a 10g load.     Past Medical History:  Diagnosis Date   Anginal pain (HCC)    Arthritis    CAD (coronary artery disease)    a. s/p MI with LAD and Diag stenting @ [name];  b. 06/2015 Cath: LAD 63m/d ISR, 100 RCA (ISR) w/ L->R collats, otw mod nonobs dzs-->Med Rx; c. 08/2019 Cath: LM nl, LAD 10p/m ISR, D1 20, D2 100, RI min irregs, LCX 50m/d, OM1 100, OM2 50, RCA 100p, 70d. RPDA fills via collats from LAD. EF 25-35%-->Med Rx; d. 10/2019 NSTEMI/Cath: LCX now 100 (2.75x15 Resolute Onyx DES), otw stable compared to 08/2019.   Chronic combined systolic and diastolic CHF (congestive heart  failure) (HCC)    a. 06/2015 Echo: EF 20-25%, Gr3 DD; b. 10/2019 Echo: EF 25-30%, glob HK, sev inf/infapical HK. Mod dil LA.   Dyspnea    Essential hypertension    Headache 12/23/2021   Hyperlipidemia    Hypokalemia    a. 06/2015 in setting of diuresis.   Ischemic cardiomyopathy    a. 2011 EF 45% ([name]);  b. 06/2015 Echo: EF 20-25%; c. 04/2019 Echo: EF 40-45%; d. 08/2019 LV gram: EF 25-35%; e. 10/2019 Echo: EF 25-30%.   PVC's (premature ventricular contractions)    a. 09/2019 Zio (3 days): Avg hr 70, 4 runs NSVT, 5 runs SVT, rare PACs, frequent PVCs w/ 11.8% burden.   Sleep apnea    Stroke [facility])    Stroke/Right temporal lobe infarction [facility])    a. 10/2019 MRI brain: 1cm acute ischemic nonhemorrhagic R temporal lobe infarct. Age-related cerebral atrophy w/ moderate chronic small vessel ischemic dzs.   Type 2 diabetes mellitus with hyperglycemia (HCC) 10/08/2019    Past Surgical History:  Procedure Laterality Date   BACK SURGERY  04/2019   CARDIAC CATHETERIZATION N/A 06/10/2015   Procedure: Left Heart Cath;  Surgeon: Iran Ouch, MD;  Location: [facility] INVASIVE CV LAB;  Service: Cardiovascular;  Laterality: N/A;   CORONARY STENT INTERVENTION N/A 10/08/2019   Procedure: CORONARY STENT INTERVENTION;  Surgeon: Iran Ouch, MD;  Location: [facility] INVASIVE CV LAB;  Service: Cardiovascular;  Laterality: N/A;  CORONARY STENT PLACEMENT     LEFT HEART CATH AND CORONARY ANGIOGRAPHY N/A 10/08/2019   Procedure: LEFT HEART CATH AND CORONARY ANGIOGRAPHY poss pci;  Surgeon: Iran Ouch, MD;  Location: [facility] INVASIVE CV LAB;  Service: Cardiovascular;  Laterality: N/A;   RIGHT HEART CATH AND CORONARY ANGIOGRAPHY N/A 12/30/2021   Procedure: RIGHT HEART CATH AND CORONARY ANGIOGRAPHY;  Surgeon: Yvonne Kendall, MD;  Location: [facility] INVASIVE CV LAB;  Service: Cardiovascular;  Laterality: N/A;   RIGHT/LEFT HEART CATH AND CORONARY ANGIOGRAPHY N/A 08/20/2019   Procedure: RIGHT/LEFT HEART CATH AND CORONARY  ANGIOGRAPHY;  Surgeon: Iran Ouch, MD;  Location: [facility] INVASIVE CV LAB;  Service: Cardiovascular;  Laterality: N/A;    Current Medications: Current Meds  Medication Sig   amiodarone (PACERONE) 200 MG tablet Take 1 tablet (200 mg total) by mouth 2 (two) times daily.   aspirin EC 81 MG tablet Take 1 tablet (81 mg total) by mouth daily. Swallow whole.   carvedilol (COREG) 3.125 MG tablet Take 1 tablet (3.125 mg total) by mouth 2 (two) times daily with a meal.   empagliflozin (JARDIANCE) 10 MG TABS tablet Take 10 mg by mouth daily.   ezetimibe (ZETIA) 10 MG tablet Take 1 tablet (10 mg total) by mouth daily.   furosemide (LASIX) 40 MG tablet Take 1 tablet (40 mg total) by mouth once daily as needed.   metFORMIN (GLUCOPHAGE) 500 MG tablet Take 500 mg by mouth 2 (two) times daily with a meal.   nitroGLYCERIN (NITROSTAT) 0.4 MG SL tablet Place 1 tablet (0.4 mg total) under the tongue every 5 (five) minutes as needed for chest pain.   sacubitril-valsartan (ENTRESTO) 49-51 MG Take 1 tablet by mouth 2 (two) times daily.     Allergies:   Contrast media [iodinated contrast media], Iohexol, Jardiance [empagliflozin], Atorvastatin, Hydrocodone, Morphine and related, Penicillins, and Rosuvastatin   Social History   Socioeconomic History   Marital status: Widowed    Spouse name: Not on file   Number of children: 2   Years of education: Not on file   Highest education level: GED or equivalent  Occupational History   Occupation: Brick layer   Tobacco Use   Smoking status: Former    Types: Cigarettes    Quit date: 06/10/2011    Years since quitting: 10.7   Smokeless tobacco: Never  Substance and Sexual Activity   Alcohol use: No    Alcohol/week: 0.0 standard drinks of alcohol   Drug use: No   Sexual activity: Not Currently  Other Topics Concern   Not on file  Social History Narrative   Not on file   Social Determinants of Health   Financial Resource Strain: Low Risk  (04/30/2019)    Overall Financial Resource Strain (CARDIA)    Difficulty of Paying Living Expenses: Not hard at all  Food Insecurity: No Food Insecurity (04/30/2019)   Hunger Vital Sign    Worried About Running Out of Food in the Last Year: Never true    Ran Out of Food in the Last Year: Never true  Transportation Needs: No Transportation Needs (04/30/2019)   PRAPARE - Administrator, Civil Service (Medical): No    Lack of Transportation (Non-Medical): No  Physical Activity: Inactive (04/30/2019)   Exercise Vital Sign    Days of Exercise per Week: 0 days    Minutes of Exercise per Session: 0 min  Stress: No Stress Concern Present (04/30/2019)   [employer] of Occupational Health - Occupational Stress  Questionnaire    Feeling of Stress : Not at all  Social Connections: Moderately Isolated (04/30/2019)   Social Connection and Isolation Panel [NHANES]    Frequency of Communication with Friends and Family: More than three times a week    Frequency of Social Gatherings with Friends and Family: More than three times a week    Attends Religious Services: Never    Database administrator or Organizations: Yes    Attends Banker Meetings: Never    Marital Status: Divorced     Family History: The patient's family history includes Coronary artery disease in his father and mother; Diabetes in his mother.  ROS:   Please see the history of present illness.    All other systems reviewed and are negative.  EKGs/Labs/Other Studies Reviewed:    The following studies were reviewed today:  12/30/2021 R/L HC Conclusions: Severe multivessel coronary artery disease, overall relatively similar to most recent cath in 2021.  Mid LCx stent placed at that time demonstrates mild in-stent restenosis in the proximal segment.  Eccentric ostial OM2 lesion appears similar. Mildly elevated left heart and pulmonary artery pressures. Mildly reduced Fick cardiac output/index.   12/23/2021 Echo EF  30 RV normal Mild MR   03/03/2022 ECG shows QRS duration . Sinus rhythm.  Recent Labs: 02/01/2022: ALT 12; B Natriuretic Peptide 245.5 02/05/2022: Hemoglobin 14.7; Platelets 231 03/03/2022: BUN 21; Creatinine, Ser 1.15; Magnesium 2.3; Potassium 4.3; Sodium 140  Recent Lipid Panel    Component Value Date/Time   CHOL 174 02/01/2022 1550   TRIG 230 (H) 02/01/2022 1550   HDL 37 (L) 02/01/2022 1550   CHOLHDL 4.7 02/01/2022 1550   VLDL 46 (H) 02/01/2022 1550   LDLCALC 91 02/01/2022 1550    Physical Exam:    VS:  BP (!) 140/90 (BP Location: Left Arm, Patient Position: Sitting, Cuff Size: Normal)   Pulse (!) 48   Ht 5\' 6"  (1.676 m)   Wt 213 lb 4 oz (96.7 kg)   SpO2 98%   BMI 34.42 kg/m     Wt Readings from Last 3 Encounters:  03/17/22 213 lb 4 oz (96.7 kg)  03/15/22 211 lb (95.7 kg)  03/09/22 213 lb 6 oz (96.8 kg)     GEN:  Well nourished, well developed in no acute distress HEENT: Normal NECK: No JVD; No carotid bruits LYMPHATICS: No lymphadenopathy CARDIAC: RRR, no murmurs, rubs, gallops RESPIRATORY:  Clear to auscultation without rales, wheezing or rhonchi  ABDOMEN: Soft, non-tender, non-distended MUSCULOSKELETAL:  No edema; No deformity  SKIN: Warm and dry NEUROLOGIC:  Alert and oriented x 3 PSYCHIATRIC:  Normal affect       ASSESSMENT:    1. Ventricular tachycardia (HCC)   2. HFrEF (heart failure with reduced ejection fraction) (HCC)    PLAN:    In order of problems listed above:    #VT Recurrent. Symptomatic. In setting of severe CAD. On amiodarone 200mg  PO twice daily.  The patient has an ischemic CM (EF 30%), NYHA Class III CHF, history of sustained monomorphic VT and CAD.  He is referred by Dr End for risk stratification of sudden death and consideration of ICD implantation.  At this time, he meets criteria for ICD implantation for secondary prevention of sudden death.  I have had a thorough discussion with the patient reviewing options.  The  patient and their family (if available) have had opportunities to ask questions and have them answered. The patient and I have decided together through  a shared decision making process to proceed with ICD implant at this time.    Risks, benefits, alternatives to ICD implantation were discussed in detail with the patient today. The patient understands that the risks include but are not limited to bleeding, infection, pneumothorax, perforation, tamponade, vascular damage, renal failure, MI, stroke, death, inappropriate shocks, and lead dislodgement and wishes to proceed.  We will therefore schedule device implantation at the next available time.  Plan for dual-chamber, single coil Medtronic ICD.  #chronic systolic HF 2/2 ICM and severe CAD. NYHA class II-III.    Medication Adjustments/Labs and Tests Ordered: Current medicines are reviewed at length with the patient today.  Concerns regarding medicines are outlined above.  No orders of the defined types were placed in this encounter.  No orders of the defined types were placed in this encounter.    Signed, Rossie Muskrat. Lalla Brothers, MD, [facility], [facility] 03/17/2022 9:40 AM    Electrophysiology [facility]

## 2022-03-17 ENCOUNTER — Ambulatory Visit: Payer: Medicaid Other | Attending: Cardiology | Admitting: Cardiology

## 2022-03-17 ENCOUNTER — Encounter: Payer: Self-pay | Admitting: *Deleted

## 2022-03-17 ENCOUNTER — Other Ambulatory Visit
Admission: RE | Admit: 2022-03-17 | Discharge: 2022-03-17 | Disposition: A | Discharge status: Home / Self Care | Payer: Medicaid Other | Source: Ambulatory Visit | Attending: Cardiology | Admitting: Cardiology

## 2022-03-17 VITALS — BP 140/90 | HR 48 | Ht 66.0 in | Wt 213.2 lb

## 2022-03-17 DIAGNOSIS — I472 Ventricular tachycardia, unspecified: Secondary | ICD-10-CM | POA: Insufficient documentation

## 2022-03-17 DIAGNOSIS — Z01818 Encounter for other preprocedural examination: Secondary | ICD-10-CM | POA: Insufficient documentation

## 2022-03-17 DIAGNOSIS — I5020 Unspecified systolic (congestive) heart failure: Secondary | ICD-10-CM | POA: Insufficient documentation

## 2022-03-17 LAB — CBC WITH DIFFERENTIAL/PLATELET
Abs Immature Granulocytes: 0.06 10*3/uL (ref 0.00–0.07)
Basophils Absolute: 0.1 10*3/uL (ref 0.0–0.1)
Basophils Relative: 1 %
Eosinophils Absolute: 0.2 10*3/uL (ref 0.0–0.5)
Eosinophils Relative: 2 %
HCT: 47.2 % (ref 39.0–52.0)
Hemoglobin: 16.3 g/dL (ref 13.0–17.0)
Immature Granulocytes: 1 %
Lymphocytes Relative: 20 %
Lymphs Abs: 1.7 10*3/uL (ref 0.7–4.0)
MCH: 30.3 pg (ref 26.0–34.0)
MCHC: 34.5 g/dL (ref 30.0–36.0)
MCV: 87.7 fL (ref 80.0–100.0)
Monocytes Absolute: 0.8 10*3/uL (ref 0.1–1.0)
Monocytes Relative: 9 %
Neutro Abs: 5.8 10*3/uL (ref 1.7–7.7)
Neutrophils Relative %: 67 %
Platelets: 259 10*3/uL (ref 150–400)
RBC: 5.38 MIL/uL (ref 4.22–5.81)
RDW: 14.8 % (ref 11.5–15.5)
WBC: 8.6 10*3/uL (ref 4.0–10.5)
nRBC: 0 % (ref 0.0–0.2)

## 2022-03-17 LAB — BASIC METABOLIC PANEL
Anion gap: 8 (ref 5–15)
BUN: 10 mg/dL (ref 8–23)
CO2: 23 mmol/L (ref 22–32)
Calcium: 8.8 mg/dL — ABNORMAL LOW (ref 8.9–10.3)
Chloride: 109 mmol/L (ref 98–111)
Creatinine, Ser: 0.93 mg/dL (ref 0.61–1.24)
GFR, Estimated: >60 mL/min (ref >60)
Glucose, Bld: 157 mg/dL — ABNORMAL HIGH (ref 70–99)
Potassium: 3.9 mmol/L (ref 3.5–5.1)
Sodium: 140 mmol/L (ref 135–145)

## 2022-03-17 NOTE — Patient Instructions (Signed)
Medication Instructions:  none *If you need a refill on your cardiac medications before your next appointment, please call your pharmacy*   Lab Work: Today CBC, BMP  Medical Mall Entrance at [facility] 1st desk on the right to check in Lab hours: 7:30 am- 5:30 pm (walk in basis)  If you have labs (blood work) drawn today and your tests are completely normal, you will receive your results only by: MyChart Message (if you have MyChart) OR A paper copy in the mail If you have any lab test that is abnormal or we need to change your treatment, we will call you to review the results.   Testing/Procedures: Your physician has recommended that you have a defibrillator inserted. An implantable cardioverter defibrillator (ICD) is a small device that is placed in your chest or, in rare cases, your abdomen. This device uses electrical pulses or shocks to help control life-threatening, irregular heartbeats that could lead the heart to suddenly stop beating (sudden cardiac arrest). Leads are attached to the ICD that goes into your heart. This is done in the hospital and usually requires an overnight stay. Please see the instruction sheet given to you today for more information.   Follow-Up: At [facility], you and your health needs are our priority.  As part of our continuing mission to provide you with exceptional heart care, we have created designated Provider Care Teams.  These Care Teams include your primary Cardiologist (physician) and Advanced Practice Providers (APPs -  Physician Assistants and Nurse Practitioners) who all work together to provide you with the care you need, when you need it.  We recommend signing up for the patient portal called "MyChart".  Sign up information is provided on this After Visit Summary.  MyChart is used to connect with patients for Virtual Visits (Telemedicine).  Patients are able to view lab/test results, encounter notes, upcoming appointments, etc.  Non-urgent  messages can be sent to your provider as well.   To learn more about what you can do with MyChart, go to [email or web address].    Your next appointment:   See instruction letter.     Important Information About Sugar

## 2022-03-31 ENCOUNTER — Other Ambulatory Visit: Payer: Self-pay

## 2022-03-31 MED ORDER — TRAMADOL HCL 50 MG PO TABS
50.0000 mg | ORAL_TABLET | Freq: Three times a day (TID) | ORAL | 0 refills | Status: DC | PRN
  Filled 2022-03-31: qty 30, 10d supply, fill #0

## 2022-04-07 ENCOUNTER — Ambulatory Visit
Admission: RE | Admit: 2022-04-07 | Discharge: 2022-04-07 | Disposition: A | Discharge status: Home / Self Care | Payer: Medicaid Other | Attending: Cardiology | Admitting: Cardiology

## 2022-04-07 ENCOUNTER — Encounter: Payer: Self-pay | Admitting: Cardiology

## 2022-04-07 ENCOUNTER — Ambulatory Visit: Payer: Medicaid Other

## 2022-04-07 ENCOUNTER — Other Ambulatory Visit: Payer: Self-pay

## 2022-04-07 ENCOUNTER — Encounter
Admission: RE | Disposition: A | Discharge status: Home / Self Care | Payer: Medicaid Other | Source: Home / Self Care | Attending: Cardiology

## 2022-04-07 DIAGNOSIS — I255 Ischemic cardiomyopathy: Secondary | ICD-10-CM | POA: Diagnosis present

## 2022-04-07 DIAGNOSIS — E119 Type 2 diabetes mellitus without complications: Secondary | ICD-10-CM | POA: Insufficient documentation

## 2022-04-07 DIAGNOSIS — I429 Cardiomyopathy, unspecified: Secondary | ICD-10-CM

## 2022-04-07 DIAGNOSIS — I5022 Chronic systolic (congestive) heart failure: Secondary | ICD-10-CM | POA: Diagnosis present

## 2022-04-07 DIAGNOSIS — I472 Ventricular tachycardia, unspecified: Secondary | ICD-10-CM | POA: Diagnosis present

## 2022-04-07 DIAGNOSIS — I2510 Atherosclerotic heart disease of native coronary artery without angina pectoris: Secondary | ICD-10-CM | POA: Diagnosis present

## 2022-04-07 DIAGNOSIS — E781 Pure hyperglyceridemia: Secondary | ICD-10-CM | POA: Diagnosis not present

## 2022-04-07 DIAGNOSIS — Z79899 Other long term (current) drug therapy: Secondary | ICD-10-CM | POA: Diagnosis not present

## 2022-04-07 DIAGNOSIS — Z8673 Personal history of transient ischemic attack (TIA), and cerebral infarction without residual deficits: Secondary | ICD-10-CM | POA: Diagnosis not present

## 2022-04-07 DIAGNOSIS — Z91148 Patient's other noncompliance with medication regimen for other reason: Secondary | ICD-10-CM | POA: Insufficient documentation

## 2022-04-07 DIAGNOSIS — I4729 Other ventricular tachycardia: Secondary | ICD-10-CM

## 2022-04-07 DIAGNOSIS — I252 Old myocardial infarction: Secondary | ICD-10-CM | POA: Diagnosis not present

## 2022-04-07 DIAGNOSIS — I5042 Chronic combined systolic (congestive) and diastolic (congestive) heart failure: Secondary | ICD-10-CM | POA: Diagnosis not present

## 2022-04-07 DIAGNOSIS — I110 Hypertensive heart disease with heart failure: Secondary | ICD-10-CM | POA: Diagnosis not present

## 2022-04-07 HISTORY — PX: ICD IMPLANT: EP1208

## 2022-04-07 LAB — GLUCOSE, CAPILLARY: Glucose-Capillary: 209 mg/dL — ABNORMAL HIGH (ref 70–99)

## 2022-04-07 SURGERY — ICD IMPLANT
Anesthesia: Moderate Sedation

## 2022-04-07 MED ORDER — VANCOMYCIN HCL IN DEXTROSE 1-5 GM/200ML-% IV SOLN
1000.0000 mg | INTRAVENOUS | Status: AC
  Administered 2022-04-07: 1000 mg via INTRAVENOUS
  Filled 2022-04-07: qty 200

## 2022-04-07 MED ORDER — ONDANSETRON HCL 4 MG/2ML IJ SOLN: 4.0000 mg | Freq: Four times a day (QID) | INTRAMUSCULAR | Status: DC | PRN

## 2022-04-07 MED ORDER — CHLORHEXIDINE GLUCONATE 4 % EX LIQD: 4.0000 | Freq: Once | CUTANEOUS | Status: DC

## 2022-04-07 MED ORDER — DIPHENHYDRAMINE HCL 50 MG/ML IJ SOLN
INTRAMUSCULAR | Status: AC
  Administered 2022-04-07: 25 mg
  Filled 2022-04-07: qty 1

## 2022-04-07 MED ORDER — ACETAMINOPHEN 325 MG PO TABS
ORAL_TABLET | ORAL | Status: AC
  Filled 2022-04-07: qty 2

## 2022-04-07 MED ORDER — LIDOCAINE HCL (PF) 1 % IJ SOLN
INTRAMUSCULAR | Status: DC | PRN
  Administered 2022-04-07: 30 mL

## 2022-04-07 MED ORDER — AMIODARONE HCL 200 MG PO TABS: 200.0000 mg | ORAL_TABLET | Freq: Two times a day (BID) | ORAL | Status: DC

## 2022-04-07 MED ORDER — FENTANYL CITRATE (PF) 100 MCG/2ML IJ SOLN
INTRAMUSCULAR | Status: AC
  Filled 2022-04-07: qty 2

## 2022-04-07 MED ORDER — MIDAZOLAM HCL 2 MG/2ML IJ SOLN
INTRAMUSCULAR | Status: AC
  Filled 2022-04-07: qty 2

## 2022-04-07 MED ORDER — ACETAMINOPHEN 325 MG PO TABS
325.0000 mg | ORAL_TABLET | ORAL | Status: DC | PRN
  Administered 2022-04-07: 650 mg via ORAL

## 2022-04-07 MED ORDER — SACUBITRIL-VALSARTAN 49-51 MG PO TABS: 1.0000 | ORAL_TABLET | Freq: Two times a day (BID) | ORAL | Status: DC

## 2022-04-07 MED ORDER — DIPHENHYDRAMINE HCL 50 MG/ML IJ SOLN: 25.0000 mg | Freq: Once | INTRAMUSCULAR | Status: AC

## 2022-04-07 MED ORDER — MIDAZOLAM HCL 2 MG/2ML IJ SOLN
INTRAMUSCULAR | Status: DC | PRN
  Administered 2022-04-07 (×3): 1 mg via INTRAVENOUS

## 2022-04-07 MED ORDER — SODIUM CHLORIDE 0.9 % IV SOLN
80.0000 mg | INTRAVENOUS | Status: AC
  Administered 2022-04-07: 80 mg
  Filled 2022-04-07: qty 2

## 2022-04-07 MED ORDER — HYDRALAZINE HCL 20 MG/ML IJ SOLN
INTRAMUSCULAR | Status: AC
  Filled 2022-04-07: qty 1

## 2022-04-07 MED ORDER — LIDOCAINE HCL 1 % IJ SOLN
INTRAMUSCULAR | Status: AC
  Filled 2022-04-07: qty 20

## 2022-04-07 MED ORDER — POVIDONE-IODINE 10 % EX SWAB: 2.0000 | Freq: Once | CUTANEOUS | Status: DC

## 2022-04-07 MED ORDER — SODIUM CHLORIDE 0.9 % IV SOLN: INTRAVENOUS | Status: DC

## 2022-04-07 MED ORDER — CARVEDILOL 3.125 MG PO TABS: 3.1250 mg | ORAL_TABLET | Freq: Two times a day (BID) | ORAL | Status: DC

## 2022-04-07 MED ORDER — HYDRALAZINE HCL 20 MG/ML IJ SOLN
INTRAMUSCULAR | Status: DC | PRN
  Administered 2022-04-07: 5 mg via INTRAVENOUS
  Administered 2022-04-07: 10 mg via INTRAVENOUS

## 2022-04-07 MED ORDER — TRAMADOL HCL 50 MG PO TABS: 50.0000 mg | ORAL_TABLET | Freq: Three times a day (TID) | ORAL | Status: DC | PRN

## 2022-04-07 MED ORDER — CHLORHEXIDINE GLUCONATE CLOTH 2 % EX PADS
6.0000 | MEDICATED_PAD | Freq: Every day | CUTANEOUS | Status: DC
  Administered 2022-04-07: 6 via TOPICAL

## 2022-04-07 MED ORDER — HEPARIN (PORCINE) IN NACL 1000-0.9 UT/500ML-% IV SOLN
INTRAVENOUS | Status: DC | PRN
  Administered 2022-04-07: 500 mL

## 2022-04-07 MED ORDER — LIDOCAINE HCL 1 % IJ SOLN
INTRAMUSCULAR | Status: AC
  Filled 2022-04-07: qty 40

## 2022-04-07 MED ORDER — FENTANYL CITRATE (PF) 100 MCG/2ML IJ SOLN
INTRAMUSCULAR | Status: DC | PRN
  Administered 2022-04-07 (×4): 25 ug via INTRAVENOUS

## 2022-04-07 SURGICAL SUPPLY — 20 items
CABLE SURG 12 DISP A/V CHANNEL (MISCELLANEOUS) ×1 IMPLANT
COVER SURGICAL LIGHT HANDLE (MISCELLANEOUS) IMPLANT
DEVICE DSSCT PLSMBLD 3.0S LGHT (MISCELLANEOUS) IMPLANT
ICD EVERA DR XT MRI DDMB1D4 (ICD Generator) IMPLANT
INTRO PACEMAKR LEAD 9FR 13CM (INTRODUCER) ×1
INTRO PACEMKR SHEATH II 7FR (MISCELLANEOUS) ×1
INTRODUCER PACEMKR LD 9FR 13CM (INTRODUCER) IMPLANT
INTRODUCER PACEMKR SHTH II 7FR (MISCELLANEOUS) IMPLANT
KIT SYRINGE INJ CVI SPIKEX1 (MISCELLANEOUS) IMPLANT
LEAD CAPSURE NOVUS 5076-52CM (Lead) IMPLANT
LEAD SPRINT QUAT SEC 6935M-62 (Lead) IMPLANT
PAD ELECT DEFIB RADIOL ZOLL (MISCELLANEOUS) ×1 IMPLANT
PLASMABLADE 3.0S W/LIGHT (MISCELLANEOUS) ×1
SLING ARM IMMOBILIZER LRG (SOFTGOODS) IMPLANT
SUT ETHIBOND CT1 BRD #0 30IN (SUTURE) IMPLANT
SUT VIC AB 2-0 CT1 27 (SUTURE) ×1
SUT VIC AB 2-0 CT1 TAPERPNT 27 (SUTURE) IMPLANT
SUT VIC AB 3-0 CT1 27 (SUTURE) ×1
SUT VIC AB 3-0 CT1 TAPERPNT 27 (SUTURE) IMPLANT
TRAY PACEMAKER INSERTION (PACKS) ×1 IMPLANT

## 2022-04-07 NOTE — Discharge Instructions (Addendum)
After Your ICD (Implantable Cardiac Defibrillator)   You have a Medtronic ICD  ACTIVITY Do not lift your arm above shoulder height for 1 week after your procedure. After 7 days, you may progress as below.  You should remove your sling 24 hours after your procedure, unless otherwise instructed by your provider.     Wednesday April 14, 2022  Thursday April 15, 2022 Friday April 16, 2022 Saturday April 17, 2022   Do not lift, push, pull, or carry anything over 10 pounds with the affected arm until 6 weeks (Wednesday May 19, 2022 ) after your procedure.   You may drive AFTER your wound check, unless you have been told otherwise by your provider.   Ask your healthcare provider when you can go back to work   INCISION/Dressing If you are on a blood thinner such as Coumadin, Xarelto, Eliquis, Plavix, or Pradaxa please confirm with your provider when this should be resumed. This does not apply to patient since he is not on a blood thinner.   If large square, outer bandage is left in place, this can be removed after 24 hours from your procedure. Do not remove steri-strips or glue as below.   Monitor your defibrillator site for redness, swelling, and drainage. Call the device clinic at [phone] if you experience these symptoms or fever/chills.  If your incision is closed with Dermabond/Surgical glue. You may shower 1 day after your pacemaker implant and wash around the site with soap and water.    If you were discharged in a sling, please do not wear this during the day more than 48 hours after your surgery unless otherwise instructed. This may increase the risk of stiffness and soreness in your shoulder.   Avoid lotions, ointments, or perfumes over your incision until it is well-healed.  You may use a hot tub or a pool AFTER your wound check appointment if the incision is completely closed.  Your ICD is designed to protect you from life threatening heart rhythms. Because of  this, you may receive a shock.   1 shock with no symptoms:  Call the office during business hours. 1 shock with symptoms (chest pain, chest pressure, dizziness, lightheadedness, shortness of breath, overall feeling unwell):  Call 911. If you experience 2 or more shocks in 24 hours:  Call 911. If you receive a shock, you should not drive for 6 months per the [place] DMV IF you receive appropriate therapy from your ICD.   ICD Alerts:  Some alerts are vibratory and others beep. These are NOT emergencies. Please call our office to let us know. If this occurs at night or on weekends, it can wait until the next business day. Send a remote transmission.  If your device is capable of reading fluid status (for heart failure), you will be offered monthly monitoring to review this with you.   DEVICE MANAGEMENT Remote monitoring is used to monitor your ICD from home. This monitoring is scheduled every 91 days by our office. It allows [place] to keep an eye on the functioning of your device to ensure it is working properly. You will routinely see your Electrophysiologist annually (more often if necessary).   You should receive your ID card for your new device in 4-8 weeks. Keep this card with you at all times once received. Consider wearing a medical alert bracelet or necklace.  Your ICD  may be MRI compatible. This will be discussed at your next office visit/wound check.  You should avoid  contact with strong electric or magnetic fields.   Do not use amateur (ham) radio equipment or electric (arc) welding torches. MP3 player headphones with magnets should not be used. Some devices are safe to use if held at least 12 inches (30 cm) from your defibrillator. These include power tools, lawn mowers, and speakers. If you are unsure if something is safe to use, ask your health care provider.  When using your cell phone, hold it to the ear that is on the opposite side from the defibrillator. Do not leave your cell phone in a  pocket over the defibrillator.  You may safely use electric blankets, heating pads, computers, and microwave ovens.  Call the office right away if: You have chest pain. You feel more than one shock. You feel more short of breath than you have felt before. You feel more light-headed than you have felt before. Your incision starts to open up.  This information is not intended to replace advice given to you by your health care provider. Make sure you discuss any questions you have with your health care provider.

## 2022-04-07 NOTE — Progress Notes (Signed)
This RN was pushing pt out to medical mall area when pt began to move left arm sling - noting that sling was not being worn correctly this RN offered to adjust the sling for the comfort of pt.  Pt then left wheelchair and and began walking towards medical mall expressing displeasure at the sling, and care that had been received thus far.  This RN found this surprising as previous interactions with the pt were positive.  Pt appeared angry and wasn't inclined to give any details as to reason for displeasure.  This RN escorted pt to front of medical mall with wheelchair and when this RN asked the pt what kind of car the pt's son was driving (as it was apparent the son was not here to pick-up the pt) the pt said : "you ain't gotta wait here like some damn prison guard. Goddamn..." pt had then turned away to walk to the other side of the pillar and there were unintelligible words after this.  This RN then called charge to notify of events and was advised to document these events.  Pt then got into a black Chrysler 300 driven by another and left.

## 2022-04-07 NOTE — Interval H&P Note (Signed)
History and Physical Interval Note:  04/07/2022 6:44 AM  [name].  has presented today for surgery, with the diagnosis of ICD Implant    Medtronic   Cardiomyopathy.  The various methods of treatment have been discussed with the patient and family. After consideration of risks, benefits and other options for treatment, the patient has consented to  Procedure(s): ICD IMPLANT (N/A) as a surgical intervention.  The patient's history has been reviewed, patient examined, no change in status, stable for surgery.  I have reviewed the patient's chart and labs.  Questions were answered to the patient's satisfaction.     Sydelle Sherfield T Joeziah Voit

## 2022-04-07 NOTE — Discharge Summary (Signed)
Discharge Summary    Patient ID: [name]. MRN: [id number]; DOB: 28-Oct-1955  Admit date: 04/07/2022 Discharge date: 04/07/2022  PCP:  Patient, No Pcp Per   [facility] Cardiologist:  Kathlyn Sacramento, MD  Electrophysiologist:  Vickie Epley, MD  {   Discharge Diagnoses    Principal Problem:   Ventricular tachycardia [facility]) Active Problems:   CAD (coronary artery disease)   Ischemic cardiomyopathy   Chronic HFrEF (heart failure with reduced ejection fraction) ([place])   Diagnostic Studies/Procedures    ICD implantation 04/07/22 Cumulative Air Kerma: 174 (mGy) Complications  No complications were associated with this study.  Documented by Orvan Seen, RN - 04/07/2022  8:40 AM     Implants     Lead  Lead Sprint Quat Sec 6981m-62 - [id number] - Implanted  Inventory item: LEAD Christean Grief [facility] 3846K-59 Model/Cat number: [id number]  Serial number: [id number] Manufacturer: [employer]  Device identifier: [id number] Device identifier type: GS1  Area Of Implantation: Right Ventricle    GUDID Information  Request status Successful    Brand name: Sprint Quattro Secure S MRIT SureScanT Version/Model: [place] name: [employer] MRI safety info as of 04/07/22: MR Conditional  Contains dry or latex rubber: No    GMDN P.T. name: Endocardial defibrillation lead     As of 04/07/2022  Status: Implanted      Lead Capsure Novus 5076-52cm - [id number] - Implanted  Inventory item: LEAD CAPSURE NOVUS 3428-76OT Model/Cat number: [id number]  Serial number: [id number] Manufacturer: [employer]  Device identifier: [id number] Device identifier type: GS1  Area Of Implantation: Right Atrium    GUDID Information  Request status Successful    Brand name: CapSureFix Novus MRIT SureScan Version/Model: [place] name: [employer] MRI safety info as of 04/07/22: MR Conditional  Contains dry or latex  rubber: No    GMDN P.T. name: Endocardial pacing lead     As of 04/07/2022  Status: Implanted      ICD Generator  Icd Evera Dr Genene Churn Ddmb1d4 - [id number] - Implanted  Inventory item: ICD EVERA DR XT MRI [id number] Model/Cat number: [id number]  Serial number: [id number] Manufacturer: [employer]  Device identifier: [id number] Device identifier type: GS1  Area Of Implantation: Right Atrium    GUDID Information  Request status Successful    Brand name: Evera MRIT XT DR SureScan Version/Model: PVVZ4M2  Company name: [employer]. MRI safety info as of 04/07/22: MR Conditional  Contains dry or latex rubber: No    GMDN P.T. name: Dual-chamber implantable defibrillator     As of 04/07/2022  Status: Implanted     _____________   History of Present Illness     [name]. is a 66 y.o. male with a h/o CAD with prior PCIs, CVA, HFrEF, HTN, HLD, PVCs, DM, tobacco use who was referred to EP for VT.   The patient was admitted in October 2022 found to be in VT treated with IV amiodarone and then discharged on oral regimen. He presented to the ER gain 02/01/22- 02/05/22 for recurrent VT in the setting of medication noncompliance. He developed pulseless VT and was shocked gain. He was started on IV amiodarone and discharged with plan to discuss ICD. He refused LifeVest after discharge.   The patient was seen 03/17/22 by EP for VT and set up for ICD implantation.   Hospital Course     Consultants: N/A  The patient arrived for scheduled  procedure on 04/07/22. Patient successfully underwent ICD implantation with Medtronic device, there were no immediate complications. Patient remained stable post-procedure. Plan to continue all current medications at discharge.   The patient was evaluated by Dr. Lalla Brothers on 04/07/22 and felt to be stable for discharge.   GEN:  Well nourished, well developed in no acute distress HEENT: Normal NECK: No JVD; No carotid bruits LYMPHATICS: No  lymphadenopathy CARDIAC: RRR, no murmurs, rubs, gallops RESPIRATORY:  Clear to auscultation without rales, wheezing or rhonchi  ABDOMEN: Soft, non-tender, non-distended MUSCULOSKELETAL:  No edema; No deformity  SKIN: Warm and dry NEUROLOGIC:  Alert and oriented x 3 PSYCHIATRIC:  Normal affect   Did the patient have an acute coronary syndrome (MI, NSTEMI, STEMI, etc) this admission?:  No                               Did the patient have a percutaneous coronary intervention (stent / angioplasty)?:  No.      _____________  Discharge Vitals Blood pressure (!) 93/49, pulse 62, temperature 98.4 F (36.9 C), temperature source Oral, resp. rate 19, height 5\' 6"  (1.676 m), weight 98.9 kg, SpO2 94 %.  Filed Weights   04/07/22 0657  Weight: 98.9 kg    Labs & Radiologic Studies    CBC No results for input(s): "WBC", "NEUTROABS", "HGB", "HCT", "MCV", "PLT" in the last 72 hours. Basic Metabolic Panel No results for input(s): "NA", "K", "CL", "CO2", "GLUCOSE", "BUN", "CREATININE", "CALCIUM", "MG", "PHOS" in the last 72 hours. Liver Function Tests No results for input(s): "AST", "ALT", "ALKPHOS", "BILITOT", "PROT", "ALBUMIN" in the last 72 hours. No results for input(s): "LIPASE", "AMYLASE" in the last 72 hours. High Sensitivity Troponin:   No results for input(s): "TROPONINIHS" in the last 720 hours.  BNP Invalid input(s): "POCBNP" D-Dimer No results for input(s): "DDIMER" in the last 72 hours. Hemoglobin A1C No results for input(s): "HGBA1C" in the last 72 hours. Fasting Lipid Panel No results for input(s): "CHOL", "HDL", "LDLCALC", "TRIG", "CHOLHDL", "LDLDIRECT" in the last 72 hours. Thyroid Function Tests No results for input(s): "TSH", "T4TOTAL", "T3FREE", "THYROIDAB" in the last 72 hours.  Invalid input(s): "FREET3" _____________  No results found. Disposition   Pt is being discharged home today in good condition.  Follow-up Plans & Appointments     Follow-up Information      06/07/22, MD. Call.   Specialties: Cardiology, Radiology Why: As needed Contact information: [address] [place] [phone]                Discharge Instructions     Diet - low sodium heart healthy   Complete by: As directed    Increase activity slowly   Complete by: As directed        Discharge Medications   Allergies as of 04/07/2022       Reactions   Contrast Media [iodinated Contrast Media] Shortness Of Breath   Iohexol Shortness Of Breath    Onset Date: 06/07/2022   Jardiance [empagliflozin] Other (See Comments)   Fatigue/weakness   Atorvastatin Other (See Comments)   Myalgias   Hydrocodone Itching   Morphine And Related Other (See Comments)   Lost control    Penicillins Other (See Comments)   Unknown reaction Did it involve swelling of the face/tongue/throat, SOB, or low BP? Unknown Did it involve sudden or severe rash/hives, skin peeling, or any reaction on the inside  of your mouth or nose? Unknown Did you need to seek medical attention at a hospital or doctor's office? Yes When did it last happen?      Childhood allergy  If all above answers are "NO", may proceed with cephalosporin use.   Rosuvastatin Other (See Comments)   Myalgias        Medication List     TAKE these medications    amiodarone 200 MG tablet Commonly known as: PACERONE Take 1 tablet (200 mg total) by mouth 2 (two) times daily.   aspirin EC 81 MG tablet Take 1 tablet (81 mg total) by mouth daily. Swallow whole.   carvedilol 3.125 MG tablet Commonly known as: COREG Take 1 tablet (3.125 mg total) by mouth 2 (two) times daily with a meal.   Entresto 49-51 MG Generic drug: sacubitril-valsartan Take 1 tablet by mouth 2 (two) times daily.   ezetimibe 10 MG tablet Commonly known as: ZETIA Take 1 tablet (10 mg total) by mouth daily.   furosemide 40 MG tablet Commonly known as: LASIX Take 1 tablet (40 mg total) by mouth once daily  as needed.   Jardiance 10 MG Tabs tablet Generic drug: empagliflozin Take 10 mg by mouth daily.   metFORMIN 500 MG tablet Commonly known as: GLUCOPHAGE Take 500 mg by mouth 2 (two) times daily with a meal.   nitroGLYCERIN 0.4 MG SL tablet Commonly known as: NITROSTAT Place 1 tablet (0.4 mg total) under the tongue every 5 (five) minutes as needed for chest pain.   traMADol 50 MG tablet Commonly known as: Ultram Take 1 tablet (50 mg total) by mouth every 8 (eight) hours as needed.           Outstanding Labs/Studies   N/A  Duration of Discharge Encounter   Greater than 30 minutes including physician time.  Signed, Sherrelle Prochazka David Stall, PA-C 04/07/2022, 11:58 AM

## 2022-04-07 NOTE — Progress Notes (Signed)
While changing to go home, patient began to raise arm above head to get dressed; RN reminded him that he was supposed to keep his arm below the level of his shoulder. Patient refused RN assistance while dressing. Patient started to carry belonging's with affected arm; RN reminded patient that he is not supposed to carry his belonging's with affected arm. Patient placed his phone in pocket infront of new ICD. RN reminded patient that the cell phone signal can interfere with his pacemaker. Patient did not acknowledge RN. RN asked patient if he was understanding the instructions. Patient reported he heard RN, but instructions were unreasonable. He was going to have to use the affected to carry belongings. He would have to sleep on his side. RN reinforced why the instructions were for his safety. Patient ignored RN and instead asked where th restroom was.   Patient did consent to be rolled out by [employer]. Patient reportedly removed his sling before getting into his family member's vehicle. MD messaged.

## 2022-04-09 ENCOUNTER — Encounter: Payer: Self-pay | Admitting: Cardiology

## 2022-04-11 ENCOUNTER — Encounter: Payer: Self-pay | Admitting: Pulmonary Disease

## 2022-04-12 ENCOUNTER — Telehealth: Payer: Self-pay

## 2022-04-12 NOTE — Telephone Encounter (Signed)
I called the patient to get his monitor serial number so I can assign his monitor in [facility]. He was not at home. He told me he was jacking a house up. He asked me how long was he supposed to hurt? I let him speak with Leafy Ro, rn.   I told him to call us when he get home and we can assigned the monitor.  Leafy Ro also needs a manual transmission to check his leads.

## 2022-04-12 NOTE — Telephone Encounter (Signed)
Patient calls in to ask if the area where his device was implanted should still hurt?   I spoke with patient to assess his concerns.  Also, noted that patient has returned to work and is NOT following lift/push/pull restrictions.  I reviewed with him the seriousness of this and asked that he STOP working immediately and follow the nothing heavier than 10lbs or (1 gallon of milk) restriction.    As far as his discomfort, he states that nothing is severe but is uncomfortable.  I informed him that the area may be sore for a few weeks or more following the procedure but should improve.  He may take Tylenol (if not other contraindications) for the discomfort. Also, reviewed bathing instructions and reminded that he cannot get bandage wet until he sees [place] next week in clinic.   Patient verbalizes understanding and states he will immediately cease and follow lifting instructions. Will wait to see patients remote transmission to see if any concerns for lead dislodgement due to not following care advice.

## 2022-04-12 NOTE — Telephone Encounter (Signed)
I called the patient back and he was not at home. He is going to call me tomorrow with the serial number for the monitor.

## 2022-04-13 NOTE — Telephone Encounter (Signed)
Call back received from Pt.  He believes the serial code for his monitor is [id number].  Advised would call him back if this is not correct.

## 2022-04-15 ENCOUNTER — Ambulatory Visit: Payer: Medicaid Other | Attending: Pulmonary Disease

## 2022-04-15 DIAGNOSIS — J449 Chronic obstructive pulmonary disease, unspecified: Secondary | ICD-10-CM | POA: Insufficient documentation

## 2022-04-15 DIAGNOSIS — R0602 Shortness of breath: Secondary | ICD-10-CM | POA: Insufficient documentation

## 2022-04-15 LAB — PULMONARY FUNCTION TEST ARMC ONLY
DL/VA % pred: 58 %
DL/VA: 2.46 ml/min/mmHg/L
DLCO unc % pred: 45 %
DLCO unc: 10.63 ml/min/mmHg
FEF 25-75 Post: 2.23 L/sec
FEF 25-75 Pre: 1.76 L/sec
FEF2575-%Change-Post: 26 %
FEF2575-%Pred-Post: 94 %
FEF2575-%Pred-Pre: 74 %
FEV1-%Change-Post: 5 %
FEV1-%Pred-Post: 76 %
FEV1-%Pred-Pre: 72 %
FEV1-Post: 2.26 L
FEV1-Pre: 2.15 L
FEV1FVC-%Change-Post: 5 %
FEV1FVC-%Pred-Pre: 99 %
FEV6-%Change-Post: -1 %
FEV6-%Pred-Post: 75 %
FEV6-%Pred-Pre: 77 %
FEV6-Post: 2.83 L
FEV6-Pre: 2.89 L
FEV6FVC-%Change-Post: 0 %
FEV6FVC-%Pred-Post: 105 %
FEV6FVC-%Pred-Pre: 105 %
FVC-%Change-Post: 0 %
FVC-%Pred-Post: 73 %
FVC-%Pred-Pre: 73 %
FVC-Post: 2.89 L
FVC-Pre: 2.89 L
Post FEV1/FVC ratio: 78 %
Post FEV6/FVC ratio: 100 %
Pre FEV1/FVC ratio: 74 %
Pre FEV6/FVC Ratio: 100 %
RV % pred: 75 %
RV: 1.6 L
TLC % pred: 69 %
TLC: 4.31 L

## 2022-04-15 MED ORDER — ALBUTEROL SULFATE (2.5 MG/3ML) 0.083% IN NEBU
2.5000 mg | INHALATION_SOLUTION | Freq: Once | RESPIRATORY_TRACT | Status: AC
  Administered 2022-04-15: 2.5 mg via RESPIRATORY_TRACT
  Filled 2022-04-15: qty 3

## 2022-04-19 ENCOUNTER — Other Ambulatory Visit: Payer: Self-pay

## 2022-04-19 ENCOUNTER — Emergency Department
Admission: EM | Admit: 2022-04-19 | Discharge: 2022-04-19 | Disposition: A | Discharge status: Home / Self Care | Payer: Medicaid Other | Attending: Emergency Medicine | Admitting: Emergency Medicine

## 2022-04-19 DIAGNOSIS — J449 Chronic obstructive pulmonary disease, unspecified: Secondary | ICD-10-CM | POA: Diagnosis not present

## 2022-04-19 DIAGNOSIS — E119 Type 2 diabetes mellitus without complications: Secondary | ICD-10-CM | POA: Insufficient documentation

## 2022-04-19 DIAGNOSIS — M545 Low back pain, unspecified: Secondary | ICD-10-CM | POA: Diagnosis present

## 2022-04-19 DIAGNOSIS — I110 Hypertensive heart disease with heart failure: Secondary | ICD-10-CM | POA: Diagnosis not present

## 2022-04-19 DIAGNOSIS — I5023 Acute on chronic systolic (congestive) heart failure: Secondary | ICD-10-CM | POA: Insufficient documentation

## 2022-04-19 DIAGNOSIS — I2510 Atherosclerotic heart disease of native coronary artery without angina pectoris: Secondary | ICD-10-CM | POA: Insufficient documentation

## 2022-04-19 LAB — COMPREHENSIVE METABOLIC PANEL
ALT: 14 U/L (ref 0–44)
AST: 21 U/L (ref 15–41)
Albumin: 4.2 g/dL (ref 3.5–5.0)
Alkaline Phosphatase: 84 U/L (ref 38–126)
Anion gap: 6 (ref 5–15)
BUN: 9 mg/dL (ref 8–23)
CO2: 25 mmol/L (ref 22–32)
Calcium: 8.7 mg/dL — ABNORMAL LOW (ref 8.9–10.3)
Chloride: 106 mmol/L (ref 98–111)
Creatinine, Ser: 0.81 mg/dL (ref 0.61–1.24)
GFR, Estimated: >60 mL/min (ref >60)
Glucose, Bld: 214 mg/dL — ABNORMAL HIGH (ref 70–99)
Potassium: 3.4 mmol/L — ABNORMAL LOW (ref 3.5–5.1)
Sodium: 137 mmol/L (ref 135–145)
Total Bilirubin: 1 mg/dL (ref 0.3–1.2)
Total Protein: 7.6 g/dL (ref 6.5–8.1)

## 2022-04-19 LAB — URINALYSIS, ROUTINE W REFLEX MICROSCOPIC
Bilirubin Urine: NEGATIVE
Glucose, UA: >=500 mg/dL — AB
Hgb urine dipstick: NEGATIVE
Ketones, ur: NEGATIVE mg/dL
Leukocytes,Ua: NEGATIVE
Nitrite: NEGATIVE
Protein, ur: 100 mg/dL — AB
Specific Gravity, Urine: 1.022 (ref 1.005–1.030)
Squamous Epithelial / HPF: NONE SEEN (ref 0–5)
pH: 5 (ref 5.0–8.0)

## 2022-04-19 LAB — CBC WITH DIFFERENTIAL/PLATELET
Abs Immature Granulocytes: 0.08 10*3/uL — ABNORMAL HIGH (ref 0.00–0.07)
Basophils Absolute: 0.1 10*3/uL (ref 0.0–0.1)
Basophils Relative: 1 %
Eosinophils Absolute: 0.2 10*3/uL (ref 0.0–0.5)
Eosinophils Relative: 2 %
HCT: 50.5 % (ref 39.0–52.0)
Hemoglobin: 17.4 g/dL — ABNORMAL HIGH (ref 13.0–17.0)
Immature Granulocytes: 1 %
Lymphocytes Relative: 16 %
Lymphs Abs: 2 10*3/uL (ref 0.7–4.0)
MCH: 29.5 pg (ref 26.0–34.0)
MCHC: 34.5 g/dL (ref 30.0–36.0)
MCV: 85.6 fL (ref 80.0–100.0)
Monocytes Absolute: 1.2 10*3/uL — ABNORMAL HIGH (ref 0.1–1.0)
Monocytes Relative: 9 %
Neutro Abs: 9 10*3/uL — ABNORMAL HIGH (ref 1.7–7.7)
Neutrophils Relative %: 71 %
Platelets: 252 10*3/uL (ref 150–400)
RBC: 5.9 MIL/uL — ABNORMAL HIGH (ref 4.22–5.81)
RDW: 13.9 % (ref 11.5–15.5)
WBC: 12.5 10*3/uL — ABNORMAL HIGH (ref 4.0–10.5)
nRBC: 0 % (ref 0.0–0.2)

## 2022-04-19 MED ORDER — OXYCODONE HCL 5 MG PO TABS
5.0000 mg | ORAL_TABLET | Freq: Three times a day (TID) | ORAL | 0 refills | Status: AC | PRN
  Filled 2022-04-19: qty 15, 5d supply, fill #0

## 2022-04-19 MED ORDER — LIDOCAINE 5 % EX PTCH
1.0000 | MEDICATED_PATCH | CUTANEOUS | 0 refills | Status: DC
  Filled 2022-04-19: qty 30, 30d supply, fill #0

## 2022-04-19 MED ORDER — OXYCODONE-ACETAMINOPHEN 5-325 MG PO TABS
1.0000 | ORAL_TABLET | Freq: Once | ORAL | Status: AC
  Administered 2022-04-19: 1 via ORAL
  Filled 2022-04-19: qty 1

## 2022-04-19 MED ORDER — LIDOCAINE 5 % EX PTCH
1.0000 | MEDICATED_PATCH | CUTANEOUS | Status: DC
  Administered 2022-04-19: 1 via TRANSDERMAL
  Filled 2022-04-19: qty 1

## 2022-04-19 NOTE — Discharge Instructions (Signed)
You can take the oxycodone and use the Lidoderm patch for your back pain.  I would also take 1 g of Tylenol every 8 hours.  Please follow-up with your orthopedist.  Return to the emergency department for fever inability to walk or any new numbness or weakness in your legs.

## 2022-04-19 NOTE — ED Triage Notes (Signed)
Pt arrives with c/o lower back pain that started 2 days ago. Pt hs hx of kidney stones. Pt endorses issues urinating and some constipation.

## 2022-04-19 NOTE — ED Notes (Signed)
Pt Dc to home. Dc instructions reviewed with all questions answered. Pt verbalizes understanding. Pt assisted out of dept via wheelchair with family member 
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
## 2022-04-19 NOTE — ED Provider Triage Note (Signed)
Emergency Medicine Provider Triage Evaluation Note  Glen Cabal., a 66 y.o. male with a history of kidney stones, A-fib with recent cardioversion, CHF was evaluated in triage.  Pt complains of right sided LBP.  He notes symptoms about 2 to 3 days earlier.  He also endorses some constipation and some urinary flow difficulty.  Patient gives remote history of kidney stones.  He is about 2 weeks status post an ICD implantation.  Review of Systems  Positive: LBP, urinary retention   Negative: CP, SOB  Physical Exam  BP (!) 189/112   Pulse 66   Resp 18   Ht 5\' 6"  (1.676 m)   Wt 98.9 kg   SpO2 99%   BMI 35.19 kg/m  Gen:   Awake, no distress  NAD Resp:  Normal effort CTA MSK:   Moves extremities without difficulty  Other:    Medical Decision Making  Medically screening exam initiated at 3:42 PM.  Appropriate orders placed.  Glen Cabal. was informed that the remainder of the evaluation will be completed by another provider, this initial triage assessment does not replace that evaluation, and the importance of remaining in the ED until their evaluation is complete.  Patient to the ED for evaluation of right-sided low back pain as well as some urinary retention.  Patient is 2 weeks status post ICD placement.   Melvenia Needles, PA-C 04/19/22 1548

## 2022-04-20 ENCOUNTER — Other Ambulatory Visit: Payer: Self-pay

## 2022-04-20 ENCOUNTER — Emergency Department
Admission: EM | Admit: 2022-04-20 | Discharge: 2022-04-20 | Disposition: A | Discharge status: Home / Self Care | Payer: Medicaid Other | Attending: Student in an Organized Health Care Education/Training Program | Admitting: Student in an Organized Health Care Education/Training Program

## 2022-04-20 ENCOUNTER — Emergency Department: Payer: Medicaid Other

## 2022-04-20 DIAGNOSIS — D72829 Elevated white blood cell count, unspecified: Secondary | ICD-10-CM | POA: Insufficient documentation

## 2022-04-20 DIAGNOSIS — R109 Unspecified abdominal pain: Secondary | ICD-10-CM | POA: Insufficient documentation

## 2022-04-20 DIAGNOSIS — E119 Type 2 diabetes mellitus without complications: Secondary | ICD-10-CM | POA: Insufficient documentation

## 2022-04-20 DIAGNOSIS — S32009A Unspecified fracture of unspecified lumbar vertebra, initial encounter for closed fracture: Secondary | ICD-10-CM

## 2022-04-20 DIAGNOSIS — M545 Low back pain, unspecified: Secondary | ICD-10-CM | POA: Diagnosis present

## 2022-04-20 LAB — COMPREHENSIVE METABOLIC PANEL
ALT: 14 U/L (ref 0–44)
AST: 19 U/L (ref 15–41)
Albumin: 4.3 g/dL (ref 3.5–5.0)
Alkaline Phosphatase: 82 U/L (ref 38–126)
Anion gap: 13 (ref 5–15)
BUN: 10 mg/dL (ref 8–23)
CO2: 20 mmol/L — ABNORMAL LOW (ref 22–32)
Calcium: 9.1 mg/dL (ref 8.9–10.3)
Chloride: 102 mmol/L (ref 98–111)
Creatinine, Ser: 0.69 mg/dL (ref 0.61–1.24)
GFR, Estimated: >60 mL/min (ref >60)
Glucose, Bld: 250 mg/dL — ABNORMAL HIGH (ref 70–99)
Potassium: 3.4 mmol/L — ABNORMAL LOW (ref 3.5–5.1)
Sodium: 135 mmol/L (ref 135–145)
Total Bilirubin: 1.6 mg/dL — ABNORMAL HIGH (ref 0.3–1.2)
Total Protein: 8 g/dL (ref 6.5–8.1)

## 2022-04-20 LAB — CBC WITH DIFFERENTIAL/PLATELET
Abs Immature Granulocytes: 0.09 10*3/uL — ABNORMAL HIGH (ref 0.00–0.07)
Basophils Absolute: 0 10*3/uL (ref 0.0–0.1)
Basophils Relative: 0 %
Eosinophils Absolute: 0 10*3/uL (ref 0.0–0.5)
Eosinophils Relative: 0 %
HCT: 50.2 % (ref 39.0–52.0)
Hemoglobin: 17.7 g/dL — ABNORMAL HIGH (ref 13.0–17.0)
Immature Granulocytes: 1 %
Lymphocytes Relative: 10 %
Lymphs Abs: 1.5 10*3/uL (ref 0.7–4.0)
MCH: 29.5 pg (ref 26.0–34.0)
MCHC: 35.3 g/dL (ref 30.0–36.0)
MCV: 83.7 fL (ref 80.0–100.0)
Monocytes Absolute: 0.8 10*3/uL (ref 0.1–1.0)
Monocytes Relative: 6 %
Neutro Abs: 12.2 10*3/uL — ABNORMAL HIGH (ref 1.7–7.7)
Neutrophils Relative %: 83 %
Platelets: 283 10*3/uL (ref 150–400)
RBC: 6 MIL/uL — ABNORMAL HIGH (ref 4.22–5.81)
RDW: 13.7 % (ref 11.5–15.5)
WBC: 14.6 10*3/uL — ABNORMAL HIGH (ref 4.0–10.5)
nRBC: 0 % (ref 0.0–0.2)

## 2022-04-20 MED ORDER — ONDANSETRON 4 MG PO TBDP
ORAL_TABLET | ORAL | 0 refills | Status: DC
  Filled 2022-04-20: qty 3, 1d supply, fill #0

## 2022-04-20 MED ORDER — HYDROMORPHONE HCL 1 MG/ML IJ SOLN
0.5000 mg | Freq: Once | INTRAMUSCULAR | Status: AC
  Administered 2022-04-20: 0.5 mg via INTRAVENOUS
  Filled 2022-04-20: qty 0.5

## 2022-04-20 MED ORDER — HYDROMORPHONE HCL 1 MG/ML IJ SOLN
0.5000 mg | INTRAMUSCULAR | Status: DC | PRN
  Administered 2022-04-20: 0.5 mg via INTRAVENOUS
  Filled 2022-04-20: qty 0.5

## 2022-04-20 NOTE — ED Provider Notes (Signed)
Patient signed out to me pending CT L-spine.  In brief this is a 66 year old male presenting with atraumatic back pain.  Seen in the ED yesterday and then followed up with [facility] was told to come to the ED for an MRI.  Unfortunately patient is not able to get an MRI because he has a pacemaker.  CT abdomen pelvis obtained is negative for acute findings.  Labs notable for leukocytosis.  CT lumbar spine shows a anterior-inferior nondisplaced fracture of L2 at an osteophyte as well as degenerative disease with foraminal stenosis.  Discussed with Dr. Cari Caraway with neurosurgery who agrees patient can be discharged.  Patient's pain on my assessment is well controlled and I think that he is appropriate for discharge and outpatient follow-up.  Referred to neurosurgery.   Rada Hay, MD 04/20/22 [phone]

## 2022-04-20 NOTE — ED Provider Notes (Signed)
[facility] Provider Note    Event Date/Time   First MD Initiated Contact with Patient 04/20/22 1427     (approximate)   History   Back Pain   HPI  [name]. is a 66 y.o. male   history of type 2 diabetes is well as degenerative disc disease presents to the ER for evaluation of recurrent right lower back pain.  Seen yesterday for the same went to follow-up with [facility] today and was told that they could not do anything and sent back to the ER.  He is having severe pain worse with movement.  No numbness or tingling.  No saddle anesthesia.  Denies any urinary incontinence or bowel incontinence.  No took some tramadol with minimal relief.  Denies any fevers no dysuria.      Physical Exam   Triage Vital Signs: ED Triage Vitals  Enc Vitals Group     BP 04/20/22 1018 (!) 206/113     Pulse Rate 04/20/22 1018 73     Resp 04/20/22 1018 20     Temp 04/20/22 1018 98.4 F (36.9 C)     Temp Source 04/20/22 1418 Oral     SpO2 04/20/22 1018 98 %     Weight --      Height --      Head Circumference --      Peak Flow --      Pain Score 04/20/22 1016 10     Pain Loc --      Pain Edu? --      Excl. in [place]? --     Most recent vital signs: Vitals:   04/20/22 1018 04/20/22 1418  BP: (!) 206/113 (!) 201/111  Pulse: 73 70  Resp: 20 19  Temp: 98.4 F (36.9 C) 98.4 F (36.9 C)  SpO2: 98% 99%     Constitutional: Alert  Eyes: Conjunctivae are normal.  Head: Atraumatic. Nose: No congestion/rhinnorhea. Mouth/Throat: Mucous membranes are moist.   Neck: Painless ROM.  Cardiovascular:   Good peripheral circulation. Respiratory: Normal respiratory effort.  No retractions.  Gastrointestinal: Soft and nontender.  Musculoskeletal:  no deformity.  No overlying erythema no step-offs or deformity. Neurologic:  MAE spontaneously. No gross focal neurologic deficits are appreciated.  Skin:  Skin is warm, dry and intact. No rash noted. Psychiatric: Mood and  affect are normal. Speech and behavior are normal.    ED Results / Procedures / Treatments   Labs (all labs ordered are listed, but only abnormal results are displayed) Labs Reviewed  CBC WITH DIFFERENTIAL/PLATELET - Abnormal; Notable for the following components:      Result Value   WBC 14.6 (*)    RBC 6.00 (*)    Hemoglobin 17.7 (*)    Neutro Abs 12.2 (*)    Abs Immature Granulocytes 0.09 (*)    All other components within normal limits  COMPREHENSIVE METABOLIC PANEL - Abnormal; Notable for the following components:   Potassium 3.4 (*)    CO2 20 (*)    Glucose, Bld 250 (*)    Total Bilirubin 1.6 (*)    All other components within normal limits  URINALYSIS, ROUTINE W REFLEX MICROSCOPIC     EKG     RADIOLOGY Please see ED Course for my review and interpretation.  I personally reviewed all radiographic images ordered to evaluate for the above acute complaints and reviewed radiology reports and findings.  These findings were personally discussed with the patient.  Please see  medical record for radiology report.    PROCEDURES:  Critical Care performed:   Procedures   MEDICATIONS ORDERED IN ED: Medications  HYDROmorphone (DILAUDID) injection 0.5 mg (has no administration in time range)     IMPRESSION / MDM / ASSESSMENT AND PLAN / ED COURSE  I reviewed the triage vital signs and the nursing notes.                              Differential diagnosis includes, but is not limited to, lumbago, fracture, discitis, spinal stenosis, cauda equina, stone, pyelo-, colitis   Patient presenting to the ER for evaluation of symptoms as described above.  Based on symptoms, risk factors and considered above differential, this presenting complaint could reflect a potentially life-threatening illness therefore the patient will be placed on continuous pulse oximetry and telemetry for monitoring.  Laboratory evaluation will be sent to evaluate for the above complaints.  Patient  presented with recurrent back pain.  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  I do not appreciate any acute neurodeficits the symptoms seem to be chronic and more pain related.  I have a lower suspicion for cauda equina or spinal stenosis.  Does have mild leukocytosis as compared to yesterday but no fevers.  May be pain related we will order IV Dilaudid for pain control will order CT imaging   Clinical Course as of 04/20/22 1536  Tue Apr 20, 2022  1510 CT imaging on my review and interpretation does not show any evidence of perforation or obstruction.  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  Patient be signed out to oncoming physician pending follow-up imaging and reassessment. [PR]  1530 CT imaging of the abdomen without acute abnormality still awaiting formal lumbar read.  Patient be signed out to oncoming physician pending follow-up urinalysis CT lumbar as well as reassessment for pain control. [PR]    Clinical Course User Index [PR] Willy Eddy, MD    FINAL CLINICAL IMPRESSION(S) / ED DIAGNOSES   Final diagnoses:  Low back pain, unspecified back pain laterality, unspecified chronicity, unspecified whether sciatica present     Rx / DC Orders   ED Discharge Orders     None        Note:  This document was prepared using Dragon voice recognition software and may include unintentional dictation errors.    Willy Eddy, MD 04/20/22 1536

## 2022-04-20 NOTE — Discharge Instructions (Signed)
Your CAT scan shows you have a nondisplaced fracture of your second lumbar spine vertebrae which could be causing your pain.  You also have degenerative disease in your spine which is likely causing compression of some of the nerves.  Please follow-up with either your orthopedist or neurosurgery.

## 2022-04-20 NOTE — ED Notes (Signed)
First nurse note:  Pt seen last night for same. Pt states he went to PCP and states he was sent back to ED for MRI.

## 2022-04-21 ENCOUNTER — Ambulatory Visit: Payer: Medicaid Other | Attending: Cardiology

## 2022-04-21 DIAGNOSIS — I472 Ventricular tachycardia, unspecified: Secondary | ICD-10-CM

## 2022-04-21 LAB — CUP PACEART INCLINIC DEVICE CHECK
Battery Remaining Longevity: 111 mo
Battery Voltage: 3.02 V
Brady Statistic AP VP Percent: 0.04 %
Brady Statistic AP VS Percent: 75.73 %
Brady Statistic AS VP Percent: 0 %
Brady Statistic AS VS Percent: 24.23 %
Brady Statistic RA Percent Paced: 74.69 %
Brady Statistic RV Percent Paced: 0.04 %
Date Time Interrogation Session: 20231018095600
HighPow Impedance: 71 Ohm
Implantable Lead Implant Date: 20231004
Implantable Lead Implant Date: 20231004
Implantable Lead Location: 753859
Implantable Lead Location: 753860
Implantable Lead Model: 5076
Implantable Pulse Generator Implant Date: 20231004
Lead Channel Impedance Value: 399 Ohm
Lead Channel Impedance Value: 494 Ohm
Lead Channel Impedance Value: 532 Ohm
Lead Channel Pacing Threshold Amplitude: 0.75 V
Lead Channel Pacing Threshold Amplitude: 0.875 V
Lead Channel Pacing Threshold Pulse Width: 0.4 ms
Lead Channel Pacing Threshold Pulse Width: 0.4 ms
Lead Channel Sensing Intrinsic Amplitude: 3 mV
Lead Channel Sensing Intrinsic Amplitude: 3 mV
Lead Channel Sensing Intrinsic Amplitude: 31.625 mV
Lead Channel Sensing Intrinsic Amplitude: 31.625 mV
Lead Channel Setting Pacing Amplitude: 3.5 V
Lead Channel Setting Pacing Amplitude: 3.5 V
Lead Channel Setting Pacing Pulse Width: 0.4 ms
Lead Channel Setting Sensing Sensitivity: 0.3 mV

## 2022-04-21 NOTE — Progress Notes (Signed)

## 2022-04-21 NOTE — Patient Instructions (Signed)
   After Your ICD (Implantable Cardiac Defibrillator)    Monitor your defibrillator site for redness, swelling, and drainage. Call the device clinic at [phone] if you experience these symptoms or fever/chills.  Your incision was closed with Steri-strips or staples:  You may shower 7 days after your procedure and wash your incision with soap and water. Avoid lotions, ointments, or perfumes over your incision until it is well-healed.    You may use a hot tub or a pool after your wound check appointment if the incision is completely closed.  Do not lift, push or pull greater than 10 pounds with the affected arm until 6 weeks after your procedure. There are no other restrictions in arm movement after your wound check appointment.  Your ICD is MRI compatible.  Your ICD is designed to protect you from life threatening heart rhythms. Because of this, you may receive a shock.   1 shock with no symptoms:  Call the office during business hours. 1 shock with symptoms (chest pain, chest pressure, dizziness, lightheadedness, shortness of breath, overall feeling unwell):  Call 911. If you experience 2 or more shocks in 24 hours:  Call 911. If you receive a shock, you should not drive.  [place] DMV - no driving for 6 months if you receive appropriate therapy from your ICD.   ICD Alerts:  Some alerts are vibratory and others beep. These are NOT emergencies. Please call our office to let us know. If this occurs at night or on weekends, it can wait until the next business day. Send a remote transmission.  If your device is capable of reading fluid status (for heart failure), you will be offered monthly monitoring to review this with you.   Remote monitoring is used to monitor your ICD from home. This monitoring is scheduled every 91 days by our office. It allows us to keep an eye on the functioning of your device to ensure it is working properly. You will routinely see your Electrophysiologist annually  (more often if necessary).   

## 2022-04-23 ENCOUNTER — Telehealth: Payer: Self-pay | Admitting: Neurosurgery

## 2022-04-23 NOTE — Telephone Encounter (Signed)
-----   Message from Peggyann Shoals sent at 04/23/2022 10:54 AM EDT ----- Regarding: ER fu Contact: [phone] Patient seen in the ER on 04/20/2022 for low back pain. "CT lumbar spine shows a anterior-inferior nondisplaced fracture of L2 at an osteophyte as well as degenerative disease with foraminal stenosis.  Discussed with Dr. Cari Caraway with neurosurgery who agrees patient can be discharged.  Patient's pain on my assessment is well controlled and I think that he is appropriate for discharge and outpatient follow-up.  Referred to neurosurgery."  Patient was contacted to schedule a follow-up appt with Geronimo Boot PA at next opening. He declined appt at this time. He said that he is going to [facility] next week.

## 2022-04-28 ENCOUNTER — Ambulatory Visit: Payer: Medicaid Other | Admitting: Pulmonary Disease

## 2022-05-12 NOTE — Telephone Encounter (Signed)
Error

## 2022-05-19 ENCOUNTER — Other Ambulatory Visit: Payer: Self-pay

## 2022-05-19 MED ORDER — TRAMADOL HCL 50 MG PO TABS
50.0000 mg | ORAL_TABLET | Freq: Three times a day (TID) | ORAL | 0 refills | Status: DC | PRN
  Filled 2022-05-19: qty 15, 5d supply, fill #0

## 2022-05-21 ENCOUNTER — Other Ambulatory Visit: Payer: Self-pay

## 2022-05-21 ENCOUNTER — Emergency Department: Payer: Medicaid Other

## 2022-05-21 ENCOUNTER — Emergency Department
Admission: EM | Admit: 2022-05-21 | Discharge: 2022-05-22 | Disposition: A | Discharge status: Home / Self Care | Payer: Medicaid Other | Attending: Emergency Medicine | Admitting: Emergency Medicine

## 2022-05-21 DIAGNOSIS — R0602 Shortness of breath: Secondary | ICD-10-CM | POA: Diagnosis not present

## 2022-05-21 DIAGNOSIS — G8929 Other chronic pain: Secondary | ICD-10-CM | POA: Diagnosis not present

## 2022-05-21 DIAGNOSIS — R42 Dizziness and giddiness: Secondary | ICD-10-CM | POA: Diagnosis not present

## 2022-05-21 DIAGNOSIS — Z1152 Encounter for screening for COVID-19: Secondary | ICD-10-CM | POA: Diagnosis not present

## 2022-05-21 DIAGNOSIS — M549 Dorsalgia, unspecified: Secondary | ICD-10-CM | POA: Insufficient documentation

## 2022-05-21 LAB — CBC WITH DIFFERENTIAL/PLATELET
Abs Immature Granulocytes: 0.04 10*3/uL (ref 0.00–0.07)
Basophils Absolute: 0.1 10*3/uL (ref 0.0–0.1)
Basophils Relative: 1 %
Eosinophils Absolute: 0.1 10*3/uL (ref 0.0–0.5)
Eosinophils Relative: 1 %
HCT: 43.4 % (ref 39.0–52.0)
Hemoglobin: 15.7 g/dL (ref 13.0–17.0)
Immature Granulocytes: 0 %
Lymphocytes Relative: 14 %
Lymphs Abs: 1.5 10*3/uL (ref 0.7–4.0)
MCH: 29.6 pg (ref 26.0–34.0)
MCHC: 36.2 g/dL — ABNORMAL HIGH (ref 30.0–36.0)
MCV: 81.7 fL (ref 80.0–100.0)
Monocytes Absolute: 1 10*3/uL (ref 0.1–1.0)
Monocytes Relative: 9 %
Neutro Abs: 7.7 10*3/uL (ref 1.7–7.7)
Neutrophils Relative %: 75 %
Platelets: 162 10*3/uL (ref 150–400)
RBC: 5.31 MIL/uL (ref 4.22–5.81)
RDW: 13.2 % (ref 11.5–15.5)
WBC: 10.3 10*3/uL (ref 4.0–10.5)
nRBC: 0 % (ref 0.0–0.2)

## 2022-05-21 LAB — BASIC METABOLIC PANEL
Anion gap: 7 (ref 5–15)
BUN: 12 mg/dL (ref 8–23)
CO2: 23 mmol/L (ref 22–32)
Calcium: 8 mg/dL — ABNORMAL LOW (ref 8.9–10.3)
Chloride: 111 mmol/L (ref 98–111)
Creatinine, Ser: 0.86 mg/dL (ref 0.61–1.24)
GFR, Estimated: >60 mL/min (ref >60)
Glucose, Bld: 126 mg/dL — ABNORMAL HIGH (ref 70–99)
Potassium: 3.2 mmol/L — ABNORMAL LOW (ref 3.5–5.1)
Sodium: 141 mmol/L (ref 135–145)

## 2022-05-21 LAB — RESP PANEL BY RT-PCR (FLU A&B, COVID) ARPGX2
Influenza A by PCR: NEGATIVE
Influenza B by PCR: NEGATIVE
SARS Coronavirus 2 by RT PCR: NEGATIVE

## 2022-05-21 LAB — TROPONIN I (HIGH SENSITIVITY): Troponin I (High Sensitivity): 23 ng/L — ABNORMAL HIGH (ref <18)

## 2022-05-21 MED ORDER — POTASSIUM CHLORIDE CRYS ER 20 MEQ PO TBCR
40.0000 meq | EXTENDED_RELEASE_TABLET | Freq: Once | ORAL | Status: AC
  Administered 2022-05-22: 40 meq via ORAL
  Filled 2022-05-21: qty 2

## 2022-05-21 MED ORDER — FENTANYL CITRATE PF 50 MCG/ML IJ SOSY
50.0000 ug | PREFILLED_SYRINGE | Freq: Once | INTRAMUSCULAR | Status: AC
  Administered 2022-05-21: 50 ug via INTRAVENOUS
  Filled 2022-05-21: qty 1

## 2022-05-21 NOTE — Discharge Instructions (Signed)
Please seek medical attention for any high fevers, chest pain, shortness of breath, change in behavior, persistent vomiting, bloody stool or any other new or concerning symptoms.  

## 2022-05-21 NOTE — ED Triage Notes (Signed)
Presents via EMS with c/o back pain and SOB, dizziness.  Back pain has been progressive over the last month, SO started yesterday and dizziness he thinks is d/t cough med he took.

## 2022-05-21 NOTE — ED Notes (Signed)
EMS gave fentanyl and zofran enroute to hospital ER

## 2022-05-21 NOTE — ED Provider Notes (Signed)
[facility] Provider Note    Event Date/Time   First MD Initiated Contact with Patient 05/21/22 2113     (approximate)   History   Shortness of breath, dizziness.   HPI  [name]. is a 66 y.o. male   who presents to the emergency department via EMS today because of concerns for dizziness and shortness of breath.  Patient's symptoms started today.  He has had a slight cough and feels like he has some phlegm that he would like to try to get out.  Additionally had some chills although no measured fevers.  Patient denies any known sick contacts although has been in the hospital frequently.  Additionally he continues to planing of chronic back pain.      Physical Exam   Triage Vital Signs: ED Triage Vitals  Enc Vitals Group     BP 05/21/22 2052 (!) 158/95     Pulse Rate 05/21/22 2052 80     Resp 05/21/22 2052 (!) 28     Temp 05/21/22 2052 98.2 F (36.8 C)     Temp Source 05/21/22 2052 Oral     SpO2 05/21/22 2052 95 %     Weight 05/21/22 2053 210 lb (95.3 kg)     Height 05/21/22 2053 5\' 6"  (1.676 m)     Head Circumference --      Peak Flow --      Pain Score 05/21/22 2053 8     Pain Loc --      Pain Edu? --      Excl. in [place]? --     Most recent vital signs: Vitals:   05/21/22 2052  BP: (!) 158/95  Pulse: 80  Resp: (!) 28  Temp: 98.2 F (36.8 C)  SpO2: 95%   General: Awake, alert, oriented. CV:  Good peripheral perfusion. Regular rate and rhythm. Resp:  Normal effort. Lungs clear. Abd:  No distention.     ED Results / Procedures / Treatments   Labs (all labs ordered are listed, but only abnormal results are displayed) Labs Reviewed - No data to display   EKG  I, 2053, attending physician, personally viewed and interpreted this EKG  EKG Time: 2054 Rate: 76 Rhythm: sinus rhythm Axis: normal Intervals: qtc 549 QRS: narrow, q waves II, III, aVF ST changes: no st elevation Impression: abnormal  ekg   RADIOLOGY I independently interpreted and visualized the CXR. My interpretation: No pneumonia. Radiology interpretation:  IMPRESSION:  No evidence of acute cardiopulmonary disease.    Stable cardiomegaly.     PROCEDURES:  Critical Care performed: No  Procedures   MEDICATIONS ORDERED IN ED: Medications - No data to display   IMPRESSION / MDM / ASSESSMENT AND PLAN / ED COURSE  I reviewed the triage vital signs and the nursing notes.                              Differential diagnosis includes, but is not limited to, UTI, covid, pneumonia, anemia, dehydration.  Patient's presentation is most consistent with acute presentation with potential threat to life or bodily function.  Patient presented to the emergency department today because of concerns for shortness of breath and dizziness.  Additionally patient has complaints of his chronic back pain.  Here in the emergency department patient is afebrile.  Not hypoxic.  Chest x-ray without any obvious pneumonia.  COVID and flu negative.  Awaiting urine  at time of signout.  I do think if urine is negative be reasonable for patient be discharged home.   FINAL CLINICAL IMPRESSION(S) / ED DIAGNOSES   Final diagnoses:  Chronic back pain, unspecified back location, unspecified back pain laterality  Dizziness    Note:  This document was prepared using Dragon voice recognition software and may include unintentional dictation errors.    Phineas Semen, MD 05/21/22 [phone]

## 2022-05-22 LAB — URINALYSIS, ROUTINE W REFLEX MICROSCOPIC
Bilirubin Urine: NEGATIVE
Glucose, UA: NEGATIVE mg/dL
Ketones, ur: 5 mg/dL — AB
Leukocytes,Ua: NEGATIVE
Nitrite: NEGATIVE
Protein, ur: 100 mg/dL — AB
Specific Gravity, Urine: 1.026 (ref 1.005–1.030)
pH: 7 (ref 5.0–8.0)

## 2022-05-22 LAB — TROPONIN I (HIGH SENSITIVITY): Troponin I (High Sensitivity): 22 ng/L — ABNORMAL HIGH (ref <18)

## 2022-05-22 NOTE — ED Provider Notes (Signed)
-----------------------------------------   2:03 AM on 05/22/2022 ----------------------------------------- Patient care assumed from Dr. Derrill Kay.  Patient's work-up shows no significant findings.  Troponin slightly elevated although unchanged on recheck.  COVID and flu are negative, CBC is normal, chemistry shows no concerning findings, urinalysis negative for infection and chest x-ray is clear.  Overall the patient appears well have updated the patient on his findings, he states he is ready to go home.  We will discharge with PCP follow-up.  Patient agreeable to plan of care.   Minna Antis, MD 05/22/22 [phone]

## 2022-05-22 NOTE — ED Notes (Signed)
Patient discharged at this time. Ambulated to lobby with independent and steady gait. Breathing unlabored speaking in full sentences. Verbalized understanding of all discharge, follow up, and medication teaching. Discharged homed with all belongings.   

## 2022-05-22 NOTE — ED Notes (Signed)
Pt twisting and turning in bed, monitors removed.  Pt beligerent and pissed d/t having to wait.  States he is "getting the hell out of here"  Dr Derrill Kay notified.

## 2022-05-22 NOTE — ED Notes (Signed)
Security notified and here to speak with pt concerning his behavior.

## 2022-06-01 ENCOUNTER — Ambulatory Visit: Payer: Medicaid Other | Attending: Cardiovascular Disease | Admitting: Cardiovascular Disease

## 2022-06-01 ENCOUNTER — Encounter: Payer: Self-pay | Admitting: Cardiovascular Disease

## 2022-06-01 ENCOUNTER — Other Ambulatory Visit: Payer: Self-pay

## 2022-06-01 VITALS — BP 178/114 | HR 71 | Ht 66.0 in | Wt 202.5 lb

## 2022-06-01 DIAGNOSIS — I5020 Unspecified systolic (congestive) heart failure: Secondary | ICD-10-CM | POA: Diagnosis not present

## 2022-06-01 DIAGNOSIS — I5022 Chronic systolic (congestive) heart failure: Secondary | ICD-10-CM

## 2022-06-01 DIAGNOSIS — I472 Ventricular tachycardia, unspecified: Secondary | ICD-10-CM

## 2022-06-01 DIAGNOSIS — I2510 Atherosclerotic heart disease of native coronary artery without angina pectoris: Secondary | ICD-10-CM

## 2022-06-01 DIAGNOSIS — E785 Hyperlipidemia, unspecified: Secondary | ICD-10-CM

## 2022-06-01 DIAGNOSIS — I255 Ischemic cardiomyopathy: Secondary | ICD-10-CM

## 2022-06-01 MED ORDER — AMIODARONE HCL 200 MG PO TABS
200.0000 mg | ORAL_TABLET | Freq: Every day | ORAL | 1 refills | Status: DC
  Filled 2022-06-01: qty 90, 90d supply, fill #0

## 2022-06-01 MED ORDER — EZETIMIBE 10 MG PO TABS
10.0000 mg | ORAL_TABLET | Freq: Every day | ORAL | 3 refills | Status: DC
  Filled 2022-09-28: qty 90, 90d supply, fill #0

## 2022-06-01 MED ORDER — AMIODARONE HCL 200 MG PO TABS
200.0000 mg | ORAL_TABLET | Freq: Every day | ORAL | 1 refills | Status: DC
  Filled 2022-09-28: qty 90, 90d supply, fill #0

## 2022-06-01 MED ORDER — CARVEDILOL 3.125 MG PO TABS: 3.1250 mg | ORAL_TABLET | Freq: Two times a day (BID) | ORAL | 3 refills | Status: DC

## 2022-06-01 MED ORDER — ENTRESTO 49-51 MG PO TABS: 1.0000 | ORAL_TABLET | Freq: Two times a day (BID) | ORAL | 3 refills | Status: DC

## 2022-06-01 NOTE — Patient Instructions (Addendum)
Medication Instructions:  TAKE Amiodarone 200 mg once daily  RESUME:   Aspirin 81 mg once daily  Carvedilol 3.125 mg twice daily  Ezetimibe (Zetia) 10 mg once daily  Entresto 49-51 mg twice daily *If you need a refill on your cardiac medications before your next appointment, please call your pharmacy*   Lab Work: None ordered If you have labs (blood work) drawn today and your tests are completely normal, you will receive your results only by: MyChart Message (if you have MyChart) OR A paper copy in the mail If you have any lab test that is abnormal or we need to change your treatment, we will call you to review the results.   Testing/Procedures: None ordered   Follow-Up: At [facility], you and your health needs are our priority.  As part of our continuing mission to provide you with exceptional heart care, we have created designated Provider Care Teams.  These Care Teams include your primary Cardiologist (physician) and Advanced Practice Providers (APPs -  Physician Assistants and Nurse Practitioners) who all work together to provide you with the care you need, when you need it.  We recommend signing up for the patient portal called "MyChart".  Sign up information is provided on this After Visit Summary.  MyChart is used to connect with patients for Virtual Visits (Telemedicine).  Patients are able to view lab/test results, encounter notes, upcoming appointments, etc.  Non-urgent messages can be sent to your provider as well.   To learn more about what you can do with MyChart, go to [email or web address].    Your next appointment:   6 month(s)  The format for your next appointment:   In Person  Provider:   You may see Lorine Bears, MD or one of the following Advanced Practice Providers on your designated Care Team:   Nicolasa Ducking, NP Eula Listen, PA-C Cadence Fransico Michael, PA-C Charlsie Quest, NP    Other Instructions A referral has been placed to Dr. Gala Romney at  the [facility]  Important Information About Sugar
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
## 2022-06-10 ENCOUNTER — Other Ambulatory Visit (HOSPITAL_COMMUNITY): Payer: Self-pay

## 2022-06-14 ENCOUNTER — Ambulatory Visit (HOSPITAL_BASED_OUTPATIENT_CLINIC_OR_DEPARTMENT_OTHER): Payer: Medicaid Other | Admitting: Internal Medicine

## 2022-06-14 ENCOUNTER — Encounter: Payer: Self-pay | Admitting: Cardiology

## 2022-06-14 ENCOUNTER — Other Ambulatory Visit: Payer: Self-pay

## 2022-06-14 ENCOUNTER — Other Ambulatory Visit
Admission: RE | Admit: 2022-06-14 | Discharge: 2022-06-14 | Disposition: A | Discharge status: Home / Self Care | Payer: Medicaid Other | Source: Ambulatory Visit | Attending: Family | Admitting: Family

## 2022-06-14 DIAGNOSIS — R531 Weakness: Secondary | ICD-10-CM | POA: Insufficient documentation

## 2022-06-14 DIAGNOSIS — I472 Ventricular tachycardia, unspecified: Secondary | ICD-10-CM | POA: Insufficient documentation

## 2022-06-14 DIAGNOSIS — I5020 Unspecified systolic (congestive) heart failure: Secondary | ICD-10-CM | POA: Diagnosis present

## 2022-06-14 DIAGNOSIS — G4733 Obstructive sleep apnea (adult) (pediatric): Secondary | ICD-10-CM | POA: Insufficient documentation

## 2022-06-14 DIAGNOSIS — Z955 Presence of coronary angioplasty implant and graft: Secondary | ICD-10-CM | POA: Diagnosis not present

## 2022-06-14 DIAGNOSIS — Z87891 Personal history of nicotine dependence: Secondary | ICD-10-CM | POA: Insufficient documentation

## 2022-06-14 DIAGNOSIS — Z79899 Other long term (current) drug therapy: Secondary | ICD-10-CM | POA: Insufficient documentation

## 2022-06-14 DIAGNOSIS — I5022 Chronic systolic (congestive) heart failure: Secondary | ICD-10-CM | POA: Insufficient documentation

## 2022-06-14 DIAGNOSIS — Z8673 Personal history of transient ischemic attack (TIA), and cerebral infarction without residual deficits: Secondary | ICD-10-CM | POA: Insufficient documentation

## 2022-06-14 DIAGNOSIS — R079 Chest pain, unspecified: Secondary | ICD-10-CM | POA: Insufficient documentation

## 2022-06-14 DIAGNOSIS — E119 Type 2 diabetes mellitus without complications: Secondary | ICD-10-CM | POA: Diagnosis not present

## 2022-06-14 DIAGNOSIS — I493 Ventricular premature depolarization: Secondary | ICD-10-CM | POA: Diagnosis not present

## 2022-06-14 DIAGNOSIS — I255 Ischemic cardiomyopathy: Secondary | ICD-10-CM | POA: Insufficient documentation

## 2022-06-14 DIAGNOSIS — I110 Hypertensive heart disease with heart failure: Secondary | ICD-10-CM | POA: Diagnosis not present

## 2022-06-14 DIAGNOSIS — Z91148 Patient's other noncompliance with medication regimen for other reason: Secondary | ICD-10-CM | POA: Diagnosis not present

## 2022-06-14 DIAGNOSIS — I252 Old myocardial infarction: Secondary | ICD-10-CM | POA: Insufficient documentation

## 2022-06-14 DIAGNOSIS — R0602 Shortness of breath: Secondary | ICD-10-CM | POA: Diagnosis not present

## 2022-06-14 DIAGNOSIS — Z95810 Presence of automatic (implantable) cardiac defibrillator: Secondary | ICD-10-CM | POA: Diagnosis not present

## 2022-06-14 DIAGNOSIS — Z7982 Long term (current) use of aspirin: Secondary | ICD-10-CM | POA: Diagnosis not present

## 2022-06-14 DIAGNOSIS — I2510 Atherosclerotic heart disease of native coronary artery without angina pectoris: Secondary | ICD-10-CM | POA: Insufficient documentation

## 2022-06-14 LAB — COMPREHENSIVE METABOLIC PANEL
ALT: 11 U/L (ref 0–44)
AST: 17 U/L (ref 15–41)
Albumin: 3.8 g/dL (ref 3.5–5.0)
Alkaline Phosphatase: 69 U/L (ref 38–126)
Anion gap: 8 (ref 5–15)
BUN: 14 mg/dL (ref 8–23)
CO2: 24 mmol/L (ref 22–32)
Calcium: 8.7 mg/dL — ABNORMAL LOW (ref 8.9–10.3)
Chloride: 108 mmol/L (ref 98–111)
Creatinine, Ser: 0.91 mg/dL (ref 0.61–1.24)
GFR, Estimated: >60 mL/min (ref >60)
Glucose, Bld: 201 mg/dL — ABNORMAL HIGH (ref 70–99)
Potassium: 4.1 mmol/L (ref 3.5–5.1)
Sodium: 140 mmol/L (ref 135–145)
Total Bilirubin: 1 mg/dL (ref 0.3–1.2)
Total Protein: 6.6 g/dL (ref 6.5–8.1)

## 2022-06-14 LAB — TSH: TSH: 1.014 u[IU]/mL (ref 0.350–4.500)

## 2022-06-14 LAB — BRAIN NATRIURETIC PEPTIDE: B Natriuretic Peptide: 590.3 pg/mL — ABNORMAL HIGH (ref 0.0–100.0)

## 2022-06-14 MED ORDER — EPLERENONE 25 MG PO TABS
25.0000 mg | ORAL_TABLET | Freq: Every day | ORAL | 3 refills | Status: DC
  Filled 2022-06-14: qty 30, 30d supply, fill #0

## 2022-06-14 NOTE — Patient Instructions (Addendum)
Medication Changes: Take lasix 40 mg today, and then as needed Begin eplerenone 25 mg daily Continue holding entresto  Lab Work:  Labs done today, your results will be available in MyChart, we will contact you for abnormal readings.   A lab order has also been replaced for a recheck in two weeks. You can have this lab drawn at the medical mall in the main hospital entrance, without an appointment.  Testing/Procedures:  None  Referrals:  None  Special Instructions // Education:  Do the following things EVERYDAY: Weigh yourself in the morning before breakfast. Write it down and keep it in a log. Take your medicines as prescribed Eat low salt foods--Limit salt (sodium) to 2000 mg per day.  Stay as active as you can everyday Limit all fluids for the day to less than 2 liters   Follow-Up in:  1 month with Dr. Gala Romney    If you have any questions or concerns before your next appointment please send [place] a message through mychart or call our office at [phone] Monday-Friday 8 am-5 pm.   If you have an urgent need after hours on the weekend please call your Primary Cardiologist or the [facility] in [place] at [phone].

## 2022-06-14 NOTE — Progress Notes (Signed)
ADVANCED HF CLINIC CONSULT NOTE  Referring Physician: Kathlyn Sacramento, MD Primary Care: Patient, No Pcp Per Primary Cardiologist: Kathlyn Sacramento, MD   HPI:  [name]. is a 66 y.o. male with CAD, DM2, PVCs, former smoker, previous CVA, HTN, OSA and chronic systolic HF referred by Dr. Fletcher Anon for further evaluation of his HF.  He has known history of CAD s/p  remote MI with previous LAD,diagonal and RCA stenting at [name]. Cath 2016 showed patent LAD and diagonal stents and chronically occluded RCA and left to right collaterals.  He also has chronic systolic heart failure due to ischemic cardiomyopathy EF 25-30%  Cath 2/21 with 3v CAD with patent stents in the LAD and diagonal without significant restenosis, chronically occluded RCA stents with right to right and left-to-right collaterals and significant stenosis in the distal left circumflex supplying a relatively small OM 3 distribution.  Ejection fraction was 25 to 30%.  Right heart catheterization showed normal filling pressures, mild pulmonary hypertension and normal cardiac output.  Medical therapy was recommended.  Had NSTEMI 4/21 Cath showed occluded distal left circumflex which was treated successfully with PCI and drug-eluting stent placement.     He was admitted 6/23 with chest pain and heart failure.  Echo EF 30-35%.  Cath was done showed patent LAD and left circumflex stents with chronically occluded right coronary artery with left-to-right collaterals.  Medical therapy was recommended.  The patient did not fill his prescriptions.  He returned to the emergency room after he was stung by multiple yellow jackets and was found to be in ventricular tachycardia with a heart rate of 175 bpm.  He was cardioverted with 120 J shock.  He went back into ventricular tachycardia and was treated successfully with IV amiodarone.     He had another hospitalization in July of this year with ventricular tachycardia in the setting of not taking  amiodarone as prescribed.  He ultimately underwent ICD placement by Dr. Quentin Ore in October.  Recently saw Dr. Fletcher Anon and BP elevated in setting of not taking his meds. Carvedilol and Entresto restarted   Presents for HF consultation today. Not taking Entresto. Says he is taking carvedilol, ASA, amio and zetia. Says Entresto drags him down so he only takes once every few days. Says he is SOB with any exertion. Not taking any lasix. Denies edema, or PND. + orthopnea. No CP. Not using CPAP.     Review of Systems: [y] = yes, [ ]  = no   General: Weight gain [ ] ; Weight loss [ ] ; Anorexia [ ] ; Fatigue [ y]; Fever [ ] ; Chills [ ] ; Weakness [ ]   Cardiac: Chest pain/pressure [ ] ; Resting SOB [name]; Exertional SOB [ y]; Orthopnea [ y]; Pedal Edema [ ] ; Palpitations [ ] ; Syncope [ ] ; Presyncope [ ] ; Paroxysmal nocturnal dyspnea[ ]   Pulmonary: Cough [ ] ; Wheezing[ ] ; Hemoptysis[ ] ; Sputum [ ] ; Snoring [name]  GI: Vomiting[ ] ; Dysphagia[ ] ; Melena[ ] ; Hematochezia [ ] ; Heartburn[ ] ; Abdominal pain [ ] ; Constipation [ ] ; Diarrhea [ ] ; BRBPR [ ]   GU: Hematuria[ ] ; Dysuria [ ] ; Nocturia[ ]   Vascular: Pain in legs with walking [ ] ; Pain in feet with lying flat [ ] ; Non-healing sores [ ] ; Stroke [ ] ; TIA [ ] ; Slurred speech [ ] ;  Neuro: Headaches[ ] ; Vertigo[ ] ; Seizures[ ] ; Paresthesias[ ] ;Blurred vision [ ] ; Diplopia [ ] ; Vision changes [ ]   Ortho/Skin: Arthritis [ y]; Joint pain [ y]; Muscle pain [ ] ;  Joint swelling [ ] ; Back Pain [ ] ; Rash [ ]   Psych: Depression[ y]; Anxiety[ ]   Heme: Bleeding problems [ ] ; Clotting disorders [ ] ; Anemia [ ]   Endocrine: Diabetes [ ] ; Thyroid dysfunction[ ]    Past Medical History:  Diagnosis Date   Anginal pain ([name])    Arthritis    CAD (coronary artery disease)    a. s/p MI with LAD and Diag stenting @ [name];  b. 06/2015 Cath: LAD 92m/d ISR, 100 RCA (ISR) w/ L->R collats, otw mod nonobs dzs-->Med Rx; c. 08/2019 Cath: LM nl, LAD 10p/m ISR, D1 20, D2 100, RI min irregs, LCX  32m/d, OM1 100, OM2 50, RCA 100p, 70d. RPDA fills via collats from LAD. EF 25-35%-->Med Rx; d. 10/2019 NSTEMI/Cath: LCX now 100 (2.75x15 Resolute Onyx DES), otw stable compared to 08/2019.   Chronic combined systolic and diastolic CHF (congestive heart failure) ([name])    a. 06/2015 Echo: EF 20-25%, Gr3 DD; b. 10/2019 Echo: EF 25-30%, glob HK, sev inf/infapical HK. Mod dil LA.   Dyspnea    Essential hypertension    Headache 12/23/2021   Hyperlipidemia    Hypokalemia    a. 06/2015 in setting of diuresis.   Ischemic cardiomyopathy    a. 2011 EF 45% ([name]);  b. 06/2015 Echo: EF 20-25%; c. 04/2019 Echo: EF 40-45%; d. 08/2019 LV gram: EF 25-35%; e. 10/2019 Echo: EF 25-30%.   PVC's (premature ventricular contractions)    a. 09/2019 Zio (3 days): Avg hr 70, 4 runs NSVT, 5 runs SVT, rare PACs, frequent PVCs w/ 11.8% burden.   Sleep apnea    Stroke [name]    Stroke/Right temporal lobe infarction [name]    a. 10/2019 MRI brain: 1cm acute ischemic nonhemorrhagic R temporal lobe infarct. Age-related cerebral atrophy w/ moderate chronic small vessel ischemic dzs.   Type 2 diabetes mellitus with hyperglycemia ([name]) 10/08/2019    Current Outpatient Medications  Medication Sig Dispense Refill   acetaminophen (TYLENOL) 500 MG tablet Take 1,000 mg by mouth as needed.     amiodarone (PACERONE) 200 MG tablet Take 1 tablet (200 mg total) by mouth daily. 90 tablet 1   aspirin EC 81 MG tablet Take 1 tablet (81 mg total) by mouth daily. Swallow whole. 90 tablet 3   carvedilol (COREG) 3.125 MG tablet Take 1 tablet (3.125 mg total) by mouth 2 (two) times daily with a meal. 180 tablet 3   ezetimibe (ZETIA) 10 MG tablet Take 1 tablet (10 mg total) by mouth daily. 90 tablet 3   furosemide (LASIX) 40 MG tablet Take 1 tablet (40 mg total) by mouth once daily as needed. 90 tablet 3   ibuprofen (ADVIL) 200 MG tablet Take by mouth as needed.     sodium chloride (OCEAN) 0.65 % SOLN nasal spray Place 1 spray into both nostrils as  needed for congestion.     nitroGLYCERIN (NITROSTAT) 0.4 MG SL tablet Place 1 tablet (0.4 mg total) under the tongue every 5 (five) minutes as needed for chest pain. (Patient not taking: Reported on 06/01/2022) 90 tablet 3   sacubitril-valsartan (ENTRESTO) 49-51 MG Take 1 tablet by mouth 2 (two) times daily. (Patient not taking: Reported on 06/14/2022) 180 tablet 3   No current facility-administered medications for this visit.    Allergies  Allergen Reactions   Contrast Media [Iodinated Contrast Media] Shortness Of Breath   Iohexol Shortness Of Breath     Onset Date: [id number]    Jardiance [Empagliflozin] Other (See Comments)  Fatigue/weakness   Atorvastatin Other (See Comments)    Myalgias    Hydrocodone Itching   Morphine And Related Other (See Comments)    Lost control    Penicillins Other (See Comments)    Unknown reaction Did it involve swelling of the face/tongue/throat, SOB, or low BP? Unknown Did it involve sudden or severe rash/hives, skin peeling, or any reaction on the inside of your mouth or nose? Unknown Did you need to seek medical attention at a hospital or doctor's office? Yes When did it last happen?      Childhood allergy  If all above answers are "NO", may proceed with cephalosporin use.    Rosuvastatin Other (See Comments)    Myalgias       Social History   Socioeconomic History   Marital status: Widowed    Spouse name: Not on file   Number of children: 2   Years of education: Not on file   Highest education level: GED or equivalent  Occupational History   Occupation: Statistician   Tobacco Use   Smoking status: Former    Types: Cigarettes    Quit date: 06/10/2011    Years since quitting: 11.0   Smokeless tobacco: Never  Substance and Sexual Activity   Alcohol use: No    Alcohol/week: 0.0 standard drinks of alcohol   Drug use: No   Sexual activity: Not Currently  Other Topics Concern   Not on file  Social History Narrative   Lives alone.    Social Determinants of Health   Financial Resource Strain: Low Risk  (04/30/2019)   Overall Financial Resource Strain (CARDIA)    Difficulty of Paying Living Expenses: Not hard at all  Food Insecurity: No Food Insecurity (04/30/2019)   Hunger Vital Sign    Worried About Running Out of Food in the Last Year: Never true    Ran Out of Food in the Last Year: Never true  Transportation Needs: No Transportation Needs (04/30/2019)   PRAPARE - Administrator, Civil Service (Medical): No    Lack of Transportation (Non-Medical): No  Physical Activity: Inactive (04/30/2019)   Exercise Vital Sign    Days of Exercise per Week: 0 days    Minutes of Exercise per Session: 0 min  Stress: No Stress Concern Present (04/30/2019)   [employer] of Occupational Health - Occupational Stress Questionnaire    Feeling of Stress : Not at all  Social Connections: Moderately Isolated (04/30/2019)   Social Connection and Isolation Panel [NHANES]    Frequency of Communication with Friends and Family: More than three times a week    Frequency of Social Gatherings with Friends and Family: More than three times a week    Attends Religious Services: Never    Database administrator or Organizations: Yes    Attends Banker Meetings: Never    Marital Status: Divorced  Catering manager Violence: Not At Risk (04/30/2019)   Humiliation, Afraid, Rape, and Kick questionnaire    Fear of Current or Ex-Partner: No    Emotionally Abused: No    Physically Abused: No    Sexually Abused: No      Family History  Problem Relation Age of Onset   Coronary artery disease Mother    Diabetes Mother    Coronary artery disease Father     Vitals:   06/14/22 1123  BP: (!) 144/78  Pulse: 67  SpO2: 98%  Weight: 206 lb 8 oz (93.7 kg)  PHYSICAL EXAM: General:  Elderly appearing  No respiratory difficulty HEENT: normal Neck: supple. JVP 7-8. Carotids 2+ bilat; no bruits. No lymphadenopathy or  thryomegaly appreciated. Cor: PMI nondisplaced. Regular rate & rhythm. No rubs, gallops or murmurs. Lungs: decreased throughout Abdomen: obese  soft, nontender, nondistended. No hepatosplenomegaly. No bruits or masses. Good bowel sounds. Extremities: no cyanosis, clubbing, rash, edema Neuro: alert & oriented x 3, cranial nerves grossly intact. moves all 4 extremities w/o difficulty. Affect pleasant.    ASSESSMENT & PLAN:  1.  Chronic systolic heart failure due to iCM - Echo 6/23 EF 30-35%.   - therapy has been complicated by medication noncompliance.  - s/p MDT ICD - NYHA III-IIIB - Volume status mildly elevated. Not taking lasix  - Take lasix 40 mg today and prn for weight gain  - Continue carvedilol - Start eplerenone 25 - Unable to tolerate Entresto due to fatigue. Start losartan 25 qhs at next visit  - He did not tolerate Jardiance in the past due to fatigue and weakness.  He does not want to try another SGLT2 inhibitor. - ICD interrogation in clinic today. No VT/AF. Fluid on rise but still below threshold. Activity level 3hr/day  - Long talk about importance of volume management and GDMT - Will have him f/u in HF in 2 weeks and me in one month with goal to get him on 3 of 4 GDMT meds - Labs today and 2 weeks    2. CAD  - s/p remote MI. S/p PCI LAD, LCx and RCA - Cath 6/23: patent LAD and left circumflex stents with chronically occluded right coronary artery with left-to-right collaterals.  Medical therapy was recommended. - No s/s angina  - Continue DAPT. He doesn't tolerate statin - Follows with Dr. Fletcher Anon   3  Essential hypertension:  - BP up.  - Med changes as above   4. H/o ventricular tachycardia: - Status post ICD placement.   - continue amio  - check amio labs as above  5. H/o PVCs - Zio 3/21: Avg hr 70, 4 runs NSVT, 5 runs SVT, rare PACs, frequent PVCs w/ 11.8% burden. - consider repeat  Total time spent 45 minutes. Over half that time spent discussing  above.    Glori Bickers, MD  11:33 AM

## 2022-06-15 ENCOUNTER — Other Ambulatory Visit: Payer: Self-pay

## 2022-06-15 ENCOUNTER — Ambulatory Visit: Payer: Medicaid Other | Admitting: Family

## 2022-06-15 LAB — T4: T4, Total: 10.1 ug/dL (ref 4.5–12.0)

## 2022-06-15 LAB — T3: T3, Total: 92 ng/dL (ref 71–180)

## 2022-06-24 ENCOUNTER — Other Ambulatory Visit: Payer: Self-pay

## 2022-06-24 MED ORDER — METHOCARBAMOL 500 MG PO TABS
500.0000 mg | ORAL_TABLET | Freq: Three times a day (TID) | ORAL | 0 refills | Status: DC | PRN
  Filled 2022-06-24: qty 90, 30d supply, fill #0

## 2022-06-29 ENCOUNTER — Other Ambulatory Visit
Admission: RE | Admit: 2022-06-29 | Discharge: 2022-06-29 | Disposition: A | Discharge status: Home / Self Care | Payer: Medicaid Other | Attending: Internal Medicine | Admitting: Internal Medicine

## 2022-06-29 DIAGNOSIS — I5020 Unspecified systolic (congestive) heart failure: Secondary | ICD-10-CM | POA: Diagnosis present

## 2022-06-29 LAB — BASIC METABOLIC PANEL
Anion gap: 9 (ref 5–15)
BUN: 19 mg/dL (ref 8–23)
CO2: 29 mmol/L (ref 22–32)
Calcium: 9.1 mg/dL (ref 8.9–10.3)
Chloride: 103 mmol/L (ref 98–111)
Creatinine, Ser: 1.09 mg/dL (ref 0.61–1.24)
GFR, Estimated: >60 mL/min (ref >60)
Glucose, Bld: 207 mg/dL — ABNORMAL HIGH (ref 70–99)
Potassium: 3.5 mmol/L (ref 3.5–5.1)
Sodium: 141 mmol/L (ref 135–145)

## 2022-07-06 ENCOUNTER — Telehealth: Payer: Self-pay

## 2022-07-06 NOTE — Telephone Encounter (Signed)
Spoke to patient. He reports increased SOB at night and with exertion and chest and throat congestion x4-5w He wakes in a panic, as he feels that he can not breath and gasping for air, Denied f/c/s, wheezing or additional sx. He is using Afrin 5-6x daily x1w. Not inhaler. He ran out of albuiterol and did not refill. He would like a refill. Appt scheduled 08/09/2022.  Dr. Patsey Berthold, please advise. Thanks

## 2022-07-06 NOTE — Telephone Encounter (Signed)
Patient is aware of recommendations and voiced his understanding.  He will contact cardiology.  Nothing further needed.

## 2022-07-06 NOTE — Telephone Encounter (Signed)
Afrin use is excessive and may be causing worsening symptoms.  I recommend he just get Nasonex over-the-counter and use 1 spray twice a day.  The symptoms he is having may be related to his heart and he needs to check with his cardiologist.  His heart has been noted to be weak previously.

## 2022-07-12 ENCOUNTER — Ambulatory Visit: Payer: Medicaid Other | Attending: Otolaryngology

## 2022-07-12 DIAGNOSIS — I5020 Unspecified systolic (congestive) heart failure: Secondary | ICD-10-CM | POA: Insufficient documentation

## 2022-07-13 ENCOUNTER — Ambulatory Visit: Payer: Medicaid Other | Attending: Internal Medicine | Admitting: Internal Medicine

## 2022-07-13 ENCOUNTER — Encounter: Payer: Self-pay | Admitting: Internal Medicine

## 2022-07-13 ENCOUNTER — Other Ambulatory Visit: Payer: Self-pay

## 2022-07-13 VITALS — BP 152/96 | HR 61 | Resp 18 | Wt 200.2 lb

## 2022-07-13 DIAGNOSIS — F419 Anxiety disorder, unspecified: Secondary | ICD-10-CM | POA: Diagnosis not present

## 2022-07-13 DIAGNOSIS — I252 Old myocardial infarction: Secondary | ICD-10-CM | POA: Diagnosis not present

## 2022-07-13 DIAGNOSIS — I272 Pulmonary hypertension, unspecified: Secondary | ICD-10-CM | POA: Insufficient documentation

## 2022-07-13 DIAGNOSIS — Z955 Presence of coronary angioplasty implant and graft: Secondary | ICD-10-CM | POA: Insufficient documentation

## 2022-07-13 DIAGNOSIS — E119 Type 2 diabetes mellitus without complications: Secondary | ICD-10-CM | POA: Insufficient documentation

## 2022-07-13 DIAGNOSIS — I493 Ventricular premature depolarization: Secondary | ICD-10-CM | POA: Insufficient documentation

## 2022-07-13 DIAGNOSIS — I2510 Atherosclerotic heart disease of native coronary artery without angina pectoris: Secondary | ICD-10-CM | POA: Insufficient documentation

## 2022-07-13 DIAGNOSIS — I110 Hypertensive heart disease with heart failure: Secondary | ICD-10-CM | POA: Diagnosis present

## 2022-07-13 DIAGNOSIS — Z8673 Personal history of transient ischemic attack (TIA), and cerebral infarction without residual deficits: Secondary | ICD-10-CM | POA: Diagnosis not present

## 2022-07-13 DIAGNOSIS — I255 Ischemic cardiomyopathy: Secondary | ICD-10-CM | POA: Insufficient documentation

## 2022-07-13 DIAGNOSIS — Z79899 Other long term (current) drug therapy: Secondary | ICD-10-CM | POA: Insufficient documentation

## 2022-07-13 DIAGNOSIS — Z87891 Personal history of nicotine dependence: Secondary | ICD-10-CM | POA: Diagnosis not present

## 2022-07-13 DIAGNOSIS — I5020 Unspecified systolic (congestive) heart failure: Secondary | ICD-10-CM

## 2022-07-13 DIAGNOSIS — Z4502 Encounter for adjustment and management of automatic implantable cardiac defibrillator: Secondary | ICD-10-CM | POA: Insufficient documentation

## 2022-07-13 DIAGNOSIS — I10 Essential (primary) hypertension: Secondary | ICD-10-CM | POA: Diagnosis not present

## 2022-07-13 DIAGNOSIS — I472 Ventricular tachycardia, unspecified: Secondary | ICD-10-CM | POA: Insufficient documentation

## 2022-07-13 DIAGNOSIS — G4733 Obstructive sleep apnea (adult) (pediatric): Secondary | ICD-10-CM | POA: Diagnosis not present

## 2022-07-13 LAB — BASIC METABOLIC PANEL
Anion gap: 12 (ref 5–15)
BUN: 9 mg/dL (ref 8–23)
CO2: 25 mmol/L (ref 22–32)
Calcium: 8.6 mg/dL — ABNORMAL LOW (ref 8.9–10.3)
Chloride: 103 mmol/L (ref 98–111)
Creatinine, Ser: 0.73 mg/dL (ref 0.61–1.24)
GFR, Estimated: >60 mL/min (ref >60)
Glucose, Bld: 128 mg/dL — ABNORMAL HIGH (ref 70–99)
Potassium: 2.9 mmol/L — ABNORMAL LOW (ref 3.5–5.1)
Sodium: 140 mmol/L (ref 135–145)

## 2022-07-13 LAB — BRAIN NATRIURETIC PEPTIDE: B Natriuretic Peptide: 371.2 pg/mL — ABNORMAL HIGH (ref 0.0–100.0)

## 2022-07-13 MED ORDER — SERTRALINE HCL 50 MG PO TABS
ORAL_TABLET | ORAL | 0 refills | Status: DC
  Filled 2022-07-13: qty 87, 90d supply, fill #0

## 2022-07-13 MED ORDER — LOSARTAN POTASSIUM 50 MG PO TABS
50.0000 mg | ORAL_TABLET | Freq: Every day | ORAL | 3 refills | Status: DC
  Filled 2022-07-13: qty 30, 30d supply, fill #0

## 2022-07-13 MED ORDER — FUROSEMIDE 40 MG PO TABS
40.0000 mg | ORAL_TABLET | Freq: Every day | ORAL | 3 refills | Status: DC
  Filled 2022-07-13: qty 30, 30d supply, fill #0

## 2022-07-13 NOTE — Progress Notes (Signed)
ADVANCED HF CLINIC NOTE  Referring Physician: Kathlyn Sacramento, MD Primary Care: Patient, No Pcp Per Primary Cardiologist: Kathlyn Sacramento, MD   HPI:  [name]. is a 67 y.o. male with CAD, DM2, PVCs, former smoker, previous CVA, HTN, OSA and chronic systolic HF referred by Dr. Fletcher Anon for further evaluation of his HF.  He has known history of CAD s/p  remote MI with previous LAD,diagonal and RCA stenting at [facility]. Cath 2016 showed patent LAD and diagonal stents and chronically occluded RCA and left to right collaterals.  He also has chronic systolic heart failure due to ischemic cardiomyopathy EF 25-30%  Cath 2/21 with 3v CAD with patent stents in the LAD and diagonal without significant restenosis, chronically occluded RCA stents with right to right and left-to-right collaterals and significant stenosis in the distal left circumflex supplying a relatively small OM 3 distribution.  Ejection fraction was 25 to 30%.  Right heart catheterization showed normal filling pressures, mild pulmonary hypertension and normal cardiac output.  Medical therapy was recommended.  Had NSTEMI 4/21 Cath showed occluded distal left circumflex which was treated successfully with PCI and drug-eluting stent placement.     He was admitted 6/23 with chest pain and heart failure.  Echo EF 30-35%.  Cath was done showed patent LAD and left circumflex stents with chronically occluded right coronary artery with left-to-right collaterals.  Medical therapy was recommended.  The patient did not fill his prescriptions.  He returned to the emergency room after he was stung by multiple yellow jackets and was found to be in ventricular tachycardia with a heart rate of 175 bpm.  He was cardioverted with 120 J shock.  He went back into ventricular tachycardia and was treated successfully with IV amiodarone.     Admitted 7/23 with VTin the setting of not taking amiodarone as prescribed.  He ultimately underwent ICD placement by Dr.  Quentin Ore in October.  I saw him for the first time in 12/23. He was not taking Entresto regularly because it dragged him down. Stopped entresto and added eplerenone  Here for f/u. Not taking eplerenone because he says it made him feel anxious and he couldn't sleep.Says he is doing ok. Says he gets SOB at night gets better with Afrin and Nyquil. Also says he feels panic at night and can't breathe and is gasping for air. Goes outside and feels better. SOB when he first starts walking but then goes away. No CP. Taking lasix every other day.   Had sleep study last night.    Past Medical History:  Diagnosis Date   Anginal pain ([place])    Arthritis    CAD (coronary artery disease)    a. s/p MI with LAD and Diag stenting @ [name];  b. 06/2015 Cath: LAD 14m/d ISR, 100 RCA (ISR) w/ L->R collats, otw mod nonobs dzs-->Med Rx; c. 08/2019 Cath: LM nl, LAD 10p/m ISR, D1 20, D2 100, RI min irregs, LCX 95m/d, OM1 100, OM2 50, RCA 100p, 70d. RPDA fills via collats from LAD. EF 25-35%-->Med Rx; d. 10/2019 NSTEMI/Cath: LCX now 100 (2.75x15 Resolute Onyx DES), otw stable compared to 08/2019.   Chronic combined systolic and diastolic CHF (congestive heart failure) ([place])    a. 06/2015 Echo: EF 20-25%, Gr3 DD; b. 10/2019 Echo: EF 25-30%, glob HK, sev inf/infapical HK. Mod dil LA.   Dyspnea    Essential hypertension    Headache 12/23/2021   Hyperlipidemia    Hypokalemia    a. 06/2015 in  setting of diuresis.   Ischemic cardiomyopathy    a. 2011 EF 45% ([name]);  b. 06/2015 Echo: EF 20-25%; c. 04/2019 Echo: EF 40-45%; d. 08/2019 LV gram: EF 25-35%; e. 10/2019 Echo: EF 25-30%.   PVC's (premature ventricular contractions)    a. 09/2019 Zio (3 days): Avg hr 70, 4 runs NSVT, 5 runs SVT, rare PACs, frequent PVCs w/ 11.8% burden.   Sleep apnea    Stroke [facility])    Stroke/Right temporal lobe infarction [facility])    a. 10/2019 MRI brain: 1cm acute ischemic nonhemorrhagic R temporal lobe infarct. Age-related cerebral atrophy w/ moderate  chronic small vessel ischemic dzs.   Type 2 diabetes mellitus with hyperglycemia ([facility]) 10/08/2019    Current Outpatient Medications  Medication Sig Dispense Refill   acetaminophen (TYLENOL) 500 MG tablet Take 1,000 mg by mouth as needed.     amiodarone (PACERONE) 200 MG tablet Take 1 tablet (200 mg total) by mouth daily. 90 tablet 1   aspirin EC 81 MG tablet Take 1 tablet (81 mg total) by mouth daily. Swallow whole. 90 tablet 3   carvedilol (COREG) 3.125 MG tablet Take 1 tablet (3.125 mg total) by mouth 2 (two) times daily with a meal. 180 tablet 3   eplerenone (INSPRA) 25 MG tablet Take 1 tablet (25 mg total) by mouth daily. 30 tablet 3   ezetimibe (ZETIA) 10 MG tablet Take 1 tablet (10 mg total) by mouth daily. 90 tablet 3   furosemide (LASIX) 40 MG tablet Take 1 tablet (40 mg total) by mouth once daily as needed. 90 tablet 3   ibuprofen (ADVIL) 200 MG tablet Take by mouth as needed.     methocarbamol (ROBAXIN) 500 MG tablet Take 1 tablet (500 mg total) by mouth 3 (three) times daily as needed. 90 tablet 0   nitroGLYCERIN (NITROSTAT) 0.4 MG SL tablet Place 1 tablet (0.4 mg total) under the tongue every 5 (five) minutes as needed for chest pain. 90 tablet 3   sodium chloride (OCEAN) 0.65 % SOLN nasal spray Place 1 spray into both nostrils as needed for congestion.     No current facility-administered medications for this visit.    Allergies  Allergen Reactions   Contrast Media [Iodinated Contrast Media] Shortness Of Breath   Iohexol Shortness Of Breath     Onset Date: [id number]    Jardiance [Empagliflozin] Other (See Comments)    Fatigue/weakness   Atorvastatin Other (See Comments)    Myalgias    Hydrocodone Itching   Morphine And Related Other (See Comments)    Lost control    Penicillins Other (See Comments)    Unknown reaction Did it involve swelling of the face/tongue/throat, SOB, or low BP? Unknown Did it involve sudden or severe rash/hives, skin peeling, or any reaction on  the inside of your mouth or nose? Unknown Did you need to seek medical attention at a hospital or doctor's office? Yes When did it last happen?      Childhood allergy  If all above answers are "NO", may proceed with cephalosporin use.    Rosuvastatin Other (See Comments)    Myalgias       Social History   Socioeconomic History   Marital status: Widowed    Spouse name: Not on file   Number of children: 2   Years of education: Not on file   Highest education level: GED or equivalent  Occupational History   Occupation: Statistician   Tobacco Use   Smoking status: Former  Types: Cigarettes    Quit date: 06/10/2011    Years since quitting: 11.0   Smokeless tobacco: Never  Substance and Sexual Activity   Alcohol use: No    Alcohol/week: 0.0 standard drinks of alcohol   Drug use: No   Sexual activity: Not Currently  Other Topics Concern   Not on file  Social History Narrative   Lives alone.   Social Determinants of Health   Financial Resource Strain: Low Risk  (04/30/2019)   Overall Financial Resource Strain (CARDIA)    Difficulty of Paying Living Expenses: Not hard at all  Food Insecurity: No Food Insecurity (04/30/2019)   Hunger Vital Sign    Worried About Running Out of Food in the Last Year: Never true    Ran Out of Food in the Last Year: Never true  Transportation Needs: No Transportation Needs (04/30/2019)   PRAPARE - Administrator, Civil Service (Medical): No    Lack of Transportation (Non-Medical): No  Physical Activity: Inactive (04/30/2019)   Exercise Vital Sign    Days of Exercise per Week: 0 days    Minutes of Exercise per Session: 0 min  Stress: No Stress Concern Present (04/30/2019)   [employer] of Occupational Health - Occupational Stress Questionnaire    Feeling of Stress : Not at all  Social Connections: Moderately Isolated (04/30/2019)   Social Connection and Isolation Panel [NHANES]    Frequency of Communication with Friends  and Family: More than three times a week    Frequency of Social Gatherings with Friends and Family: More than three times a week    Attends Religious Services: Never    Database administrator or Organizations: Yes    Attends Banker Meetings: Never    Marital Status: Divorced  Catering manager Violence: Not At Risk (04/30/2019)   Humiliation, Afraid, Rape, and Kick questionnaire    Fear of Current or Ex-Partner: No    Emotionally Abused: No    Physically Abused: No    Sexually Abused: No      Family History  Problem Relation Age of Onset   Coronary artery disease Mother    Diabetes Mother    Coronary artery disease Father     Vitals:   07/13/22 1113  BP: (!) 152/96  Pulse: 61  Resp: 18  SpO2: 98%  Weight: 200 lb 4 oz (90.8 kg)    PHYSICAL EXAM: General:  Well appearing. No resp difficulty HEENT: normal Neck: supple. no JVD. Carotids 2+ bilat; no bruits. No lymphadenopathy or thryomegaly appreciated. Cor: PMI nondisplaced. Regular rate & rhythm. No rubs, gallops or murmurs. Lungs: clear with decreased breath sounds throughout Abdomen: soft, nontender, nondistended. No hepatosplenomegaly. No bruits or masses. Good bowel sounds. Extremities: no cyanosis, clubbing, rash, edema Neuro: alert & orientedx3, cranial nerves grossly intact. moves all 4 extremities w/o difficulty. Affect pleasant  ICD interrogation: No VT/AF. Fluid up Activity 2-3 hr/day   ASSESSMENT & PLAN:  1.  Chronic systolic heart failure due to iCM - Echo 6/23 EF 30-35%.   - s/p MDT ICD - Stable NYHA III - Volume status mildly elevated. Encouraged him to take lasi 40 daily - Take lasix 40 mg today and prn for weight gain  - Continue carvedilol - Unable to tolerate eplerenone due to anxiety. Will hold for now - Unable to tolerate Entresto due to fatigue. Start losartan 50 daily - He did not tolerate Jardiance in the past due to fatigue and weakness.  He  does not want to try another SGLT2  inhibitor. - ICD interrogation in clinic today. Results as above - Medical management limited by multiple drug intolerances. We discussed the impact that could have on his HF prognosis and symptoms and goal to get him on at least 3/4 GDMT meds    2. CAD  - s/p remote MI. S/p PCI LAD, LCx and RCA - Cath 6/23: patent LAD and left circumflex stents with chronically occluded right coronary artery with left-to-right collaterals.  Medical therapy was recommended. - No s.s angina - Continue DAPT. He doesn't tolerate statin - Follows with Dr. Kirke Corin   3  Essential hypertension:  - BP up.  - Med changes as above   4. H/o ventricular tachycardia: - Status post ICD placement.   - continue amio  - check amio labs as above  5. H/o PVCs - Zio 3/21: Avg hr 70, 4 runs NSVT, 5 runs SVT, rare PACs, frequent PVCs w/ 11.8% burden. - consider repeat  6. Anxiety - suspect this is a big part of his symptoms - will start sertraline 25 daily. Increase to 50 in 1 week    Arvilla Meres, MD  11:35 AM

## 2022-07-13 NOTE — Patient Instructions (Addendum)
Medication Changes:  STOP eplerone START lasix 40 mg DAILY START Losartan 50 mg daily  START Sertraline 50 mg daily. You will take half a tablet for the 7 days (25 mg) then increase to 50 mg daily  Lab Work:  Labs done today, your results will be available in MyChart, we will contact you for abnormal readings.   Testing/Procedures:  None  Referrals:  Referral placed for Dr. Derrel Nip with [facility] for a primary care doctor. Their office should contact you to schedule an appointment.   Special Instructions // Education:  Do the following things EVERYDAY: Weigh yourself in the morning before breakfast. Write it down and keep it in a log. Take your medicines as prescribed Eat low salt foods--Limit salt (sodium) to 2000 mg per day.  Stay as active as you can everyday Limit all fluids for the day to less than 2 liters   Follow-Up in: 1 month with Bensimhon    If you have any questions or concerns before your next appointment please send [place] a message through mychart or call our office at [phone] Monday-Friday 8 am-5 pm.   If you have an urgent need after hours on the weekend please call your Primary Cardiologist or the [facility] in [place] at [phone].

## 2022-07-14 ENCOUNTER — Ambulatory Visit: Payer: Medicaid Other | Attending: Cardiology | Admitting: Cardiology

## 2022-07-14 ENCOUNTER — Encounter: Payer: Self-pay | Admitting: Cardiology

## 2022-07-14 VITALS — BP 142/82 | Ht 66.0 in | Wt 200.0 lb

## 2022-07-14 DIAGNOSIS — Z79899 Other long term (current) drug therapy: Secondary | ICD-10-CM | POA: Diagnosis not present

## 2022-07-14 DIAGNOSIS — Z95810 Presence of automatic (implantable) cardiac defibrillator: Secondary | ICD-10-CM

## 2022-07-14 DIAGNOSIS — I5020 Unspecified systolic (congestive) heart failure: Secondary | ICD-10-CM | POA: Diagnosis not present

## 2022-07-14 DIAGNOSIS — I472 Ventricular tachycardia, unspecified: Secondary | ICD-10-CM | POA: Diagnosis not present

## 2022-07-14 NOTE — Progress Notes (Signed)
Electrophysiology Office Follow up Visit Note:    Date:  07/14/2022   ID:  [name]., DOB Feb 09, 1956, MRN [id number]  PCP:  Patient, No Pcp Per  [facility] Cardiologist:  Lorine Bears, MD  [facility] Electrophysiologist:  Lanier Prude, MD    Interval History:     [name]. is a 67 y.o. male who presents for a follow up visit. They were last seen in clinic March 17, 2022 for history of ventricular tachycardia.  He is on amiodarone.  An ICD was implanted April 07, 2022. He has been doing well since his ICD was implanted.  He is back doing some work.  His back was giving him a lot of trouble but now it is on the mend.      Past Medical History:  Diagnosis Date   Anginal pain (HCC)    Arthritis    CAD (coronary artery disease)    a. s/p MI with LAD and Diag stenting @ [name];  b. 06/2015 Cath: LAD 24m/d ISR, 100 RCA (ISR) w/ L->R collats, otw mod nonobs dzs-->Med Rx; c. 08/2019 Cath: LM nl, LAD 10p/m ISR, D1 20, D2 100, RI min irregs, LCX 7m/d, OM1 100, OM2 50, RCA 100p, 70d. RPDA fills via collats from LAD. EF 25-35%-->Med Rx; d. 10/2019 NSTEMI/Cath: LCX now 100 (2.75x15 Resolute Onyx DES), otw stable compared to 08/2019.   Chronic combined systolic and diastolic CHF (congestive heart failure) (HCC)    a. 06/2015 Echo: EF 20-25%, Gr3 DD; b. 10/2019 Echo: EF 25-30%, glob HK, sev inf/infapical HK. Mod dil LA.   Dyspnea    Essential hypertension    Headache 12/23/2021   Hyperlipidemia    Hypokalemia    a. 06/2015 in setting of diuresis.   Ischemic cardiomyopathy    a. 2011 EF 45% ([name]);  b. 06/2015 Echo: EF 20-25%; c. 04/2019 Echo: EF 40-45%; d. 08/2019 LV gram: EF 25-35%; e. 10/2019 Echo: EF 25-30%.   PVC's (premature ventricular contractions)    a. 09/2019 Zio (3 days): Avg hr 70, 4 runs NSVT, 5 runs SVT, rare PACs, frequent PVCs w/ 11.8% burden.   Sleep apnea    Stroke [facility])    Stroke/Right temporal lobe infarction [facility])    a. 10/2019 MRI brain: 1cm  acute ischemic nonhemorrhagic R temporal lobe infarct. Age-related cerebral atrophy w/ moderate chronic small vessel ischemic dzs.   Type 2 diabetes mellitus with hyperglycemia (HCC) 10/08/2019    Past Surgical History:  Procedure Laterality Date   BACK SURGERY  04/2019   CARDIAC CATHETERIZATION N/A 06/10/2015   Procedure: Left Heart Cath;  Surgeon: Iran Ouch, MD;  Location: [facility] INVASIVE CV LAB;  Service: Cardiovascular;  Laterality: N/A;   CORONARY STENT INTERVENTION N/A 10/08/2019   Procedure: CORONARY STENT INTERVENTION;  Surgeon: Iran Ouch, MD;  Location: [facility] INVASIVE CV LAB;  Service: Cardiovascular;  Laterality: N/A;   CORONARY STENT PLACEMENT     ICD IMPLANT N/A 04/07/2022   Procedure: ICD IMPLANT;  Surgeon: Lanier Prude, MD;  Location: [facility] INVASIVE CV LAB;  Service: Cardiovascular;  Laterality: N/A;   LEFT HEART CATH AND CORONARY ANGIOGRAPHY N/A 10/08/2019   Procedure: LEFT HEART CATH AND CORONARY ANGIOGRAPHY poss pci;  Surgeon: Iran Ouch, MD;  Location: [facility] INVASIVE CV LAB;  Service: Cardiovascular;  Laterality: N/A;   RIGHT HEART CATH AND CORONARY ANGIOGRAPHY N/A 12/30/2021   Procedure: RIGHT HEART CATH AND CORONARY ANGIOGRAPHY;  Surgeon: Yvonne Kendall, MD;  Location: [facility]  INVASIVE CV LAB;  Service: Cardiovascular;  Laterality: N/A;   RIGHT/LEFT HEART CATH AND CORONARY ANGIOGRAPHY N/A 08/20/2019   Procedure: RIGHT/LEFT HEART CATH AND CORONARY ANGIOGRAPHY;  Surgeon: Wellington Hampshire, MD;  Location: [facility] CV LAB;  Service: Cardiovascular;  Laterality: N/A;    Current Medications: Current Meds  Medication Sig   acetaminophen (TYLENOL) 500 MG tablet Take 1,000 mg by mouth as needed.   amiodarone (PACERONE) 200 MG tablet Take 1 tablet (200 mg total) by mouth daily.   aspirin EC 81 MG tablet Take 1 tablet (81 mg total) by mouth daily. Swallow whole.   carvedilol (COREG) 3.125 MG tablet Take 1 tablet (3.125 mg total) by mouth 2 (two) times daily  with a meal.   ezetimibe (ZETIA) 10 MG tablet Take 1 tablet (10 mg total) by mouth daily.   furosemide (LASIX) 40 MG tablet Take 1 tablet (40 mg total) by mouth daily.   ibuprofen (ADVIL) 200 MG tablet Take by mouth as needed.   losartan (COZAAR) 50 MG tablet Take 1 tablet (50 mg total) by mouth daily.   methocarbamol (ROBAXIN) 500 MG tablet Take 1 tablet (500 mg total) by mouth 3 (three) times daily as needed.   nitroGLYCERIN (NITROSTAT) 0.4 MG SL tablet Place 1 tablet (0.4 mg total) under the tongue every 5 (five) minutes as needed for chest pain.   sertraline (ZOLOFT) 50 MG tablet Take 0.5 tablets (25 mg total) by mouth daily for 7 days, THEN 1 tablet (50 mg total) daily.   sodium chloride (OCEAN) 0.65 % SOLN nasal spray Place 1 spray into both nostrils as needed for congestion.     Allergies:   Contrast media [iodinated contrast media], Iohexol, Jardiance [empagliflozin], Atorvastatin, Hydrocodone, Morphine and related, Penicillins, and Rosuvastatin   Social History   Socioeconomic History   Marital status: Widowed    Spouse name: Not on file   Number of children: 2   Years of education: Not on file   Highest education level: GED or equivalent  Occupational History   Occupation: Psychiatrist   Tobacco Use   Smoking status: Former    Types: Cigarettes    Quit date: 06/10/2011    Years since quitting: 11.1   Smokeless tobacco: Never  Substance and Sexual Activity   Alcohol use: No    Alcohol/week: 0.0 standard drinks of alcohol   Drug use: No   Sexual activity: Not Currently  Other Topics Concern   Not on file  Social History Narrative   Lives alone.   Social Determinants of Health   Financial Resource Strain: Low Risk  (04/30/2019)   Overall Financial Resource Strain (CARDIA)    Difficulty of Paying Living Expenses: Not hard at all  Food Insecurity: No Food Insecurity (04/30/2019)   Hunger Vital Sign    Worried About Running Out of Food in the Last Year: Never true     Ran Out of Food in the Last Year: Never true  Transportation Needs: No Transportation Needs (04/30/2019)   PRAPARE - Hydrologist (Medical): No    Lack of Transportation (Non-Medical): No  Physical Activity: Inactive (04/30/2019)   Exercise Vital Sign    Days of Exercise per Week: 0 days    Minutes of Exercise per Session: 0 min  Stress: No Stress Concern Present (04/30/2019)   [place]    Feeling of Stress : Not at all  Social Connections: Moderately Isolated (04/30/2019)  Social Licensed conveyancer [NHANES]    Frequency of Communication with Friends and Family: More than three times a week    Frequency of Social Gatherings with Friends and Family: More than three times a week    Attends Religious Services: Never    Marine scientist or Organizations: Yes    Attends Archivist Meetings: Never    Marital Status: Divorced     Family History: The patient's family history includes Coronary artery disease in his father and mother; Diabetes in his mother.  ROS:   Please see the history of present illness.    All other systems reviewed and are negative.  EKGs/Labs/Other Studies Reviewed:    The following studies were reviewed today:  July 14, 2022 in clinic device interrogation personally reviewed Battery longevity 9.6 years Presenting rhythm is sinus Lead parameters are all stable No high-voltage therapies delivered Atrial pacing 80%  EKG shows an atrial paced rhythm.  Inferior infarct.  Recent Labs: 03/03/2022: Magnesium 2.3 05/21/2022: Hemoglobin 15.7; Platelets 162 06/14/2022: ALT 11; TSH 1.014 07/13/2022: B Natriuretic Peptide 371.2; BUN 9; Creatinine, Ser 0.73; Potassium 2.9; Sodium 140  Recent Lipid Panel    Component Value Date/Time   CHOL 174 02/01/2022 1550   TRIG 230 (H) 02/01/2022 1550   HDL 37 (L) 02/01/2022 1550   CHOLHDL 4.7  02/01/2022 1550   VLDL 46 (H) 02/01/2022 1550   LDLCALC 91 02/01/2022 1550    Physical Exam:    VS:  BP (!) 142/82   Ht 5\' 6"  (1.676 m)   Wt 200 lb (90.7 kg)   SpO2 98%   BMI 32.28 kg/m     Wt Readings from Last 3 Encounters:  07/14/22 200 lb (90.7 kg)  07/13/22 200 lb 4 oz (90.8 kg)  06/14/22 206 lb 8 oz (93.7 kg)     GEN:  Well nourished, well developed in no acute distress HEENT: Normal NECK: No JVD LYMPHATICS: No lymphadenopathy CARDIAC: RRR, no murmurs, rubs, gallops.  ICD pocket well-healed RESPIRATORY:  Clear to auscultation without rales, wheezing or rhonchi  ABDOMEN: Soft, non-tender, non-distended MUSCULOSKELETAL:  No edema; No deformity  SKIN: Warm and dry NEUROLOGIC:  Alert and oriented x 3 PSYCHIATRIC:  Normal affect        ASSESSMENT:    1. HFrEF (heart failure with reduced ejection fraction) ([facility])   2. Ventricular tachycardia ([place])   3. Implantable cardioverter-defibrillator (ICD) in situ   4. Encounter for long-term (current) use of high-risk medication    PLAN:    In order of problems listed above:  #Chronic systolic heart failure NYHA class II.  Warm and dry on exam today. Continue home medications  #Ventricular tachycardia #ICD in situ Device functioning appropriately.  Continue remote monitoring follow-up 6 months for amiodarone monitoring.  Blood work was last checked December 11 and showed stable liver and thyroid function.   Follow-up 6 months with APP.  Should have a CMP, TSH and free T4 done at that visit.   Medication Adjustments/Labs and Tests Ordered: Current medicines are reviewed at length with the patient today.  Concerns regarding medicines are outlined above.  Orders Placed This Encounter  Procedures   EKG 12-Lead   No orders of the defined types were placed in this encounter.    Signed, Lars Mage, MD, [facility], [facility] 07/14/2022 10:55 AM    Electrophysiology [facility]

## 2022-07-14 NOTE — Patient Instructions (Signed)
Medication Instructions:  Your physician recommends that you continue on your current medications as directed. Please refer to the Current Medication list given to you today.  *If you need a refill on your cardiac medications before your next appointment, please call your pharmacy*   Lab Work: None ordered.  If you have labs (blood work) drawn today and your tests are completely normal, you will receive your results only by: [place] (if you have MyChart) OR A paper copy in the mail If you have any lab test that is abnormal or we need to change your treatment, we will call you to review the results.   Testing/Procedures: None ordered.    Follow-Up: At [facility], you and your health needs are our priority.  As part of our continuing mission to provide you with exceptional heart care, we have created designated Provider Care Teams.  These Care Teams include your primary Cardiologist (physician) and Advanced Practice Providers (APPs -  Physician Assistants and Nurse Practitioners) who all work together to provide you with the care you need, when you need it.  We recommend signing up for the patient portal called "MyChart".  Sign up information is provided on this After Visit Summary.  MyChart is used to connect with patients for Virtual Visits (Telemedicine).  Patients are able to view lab/test results, encounter notes, upcoming appointments, etc.  Non-urgent messages can be sent to your provider as well.   To learn more about what you can do with MyChart, go to [email or web address].    Your next appointment:   6 months with Dr Mardene Speak APP, Mamie Levers  Important Information About Sugar

## 2022-07-16 ENCOUNTER — Encounter: Payer: Self-pay | Admitting: Cardiology

## 2022-07-21 ENCOUNTER — Ambulatory Visit: Payer: Medicaid Other | Attending: Cardiology

## 2022-07-21 DIAGNOSIS — I255 Ischemic cardiomyopathy: Secondary | ICD-10-CM

## 2022-07-22 LAB — CUP PACEART REMOTE DEVICE CHECK
Battery Remaining Longevity: 114 mo
Battery Voltage: 3.01 V
Brady Statistic AP VP Percent: 0.07 %
Brady Statistic AP VS Percent: 87.98 %
Brady Statistic AS VP Percent: 0 %
Brady Statistic AS VS Percent: 11.96 %
Brady Statistic RA Percent Paced: 87.5 %
Brady Statistic RV Percent Paced: 0.07 %
Date Time Interrogation Session: 20240117033622
HighPow Impedance: 60 Ohm
Implantable Lead Connection Status: 753985
Implantable Lead Connection Status: 753985
Implantable Lead Implant Date: 20231004
Implantable Lead Implant Date: 20231004
Implantable Lead Location: 753859
Implantable Lead Location: 753860
Implantable Lead Model: 5076
Implantable Pulse Generator Implant Date: 20231004
Lead Channel Impedance Value: 342 Ohm
Lead Channel Impedance Value: 456 Ohm
Lead Channel Impedance Value: 456 Ohm
Lead Channel Pacing Threshold Amplitude: 0.75 V
Lead Channel Pacing Threshold Amplitude: 1.125 V
Lead Channel Pacing Threshold Pulse Width: 0.4 ms
Lead Channel Pacing Threshold Pulse Width: 0.4 ms
Lead Channel Sensing Intrinsic Amplitude: 2.375 mV
Lead Channel Sensing Intrinsic Amplitude: 2.375 mV
Lead Channel Sensing Intrinsic Amplitude: 28 mV
Lead Channel Sensing Intrinsic Amplitude: 28 mV
Lead Channel Setting Pacing Amplitude: 1.5 V
Lead Channel Setting Pacing Amplitude: 1.75 V
Lead Channel Setting Pacing Pulse Width: 0.4 ms
Lead Channel Setting Sensing Sensitivity: 0.3 mV
Zone Setting Status: 755011

## 2022-08-03 NOTE — Progress Notes (Signed)
ADVANCED HF CLINIC NOTE  Referring Physician: Kathlyn Sacramento, [name] Primary Care: Patient, No Pcp Per Primary Cardiologist: Glen Sacramento, [name]   HPI:  Glen Cabal. is a 67 y.o. male with CAD, DM2, PVCs, former smoker, previous CVA, HTN, OSA and chronic systolic HF referred by Dr. Fletcher Anon for further evaluation of his HF.  He has known history of CAD s/p  remote MI with previous LAD,diagonal and RCA stenting at [facility]. Cath 2016 showed patent LAD and diagonal stents and chronically occluded RCA and left to right collaterals.  He also has chronic systolic heart failure due to ischemic cardiomyopathy EF 25-30%  Cath 2/21 with 3v CAD with patent stents in the LAD and diagonal without significant restenosis, chronically occluded RCA stents with right to right and left-to-right collaterals and significant stenosis in the distal left circumflex supplying a relatively small OM 3 distribution.  Ejection fraction was 25 to 30%.  Right heart catheterization showed normal filling pressures, mild pulmonary hypertension and normal cardiac output.  Medical therapy was recommended.  Had NSTEMI 4/21 Cath showed occluded distal left circumflex which was treated successfully with PCI and drug-eluting stent placement.     He was admitted 6/23 with chest pain and heart failure.  Echo EF 30-35%.  Cath was done showed patent LAD and left circumflex stents with chronically occluded right coronary artery with left-to-right collaterals.  Medical therapy was recommended.  The patient did not fill his prescriptions.  He returned to the emergency room after he was stung by multiple yellow jackets and was found to be in ventricular tachycardia with a heart rate of 175 bpm.  He was cardioverted with 120 J shock.  He went back into ventricular tachycardia and was treated successfully with IV amiodarone.     Admitted 7/23 with VTin the setting of not taking amiodarone as prescribed.  He ultimately underwent ICD placement by Dr.  Quentin Ore in October.  I saw him for the first time in 12/23. He was not taking Entresto regularly because it dragged him down. Stopped entresto and added eplerenone. He stopped eplerenone because it made him feel anxious and he couldn't sleep. At last visit started sertraline for anxiety  Sleep study 1/24 with mild OSA (AHI 9)    Past Medical History:  Diagnosis Date   Anginal pain (HCC)    Arthritis    CAD (coronary artery disease)    a. s/p MI with LAD and Diag stenting @ [name];  b. 06/2015 Cath: LAD 53m/d ISR, 100 RCA (ISR) w/ L->R collats, otw mod nonobs dzs-->Med Rx; c. 08/2019 Cath: LM nl, LAD 10p/m ISR, D1 20, D2 100, RI min irregs, LCX 15m/d, OM1 100, OM2 50, RCA 100p, 70d. RPDA fills via collats from LAD. EF 25-35%-->Med Rx; d. 10/2019 NSTEMI/Cath: LCX now 100 (2.75x15 Resolute Onyx DES), otw stable compared to 08/2019.   Chronic combined systolic and diastolic CHF (congestive heart failure) ([place])    a. 06/2015 Echo: EF 20-25%, Gr3 DD; b. 10/2019 Echo: EF 25-30%, glob HK, sev inf/infapical HK. Mod dil LA.   Dyspnea    Essential hypertension    Headache 12/23/2021   Hyperlipidemia    Hypokalemia    a. 06/2015 in setting of diuresis.   Ischemic cardiomyopathy    a. 2011 EF 45% ([name]);  b. 06/2015 Echo: EF 20-25%; c. 04/2019 Echo: EF 40-45%; d. 08/2019 LV gram: EF 25-35%; e. 10/2019 Echo: EF 25-30%.   PVC's (premature ventricular contractions)    a. 09/2019 Zio (3  days): Avg hr 70, 4 runs NSVT, 5 runs SVT, rare PACs, frequent PVCs w/ 11.8% burden.   Sleep apnea    Stroke [facility])    Stroke/Right temporal lobe infarction [facility])    a. 10/2019 MRI brain: 1cm acute ischemic nonhemorrhagic R temporal lobe infarct. Age-related cerebral atrophy w/ moderate chronic small vessel ischemic dzs.   Type 2 diabetes mellitus with hyperglycemia ([place]) 10/08/2019    Current Outpatient Medications  Medication Sig Dispense Refill   acetaminophen (TYLENOL) 500 MG tablet Take 1,000 mg by mouth as needed.      amiodarone (PACERONE) 200 MG tablet Take 1 tablet (200 mg total) by mouth daily. 90 tablet 1   aspirin EC 81 MG tablet Take 1 tablet (81 mg total) by mouth daily. Swallow whole. 90 tablet 3   carvedilol (COREG) 3.125 MG tablet Take 1 tablet (3.125 mg total) by mouth 2 (two) times daily with a meal. 180 tablet 3   ezetimibe (ZETIA) 10 MG tablet Take 1 tablet (10 mg total) by mouth daily. 90 tablet 3   furosemide (LASIX) 40 MG tablet Take 1 tablet (40 mg total) by mouth daily. 30 tablet 3   ibuprofen (ADVIL) 200 MG tablet Take by mouth as needed.     losartan (COZAAR) 50 MG tablet Take 1 tablet (50 mg total) by mouth daily. 30 tablet 3   methocarbamol (ROBAXIN) 500 MG tablet Take 1 tablet (500 mg total) by mouth 3 (three) times daily as needed. 90 tablet 0   nitroGLYCERIN (NITROSTAT) 0.4 MG SL tablet Place 1 tablet (0.4 mg total) under the tongue every 5 (five) minutes as needed for chest pain. 90 tablet 3   sertraline (ZOLOFT) 50 MG tablet Take 0.5 tablets (25 mg total) by mouth daily for 7 days, THEN 1 tablet (50 mg total) daily. 93.5 tablet 0   sodium chloride (OCEAN) 0.65 % SOLN nasal spray Place 1 spray into both nostrils as needed for congestion.     No current facility-administered medications for this visit.    Allergies  Allergen Reactions   Contrast Media [Iodinated Contrast Media] Shortness Of Breath   Iohexol Shortness Of Breath     Onset Date: [id number]    Jardiance [Empagliflozin] Other (See Comments)    Fatigue/weakness   Atorvastatin Other (See Comments)    Myalgias    Hydrocodone Itching   Morphine And Related Other (See Comments)    Lost control    Penicillins Other (See Comments)    Unknown reaction Did it involve swelling of the face/tongue/throat, SOB, or low BP? Unknown Did it involve sudden or severe rash/hives, skin peeling, or any reaction on the inside of your mouth or nose? Unknown Did you need to seek medical attention at a hospital or doctor's office?  Yes When did it last happen?      Childhood allergy  If all above answers are "NO", may proceed with cephalosporin use.    Rosuvastatin Other (See Comments)    Myalgias       Social History   Socioeconomic History   Marital status: Widowed    Spouse name: Not on file   Number of children: 2   Years of education: Not on file   Highest education level: GED or equivalent  Occupational History   Occupation: Brick layer   Tobacco Use   Smoking status: Former    Types: Cigarettes    Quit date: 06/10/2011    Years since quitting: 11.1   Smokeless tobacco: Never  Substance  and Sexual Activity   Alcohol use: No    Alcohol/week: 0.0 standard drinks of alcohol   Drug use: No   Sexual activity: Not Currently  Other Topics Concern   Not on file  Social History Narrative   Lives alone.   Social Determinants of Health   Financial Resource Strain: Low Risk  (04/30/2019)   Overall Financial Resource Strain (CARDIA)    Difficulty of Paying Living Expenses: Not hard at all  Food Insecurity: No Food Insecurity (04/30/2019)   Hunger Vital Sign    Worried About Running Out of Food in the Last Year: Never true    Ran Out of Food in the Last Year: Never true  Transportation Needs: No Transportation Needs (04/30/2019)   PRAPARE - Administrator, Civil Service (Medical): No    Lack of Transportation (Non-Medical): No  Physical Activity: Inactive (04/30/2019)   Exercise Vital Sign    Days of Exercise per Week: 0 days    Minutes of Exercise per Session: 0 min  Stress: No Stress Concern Present (04/30/2019)   [employer] of Occupational Health - Occupational Stress Questionnaire    Feeling of Stress : Not at all  Social Connections: Moderately Isolated (04/30/2019)   Social Connection and Isolation Panel [NHANES]    Frequency of Communication with Friends and Family: More than three times a week    Frequency of Social Gatherings with Friends and Family: More than three  times a week    Attends Religious Services: Never    Database administrator or Organizations: Yes    Attends Banker Meetings: Never    Marital Status: Divorced  Catering manager Violence: Not At Risk (04/30/2019)   Humiliation, Afraid, Rape, and Kick questionnaire    Fear of Current or Ex-Partner: No    Emotionally Abused: No    Physically Abused: No    Sexually Abused: No      Family History  Problem Relation Age of Onset   Coronary artery disease Mother    Diabetes Mother    Coronary artery disease Father     There were no vitals filed for this visit.   PHYSICAL EXAM: General:  Well appearing. No resp difficulty HEENT: normal Neck: supple. no JVD. Carotids 2+ bilat; no bruits. No lymphadenopathy or thryomegaly appreciated. Cor: PMI nondisplaced. Regular rate & rhythm. No rubs, gallops or murmurs. Lungs: clear with decreased breath sounds throughout Abdomen: soft, nontender, nondistended. No hepatosplenomegaly. No bruits or masses. Good bowel sounds. Extremities: no cyanosis, clubbing, rash, edema Neuro: alert & orientedx3, cranial nerves grossly intact. moves all 4 extremities w/o difficulty. Affect pleasant  ICD interrogation: No VT/AF. Fluid up Activity 2-3 hr/day   ASSESSMENT & PLAN:  1.  Chronic systolic heart failure due to iCM - Echo 6/23 EF 30-35%.   - s/p MDT ICD - Stable NYHA III - Volume status mildly elevated. Encouraged him to take lasix 40 daily - Take lasix 40 mg today and prn for weight gain  - Continue carvedilol - Unable to tolerate eplerenone due to anxiety. Will hold for now - Unable to tolerate Entresto due to fatigue. Start losartan 50 daily - He did not tolerate Jardiance in the past due to fatigue and weakness.  He does not want to try another SGLT2 inhibitor. - ICD interrogation in clinic today. Results as above - Medical management limited by multiple drug intolerances. We discussed the impact that could have on his HF  prognosis and symptoms and goal  to get him on at least 3/4 GDMT meds    2. CAD  - s/p remote MI. S/p PCI LAD, LCx and RCA - Cath 6/23: patent LAD and left circumflex stents with chronically occluded right coronary artery with left-to-right collaterals.  Medical therapy was recommended. - No s/s angina - Continue DAPT. He doesn't tolerate statin - Follows with Dr. Fletcher Anon   3  Essential hypertension:  - BP up.  - Med changes as above   4. H/o ventricular tachycardia: - Status post ICD placement.   - continue amio  - check amio labs as above  5. H/o PVCs - Zio 3/21: Avg hr 70, 4 runs NSVT, 5 runs SVT, rare PACs, frequent PVCs w/ 11.8% burden. - consider repeat  6. Mild OSA - Sleep study 1/24 with mild OSA (AHI 9)   7. Anxiety - suspect this is a big part of his symptoms - will start sertraline 25 daily. Increase to 50 in 1 week    Glen Bickers, [name]  9:48 AM

## 2022-08-04 ENCOUNTER — Encounter: Payer: Self-pay | Admitting: Internal Medicine

## 2022-08-04 ENCOUNTER — Other Ambulatory Visit
Admission: RE | Admit: 2022-08-04 | Discharge: 2022-08-04 | Disposition: A | Discharge status: Home / Self Care | Payer: Medicaid Other | Source: Ambulatory Visit | Attending: Internal Medicine | Admitting: Internal Medicine

## 2022-08-04 ENCOUNTER — Other Ambulatory Visit: Payer: Self-pay

## 2022-08-04 ENCOUNTER — Ambulatory Visit (HOSPITAL_BASED_OUTPATIENT_CLINIC_OR_DEPARTMENT_OTHER): Payer: Medicaid Other | Admitting: Internal Medicine

## 2022-08-04 VITALS — BP 170/94 | HR 74 | Resp 20 | Wt 206.5 lb

## 2022-08-04 DIAGNOSIS — I472 Ventricular tachycardia, unspecified: Secondary | ICD-10-CM

## 2022-08-04 DIAGNOSIS — I272 Pulmonary hypertension, unspecified: Secondary | ICD-10-CM | POA: Insufficient documentation

## 2022-08-04 DIAGNOSIS — Z8673 Personal history of transient ischemic attack (TIA), and cerebral infarction without residual deficits: Secondary | ICD-10-CM | POA: Insufficient documentation

## 2022-08-04 DIAGNOSIS — I252 Old myocardial infarction: Secondary | ICD-10-CM | POA: Insufficient documentation

## 2022-08-04 DIAGNOSIS — I110 Hypertensive heart disease with heart failure: Secondary | ICD-10-CM | POA: Insufficient documentation

## 2022-08-04 DIAGNOSIS — F419 Anxiety disorder, unspecified: Secondary | ICD-10-CM | POA: Insufficient documentation

## 2022-08-04 DIAGNOSIS — I493 Ventricular premature depolarization: Secondary | ICD-10-CM | POA: Insufficient documentation

## 2022-08-04 DIAGNOSIS — R0602 Shortness of breath: Secondary | ICD-10-CM | POA: Insufficient documentation

## 2022-08-04 DIAGNOSIS — I5022 Chronic systolic (congestive) heart failure: Secondary | ICD-10-CM | POA: Insufficient documentation

## 2022-08-04 DIAGNOSIS — I5020 Unspecified systolic (congestive) heart failure: Secondary | ICD-10-CM | POA: Diagnosis not present

## 2022-08-04 DIAGNOSIS — I10 Essential (primary) hypertension: Secondary | ICD-10-CM | POA: Diagnosis not present

## 2022-08-04 DIAGNOSIS — I2510 Atherosclerotic heart disease of native coronary artery without angina pectoris: Secondary | ICD-10-CM | POA: Insufficient documentation

## 2022-08-04 DIAGNOSIS — Z955 Presence of coronary angioplasty implant and graft: Secondary | ICD-10-CM | POA: Insufficient documentation

## 2022-08-04 DIAGNOSIS — I4510 Unspecified right bundle-branch block: Secondary | ICD-10-CM | POA: Insufficient documentation

## 2022-08-04 DIAGNOSIS — Z79899 Other long term (current) drug therapy: Secondary | ICD-10-CM | POA: Insufficient documentation

## 2022-08-04 DIAGNOSIS — G4733 Obstructive sleep apnea (adult) (pediatric): Secondary | ICD-10-CM | POA: Insufficient documentation

## 2022-08-04 DIAGNOSIS — R519 Headache, unspecified: Secondary | ICD-10-CM | POA: Insufficient documentation

## 2022-08-04 DIAGNOSIS — Z87891 Personal history of nicotine dependence: Secondary | ICD-10-CM | POA: Insufficient documentation

## 2022-08-04 DIAGNOSIS — E119 Type 2 diabetes mellitus without complications: Secondary | ICD-10-CM | POA: Insufficient documentation

## 2022-08-04 DIAGNOSIS — I255 Ischemic cardiomyopathy: Secondary | ICD-10-CM | POA: Insufficient documentation

## 2022-08-04 LAB — BASIC METABOLIC PANEL
Anion gap: 8 (ref 5–15)
BUN: 12 mg/dL (ref 8–23)
CO2: 32 mmol/L (ref 22–32)
Calcium: 8.6 mg/dL — ABNORMAL LOW (ref 8.9–10.3)
Chloride: 100 mmol/L (ref 98–111)
Creatinine, Ser: 0.7 mg/dL (ref 0.61–1.24)
GFR, Estimated: >60 mL/min (ref >60)
Glucose, Bld: 151 mg/dL — ABNORMAL HIGH (ref 70–99)
Potassium: 2.9 mmol/L — ABNORMAL LOW (ref 3.5–5.1)
Sodium: 140 mmol/L (ref 135–145)

## 2022-08-04 LAB — BRAIN NATRIURETIC PEPTIDE: B Natriuretic Peptide: 502.7 pg/mL — ABNORMAL HIGH (ref 0.0–100.0)

## 2022-08-04 MED ORDER — LOSARTAN POTASSIUM 50 MG PO TABS
50.0000 mg | ORAL_TABLET | Freq: Two times a day (BID) | ORAL | 3 refills | Status: DC
  Filled 2022-08-04 (×2): qty 60, 30d supply, fill #0
  Filled 2022-09-07: qty 60, 30d supply, fill #1
  Filled 2022-10-01: qty 60, 30d supply, fill #2

## 2022-08-04 MED ORDER — CARVEDILOL 6.25 MG PO TABS
6.2500 mg | ORAL_TABLET | Freq: Two times a day (BID) | ORAL | 3 refills | Status: DC
  Filled 2022-08-04: qty 60, 30d supply, fill #0
  Filled 2023-03-21: qty 60, 30d supply, fill #1

## 2022-08-04 NOTE — Patient Instructions (Signed)
Medication Changes:  Take Furosemide 40 mg Twice daily FOR 2 DAYS ONLY, then back to 40 mg Daily  Increase Losartan to 50 mg Daily  Increase Carvedilol to 6.25 mg Twice daily   Lab Work:  Labs done today, your results will be available in MyChart, we will contact you for abnormal readings.   Testing/Procedures:  none  Referrals:  none  Special Instructions // Education:  Do the following things EVERYDAY: Weigh yourself in the morning before breakfast. Write it down and keep it in a log. Take your medicines as prescribed Eat low salt foods--Limit salt (sodium) to 2000 mg per day.  Stay as active as you can everyday Limit all fluids for the day to less than 2 liters   Follow-Up in: 6 weeks, we'll call you to schedule this appointment    If you have any questions or concerns before your next appointment please send [place] a message through mychart or call our office at [phone] Monday-Friday 8 am-5 pm.   If you have an urgent need after hours on the weekend please call your Primary Cardiologist or the [facility] in [place] at [phone].

## 2022-08-05 ENCOUNTER — Other Ambulatory Visit: Payer: Self-pay

## 2022-08-06 ENCOUNTER — Telehealth: Payer: Self-pay | Admitting: Pulmonary Disease

## 2022-08-06 ENCOUNTER — Other Ambulatory Visit: Payer: Self-pay

## 2022-08-06 ENCOUNTER — Telehealth: Payer: Self-pay

## 2022-08-06 DIAGNOSIS — I5022 Chronic systolic (congestive) heart failure: Secondary | ICD-10-CM

## 2022-08-06 DIAGNOSIS — G4733 Obstructive sleep apnea (adult) (pediatric): Secondary | ICD-10-CM

## 2022-08-06 MED ORDER — POTASSIUM CHLORIDE CRYS ER 20 MEQ PO TBCR
40.0000 meq | EXTENDED_RELEASE_TABLET | Freq: Every day | ORAL | 3 refills | Status: DC
  Filled 2022-08-06: qty 70, 35d supply, fill #0
  Filled 2022-09-08: qty 70, 35d supply, fill #1

## 2022-08-06 NOTE — Telephone Encounter (Signed)
Sleep apnea with AHI of 14.3 and oxygen saturations as low as 86%.,  And CPAP at 5 to 20 cm H2O with heated humidity and mask of choice.

## 2022-08-06 NOTE — Telephone Encounter (Addendum)
  Spoke w pt regarding labs and potassium below. Pt aware, agreeable, and verbalized understanding. Pt verbalizes he will pick up Rx for potassium 40 meq daily. Pt confirmed he will take 4 extra potassium tablets today only, for a total of 120 meq today. He will resume 40 meq tomorrow.  Will come for BMET lab Monday. Placed order for bmet.        ----- Message from Jolaine Artist, MD sent at 08/06/2022 11:52 AM EST ----- Potassium very low. Please have him take kcl 120 todaya nd then 40 daily. Repeat bmet Monday. Needs to start potassium today

## 2022-08-06 NOTE — Telephone Encounter (Signed)
I have notified the patient and put an order in for the CPAP.  Nothing further needed.

## 2022-08-06 NOTE — Telephone Encounter (Signed)
Patient did in lab sleep study on 07/12/2022 and the results are scanned into Epic

## 2022-08-09 ENCOUNTER — Encounter: Payer: Self-pay | Admitting: Pulmonary Disease

## 2022-08-09 ENCOUNTER — Other Ambulatory Visit
Admission: RE | Admit: 2022-08-09 | Discharge: 2022-08-09 | Disposition: A | Discharge status: Home / Self Care | Payer: Medicaid Other | Source: Ambulatory Visit | Attending: Internal Medicine | Admitting: Internal Medicine

## 2022-08-09 ENCOUNTER — Ambulatory Visit (INDEPENDENT_AMBULATORY_CARE_PROVIDER_SITE_OTHER): Payer: Medicaid Other | Admitting: Pulmonary Disease

## 2022-08-09 VITALS — BP 124/82 | HR 63 | Temp 97.7°F | Ht 66.0 in | Wt 200.0 lb

## 2022-08-09 DIAGNOSIS — G4733 Obstructive sleep apnea (adult) (pediatric): Secondary | ICD-10-CM | POA: Diagnosis not present

## 2022-08-09 DIAGNOSIS — I5022 Chronic systolic (congestive) heart failure: Secondary | ICD-10-CM | POA: Diagnosis present

## 2022-08-09 DIAGNOSIS — J449 Chronic obstructive pulmonary disease, unspecified: Secondary | ICD-10-CM | POA: Diagnosis not present

## 2022-08-09 DIAGNOSIS — I255 Ischemic cardiomyopathy: Secondary | ICD-10-CM | POA: Diagnosis not present

## 2022-08-09 DIAGNOSIS — R0602 Shortness of breath: Secondary | ICD-10-CM

## 2022-08-09 LAB — BASIC METABOLIC PANEL
Anion gap: 11 (ref 5–15)
BUN: 16 mg/dL (ref 8–23)
CO2: 23 mmol/L (ref 22–32)
Calcium: 9.1 mg/dL (ref 8.9–10.3)
Chloride: 106 mmol/L (ref 98–111)
Creatinine, Ser: 0.87 mg/dL (ref 0.61–1.24)
GFR, Estimated: >60 mL/min (ref >60)
Glucose, Bld: 151 mg/dL — ABNORMAL HIGH (ref 70–99)
Potassium: 4.2 mmol/L (ref 3.5–5.1)
Sodium: 140 mmol/L (ref 135–145)

## 2022-08-09 MED ORDER — STIOLTO RESPIMAT 2.5-2.5 MCG/ACT IN AERS: 2.0000 | INHALATION_SPRAY | Freq: Every day | RESPIRATORY_TRACT | 0 refills | Status: DC

## 2022-08-09 NOTE — Progress Notes (Unsigned)
Subjective:    Patient ID: Glen Cabal., male    DOB: 07-06-1955, 67 y.o.   MRN: [id number] Patient Care Team: Patient, No Pcp Per as PCP - General (General Practice) Wellington Hampshire, MD as PCP - Cardiology (Cardiology) Vickie Epley, MD as PCP - Electrophysiology (Cardiology) Tyler Pita, MD as Consulting Physician (Pulmonary Disease)  Chief Complaint  Patient presents with   Follow-up    SOB constant. No wheezing. Congestion for 2-3 days. Cough with clear to yellow sputum.    HPI Patient is a 67 year old former smoker with a 60-pack-year smoking history and a medical history as noted below who presents for follow-up on the issue of dyspnea, his initial visit here was on 15 March 2022.  This is a scheduled visit.  The patient has a history of chronic congestive heart failure with EF of 30% to 35% and stage I COPD.  His dyspnea is out of proportion to his very mild pulmonary impairment.  Likely mostly related to his cardiac disease.  He was recently diagnosed with obstructive sleep apnea by in lab sleep study performed on 19 July 2022.  AHI of 14.5 to nadir of 86%.  He has been set up with auto CPAP however has not received the equipment yet.  During the visit today the patient states that he continues to have shortness of breath has been present for over a year.  He notes that he breathes better in cooler temperatures.  Inhalers and nebulizers are not reliable in helping his symptoms.  Over the last 2 to 3 days he has had some cough productive of clear to whitish sputum.  Cough is not usually a problem for him.  He just feels slightly more congested.   He has had severe issues with cardiac disease and has required several stents.  He is noted to have an ischemic cardiomyopathy with LVEF of approximately 30%.  He states that when he first developed shortness of breath he had a stent placed and after that he felt great.  However he had subsequent problems and a second  stent was placed but he felt no improvement whatsoever on his symptoms.  He has significant orthopnea and has to sleep on 3 pillows.  He notes frequent nocturnal awakenings with shortness of breath and feeling that he is gasping.  He is hoping CPAP will help some of these issues. He has not had any fevers, chills or sweats.  No recent chest pain.  He has not had any weight loss or anorexia.   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  He does not have any military history.   Review of Systems A 10 point review of systems was performed and it is as noted above otherwise negative.  Patient Active Problem List   Diagnosis Date Noted   COPD suggested by initial evaluation ([place]) 04/15/2022   Acute urinary retention 02/02/2022   Tongue laceration 02/02/2022   Acute nonintractable headache 02/02/2022   Non compliance w medication regimen    History of stroke 01/12/2022   Ventricular tachycardia ([place]) 01/12/2022   Demand ischemia    Anaphylactic reaction to wasp sting, assault, initial encounter 01/10/2022   SOB (shortness of breath)    Obesity (BMI 30-39.9) 12/30/2021   Elevated troponin    Chronic HFrEF (heart failure with reduced ejection fraction) ([place]) 12/24/2021   Acute respiratory failure  with hypoxia ([place]) 12/24/2021   Chest pain 12/23/2021   Acute CVA (cerebrovascular accident) ([place]) 06/19/2021   Stroke ([place])    NSTEMI (non-ST elevated myocardial infarction) ([place]) 10/08/2019   Type 2 diabetes mellitus with hyperglycemia ([place]) 10/08/2019   Ischemic cardiomyopathy    Unstable angina ([place]) 06/10/2015   CAD (coronary artery disease) 06/10/2015   Acute on chronic systolic CHF (congestive heart failure) ([place]) 06/10/2015   Essential hypertension    Hyperlipidemia    Social History   Tobacco Use   Smoking status: Former    Types: Cigarettes    Quit date: 06/10/2011    Years since quitting: 11.1    Smokeless tobacco: Never  Substance Use Topics   Alcohol use: No    Alcohol/week: 0.0 standard drinks of alcohol    Allergies  Allergen Reactions   Contrast Media [Iodinated Contrast Media] Shortness Of Breath   Iohexol Shortness Of Breath     Onset Date: [id number]    Jardiance [Empagliflozin] Other (See Comments)    Fatigue/weakness   Atorvastatin Other (See Comments)    Myalgias    Hydrocodone Itching   Morphine And Related Other (See Comments)    Lost control    Penicillins Other (See Comments)    Unknown reaction Did it involve swelling of the face/tongue/throat, SOB, or low BP? Unknown Did it involve sudden or severe rash/hives, skin peeling, or any reaction on the inside of your mouth or nose? Unknown Did you need to seek medical attention at a hospital or doctor's office? Yes When did it last happen?      Childhood allergy  If all above answers are "NO", may proceed with cephalosporin use.    Rosuvastatin Other (See Comments)    Myalgias    Current Meds  Medication Sig   amiodarone (PACERONE) 200 MG tablet Take 1 tablet (200 mg total) by mouth daily.   aspirin EC 81 MG tablet Take 1 tablet (81 mg total) by mouth daily. Swallow whole.   carvedilol (COREG) 6.25 MG tablet Take 1 tablet (6.25 mg total) by mouth 2 (two) times daily with a meal.   ezetimibe (ZETIA) 10 MG tablet Take 1 tablet (10 mg total) by mouth daily.   furosemide (LASIX) 40 MG tablet Take 1 tablet (40 mg total) by mouth daily.   gabapentin (NEURONTIN) 100 MG capsule Take 200 mg by mouth at bedtime.   losartan (COZAAR) 50 MG tablet Take 1 tablet (50 mg total) by mouth in the morning and at bedtime.   methocarbamol (ROBAXIN) 500 MG tablet Take 1 tablet (500 mg total) by mouth 3 (three) times daily as needed.   nitroGLYCERIN (NITROSTAT) 0.4 MG SL tablet Place 1 tablet (0.4 mg total) under the tongue every 5 (five) minutes as needed for chest pain.   phenylephrine (NEO-SYNEPHRINE) 0.5 % nasal solution Place  1 drop into both nostrils at bedtime.   potassium chloride SA (KLOR-CON M) 20 MEQ tablet Take 2 tablets (40 mEq total) by mouth daily.   sertraline (ZOLOFT) 50 MG tablet Take 0.5 tablets (25 mg total) by mouth daily for 7 days, THEN 1 tablet (50 mg total) daily.   sodium chloride (OCEAN) 0.65 % SOLN nasal spray Place 1 spray into both nostrils as needed for congestion.   Immunization History  Administered Date(s) Administered   Fluad Quad(high Dose 65+) 06/20/2021, 05/11/2022      Objective:   Physical Exam BP 124/82 (BP Location: Left Arm, Cuff Size: Large)   Pulse 63  Temp 97.7 F (36.5 C)   Ht 5\' 6"  (1.676 m)   Wt 200 lb (90.7 kg)   SpO2 95%   BMI 32.28 kg/m   SpO2: 95 % O2 Device: None (Room air)  GENERAL: Obese gentleman, no acute distress, fully ambulatory, no conversational dyspnea HEAD: Normocephalic, atraumatic.  EYES: Pupils equal, round, reactive to light.  No scleral icterus.  MOUTH: Mallampati IV, oral mucosa moist, no thrush. NECK: Supple. No thyromegaly. Trachea midline. No JVD.  No adenopathy. PULMONARY: Distant breath sounds bilaterally.  Coarse, otherwise, no adventitious sounds. CARDIOVASCULAR: S1 and S2.  Bradycardic rate and regular rhythm.  Grade 2/6 systolic ejection murmur left sternal border. ABDOMEN: Obese, otherwise benign. MUSCULOSKELETAL: No joint deformity, no clubbing, no edema.  NEUROLOGIC: No overt focal deficit, no gait disturbance, speech is fluent. SKIN: Intact,warm,dry. PSYCH: Mood and behavior normal.     Assessment & Plan:     ICD-10-CM   1. SOB (shortness of breath)  R06.02    Multifactorial Largely cardiac component Minimal obstructive disease Restrictive physiology    2. Stage 1 mild COPD by GOLD classification (HCC)  J44.9    Trial of Stiolto 2 puffs daily Patient given samples and taught use of the inhaler    3. OSA (obstructive sleep apnea)  G47.33    Awaiting auto CPAP equipment    4. Ischemic cardiomyopathy  I25.5     This issue adds complexity to his management Suspect large component of his dyspnea issues     Meds ordered this encounter  Medications   Tiotropium Bromide-Olodaterol (STIOLTO RESPIMAT) 2.5-2.5 MCG/ACT AERS    Sig: Inhale 2 puffs into the lungs daily.    Dispense:  4 g    Refill:  0    Order Specific Question:   Lot Number?    Answer:   [id number]    Order Specific Question:   Expiration Date?    Answer:   03/05/2024    Order Specific Question:   Quantity    Answer:   2   Will see the patient in follow-up in 6 to 8 weeks time he is to contact us prior to that time should any new difficulties arise.  Renold Don, MD Advanced Bronchoscopy PCCM [facility]    *This note was dictated using voice recognition software/Dragon.  Despite best efforts to proofread, errors can occur which can change the meaning. Any transcriptional errors that result from this process are unintentional and may not be fully corrected at the time of dictation.

## 2022-08-09 NOTE — Patient Instructions (Signed)
Your CPAP equipment has been ordered.    We are giving you a trial of an inhaler called Stiolto this is 2 puffs once a day.  We will see you in follow-up in 6 to 8 weeks time.

## 2022-08-10 ENCOUNTER — Encounter: Payer: Self-pay | Admitting: Pulmonary Disease

## 2022-08-12 NOTE — Progress Notes (Signed)
Remote ICD transmission.   

## 2022-09-07 ENCOUNTER — Other Ambulatory Visit: Payer: Self-pay

## 2022-09-08 ENCOUNTER — Other Ambulatory Visit: Payer: Self-pay

## 2022-09-16 ENCOUNTER — Other Ambulatory Visit: Payer: Self-pay

## 2022-09-16 ENCOUNTER — Ambulatory Visit (HOSPITAL_BASED_OUTPATIENT_CLINIC_OR_DEPARTMENT_OTHER): Payer: Medicaid Other | Admitting: Internal Medicine

## 2022-09-16 ENCOUNTER — Encounter: Payer: Self-pay | Admitting: Internal Medicine

## 2022-09-16 ENCOUNTER — Other Ambulatory Visit
Admission: RE | Admit: 2022-09-16 | Discharge: 2022-09-16 | Disposition: A | Discharge status: Home / Self Care | Payer: Medicaid Other | Source: Ambulatory Visit | Attending: Internal Medicine | Admitting: Internal Medicine

## 2022-09-16 VITALS — BP 127/94 | HR 73 | Wt 209.4 lb

## 2022-09-16 DIAGNOSIS — I2510 Atherosclerotic heart disease of native coronary artery without angina pectoris: Secondary | ICD-10-CM

## 2022-09-16 DIAGNOSIS — I5022 Chronic systolic (congestive) heart failure: Secondary | ICD-10-CM | POA: Diagnosis present

## 2022-09-16 LAB — BASIC METABOLIC PANEL
Anion gap: 9 (ref 5–15)
BUN: 24 mg/dL — ABNORMAL HIGH (ref 8–23)
CO2: 22 mmol/L (ref 22–32)
Calcium: 8.5 mg/dL — ABNORMAL LOW (ref 8.9–10.3)
Chloride: 104 mmol/L (ref 98–111)
Creatinine, Ser: 1.22 mg/dL (ref 0.61–1.24)
GFR, Estimated: >60 mL/min (ref >60)
Glucose, Bld: 210 mg/dL — ABNORMAL HIGH (ref 70–99)
Potassium: 4.1 mmol/L (ref 3.5–5.1)
Sodium: 135 mmol/L (ref 135–145)

## 2022-09-16 MED ORDER — SPIRONOLACTONE 25 MG PO TABS
12.5000 mg | ORAL_TABLET | Freq: Every day | ORAL | 3 refills | Status: DC
  Filled 2022-09-16: qty 45, 90d supply, fill #0

## 2022-09-16 MED ORDER — POTASSIUM CHLORIDE CRYS ER 20 MEQ PO TBCR
20.0000 meq | EXTENDED_RELEASE_TABLET | Freq: Every day | ORAL | 3 refills | Status: DC
  Filled 2022-09-16 – 2023-01-19 (×2): qty 30, 30d supply, fill #0

## 2022-09-16 NOTE — Patient Instructions (Signed)
START Spironolactone 12.'5mg'$  daily  DECREASE Potassium to 38mq daily  Routine lab work today. Will notify you of abnormal results  Follow up in 3 months  Do the following things EVERYDAY: Weigh yourself in the morning before breakfast. Write it down and keep it in a log. Take your medicines as prescribed Eat low salt foods--Limit salt (sodium) to 2000 mg per day.  Stay as active as you can everyday Limit all fluids for the day to less than 2 liters

## 2022-09-16 NOTE — Progress Notes (Signed)
ADVANCED HF CLINIC NOTE  Referring Physician: Kathlyn Sacramento, MD Primary Care: Patient, No Pcp Per Primary Cardiologist: Kathlyn Sacramento, MD   HPI:  [name]. is a 67 y.o. male with CAD, DM2, PVCs, former smoker, previous CVA, HTN, OSA and chronic systolic HF referred by Dr. Fletcher Anon for further evaluation of his HF.  He has known history of CAD s/p  remote MI with previous LAD,diagonal and RCA stenting at [facility]. Cath 2016 showed patent LAD and diagonal stents and chronically occluded RCA and left to right collaterals.  He also has chronic systolic heart failure due to ischemic cardiomyopathy EF 25-30%  Cath 2/21 with 3v CAD with patent stents in the LAD and diagonal without significant restenosis, chronically occluded RCA stents with right to right and left-to-right collaterals and significant stenosis in the distal left circumflex supplying a relatively small OM 3 distribution.  Ejection fraction was 25 to 30%.  Right heart catheterization showed normal filling pressures, mild pulmonary hypertension and normal cardiac output.  Medical therapy was recommended.  Had NSTEMI 4/21 Cath showed occluded distal left circumflex which was treated successfully with PCI and drug-eluting stent placement.     He was admitted 6/23 with chest pain and heart failure.  Echo EF 30-35%.  Cath was done showed patent LAD and left circumflex stents with chronically occluded right coronary artery with left-to-right collaterals.  Medical therapy was recommended.  The patient did not fill his prescriptions.  He returned to the emergency room after he was stung by multiple yellow jackets and was found to be in ventricular tachycardia with a heart rate of 175 bpm.  He was cardioverted with 120 J shock.  He went back into ventricular tachycardia and was treated successfully with IV amiodarone.     Admitted 7/23 with VTin the setting of not taking amiodarone as prescribed.  He ultimately underwent ICD placement by Dr.  Quentin Ore in October.  I saw him for the first time in 12/23. He was not taking Entresto regularly because it dragged him down. Stopped entresto and added eplerenone. He stopped eplerenone because it made him feel anxious and he couldn't sleep. I previously switched to losartan as well as sertraline for anxiety  Sleep study 1/24 with mild OSA (AHI 9)   Here for f/u. Says he just feels tired. Not interested in doing things. Says breathing is better with inhalers. No CP, edema, orthopnea or PND. Not tolerating CPAP well. Works as a Firefighter   Past Medical History:  Diagnosis Date   Anginal pain ([place])    Arthritis    CAD (coronary artery disease)    a. s/p MI with LAD and Diag stenting @ [name];  b. 06/2015 Cath: LAD 38md ISR, 100 RCA (ISR) w/ L->R collats, otw mod nonobs dzs-->Med Rx; c. 08/2019 Cath: LM nl, LAD 10p/m ISR, D1 20, D2 100, RI min irregs, LCX 86m, OM1 100, OM2 50, RCA 100p, 70d. RPDA fills via collats from LAD. EF 25-35%-->Med Rx; d. 10/2019 NSTEMI/Cath: LCX now 100 (2.75x15 Resolute Onyx DES), otw stable compared to 08/2019.   Chronic combined systolic and diastolic CHF (congestive heart failure) ([place]   a. 06/2015 Echo: EF 20-25%, Gr3 DD; b. 10/2019 Echo: EF 25-30%, glob HK, sev inf/infapical HK. Mod dil LA.   Dyspnea    Essential hypertension    Headache 12/23/2021   Hyperlipidemia    Hypokalemia    a. 06/2015 in setting of diuresis.   Ischemic cardiomyopathy    a.  2011 EF 45% ([name]);  b. 06/2015 Echo: EF 20-25%; c. 04/2019 Echo: EF 40-45%; d. 08/2019 LV gram: EF 25-35%; e. 10/2019 Echo: EF 25-30%.   PVC's (premature ventricular contractions)    a. 09/2019 Zio (3 days): Avg hr 70, 4 runs NSVT, 5 runs SVT, rare PACs, frequent PVCs w/ 11.8% burden.   Sleep apnea    Stroke [facility])    Stroke/Right temporal lobe infarction [facility])    a. 10/2019 MRI brain: 1cm acute ischemic nonhemorrhagic R temporal lobe infarct. Age-related cerebral atrophy w/ moderate chronic small vessel ischemic  dzs.   Type 2 diabetes mellitus with hyperglycemia ([place]) 10/08/2019    Current Outpatient Medications  Medication Sig Dispense Refill   acetaminophen (TYLENOL) 500 MG tablet Take 1,000 mg by mouth as needed.     amiodarone (PACERONE) 200 MG tablet Take 1 tablet (200 mg total) by mouth daily. 90 tablet 1   aspirin EC 81 MG tablet Take 1 tablet (81 mg total) by mouth daily. Swallow whole. 90 tablet 3   carvedilol (COREG) 6.25 MG tablet Take 1 tablet (6.25 mg total) by mouth 2 (two) times daily with a meal. 60 tablet 3   ezetimibe (ZETIA) 10 MG tablet Take 1 tablet (10 mg total) by mouth daily. 90 tablet 3   furosemide (LASIX) 40 MG tablet Take 1 tablet (40 mg total) by mouth daily. 30 tablet 3   gabapentin (NEURONTIN) 100 MG capsule Take 200 mg by mouth at bedtime.     losartan (COZAAR) 50 MG tablet Take 1 tablet (50 mg total) by mouth in the morning and at bedtime. 60 tablet 3   methocarbamol (ROBAXIN) 500 MG tablet Take 1 tablet (500 mg total) by mouth 3 (three) times daily as needed. 90 tablet 0   phenylephrine (NEO-SYNEPHRINE) 0.5 % nasal solution Place 1 drop into both nostrils at bedtime.     potassium chloride SA (KLOR-CON M) 20 MEQ tablet Take 2 tablets (40 mEq total) by mouth daily. 70 tablet 3   sertraline (ZOLOFT) 50 MG tablet Take 0.5 tablets (25 mg total) by mouth daily for 7 days, THEN 1 tablet (50 mg total) daily. 93.5 tablet 0   sodium chloride (OCEAN) 0.65 % SOLN nasal spray Place 1 spray into both nostrils as needed for congestion.     Tiotropium Bromide-Olodaterol (STIOLTO RESPIMAT) 2.5-2.5 MCG/ACT AERS Inhale 2 puffs into the lungs daily. 4 g 0   nitroGLYCERIN (NITROSTAT) 0.4 MG SL tablet Place 1 tablet (0.4 mg total) under the tongue every 5 (five) minutes as needed for chest pain. 90 tablet 3   No current facility-administered medications for this visit.    Allergies  Allergen Reactions   Contrast Media [Iodinated Contrast Media] Shortness Of Breath   Iohexol Shortness  Of Breath     Onset Date: [id number]    Jardiance [Empagliflozin] Other (See Comments)    Fatigue/weakness   Atorvastatin Other (See Comments)    Myalgias    Hydrocodone Itching   Morphine And Related Other (See Comments)    Lost control    Penicillins Other (See Comments)    Unknown reaction Did it involve swelling of the face/tongue/throat, SOB, or low BP? Unknown Did it involve sudden or severe rash/hives, skin peeling, or any reaction on the inside of your mouth or nose? Unknown Did you need to seek medical attention at a hospital or doctor's office? Yes When did it last happen?      Childhood allergy  If all above answers are "NO",  may proceed with cephalosporin use.    Rosuvastatin Other (See Comments)    Myalgias       Social History   Socioeconomic History   Marital status: Widowed    Spouse name: Not on file   Number of children: 2   Years of education: Not on file   Highest education level: GED or equivalent  Occupational History   Occupation: Psychiatrist   Tobacco Use   Smoking status: Former    Types: Cigarettes    Quit date: 06/10/2011    Years since quitting: 11.2   Smokeless tobacco: Never  Substance and Sexual Activity   Alcohol use: No    Alcohol/week: 0.0 standard drinks of alcohol   Drug use: No   Sexual activity: Not Currently  Other Topics Concern   Not on file  Social History Narrative   Lives alone.   Social Determinants of Health   Financial Resource Strain: Low Risk  (04/30/2019)   Overall Financial Resource Strain (CARDIA)    Difficulty of Paying Living Expenses: Not hard at all  Food Insecurity: No Food Insecurity (04/30/2019)   Hunger Vital Sign    Worried About Running Out of Food in the Last Year: Never true    Ran Out of Food in the Last Year: Never true  Transportation Needs: No Transportation Needs (04/30/2019)   PRAPARE - Hydrologist (Medical): No    Lack of Transportation (Non-Medical): No   Physical Activity: Inactive (04/30/2019)   Exercise Vital Sign    Days of Exercise per Week: 0 days    Minutes of Exercise per Session: 0 min  Stress: No Stress Concern Present (04/30/2019)   [place]    Feeling of Stress : Not at all  Social Connections: Moderately Isolated (04/30/2019)   Social Connection and Isolation Panel [NHANES]    Frequency of Communication with Friends and Family: More than three times a week    Frequency of Social Gatherings with Friends and Family: More than three times a week    Attends Religious Services: Never    Marine scientist or Organizations: Yes    Attends Archivist Meetings: Never    Marital Status: Divorced  Human resources officer Violence: Not At Risk (04/30/2019)   Humiliation, Afraid, Rape, and Kick questionnaire    Fear of Current or Ex-Partner: No    Emotionally Abused: No    Physically Abused: No    Sexually Abused: No      Family History  Problem Relation Age of Onset   Coronary artery disease Mother    Diabetes Mother    Coronary artery disease Father     Vitals:   09/16/22 1059  BP: (!) 127/94  Pulse: 73  SpO2: 96%  Weight: 209 lb 6.4 oz (95 kg)     PHYSICAL EXAM: General:  Well appearing. No resp difficulty HEENT: normal Neck: supple. no JVD. Carotids 2+ bilat; no bruits. No lymphadenopathy or thryomegaly appreciated. Cor: PMI nondisplaced. Regular rate & rhythm. No rubs, gallops or murmurs. Lungs: clear Abdomen: obese soft, nontender, nondistended. No hepatosplenomegaly. No bruits or masses. Good bowel sounds. Extremities: no cyanosis, clubbing, rash, edema Neuro: alert & orientedx3, cranial nerves grossly intact. moves all 4 extremities w/o difficulty. Affect pleasant  ICD interrogation: No VT/AF. Optivol ok. Activity level 3-4hr/day Personally reviewed   ASSESSMENT & PLAN:  1.  Chronic systolic heart failure due to iCM - Echo 6/23  EF 30-35%.   - s/p MDT ICD - Stable/improved NYHA II-III - Volume status looks ok on exam and Optivol - Continue lasix 40 daily.  - Continue carvedilol to 6.25 bid - Continue losartan 50 bid - Try a low-dose spiro 12.5 daily - Unable to tolerate eplerenone due to anxiety. Will hold for now - Unable to tolerate Entresto due to fatigue - He did not tolerate Jardiance in the past due to fatigue and weakness.  He does not want to try another SGLT2 inhibitor. - Drug tolerance now improving. Hopefully we can get him on 3/4 GDMT agents  2. CAD  - s/p remote MI. S/p PCI LAD, LCx and RCA - Cath 6/23: patent LAD and left circumflex stents with chronically occluded right coronary artery with left-to-right collaterals.  Medical therapy was recommended. -No s/s angina - Continue DAPT. He doesn't tolerate statin - Follows with Dr. Fletcher Anon   3  Essential hypertension:  - Blood pressure well controlled. Continue current regimen.  4. H/o ventricular tachycardia: - Status post ICD placement.   - continue amio  - No VT on ICD  5. H/o PVCs - Zio 3/21: Avg hr 70, 4 runs NSVT, 5 runs SVT, rare PACs, frequent PVCs w/ 11.8% burden. - consider repeat  6. Mild OSA - Sleep study 1/24 with mild OSA (AHI 9)   7. Anxiety - Symptoms improved with  Zoloft 50    Glori Bickers, MD  11:29 AM

## 2022-09-20 ENCOUNTER — Other Ambulatory Visit: Payer: Self-pay

## 2022-09-20 ENCOUNTER — Encounter: Payer: Self-pay | Admitting: Pulmonary Disease

## 2022-09-20 ENCOUNTER — Ambulatory Visit: Payer: Medicaid Other | Admitting: Pulmonary Disease

## 2022-09-20 ENCOUNTER — Encounter: Payer: Self-pay | Admitting: Cardiology

## 2022-09-20 VITALS — BP 160/90 | HR 68 | Temp 97.6°F | Ht 66.0 in | Wt 207.2 lb

## 2022-09-20 DIAGNOSIS — I5022 Chronic systolic (congestive) heart failure: Secondary | ICD-10-CM

## 2022-09-20 DIAGNOSIS — G4733 Obstructive sleep apnea (adult) (pediatric): Secondary | ICD-10-CM

## 2022-09-20 DIAGNOSIS — J449 Chronic obstructive pulmonary disease, unspecified: Secondary | ICD-10-CM

## 2022-09-20 DIAGNOSIS — I255 Ischemic cardiomyopathy: Secondary | ICD-10-CM | POA: Diagnosis not present

## 2022-09-20 MED ORDER — STIOLTO RESPIMAT 2.5-2.5 MCG/ACT IN AERS: 2.0000 | INHALATION_SPRAY | Freq: Every day | RESPIRATORY_TRACT | 0 refills | Status: DC

## 2022-09-20 MED ORDER — STIOLTO RESPIMAT 2.5-2.5 MCG/ACT IN AERS
2.0000 | INHALATION_SPRAY | Freq: Every day | RESPIRATORY_TRACT | 11 refills | Status: DC
  Filled 2022-09-20: qty 4, 30d supply, fill #0
  Filled 2023-01-20: qty 4, 30d supply, fill #1
  Filled 2023-02-14: qty 4, 30d supply, fill #2
  Filled 2023-03-30: qty 4, 30d supply, fill #3
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
## 2022-09-20 NOTE — Patient Instructions (Signed)
We have provided you with samples of the Stiolto.  A prescription has been sent to your pharmacy.  We have sent in an order for mask fitting at [facility] sleep lab.  We will see you in follow-up in 2 to 3 months time call sooner should any new problems arise.

## 2022-09-28 ENCOUNTER — Other Ambulatory Visit: Payer: Self-pay

## 2022-10-01 ENCOUNTER — Other Ambulatory Visit: Payer: Self-pay

## 2022-10-01 ENCOUNTER — Other Ambulatory Visit: Payer: Self-pay | Admitting: Internal Medicine

## 2022-10-01 MED FILL — Sertraline HCl Tab 50 MG: ORAL | 90 days supply | Qty: 90 | Fill #0 | Status: AC

## 2022-10-04 ENCOUNTER — Other Ambulatory Visit: Payer: Self-pay

## 2022-10-06 ENCOUNTER — Other Ambulatory Visit (HOSPITAL_BASED_OUTPATIENT_CLINIC_OR_DEPARTMENT_OTHER): Payer: Medicaid Other

## 2022-10-20 ENCOUNTER — Other Ambulatory Visit: Payer: Self-pay

## 2022-10-20 MED ORDER — GABAPENTIN 300 MG PO CAPS
300.0000 mg | ORAL_CAPSULE | Freq: Every day | ORAL | 1 refills | Status: DC
  Filled 2022-10-20: qty 90, 90d supply, fill #0

## 2022-11-22 ENCOUNTER — Ambulatory Visit: Payer: Medicaid Other | Admitting: Pulmonary Disease

## 2022-11-23 ENCOUNTER — Other Ambulatory Visit: Payer: Self-pay

## 2022-11-30 ENCOUNTER — Encounter: Payer: Self-pay | Admitting: Cardiovascular Disease

## 2022-11-30 ENCOUNTER — Other Ambulatory Visit: Payer: Self-pay

## 2022-11-30 ENCOUNTER — Ambulatory Visit: Payer: Medicaid Other | Attending: Cardiovascular Disease | Admitting: Cardiovascular Disease

## 2022-11-30 VITALS — BP 148/80 | HR 67 | Ht 66.0 in | Wt 211.0 lb

## 2022-11-30 DIAGNOSIS — I5022 Chronic systolic (congestive) heart failure: Secondary | ICD-10-CM

## 2022-11-30 DIAGNOSIS — E785 Hyperlipidemia, unspecified: Secondary | ICD-10-CM

## 2022-11-30 DIAGNOSIS — I255 Ischemic cardiomyopathy: Secondary | ICD-10-CM | POA: Diagnosis not present

## 2022-11-30 DIAGNOSIS — I10 Essential (primary) hypertension: Secondary | ICD-10-CM | POA: Diagnosis not present

## 2022-11-30 DIAGNOSIS — I2510 Atherosclerotic heart disease of native coronary artery without angina pectoris: Secondary | ICD-10-CM | POA: Diagnosis not present

## 2022-11-30 MED ORDER — LOSARTAN POTASSIUM 50 MG PO TABS
50.0000 mg | ORAL_TABLET | Freq: Every day | ORAL | 3 refills | Status: DC
  Filled 2022-11-30: qty 90, 90d supply, fill #0

## 2022-11-30 NOTE — Patient Instructions (Signed)
Medication Instructions:  RESUME the Losartan at 50 mg once daily  *If you need a refill on your cardiac medications before your next appointment, please call your pharmacy*   Lab Work: None ordered If you have labs (blood work) drawn today and your tests are completely normal, you will receive your results only by: MyChart Message (if you have MyChart) OR A paper copy in the mail If you have any lab test that is abnormal or we need to change your treatment, we will call you to review the results.   Testing/Procedures: None ordered   Follow-Up: At [facility], you and your health needs are our priority.  As part of our continuing mission to provide you with exceptional heart care, we have created designated Provider Care Teams.  These Care Teams include your primary Cardiologist (physician) and Advanced Practice Providers (APPs -  Physician Assistants and Nurse Practitioners) who all work together to provide you with the care you need, when you need it.  We recommend signing up for the patient portal called "MyChart".  Sign up information is provided on this After Visit Summary.  MyChart is used to connect with patients for Virtual Visits (Telemedicine).  Patients are able to view lab/test results, encounter notes, upcoming appointments, etc.  Non-urgent messages can be sent to your provider as well.   To learn more about what you can do with MyChart, go to [email or web address].    Your next appointment:   12 month(s)  Provider:   You may see Lorine Bears, MD or one of the following Advanced Practice Providers on your designated Care Team:   Nicolasa Ducking, NP Eula Listen, PA-C Cadence Fransico Michael, PA-C Charlsie Quest, NP
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
## 2022-12-06 NOTE — Progress Notes (Unsigned)
ADVANCED HF CLINIC NOTE  Referring Physician: Lorine Bears, MD Primary Care: Patient, No Pcp Per Primary Cardiologist: Lorine Bears, MD   HPI:  [name]. is a 67 y.o. male with CAD, DM2, PVCs, former smoker, previous CVA, HTN, OSA and chronic systolic HF referred by Dr. Kirke Corin for further evaluation of his HF.  He has known history of CAD s/p  remote MI with previous LAD,diagonal and RCA stenting at [facility]. Cath 2016 showed patent LAD and diagonal stents and chronically occluded RCA and left to right collaterals.  He also has chronic systolic heart failure due to ischemic cardiomyopathy EF 25-30%  Cath 2/21 with 3v CAD with patent stents in the LAD and diagonal without significant restenosis, chronically occluded RCA stents with right to right and left-to-right collaterals and significant stenosis in the distal left circumflex supplying a relatively small OM 3 distribution.  Ejection fraction was 25 to 30%.  Right heart catheterization showed normal filling pressures, mild pulmonary hypertension and normal cardiac output.  Medical therapy was recommended.  Had NSTEMI 4/21 Cath showed occluded distal left circumflex which was treated successfully with PCI and drug-eluting stent placement.     He was admitted 6/23 with chest pain and heart failure.  Echo EF 30-35%.  Cath showed patent LAD and LCx stents with chronically occluded RCA with L-to-R collaterals.  Medical therapy recommended. He did not fill his prescriptions.  He returned to the ED after he was stung by multiple yellow jackets and was found to be in VT at 175 bpm.  He was cardioverted with 120 J.  He went back into VT and was treated successfully with IV amiodarone.     Admitted 7/23 with VT in the setting of not taking amiodarone as prescribed.  He ultimately underwent ICD placement by Dr. Lalla Brothers in October.  I saw him for the first time in 12/23. He was not taking Entresto regularly because it dragged him down. Stopped  entresto and added eplerenone. He stopped eplerenone because it made him feel anxious and he couldn't sleep. I started losartan as well as sertraline for anxiety  Sleep study 1/24 with mild OSA (AHI 9)   Here for f/u. Says he just feels tired. Not interested in doing things. Says breathing is better with inhalers. No CP, edema, orthopnea or PND. Not tolerating CPAP well. Works as a Environmental education officer   Past Medical History:  Diagnosis Date   Anginal pain (HCC)    Arthritis    CAD (coronary artery disease)    a. s/p MI with LAD and Diag stenting @ [name];  b. 06/2015 Cath: LAD 23m/d ISR, 100 RCA (ISR) w/ L->R collats, otw mod nonobs dzs-->Med Rx; c. 08/2019 Cath: LM nl, LAD 10p/m ISR, D1 20, D2 100, RI min irregs, LCX 43m/d, OM1 100, OM2 50, RCA 100p, 70d. RPDA fills via collats from LAD. EF 25-35%-->Med Rx; d. 10/2019 NSTEMI/Cath: LCX now 100 (2.75x15 Resolute Onyx DES), otw stable compared to 08/2019.   Chronic combined systolic and diastolic CHF (congestive heart failure) (HCC)    a. 06/2015 Echo: EF 20-25%, Gr3 DD; b. 10/2019 Echo: EF 25-30%, glob HK, sev inf/infapical HK. Mod dil LA.   Dyspnea    Essential hypertension    Headache 12/23/2021   Hyperlipidemia    Hypokalemia    a. 06/2015 in setting of diuresis.   Ischemic cardiomyopathy    a. 2011 EF 45% ([name]);  b. 06/2015 Echo: EF 20-25%; c. 04/2019 Echo: EF 40-45%; d. 08/2019  LV gram: EF 25-35%; e. 10/2019 Echo: EF 25-30%.   PVC's (premature ventricular contractions)    a. 09/2019 Zio (3 days): Avg hr 70, 4 runs NSVT, 5 runs SVT, rare PACs, frequent PVCs w/ 11.8% burden.   Sleep apnea    Stroke [facility])    Stroke/Right temporal lobe infarction [facility])    a. 10/2019 MRI brain: 1cm acute ischemic nonhemorrhagic R temporal lobe infarct. Age-related cerebral atrophy w/ moderate chronic small vessel ischemic dzs.   Type 2 diabetes mellitus with hyperglycemia (HCC) 10/08/2019    Current Outpatient Medications  Medication Sig Dispense Refill    acetaminophen (TYLENOL) 500 MG tablet Take 1,000 mg by mouth as needed.     amiodarone (PACERONE) 200 MG tablet Take 1 tablet (200 mg total) by mouth daily. 90 tablet 1   aspirin EC 81 MG tablet Take 1 tablet (81 mg total) by mouth daily. Swallow whole. 90 tablet 3   carvedilol (COREG) 6.25 MG tablet Take 1 tablet (6.25 mg total) by mouth 2 (two) times daily with a meal. 60 tablet 3   ezetimibe (ZETIA) 10 MG tablet Take 1 tablet (10 mg total) by mouth daily. 90 tablet 3   furosemide (LASIX) 40 MG tablet Take 1 tablet (40 mg total) by mouth daily. 30 tablet 3   gabapentin (NEURONTIN) 100 MG capsule Take 200 mg by mouth at bedtime.     gabapentin (NEURONTIN) 300 MG capsule Take 1 capsule (300 mg total) by mouth at bedtime. 90 capsule 1   losartan (COZAAR) 50 MG tablet Take 1 tablet (50 mg total) by mouth daily. 90 tablet 3   methocarbamol (ROBAXIN) 500 MG tablet Take 1 tablet (500 mg total) by mouth 3 (three) times daily as needed. 90 tablet 0   nitroGLYCERIN (NITROSTAT) 0.4 MG SL tablet Place 1 tablet (0.4 mg total) under the tongue every 5 (five) minutes as needed for chest pain. 90 tablet 3   phenylephrine (NEO-SYNEPHRINE) 0.5 % nasal solution Place 1 drop into both nostrils at bedtime.     potassium chloride SA (KLOR-CON M) 20 MEQ tablet Take 1 tablet (20 mEq total) by mouth daily. 30 tablet 3   sertraline (ZOLOFT) 50 MG tablet Take 1 tablet (50 mg total) by mouth daily. 90 tablet 0   sodium chloride (OCEAN) 0.65 % SOLN nasal spray Place 1 spray into both nostrils as needed for congestion.     spironolactone (ALDACTONE) 25 MG tablet Take 0.5 tablets (12.5 mg total) by mouth daily. 45 tablet 3   Tiotropium Bromide-Olodaterol (STIOLTO RESPIMAT) 2.5-2.5 MCG/ACT AERS Inhale 2 puffs into the lungs daily. 8 g 0   Tiotropium Bromide-Olodaterol (STIOLTO RESPIMAT) 2.5-2.5 MCG/ACT AERS Inhale 2 puffs into the lungs daily. 4 g 11   No current facility-administered medications for this visit.     Allergies  Allergen Reactions   Contrast Media [Iodinated Contrast Media] Shortness Of Breath   Iohexol Shortness Of Breath     Onset Date: [id number]    Jardiance [Empagliflozin] Other (See Comments)    Fatigue/weakness   Atorvastatin Other (See Comments)    Myalgias    Hydrocodone Itching   Morphine And Codeine Other (See Comments)    Lost control    Penicillins Other (See Comments)    Unknown reaction Did it involve swelling of the face/tongue/throat, SOB, or low BP? Unknown Did it involve sudden or severe rash/hives, skin peeling, or any reaction on the inside of your mouth or nose? Unknown Did you need to seek medical attention  at a hospital or doctor's office? Yes When did it last happen?      Childhood allergy  If all above answers are "NO", may proceed with cephalosporin use.    Rosuvastatin Other (See Comments)    Myalgias       Social History   Socioeconomic History   Marital status: Widowed    Spouse name: Not on file   Number of children: 2   Years of education: Not on file   Highest education level: GED or equivalent  Occupational History   Occupation: Statistician   Tobacco Use   Smoking status: Former    Types: Cigarettes    Quit date: 06/10/2011    Years since quitting: 11.4   Smokeless tobacco: Never  Substance and Sexual Activity   Alcohol use: No    Alcohol/week: 0.0 standard drinks of alcohol   Drug use: No   Sexual activity: Not Currently  Other Topics Concern   Not on file  Social History Narrative   Lives alone.   Social Determinants of Health   Financial Resource Strain: Low Risk  (04/30/2019)   Overall Financial Resource Strain (CARDIA)    Difficulty of Paying Living Expenses: Not hard at all  Food Insecurity: No Food Insecurity (04/30/2019)   Hunger Vital Sign    Worried About Running Out of Food in the Last Year: Never true    Ran Out of Food in the Last Year: Never true  Transportation Needs: No Transportation Needs  (04/30/2019)   PRAPARE - Administrator, Civil Service (Medical): No    Lack of Transportation (Non-Medical): No  Physical Activity: Inactive (04/30/2019)   Exercise Vital Sign    Days of Exercise per Week: 0 days    Minutes of Exercise per Session: 0 min  Stress: No Stress Concern Present (04/30/2019)   [employer] of Occupational Health - Occupational Stress Questionnaire    Feeling of Stress : Not at all  Social Connections: Moderately Isolated (04/30/2019)   Social Connection and Isolation Panel [NHANES]    Frequency of Communication with Friends and Family: More than three times a week    Frequency of Social Gatherings with Friends and Family: More than three times a week    Attends Religious Services: Never    Database administrator or Organizations: Yes    Attends Banker Meetings: Never    Marital Status: Divorced  Catering manager Violence: Not At Risk (04/30/2019)   Humiliation, Afraid, Rape, and Kick questionnaire    Fear of Current or Ex-Partner: No    Emotionally Abused: No    Physically Abused: No    Sexually Abused: No      Family History  Problem Relation Age of Onset   Coronary artery disease Mother    Diabetes Mother    Coronary artery disease Father     There were no vitals filed for this visit.    PHYSICAL EXAM: General:  Well appearing. No resp difficulty HEENT: normal Neck: supple. no JVD. Carotids 2+ bilat; no bruits. No lymphadenopathy or thryomegaly appreciated. Cor: PMI nondisplaced. Regular rate & rhythm. No rubs, gallops or murmurs. Lungs: clear Abdomen: obese soft, nontender, nondistended. No hepatosplenomegaly. No bruits or masses. Good bowel sounds. Extremities: no cyanosis, clubbing, rash, edema Neuro: alert & orientedx3, cranial nerves grossly intact. moves all 4 extremities w/o difficulty. Affect pleasant  ICD interrogation: No VT/AF. Optivol ok. Activity level 3-4hr/day Personally  reviewed   ASSESSMENT & PLAN:  1.  Chronic systolic heart failure due to iCM - Echo 6/23 EF 30-35%.   - s/p MDT ICD - Stable/improved NYHA II-III - Volume status looks ok on exam and Optivol - Continue lasix 40 daily.  - Continue carvedilol to 6.25 bid - Continue losartan 50 bid - Try a low-dose spiro 12.5 daily - Unable to tolerate eplerenone due to anxiety. Will hold for now - Unable to tolerate Entresto due to fatigue - He did not tolerate Jardiance in the past due to fatigue and weakness.  He does not want to try another SGLT2 inhibitor. - Drug tolerance now improving. Hopefully we can get him on 3/4 GDMT agents  2. CAD  - s/p remote MI. S/p PCI LAD, LCx and RCA - Cath 6/23: patent LAD and left circumflex stents with chronically occluded right coronary artery with left-to-right collaterals.  Medical therapy was recommended. -No s/s angina - Continue DAPT. He doesn't tolerate statin - Follows with Dr. Kirke Corin   3  Essential hypertension:  - Blood pressure well controlled. Continue current regimen.  4. H/o ventricular tachycardia: - Status post ICD placement.   - continue amio  - No VT on ICD  5. H/o PVCs - Zio 3/21: Avg hr 70, 4 runs NSVT, 5 runs SVT, rare PACs, frequent PVCs w/ 11.8% burden. - consider repeat  6. Mild OSA - Sleep study 1/24 with mild OSA (AHI 9)   7. Anxiety - Symptoms improved with  Zoloft 50    Arvilla Meres, MD  10:34 PM

## 2022-12-07 ENCOUNTER — Ambulatory Visit (HOSPITAL_BASED_OUTPATIENT_CLINIC_OR_DEPARTMENT_OTHER): Payer: Medicaid Other | Admitting: Internal Medicine

## 2022-12-07 ENCOUNTER — Ambulatory Visit (INDEPENDENT_AMBULATORY_CARE_PROVIDER_SITE_OTHER): Payer: Medicaid Other

## 2022-12-07 ENCOUNTER — Encounter: Payer: Self-pay | Admitting: Internal Medicine

## 2022-12-07 ENCOUNTER — Other Ambulatory Visit
Admission: RE | Admit: 2022-12-07 | Discharge: 2022-12-07 | Disposition: A | Discharge status: Home / Self Care | Payer: Medicaid Other | Source: Ambulatory Visit | Attending: Internal Medicine | Admitting: Internal Medicine

## 2022-12-07 ENCOUNTER — Other Ambulatory Visit: Payer: Self-pay

## 2022-12-07 VITALS — BP 129/80 | HR 73 | Wt 208.4 lb

## 2022-12-07 DIAGNOSIS — I493 Ventricular premature depolarization: Secondary | ICD-10-CM | POA: Diagnosis not present

## 2022-12-07 DIAGNOSIS — I2510 Atherosclerotic heart disease of native coronary artery without angina pectoris: Secondary | ICD-10-CM | POA: Diagnosis not present

## 2022-12-07 DIAGNOSIS — I472 Ventricular tachycardia, unspecified: Secondary | ICD-10-CM | POA: Diagnosis not present

## 2022-12-07 DIAGNOSIS — I5022 Chronic systolic (congestive) heart failure: Secondary | ICD-10-CM | POA: Diagnosis present

## 2022-12-07 DIAGNOSIS — I10 Essential (primary) hypertension: Secondary | ICD-10-CM

## 2022-12-07 LAB — BASIC METABOLIC PANEL
Anion gap: 6 (ref 5–15)
BUN: 16 mg/dL (ref 8–23)
CO2: 25 mmol/L (ref 22–32)
Calcium: 8.7 mg/dL — ABNORMAL LOW (ref 8.9–10.3)
Chloride: 108 mmol/L (ref 98–111)
Creatinine, Ser: 1.03 mg/dL (ref 0.61–1.24)
GFR, Estimated: >60 mL/min (ref >60)
Glucose, Bld: 139 mg/dL — ABNORMAL HIGH (ref 70–99)
Potassium: 3.6 mmol/L (ref 3.5–5.1)
Sodium: 139 mmol/L (ref 135–145)

## 2022-12-07 LAB — BRAIN NATRIURETIC PEPTIDE: B Natriuretic Peptide: 604 pg/mL — ABNORMAL HIGH (ref 0.0–100.0)

## 2022-12-07 MED ORDER — FUROSEMIDE 40 MG PO TABS
40.0000 mg | ORAL_TABLET | Freq: Every day | ORAL | 3 refills | Status: DC
  Filled 2022-12-07: qty 90, 90d supply, fill #0

## 2022-12-07 NOTE — Progress Notes (Signed)
   12/07/22 1014  ReDS Vest / Clip  Station Marker D  Ruler Value 35  ReDS Value Range < 36  ReDS Actual Value 34

## 2022-12-07 NOTE — Patient Instructions (Signed)
Medication Changes:  Please take you Lasix 40 mg (1 tablet) daily  Lab Work:  Labs done today, your results will be available in MyChart, we will contact you for abnormal readings.   Testing/Procedures:  Your physician has requested that you have an echocardiogram. Echocardiography is a painless test that uses sound waves to create images of your heart. It provides your doctor with information about the size and shape of your heart and how well your heart's chambers and valves are working. This procedure takes approximately one hour. There are no restrictions for this procedure. Please do NOT wear cologne, perfume, aftershave, or lotions (deodorant is allowed). Please arrive 15 minutes prior to your appointment time.     Special Instructions // Education: Do the following things EVERYDAY: Weigh yourself in the morning before breakfast. Write it down and keep it in a log. Take your medicines as prescribed Eat low salt foods--Limit salt (sodium) to 2000 mg per day.  Stay as active as you can everyday Limit all fluids for the day to less than 2 liters   Follow-Up in: follow up with Dr. Gala Romney in two months.     If you have any questions or concerns before your next appointment please send [place] a message through [place] or call our office at [phone] Monday-Friday 8 am-5 pm.   If you have an urgent need after hours on the weekend please call your Primary Cardiologist or the [facility] in [place] at [phone].

## 2022-12-08 LAB — CUP PACEART REMOTE DEVICE CHECK
Battery Remaining Longevity: 109 mo
Battery Voltage: 3.02 V
Brady Statistic AP VP Percent: 0.04 %
Brady Statistic AP VS Percent: 66.51 %
Brady Statistic AS VP Percent: 0.01 %
Brady Statistic AS VS Percent: 33.44 %
Brady Statistic RA Percent Paced: 66.32 %
Brady Statistic RV Percent Paced: 0.05 %
Date Time Interrogation Session: 20240604090606
HighPow Impedance: 67 Ohm
Implantable Lead Connection Status: 753985
Implantable Lead Connection Status: 753985
Implantable Lead Implant Date: 20231004
Implantable Lead Implant Date: 20231004
Implantable Lead Location: 753859
Implantable Lead Location: 753860
Implantable Lead Model: 5076
Implantable Pulse Generator Implant Date: 20231004
Lead Channel Impedance Value: 380 Ohm
Lead Channel Impedance Value: 456 Ohm
Lead Channel Impedance Value: 494 Ohm
Lead Channel Pacing Threshold Amplitude: 0.625 V
Lead Channel Pacing Threshold Amplitude: 1 V
Lead Channel Pacing Threshold Pulse Width: 0.4 ms
Lead Channel Pacing Threshold Pulse Width: 0.4 ms
Lead Channel Sensing Intrinsic Amplitude: 2 mV
Lead Channel Sensing Intrinsic Amplitude: 2 mV
Lead Channel Sensing Intrinsic Amplitude: 25.625 mV
Lead Channel Sensing Intrinsic Amplitude: 25.625 mV
Lead Channel Setting Pacing Amplitude: 1.5 V
Lead Channel Setting Pacing Amplitude: 1.5 V
Lead Channel Setting Pacing Pulse Width: 0.4 ms
Lead Channel Setting Sensing Sensitivity: 0.3 mV
Zone Setting Status: 755011

## 2022-12-09 ENCOUNTER — Other Ambulatory Visit: Payer: Self-pay

## 2022-12-10 ENCOUNTER — Other Ambulatory Visit: Payer: Self-pay

## 2022-12-14 ENCOUNTER — Other Ambulatory Visit: Payer: Self-pay

## 2022-12-30 NOTE — Progress Notes (Signed)
Remote ICD transmission.   

## 2023-01-12 ENCOUNTER — Ambulatory Visit: Admission: RE | Admit: 2023-01-12 | Payer: Medicaid Other | Source: Ambulatory Visit

## 2023-01-19 ENCOUNTER — Other Ambulatory Visit: Payer: Self-pay

## 2023-01-19 ENCOUNTER — Other Ambulatory Visit (HOSPITAL_COMMUNITY): Payer: Self-pay

## 2023-01-20 ENCOUNTER — Other Ambulatory Visit: Payer: Self-pay

## 2023-02-03 ENCOUNTER — Ambulatory Visit
Admission: RE | Admit: 2023-02-03 | Discharge: 2023-02-03 | Disposition: A | Discharge status: Home / Self Care | Payer: Medicaid Other | Source: Ambulatory Visit | Attending: Internal Medicine | Admitting: Internal Medicine

## 2023-02-03 DIAGNOSIS — G4730 Sleep apnea, unspecified: Secondary | ICD-10-CM | POA: Diagnosis not present

## 2023-02-03 DIAGNOSIS — I5022 Chronic systolic (congestive) heart failure: Secondary | ICD-10-CM | POA: Insufficient documentation

## 2023-02-03 DIAGNOSIS — I080 Rheumatic disorders of both mitral and aortic valves: Secondary | ICD-10-CM | POA: Insufficient documentation

## 2023-02-03 DIAGNOSIS — J449 Chronic obstructive pulmonary disease, unspecified: Secondary | ICD-10-CM | POA: Diagnosis not present

## 2023-02-03 DIAGNOSIS — E119 Type 2 diabetes mellitus without complications: Secondary | ICD-10-CM | POA: Diagnosis not present

## 2023-02-03 DIAGNOSIS — Z8673 Personal history of transient ischemic attack (TIA), and cerebral infarction without residual deficits: Secondary | ICD-10-CM | POA: Diagnosis not present

## 2023-02-03 DIAGNOSIS — R000 Tachycardia, unspecified: Secondary | ICD-10-CM | POA: Insufficient documentation

## 2023-02-03 DIAGNOSIS — I25119 Atherosclerotic heart disease of native coronary artery with unspecified angina pectoris: Secondary | ICD-10-CM | POA: Insufficient documentation

## 2023-02-03 DIAGNOSIS — I110 Hypertensive heart disease with heart failure: Secondary | ICD-10-CM | POA: Insufficient documentation

## 2023-02-03 DIAGNOSIS — E785 Hyperlipidemia, unspecified: Secondary | ICD-10-CM | POA: Insufficient documentation

## 2023-02-03 DIAGNOSIS — I252 Old myocardial infarction: Secondary | ICD-10-CM | POA: Insufficient documentation

## 2023-02-03 DIAGNOSIS — I429 Cardiomyopathy, unspecified: Secondary | ICD-10-CM | POA: Diagnosis not present

## 2023-02-03 LAB — ECHOCARDIOGRAM COMPLETE
AR max vel: 3.18 cm2
AV Area VTI: 3.21 cm2
AV Area mean vel: 3.11 cm2
AV Mean grad: 3 mmHg
AV Peak grad: 4.9 mmHg
Ao pk vel: 1.11 m/s
Area-P 1/2: 4.83 cm2
Calc EF: 31.4 %
MV M vel: 4.72 m/s
MV Peak grad: 89.1 mmHg
MV VTI: 2.34 cm2
Radius: 0.6 cm
S' Lateral: 5.6 cm
Single Plane A2C EF: 24.7 %
Single Plane A4C EF: 36.5 %

## 2023-02-03 MED ORDER — PERFLUTREN LIPID MICROSPHERE
1.0000 mL | INTRAVENOUS | Status: AC | PRN
  Administered 2023-02-03: 5 mL via INTRAVENOUS

## 2023-02-03 NOTE — Addendum Note (Signed)
Encounter addended by: Carolyne Fiscal on: 02/03/2023 10:19 AM  Actions taken: Imaging Exam begun

## 2023-02-03 NOTE — Addendum Note (Signed)
Encounter addended by: Carolyne Fiscal on: 02/03/2023 12:28 PM  Actions taken: Imaging Exam ended, Clinical Note Signed, Order list changed, MAR administration accepted, Charge Capture section accepted

## 2023-02-03 NOTE — Progress Notes (Signed)
*  PRELIMINARY RESULTS* Echocardiogram 2D Echocardiogram has been performed.  [name] 02/03/2023, 12:26 PM

## 2023-02-14 ENCOUNTER — Other Ambulatory Visit: Payer: Self-pay

## 2023-02-15 ENCOUNTER — Other Ambulatory Visit: Payer: Self-pay

## 2023-02-25 ENCOUNTER — Encounter: Payer: Self-pay | Admitting: Nurse Practitioner

## 2023-02-25 ENCOUNTER — Ambulatory Visit (INDEPENDENT_AMBULATORY_CARE_PROVIDER_SITE_OTHER): Payer: Medicaid Other | Admitting: Nurse Practitioner

## 2023-02-25 ENCOUNTER — Other Ambulatory Visit: Payer: Self-pay

## 2023-02-25 ENCOUNTER — Ambulatory Visit
Admission: RE | Admit: 2023-02-25 | Discharge: 2023-02-25 | Disposition: A | Discharge status: Home / Self Care | Payer: Medicaid Other | Source: Ambulatory Visit | Attending: Nurse Practitioner | Admitting: Nurse Practitioner

## 2023-02-25 ENCOUNTER — Other Ambulatory Visit
Admission: RE | Admit: 2023-02-25 | Discharge: 2023-02-25 | Disposition: A | Discharge status: Home / Self Care | Payer: Medicaid Other | Source: Home / Self Care | Attending: Nurse Practitioner | Admitting: Nurse Practitioner

## 2023-02-25 ENCOUNTER — Ambulatory Visit
Admission: RE | Admit: 2023-02-25 | Discharge: 2023-02-25 | Disposition: A | Discharge status: Home / Self Care | Payer: Medicaid Other | Attending: Nurse Practitioner | Admitting: Nurse Practitioner

## 2023-02-25 VITALS — BP 160/98 | HR 66 | Temp 97.3°F | Ht 66.0 in | Wt 214.2 lb

## 2023-02-25 DIAGNOSIS — J441 Chronic obstructive pulmonary disease with (acute) exacerbation: Secondary | ICD-10-CM

## 2023-02-25 DIAGNOSIS — I509 Heart failure, unspecified: Secondary | ICD-10-CM

## 2023-02-25 DIAGNOSIS — I5023 Acute on chronic systolic (congestive) heart failure: Secondary | ICD-10-CM | POA: Diagnosis not present

## 2023-02-25 DIAGNOSIS — J449 Chronic obstructive pulmonary disease, unspecified: Secondary | ICD-10-CM | POA: Insufficient documentation

## 2023-02-25 LAB — BASIC METABOLIC PANEL
Anion gap: 9 (ref 5–15)
BUN: 14 mg/dL (ref 8–23)
CO2: 22 mmol/L (ref 22–32)
Calcium: 8.7 mg/dL — ABNORMAL LOW (ref 8.9–10.3)
Chloride: 108 mmol/L (ref 98–111)
Creatinine, Ser: 0.93 mg/dL (ref 0.61–1.24)
GFR, Estimated: >60 mL/min (ref >60)
Glucose, Bld: 152 mg/dL — ABNORMAL HIGH (ref 70–99)
Potassium: 3.5 mmol/L (ref 3.5–5.1)
Sodium: 139 mmol/L (ref 135–145)

## 2023-02-25 LAB — BRAIN NATRIURETIC PEPTIDE: B Natriuretic Peptide: 744.6 pg/mL — ABNORMAL HIGH (ref 0.0–100.0)

## 2023-02-25 MED ORDER — PREDNISONE 20 MG PO TABS
40.0000 mg | ORAL_TABLET | Freq: Every day | ORAL | 0 refills | Status: AC
  Filled 2023-02-25: qty 10, 5d supply, fill #0

## 2023-02-25 MED ORDER — ALBUTEROL SULFATE HFA 108 (90 BASE) MCG/ACT IN AERS
2.0000 | INHALATION_SPRAY | Freq: Four times a day (QID) | RESPIRATORY_TRACT | 2 refills | Status: DC | PRN
  Filled 2023-02-25: qty 18, 25d supply, fill #0
  Filled 2023-03-30: qty 18, 25d supply, fill #1
  Filled 2023-06-07: qty 18, 25d supply, fill #2

## 2023-02-25 NOTE — Assessment & Plan Note (Signed)
Possible AECOPD related to dust exposure; although symptoms are more consistent with CHF exacerbation. We will treat him with prednisone burst. Educated on the difference between maintenance and rescue therapies. Provided with PRN SABA. Action plan in place. CXR today to rule out superimposed infection.

## 2023-02-25 NOTE — Patient Instructions (Addendum)
Albuterol inhaler 2 puffs every 6 hours as needed for shortness of breath or wheezing. This is your new rescue inhaler to use if you have trouble with your breathing Continue Stiolto 2 puffs daily. Only use 2 puffs daily of this   Increase lasix to 80 mg daily for the next 5 days. Take in AM Prednisone 40 mg daily for 5 days. Take in AM with food   Chest x ray today  Labs today  Follow up in 3-4 weeks with Dr. Jayme Cloud or Katie Keisa Blow,NP. If symptoms do not improve or worsen, please contact office for sooner follow up or seek emergency care.

## 2023-02-25 NOTE — Assessment & Plan Note (Addendum)
Concern for acute CHF exacerbation. Will check BNP and BMET today. Increase lasix for 5 days. Advised to monitor weights at home. Follow up with cardiology. Strict ED precautions. Close follow up.  Patient Instructions  Albuterol inhaler 2 puffs every 6 hours as needed for shortness of breath or wheezing. This is your new rescue inhaler to use if you have trouble with your breathing Continue Stiolto 2 puffs daily. Only use 2 puffs daily of this   Increase lasix to 80 mg daily for the next 5 days. Take in AM Prednisone 40 mg daily for 5 days. Take in AM with food   Chest x ray today  Labs today  Follow up in 3-4 weeks with Dr. Jayme Cloud or Katie Shalik Sanfilippo,NP. If symptoms do not improve or worsen, please contact office for sooner follow up or seek emergency care.

## 2023-02-25 NOTE — Progress Notes (Signed)
Evidence of fluid overload on his chest x ray. His BNP (hormone heart produces when it has to pump too hard) is also elevated, consistent with HF exacerbation. He should continue recommendations from our visit with increased lasix. I also want him to take an extra potassium pill for the next 5 days (while he's on the higher lasix dose). He will need repeat BMET in a week. He does see cardiology next week. He needs to make them aware of his symptoms and treatment. I will also send them a message. Go to ED if symptoms worsen over the weekend. Thanks.

## 2023-02-25 NOTE — Progress Notes (Signed)
@Patient  ID: Glen Flemings., male    DOB: April 28, 1956, 67 y.o.   MRN: [id number]  Chief Complaint  Patient presents with   Acute Visit    Increased SOB in the last week. No wheezing. Dry cough.     Referring provider: No ref. provider found  HPI: 67 year old male, former smoker followed for COPD and OSA on CPAP.  He is a patient of Dr. Jayme Cloud and last seen in office 09/20/2022.  Past medical history significant for CAD, ischemic cardiomyopathy status post ICD, CHF, hypertension, DM 2, HLD, obesity.  TEST/EVENTS:  04/15/2022 PFT: FVC 73, FEV1 72, ratio 78, TLC 69, DLCOunc 45  07/19/2022 NPSG: AHI 14.5, SpO2 low of 86% 02/03/2023 echo: EF 30-35%. Global hypokinesis. RV size and function nl. Mildly elevated PASP. LA severely dilated. Moderate MR.   09/20/2022: OV with Dr. Jayme Cloud.  History of chronic CHF with EF 30 to 35% and stage I COPD.  Dyspnea out of proportion to his very mild pulmonary impairment.  Likely mostly related to his cardiac disease.  Recently diagnosed with OSA and started on auto CPAP.  Unable to tolerate mask when fitted at the DME.  He was previously given trial of Stiolto which she does feel helps his breathing.  He is interested in inspire device for sleep apnea however does have an ICD in place and reports are mixed as to whether inspire device can interfere with ICD.  No acute symptoms.  Sent for mask fitting at [facility].  02/25/2023: Today-acute Patient presents today for acute visit.  He tells me that over the last week, he has had increased shortness of breath even at rest.  Feels like he cannot get a deep breath in.  He was cutting up wood earlier last week so he's not sure if this had something to do with his symptoms. He is also had a primarily nonproductive cough that feels congested.  Not really able to produce any sputum.  Has not noticed any wheezing but he is also hard of hearing.  He has also had a 7 pound weight gain since he was last seen here and a 6  pound weight gain since he was seen by cardiology in June.  Has noticed that his boots have been a little bit tighter, especially towards the end of the day.  Does find it harder to lay flat. Denies any fevers, chills, hemoptysis, chest pain, palpitations, PND. He has been using Stiolto more than he should - taking 6 puffs a day. Does not have a rescue inhaler.   Allergies  Allergen Reactions   Contrast Media [Iodinated Contrast Media] Shortness Of Breath   Iohexol Shortness Of Breath     Onset Date: [id number]    Jardiance [Empagliflozin] Other (See Comments)    Fatigue/weakness   Atorvastatin Other (See Comments)    Myalgias    Hydrocodone Itching   Morphine And Codeine Other (See Comments)    Lost control    Penicillins Other (See Comments)    Unknown reaction Did it involve swelling of the face/tongue/throat, SOB, or low BP? Unknown Did it involve sudden or severe rash/hives, skin peeling, or any reaction on the inside of your mouth or nose? Unknown Did you need to seek medical attention at a hospital or doctor's office? Yes When did it last happen?      Childhood allergy  If all above answers are "NO", may proceed with cephalosporin use.    Rosuvastatin Other (See Comments)  Myalgias     Immunization History  Administered Date(s) Administered   Fluad Quad(high Dose 65+) 06/20/2021, 05/11/2022    Past Medical History:  Diagnosis Date   Anginal pain (HCC)    Arthritis    CAD (coronary artery disease)    a. s/p MI with LAD and Diag stenting @ [name];  b. 06/2015 Cath: LAD 67m/d ISR, 100 RCA (ISR) w/ L->R collats, otw mod nonobs dzs-->Med Rx; c. 08/2019 Cath: LM nl, LAD 10p/m ISR, D1 20, D2 100, RI min irregs, LCX 52m/d, OM1 100, OM2 50, RCA 100p, 70d. RPDA fills via collats from LAD. EF 25-35%-->Med Rx; d. 10/2019 NSTEMI/Cath: LCX now 100 (2.75x15 Resolute Onyx DES), otw stable compared to 08/2019.   Chronic combined systolic and diastolic CHF (congestive heart failure) (HCC)     a. 06/2015 Echo: EF 20-25%, Gr3 DD; b. 10/2019 Echo: EF 25-30%, glob HK, sev inf/infapical HK. Mod dil LA.   Dyspnea    Essential hypertension    Headache 12/23/2021   Hyperlipidemia    Hypokalemia    a. 06/2015 in setting of diuresis.   Ischemic cardiomyopathy    a. 2011 EF 45% ([name]);  b. 06/2015 Echo: EF 20-25%; c. 04/2019 Echo: EF 40-45%; d. 08/2019 LV gram: EF 25-35%; e. 10/2019 Echo: EF 25-30%.   PVC's (premature ventricular contractions)    a. 09/2019 Zio (3 days): Avg hr 70, 4 runs NSVT, 5 runs SVT, rare PACs, frequent PVCs w/ 11.8% burden.   Sleep apnea    Stroke [facility])    Stroke/Right temporal lobe infarction [facility])    a. 10/2019 MRI brain: 1cm acute ischemic nonhemorrhagic R temporal lobe infarct. Age-related cerebral atrophy w/ moderate chronic small vessel ischemic dzs.   Type 2 diabetes mellitus with hyperglycemia (HCC) 10/08/2019    Tobacco History: Social History   Tobacco Use  Smoking Status Former   Current packs/day: 0.00   Types: Cigarettes   Quit date: 06/10/2011   Years since quitting: 11.7  Smokeless Tobacco Never   Counseling given: Not Answered   Outpatient Medications Prior to Visit  Medication Sig Dispense Refill   amiodarone (PACERONE) 200 MG tablet Take 1 tablet (200 mg total) by mouth daily. 90 tablet 1   aspirin EC 81 MG tablet Take 1 tablet (81 mg total) by mouth daily. Swallow whole. 90 tablet 3   carvedilol (COREG) 6.25 MG tablet Take 1 tablet (6.25 mg total) by mouth 2 (two) times daily with a meal. 60 tablet 3   ezetimibe (ZETIA) 10 MG tablet Take 1 tablet (10 mg total) by mouth daily. 90 tablet 3   furosemide (LASIX) 40 MG tablet Take 1 tablet (40 mg total) by mouth daily. 90 tablet 3   gabapentin (NEURONTIN) 100 MG capsule Take 200 mg by mouth at bedtime.     losartan (COZAAR) 50 MG tablet Take 1 tablet (50 mg total) by mouth daily. 90 tablet 3   nitroGLYCERIN (NITROSTAT) 0.4 MG SL tablet Place 1 tablet (0.4 mg total) under the tongue every 5  (five) minutes as needed for chest pain. 90 tablet 3   phenylephrine (NEO-SYNEPHRINE) 0.5 % nasal solution Place 1 drop into both nostrils at bedtime.     potassium chloride SA (KLOR-CON M) 20 MEQ tablet Take 1 tablet (20 mEq total) by mouth daily. 30 tablet 3   sodium chloride (OCEAN) 0.65 % SOLN nasal spray Place 1 spray into both nostrils as needed for congestion.     spironolactone (ALDACTONE) 25 MG tablet Take 0.5  tablets (12.5 mg total) by mouth daily. 45 tablet 3   Tiotropium Bromide-Olodaterol (STIOLTO RESPIMAT) 2.5-2.5 MCG/ACT AERS Inhale 2 puffs into the lungs daily. 4 g 11   gabapentin (NEURONTIN) 300 MG capsule Take 1 capsule (300 mg total) by mouth at bedtime. (Patient not taking: Reported on 12/07/2022) 90 capsule 1   methocarbamol (ROBAXIN) 500 MG tablet Take 1 tablet (500 mg total) by mouth 3 (three) times daily as needed. (Patient not taking: Reported on 12/07/2022) 90 tablet 0   sertraline (ZOLOFT) 50 MG tablet Take 1 tablet (50 mg total) by mouth daily. (Patient not taking: Reported on 12/07/2022) 90 tablet 0   Tiotropium Bromide-Olodaterol (STIOLTO RESPIMAT) 2.5-2.5 MCG/ACT AERS Inhale 2 puffs into the lungs daily. (Patient not taking: Reported on 02/25/2023) 8 g 0   No facility-administered medications prior to visit.     Review of Systems:   Constitutional: No night sweats, fevers, chills,  or lassitude. +weight gain, fatigue  HEENT: No headaches, difficulty swallowing, tooth/dental problems, or sore throat. No sneezing, itching, ear ache, nasal congestion, or post nasal drip CV:  +swelling in lower extremities, orthopnea. No chest pain, PND, anasarca, dizziness, palpitations, syncope Resp: +shortness of breath with exertion; congested cough. No excess mucus or change in color of mucus. No hemoptysis. No wheezing.  No chest wall deformity GI:  No heartburn, indigestion GU: No dysuria, change in color of urine, urgency or frequency.   Skin: No rash, lesions, ulcerations MSK:  No  joint pain or swelling. Neuro: No dizziness or lightheadedness.  Psych: No depression or anxiety. Mood stable.     Physical Exam:  BP (!) 160/98 (BP Location: Left Arm, Cuff Size: Large)   Pulse 66   Temp (!) 97.3 F (36.3 C)   Ht 5\' 6"  (1.676 m)   Wt 214 lb 3.2 oz (97.2 kg)   SpO2 94%   BMI 34.57 kg/m   GEN: Pleasant, interactive, well-kempt; obese; in no acute distress. HEENT:  Normocephalic and atraumatic. PERRLA. Sclera white. Nasal turbinates pink, moist and patent bilaterally. No rhinorrhea present. Oropharynx pink and moist, without exudate or edema. No lesions, ulcerations, or postnasal drip.  NECK:  Supple w/ fair ROM. No JVD present. Normal carotid impulses w/o bruits. Thyroid symmetrical with no goiter or nodules palpated. No lymphadenopathy.   CV: RRR, no m/r/g. +1 peripheral edema. Pulses intact, +2 bilaterally. No cyanosis, pallor or clubbing. PULMONARY:  Unlabored, regular breathing at rest. Increased work of breathing with exertion. Expiratory wheeze bilaterally A&P. No accessory muscle use.  GI: BS present and normoactive. Soft, non-tender to palpation. No organomegaly or masses detected.  MSK: No erythema, warmth or tenderness. Cap refil <2 sec all extrem. No deformities or joint swelling noted.  Neuro: A/Ox3. No focal deficits noted.   Skin: Warm, no lesions or rashe Psych: Normal affect and behavior. Judgement and thought content appropriate.     Lab Results:  CBC    Component Value Date/Time   WBC 10.3 05/21/2022 2206   RBC 5.31 05/21/2022 2206   HGB 15.7 05/21/2022 2206   HCT 43.4 05/21/2022 2206   PLT 162 05/21/2022 2206   MCV 81.7 05/21/2022 2206   MCH 29.6 05/21/2022 2206   MCHC 36.2 (H) 05/21/2022 2206   RDW 13.2 05/21/2022 2206   LYMPHSABS 1.5 05/21/2022 2206   MONOABS 1.0 05/21/2022 2206   EOSABS 0.1 05/21/2022 2206   BASOSABS 0.1 05/21/2022 2206    BMET    Component Value Date/Time   NA 139 12/07/2022 1114  K 3.6 12/07/2022 1114    CL 108 12/07/2022 1114   CO2 25 12/07/2022 1114   GLUCOSE 139 (H) 12/07/2022 1114   BUN 16 12/07/2022 1114   CREATININE 1.03 12/07/2022 1114   CALCIUM 8.7 (L) 12/07/2022 1114   GFRNONAA >60 12/07/2022 1114   GFRAA >60 10/17/2019 0943    BNP    Component Value Date/Time   BNP 604.0 (H) 12/07/2022 1114     Imaging:  ECHOCARDIOGRAM COMPLETE  Result Date: 02/03/2023    ECHOCARDIOGRAM REPORT   Patient Name:   Glen Slater. Date of Exam: 02/03/2023 Medical Rec #:  [id number]         Height:       66.0 in Accession #:    [id number]        Weight:       208.4 lb Date of Birth:  Jun 13, 1956          BSA:          2.035 m Patient Age:    67 years          BP:           129/80 mmHg Patient Gender: M                 HR:           62 bpm. Exam Location:  [facility] Procedure: 2D Echo, Cardiac Doppler, Color Doppler, Intracardiac Opacification            Agent, Strain Analysis and 3D Echo Indications:     CHF  History:         Patient has prior history of Echocardiogram examinations, most                  recent 12/23/2021. CHF and Cardiomyopathy, Angina, CAD and                  Previous Myocardial Infarction, Stroke and COPD,                  Arrythmias:Tachycardia, Signs/Symptoms:Chest Pain and Shortness                  of Breath; Risk Factors:Hypertension, Diabetes, Dyslipidemia                  and Sleep Apnea.  Sonographer:     Mikki Harbor Referring Phys:  [id number] Bevelyn Buckles BENSIMHON Diagnosing Phys: Julien Nordmann MD  Sonographer Comments: Global longitudinal strain was attempted. IMPRESSIONS  1. Left ventricular ejection fraction, by estimation, is 30 to 35%. Left ventricular ejection fraction by 2D MOD biplane is 31.4 %. The left ventricle has moderately decreased function. The left ventricle demonstrates moderate global hypokinesis with severe hypokinesis of the inferior, inferolateral and apical region. The left ventricular internal cavity size was moderately dilated. The average left ventricular global  longitudinal strain is -6.8 %.  2. Right ventricular systolic function is normal. The right ventricular size is normal. There is mildly elevated pulmonary artery systolic pressure. The estimated right ventricular systolic pressure is 40.3 mmHg.  3. Left atrial size was severely dilated.  4. The mitral valve is normal in structure. Moderate mitral valve regurgitation. No evidence of mitral stenosis.  5. The aortic valve is tricuspid. There is mild calcification of the aortic valve. Aortic valve regurgitation is not visualized. Aortic valve sclerosis is present, with no evidence of aortic valve stenosis.  6. There is borderline dilatation of the aortic root, measuring 39 mm.  7. The inferior vena cava is normal in size with greater than 50% respiratory variability, suggesting right atrial pressure of 3 mmHg. FINDINGS  Left Ventricle: Left ventricular ejection fraction, by estimation, is 30 to 35%. Left ventricular ejection fraction by 2D MOD biplane is 31.4 %. The left ventricle has moderately decreased function. The left ventricle demonstrates global hypokinesis. Definity contrast agent was given IV to delineate the left ventricular endocardial borders. The average left ventricular global longitudinal strain is -6.8 %. The left ventricular internal cavity size was moderately dilated. There is no left ventricular hypertrophy. Left ventricular diastolic parameters are indeterminate. Right Ventricle: The right ventricular size is normal. No increase in right ventricular wall thickness. Right ventricular systolic function is normal. There is mildly elevated pulmonary artery systolic pressure. The tricuspid regurgitant velocity is 2.84  m/s, and with an assumed right atrial pressure of 8 mmHg, the estimated right ventricular systolic pressure is 40.3 mmHg. Left Atrium: Left atrial size was severely dilated. Right Atrium: Right atrial size was normal in size. Pericardium: There is no evidence of pericardial effusion.  Mitral Valve: The mitral valve is normal in structure. There is mild calcification of the mitral valve leaflet(s). Moderate mitral valve regurgitation. No evidence of mitral valve stenosis. MV peak gradient, 4.5 mmHg. The mean mitral valve gradient is 2.0 mmHg. Tricuspid Valve: The tricuspid valve is normal in structure. Tricuspid valve regurgitation is mild . No evidence of tricuspid stenosis. Aortic Valve: The aortic valve is tricuspid. There is mild calcification of the aortic valve. Aortic valve regurgitation is not visualized. Aortic valve sclerosis is present, with no evidence of aortic valve stenosis. Aortic valve mean gradient measures 3.0 mmHg. Aortic valve peak gradient measures 4.9 mmHg. Aortic valve area, by VTI measures 3.21 cm. Pulmonic Valve: The pulmonic valve was normal in structure. Pulmonic valve regurgitation is not visualized. No evidence of pulmonic stenosis. Aorta: The aortic root is normal in size and structure. There is borderline dilatation of the aortic root, measuring 39 mm. Venous: The inferior vena cava is normal in size with greater than 50% respiratory variability, suggesting right atrial pressure of 3 mmHg. IAS/Shunts: No atrial level shunt detected by color flow Doppler. Additional Comments: A device lead is visualized.  LEFT VENTRICLE PLAX 2D                        Biplane EF (MOD) LVIDd:         6.50 cm         LV Biplane EF:   Left LVIDs:         5.60 cm                          ventricular LV PW:         0.70 cm                          ejection LV IVS:        1.10 cm                          fraction by LVOT diam:     2.10 cm                          2D MOD LV SV:         80  biplane is LV SV Index:   39                               31.4 %. LVOT Area:     3.46 cm                                 2D LV Volumes (MOD)               Longitudinal LV vol d, MOD    198.0 ml      Strain A2C:                           2D Strain GLS  -6.8 % LV vol d, MOD     192.0 ml      Avg: A4C: LV vol s, MOD    149.0 ml A2C: LV vol s, MOD    122.0 ml A4C: LV SV MOD A2C:   49.0 ml LV SV MOD A4C:   192.0 ml LV SV MOD BP:    61.9 ml RIGHT VENTRICLE RV Basal diam:  3.60 cm RV Mid diam:    3.20 cm RV S prime:     11.20 cm/s TAPSE (M-mode): 2.5 cm LEFT ATRIUM              Index        RIGHT ATRIUM           Index LA diam:        5.30 cm  2.60 cm/m   RA Area:     21.10 cm LA Vol (A2C):   145.0 ml 71.25 ml/m  RA Volume:   59.00 ml  28.99 ml/m LA Vol (A4C):   103.0 ml 50.61 ml/m LA Biplane Vol: 123.0 ml 60.44 ml/m  AORTIC VALVE                    PULMONIC VALVE AV Area (Vmax):    3.18 cm     PV Vmax:       0.90 m/s AV Area (Vmean):   3.11 cm     PV Peak grad:  3.2 mmHg AV Area (VTI):     3.21 cm AV Vmax:           111.00 cm/s AV Vmean:          79.400 cm/s AV VTI:            0.248 m AV Peak Grad:      4.9 mmHg AV Mean Grad:      3.0 mmHg LVOT Vmax:         102.00 cm/s LVOT Vmean:        71.200 cm/s LVOT VTI:          0.230 m LVOT/AV VTI ratio: 0.93  AORTA Ao Root diam: 3.90 cm MITRAL VALVE                  TRICUSPID VALVE MV Area (PHT): 4.83 cm       TR Peak grad:   32.3 mmHg MV Area VTI:   2.34 cm       TR Vmax:        284.00 cm/s MV Peak grad:  4.5 mmHg MV Mean grad:  2.0 mmHg       SHUNTS MV Vmax:  1.06 m/s       Systemic VTI:  0.23 m MV Vmean:      59.5 cm/s      Systemic Diam: 2.10 cm MV Decel Time: 157 msec MR Peak grad:    89.1 mmHg MR Mean grad:    66.0 mmHg MR Vmax:         472.00 cm/s MR Vmean:        382.0 cm/s MR PISA:         2.26 cm MR PISA Eff ROA: 44 mm MR PISA Radius:  0.60 cm MV E velocity: 96.90 cm/s MV A velocity: 49.30 cm/s MV E/A ratio:  1.97 Julien Nordmann MD Electronically signed by Julien Nordmann MD Signature Date/Time: 02/03/2023/1:23:33 PM    Final     Administration History     None          Latest Ref Rng & Units 04/15/2022    1:46 PM  PFT Results  FVC-Pre L 2.89   FVC-Predicted Pre % 73   FVC-Post L 2.89   FVC-Predicted Post % 73    Pre FEV1/FVC % % 74   Post FEV1/FCV % % 78   FEV1-Pre L 2.15   FEV1-Predicted Pre % 72   FEV1-Post L 2.26   DLCO uncorrected ml/min/mmHg 10.63   DLCO UNC% % 45   DLVA Predicted % 58   TLC L 4.31   TLC % Predicted % 69   RV % Predicted % 75     No results found for: "NITRICOXIDE"      Assessment & Plan:   Acute on chronic systolic CHF (congestive heart failure) (HCC) Concern for acute CHF exacerbation. Will check BNP and BMET today. Increase lasix for 5 days. Advised to monitor weights at home. Follow up with cardiology. Strict ED precautions. Close follow up.  Patient Instructions  Albuterol inhaler 2 puffs every 6 hours as needed for shortness of breath or wheezing. This is your new rescue inhaler to use if you have trouble with your breathing Continue Stiolto 2 puffs daily. Only use 2 puffs daily of this   Increase lasix to 80 mg daily for the next 5 days. Take in AM Prednisone 40 mg daily for 5 days. Take in AM with food   Chest x ray today  Labs today  Follow up in 3-4 weeks with Dr. Jayme Cloud or Katie Ewelina Naves,NP. If symptoms do not improve or worsen, please contact office for sooner follow up or seek emergency care.    COPD with acute exacerbation (HCC) Possible AECOPD related to dust exposure; although symptoms are more consistent with CHF exacerbation. We will treat him with prednisone burst. Educated on the difference between maintenance and rescue therapies. Provided with PRN SABA. Action plan in place. CXR today to rule out superimposed infection.    I spent 42 minutes of dedicated to the care of this patient on the date of this encounter to include pre-visit review of records, face-to-face time with the patient discussing conditions above, post visit ordering of testing, clinical documentation with the electronic health record, making appropriate referrals as documented, and communicating necessary findings to members of the patients care team.  Noemi Chapel,  NP 02/25/2023  Pt aware and understands NP's role.

## 2023-02-28 NOTE — Progress Notes (Signed)
Agree with the details of the visit as noted by Katherine Cobb, NP. ° °C. Laura Gonzalez, MD °Advanced Bronchoscopy °P[facility] ° °

## 2023-03-01 ENCOUNTER — Other Ambulatory Visit
Admission: RE | Admit: 2023-03-01 | Discharge: 2023-03-01 | Disposition: A | Discharge status: Home / Self Care | Payer: Medicaid Other | Attending: Internal Medicine | Admitting: Internal Medicine

## 2023-03-01 ENCOUNTER — Other Ambulatory Visit: Payer: Self-pay

## 2023-03-01 ENCOUNTER — Ambulatory Visit (HOSPITAL_BASED_OUTPATIENT_CLINIC_OR_DEPARTMENT_OTHER): Payer: Medicaid Other | Admitting: Internal Medicine

## 2023-03-01 VITALS — BP 142/80 | HR 73 | Ht 66.0 in | Wt 211.0 lb

## 2023-03-01 DIAGNOSIS — I252 Old myocardial infarction: Secondary | ICD-10-CM | POA: Insufficient documentation

## 2023-03-01 DIAGNOSIS — F419 Anxiety disorder, unspecified: Secondary | ICD-10-CM | POA: Insufficient documentation

## 2023-03-01 DIAGNOSIS — G4733 Obstructive sleep apnea (adult) (pediatric): Secondary | ICD-10-CM | POA: Diagnosis not present

## 2023-03-01 DIAGNOSIS — I2510 Atherosclerotic heart disease of native coronary artery without angina pectoris: Secondary | ICD-10-CM | POA: Insufficient documentation

## 2023-03-01 DIAGNOSIS — Z87891 Personal history of nicotine dependence: Secondary | ICD-10-CM | POA: Insufficient documentation

## 2023-03-01 DIAGNOSIS — I5022 Chronic systolic (congestive) heart failure: Secondary | ICD-10-CM | POA: Diagnosis not present

## 2023-03-01 DIAGNOSIS — Z91148 Patient's other noncompliance with medication regimen for other reason: Secondary | ICD-10-CM | POA: Insufficient documentation

## 2023-03-01 DIAGNOSIS — I255 Ischemic cardiomyopathy: Secondary | ICD-10-CM | POA: Diagnosis not present

## 2023-03-01 DIAGNOSIS — I110 Hypertensive heart disease with heart failure: Secondary | ICD-10-CM | POA: Insufficient documentation

## 2023-03-01 DIAGNOSIS — Z95810 Presence of automatic (implantable) cardiac defibrillator: Secondary | ICD-10-CM | POA: Insufficient documentation

## 2023-03-01 DIAGNOSIS — I10 Essential (primary) hypertension: Secondary | ICD-10-CM

## 2023-03-01 DIAGNOSIS — Z955 Presence of coronary angioplasty implant and graft: Secondary | ICD-10-CM | POA: Diagnosis not present

## 2023-03-01 DIAGNOSIS — I5023 Acute on chronic systolic (congestive) heart failure: Secondary | ICD-10-CM | POA: Diagnosis not present

## 2023-03-01 DIAGNOSIS — Z79899 Other long term (current) drug therapy: Secondary | ICD-10-CM | POA: Diagnosis not present

## 2023-03-01 DIAGNOSIS — Z8673 Personal history of transient ischemic attack (TIA), and cerebral infarction without residual deficits: Secondary | ICD-10-CM | POA: Diagnosis not present

## 2023-03-01 DIAGNOSIS — I493 Ventricular premature depolarization: Secondary | ICD-10-CM | POA: Diagnosis not present

## 2023-03-01 DIAGNOSIS — E119 Type 2 diabetes mellitus without complications: Secondary | ICD-10-CM | POA: Insufficient documentation

## 2023-03-01 DIAGNOSIS — I472 Ventricular tachycardia, unspecified: Secondary | ICD-10-CM

## 2023-03-01 LAB — BASIC METABOLIC PANEL
Anion gap: 10 (ref 5–15)
BUN: 38 mg/dL — ABNORMAL HIGH (ref 8–23)
CO2: 21 mmol/L — ABNORMAL LOW (ref 22–32)
Calcium: 9 mg/dL (ref 8.9–10.3)
Chloride: 104 mmol/L (ref 98–111)
Creatinine, Ser: 1.13 mg/dL (ref 0.61–1.24)
GFR, Estimated: >60 mL/min (ref >60)
Glucose, Bld: 281 mg/dL — ABNORMAL HIGH (ref 70–99)
Potassium: 3.8 mmol/L (ref 3.5–5.1)
Sodium: 135 mmol/L (ref 135–145)

## 2023-03-01 LAB — BRAIN NATRIURETIC PEPTIDE: B Natriuretic Peptide: 537.2 pg/mL — ABNORMAL HIGH (ref 0.0–100.0)

## 2023-03-01 MED ORDER — SPIRONOLACTONE 25 MG PO TABS
25.0000 mg | ORAL_TABLET | Freq: Every day | ORAL | 3 refills | Status: DC
  Filled 2023-03-01: qty 90, 90d supply, fill #0

## 2023-03-01 NOTE — Patient Instructions (Addendum)
Medication Changes:  Take lasix 80 mg (2 tablets)  daily for 3 more days. THEN switch back to 40 mg (1 tablet) daily on FRIDAY.   Increase Spironolactone to 25 mg (1 tablet) daily.    Lab Work:  Labs done today, your results will be available in MyChart, we will contact you for abnormal readings.   Special Instructions // Education:  Do the following things EVERYDAY: Weigh yourself in the morning before breakfast. Write it down and keep it in a log. Take your medicines as prescribed Eat low salt foods--Limit salt (sodium) to 2000 mg per day.  Stay as active as you can everyday Limit all fluids for the day to less than 2 liters   PLEASE drink Less Lemonade.   Follow-Up in: You will follow up with our pharmacist Enos Fling on 9/10.  And with Dr. Gala Romney in October.    If you have any questions or concerns before your next appointment please send [place] a message through [place] or call our office at [phone] Monday-Friday 8 am-5 pm.   If you have an urgent need after hours on the weekend please call your Primary Cardiologist or the [facility] in [place] at [phone].

## 2023-03-01 NOTE — Progress Notes (Signed)
ADVANCED HF CLINIC NOTE  Referring Physician: Lorine Bears, MD Primary Care: Patient, No Pcp Per Primary Cardiologist: Lorine Bears, MD   HPI:  [name]. is a 67 y.o. male with CAD, DM2, PVCs, former smoker, previous CVA, HTN, OSA and chronic systolic HF referred by Dr. Kirke Corin for further evaluation of his HF.  He has known history of CAD s/p  remote MI with previous LAD,diagonal and RCA stenting at [facility]. Cath 2016 showed patent LAD and diagonal stents and chronically occluded RCA and left to right collaterals.  He also has chronic systolic heart failure due to ischemic cardiomyopathy EF 25-30%  Cath 2/21 with 3v CAD with patent stents in the LAD and diagonal without significant restenosis, chronically occluded RCA stents with right to right and left-to-right collaterals and significant stenosis in the distal left circumflex supplying a relatively small OM 3 distribution.  Ejection fraction was 25 to 30%.  Right heart catheterization showed normal filling pressures, mild pulmonary hypertension and normal cardiac output.  Medical therapy was recommended.  Had NSTEMI 4/21 Cath showed occluded distal left circumflex which was treated successfully with PCI and drug-eluting stent placement.     He was admitted 6/23 with chest pain and heart failure.  Echo EF 30-35%.  Cath showed patent LAD and LCx stents with chronically occluded RCA with L-to-R collaterals.  Medical therapy recommended. He did not fill his prescriptions.  He returned to the ED after he was stung by multiple yellow jackets and was found to be in VT at 175 bpm.  He was cardioverted with 120 J.  He went back into VT and was treated successfully with IV amiodarone.     Admitted 7/23 with VT in the setting of not taking amiodarone as prescribed.  He ultimately underwent ICD placement by Dr. Lalla Brothers in October.  I saw him for the first time in 12/23. He was not taking Entresto regularly because it dragged him down. Stopped  entresto and added eplerenone. He stopped eplerenone because it made him feel anxious and he couldn't sleep. I started losartan as well as sertraline for anxiety  Sleep study 1/24 with mild OSA (AHI 9)   Echo 02/03/23 EF 30-35% mod MR RV ok. Personally reviewed  Had cut back on lasix and was not taking regularly. Seen on 02/25/23 in Pulmonary office with 6-7 pound weight gain and increased SOB. CXR showed pulmonary edema. BNP elevated, Lasix restarted at 80 daily (was supposed to be on 40 daily but only taking a couple times/week). Also treated with prednisone and inhalers for possible COPD flare  Here for f/u. Breathing better. Drinking a lot of fluids. No CP. Weight down 3 pounds but still not back to baseline. No CP. Leg edema improved. Still SOB with mild exertion. + orthopnea  ReDS 44%   Past Medical History:  Diagnosis Date   Anginal pain (HCC)    Arthritis    CAD (coronary artery disease)    a. s/p MI with LAD and Diag stenting @ [name];  b. 06/2015 Cath: LAD 79m/d ISR, 100 RCA (ISR) w/ L->R collats, otw mod nonobs dzs-->Med Rx; c. 08/2019 Cath: LM nl, LAD 10p/m ISR, D1 20, D2 100, RI min irregs, LCX 88m/d, OM1 100, OM2 50, RCA 100p, 70d. RPDA fills via collats from LAD. EF 25-35%-->Med Rx; d. 10/2019 NSTEMI/Cath: LCX now 100 (2.75x15 Resolute Onyx DES), otw stable compared to 08/2019.   Chronic combined systolic and diastolic CHF (congestive heart failure) (HCC)    a.  06/2015 Echo: EF 20-25%, Gr3 DD; b. 10/2019 Echo: EF 25-30%, glob HK, sev inf/infapical HK. Mod dil LA.   Dyspnea    Essential hypertension    Headache 12/23/2021   Hyperlipidemia    Hypokalemia    a. 06/2015 in setting of diuresis.   Ischemic cardiomyopathy    a. 2011 EF 45% ([name]);  b. 06/2015 Echo: EF 20-25%; c. 04/2019 Echo: EF 40-45%; d. 08/2019 LV gram: EF 25-35%; e. 10/2019 Echo: EF 25-30%.   PVC's (premature ventricular contractions)    a. 09/2019 Zio (3 days): Avg hr 70, 4 runs NSVT, 5 runs SVT, rare PACs, frequent  PVCs w/ 11.8% burden.   Sleep apnea    Stroke [facility])    Stroke/Right temporal lobe infarction [facility])    a. 10/2019 MRI brain: 1cm acute ischemic nonhemorrhagic R temporal lobe infarct. Age-related cerebral atrophy w/ moderate chronic small vessel ischemic dzs.   Type 2 diabetes mellitus with hyperglycemia (HCC) 10/08/2019    Current Outpatient Medications  Medication Sig Dispense Refill   albuterol (VENTOLIN HFA) 108 (90 Base) MCG/ACT inhaler Inhale 2 puffs into the lungs every 6 (six) hours as needed for wheezing or shortness of breath. 18 g 2   amiodarone (PACERONE) 200 MG tablet Take 1 tablet (200 mg total) by mouth daily. 90 tablet 1   aspirin EC 81 MG tablet Take 1 tablet (81 mg total) by mouth daily. Swallow whole. 90 tablet 3   carvedilol (COREG) 6.25 MG tablet Take 1 tablet (6.25 mg total) by mouth 2 (two) times daily with a meal. 60 tablet 3   ezetimibe (ZETIA) 10 MG tablet Take 1 tablet (10 mg total) by mouth daily. 90 tablet 3   furosemide (LASIX) 40 MG tablet Take 1 tablet (40 mg total) by mouth daily. 90 tablet 3   gabapentin (NEURONTIN) 300 MG capsule Take 1 capsule (300 mg total) by mouth at bedtime. 90 capsule 1   losartan (COZAAR) 50 MG tablet Take 1 tablet (50 mg total) by mouth daily. 90 tablet 3   potassium chloride SA (KLOR-CON M) 20 MEQ tablet Take 1 tablet (20 mEq total) by mouth daily. 30 tablet 3   predniSONE (DELTASONE) 20 MG tablet Take 2 tablets (40 mg total) by mouth daily with breakfast for 5 days. 10 tablet 0   spironolactone (ALDACTONE) 25 MG tablet Take 0.5 tablets (12.5 mg total) by mouth daily. 45 tablet 3   Tiotropium Bromide-Olodaterol (STIOLTO RESPIMAT) 2.5-2.5 MCG/ACT AERS Inhale 2 puffs into the lungs daily. 8 g 0   Tiotropium Bromide-Olodaterol (STIOLTO RESPIMAT) 2.5-2.5 MCG/ACT AERS Inhale 2 puffs into the lungs daily. 4 g 11   gabapentin (NEURONTIN) 100 MG capsule Take 200 mg by mouth at bedtime. (Patient not taking: Reported on 03/01/2023)      methocarbamol (ROBAXIN) 500 MG tablet Take 1 tablet (500 mg total) by mouth 3 (three) times daily as needed. (Patient not taking: Reported on 12/07/2022) 90 tablet 0   nitroGLYCERIN (NITROSTAT) 0.4 MG SL tablet Place 1 tablet (0.4 mg total) under the tongue every 5 (five) minutes as needed for chest pain. (Patient not taking: Reported on 03/01/2023) 90 tablet 3   phenylephrine (NEO-SYNEPHRINE) 0.5 % nasal solution Place 1 drop into both nostrils at bedtime. (Patient not taking: Reported on 03/01/2023)     sertraline (ZOLOFT) 50 MG tablet Take 1 tablet (50 mg total) by mouth daily. (Patient not taking: Reported on 12/07/2022) 90 tablet 0   sodium chloride (OCEAN) 0.65 % SOLN nasal spray Place  1 spray into both nostrils as needed for congestion. (Patient not taking: Reported on 03/01/2023)     No current facility-administered medications for this visit.    Allergies  Allergen Reactions   Contrast Media [Iodinated Contrast Media] Shortness Of Breath   Iohexol Shortness Of Breath     Onset Date: [id number]    Jardiance [Empagliflozin] Other (See Comments)    Fatigue/weakness   Atorvastatin Other (See Comments)    Myalgias    Hydrocodone Itching   Morphine And Codeine Other (See Comments)    Lost control    Penicillins Other (See Comments)    Unknown reaction Did it involve swelling of the face/tongue/throat, SOB, or low BP? Unknown Did it involve sudden or severe rash/hives, skin peeling, or any reaction on the inside of your mouth or nose? Unknown Did you need to seek medical attention at a hospital or doctor's office? Yes When did it last happen?      Childhood allergy  If all above answers are "NO", may proceed with cephalosporin use.    Rosuvastatin Other (See Comments)    Myalgias       Social History   Socioeconomic History   Marital status: Widowed    Spouse name: Not on file   Number of children: 2   Years of education: Not on file   Highest education level: GED or equivalent   Occupational History   Occupation: Statistician   Tobacco Use   Smoking status: Former    Current packs/day: 0.00    Types: Cigarettes    Quit date: 06/10/2011    Years since quitting: 11.7   Smokeless tobacco: Never  Substance and Sexual Activity   Alcohol use: No    Alcohol/week: 0.0 standard drinks of alcohol   Drug use: No   Sexual activity: Not Currently  Other Topics Concern   Not on file  Social History Narrative   Lives alone.   Social Determinants of Health   Financial Resource Strain: Low Risk  (05/11/2022)   Received from [facility], [facility]   Overall Financial Resource Strain (CARDIA)    Difficulty of Paying Living Expenses: Not very hard  Food Insecurity: No Food Insecurity (05/11/2022)   Received from [facility], [facility]   Hunger Vital Sign    Worried About Running Out of Food in the Last Year: Never true    Ran Out of Food in the Last Year: Never true  Transportation Needs: No Transportation Needs (05/11/2022)   Received from [facility], [facility]   [facility] - Transportation    Lack of Transportation (Medical): No    Lack of Transportation (Non-Medical): No  Physical Activity: Inactive (04/30/2019)   Exercise Vital Sign    Days of Exercise per Week: 0 days    Minutes of Exercise per Session: 0 min  Stress: No Stress Concern Present (04/30/2019)   [employer] of Occupational Health - Occupational Stress Questionnaire    Feeling of Stress : Not at all  Social Connections: Moderately Isolated (04/30/2019)   Social Connection and Isolation Panel [NHANES]    Frequency of Communication with Friends and Family: More than three times a week    Frequency of Social Gatherings with Friends and Family: More than three times a week    Attends Religious Services: Never    Database administrator or Organizations: Yes    Attends Banker Meetings: Never    Marital Status: Divorced  Intimate Partner Violence: Not At  Risk (04/30/2019)   Humiliation, Afraid, Rape, and Kick questionnaire    Fear of Current or Ex-Partner: No    Emotionally Abused: No    Physically Abused: No    Sexually Abused: No      Family History  Problem Relation Age of Onset   Coronary artery disease Mother    Diabetes Mother    Coronary artery disease Father     Vitals:   03/01/23 1028  BP: (!) 142/80  Pulse: 73  SpO2: 95%  Weight: 211 lb (95.7 kg)  Height: 5\' 6"  (1.676 m)   Wt Readings from Last 3 Encounters:  03/01/23 211 lb (95.7 kg)  02/25/23 214 lb 3.2 oz (97.2 kg)  12/07/22 208 lb 6.4 oz (94.5 kg)    PHYSICAL EXAM: General:  Sitting in chiar No resp difficulty HEENT: normal Neck: supple JVP 8 Carotids 2+ bilat; no bruits. No lymphadenopathy or thryomegaly appreciated. Cor: PMI nondisplaced. Regular rate & rhythm. No rubs, gallops or murmurs. Lungs: clear but decreased Abdomen: soft, nontender, nondistended. No hepatosplenomegaly. No bruits or masses. Good bowel sounds. Extremities: no cyanosis, clubbing, rash, edema Neuro: alert & orientedx3, cranial nerves grossly intact. moves all 4 extremities w/o difficulty. Affect pleasant  ICD interrogation:No VT/AF . Optivol was up but now back to baseline. Activity level ~3  hr/day Personally reviewed   ASSESSMENT & PLAN:  1.  Acute onc hronic systolic heart failure due to iCM - Echo 6/23 EF 30-35%.   - Echo 02/03/23 EF 30-35% mod MR RV ok. Personally reviewed - s/p MDT ICD - Worsening NYHA III-IIIb in setting of volume overload due to non-compliance with lasix - Optivol back down but ReDs 44% - Will continue lasix 80 daily for 3 more days. Then switch back to 40 daily. Refer to PharmD in 2 weeks to reassess fluid and adjust lasix - Continue carvedilol to 6.25 bid - Continue losartan 50 bid - Increase spiro to 25 daily  - Unable to tolerate eplerenone due to anxiety. Will hold for now - Unable to tolerate Entresto due to fatigue - He did not tolerate  Jardiance in the past due to fatigue and weakness.  He does not want to try another SGLT2 inhibitor. - Check labs   2. CAD  - s/p remote MI. S/p PCI LAD, LCx and RCA - Cath 6/23: patent LAD and left circumflex stents with chronically occluded right coronary artery with left-to-right collaterals.  Medical therapy was recommended. - No s/s angina  - Continue DAPT. He doesn't tolerate statin - Follows with Dr. Kirke Corin   3  Essential hypertension:  - Blood pressure up. Increasing spiro  4. H/o ventricular tachycardia: - Status post ICD placement.   - continue amio  - Device interrogated. No VT  5. H/o PVCs - Zio 3/21: Avg hr 70, 4 runs NSVT, 5 runs SVT, rare PACs, frequent PVCs w/ 11.8% burden. - consider repeat once fluid improved  6. Mild OSA - Sleep study 1/24 with mild OSA (AHI 9)   7. Anxiety - Symptoms improved with  Zoloft 50 but he stopped it - Follows with PCP  Total time spent 45 minutes. Over half that time spent discussing above.   Arvilla Meres, MD  10:46 AM

## 2023-03-01 NOTE — Progress Notes (Signed)
   03/01/23 1115  ReDS Vest / Clip  Station Marker C  Ruler Value 35  ReDS Value Range (!) > 40  ReDS Actual Value 44

## 2023-03-08 ENCOUNTER — Ambulatory Visit (INDEPENDENT_AMBULATORY_CARE_PROVIDER_SITE_OTHER): Payer: Medicaid Other

## 2023-03-08 DIAGNOSIS — I255 Ischemic cardiomyopathy: Secondary | ICD-10-CM | POA: Diagnosis not present

## 2023-03-08 DIAGNOSIS — I5022 Chronic systolic (congestive) heart failure: Secondary | ICD-10-CM

## 2023-03-09 LAB — CUP PACEART REMOTE DEVICE CHECK
Battery Remaining Longevity: 108 mo
Battery Voltage: 3.02 V
Brady Statistic AP VP Percent: 0.12 %
Brady Statistic AP VS Percent: 53.36 %
Brady Statistic AS VP Percent: 0.01 %
Brady Statistic AS VS Percent: 46.51 %
Brady Statistic RA Percent Paced: 53.19 %
Brady Statistic RV Percent Paced: 0.13 %
Date Time Interrogation Session: 20240903222504
HighPow Impedance: 89 Ohm
Implantable Lead Connection Status: 753985
Implantable Lead Connection Status: 753985
Implantable Lead Implant Date: 20231004
Implantable Lead Implant Date: 20231004
Implantable Lead Location: 753859
Implantable Lead Location: 753860
Implantable Lead Model: 5076
Implantable Pulse Generator Implant Date: 20231004
Lead Channel Impedance Value: 380 Ohm
Lead Channel Impedance Value: 494 Ohm
Lead Channel Impedance Value: 589 Ohm
Lead Channel Pacing Threshold Amplitude: 0.625 V
Lead Channel Pacing Threshold Amplitude: 0.875 V
Lead Channel Pacing Threshold Pulse Width: 0.4 ms
Lead Channel Pacing Threshold Pulse Width: 0.4 ms
Lead Channel Sensing Intrinsic Amplitude: 27.25 mV
Lead Channel Sensing Intrinsic Amplitude: 27.25 mV
Lead Channel Sensing Intrinsic Amplitude: 3 mV
Lead Channel Sensing Intrinsic Amplitude: 3 mV
Lead Channel Setting Pacing Amplitude: 1.5 V
Lead Channel Setting Pacing Amplitude: 1.5 V
Lead Channel Setting Pacing Pulse Width: 0.4 ms
Lead Channel Setting Sensing Sensitivity: 0.3 mV
Zone Setting Status: 755011

## 2023-03-10 NOTE — Progress Notes (Unsigned)
Advanced Heart Failure Clinic Note  PCP: Patient, No Pcp Per PCP-Cardiologist: Lorine Bears, MD HF-Cardiologist: Arvilla Meres, MD  HPI:  [name]. is a 67 y.o. male with CAD, DM2, PVCs, former smoker, previous CVA, HTN, OSA and chronic systolic HF referred to AHF clinic by Dr. Kirke Corin for further evaluation of his HF.   He has known history of CAD s/p remote MI with previous LAD,diagonal and RCA stenting at [facility]. Cath 2016 showed patent LAD and diagonal stents and chronically occluded RCA and left to right collaterals.  He also has chronic systolic heart failure due to ischemic cardiomyopathy EF 25-30%   Cath 08/2019 with 3v CAD with patent stents in the LAD and diagonal without significant restenosis, chronically occluded RCA stents with right to right and left-to-right collaterals and significant stenosis in the distal left circumflex supplying a relatively small OM 3 distribution.  Ejection fraction was 25 to 30%.  Right heart catheterization showed normal filling pressures, mild pulmonary hypertension and normal cardiac output.  Medical therapy was recommended.   Had NSTEMI 10/2019 Cath showed occluded distal left circumflex which was treated successfully with PCI and drug-eluting stent placement.     He was admitted 12/2021 with chest pain and heart failure.  Echo EF 30-35%.  Cath showed patent LAD and LCx stents with chronically occluded RCA with L-to-R collaterals.  Medical therapy recommended. He did not fill his prescriptions.  He returned to the ED after he was stung by multiple yellow jackets and was found to be in VT at 175 bpm.  He was cardioverted with 120 J.  He went back into VT and was treated successfully with IV amiodarone.     Admitted 01/2022 with VT in the setting of not taking amiodarone as prescribed.  He ultimately underwent ICD placement by Dr. Lalla Brothers in October.   First seen by AHF clinic in 06/2022. He was not taking Entresto regularly because it "dragged  him down". Stopped entresto and added eplerenone. He stopped eplerenone because it made him feel anxious and he couldn't sleep. Started on losartan as well as sertraline for anxiety.   Sleep study 07/2022 with mild OSA (AHI 9)    Echo 02/03/23 EF 30-35% mod MR RV ok.   Had cut back on lasix and was not taking regularly. Seen on 02/25/23 in Pulmonary office with 6-7 pound weight gain and increased SOB. CXR showed pulmonary edema. BNP elevated, Lasix restarted at 80 daily (was supposed to be on 40 daily but only taking a couple times/week). Also treated with prednisone and inhalers for possible COPD flare.  Last seen by AHF clinic on 03/01/23.Remained SOB with mild exertion. + orthopnea, ReDS 44%. Given furosemide 80 daily for 3 days, then to 40 daily. Spironolactone increased to 25 mg daily at that time.  Today [name]. returns to Heart Failure Clinic for pharmacist medication titration. Reports overall feeling Unwell. Reports feeling fatigued with no energy every day, occassional dizziness, lightheadedness, SOB with moderate exertion, orthopnea, and PND. Reports one episode of palpitations this past weekend lasting ~30 seconds. Occasional orthostasis. Reports mild improvement in symptoms since stopping all medications. Denies LEE and chest pain.  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 Is active throughout the day. Does not weigh at home. Takes no diuretic currently. Appetite is good. Does follow a low sodium diet. Reports being thirsty frequently. REDS today is 30%.  Current HF Medications: Furosemide 40 mg daily Carvedilol 6.25 mg BID Losartan 50 mg daily Spironolactone 25 mg daily *Patient is not taking any medications currently*  Has the patient been experiencing any side effects to the medications prescribed? Yes. Reports intolerable weakness and fatigue that he associates with medication. Reports  fatigue worsened and nausea started after increasing spironolactone last visit. Given these side effects, [name] stopped taking all medications about ~1 week ago. Reports he has tolerated carvedilol well in the past and tolerated spironolactone before the dose increase. In addition to other HF medications, he had stopped taking amiodarone as well.  Does the patient have any problems obtaining medications due to transportation or finances? No .Copays are $4 for medications.  Understanding of regimen: Poor Understanding of indications: Poor Potential of adherence: Poor. Reported stopping all medications ~1 week ago. [name] is not willing to restart all HF medications today, but is willing to retrial in a tiered approach with frequent uptitration. Will add back two HF medications this week, then again in 2 weeks. Instructed the patient to start non-heart failure medications as previously prescribed by his other providers. Patient understands to avoid NSAIDs. Patient understands to avoid decongestants.  Pertinent Lab Values: Creatinine, Ser  Date Value Ref Range Status  03/01/2023 1.13 0.61 - 1.24 mg/dL Final   BUN  Date Value Ref Range Status  03/01/2023 38 (H) 8 - 23 mg/dL Final   Potassium  Date Value Ref Range Status  03/01/2023 3.8 3.5 - 5.1 mmol/L Final   Sodium  Date Value Ref Range Status  03/01/2023 135 135 - 145 mmol/L Final   B Natriuretic Peptide  Date Value Ref Range Status  03/01/2023 537.2 (H) 0.0 - 100.0 pg/mL Final    Comment:    Performed at [facility], [address] [place], [place] [address]   Magnesium  Date Value Ref Range Status  03/03/2022 2.3 1.7 - 2.4 mg/dL Final    Comment:    Performed at [facility], [address] [place], [place] [address]   TSH  Date Value Ref Range Status  06/14/2022 1.014 0.350 - 4.500 uIU/mL Final    Comment:    Performed by a 3rd Generation assay with a functional sensitivity of <=0.01  uIU/mL. Performed at [facility], [address], [place], [place] [address]     Vital Signs: There were no vitals filed for this visit.  Assessment/Plan: 1.  Acute onc hronic systolic heart failure due to iCM - Echo 12/2021 EF 30-35%.   - Echo 02/03/23 EF 30-35% mod MR RV ok.  - s/p MDT ICD - Persistent NYHA III-IIIb in setting of non-compliance with all medications. - ReDs improved to 30% today. Weight down 5 lbs from last visit. Appears euvolemic on exam without orthostasis or symptoms of severe hypovolemia.  - Given low-normal ReDs today and noncompliance with furosemide this past week, will transition furosemide to 40 mg as needed for weight gain of 3 lbs/24 hours or 5 lbs/week. Extensive education provided on how to use furosemide and to start daily weights. Patient agrees to daily weights. - Restart carvedilol 6.25 bid. The patient is comfortable with this medication and is willing to restart it first. Patient will reach out if symptoms of fatigue or SOB occur with restarting. - Will consider restarting losartan at next visit or retrialing Entresto. [name] is willing to try Entresto again. - Restart spironolactone at 12.5 daily since he reports tolerating this well in the  past. Was historically unable to tolerate eplerenone due to anxiety. - He did not tolerate Jardiance in the past due to fatigue and weakness. Is willing to try Farxiga in the future. Also with the compelling indication of T2DM. - Check BMP and BNP today. Will correlate BNP to ReDs and BNP at last visit. May make adjustments pending labs.    2. CAD  - s/p remote MI. S/p PCI LAD, LCx and RCA - Cath 12/2021: patent LAD and left circumflex stents with chronically occluded right coronary artery with left-to-right collaterals.  Medical therapy was recommended. - No s/s angina  - Instructed to continue antiplatelet and antilipid therapy. He didn't tolerate statin.  - Follows with Dr. Kirke Corin    3  Essential  hypertension:  - Blood pressure up. Restarting some home medications based on patient preference. Will add ARB or ARNI next visit.   4. H/o ventricular tachycardia: - Status post ICD placement.   - Counseled on the importance of continuing amiodarone unless otherwise instructed by a physician given history of VT. He confirmed understanding of the importance.    5. H/o PVCs - Zio 09/2019: Avg hr 70, 4 runs NSVT, 5 runs SVT, rare PACs, frequent PVCs w/ 11.8% burden. - MD to consider repeat. -Amiodarone as above.   6. Mild OSA - Sleep study 07/2022 with mild OSA (AHI 9)    7. Anxiety - Symptoms improved with  Zoloft 50 but he stopped it - Follows with PCP   Follow up: Pharmacy in 2 weeks, AHF MD on 04/15/23.  Thank you for involving pharmacy in this patient's care.  Enos Fling, PharmD, BCPS Phone - [phone] Clinical Pharmacist 03/15/2023 1:03 PM

## 2023-03-11 ENCOUNTER — Other Ambulatory Visit (HOSPITAL_COMMUNITY): Payer: Self-pay

## 2023-03-15 ENCOUNTER — Ambulatory Visit: Payer: Medicaid Other | Admitting: Pharmacist

## 2023-03-15 ENCOUNTER — Other Ambulatory Visit
Admission: RE | Admit: 2023-03-15 | Discharge: 2023-03-15 | Disposition: A | Discharge status: Home / Self Care | Payer: Medicaid Other | Source: Ambulatory Visit | Attending: Cardiology | Admitting: Cardiology

## 2023-03-15 ENCOUNTER — Other Ambulatory Visit: Payer: Self-pay

## 2023-03-15 VITALS — BP 136/82 | HR 81 | Wt 206.6 lb

## 2023-03-15 DIAGNOSIS — E119 Type 2 diabetes mellitus without complications: Secondary | ICD-10-CM | POA: Diagnosis not present

## 2023-03-15 DIAGNOSIS — Z8673 Personal history of transient ischemic attack (TIA), and cerebral infarction without residual deficits: Secondary | ICD-10-CM | POA: Insufficient documentation

## 2023-03-15 DIAGNOSIS — F419 Anxiety disorder, unspecified: Secondary | ICD-10-CM | POA: Diagnosis not present

## 2023-03-15 DIAGNOSIS — Z79899 Other long term (current) drug therapy: Secondary | ICD-10-CM | POA: Insufficient documentation

## 2023-03-15 DIAGNOSIS — Z955 Presence of coronary angioplasty implant and graft: Secondary | ICD-10-CM | POA: Insufficient documentation

## 2023-03-15 DIAGNOSIS — I5022 Chronic systolic (congestive) heart failure: Secondary | ICD-10-CM | POA: Insufficient documentation

## 2023-03-15 DIAGNOSIS — I252 Old myocardial infarction: Secondary | ICD-10-CM | POA: Insufficient documentation

## 2023-03-15 DIAGNOSIS — I493 Ventricular premature depolarization: Secondary | ICD-10-CM | POA: Insufficient documentation

## 2023-03-15 DIAGNOSIS — I110 Hypertensive heart disease with heart failure: Secondary | ICD-10-CM | POA: Insufficient documentation

## 2023-03-15 DIAGNOSIS — Z87891 Personal history of nicotine dependence: Secondary | ICD-10-CM | POA: Insufficient documentation

## 2023-03-15 DIAGNOSIS — I2510 Atherosclerotic heart disease of native coronary artery without angina pectoris: Secondary | ICD-10-CM | POA: Insufficient documentation

## 2023-03-15 DIAGNOSIS — G4733 Obstructive sleep apnea (adult) (pediatric): Secondary | ICD-10-CM | POA: Diagnosis not present

## 2023-03-15 LAB — BASIC METABOLIC PANEL
Anion gap: 10 (ref 5–15)
BUN: 24 mg/dL — ABNORMAL HIGH (ref 8–23)
CO2: 24 mmol/L (ref 22–32)
Calcium: 8.8 mg/dL — ABNORMAL LOW (ref 8.9–10.3)
Chloride: 102 mmol/L (ref 98–111)
Creatinine, Ser: 1.18 mg/dL (ref 0.61–1.24)
GFR, Estimated: >60 mL/min (ref >60)
Glucose, Bld: 225 mg/dL — ABNORMAL HIGH (ref 70–99)
Potassium: 4.1 mmol/L (ref 3.5–5.1)
Sodium: 136 mmol/L (ref 135–145)

## 2023-03-15 LAB — BRAIN NATRIURETIC PEPTIDE: B Natriuretic Peptide: 292.2 pg/mL — ABNORMAL HIGH (ref 0.0–100.0)

## 2023-03-15 MED ORDER — SPIRONOLACTONE 25 MG PO TABS
12.5000 mg | ORAL_TABLET | Freq: Every day | ORAL | 3 refills | Status: DC
  Filled 2023-03-15: qty 45, 90d supply, fill #0

## 2023-03-15 NOTE — Patient Instructions (Signed)
It was a pleasure seeing you today!  MEDICATIONS: -We are changing your medications today -Start carvedilol 6.25 mg tablets 1 tablet by mouth twice daily -Start spironolactone 1/2 tablet daily -Call if you have questions about your medications.  LABS: -We will call you if your labs need attention.  NEXT APPOINTMENT: Return to clinic in 2 weeks with pharmacy.  In general, to take care of your heart failure: -Limit your fluid intake to 2 Liters (half-gallon) per day.   -Limit your salt intake to ideally 2-3 grams (2000-3000 mg) per day. -Weigh yourself daily and record, and bring that "weight diary" to your next appointment.  (Weight gain of 2-3 pounds in 1 day typically means fluid weight.) -The medications for your heart are to help your heart and help you live longer.   -Please contact us before stopping any of your heart medications.  Call the clinic at [phone] with questions or to reschedule future appointments.

## 2023-03-17 NOTE — Progress Notes (Signed)
Remote ICD transmission.   

## 2023-03-18 ENCOUNTER — Other Ambulatory Visit: Payer: Self-pay

## 2023-03-21 ENCOUNTER — Other Ambulatory Visit: Payer: Self-pay

## 2023-03-25 NOTE — Progress Notes (Unsigned)
Advanced Heart Failure Clinic Note  PCP: Patient, No Pcp Per PCP-Cardiologist: Lorine Bears, MD HF-Cardiologist: Arvilla Meres, MD  HPI:  [name]. is a 67 y.o. male with CAD, DM2, PVCs, former smoker, previous CVA, HTN, OSA and chronic systolic HF referred to AHF clinic by Dr. Kirke Corin for further evaluation of his HF.   He has known history of CAD s/p remote MI with previous LAD,diagonal and RCA stenting at [facility]. Cath 2016 showed patent LAD and diagonal stents and chronically occluded RCA and left to right collaterals.  He also has chronic systolic heart failure due to ischemic cardiomyopathy EF 25-30%   Cath 08/2019 with 3v CAD with patent stents in the LAD and diagonal without significant restenosis, chronically occluded RCA stents with right to right and left-to-right collaterals and significant stenosis in the distal left circumflex supplying a relatively small OM 3 distribution.  Ejection fraction was 25 to 30%.  Right heart catheterization showed normal filling pressures, mild pulmonary hypertension and normal cardiac output.  Medical therapy was recommended.   Had NSTEMI 10/2019 Cath showed occluded distal left circumflex which was treated successfully with PCI and drug-eluting stent placement.     He was admitted 12/2021 with chest pain and heart failure.  Echo EF 30-35%.  Cath showed patent LAD and LCx stents with chronically occluded RCA with L-to-R collaterals.  Medical therapy recommended. He did not fill his prescriptions.  He returned to the ED after he was stung by multiple yellow jackets and was found to be in VT at 175 bpm.  He was cardioverted with 120 J.  He went back into VT and was treated successfully with IV amiodarone.     Admitted 01/2022 with VT in the setting of not taking amiodarone as prescribed.  He ultimately underwent ICD placement by Dr. Lalla Brothers in October.   First seen by AHF clinic in 06/2022. He was not taking Entresto regularly because it "dragged  him down". Stopped entresto and added eplerenone. He stopped eplerenone because it made him feel anxious and he couldn't sleep. Started on losartan as well as sertraline for anxiety.   Sleep study 07/2022 with mild OSA (AHI 9)    Echo 02/03/23 EF 30-35% mod MR RV ok.   Had cut back on lasix and was not taking regularly. Seen on 02/25/23 in Pulmonary office with 6-7 pound weight gain and increased SOB. CXR showed pulmonary edema. BNP elevated, Lasix restarted at 80 daily (was supposed to be on 40 daily but only taking a couple times/week). Also treated with prednisone and inhalers for possible COPD flare.  Seen by AHF clinic on 03/01/23.Remained SOB with mild exertion. + orthopnea, ReDS 44%. Given furosemide 80 mg daily for 3 days, then to 40 daily. Spironolactone increased to 25 mg daily at that time.  Seen by HF pharmacist on 03/15/23. Patient had issues with tolerating GDMT and had stopped taking all medications. The patient agreed to resume GDMT if started in a tiered approach. Carvedilol was restarted first per patient preference, followed by spironolactone 3 days later. ReDS was 30% and BNP had reduced to 292.2.  Today [name]. returns to Heart Failure Clinic for pharmacist medication titration. Reports overall feeling unwell. Reports continued fatigue with no energy, occassional dizziness, SOB with mild exertion, orthopnea, mild LEE, and PND. Reports symptoms have worsened over the past week. SOB is remitted with albuterol. Denies palpitations and chest pain.  Reports requiring more rest to complete all activities of daily living (ADLs).  Is active throughout the day at work, but this has become increasingly more difficult the past week. Does not weigh at home. Takes no diuretic regularly, with the last dose of furosemide being 3 d-4 days ago. Appetite is excessive. Does not follow a low sodium diet. Reports being thirsty frequently.  Current HF Medications: Carvedilol 6.25 mg BID (taking 12.5  mg daily)  Spironolactone 25 mg daily (frequent missed doses)   Has the patient been experiencing any side effects to the medications prescribed? No. Does the patient have any problems obtaining medications due to transportation or finances? No .Copays are $4 for medications.  Understanding of regimen: Poor Understanding of indications: Poor Potential of adherence: Poor. Reported stopping all medications prior to last visit and did not want to start them all back. Was initially started back on carvedilol 6.25 mg BID, then told to start spironolactone 12.5 mg daily if tolerating ok. He started both medications, but was taking carvedilol 12.5 mg only in the morning and spironolactone sporadically. Was not weighing at home nor taking furosemide appropriately.  Patient understands to avoid NSAIDs. Patient understands to avoid decongestants.  Pertinent Lab Values: Creatinine, Ser  Date Value Ref Range Status  03/15/2023 1.18 0.61 - 1.24 mg/dL Final   BUN  Date Value Ref Range Status  03/15/2023 24 (H) 8 - 23 mg/dL Final   Potassium  Date Value Ref Range Status  03/15/2023 4.1 3.5 - 5.1 mmol/L Final   Sodium  Date Value Ref Range Status  03/15/2023 136 135 - 145 mmol/L Final   B Natriuretic Peptide  Date Value Ref Range Status  03/15/2023 292.2 (H) 0.0 - 100.0 pg/mL Final    Comment:    Performed at [facility], [address] [place], [place] [address]   Magnesium  Date Value Ref Range Status  03/03/2022 2.3 1.7 - 2.4 mg/dL Final    Comment:    Performed at [facility], [address] [place], [place] [address]   TSH  Date Value Ref Range Status  06/14/2022 1.014 0.350 - 4.500 uIU/mL Final    Comment:    Performed by a 3rd Generation assay with a functional sensitivity of <=0.01 uIU/mL. Performed at [facility], [address] [place], [place] [address]     Vital Signs: There were no vitals filed for this  visit.  Assessment/Plan: 1.  Acute onc hronic systolic heart failure due to iCM - Echo 12/2021 EF 30-35%.   - Echo 02/2023 EF 30-35% mod MR, RV ok.  - s/p MDT ICD - Persistent NYHA III-IIIb in setting of non-compliance with all medications. - Weight up 7 lbs from last visit. Appears volume overloaded on exam. Physician evaluated in clinic - lung sounds with mild crackles and poor air volume suggesting both CHF and COPD contributing. - Patient was not taking furosemide when needed. Take 80 mg of furosemide today, then resume furosemide 40 mg daily. Discussed with physician in clinic. Patient was instructed to go to the ER if symptoms do not improve tomorrow. - Continue carvedilol 6.25 bid. Reinforced the importance of twice daily dosing. - BP is elevated and patient is willing to try Entresto again. Start Entresto 24-26 mg bid.  - Continue spironolactone at 12.5 daily. Was historically unable to tolerate eplerenone due to anxiety. - He did not tolerate Jardiance in the past due to fatigue and weakness. Is willing to try [place]. Also with the compelling indication of T2DM. Reporting symptoms of hyperglycemia, will check A1c again  today. Marcelline Deist may be ideal to add at next visit. - Check BMP, BNP, and A1c today. May make adjustments pending labs. Repeat BMP at MD visit.   2. CAD  - s/p remote MI. S/p PCI LAD, LCx and RCA - Cath 12/2021: patent LAD and left circumflex stents with chronically occluded right coronary artery with left-to-right collaterals.  Medical therapy was recommended. - No s/s angina  - Instructed to continue antiplatelet and antilipid therapy. He didn't tolerate statin.  - Follows with Dr. Kirke Corin    3  Essential hypertension:  - Blood pressure up today. Start [place] as above.   4. H/o ventricular tachycardia: - Status post ICD placement.   - Taking amiodarone as prescribed   5. H/o PVCs - Zio 09/2019: Avg hr 70, 4 runs NSVT, 5 runs SVT, rare PACs, frequent PVCs w/ 11.8%  burden. - MD to consider repeat Zio. - Amiodarone as above.   6. Mild OSA - Sleep study 07/2022 with mild OSA (AHI 9)   7. T2DM -Last A1c in 2023 was 8.1. Symptoms of hyperglycemia increased. Check A1c today. Good candidate for [place].   8. Anxiety - Symptoms improved with Zoloft 50 but he stopped it - Hs not seen PCP in some time. Encouraged follow up.   Follow up: Labs today. Visit with AHF MD on 04/15/23. Will need labs at that time  Thank you for involving pharmacy in this patient's care.  Enos Fling, PharmD, BCPS Phone - [phone] Clinical Pharmacist 03/25/2023 12:53 PM

## 2023-03-30 ENCOUNTER — Other Ambulatory Visit: Payer: Self-pay

## 2023-03-30 ENCOUNTER — Ambulatory Visit: Payer: Medicaid Other | Attending: Family | Admitting: Pharmacist

## 2023-03-30 VITALS — BP 142/82 | HR 63 | Wt 213.0 lb

## 2023-03-30 DIAGNOSIS — I5022 Chronic systolic (congestive) heart failure: Secondary | ICD-10-CM

## 2023-03-30 MED ORDER — FUROSEMIDE 40 MG PO TABS
40.0000 mg | ORAL_TABLET | Freq: Every day | ORAL | 3 refills | Status: DC
  Filled 2023-03-30: qty 90, 90d supply, fill #0

## 2023-03-30 MED ORDER — CARVEDILOL 6.25 MG PO TABS
6.2500 mg | ORAL_TABLET | Freq: Two times a day (BID) | ORAL | 3 refills | Status: DC
  Filled 2023-03-30: qty 60, 30d supply, fill #0

## 2023-03-30 MED ORDER — AMIODARONE HCL 200 MG PO TABS
200.0000 mg | ORAL_TABLET | Freq: Every day | ORAL | 1 refills | Status: DC
  Filled 2023-03-30: qty 90, 90d supply, fill #0

## 2023-03-30 MED ORDER — ENTRESTO 24-26 MG PO TABS
1.0000 | ORAL_TABLET | Freq: Two times a day (BID) | ORAL | 11 refills | Status: DC
  Filled 2023-03-30: qty 60, 30d supply, fill #0

## 2023-03-30 MED ORDER — SPIRONOLACTONE 25 MG PO TABS
12.5000 mg | ORAL_TABLET | Freq: Every day | ORAL | 3 refills | Status: DC
  Filled 2023-03-30: qty 90, 180d supply, fill #0

## 2023-03-30 NOTE — Patient Instructions (Signed)
It was a pleasure seeing you today!  MEDICATIONS: -We are changing your medications today -Start Entresto 24-26 mg 1 tablet twice daily -Continue carvedilol 6.25 mg 1 tablet twice daily -Continue spironolactone 12.5 mg daily -Restart furosemide 40 mg daily -Call if you have questions about your medications.  LABS: -We will call you if your labs need attention.  NEXT APPOINTMENT: Return to clinic in 2 weeks with Dr. Gala Romney.  In general, to take care of your heart failure: -Limit your fluid intake to 2 Liters (half-gallon) per day.   -Limit your salt intake to ideally 2-3 grams (2000-3000 mg) per day. -Weigh yourself daily and record, and bring that "weight diary" to your next appointment.  (Weight gain of 2-3 pounds in 1 day typically means fluid weight.) -The medications for your heart are to help your heart and help you live longer.   -Please contact us before stopping any of your heart medications.  Call the clinic at [phone] with questions or to reschedule future appointments.

## 2023-03-31 LAB — BASIC METABOLIC PANEL
BUN/Creatinine Ratio: 18 (ref 10–24)
BUN: 19 mg/dL (ref 8–27)
CO2: 18 mmol/L — ABNORMAL LOW (ref 20–29)
Calcium: 8.6 mg/dL (ref 8.6–10.2)
Chloride: 107 mmol/L — ABNORMAL HIGH (ref 96–106)
Creatinine, Ser: 1.04 mg/dL (ref 0.76–1.27)
Glucose: 283 mg/dL — ABNORMAL HIGH (ref 70–99)
Potassium: 4 mmol/L (ref 3.5–5.2)
Sodium: 144 mmol/L (ref 134–144)
eGFR: 79 mL/min/{1.73_m2} (ref >59)

## 2023-03-31 LAB — HEMOGLOBIN A1C
Est. average glucose Bld gHb Est-mCnc: 217 mg/dL
Hgb A1c MFr Bld: 9.2 % — ABNORMAL HIGH (ref 4.8–5.6)

## 2023-03-31 LAB — BRAIN NATRIURETIC PEPTIDE: BNP: 452.5 pg/mL — ABNORMAL HIGH (ref 0.0–100.0)

## 2023-04-01 ENCOUNTER — Telehealth: Payer: Self-pay

## 2023-04-01 NOTE — Telephone Encounter (Signed)
-----   Message from Delma Freeze sent at 03/31/2023  3:50 PM EDT ----- A1c is elevated so please follow-up with PCP regarding diabetes. Sodium, potassium and kidney function look great!

## 2023-04-15 ENCOUNTER — Ambulatory Visit: Payer: Medicaid Other | Attending: Internal Medicine | Admitting: Internal Medicine

## 2023-04-15 VITALS — BP 157/89 | HR 77 | Wt 210.0 lb

## 2023-04-15 DIAGNOSIS — I493 Ventricular premature depolarization: Secondary | ICD-10-CM

## 2023-04-15 DIAGNOSIS — I472 Ventricular tachycardia, unspecified: Secondary | ICD-10-CM | POA: Diagnosis not present

## 2023-04-15 DIAGNOSIS — I5022 Chronic systolic (congestive) heart failure: Secondary | ICD-10-CM

## 2023-04-15 DIAGNOSIS — I2510 Atherosclerotic heart disease of native coronary artery without angina pectoris: Secondary | ICD-10-CM

## 2023-04-15 DIAGNOSIS — I10 Essential (primary) hypertension: Secondary | ICD-10-CM

## 2023-04-15 NOTE — Progress Notes (Signed)
ADVANCED HF CLINIC NOTE  Referring Physician: Lorine Bears, MD Primary Care: Patient, No Pcp Per Primary Cardiologist: Lorine Bears, MD   HPI:  [name]. is a 67 y.o. male with CAD, DM2, PVCs, former smoker, previous CVA, HTN, OSA and chronic systolic HF referred by Dr. Kirke Corin for further evaluation of his HF.  He has known history of CAD s/p  remote MI with previous LAD,diagonal and RCA stenting at [facility]. Cath 2016 showed patent LAD and diagonal stents and chronically occluded RCA and left to right collaterals.  He also has chronic systolic heart failure due to ischemic cardiomyopathy EF 25-30%  Cath 2/21 with 3v CAD with patent stents in the LAD and diagonal without significant restenosis, chronically occluded RCA stents with right to right and left-to-right collaterals and significant stenosis in the distal left circumflex supplying a relatively small OM 3 distribution.  Ejection fraction was 25 to 30%.  Right heart catheterization showed normal filling pressures, mild pulmonary hypertension and normal cardiac output.  Medical therapy was recommended.  Had NSTEMI 4/21 Cath showed occluded distal left circumflex which was treated successfully with PCI and drug-eluting stent placement.     He was admitted 6/23 with chest pain and heart failure.  Echo EF 30-35%.  Cath showed patent LAD and LCx stents with chronically occluded RCA with L-to-R collaterals.  Medical therapy recommended. He did not fill his prescriptions.  He returned to the ED after he was stung by multiple yellow jackets and was found to be in VT at 175 bpm.  He was cardioverted with 120 J.  He went back into VT and was treated successfully with IV amiodarone.     Admitted 7/23 with VT in the setting of not taking amiodarone as prescribed.  He ultimately underwent ICD placement by Dr. Lalla Brothers in October.  I saw him for the first time in 12/23. He was not taking Entresto regularly because it dragged him down. Stopped  entresto and added eplerenone. He stopped eplerenone because it made him feel anxious and he couldn't sleep. I started losartan as well as sertraline for anxiety  Sleep study 1/24 with mild OSA (AHI 9)   Echo 02/03/23 EF 30-35% mod MR RV ok. Personally reviewed  Had cut back on lasix and was not taking regularly. Seen on 02/25/23 in Pulmonary office with 6-7 pound weight gain and increased SOB. CXR showed pulmonary edema. BNP elevated, Lasix restarted at 80 daily (was supposed to be on 40 daily but only taking a couple times/week). Also treated with prednisone and inhalers for possible COPD flare  Here for f/u. He appears very angry and upset Says he quit taking all of his medications. Says he got sick and tired of feeling sick and tired. Says he feels much better off meds. Denies SOB or edema. No CP. Weight stable  After I examined patient he got frustrated with me and got up from his chair and walked out saying he was frustrated with me about asking all my questions and talking about his medications. He said he would find a another heart doctor.      Past Medical History:  Diagnosis Date   Anginal pain (HCC)    Arthritis    CAD (coronary artery disease)    a. s/p MI with LAD and Diag stenting @ [name];  b. 06/2015 Cath: LAD 38m/d ISR, 100 RCA (ISR) w/ L->R collats, otw mod nonobs dzs-->Med Rx; c. 08/2019 Cath: LM nl, LAD 10p/m ISR, D1 20, D2  100, RI min irregs, LCX 55m/d, OM1 100, OM2 50, RCA 100p, 70d. RPDA fills via collats from LAD. EF 25-35%-->Med Rx; d. 10/2019 NSTEMI/Cath: LCX now 100 (2.75x15 Resolute Onyx DES), otw stable compared to 08/2019.   Chronic combined systolic and diastolic CHF (congestive heart failure) (HCC)    a. 06/2015 Echo: EF 20-25%, Gr3 DD; b. 10/2019 Echo: EF 25-30%, glob HK, sev inf/infapical HK. Mod dil LA.   Dyspnea    Essential hypertension    Headache 12/23/2021   Hyperlipidemia    Hypokalemia    a. 06/2015 in setting of diuresis.   Ischemic cardiomyopathy    a.  2011 EF 45% ([name]);  b. 06/2015 Echo: EF 20-25%; c. 04/2019 Echo: EF 40-45%; d. 08/2019 LV gram: EF 25-35%; e. 10/2019 Echo: EF 25-30%.   PVC's (premature ventricular contractions)    a. 09/2019 Zio (3 days): Avg hr 70, 4 runs NSVT, 5 runs SVT, rare PACs, frequent PVCs w/ 11.8% burden.   Sleep apnea    Stroke [facility])    Stroke/Right temporal lobe infarction [facility])    a. 10/2019 MRI brain: 1cm acute ischemic nonhemorrhagic R temporal lobe infarct. Age-related cerebral atrophy w/ moderate chronic small vessel ischemic dzs.   Type 2 diabetes mellitus with hyperglycemia (HCC) 10/08/2019    Current Outpatient Medications  Medication Sig Dispense Refill   albuterol (VENTOLIN HFA) 108 (90 Base) MCG/ACT inhaler Inhale 2 puffs into the lungs every 6 (six) hours as needed for wheezing or shortness of breath. 18 g 2   amiodarone (PACERONE) 200 MG tablet Take 1 tablet (200 mg total) by mouth daily. 90 tablet 1   aspirin EC 81 MG tablet Take 1 tablet (81 mg total) by mouth daily. Swallow whole. 90 tablet 3   carvedilol (COREG) 6.25 MG tablet Take 1 tablet (6.25 mg total) by mouth 2 (two) times daily with a meal. 60 tablet 3   ezetimibe (ZETIA) 10 MG tablet Take 1 tablet (10 mg total) by mouth daily. 90 tablet 3   furosemide (LASIX) 40 MG tablet Take 1 tablet (40 mg total) by mouth daily. 90 tablet 3   gabapentin (NEURONTIN) 100 MG capsule Take 200 mg by mouth at bedtime.     gabapentin (NEURONTIN) 300 MG capsule Take 1 capsule (300 mg total) by mouth at bedtime. 90 capsule 1   losartan (COZAAR) 50 MG tablet Take 1 tablet (50 mg total) by mouth daily. 90 tablet 3   methocarbamol (ROBAXIN) 500 MG tablet Take 1 tablet (500 mg total) by mouth 3 (three) times daily as needed. 90 tablet 0   nitroGLYCERIN (NITROSTAT) 0.4 MG SL tablet Place 1 tablet (0.4 mg total) under the tongue every 5 (five) minutes as needed for chest pain. 90 tablet 3   phenylephrine (NEO-SYNEPHRINE) 0.5 % nasal solution Place 1 drop into both  nostrils at bedtime.     potassium chloride SA (KLOR-CON M) 20 MEQ tablet Take 1 tablet (20 mEq total) by mouth daily. 30 tablet 3   sacubitril-valsartan (ENTRESTO) 24-26 MG Take 1 tablet by mouth 2 (two) times daily. 60 tablet 11   sertraline (ZOLOFT) 50 MG tablet Take 1 tablet (50 mg total) by mouth daily. 90 tablet 0   sodium chloride (OCEAN) 0.65 % SOLN nasal spray Place 1 spray into both nostrils as needed for congestion.     spironolactone (ALDACTONE) 25 MG tablet Take 0.5 tablets (12.5 mg total) by mouth daily. 90 tablet 3   Tiotropium Bromide-Olodaterol (STIOLTO RESPIMAT) 2.5-2.5 MCG/ACT AERS Inhale  2 puffs into the lungs daily. 8 g 0   Tiotropium Bromide-Olodaterol (STIOLTO RESPIMAT) 2.5-2.5 MCG/ACT AERS Inhale 2 puffs into the lungs daily. 4 g 11   No current facility-administered medications for this visit.    Allergies  Allergen Reactions   Contrast Media [Iodinated Contrast Media] Shortness Of Breath   Iohexol Shortness Of Breath     Onset Date: [id number]    Jardiance [Empagliflozin] Other (See Comments)    Fatigue/weakness   Atorvastatin Other (See Comments)    Myalgias    Hydrocodone Itching   Morphine And Codeine Other (See Comments)    Lost control    Penicillins Other (See Comments)    Unknown reaction Did it involve swelling of the face/tongue/throat, SOB, or low BP? Unknown Did it involve sudden or severe rash/hives, skin peeling, or any reaction on the inside of your mouth or nose? Unknown Did you need to seek medical attention at a hospital or doctor's office? Yes When did it last happen?      Childhood allergy  If all above answers are "NO", may proceed with cephalosporin use.    Rosuvastatin Other (See Comments)    Myalgias       Social History   Socioeconomic History   Marital status: Widowed    Spouse name: Not on file   Number of children: 2   Years of education: Not on file   Highest education level: GED or equivalent  Occupational History    Occupation: Statistician   Tobacco Use   Smoking status: Former    Current packs/day: 0.00    Types: Cigarettes    Quit date: 06/10/2011    Years since quitting: 11.8   Smokeless tobacco: Never  Substance and Sexual Activity   Alcohol use: No    Alcohol/week: 0.0 standard drinks of alcohol   Drug use: No   Sexual activity: Not Currently  Other Topics Concern   Not on file  Social History Narrative   Lives alone.   Social Determinants of Health   Financial Resource Strain: Low Risk  (05/11/2022)   Received from [facility], [facility]   Overall Financial Resource Strain (CARDIA)    Difficulty of Paying Living Expenses: Not very hard  Food Insecurity: No Food Insecurity (05/11/2022)   Received from [facility], [facility]   Hunger Vital Sign    Worried About Running Out of Food in the Last Year: Never true    Ran Out of Food in the Last Year: Never true  Transportation Needs: No Transportation Needs (05/11/2022)   Received from [facility], [facility]   [facility] - Transportation    Lack of Transportation (Medical): No    Lack of Transportation (Non-Medical): No  Physical Activity: Inactive (04/30/2019)   Exercise Vital Sign    Days of Exercise per Week: 0 days    Minutes of Exercise per Session: 0 min  Stress: No Stress Concern Present (04/30/2019)   [employer] of Occupational Health - Occupational Stress Questionnaire    Feeling of Stress : Not at all  Social Connections: Moderately Isolated (04/30/2019)   Social Connection and Isolation Panel [NHANES]    Frequency of Communication with Friends and Family: More than three times a week    Frequency of Social Gatherings with Friends and Family: More than three times a week    Attends Religious Services: Never    Database administrator or Organizations: Yes    Attends [employer]  or Organization Meetings: Never    Marital Status: Divorced  Catering manager Violence: Not At Risk (04/30/2019)    Humiliation, Afraid, Rape, and Kick questionnaire    Fear of Current or Ex-Partner: No    Emotionally Abused: No    Physically Abused: No    Sexually Abused: No      Family History  Problem Relation Age of Onset   Coronary artery disease Mother    Diabetes Mother    Coronary artery disease Father     Vitals:   04/15/23 1116  BP: (!) 157/89  Pulse: 77  SpO2: 99%  Weight: 210 lb (95.3 kg)   Wt Readings from Last 3 Encounters:  04/15/23 210 lb (95.3 kg)  03/30/23 213 lb (96.6 kg)  03/15/23 206 lb 9.6 oz (93.7 kg)    PHYSICAL EXAM: General:  Sitting in chair No resp difficulty HEENT: normal Neck: supple. no JVD. Carotids 2+ bilat; no bruits. No lymphadenopathy or thryomegaly appreciated. Cor: PMI nondisplaced. Regular rate & rhythm. No rubs, gallops or murmurs. Lungs: clear but decreased Abdomen: obese soft, nontender, nondistended. No hepatosplenomegaly. No bruits or masses. Good bowel sounds. Extremities: no cyanosis, clubbing, rash, edema Neuro: alert & orientedx3, cranial nerves grossly intact. moves all 4 extremities w/o difficulty. Affect flat/angry  ICD interrogation:Not done    ASSESSMENT & PLAN:  1.  Chronic systolic heart failure due to iCM - Echo 6/23 EF 30-35%.   - Echo 02/03/23 EF 30-35% mod MR RV ok. Personally reviewed - s/p MDT ICD - Reports NYHA II symptoms after stopping all his medications.  - Volume status ok despite being off lasix (suspect that won't last) - Currently off all HF meds including: carvedilol to 6.25 bid, losartan 50 bid, spiro 25 daily  - Unable to tolerate eplerenone due to anxiety. Unable to tolerate Entresto due to fatigue - He did not tolerate Jardiance in the past due to fatigue and weakness.   - After I examined patient he got frustrated with me and got up from his chair and walked out saying he was frustrated with me about asking all my questions and talking about his medications. He said he would find a another heart doctor.      2. CAD  - s/p remote MI. S/p PCI LAD, LCx and RCA - Cath 6/23: patent LAD and left circumflex stents with chronically occluded right coronary artery with left-to-right collaterals.  Medical therapy was recommended. - No s/s angina - Continue DAPT. He doesn't tolerate statin - Follows with Dr. Kirke Corin   3  Essential hypertension:  - BP up after being off all meds. Not willing to restart despite my discussion with him   4. H/o ventricular tachycardia: - Status post ICD placement.   - he has stopped amio   5. H/o PVCs - Zio 3/21: Avg hr 70, 4 runs NSVT, 5 runs SVT, rare PACs, frequent PVCs w/ 11.8% burden.   6. Mild OSA - Sleep study 1/24 with mild OSA (AHI 9)   7. Anxiety - Symptoms improved with  Zoloft 50 but he stopped it - Follows with PCP  He walked out of office before visit was complete. Says he wants to find another heart doctor. We will not make scheduled f/u visit for him. Can return if he would like.   Arvilla Meres, MD  11:34 AM

## 2023-05-12 ENCOUNTER — Other Ambulatory Visit: Payer: Self-pay

## 2023-05-12 MED ORDER — FLUAD 0.5 ML IM SUSY
0.5000 mL | PREFILLED_SYRINGE | Freq: Once | INTRAMUSCULAR | 0 refills | Status: AC
  Filled 2023-05-12: qty 0.5, 1d supply, fill #0

## 2023-05-12 MED ORDER — GABAPENTIN 300 MG PO CAPS
300.0000 mg | ORAL_CAPSULE | Freq: Two times a day (BID) | ORAL | 1 refills | Status: DC
  Filled 2023-05-12: qty 180, 90d supply, fill #0

## 2023-06-07 ENCOUNTER — Ambulatory Visit (INDEPENDENT_AMBULATORY_CARE_PROVIDER_SITE_OTHER): Payer: Medicaid Other

## 2023-06-07 ENCOUNTER — Other Ambulatory Visit: Payer: Self-pay

## 2023-06-07 DIAGNOSIS — I255 Ischemic cardiomyopathy: Secondary | ICD-10-CM

## 2023-06-08 ENCOUNTER — Ambulatory Visit: Payer: Medicaid Other | Admitting: Pulmonary Disease

## 2023-06-08 ENCOUNTER — Encounter: Payer: Self-pay | Admitting: Pulmonary Disease

## 2023-06-08 ENCOUNTER — Other Ambulatory Visit: Payer: Self-pay

## 2023-06-08 VITALS — BP 138/84 | HR 74 | Temp 97.7°F | Ht 66.0 in | Wt 211.2 lb

## 2023-06-08 DIAGNOSIS — R0602 Shortness of breath: Secondary | ICD-10-CM | POA: Diagnosis not present

## 2023-06-08 DIAGNOSIS — I5022 Chronic systolic (congestive) heart failure: Secondary | ICD-10-CM

## 2023-06-08 DIAGNOSIS — Z23 Encounter for immunization: Secondary | ICD-10-CM

## 2023-06-08 DIAGNOSIS — J449 Chronic obstructive pulmonary disease, unspecified: Secondary | ICD-10-CM | POA: Diagnosis not present

## 2023-06-08 DIAGNOSIS — Z91148 Patient's other noncompliance with medication regimen for other reason: Secondary | ICD-10-CM

## 2023-06-08 LAB — CUP PACEART REMOTE DEVICE CHECK
Battery Remaining Longevity: 104 mo
Battery Voltage: 3.01 V
Brady Statistic AP VP Percent: 0.03 %
Brady Statistic AP VS Percent: 44.44 %
Brady Statistic AS VP Percent: 0.02 %
Brady Statistic AS VS Percent: 55.51 %
Brady Statistic RA Percent Paced: 43.86 %
Brady Statistic RV Percent Paced: 0.05 %
Date Time Interrogation Session: 20241203022724
HighPow Impedance: 73 Ohm
Implantable Lead Connection Status: 753985
Implantable Lead Connection Status: 753985
Implantable Lead Implant Date: 20231004
Implantable Lead Implant Date: 20231004
Implantable Lead Location: 753859
Implantable Lead Location: 753860
Implantable Lead Model: 5076
Implantable Pulse Generator Implant Date: 20231004
Lead Channel Impedance Value: 380 Ohm
Lead Channel Impedance Value: 437 Ohm
Lead Channel Impedance Value: 494 Ohm
Lead Channel Pacing Threshold Amplitude: 0.625 V
Lead Channel Pacing Threshold Amplitude: 0.875 V
Lead Channel Pacing Threshold Pulse Width: 0.4 ms
Lead Channel Pacing Threshold Pulse Width: 0.4 ms
Lead Channel Sensing Intrinsic Amplitude: 2.25 mV
Lead Channel Sensing Intrinsic Amplitude: 2.25 mV
Lead Channel Sensing Intrinsic Amplitude: 31.625 mV
Lead Channel Sensing Intrinsic Amplitude: 31.625 mV
Lead Channel Setting Pacing Amplitude: 1.5 V
Lead Channel Setting Pacing Amplitude: 1.5 V
Lead Channel Setting Pacing Pulse Width: 0.4 ms
Lead Channel Setting Sensing Sensitivity: 0.3 mV
Zone Setting Status: 755011

## 2023-06-08 MED ORDER — STIOLTO RESPIMAT 2.5-2.5 MCG/ACT IN AERS
2.0000 | INHALATION_SPRAY | Freq: Every day | RESPIRATORY_TRACT | Status: DC
Start: 2023-06-08 — End: 2023-09-27

## 2023-06-08 MED ORDER — VENTOLIN HFA 108 (90 BASE) MCG/ACT IN AERS
2.0000 | INHALATION_SPRAY | Freq: Four times a day (QID) | RESPIRATORY_TRACT | 2 refills | Status: DC | PRN
  Filled 2023-06-08 – 2023-09-19 (×2): qty 54, 75d supply, fill #0

## 2023-06-08 MED ORDER — STIOLTO RESPIMAT 2.5-2.5 MCG/ACT IN AERS
2.0000 | INHALATION_SPRAY | Freq: Every day | RESPIRATORY_TRACT | 11 refills | Status: DC
  Filled 2023-06-08: qty 4, 30d supply, fill #0
  Filled 2023-09-19: qty 4, 30d supply, fill #1
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
## 2023-06-08 NOTE — Patient Instructions (Signed)
VISIT SUMMARY:  Today, we discussed your ongoing health concerns, including COPD, chronic congestive heart failure, and obstructive sleep apnea. We reviewed your current medications and made some adjustments to help manage your symptoms more effectively. We also talked about your recent experiences with vaccinations and planned for future preventive care.  YOUR PLAN:  -CHRONIC OBSTRUCTIVE PULMONARY DISEASE (COPD): COPD is a chronic lung disease that makes it hard to breathe. You should use your Stiolto inhaler daily to help manage your symptoms. We provided additional samples and sent a prescription to your pharmacy. Continue using albuterol as needed for sudden breathing difficulties.  -CHRONIC CONGESTIVE HEART FAILURE (CHF): CHF is a condition where the heart doesn't pump blood as well as it should. We are concerned that low oxygen levels at night might be making your heart work harder. We will test your overnight oxygen levels at home to monitor this.  -OBSTRUCTIVE SLEEP APNEA (OSA): OSA is a sleep disorder where breathing repeatedly stops and starts during sleep. Since you are unable to tolerate CPAP, we will test your overnight oxygen levels to see how it affects your heart.  -GENERAL HEALTH MAINTENANCE: You had a significant reaction to a high-dose flu shot. In the future, you should request a regular strength flu shot to minimize side effects. Today, we administered the Prevnar 20 vaccine to protect you against pneumonia.  INSTRUCTIONS:  Please use your Stiolto inhaler daily and continue using albuterol as needed. We will conduct an overnight oxygen level test at home to monitor your oxygen levels while you sleep. Schedule a follow-up appointment in three months.

## 2023-06-14 ENCOUNTER — Ambulatory Visit: Payer: Medicaid Other | Admitting: Student

## 2023-07-05 ENCOUNTER — Telehealth: Payer: Self-pay

## 2023-07-05 DIAGNOSIS — G4734 Idiopathic sleep related nonobstructive alveolar hypoventilation: Secondary | ICD-10-CM

## 2023-07-05 NOTE — Telephone Encounter (Signed)
 ONO reviewed by Dr. Jayme Cloud - Low SpO2 84.0%. Patient qualifies for O2 at night. 2L with sleep.  Lm x1 for the patient.

## 2023-07-05 NOTE — Telephone Encounter (Signed)
 I have notified the patient and placed the order for oxygen at night.  Nothing further needed.

## 2023-07-13 ENCOUNTER — Encounter: Payer: Self-pay | Admitting: Cardiology

## 2023-07-13 ENCOUNTER — Other Ambulatory Visit: Payer: Self-pay

## 2023-07-13 ENCOUNTER — Ambulatory Visit: Payer: Medicaid Other | Attending: Cardiology | Admitting: Cardiology

## 2023-07-13 VITALS — BP 158/98 | HR 74 | Ht 66.0 in | Wt 210.1 lb

## 2023-07-13 DIAGNOSIS — I255 Ischemic cardiomyopathy: Secondary | ICD-10-CM

## 2023-07-13 DIAGNOSIS — Z95810 Presence of automatic (implantable) cardiac defibrillator: Secondary | ICD-10-CM | POA: Diagnosis not present

## 2023-07-13 DIAGNOSIS — I5022 Chronic systolic (congestive) heart failure: Secondary | ICD-10-CM

## 2023-07-13 DIAGNOSIS — I472 Ventricular tachycardia, unspecified: Secondary | ICD-10-CM

## 2023-07-13 NOTE — Patient Instructions (Signed)
 Medication Instructions:   Restart Amiodarone  - Take one tablet ( 200mg ) by mouth daily.  Restart Spirolactone - Take half tablet ( 12.5mg ) by mouth daily.   *If you need a refill on your cardiac medications before your next appointment, please call your pharmacy*   Lab Work:  CMP / MAG / BNP / TSH / T4 If you have labs (blood work) drawn today and your tests are completely normal, you will receive your results only by: MyChart Message (if you have MyChart) OR A paper copy in the mail If you have any lab test that is abnormal or we need to change your treatment, we will call you to review the results.   Testing/Procedures:  None Ordered    Follow-Up: At [facility], you and your health needs are our priority.  As part of our continuing mission to provide you with exceptional heart care, we have created designated Provider Care Teams.  These Care Teams include your primary Cardiologist (physician) and Advanced Practice Providers (APPs -  Physician Assistants and Nurse Practitioners) who all work together to provide you with the care you need, when you need it.  We recommend signing up for the patient portal called MyChart.  Sign up information is provided on this After Visit Summary.  MyChart is used to connect with patients for Virtual Visits (Telemedicine).  Patients are able to view lab/test results, encounter notes, upcoming appointments, etc.  Non-urgent messages can be sent to your provider as well.   To learn more about what you can do with MyChart, go to [email or web address].    Your next appointment:    3-5 weeks with heart failure - [place] 6 month follow up - [place]

## 2023-07-13 NOTE — Progress Notes (Signed)
 Electrophysiology Clinic Note    Date:  07/14/2023  Patient ID:  Glen Ribas., DOB 09/27/1955, MRN [id number] PCP:  Patient, No Pcp Per  Cardiologist:  [name], MD HF Cardiologist: Bensimhon Electrophysiologist: Glen ONEIDA HOLTS, MD    Discussed the use of AI scribe software for clinical note transcription with the patient, who gave verbal consent to proceed.   Patient Profile    Chief Complaint: device follow-up  History of Present Illness: Glen Dragoo. is a 68 y.o. male with PMH notable for HFrEF, VT s/p ICD, CAD s/p PCI, PVC, HTN, OSA, T2DM; seen today for Glen ONEIDA HOLTS, MD for routine electrophysiology followup.  Last saw Dr. Holts 07/2022, saw HF MD recently 04/2023, he had stopped all medications and was feeling ok. Not interested in restarting meds.  On follow-up today, he is having worsening SOB and bloating with abdominal fullness. He has stopped taking almost all of his medications except lasix  that he's taking PRN, and his stiolo inhaler. Around [place] of last year he forgot to [facility] his medications one morning and stated he felt amazing - much better energy. Due to this, he decided to stop everything.   He denies palpitations, dizziness. He has reduced appetite. He has to sleep in a semi-upright position d/t difficulty breathing when laying flat.  He works as a chiropractor and is not able to take frequent bathroom breaks while working    Device Information: MDT dual chamber ICD, imp 04/2022; dx ICM, VT  AAD History: Amiodarone      ROS:  Please see the history of present illness. All other systems are reviewed and otherwise negative.    Physical Exam    VS:  BP (!) 158/98 (BP Location: Left Arm, Patient Position: Sitting, Cuff Size: Large)   Pulse 74   Ht 5' 6 (1.676 m)   Wt 210 lb 2 oz (95.3 kg)   SpO2 98%   BMI 33.92 kg/m  BMI: Body mass index is 33.92 kg/m.  Wt Readings from Last 3 Encounters:  07/13/23 210 lb 2 oz  (95.3 kg)  06/08/23 211 lb 3.2 oz (95.8 kg)  04/15/23 210 lb (95.3 kg)     GEN- The patient is well appearing, alert and oriented x 3 today.   Lungs- rhonchi throughout, normal work of breathing.  Heart- Regular rate and rhythm, no murmurs, rubs or gallops Extremities- 1+ peripheral edema, warm, dry Skin-   device pocket well-healed, no tethering   Device interrogation done today and reviewed by myself:  Battery 8.6 years Lead thresholds, impedence, sensing stable  Several VT episodes, longest 6 min in 04/2023; none since No changes made today   Studies Reviewed   Previous EP, cardiology notes.    EKG is ordered. Personal review of EKG from today shows:    EKG Interpretation Date/Time:  Wednesday July 13 2023 14:21:19 EST Ventricular Rate:  74 PR Interval:  186 QRS Duration:  122 QT Interval:  436 QTC Calculation: 483 R Axis:   95  Text Interpretation: Normal sinus rhythm Rightward axis Left ventricular hypertrophy Confirmed by [name] [id number]) on 07/13/2023 2:23:04 PM    TTE, 02/03/2023  1. Left ventricular ejection fraction, by estimation, is 30 to 35%. Left ventricular ejection fraction by 2D MOD biplane is 31.4 %. The left ventricle has moderately decreased function. The left ventricle demonstrates moderate global hypokinesis with severe hypokinesis of the inferior, inferolateral and apical region. The left ventricular internal cavity size was moderately  dilated. The average left ventricular global longitudinal strain is -6.8 %.   2. Right ventricular systolic function is normal. The right ventricular size is normal. There is mildly elevated pulmonary artery systolic pressure. The estimated right ventricular systolic pressure is 40.3 mmHg.   3. Left atrial size was severely dilated.   4. The mitral valve is normal in structure. Moderate mitral valve regurgitation. No evidence of mitral stenosis.   5. The aortic valve is tricuspid. There is mild calcification of the  aortic valve. Aortic valve regurgitation is not visualized. Aortic valve sclerosis is present, with no evidence of aortic valve stenosis.   6. There is borderline dilatation of the aortic root, measuring 39 mm.   7. The inferior vena cava is normal in size with greater than 50% respiratory variability, suggesting right atrial pressure of 3 mmHg.   RHC, 12/30/2021 Conclusions: Severe multivessel coronary artery disease, overall relatively similar to most recent cath in 2021.  Mid LCx stent placed at that time demonstrates mild in-stent restenosis in the proximal segment.  Eccentric ostial OM2 lesion appears similar. Mildly elevated left heart and pulmonary artery pressures. Mildly reduced Fick cardiac output/index.   Recommendations: Continue indefinite DAPT with aspirin  and clopidogrel . Continue gentle diuresis; I will add back furosemide  40 mg daily.  Escalate GDMT for HFrEF, as blood pressure and renal function allow. Add ranolazine  500 mg BID for antianginal therapy.  EKG to be obtained tomorrow AM to reassess QT interval. Consider pulmonary consultation, as degree of dyspnea seems to be out of proportion to heart failure/coronary artery disease. If the patient has refractory symptoms in spite of aforementioned interventions, PCI to OM2 may need to be considered.  If possible, this should be deferred for at least 2 weeks from the time of recent brain MRI demonstrating acute stroke in order to minimize risk for periprocedural intracranial hemorrhage. Aggressive secondary prevention of coronary artery disease.     Assessment and Plan    #) medication non-compliance Patient stated that he felt terribly a few months ago and thinks it was because of too many medications. We agreed to slowly add some medications back to see if we could identify a cause of h  #) ICM #) VT #) high risk medication use - amiodarone  #) ICD in situ VT episodes noted on device interrogation, none  recently Recommended to restart amiodarone  200mg  daily, patient agreeable to this Updated LFTs, thyroid  labs today all stable   #) HFrEF Fluid overloaded by exam and patient symptoms - SOB, bloating, orthopnea Optivol is overall low, but increasing Recommended to take lasix  more often when feeling bloating and abd fullness. Discussed to take it on days that he does not work, or immediately after working so that he can sleep overnight Restart 12.5mg  spiro daily.  He is agreeable to re-establish with HF team, prefers to not see Dr. Cherrie. CMP and BNP today       Current medicines are reviewed at length with the patient today.   The patient has concerns regarding his medicines.  The following changes were made today:   RESTART amiodarone  200mg  daily RESTART 12.5mg  spironolactone   Labs/ tests ordered today include:  Orders Placed This Encounter  Procedures   Comp Met (CMET)   Magnesium    B Nat Peptide   TSH   T4, free   EKG 12-Lead     Disposition: Follow up with Dr. Cindie or EP APP in 6 months  Follow-up in 2-4 wks with Dr. Gardenia to establish with  new HF MD    Signed, Chantal Needle, NP  07/14/23  8:16 AM  Electrophysiology [facility]

## 2023-07-14 LAB — COMPREHENSIVE METABOLIC PANEL
ALT: 6 [IU]/L (ref 0–44)
AST: 12 [IU]/L (ref 0–40)
Albumin: 4.4 g/dL (ref 3.9–4.9)
Alkaline Phosphatase: 96 [IU]/L (ref 44–121)
BUN/Creatinine Ratio: 15 (ref 10–24)
BUN: 20 mg/dL (ref 8–27)
Bilirubin Total: 0.9 mg/dL (ref 0.0–1.2)
CO2: 21 mmol/L (ref 20–29)
Calcium: 9.4 mg/dL (ref 8.6–10.2)
Chloride: 101 mmol/L (ref 96–106)
Creatinine, Ser: 1.36 mg/dL — ABNORMAL HIGH (ref 0.76–1.27)
Globulin, Total: 2.8 g/dL (ref 1.5–4.5)
Glucose: 128 mg/dL — ABNORMAL HIGH (ref 70–99)
Potassium: 3.9 mmol/L (ref 3.5–5.2)
Sodium: 142 mmol/L (ref 134–144)
Total Protein: 7.2 g/dL (ref 6.0–8.5)
eGFR: 57 mL/min/{1.73_m2} — ABNORMAL LOW (ref >59)

## 2023-07-14 LAB — TSH: TSH: 1.02 u[IU]/mL (ref 0.450–4.500)

## 2023-07-14 LAB — T4, FREE: Free T4: 1.74 ng/dL (ref 0.82–1.77)

## 2023-07-14 LAB — BRAIN NATRIURETIC PEPTIDE: BNP: 279.5 pg/mL — ABNORMAL HIGH (ref 0.0–100.0)

## 2023-07-14 LAB — MAGNESIUM: Magnesium: 2.1 mg/dL (ref 1.6–2.3)

## 2023-07-21 ENCOUNTER — Telehealth: Payer: Self-pay

## 2023-07-21 NOTE — Telephone Encounter (Signed)
ONO reviewed by Dr. Jayme Cloud- Low SpO2 81%. Disruptions on tracing but patient qualifies for nocturnal O2 at 2Lpm.  Lm x1 for the patient.

## 2023-07-22 NOTE — Telephone Encounter (Signed)
Patient is aware. [employer] has delivered the oxygen to him. He will start using it at 2L at bedtime.  Nothing further needed.

## 2023-08-04 ENCOUNTER — Encounter: Payer: Self-pay | Admitting: Pulmonary Disease

## 2023-08-12 ENCOUNTER — Telehealth: Payer: Self-pay | Admitting: Cardiology

## 2023-08-12 NOTE — Telephone Encounter (Signed)
 Pt confirmed appt on 08/15/23

## 2023-08-15 ENCOUNTER — Encounter: Payer: Self-pay | Admitting: Cardiology

## 2023-08-15 ENCOUNTER — Ambulatory Visit: Payer: Medicaid Other | Attending: Cardiology | Admitting: Cardiology

## 2023-08-15 ENCOUNTER — Other Ambulatory Visit: Payer: Self-pay

## 2023-08-15 VITALS — BP 172/100 | HR 75 | Wt 211.2 lb

## 2023-08-15 DIAGNOSIS — I5022 Chronic systolic (congestive) heart failure: Secondary | ICD-10-CM

## 2023-08-15 MED ORDER — SACUBITRIL-VALSARTAN 24-26 MG PO TABS
1.0000 | ORAL_TABLET | Freq: Two times a day (BID) | ORAL | 11 refills | Status: DC
  Filled 2023-08-15: qty 60, 30d supply, fill #0

## 2023-08-15 NOTE — Progress Notes (Signed)
 ADVANCED HF CLINIC NOTE  Referring Physician: Lorine Bears, MD Primary Care: Patient, No Pcp Per Primary Cardiologist: Lorine Bears, MD   HPI:  [name]. is a 68 y.o. male with CAD, DM2, PVCs, former smoker, previous CVA, HTN, OSA and chronic systolic HF referred by Dr. Kirke Corin for further evaluation of his HF.  He has known history of CAD s/p  remote MI with previous LAD,diagonal and RCA stenting at [facility]. Cath 2016 showed patent LAD and diagonal stents and chronically occluded RCA and left to right collaterals.  He also has chronic systolic heart failure due to ischemic cardiomyopathy EF 25-30%  Cath 2/21 with 3v CAD with patent stents in the LAD and diagonal without significant restenosis, chronically occluded RCA stents with right to right and left-to-right collaterals and significant stenosis in the distal left circumflex supplying a relatively small OM 3 distribution.  Ejection fraction was 25 to 30%.  Right heart catheterization showed normal filling pressures, mild pulmonary hypertension and normal cardiac output.  Medical therapy was recommended.  Had NSTEMI 4/21 Cath showed occluded distal left circumflex which was treated successfully with PCI and drug-eluting stent placement.     He was admitted 6/23 with chest pain and heart failure.  Echo EF 30-35%.  Cath showed patent LAD and LCx stents with chronically occluded RCA with L-to-R collaterals.  Medical therapy recommended. He did not fill his prescriptions.  He returned to the ED after he was stung by multiple yellow jackets and was found to be in VT at 175 bpm.  He was cardioverted with 120 J.  He went back into VT and was treated successfully with IV amiodarone.     Admitted 7/23 with VT in the setting of not taking amiodarone as prescribed.  He ultimately underwent ICD placement by Dr. Lalla Brothers in October.  I saw him for the first time in 12/23. He was not taking Entresto regularly because it dragged him down. Stopped  entresto and added eplerenone. He stopped eplerenone because it made him feel anxious and he couldn't sleep. I started losartan as well as sertraline for anxiety  Sleep study 1/24 with mild OSA (AHI 9)   Echo 02/03/23 EF 30-35% mod MR RV ok. Personally reviewed  Had cut back on lasix and was not taking regularly. Seen on 02/25/23 in Pulmonary office with 6-7 pound weight gain and increased SOB. CXR showed pulmonary edema. BNP elevated, Lasix restarted at 80 daily (was supposed to be on 40 daily but only taking a couple times/week). Also treated with prednisone and inhalers for possible COPD flare  Patient not too interested in changing his medications; reports getting into an argument with prior cardiologist. From a symptomatic standpoint reports doing fairly well, however, has stopped all his medications and does not wish to be on meds anymore. We discussed this extensively today. We reviewed the importance of CHF meds.    Past Medical History:  Diagnosis Date   Anginal pain (HCC)    Arthritis    CAD (coronary artery disease)    a. s/p MI with LAD and Diag stenting @ [name];  b. 06/2015 Cath: LAD 77m/d ISR, 100 RCA (ISR) w/ L->R collats, otw mod nonobs dzs-->Med Rx; c. 08/2019 Cath: LM nl, LAD 10p/m ISR, D1 20, D2 100, RI min irregs, LCX 45m/d, OM1 100, OM2 50, RCA 100p, 70d. RPDA fills via collats from LAD. EF 25-35%-->Med Rx; d. 10/2019 NSTEMI/Cath: LCX now 100 (2.75x15 Resolute Onyx DES), otw stable compared to 08/2019.  Chronic combined systolic and diastolic CHF (congestive heart failure) (HCC)    a. 06/2015 Echo: EF 20-25%, Gr3 DD; b. 10/2019 Echo: EF 25-30%, glob HK, sev inf/infapical HK. Mod dil LA.   Dyspnea    Essential hypertension    Headache 12/23/2021   Hyperlipidemia    Hypokalemia    a. 06/2015 in setting of diuresis.   Ischemic cardiomyopathy    a. 2011 EF 45% ([name]);  b. 06/2015 Echo: EF 20-25%; c. 04/2019 Echo: EF 40-45%; d. 08/2019 LV gram: EF 25-35%; e. 10/2019 Echo: EF 25-30%.    PVC's (premature ventricular contractions)    a. 09/2019 Zio (3 days): Avg hr 70, 4 runs NSVT, 5 runs SVT, rare PACs, frequent PVCs w/ 11.8% burden.   Sleep apnea    Stroke [facility])    Stroke/Right temporal lobe infarction [facility])    a. 10/2019 MRI brain: 1cm acute ischemic nonhemorrhagic R temporal lobe infarct. Age-related cerebral atrophy w/ moderate chronic small vessel ischemic dzs.   Type 2 diabetes mellitus with hyperglycemia (HCC) 10/08/2019    Current Outpatient Medications  Medication Sig Dispense Refill   albuterol (VENTOLIN HFA) 108 (90 Base) MCG/ACT inhaler Inhale 2 puffs into the lungs every 6 (six) hours as needed for wheezing or shortness of breath. (Patient not taking: Reported on 08/15/2023) 54 g 2   amiodarone (PACERONE) 200 MG tablet Take 1 tablet (200 mg total) by mouth daily. (Patient not taking: Reported on 08/15/2023) 90 tablet 1   aspirin EC 81 MG tablet Take 1 tablet (81 mg total) by mouth daily. Swallow whole. (Patient not taking: Reported on 07/13/2023) 90 tablet 3   carvedilol (COREG) 6.25 MG tablet Take 1 tablet (6.25 mg total) by mouth 2 (two) times daily with a meal. (Patient not taking: Reported on 08/15/2023) 60 tablet 3   ezetimibe (ZETIA) 10 MG tablet Take 1 tablet (10 mg total) by mouth daily. (Patient not taking: Reported on 06/08/2023) 90 tablet 3   furosemide (LASIX) 40 MG tablet Take 1 tablet (40 mg total) by mouth daily. (Patient not taking: Reported on 08/15/2023) 90 tablet 3   gabapentin (NEURONTIN) 100 MG capsule Take 200 mg by mouth at bedtime. (Patient not taking: Reported on 06/08/2023)     gabapentin (NEURONTIN) 300 MG capsule Take 1 capsule (300 mg total) by mouth at bedtime. (Patient not taking: Reported on 06/08/2023) 90 capsule 1   gabapentin (NEURONTIN) 300 MG capsule Take 1 capsule (300 mg total) by mouth 2 (two) times daily. (Patient not taking: Reported on 06/08/2023) 180 capsule 1   losartan (COZAAR) 50 MG tablet Take 1 tablet (50 mg total) by mouth  daily. (Patient not taking: Reported on 06/08/2023) 90 tablet 3   methocarbamol (ROBAXIN) 500 MG tablet Take 1 tablet (500 mg total) by mouth 3 (three) times daily as needed. (Patient not taking: Reported on 06/08/2023) 90 tablet 0   nitroGLYCERIN (NITROSTAT) 0.4 MG SL tablet Place 1 tablet (0.4 mg total) under the tongue every 5 (five) minutes as needed for chest pain. (Patient not taking: Reported on 08/15/2023) 90 tablet 3   phenylephrine (NEO-SYNEPHRINE) 0.5 % nasal solution Place 1 drop into both nostrils at bedtime. (Patient not taking: Reported on 08/15/2023)     potassium chloride SA (KLOR-CON M) 20 MEQ tablet Take 1 tablet (20 mEq total) by mouth daily. (Patient not taking: Reported on 06/08/2023) 30 tablet 3   sacubitril-valsartan (ENTRESTO) 24-26 MG Take 1 tablet by mouth 2 (two) times daily. (Patient not taking: Reported on 08/15/2023)  60 tablet 11   sertraline (ZOLOFT) 50 MG tablet Take 1 tablet (50 mg total) by mouth daily. (Patient not taking: Reported on 06/08/2023) 90 tablet 0   sodium chloride (OCEAN) 0.65 % SOLN nasal spray Place 1 spray into both nostrils as needed for congestion. (Patient not taking: Reported on 08/15/2023)     spironolactone (ALDACTONE) 25 MG tablet Take 0.5 tablets (12.5 mg total) by mouth daily. (Patient not taking: Reported on 06/08/2023) 90 tablet 3   Tiotropium Bromide-Olodaterol (STIOLTO RESPIMAT) 2.5-2.5 MCG/ACT AERS Inhale 2 puffs into the lungs daily. (Patient not taking: Reported on 08/15/2023) 1 each    Tiotropium Bromide-Olodaterol (STIOLTO RESPIMAT) 2.5-2.5 MCG/ACT AERS Inhale 2 puffs into the lungs daily. (Patient not taking: Reported on 08/15/2023) 4 g 11   No current facility-administered medications for this visit.    Allergies  Allergen Reactions   Contrast Media [Iodinated Contrast Media] Shortness Of Breath   Iohexol Shortness Of Breath     Onset Date: [id number]    Jardiance [Empagliflozin] Other (See Comments)    Fatigue/weakness   Atorvastatin  Other (See Comments)    Myalgias    Hydrocodone Itching   Morphine And Codeine Other (See Comments)    Lost control    Penicillins Other (See Comments)    Unknown reaction Did it involve swelling of the face/tongue/throat, SOB, or low BP? Unknown Did it involve sudden or severe rash/hives, skin peeling, or any reaction on the inside of your mouth or nose? Unknown Did you need to seek medical attention at a hospital or doctor's office? Yes When did it last happen?      Childhood allergy  If all above answers are "NO", may proceed with cephalosporin use.    Rosuvastatin Other (See Comments)    Myalgias       Social History   Socioeconomic History   Marital status: Widowed    Spouse name: Not on file   Number of children: 2   Years of education: Not on file   Highest education level: GED or equivalent  Occupational History   Occupation: Statistician   Tobacco Use   Smoking status: Former    Current packs/day: 0.00    Types: Cigarettes    Quit date: 06/10/2011    Years since quitting: 12.1   Smokeless tobacco: Never  Substance and Sexual Activity   Alcohol use: No    Alcohol/week: 0.0 standard drinks of alcohol   Drug use: No   Sexual activity: Not Currently  Other Topics Concern   Not on file  Social History Narrative   Lives alone.   Social Drivers of Corporate investment banker Strain: Low Risk  (05/11/2022)   Received from [facility], [facility]   Overall Financial Resource Strain (CARDIA)    Difficulty of Paying Living Expenses: Not very hard  Food Insecurity: No Food Insecurity (05/11/2022)   Received from [facility], [facility]   Hunger Vital Sign    Worried About Running Out of Food in the Last Year: Never true    Ran Out of Food in the Last Year: Never true  Transportation Needs: No Transportation Needs (05/11/2022)   Received from [facility], [facility]   [facility] - Transportation    Lack of Transportation (Medical): No     Lack of Transportation (Non-Medical): No  Physical Activity: Inactive (04/30/2019)   Exercise Vital Sign    Days of Exercise per Week: 0 days  Minutes of Exercise per Session: 0 min  Stress: No Stress Concern Present (04/30/2019)   [employer] of Occupational Health - Occupational Stress Questionnaire    Feeling of Stress : Not at all  Social Connections: Moderately Isolated (04/30/2019)   Social Connection and Isolation Panel [NHANES]    Frequency of Communication with Friends and Family: More than three times a week    Frequency of Social Gatherings with Friends and Family: More than three times a week    Attends Religious Services: Never    Database administrator or Organizations: Yes    Attends Banker Meetings: Never    Marital Status: Divorced  Catering manager Violence: Not At Risk (04/30/2019)   Humiliation, Afraid, Rape, and Kick questionnaire    Fear of Current or Ex-Partner: No    Emotionally Abused: No    Physically Abused: No    Sexually Abused: No      Family History  Problem Relation Age of Onset   Coronary artery disease Mother    Diabetes Mother    Coronary artery disease Father     Vitals:   08/15/23 1345  BP: (!) 172/100  Pulse: 75  SpO2: 94%  Weight: 211 lb 4 oz (95.8 kg)   Wt Readings from Last 3 Encounters:  08/15/23 211 lb 4 oz (95.8 kg)  07/13/23 210 lb 2 oz (95.3 kg)  06/08/23 211 lb 3.2 oz (95.8 kg)    PHYSICAL EXAM: Vitals:   08/15/23 1345  BP: (!) 172/100  Pulse: 75  SpO2: 94%   GENERAL: NAD Lungs- CTA CARDIAC:  JVP: 7 cm          Normal rate with regular rhythm. No murmur.  Pulses 2+. No edema.  ABDOMEN: Soft, non-tender, non-distended.  EXTREMITIES: Warm and well perfused.  NEUROLOGIC: No obvious FND    ASSESSMENT & PLAN:  1.  Chronic systolic heart failure due to iCM - Echo 6/23 EF 30-35%.   - Echo 02/03/23 EF 30-35% mod MR RV ok. Personally reviewed - s/p MDT ICD - Reports NYHA II symptoms after  stopping all his medications.  - Volume status ok despite being off lasix (suspect that won't last) - Currently off all HF meds including: carvedilol to 6.25 bid, losartan 50 bid, spiro 25 daily  - Unable to tolerate eplerenone due to anxiety. Unable to tolerate Entresto due to fatigue - He did not tolerate Jardiance in the past due to fatigue and weakness.   - He previously stopped all medications and voiced frustration with his previous providers and does not believe the medications are helping. We discussed this extensively with him today. He is agreeable to restart Entresto. Will start Entresto 24/26mg  BID and repeat labs in 7-10 days.   2. CAD  - s/p remote MI. S/p PCI LAD, LCx and RCA - Cath 6/23: patent LAD and left circumflex stents with chronically occluded right coronary artery with left-to-right collaterals.  Medical therapy was recommended. - No s/s angina - Continue DAPT. He doesn't tolerate statin - Follows with Dr. Kirke Corin   3  Essential hypertension:  - BP up after being off all meds. Not willing to restart despite my discussion with him   4. H/o ventricular tachycardia: - Status post ICD placement.   - he has stopped amio   5. H/o PVCs - Zio 3/21: Avg hr 70, 4 runs NSVT, 5 runs SVT, rare PACs, frequent PVCs w/ 11.8% burden.   6. Mild OSA - Sleep study 1/24  with mild OSA (AHI 9)   7. Anxiety - Symptoms improved with  Zoloft 50 but he stopped it - Follows with PCP  I spent 30 minutes caring for this patient today including face to face time, ordering and reviewing labs, extensively discussing medication compliance, seeing the patient, documenting in the record, and arranging follow ups.   Tiwanda Threats, DO  1:58 PM

## 2023-08-15 NOTE — Patient Instructions (Signed)
 Medication Changes:  Restart Entresto  24/26 MG. Prescription sent to [facility]   Special Instructions // Education:  Do the following things EVERYDAY: Weigh yourself in the morning before breakfast. Write it down and keep it in a log. Take your medicines as prescribed Eat low salt foods--Limit salt (sodium) to 2000 mg per day.  Stay as active as you can everyday Limit all fluids for the day to less than 2 liters   Follow-Up in: 3 months with Dr. Bruce Caper. Please call us  in March to schedule this appointment.    If you have any questions or concerns before your next appointment please send us  a message through [place] or call our office at [phone] Monday-Friday 8 am-5 pm.   If you have an urgent need after hours on the weekend please call your Primary Cardiologist or the [facility] in [place] at [phone].   At the [facility], you and your health needs are our priority. We have a designated team specialized in the treatment of Heart Failure. This Care Team includes your primary Heart Failure Specialized Cardiologist (physician), Advanced Practice Providers (APPs- Physician Assistants and Nurse Practitioners), and Pharmacist who all work together to provide you with the care you need, when you need it.   You may see any of the following providers on your designated Care Team at your next follow up:  Dr. Jules Oar Dr. Peder Bourdon Dr. Alwin Baars Dr. Judyth Nunnery Nieves Bars, NP Ruddy Corral, [place] [address] [place], [place] Dennise Fitz, NP [place] Lee, NP Shawnee Dellen, NP Bevely Brush, PharmD

## 2023-09-06 ENCOUNTER — Ambulatory Visit: Payer: Medicaid Other

## 2023-09-06 DIAGNOSIS — I5022 Chronic systolic (congestive) heart failure: Secondary | ICD-10-CM | POA: Diagnosis not present

## 2023-09-06 DIAGNOSIS — I255 Ischemic cardiomyopathy: Secondary | ICD-10-CM

## 2023-09-08 LAB — CUP PACEART REMOTE DEVICE CHECK
Battery Remaining Longevity: 101 mo
Battery Voltage: 3.01 V
Brady Statistic AP VP Percent: 0.06 %
Brady Statistic AP VS Percent: 30.27 %
Brady Statistic AS VP Percent: 0.03 %
Brady Statistic AS VS Percent: 69.65 %
Brady Statistic RA Percent Paced: 29.42 %
Brady Statistic RV Percent Paced: 0.09 %
Date Time Interrogation Session: 20250304012402
HighPow Impedance: 64 Ohm
Implantable Lead Connection Status: 753985
Implantable Lead Connection Status: 753985
Implantable Lead Implant Date: 20231004
Implantable Lead Implant Date: 20231004
Implantable Lead Location: 753859
Implantable Lead Location: 753860
Implantable Lead Model: 5076
Implantable Pulse Generator Implant Date: 20231004
Lead Channel Impedance Value: 342 Ohm
Lead Channel Impedance Value: 399 Ohm
Lead Channel Impedance Value: 437 Ohm
Lead Channel Pacing Threshold Amplitude: 0.5 V
Lead Channel Pacing Threshold Amplitude: 0.875 V
Lead Channel Pacing Threshold Pulse Width: 0.4 ms
Lead Channel Pacing Threshold Pulse Width: 0.4 ms
Lead Channel Sensing Intrinsic Amplitude: 2.25 mV
Lead Channel Sensing Intrinsic Amplitude: 2.25 mV
Lead Channel Sensing Intrinsic Amplitude: 26.125 mV
Lead Channel Sensing Intrinsic Amplitude: 26.125 mV
Lead Channel Setting Pacing Amplitude: 1.5 V
Lead Channel Setting Pacing Amplitude: 1.5 V
Lead Channel Setting Pacing Pulse Width: 0.4 ms
Lead Channel Setting Sensing Sensitivity: 0.3 mV
Zone Setting Status: 755011

## 2023-09-10 IMAGING — CT CT HEAD W/O CM
4 series · 15 of 47 positions shown, 17 images · non-contrast
Comparison: Brain MRI 1 day prior, CT head 06/17/2021

CLINICAL DATA: Dizziness

EXAM:
CT HEAD WITHOUT CONTRAST
TECHNIQUE: Contiguous axial images were obtained from the base of the skull
through the vertex without intravenous contrast.

[Series 2: head wo · axial · 0.47mm/px · z∈[-225,-105]mm · 7 of 33 slices shown, 9 images]
[im 5/33  brain]
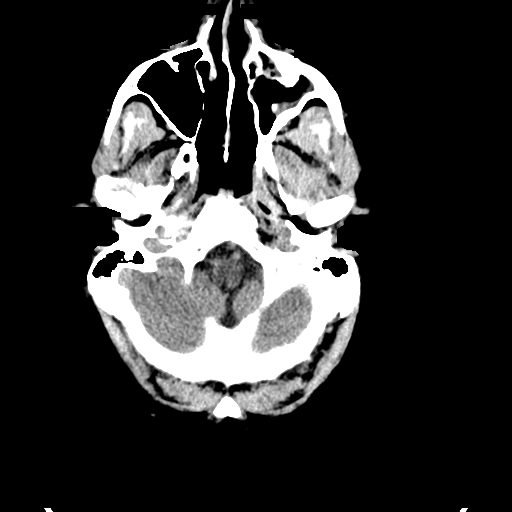
[im 5/33  bone]
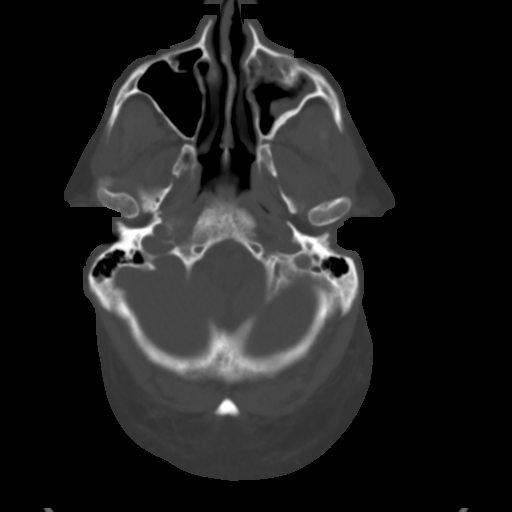
[im 9/33  brain]
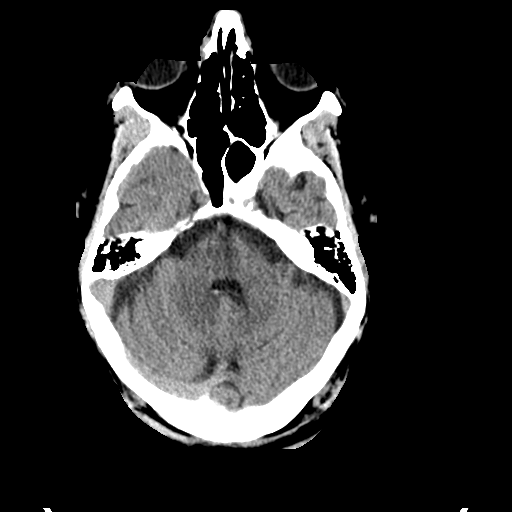
[im 13/33  brain]
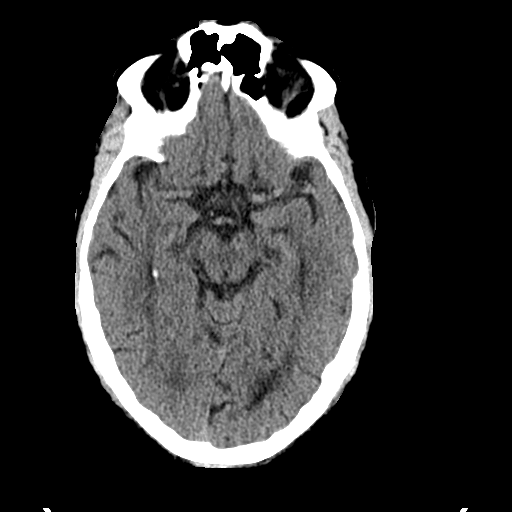
[im 17/33  brain]
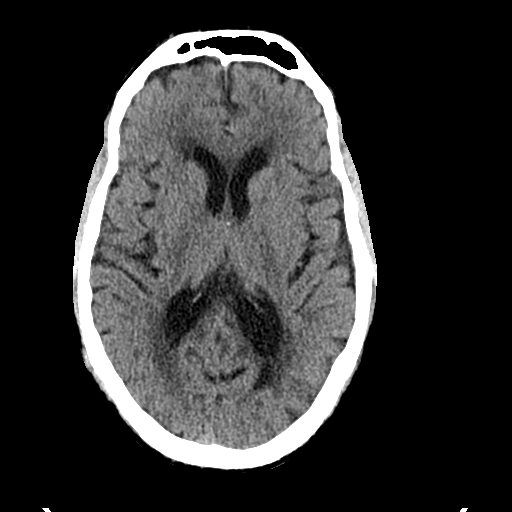
[im 21/33  brain]
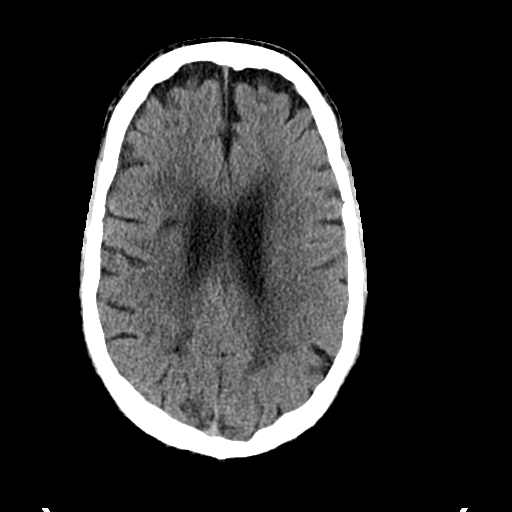
[im 21/33  bone]
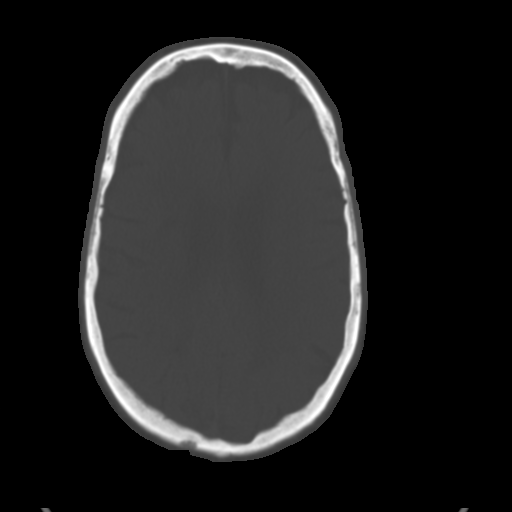
[im 25/33  brain]
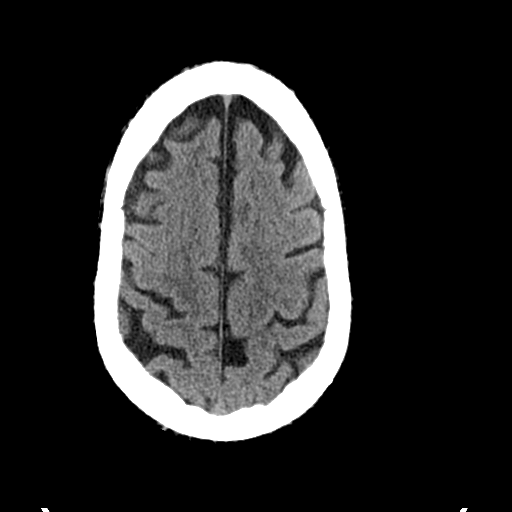
[im 29/33  brain]
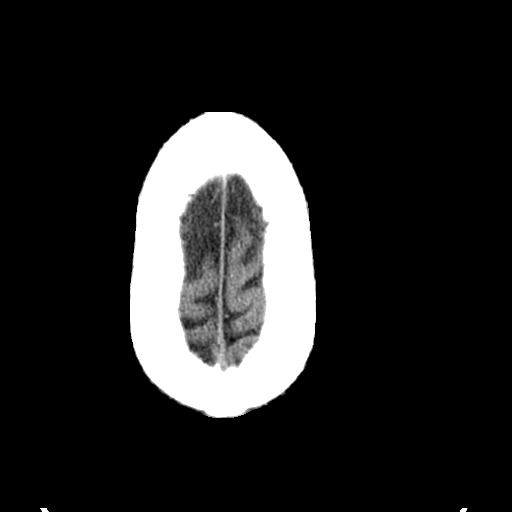

[Series 3: head bone · axial · 0.47mm/px · z∈[-229,-213]mm · 2 of 83 slices shown]
[im 9/83  bone]
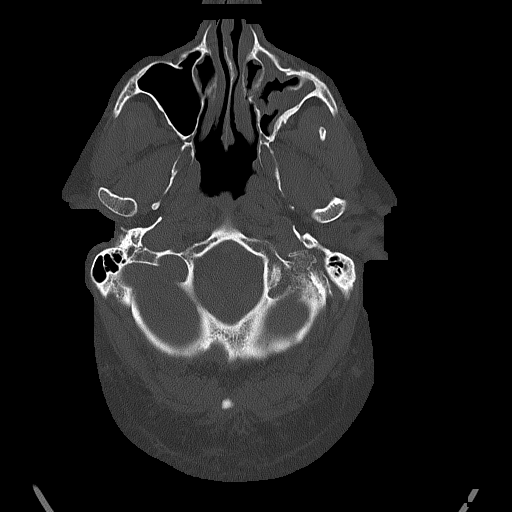
[im 17/83  bone]
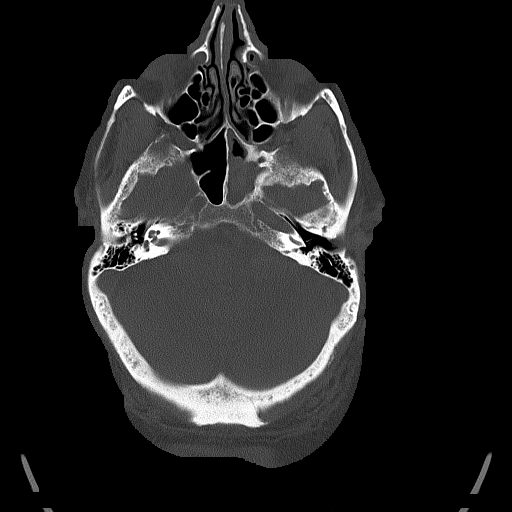

[Series 4: coronal soft tissue · coronal · 0.29mm/px · 3 of 79 slices shown]
[im 27/79  brain]
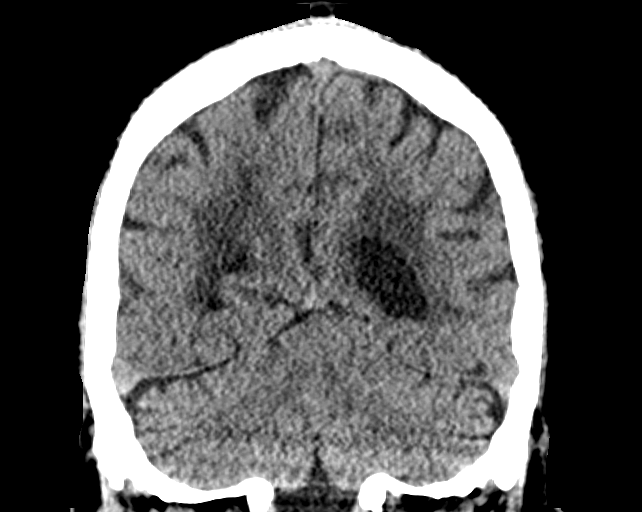
[im 35/79  brain]
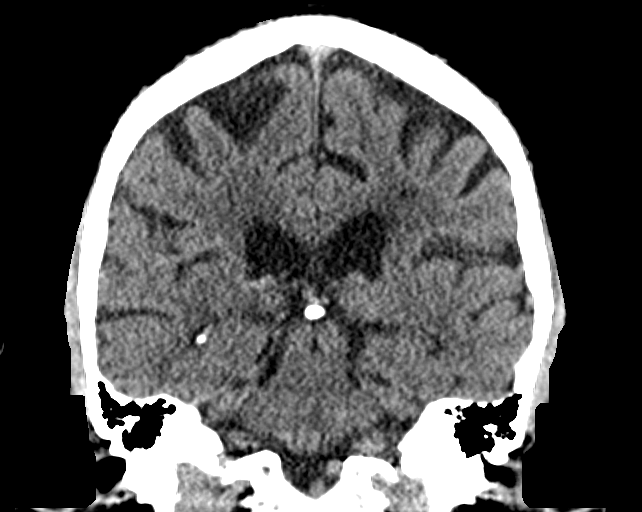
[im 44/79  brain]
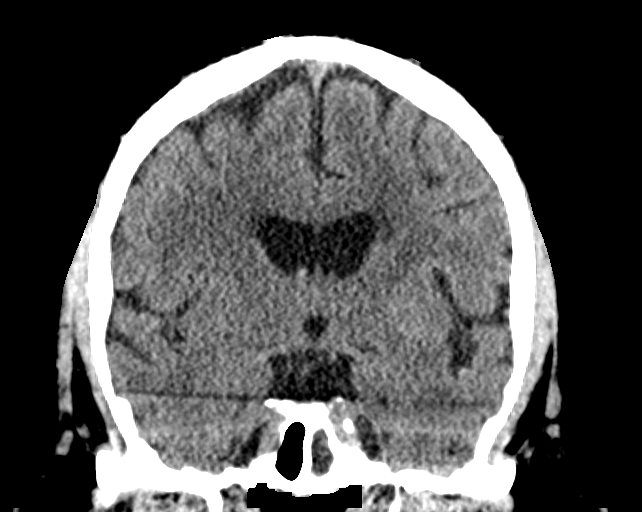

[Series 5: sagittal soft tissue · sagittal · 0.29mm/px · 3 of 63 slices shown]
[im 21/63  brain]
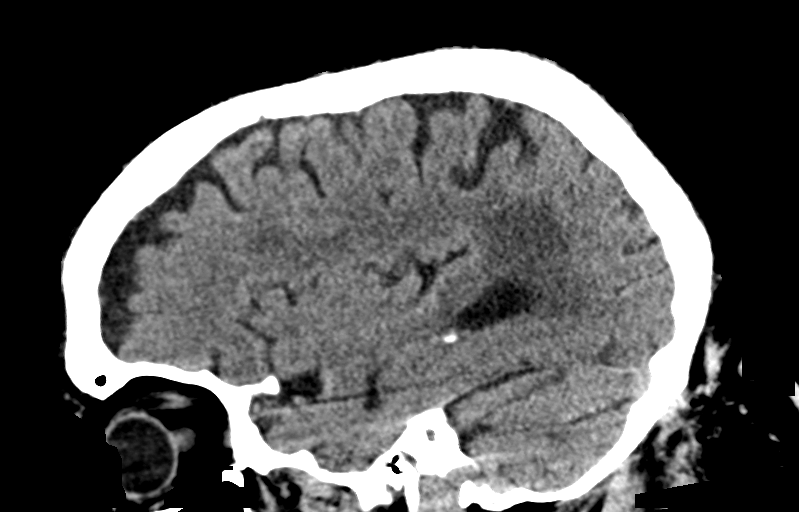
[im 32/63  brain]
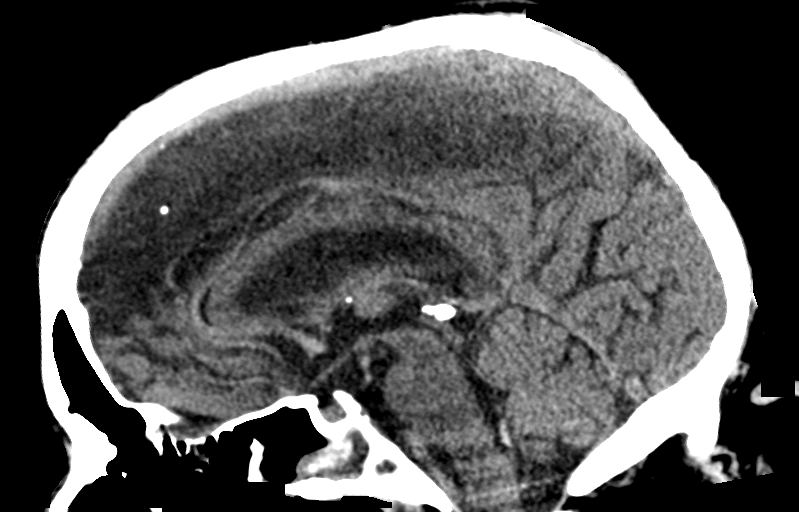
[im 42/63  brain]
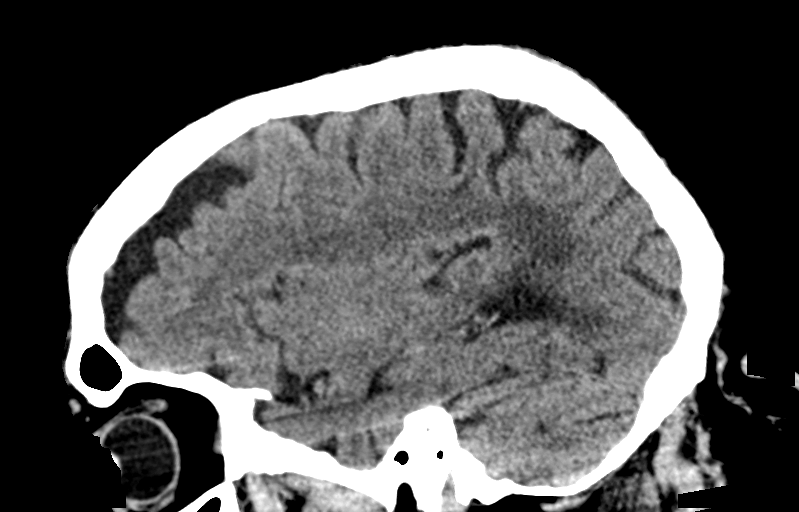

[15 of 47 positions shown; findings below may reference images not displayed]

FINDINGS: Brain: There is no acute intracranial hemorrhage, extra-axial fluid
collection, or acute infarct.

Parenchymal volume is within normal limits. The ventricles are
stable in size. Multiple small infarcts are seen in the bilateral
cerebral hemisphere white matter. Confluent hypodensity in the
subcortical and periventricular white matter likely reflects sequela
of moderate chronic white matter microangiopathy.

There is no solid mass lesion.  There is no midline shift.

Vascular: No hyperdense vessel or unexpected calcification.

Skull: Normal. Negative for fracture or focal lesion.

Sinuses/Orbits: Moderate mucosal thickening is seen in the left
maxillary and sphenoid sinuses with associated hyperostosis,
unchanged. A right lens implant is in place. The globes and orbits
are otherwise unremarkable.

Other: None.
IMPRESSION: Stable appearance of the brain with no acute intracranial pathology.

## 2023-09-19 ENCOUNTER — Other Ambulatory Visit: Payer: Self-pay

## 2023-09-27 ENCOUNTER — Emergency Department

## 2023-09-27 ENCOUNTER — Ambulatory Visit: Admitting: Pulmonary Disease

## 2023-09-27 ENCOUNTER — Inpatient Hospital Stay
Admission: EM | Admit: 2023-09-27 | Discharge: 2023-09-30 | DRG: 291 | Disposition: A | Discharge status: Home / Self Care | Attending: Student in an Organized Health Care Education/Training Program | Admitting: Student in an Organized Health Care Education/Training Program

## 2023-09-27 ENCOUNTER — Encounter: Payer: Self-pay | Admitting: Pulmonary Disease

## 2023-09-27 ENCOUNTER — Encounter: Payer: Self-pay | Admitting: Emergency Medicine

## 2023-09-27 ENCOUNTER — Other Ambulatory Visit: Payer: Self-pay

## 2023-09-27 VITALS — BP 146/96 | HR 72 | Temp 97.8°F | Ht 66.0 in | Wt 212.0 lb

## 2023-09-27 DIAGNOSIS — Z885 Allergy status to narcotic agent status: Secondary | ICD-10-CM

## 2023-09-27 DIAGNOSIS — F419 Anxiety disorder, unspecified: Secondary | ICD-10-CM | POA: Diagnosis present

## 2023-09-27 DIAGNOSIS — J441 Chronic obstructive pulmonary disease with (acute) exacerbation: Secondary | ICD-10-CM | POA: Diagnosis present

## 2023-09-27 DIAGNOSIS — Z1152 Encounter for screening for COVID-19: Secondary | ICD-10-CM

## 2023-09-27 DIAGNOSIS — I25110 Atherosclerotic heart disease of native coronary artery with unstable angina pectoris: Secondary | ICD-10-CM | POA: Diagnosis present

## 2023-09-27 DIAGNOSIS — E785 Hyperlipidemia, unspecified: Secondary | ICD-10-CM | POA: Diagnosis present

## 2023-09-27 DIAGNOSIS — J9601 Acute respiratory failure with hypoxia: Secondary | ICD-10-CM | POA: Diagnosis present

## 2023-09-27 DIAGNOSIS — Z91041 Radiographic dye allergy status: Secondary | ICD-10-CM | POA: Diagnosis not present

## 2023-09-27 DIAGNOSIS — I5022 Chronic systolic (congestive) heart failure: Secondary | ICD-10-CM | POA: Diagnosis not present

## 2023-09-27 DIAGNOSIS — I509 Heart failure, unspecified: Secondary | ICD-10-CM | POA: Diagnosis present

## 2023-09-27 DIAGNOSIS — Z833 Family history of diabetes mellitus: Secondary | ICD-10-CM | POA: Diagnosis not present

## 2023-09-27 DIAGNOSIS — Z7951 Long term (current) use of inhaled steroids: Secondary | ICD-10-CM

## 2023-09-27 DIAGNOSIS — I160 Hypertensive urgency: Secondary | ICD-10-CM | POA: Diagnosis present

## 2023-09-27 DIAGNOSIS — Z8249 Family history of ischemic heart disease and other diseases of the circulatory system: Secondary | ICD-10-CM

## 2023-09-27 DIAGNOSIS — I110 Hypertensive heart disease with heart failure: Principal | ICD-10-CM | POA: Diagnosis present

## 2023-09-27 DIAGNOSIS — R0602 Shortness of breath: Secondary | ICD-10-CM | POA: Diagnosis not present

## 2023-09-27 DIAGNOSIS — I5023 Acute on chronic systolic (congestive) heart failure: Secondary | ICD-10-CM | POA: Diagnosis present

## 2023-09-27 DIAGNOSIS — E1165 Type 2 diabetes mellitus with hyperglycemia: Secondary | ICD-10-CM | POA: Diagnosis present

## 2023-09-27 DIAGNOSIS — I472 Ventricular tachycardia, unspecified: Secondary | ICD-10-CM | POA: Diagnosis present

## 2023-09-27 DIAGNOSIS — Z95810 Presence of automatic (implantable) cardiac defibrillator: Secondary | ICD-10-CM

## 2023-09-27 DIAGNOSIS — Z7982 Long term (current) use of aspirin: Secondary | ICD-10-CM | POA: Diagnosis not present

## 2023-09-27 DIAGNOSIS — I2510 Atherosclerotic heart disease of native coronary artery without angina pectoris: Secondary | ICD-10-CM | POA: Diagnosis present

## 2023-09-27 DIAGNOSIS — Z6834 Body mass index (BMI) 34.0-34.9, adult: Secondary | ICD-10-CM

## 2023-09-27 DIAGNOSIS — Z87891 Personal history of nicotine dependence: Secondary | ICD-10-CM | POA: Diagnosis not present

## 2023-09-27 DIAGNOSIS — I252 Old myocardial infarction: Secondary | ICD-10-CM

## 2023-09-27 DIAGNOSIS — Z91148 Patient's other noncompliance with medication regimen for other reason: Secondary | ICD-10-CM

## 2023-09-27 DIAGNOSIS — E669 Obesity, unspecified: Secondary | ICD-10-CM | POA: Diagnosis present

## 2023-09-27 DIAGNOSIS — E861 Hypovolemia: Secondary | ICD-10-CM | POA: Diagnosis present

## 2023-09-27 DIAGNOSIS — Z79899 Other long term (current) drug therapy: Secondary | ICD-10-CM

## 2023-09-27 DIAGNOSIS — Z880 Allergy status to penicillin: Secondary | ICD-10-CM

## 2023-09-27 DIAGNOSIS — J449 Chronic obstructive pulmonary disease, unspecified: Secondary | ICD-10-CM | POA: Diagnosis not present

## 2023-09-27 DIAGNOSIS — I493 Ventricular premature depolarization: Secondary | ICD-10-CM | POA: Diagnosis present

## 2023-09-27 DIAGNOSIS — H8110 Benign paroxysmal vertigo, unspecified ear: Secondary | ICD-10-CM | POA: Diagnosis present

## 2023-09-27 DIAGNOSIS — G4733 Obstructive sleep apnea (adult) (pediatric): Secondary | ICD-10-CM | POA: Diagnosis present

## 2023-09-27 DIAGNOSIS — Z955 Presence of coronary angioplasty implant and graft: Secondary | ICD-10-CM

## 2023-09-27 DIAGNOSIS — I272 Pulmonary hypertension, unspecified: Secondary | ICD-10-CM | POA: Diagnosis present

## 2023-09-27 DIAGNOSIS — Z888 Allergy status to other drugs, medicaments and biological substances status: Secondary | ICD-10-CM

## 2023-09-27 DIAGNOSIS — I255 Ischemic cardiomyopathy: Secondary | ICD-10-CM | POA: Diagnosis present

## 2023-09-27 DIAGNOSIS — Z8673 Personal history of transient ischemic attack (TIA), and cerebral infarction without residual deficits: Secondary | ICD-10-CM

## 2023-09-27 LAB — CBC
HCT: 46.2 % (ref 39.0–52.0)
Hemoglobin: 15.5 g/dL (ref 13.0–17.0)
MCH: 29.1 pg (ref 26.0–34.0)
MCHC: 33.5 g/dL (ref 30.0–36.0)
MCV: 86.8 fL (ref 80.0–100.0)
Platelets: 201 10*3/uL (ref 150–400)
RBC: 5.32 MIL/uL (ref 4.22–5.81)
RDW: 16.1 % — ABNORMAL HIGH (ref 11.5–15.5)
WBC: 12.1 10*3/uL — ABNORMAL HIGH (ref 4.0–10.5)
nRBC: 0 % (ref 0.0–0.2)

## 2023-09-27 LAB — RESPIRATORY PANEL BY PCR

## 2023-09-27 LAB — TROPONIN I (HIGH SENSITIVITY): Troponin I (High Sensitivity): 18 ng/L — ABNORMAL HIGH (ref <18)

## 2023-09-27 LAB — BASIC METABOLIC PANEL
Anion gap: 11 (ref 5–15)
BUN: 11 mg/dL (ref 8–23)
CO2: 20 mmol/L — ABNORMAL LOW (ref 22–32)
Calcium: 9.1 mg/dL (ref 8.9–10.3)
Chloride: 108 mmol/L (ref 98–111)
Creatinine, Ser: 1 mg/dL (ref 0.61–1.24)
GFR, Estimated: >60 mL/min (ref >60)
Glucose, Bld: 148 mg/dL — ABNORMAL HIGH (ref 70–99)
Potassium: 3.5 mmol/L (ref 3.5–5.1)
Sodium: 139 mmol/L (ref 135–145)

## 2023-09-27 LAB — RESP PANEL BY RT-PCR (RSV, FLU A&B, COVID)  RVPGX2
Influenza A by PCR: NEGATIVE
Influenza B by PCR: NEGATIVE
Resp Syncytial Virus by PCR: NEGATIVE
SARS Coronavirus 2 by RT PCR: NEGATIVE

## 2023-09-27 LAB — HEMOGLOBIN A1C
Hgb A1c MFr Bld: 6.4 % — ABNORMAL HIGH (ref 4.8–5.6)
Mean Plasma Glucose: 136.98 mg/dL

## 2023-09-27 LAB — CBG MONITORING, ED: Glucose-Capillary: 106 mg/dL — ABNORMAL HIGH (ref 70–99)

## 2023-09-27 LAB — POC COVID19 BINAXNOW: SARS Coronavirus 2 Ag: NEGATIVE

## 2023-09-27 LAB — BRAIN NATRIURETIC PEPTIDE: B Natriuretic Peptide: 603.3 pg/mL — ABNORMAL HIGH (ref 0.0–100.0)

## 2023-09-27 MED ORDER — ENOXAPARIN SODIUM 60 MG/0.6ML IJ SOSY
45.0000 mg | PREFILLED_SYRINGE | INTRAMUSCULAR | Status: DC
  Administered 2023-09-27 – 2023-09-29 (×3): 45 mg via SUBCUTANEOUS
  Filled 2023-09-27 (×3): qty 0.6

## 2023-09-27 MED ORDER — ASPIRIN 81 MG PO TBEC
81.0000 mg | DELAYED_RELEASE_TABLET | Freq: Every day | ORAL | Status: DC
  Administered 2023-09-27 – 2023-09-30 (×4): 81 mg via ORAL
  Filled 2023-09-27 (×4): qty 1

## 2023-09-27 MED ORDER — ONDANSETRON HCL 4 MG/2ML IJ SOLN: 4.0000 mg | Freq: Four times a day (QID) | INTRAMUSCULAR | Status: DC | PRN

## 2023-09-27 MED ORDER — FUROSEMIDE 10 MG/ML IJ SOLN: 40.0000 mg | Freq: Two times a day (BID) | INTRAMUSCULAR | Status: DC

## 2023-09-27 MED ORDER — FUROSEMIDE 10 MG/ML IJ SOLN
40.0000 mg | Freq: Once | INTRAMUSCULAR | Status: AC
  Administered 2023-09-27: 40 mg via INTRAVENOUS
  Filled 2023-09-27: qty 4

## 2023-09-27 MED ORDER — PREDNISONE 20 MG PO TABS
40.0000 mg | ORAL_TABLET | Freq: Every day | ORAL | Status: DC
  Administered 2023-09-28 – 2023-09-30 (×3): 40 mg via ORAL
  Filled 2023-09-27 (×3): qty 2

## 2023-09-27 MED ORDER — SODIUM CHLORIDE 0.9% FLUSH
3.0000 mL | Freq: Two times a day (BID) | INTRAVENOUS | Status: DC
  Administered 2023-09-27 – 2023-09-30 (×7): 3 mL via INTRAVENOUS

## 2023-09-27 MED ORDER — BUDESONIDE 0.25 MG/2ML IN SUSP
0.2500 mg | Freq: Two times a day (BID) | RESPIRATORY_TRACT | Status: DC
  Administered 2023-09-27 – 2023-09-28 (×3): 0.25 mg via RESPIRATORY_TRACT
  Filled 2023-09-27 (×4): qty 2

## 2023-09-27 MED ORDER — EZETIMIBE 10 MG PO TABS
10.0000 mg | ORAL_TABLET | Freq: Every day | ORAL | Status: DC
  Administered 2023-09-27 – 2023-09-30 (×4): 10 mg via ORAL
  Filled 2023-09-27 (×4): qty 1

## 2023-09-27 MED ORDER — INSULIN ASPART 100 UNIT/ML IJ SOLN
0.0000 [IU] | Freq: Three times a day (TID) | INTRAMUSCULAR | Status: DC
  Administered 2023-09-28: 15 [IU] via SUBCUTANEOUS
  Administered 2023-09-28: 2 [IU] via SUBCUTANEOUS
  Administered 2023-09-28: 5 [IU] via SUBCUTANEOUS
  Administered 2023-09-29: 8 [IU] via SUBCUTANEOUS
  Administered 2023-09-29: 3 [IU] via SUBCUTANEOUS
  Administered 2023-09-29: 8 [IU] via SUBCUTANEOUS
  Filled 2023-09-27 (×6): qty 1

## 2023-09-27 MED ORDER — CARVEDILOL 3.125 MG PO TABS
3.1250 mg | ORAL_TABLET | Freq: Two times a day (BID) | ORAL | Status: DC
  Administered 2023-09-28 – 2023-09-30 (×5): 3.125 mg via ORAL
  Filled 2023-09-27 (×5): qty 1

## 2023-09-27 MED ORDER — IPRATROPIUM-ALBUTEROL 0.5-2.5 (3) MG/3ML IN SOLN
RESPIRATORY_TRACT | Status: AC
  Filled 2023-09-27: qty 3

## 2023-09-27 MED ORDER — SODIUM CHLORIDE 0.9 % IV SOLN: 250.0000 mL | INTRAVENOUS | Status: AC | PRN

## 2023-09-27 MED ORDER — ACETAMINOPHEN 325 MG PO TABS
650.0000 mg | ORAL_TABLET | ORAL | Status: DC | PRN
  Administered 2023-09-29: 650 mg via ORAL
  Filled 2023-09-27: qty 2

## 2023-09-27 MED ORDER — SACUBITRIL-VALSARTAN 24-26 MG PO TABS
1.0000 | ORAL_TABLET | Freq: Two times a day (BID) | ORAL | Status: DC
  Administered 2023-09-27 – 2023-09-30 (×6): 1 via ORAL
  Filled 2023-09-27 (×6): qty 1

## 2023-09-27 MED ORDER — SODIUM CHLORIDE 0.9% FLUSH: 3.0000 mL | INTRAVENOUS | Status: DC | PRN

## 2023-09-27 MED ORDER — FUROSEMIDE 10 MG/ML IJ SOLN
80.0000 mg | Freq: Two times a day (BID) | INTRAMUSCULAR | Status: DC
  Administered 2023-09-27 – 2023-09-29 (×4): 80 mg via INTRAVENOUS
  Filled 2023-09-27 (×4): qty 8

## 2023-09-27 MED ORDER — IPRATROPIUM-ALBUTEROL 0.5-2.5 (3) MG/3ML IN SOLN
3.0000 mL | Freq: Four times a day (QID) | RESPIRATORY_TRACT | Status: DC
  Administered 2023-09-27 – 2023-09-28 (×6): 3 mL via RESPIRATORY_TRACT
  Filled 2023-09-27 (×3): qty 3

## 2023-09-27 NOTE — ED Notes (Signed)
 Report given to [facility]

## 2023-09-27 NOTE — Assessment & Plan Note (Signed)
 Last A1c of 9.2%.  - A1c pending - SSI, moderate

## 2023-09-27 NOTE — Patient Instructions (Signed)
 VISIT SUMMARY:  Today, you were seen for significant shortness of breath that worsened last night. You also experienced cold chills and a cough with cream-colored phlegm. You ran out of your Stiolto inhaler two days ago and have not used it since. A COVID test was negative. Given your symptoms and history, you have been referred to the emergency room for further evaluation.  YOUR PLAN:  -SHORTNESS OF BREATH: Shortness of breath can be a sign of various conditions, including COPD exacerbation or worsening heart failure. Since your symptoms worsened significantly last night and did not improve with your albuterol inhaler, you have been referred to the emergency room for further evaluation. You will receive a nebulizer treatment with Duoneb and a chest x-ray at the hospital.  -COPD EXACERBATION: COPD exacerbation means a worsening of your chronic obstructive pulmonary disease symptoms. This can be triggered by not using your Stiolto inhaler for two days. Your shortness of breath and cough suggest this might be happening.  -CONGESTIVE HEART FAILURE WITH REDUCED EJECTION FRACTION: Congestive heart failure with reduced ejection fraction means your heart is not pumping blood as well as it should. Your current symptoms of shortness of breath and crackles in your lungs may indicate that your heart failure is worsening.  INSTRUCTIONS:  Please go to the emergency room immediately for further evaluation. You will receive a nebulizer treatment with Duoneb and a chest x-ray at the hospital.

## 2023-09-27 NOTE — ED Provider Notes (Signed)
 [facility] Provider Note   Event Date/Time   First MD Initiated Contact with Patient 09/27/23 1004     (approximate)  History   Shortness of Breath  HPI  [name]. is a 68 y.o. male history of unstable angina COPD, CHF  Patient reports that he has been short of breath for several days.  It seems to be getting worse last night.  No fevers or chills.  He has had slight productive cough of a sort of light yellow sputum.  He gets quite short of breath if he lays down but reports that is been ongoing issue for a while.  No wheezing.  No fevers or chills.  No pain anywhere.  Just feels "short of breath".  He has not noticed any major leg swelling he will take Lasix from time to time if his leg swell up feels like they might be a little swollen today as he reflects on   Discussed patient's case with Dr. Jayme Cloud who saw him briefly this morning.  She advises that she is concerned about potential volume overload, he had a negative COVID test in the clinic.  He has a history of CHF and she noted Rales in the bases but no wheezing.  He does have a history of COPD as well as congestive heart failure.  He is not have any history of thromboembolism     Physical Exam   Triage Vital Signs: ED Triage Vitals  Encounter Vitals Group     BP 09/27/23 0954 (!) 163/116     Systolic BP Percentile --      Diastolic BP Percentile --      Pulse Rate 09/27/23 0954 73     Resp 09/27/23 0954 17     Temp 09/27/23 0954 97.8 F (36.6 C)     Temp Source 09/27/23 0954 Oral     SpO2 09/27/23 0954 91 %     Weight 09/27/23 0950 212 lb (96.2 kg)     Height 09/27/23 0950 5\' 6"  (1.676 m)     Head Circumference --      Peak Flow --      Pain Score 09/27/23 0950 0     Pain Loc --      Pain Education --      Exclude from Growth Chart --     Most recent vital signs: Vitals:   09/27/23 1100 09/27/23 1425  BP: (!) 155/97 (!) 155/106  Pulse: 71 73  Resp: (!) 24 (!) 24  Temp:   98 F (36.7 C)  SpO2: 91% 92%     General: Awake, no distress.  Mild increased work of breathing but no acute distress.  Oxygen saturation 89% on room air mid 90s on 2 L CV:  Good peripheral perfusion.  No tachycardia normal tones Resp:  Normal effor except for very slight tachypnea, Rales in the bases bilaterally.  No recent wheezing.  Speaks in phrases Abd:  No distention.  Other:  Trace bilateral lower extreme edema   ED Results / Procedures / Treatments   Labs (all labs ordered are listed, but only abnormal results are displayed) Labs Reviewed  BASIC METABOLIC PANEL - Abnormal; Notable for the following components:      Result Value   CO2 20 (*)    Glucose, Bld 148 (*)    All other components within normal limits  CBC - Abnormal; Notable for the following components:   WBC 12.1 (*)  RDW 16.1 (*)    All other components within normal limits  BRAIN NATRIURETIC PEPTIDE - Abnormal; Notable for the following components:   B Natriuretic Peptide 603.3 (*)    All other components within normal limits  TROPONIN I (HIGH SENSITIVITY) - Abnormal; Notable for the following components:   Troponin I (High Sensitivity) 18 (*)    All other components within normal limits  RESP PANEL BY RT-PCR (RSV, FLU A&B, COVID)  [id number]     EKG  And interpreted by me at 10 AM heart rate 75 QRS 110 QTc 500 approximately.  There is mild nonspecific T wave abnormality and slight slurring of the T waves.  QT appears mildly prolonged.  Sinus rhythm with occasional PVC.  No frank ischemia but nonspecific T wave abnormality is present    RADIOLOGY  DG Chest 2 View Result Date: 09/27/2023 CLINICAL DATA:  Shortness of breath.  Bradycardia. EXAM: CHEST - 2 VIEW COMPARISON:  02/25/2023. FINDINGS: Mild central vascular congestion, similar to the prior study. No frank pulmonary edema. Bilateral lung fields are otherwise clear. No dense consolidation or lung collapse. Bilateral costophrenic angles are clear.  Stable cardio-mediastinal silhouette. There is a left sided 2-lead pacemaker. No acute osseous abnormalities. The soft tissues are within normal limits. IMPRESSION: *Mild central vascular congestion, similar to the prior study. No frank pulmonary edema. Electronically Signed   By: Jules Schick [name]   On: 09/27/2023 13:15   Chest x-ray interpreted by me as borderline congestion   PROCEDURES:  Critical Care performed: No  Procedures   MEDICATIONS ORDERED IN ED: Medications  furosemide (LASIX) injection 40 mg (40 mg Intravenous Given 09/27/23 1428)     IMPRESSION / MDM / ASSESSMENT AND PLAN / ED COURSE  I reviewed the triage vital signs and the nursing notes.                              Differential diagnosis includes, but is not limited to, CHF, COPD, pneumonia, ACS though this seems highly unlikely without associated chest pain and reassuring ECG, etc.  No unilateral leg edema no clinical history to suggest DVT.  No associated chest pain.  Patient does also report some symptoms quite positional worsen when he lays down at night slight sputum production.  Reports typically would take Lasix when he starts no swelling in his legs    Patient's presentation is most consistent with acute complicated illness / injury requiring diagnostic workup.     ----------------------------------------- 2:38 PM on 09/27/2023 ----------------------------------------- Consulted with and patient accepted to hospitalist by Dr. Huel Cote.  Patient understanding agreeable with plan.     FINAL CLINICAL IMPRESSION(S) / ED DIAGNOSES   Final diagnoses:  Acute on chronic congestive heart failure, unspecified heart failure type (HCC)     Rx / DC Orders   ED Discharge Orders     None        Note:  This document was prepared using Dragon voice recognition software and may include unintentional dictation errors.   Sharyn Creamer, MD 09/27/23 1438
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
## 2023-09-27 NOTE — Assessment & Plan Note (Signed)
 Patient endorsing increased cough and shortness of breath, predominantly due to CHF with may be an aspect of underlying COPD exacerbation.  Not much wheezing on examination.  - Start prednisone 40 mg daily - DuoNebs every 6 hours - Pulmicort twice daily - Full respiratory viral panel - Hold home bronchodilators

## 2023-09-27 NOTE — ED Notes (Signed)
 Pt O2 at 89%, verbal order for 2L  per MD Quale. Pt placed on O2 at this time and O2 now at 93. Pt had moment of not being able to breath. Pt requested to sit on edge of bed then stood up and stated he was dizzy. Pt HR dropped to 45s pt states he has had afib before. Pt HR now back at 65 and pt states he is better and was able to take in deep breath. MD informed.

## 2023-09-27 NOTE — Assessment & Plan Note (Signed)
 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  No prior AICD firing.  Patient states that he is still taking amiodarone, however last refill was in September 2024.  - Hold amiodarone for now pending cardiology evaluation

## 2023-09-27 NOTE — H&P (Signed)
 History and Physical    Patient: [name]. ZOX:[id number] DOB: April 01, 1956 DOA: 09/27/2023 DOS: the patient was seen and examined on 09/27/2023 PCP: Patient, No Pcp Per  Patient coming from: Home  Chief Complaint:  Chief Complaint  Patient presents with   Shortness of Breath   HPI: [name]. is a 68 y.o. male with medical history significant of chronic HFrEF with last EF of 30-35%, CAD, type 2 diabetes, COPD, CVA, hypertension, OSA, recurrent VT s/p ICD, who presents to the ED due to shortness of breath.  [name] states he has been experiencing gradually worsening shortness of breath, abdominal distention and lower extremity edema for the last few months but notes that his shortness of breath became very severe over the last 24 hours.  He endorses a worsening productive cough.  He endorses some chills over the last day and thinks his son may have recently been sick.  He denies any nausea, vomiting, abdominal pain, chest pain, palpitations.  He states that he has been taking Coreg, aspirin and amiodarone, however these medications have not been refilled recently.  ED course: On arrival to the ED, patient was hypertensive at 163/116 with heart rate of 78.  He was saturating at 91% on 2 L.  He was afebrile at 97.8.Initial workup notable for WBC of 12.1, glucose 148, BNP 603 with troponin of 18.  COVID-19, influenza and RSV PCR negative.  Chest x-ray with mild vascular congestion.  Patient started on IV Lasix and TRH contacted for admission.  Review of Systems: As mentioned in the history of present illness. All other systems reviewed and are negative.  Past Medical History:  Diagnosis Date   Anginal pain (HCC)    Arthritis    CAD (coronary artery disease)    a. s/p MI with LAD and Diag stenting @ [name];  b. 06/2015 Cath: LAD 58m/d ISR, 100 RCA (ISR) w/ L->R collats, otw mod nonobs dzs-->Med Rx; c. 08/2019 Cath: LM nl, LAD 10p/m ISR, D1 20, D2 100, RI min irregs, LCX 5m/d, OM1  100, OM2 50, RCA 100p, 70d. RPDA fills via collats from LAD. EF 25-35%-->Med Rx; d. 10/2019 NSTEMI/Cath: LCX now 100 (2.75x15 Resolute Onyx DES), otw stable compared to 08/2019.   Chronic combined systolic and diastolic CHF (congestive heart failure) (HCC)    a. 06/2015 Echo: EF 20-25%, Gr3 DD; b. 10/2019 Echo: EF 25-30%, glob HK, sev inf/infapical HK. Mod dil LA.   Dyspnea    Essential hypertension    Headache 12/23/2021   Hyperlipidemia    Hypokalemia    a. 06/2015 in setting of diuresis.   Ischemic cardiomyopathy    a. 2011 EF 45% ([name]);  b. 06/2015 Echo: EF 20-25%; c. 04/2019 Echo: EF 40-45%; d. 08/2019 LV gram: EF 25-35%; e. 10/2019 Echo: EF 25-30%.   PVC's (premature ventricular contractions)    a. 09/2019 Zio (3 days): Avg hr 70, 4 runs NSVT, 5 runs SVT, rare PACs, frequent PVCs w/ 11.8% burden.   Sleep apnea    Stroke [facility])    Stroke/Right temporal lobe infarction [facility])    a. 10/2019 MRI brain: 1cm acute ischemic nonhemorrhagic R temporal lobe infarct. Age-related cerebral atrophy w/ moderate chronic small vessel ischemic dzs.   Type 2 diabetes mellitus with hyperglycemia (HCC) 10/08/2019   Past Surgical History:  Procedure Laterality Date   BACK SURGERY  04/2019   CARDIAC CATHETERIZATION N/A 06/10/2015   Procedure: Left Heart Cath;  Surgeon: Iran Ouch, MD;  Location:  [facility] INVASIVE CV LAB;  Service: Cardiovascular;  Laterality: N/A;   CORONARY STENT INTERVENTION N/A 10/08/2019   Procedure: CORONARY STENT INTERVENTION;  Surgeon: Iran Ouch, MD;  Location: [facility] INVASIVE CV LAB;  Service: Cardiovascular;  Laterality: N/A;   CORONARY STENT PLACEMENT     ICD IMPLANT N/A 04/07/2022   Procedure: ICD IMPLANT;  Surgeon: Lanier Prude, MD;  Location: [facility] INVASIVE CV LAB;  Service: Cardiovascular;  Laterality: N/A;   LEFT HEART CATH AND CORONARY ANGIOGRAPHY N/A 10/08/2019   Procedure: LEFT HEART CATH AND CORONARY ANGIOGRAPHY poss pci;  Surgeon: Iran Ouch, MD;  Location:  [facility] INVASIVE CV LAB;  Service: Cardiovascular;  Laterality: N/A;   RIGHT HEART CATH AND CORONARY ANGIOGRAPHY N/A 12/30/2021   Procedure: RIGHT HEART CATH AND CORONARY ANGIOGRAPHY;  Surgeon: Yvonne Kendall, MD;  Location: [facility] INVASIVE CV LAB;  Service: Cardiovascular;  Laterality: N/A;   RIGHT/LEFT HEART CATH AND CORONARY ANGIOGRAPHY N/A 08/20/2019   Procedure: RIGHT/LEFT HEART CATH AND CORONARY ANGIOGRAPHY;  Surgeon: Iran Ouch, MD;  Location: [facility] INVASIVE CV LAB;  Service: Cardiovascular;  Laterality: N/A;   Social History:  reports that he quit smoking about 12 years ago. His smoking use included cigarettes. He has never used smokeless tobacco. He reports that he does not drink alcohol and does not use drugs.  Allergies  Allergen Reactions   Contrast Media [Iodinated Contrast Media] Shortness Of Breath   Iohexol Shortness Of Breath     Onset Date: [id number]    Jardiance [Empagliflozin] Other (See Comments)    Fatigue/weakness   Atorvastatin Other (See Comments)    Myalgias    Hydrocodone Itching   Morphine And Codeine Other (See Comments)    Lost control    Penicillins Other (See Comments)    Unknown reaction Did it involve swelling of the face/tongue/throat, SOB, or low BP? Unknown Did it involve sudden or severe rash/hives, skin peeling, or any reaction on the inside of your mouth or nose? Unknown Did you need to seek medical attention at a hospital or doctor's office? Yes When did it last happen?      Childhood allergy  If all above answers are "NO", may proceed with cephalosporin use.    Rosuvastatin Other (See Comments)    Myalgias     Family History  Problem Relation Age of Onset   Coronary artery disease Mother    Diabetes Mother    Coronary artery disease Father     Prior to Admission medications   Medication Sig Start Date End Date Taking? Authorizing Provider  albuterol (VENTOLIN HFA) 108 (90 Base) MCG/ACT inhaler Inhale 2 puffs into the lungs every 6  (six) hours as needed for wheezing or shortness of breath. 06/08/23   Salena Saner, MD  amiodarone (PACERONE) 200 MG tablet Take 1 tablet (200 mg total) by mouth daily. 03/30/23   Delma Freeze, FNP  aspirin EC 81 MG tablet Take 1 tablet (81 mg total) by mouth daily. Swallow whole. 01/15/22   Delma Freeze, FNP  carvedilol (COREG) 6.25 MG tablet Take 1 tablet (6.25 mg total) by mouth 2 (two) times daily with a meal. 03/30/23   Delma Freeze, FNP  ezetimibe (ZETIA) 10 MG tablet Take 1 tablet (10 mg total) by mouth daily. Patient not taking: Reported on 09/27/2023 06/01/22   Iran Ouch, MD  furosemide (LASIX) 40 MG tablet Take 1 tablet (40 mg total) by mouth daily. 03/30/23   Delma Freeze, FNP  losartan (  COZAAR) 50 MG tablet Take 1 tablet (50 mg total) by mouth daily. Patient not taking: Reported on 09/27/2023 11/30/22   Iran Ouch, MD  nitroGLYCERIN (NITROSTAT) 0.4 MG SL tablet Place 1 tablet (0.4 mg total) under the tongue every 5 (five) minutes as needed for chest pain. 01/15/22 09/27/23  Clarisa Kindred A, FNP  phenylephrine (NEO-SYNEPHRINE) 0.5 % nasal solution Place 1 drop into both nostrils at bedtime.    [provider]  potassium chloride SA (KLOR-CON M) 20 MEQ tablet Take 1 tablet (20 mEq total) by mouth daily. Patient not taking: Reported on 09/27/2023 09/16/22   Bensimhon, Bevelyn Buckles, MD  sacubitril-valsartan (ENTRESTO) 24-26 MG Take 1 tablet by mouth 2 (two) times daily. 08/15/23   Sabharwal, Aditya, DO  sertraline (ZOLOFT) 50 MG tablet Take 1 tablet (50 mg total) by mouth daily. Patient not taking: Reported on 09/27/2023 10/01/22   Bensimhon, Bevelyn Buckles, MD  sodium chloride (OCEAN) 0.65 % SOLN nasal spray Place 1 spray into both nostrils as needed for congestion.    [provider]  spironolactone (ALDACTONE) 25 MG tablet Take 0.5 tablets (12.5 mg total) by mouth daily. Patient not taking: Reported on 09/27/2023 03/30/23   Delma Freeze, FNP  Tiotropium  Bromide-Olodaterol (STIOLTO RESPIMAT) 2.5-2.5 MCG/ACT AERS Inhale 2 puffs into the lungs daily. Patient not taking: Reported on 08/15/2023 06/08/23   Salena Saner, MD    Physical Exam: Vitals:   09/27/23 0954 09/27/23 1100 09/27/23 1425 09/27/23 1500  BP: (!) 163/116 (!) 155/97 (!) 155/106 (!) 161/103  Pulse: 73 71 73 68  Resp: 17 (!) 24 (!) 24 (!) 31  Temp: 97.8 F (36.6 C)  98 F (36.7 C)   TempSrc: Oral  Oral   SpO2: 91% 91% 92% 91%  Weight:      Height:       Physical Exam Vitals and nursing note reviewed.  Constitutional:      General: He is not in acute distress.    Appearance: He is obese.  HENT:     Head: Normocephalic and atraumatic.     Mouth/Throat:     Mouth: Mucous membranes are moist.     Pharynx: Oropharynx is clear.  Eyes:     Extraocular Movements: Extraocular movements intact.     Pupils: Pupils are equal, round, and reactive to light.  Cardiovascular:     Rate and Rhythm: Normal rate and regular rhythm.  Pulmonary:     Effort: Pulmonary effort is normal. Tachypnea present. No respiratory distress.     Breath sounds: Decreased breath sounds (Notably decreased mid and lower lung fields) present. No wheezing, rhonchi or rales.  Abdominal:     General: There is distension.     Palpations: Abdomen is soft.     Tenderness: There is no abdominal tenderness. There is no guarding.  Musculoskeletal:     Right lower leg: 1+ Pitting Edema present.     Left lower leg: 1+ Pitting Edema present.  Skin:    General: Skin is warm and dry.  Neurological:     General: No focal deficit present.     Mental Status: He is alert and oriented to person, place, and time.  Psychiatric:        Mood and Affect: Mood normal.        Behavior: Behavior normal.    Data Reviewed: CBC with WBC of 12.1, hemoglobin of 15.5, MCV of 86, platelets of 201 BMP with sodium of 139, potassium 3.5, bicarb  20, glucose 148, BUN 11, creatinine 1, GFR above 60 BNP 603 Troponin  18 COVID-19, influenza and RSV PCR negative  EKG personally reviewed.  Sinus rhythm with rate of 75.  Multiple PVCs.  Nonspecific ST abnormalities that appear unchanged compared to prior.  DG Chest 2 View Result Date: 09/27/2023 CLINICAL DATA:  Shortness of breath.  Bradycardia. EXAM: CHEST - 2 VIEW COMPARISON:  02/25/2023. FINDINGS: Mild central vascular congestion, similar to the prior study. No frank pulmonary edema. Bilateral lung fields are otherwise clear. No dense consolidation or lung collapse. Bilateral costophrenic angles are clear. Stable cardio-mediastinal silhouette. There is a left sided 2-lead pacemaker. No acute osseous abnormalities. The soft tissues are within normal limits. IMPRESSION: *Mild central vascular congestion, similar to the prior study. No frank pulmonary edema. Electronically Signed   By: Jules Schick [name]   On: 09/27/2023 13:15   Results are pending, will review when available.  Assessment and Plan:  * Acute hypoxic respiratory failure (HCC) Patient is presenting with acute hypoxic respiratory failure, currently requiring 2 L to maintain oxygen saturation above 88%.  Suspected to be primarily due to acute HFrEF, however given recent sick contacts, there may be an aspect of viral etiology as well.  - Continue supplemental oxygen to maintain oxygen saturation above 88% - Wean as tolerated  Acute on chronic systolic CHF (congestive heart failure) (HCC) Per chart review, patient has an extensive history of HFrEF with reduced EF, follows with the advanced heart failure team.  Historically, he has been noncompliant with all of his medications.  Acute exacerbation is likely a combination of noncompliance and acute viral infection.  - Consult advanced heart failure team - Start Lasix 40 mg IV BID - Restart GDMT as tolerated and able  - Strict in and out - Daily weights  COPD (chronic obstructive pulmonary disease) (HCC) Patient endorsing increased cough and  shortness of breath, predominantly due to CHF with may be an aspect of underlying COPD exacerbation.  Not much wheezing on examination.  - Start prednisone 40 mg daily - DuoNebs every 6 hours - Pulmicort twice daily - Full respiratory viral panel - Hold home bronchodilators  Ventricular tachycardia (HCC) 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  No prior AICD firing.  Patient states that he is still taking amiodarone, however last refill was in September 2024.  - Hold amiodarone for now pending cardiology evaluation  Type 2 diabetes mellitus with hyperglycemia (HCC) Last A1c of 9.2%.  - A1c pending - SSI, moderate  CAD (coronary artery disease) Patient has a history of obstructive CAD s/p DES to left circumflex in 2021.  Patient is noncompliant with both aspirin and Zetia.  No chest pain reported at this time.  - Restart aspirin and Zetia - Lipid panel and A1c for risk stratification  Advance Care Planning:   Code Status: Full Code Confirmed.   Consults: Advanced heart failure  Family Communication: No family at bedside.   Severity of Illness: The appropriate patient status for this patient is INPATIENT. Inpatient status is judged to be reasonable and necessary in order to provide the required intensity of service to ensure the patient's safety. The patient's presenting symptoms, physical exam findings, and initial radiographic and laboratory data in the context of their chronic comorbidities is felt to place them at high risk for further clinical deterioration. Furthermore, it is not anticipated that the patient will be medically stable for discharge from the hospital within  2 midnights of admission.   * I certify that at the point of admission it is my clinical judgment that the patient will require inpatient hospital care spanning beyond 2 midnights from the point of admission due to high intensity of service,  high risk for further deterioration and high frequency of surveillance required.*  Author: Verdene Lennert, MD 09/27/2023 3:48 PM  For on call review [email or web address].

## 2023-09-27 NOTE — Assessment & Plan Note (Addendum)
 Per chart review, patient has an extensive history of HFrEF with reduced EF, follows with the advanced heart failure team.  Historically, he has been noncompliant with all of his medications.  Acute exacerbation is likely a combination of noncompliance and acute viral infection.  - Consult advanced heart failure team - Start Lasix 40 mg IV BID - Restart GDMT as tolerated and able  - Strict in and out - Daily weights

## 2023-09-27 NOTE — ED Notes (Signed)
 Pt taken to xray

## 2023-09-27 NOTE — Progress Notes (Signed)
 IV team consult noted for IV start.  Currently there are no orders for this procedure or IV medications.  Secure chat to [facility] RN to consult when orders are entered that include this procedure.  Samantha responded with "OK".

## 2023-09-27 NOTE — Assessment & Plan Note (Signed)
 Patient is presenting with acute hypoxic respiratory failure, currently requiring 2 L to maintain oxygen saturation above 88%.  Suspected to be primarily due to acute HFrEF, however given recent sick contacts, there may be an aspect of viral etiology as well.  - Continue supplemental oxygen to maintain oxygen saturation above 88% - Wean as tolerated

## 2023-09-27 NOTE — ED Notes (Signed)
 Unable to gave IV access. Pt states they normally have a hard time and call IV team. Consult placed at this time.

## 2023-09-27 NOTE — Consult Note (Signed)
 Advanced Heart Failure Team Consult Note  Primary Physician: Patient, No Pcp Per Cardiologist:  Lorine Bears, MD HF Cardiologist: Dr. Gasper Lloyd  Reason for Consultation: Acute on Chronic Systolic Heart Failure HPI:    [name]. is seen today for evaluation of acute on chronic systolic heart failure at the request of Dr. Huel Cote.   [name]. is a 68 y.o. male with CAD s/p DES to LAD, diagonal, and RCA at [facility], DM2, PVCs, former smoker, previous CVA, HTN, mild OSA, recurrent VT s/p ICD, T2DM and chronic systolic HF.   Cath in 2/21 showed 3v CAD with patent stents in the LAD and diagonal without significant restenosis, chronically occluded RCA stents with right to right and left-to-right collaterals and significant stenosis in the distal left circumflex supplying a relatively small OM 3 distribution. EF 25-30%. RHC showed mild pulmonary hypertension. Medical therapy was recommended.   NSTEMI in 4/21 Cath showed occluded distal left circumflex s/p successfully PCI/DES.     In 6/23 EF 30-35%. LHC with patent LAD and LCx stents + chronically occluded RCA with L-to-R collaterals. Medically managed. He did not fill his prescriptions.  Returned to ED with VT s/p DCCV, however converted back to VT which terminated with amiodarone.  Noncompliance with amio. Developed VT again, now s/p ICD by Dr. Lalla Brothers 10/23   Stopped taking Sherryll Burger because it slowed him down. Stopped taking Epleronone because it made him feel anxious and not sleep. Started on losartan and sertraline. Intermittently taking diuretics. Compliance remains an issue.  Echo 8/24 EF 30-35% mod MR RV ok  See in AHF Clinic 2/25. Wished to not take his meds anymore. Importance of HF meds extensively explained. From a symptom standpoint doing okay.  Sent to [facility] ED from Pulmonology Clinic with significant shortness of breath unresolved by albuterol. In addition reported that he has been having cough and cold symptoms  with chills overnight. Has been taking his Lasix from time to time but has noticed some BLE swelling and abdominal distention over the last month. On presentation hypertensive 160/110s with HR in 70s, on [place] with O2sats 91%. Lab notable for K 3.5, CO2 20, BNP 603, hs-trop 18, BUN/Cr 11/1.00. RVP negative. EKG SR 75 bpm with PVCs. CXR with mild vascular congestion.  In ED had pre-syncopal episode with bradycardia to the 40s upon standing that resolved with rest, HR quickly returned to 60-70s. Since he has been resumed on home nebs, prednisone, and Lasix 40 mg bid.   Home Medications Prior to Admission medications   Medication Sig Start Date End Date Taking? Authorizing Provider  albuterol (VENTOLIN HFA) 108 (90 Base) MCG/ACT inhaler Inhale 2 puffs into the lungs every 6 (six) hours as needed for wheezing or shortness of breath. 06/08/23  Yes Salena Saner, MD  amiodarone (PACERONE) 200 MG tablet Take 1 tablet (200 mg total) by mouth daily. 03/30/23  Yes Clarisa Kindred A, FNP  aspirin EC 81 MG tablet Take 1 tablet (81 mg total) by mouth daily. Swallow whole. 01/15/22  Yes Delma Freeze, FNP  carvedilol (COREG) 6.25 MG tablet Take 1 tablet (6.25 mg total) by mouth 2 (two) times daily with a meal. 03/30/23  Yes Clarisa Kindred A, FNP  ezetimibe (ZETIA) 10 MG tablet Take 1 tablet (10 mg total) by mouth daily. Patient not taking: Reported on 09/27/2023 06/01/22   Iran Ouch, MD  naproxen sodium (ALEVE) 220 MG tablet Take 220 mg by mouth daily as needed.  Yes [provider]  nitroGLYCERIN (NITROSTAT) 0.4 MG SL tablet Place 1 tablet (0.4 mg total) under the tongue every 5 (five) minutes as needed for chest pain. 01/15/22 09/27/23 Yes Clarisa Kindred A, FNP  sertraline (ZOLOFT) 50 MG tablet Take 1 tablet (50 mg total) by mouth daily. Patient not taking: Reported on 09/27/2023 10/01/22   Bensimhon, Bevelyn Buckles, MD  furosemide (LASIX) 40 MG tablet Take 1 tablet (40 mg total) by mouth daily. 03/30/23  Yes  Hackney, Inetta Fermo A, FNP  losartan (COZAAR) 50 MG tablet Take 1 tablet (50 mg total) by mouth daily. Patient not taking: Reported on 06/08/2023 11/30/22   Iran Ouch, MD  phenylephrine (NEO-SYNEPHRINE) 0.5 % nasal solution Place 1 drop into both nostrils at bedtime.   Yes [provider]  potassium chloride SA (KLOR-CON M) 20 MEQ tablet Take 1 tablet (20 mEq total) by mouth daily. Patient not taking: Reported on 06/08/2023 09/16/22   Bensimhon, Bevelyn Buckles, MD  sacubitril-valsartan (ENTRESTO) 24-26 MG Take 1 tablet by mouth 2 (two) times daily. Patient not taking: Reported on 09/27/2023 08/15/23   Sabharwal, Eliezer Lofts, DO  sodium chloride (OCEAN) 0.65 % SOLN nasal spray Place 1 spray into both nostrils as needed for congestion. Patient not taking: Reported on 09/27/2023    [provider]  spironolactone (ALDACTONE) 25 MG tablet Take 0.5 tablets (12.5 mg total) by mouth daily. Patient not taking: Reported on 06/08/2023 03/30/23   Delma Freeze, FNP  Tiotropium Bromide-Olodaterol (STIOLTO RESPIMAT) 2.5-2.5 MCG/ACT AERS Inhale 2 puffs into the lungs daily. 06/08/23  Yes Salena Saner, MD   Past Medical History: Past Medical History:  Diagnosis Date   Anginal pain (HCC)    Arthritis    CAD (coronary artery disease)    a. s/p MI with LAD and Diag stenting @ [name];  b. 06/2015 Cath: LAD 65m/d ISR, 100 RCA (ISR) w/ L->R collats, otw mod nonobs dzs-->Med Rx; c. 08/2019 Cath: LM nl, LAD 10p/m ISR, D1 20, D2 100, RI min irregs, LCX 29m/d, OM1 100, OM2 50, RCA 100p, 70d. RPDA fills via collats from LAD. EF 25-35%-->Med Rx; d. 10/2019 NSTEMI/Cath: LCX now 100 (2.75x15 Resolute Onyx DES), otw stable compared to 08/2019.   Chronic combined systolic and diastolic CHF (congestive heart failure) (HCC)    a. 06/2015 Echo: EF 20-25%, Gr3 DD; b. 10/2019 Echo: EF 25-30%, glob HK, sev inf/infapical HK. Mod dil LA.   Dyspnea    Essential hypertension    Headache 12/23/2021   Hyperlipidemia    Hypokalemia     a. 06/2015 in setting of diuresis.   Ischemic cardiomyopathy    a. 2011 EF 45% ([name]);  b. 06/2015 Echo: EF 20-25%; c. 04/2019 Echo: EF 40-45%; d. 08/2019 LV gram: EF 25-35%; e. 10/2019 Echo: EF 25-30%.   PVC's (premature ventricular contractions)    a. 09/2019 Zio (3 days): Avg hr 70, 4 runs NSVT, 5 runs SVT, rare PACs, frequent PVCs w/ 11.8% burden.   Sleep apnea    Stroke [facility])    Stroke/Right temporal lobe infarction [facility])    a. 10/2019 MRI brain: 1cm acute ischemic nonhemorrhagic R temporal lobe infarct. Age-related cerebral atrophy w/ moderate chronic small vessel ischemic dzs.   Type 2 diabetes mellitus with hyperglycemia (HCC) 10/08/2019   Past Surgical History: Past Surgical History:  Procedure Laterality Date   BACK SURGERY  04/2019   CARDIAC CATHETERIZATION N/A 06/10/2015   Procedure: Left Heart Cath;  Surgeon: Iran Ouch, MD;  Location: [facility]  INVASIVE CV LAB;  Service: Cardiovascular;  Laterality: N/A;   CORONARY STENT INTERVENTION N/A 10/08/2019   Procedure: CORONARY STENT INTERVENTION;  Surgeon: Iran Ouch, MD;  Location: [facility] INVASIVE CV LAB;  Service: Cardiovascular;  Laterality: N/A;   CORONARY STENT PLACEMENT     ICD IMPLANT N/A 04/07/2022   Procedure: ICD IMPLANT;  Surgeon: Lanier Prude, MD;  Location: [facility] INVASIVE CV LAB;  Service: Cardiovascular;  Laterality: N/A;   LEFT HEART CATH AND CORONARY ANGIOGRAPHY N/A 10/08/2019   Procedure: LEFT HEART CATH AND CORONARY ANGIOGRAPHY poss pci;  Surgeon: Iran Ouch, MD;  Location: [facility] INVASIVE CV LAB;  Service: Cardiovascular;  Laterality: N/A;   RIGHT HEART CATH AND CORONARY ANGIOGRAPHY N/A 12/30/2021   Procedure: RIGHT HEART CATH AND CORONARY ANGIOGRAPHY;  Surgeon: Yvonne Kendall, MD;  Location: [facility] INVASIVE CV LAB;  Service: Cardiovascular;  Laterality: N/A;   RIGHT/LEFT HEART CATH AND CORONARY ANGIOGRAPHY N/A 08/20/2019   Procedure: RIGHT/LEFT HEART CATH AND CORONARY ANGIOGRAPHY;  Surgeon: Iran Ouch, MD;  Location: [facility] INVASIVE CV LAB;  Service: Cardiovascular;  Laterality: N/A;   Family History: Family History  Problem Relation Age of Onset   Coronary artery disease Mother    Diabetes Mother    Coronary artery disease Father    Social History: Social History   Socioeconomic History   Marital status: Widowed    Spouse name: Not on file   Number of children: 2   Years of education: Not on file   Highest education level: GED or equivalent  Occupational History   Occupation: Statistician   Tobacco Use   Smoking status: Former    Current packs/day: 0.00    Types: Cigarettes    Quit date: 06/10/2011    Years since quitting: 12.3   Smokeless tobacco: Never  Substance and Sexual Activity   Alcohol use: No    Alcohol/week: 0.0 standard drinks of alcohol   Drug use: No   Sexual activity: Not Currently  Other Topics Concern   Not on file  Social History Narrative   Lives alone.   Social Drivers of Corporate investment banker Strain: Low Risk  (05/11/2022)   Received from [facility], [facility]   Overall Financial Resource Strain (CARDIA)    Difficulty of Paying Living Expenses: Not very hard  Food Insecurity: No Food Insecurity (05/11/2022)   Received from [facility], [facility]   Hunger Vital Sign    Worried About Running Out of Food in the Last Year: Never true    Ran Out of Food in the Last Year: Never true  Transportation Needs: No Transportation Needs (05/11/2022)   Received from [facility], [facility]   [facility] - Transportation    Lack of Transportation (Medical): No    Lack of Transportation (Non-Medical): No  Physical Activity: Inactive (04/30/2019)   Exercise Vital Sign    Days of Exercise per Week: 0 days    Minutes of Exercise per Session: 0 min  Stress: No Stress Concern Present (04/30/2019)   [employer] of Occupational Health - Occupational Stress Questionnaire    Feeling of Stress : Not at all  Social  Connections: Moderately Isolated (04/30/2019)   Social Connection and Isolation Panel [NHANES]    Frequency of Communication with Friends and Family: More than three times a week    Frequency of Social Gatherings with Friends and Family: More than three times a week    Attends Religious  Services: Never    Active Member of Clubs or Organizations: Yes    Attends Banker Meetings: Never    Marital Status: Divorced   Allergies:  Allergies  Allergen Reactions   Contrast Media [Iodinated Contrast Media] Shortness Of Breath   Iohexol Shortness Of Breath     Onset Date: [id number]    Jardiance [Empagliflozin] Other (See Comments)    Fatigue/weakness   Atorvastatin Other (See Comments)    Myalgias    Hydrocodone Itching   Morphine And Codeine Other (See Comments)    Lost control    Penicillins Other (See Comments)    Unknown reaction Did it involve swelling of the face/tongue/throat, SOB, or low BP? Unknown Did it involve sudden or severe rash/hives, skin peeling, or any reaction on the inside of your mouth or nose? Unknown Did you need to seek medical attention at a hospital or doctor's office? Yes When did it last happen?      Childhood allergy  If all above answers are "NO", may proceed with cephalosporin use.    Rosuvastatin Other (See Comments)    Myalgias    Objective:    Vital Signs:   Temp:  [97.8 F (36.6 C)-98 F (36.7 C)] 98 F (36.7 C) (03/25 1425) Pulse Rate:  [68-73] 68 (03/25 1500) Resp:  [17-31] 31 (03/25 1500) BP: (155-163)/(97-116) 161/103 (03/25 1500) SpO2:  [91 %-92 %] 91 % (03/25 1500) Weight:  [96.2 kg] 96.2 kg (03/25 0950)   Weight change: Filed Weights   09/27/23 0950  Weight: 96.2 kg   Intake/Output:  No intake or output data in the 24 hours ending 09/27/23 1523   Physical Exam    General: Well appearing. No distress on  Cardiac: S1 and S2 present. No murmurs or rub. Resp: Wheezing in bases Abdomen: Taut, non-tender,  distended.  Extremities: Warm and dry.  1+ edema.  Neuro: Alert and oriented x3. Affect pleasant. Moves all extremities without difficulty.  Telemetry   SR in 70-80s with occasional PVCs (personally reviewed)  EKG    ST 75 bpm with PVCs (personally reviewed)  Labs   Basic Metabolic Panel: Recent Labs  Lab 09/27/23 0956  NA 139  K 3.5  CL 108  CO2 20*  GLUCOSE 148*  BUN 11  CREATININE 1.00  CALCIUM 9.1   CBC: Recent Labs  Lab 09/27/23 0956  WBC 12.1*  HGB 15.5  HCT 46.2  MCV 86.8  PLT 201   Cardiac Enzymes: No results for input(s): "CKTOTAL", "CKMB", "CKMBINDEX", "TROPONINI" in the last 168 hours.  BNP: BNP (last 3 results) Recent Labs    03/30/23 1157 07/13/23 1520 09/27/23 0956  BNP 452.5* 279.5* 603.3*   ProBNP (last 3 results) No results for input(s): "PROBNP" in the last 8760 hours.  CBG: No results for input(s): "GLUCAP" in the last 168 hours.  Coagulation Studies: No results for input(s): "LABPROT", "INR" in the last 72 hours.  Imaging   DG Chest 2 View Result Date: 09/27/2023 CLINICAL DATA:  Shortness of breath.  Bradycardia. EXAM: CHEST - 2 VIEW COMPARISON:  02/25/2023. FINDINGS: Mild central vascular congestion, similar to the prior study. No frank pulmonary edema. Bilateral lung fields are otherwise clear. No dense consolidation or lung collapse. Bilateral costophrenic angles are clear. Stable cardio-mediastinal silhouette. There is a left sided 2-lead pacemaker. No acute osseous abnormalities. The soft tissues are within normal limits. IMPRESSION: *Mild central vascular congestion, similar to the prior study. No frank pulmonary edema. Electronically Signed   By:  Jules Schick [name]   On: 09/27/2023 13:15   Medications:    Current Medications:  aspirin EC  81 mg Oral Daily   budesonide (PULMICORT) nebulizer solution  0.25 mg Nebulization Q12H   enoxaparin (LOVENOX) injection  45 mg Subcutaneous Q24H   ezetimibe  10 mg Oral Daily    furosemide  40 mg Intravenous BID   insulin aspart  0-15 Units Subcutaneous TID WC   ipratropium-albuterol  3 mL Nebulization Q6H   [START ON 09/28/2023] predniSONE  40 mg Oral Q breakfast   sodium chloride flush  3 mL Intravenous Q12H    Infusions:  sodium chloride     Patient Profile   [name]. is a 68 y.o. male with CAD, DM2, PVCs, former smoker, previous CVA, HTN, OSA and chronic systolic HF.   Assessment/Plan   Acute on chronic systolic heart failure due to iCM - acute exacerbation in the setting on medication noncompliance (reported that he was off all HF medications at last AHF appointment 08/15/23 as he felt they were not working, extensively dicussed importance with him) and possible recent viral infection - Echo 8/24 EF 30-35% mod MR RV ok. - s/p MDT ICD - increase Lasix to 80 mg bid - resume Entresto 24/26 mg bid - Unable to tolerate eplerenone due to anxiety. Unable to tolerate Entresto due to fatigue - He did not tolerate Jardiance in the past due to fatigue and weakness.    Acute hypoxic respiratory failure - in the setting of acute CHF and volume overload and likely COPD exacerbation - prednisone and nebs per primary - diuresis as above  CAD  - s/p remote MI. S/p PCI LAD, LCx and RCA - Cath 6/23: patent LAD and left circumflex stents with chronically occluded right coronary artery with left-to-right collaterals.  Medical therapy was recommended. - No s/s angina - Continue ASA, has refused Plavix - Follows with Dr. Kirke Corin    HTN - BP up after being off all meds. Refused to start HTN med prior.   H/o ventricular tachycardia: - Status post ICD placement.   - he has stopped amio    PVCs - noted on EKG and tele   Mild OSA - Sleep study 1/24 with mild OSA (AHI 9)  - Does not wear CPAP   Anxiety - Symptoms improved with Zoloft 50 but he stopped it - Follows with PCP  Noncompliance remains a large issue.   Length of Stay: 0  [place] Nikoletta Varma, NP   09/27/2023, 3:23 PM  Advanced Heart Failure Team Pager [phone] (M-F; 7a - 5p)  Please contact [facility] for night-coverage after hours (4p -7a ) and weekends on [email or web address]

## 2023-09-27 NOTE — Assessment & Plan Note (Addendum)
 Patient has a history of obstructive CAD s/p DES to left circumflex in 2021.  Patient is noncompliant with both aspirin and Zetia.  No chest pain reported at this time.  - Restart aspirin and Zetia - Lipid panel and A1c for risk stratification

## 2023-09-27 NOTE — ED Triage Notes (Signed)
 Patient to ED via POV for SOB. Started last night. Sent to ED by pulmonologist. States his son has been sick. Hx CHF. NAD noted- speaking in full sentences without difficulty.  Did not receive report from pulmonologist RN that brought pt to ED.

## 2023-09-28 DIAGNOSIS — I509 Heart failure, unspecified: Secondary | ICD-10-CM

## 2023-09-28 DIAGNOSIS — J9601 Acute respiratory failure with hypoxia: Secondary | ICD-10-CM | POA: Diagnosis not present

## 2023-09-28 LAB — BASIC METABOLIC PANEL
Anion gap: 9 (ref 5–15)
BUN: 14 mg/dL (ref 8–23)
CO2: 23 mmol/L (ref 22–32)
Calcium: 8.7 mg/dL — ABNORMAL LOW (ref 8.9–10.3)
Chloride: 104 mmol/L (ref 98–111)
Creatinine, Ser: 1.21 mg/dL (ref 0.61–1.24)
GFR, Estimated: >60 mL/min (ref >60)
Glucose, Bld: 126 mg/dL — ABNORMAL HIGH (ref 70–99)
Potassium: 3.6 mmol/L (ref 3.5–5.1)
Sodium: 136 mmol/L (ref 135–145)

## 2023-09-28 LAB — LIPID PANEL
Cholesterol: 135 mg/dL (ref 0–200)
HDL: 33 mg/dL — ABNORMAL LOW (ref >40)
LDL Cholesterol: 79 mg/dL (ref 0–99)
Total CHOL/HDL Ratio: 4.1 ratio
Triglycerides: 117 mg/dL (ref <150)
VLDL: 23 mg/dL (ref 0–40)

## 2023-09-28 LAB — CBG MONITORING, ED
Glucose-Capillary: 141 mg/dL — ABNORMAL HIGH (ref 70–99)
Glucose-Capillary: 237 mg/dL — ABNORMAL HIGH (ref 70–99)

## 2023-09-28 LAB — CBC WITH DIFFERENTIAL/PLATELET
Abs Immature Granulocytes: 0.04 10*3/uL (ref 0.00–0.07)
Basophils Absolute: 0.1 10*3/uL (ref 0.0–0.1)
Basophils Relative: 1 %
Eosinophils Absolute: 0.3 10*3/uL (ref 0.0–0.5)
Eosinophils Relative: 2 %
HCT: 46.9 % (ref 39.0–52.0)
Hemoglobin: 15.7 g/dL (ref 13.0–17.0)
Immature Granulocytes: 0 %
Lymphocytes Relative: 12 %
Lymphs Abs: 1.5 10*3/uL (ref 0.7–4.0)
MCH: 29.4 pg (ref 26.0–34.0)
MCHC: 33.5 g/dL (ref 30.0–36.0)
MCV: 87.8 fL (ref 80.0–100.0)
Monocytes Absolute: 1.2 10*3/uL — ABNORMAL HIGH (ref 0.1–1.0)
Monocytes Relative: 9 %
Neutro Abs: 10 10*3/uL — ABNORMAL HIGH (ref 1.7–7.7)
Neutrophils Relative %: 76 %
Platelets: 198 10*3/uL (ref 150–400)
RBC: 5.34 MIL/uL (ref 4.22–5.81)
RDW: 15.9 % — ABNORMAL HIGH (ref 11.5–15.5)
WBC: 13.1 10*3/uL — ABNORMAL HIGH (ref 4.0–10.5)
nRBC: 0 % (ref 0.0–0.2)

## 2023-09-28 LAB — HIV ANTIBODY (ROUTINE TESTING W REFLEX): HIV Screen 4th Generation wRfx: NONREACTIVE

## 2023-09-28 LAB — MAGNESIUM: Magnesium: 2.2 mg/dL (ref 1.7–2.4)

## 2023-09-28 LAB — GLUCOSE, CAPILLARY
Glucose-Capillary: 384 mg/dL — ABNORMAL HIGH (ref 70–99)
Glucose-Capillary: 346 mg/dL — ABNORMAL HIGH (ref 70–99)

## 2023-09-28 MED ORDER — IPRATROPIUM-ALBUTEROL 0.5-2.5 (3) MG/3ML IN SOLN: 3.0000 mL | Freq: Four times a day (QID) | RESPIRATORY_TRACT | Status: DC | PRN

## 2023-09-28 MED ORDER — IPRATROPIUM-ALBUTEROL 0.5-2.5 (3) MG/3ML IN SOLN
RESPIRATORY_TRACT | Status: AC
  Filled 2023-09-28: qty 3

## 2023-09-28 MED ORDER — POTASSIUM CHLORIDE CRYS ER 20 MEQ PO TBCR
40.0000 meq | EXTENDED_RELEASE_TABLET | Freq: Once | ORAL | Status: AC
  Administered 2023-09-28: 40 meq via ORAL
  Filled 2023-09-28: qty 2

## 2023-09-28 NOTE — Consult Note (Signed)
 Advanced Heart Failure Team Consult Note  Primary Physician: Patient, No Pcp Per Cardiologist:  Lorine Bears, MD HF Cardiologist: Dr. Gasper Lloyd  Reason for Consultation: Acute on Chronic Systolic Heart Failure Interval hx:   - Dyspnea has improved; reports making 'alot' of urine although not recorded.  - Has had some lightheadedness.   Objective:    Vital Signs:   Temp:  [98 F (36.7 C)-98.4 F (36.9 C)] 98.2 F (36.8 C) (03/26 0823) Pulse Rate:  [65-76] 71 (03/26 0930) Resp:  [15-31] 24 (03/26 0930) BP: (110-161)/(76-106) 125/87 (03/26 0930) SpO2:  [91 %-98 %] 94 % (03/26 0930)   Weight change: Filed Weights   09/27/23 0950  Weight: 96.2 kg   Intake/Output:   Intake/Output Summary (Last 24 hours) at 09/28/2023 1204 Last data filed at 09/27/2023 1747 Gross per 24 hour  Intake --  Output 475 ml  Net -475 ml     Physical Exam    General: NAD Cardiac: S1 and S2 present. No murmurs or rub. Resp: decreased at bases Abdomen: Taut, non-tender, distended.  Extremities: Warm and dry.  Trace edema  Neuro: Alert and oriented x3. Affect pleasant. Moves all extremities without difficulty.  Telemetry   SR in 70-80s with occasional PVCs (personally reviewed)  EKG    ST 75 bpm with PVCs (personally reviewed)  Labs   Basic Metabolic Panel: Recent Labs  Lab 09/27/23 0956 09/28/23 0424  NA 139 136  K 3.5 3.6  CL 108 104  CO2 20* 23  GLUCOSE 148* 126*  BUN 11 14  CREATININE 1.00 1.21  CALCIUM 9.1 8.7*  MG  --  2.2   CBC: Recent Labs  Lab 09/27/23 0956 09/28/23 0424  WBC 12.1* 13.1*  NEUTROABS  --  10.0*  HGB 15.5 15.7  HCT 46.2 46.9  MCV 86.8 87.8  PLT 201 198   Cardiac Enzymes: No results for input(s): "CKTOTAL", "CKMB", "CKMBINDEX", "TROPONINI" in the last 168 hours.  BNP: BNP (last 3 results) Recent Labs    03/30/23 1157 07/13/23 1520 09/27/23 0956  BNP 452.5* 279.5* 603.3*   ProBNP (last 3 results) No results for input(s): "PROBNP"  in the last 8760 hours.  CBG: Recent Labs  Lab 09/27/23 1642 09/28/23 0820  GLUCAP 106* 141*    Coagulation Studies: No results for input(s): "LABPROT", "INR" in the last 72 hours.  Imaging   No results found.  Medications:    Current Medications:  aspirin EC  81 mg Oral Daily   budesonide (PULMICORT) nebulizer solution  0.25 mg Nebulization Q12H   carvedilol  3.125 mg Oral BID WC   enoxaparin (LOVENOX) injection  45 mg Subcutaneous Q24H   ezetimibe  10 mg Oral Daily   furosemide  80 mg Intravenous BID   insulin aspart  0-15 Units Subcutaneous TID WC   ipratropium-albuterol  3 mL Nebulization Q6H   predniSONE  40 mg Oral Q breakfast   sacubitril-valsartan  1 tablet Oral BID   sodium chloride flush  3 mL Intravenous Q12H    Infusions:  sodium chloride     Patient Profile   Glen Sookdeo. is a 68 y.o. male with CAD, DM2, PVCs, former smoker, previous CVA, HTN, OSA and chronic systolic HF.   Assessment/Plan   Acute on chronic systolic heart failure due to iCM - acute exacerbation in the setting on medication noncompliance (reported that he was off all HF medications at last AHF appointment 08/15/23 as he felt they were not working, extensively  dicussed importance with him) and possible recent viral infection - Echo 8/24 EF 30-35% mod MR RV ok. - s/p MDT ICD - continue Lasix to 80 mg bid; transition to torsemide tomorrow.  - continue Entresto 24/26 mg bid - Unable to tolerate eplerenone due to anxiety. Unable to tolerate Entresto due to fatigue - He did not tolerate Jardiance in the past due to fatigue and weakness.   - Likely discharge tomorrow.   Acute hypoxic respiratory failure - in the setting of acute CHF and volume overload and likely COPD exacerbation - prednisone and nebs per primary - diuresis as above - improved today  CAD  - s/p remote MI. S/p PCI LAD, LCx and RCA - Cath 6/23: patent LAD and left circumflex stents with chronically occluded right  coronary artery with left-to-right collaterals.  Medical therapy was recommended. - No s/s angina - Continue ASA, has refused Plavix - Follows with Dr. Kirke Corin    HTN - BP up after being off all meds. Refused to start HTN med prior.   H/o ventricular tachycardia: - Status post ICD placement.   - he has stopped amio    PVCs - noted on EKG and tele   Mild OSA - Sleep study 1/24 with mild OSA (AHI 9)  - Does not wear CPAP   Anxiety - Symptoms improved with Zoloft 50 but he stopped it - Follows with PCP  Noncompliance remains a large issue.   Length of Stay: 1  Glen Wuthrich, DO  09/28/2023, 12:04 PM  Advanced Heart Failure Team Pager [phone] (M-F; 7a - 5p)  Please contact [facility] for night-coverage after hours (4p -7a ) and weekends on [email or web address]

## 2023-09-28 NOTE — Progress Notes (Addendum)
 Primary nurse attempted to do assessment and vitals on patient. Patient refused and told primary nurse to get out of room. This nurse went in and attempted to do assessment on patient. Patient got upset and stated "damn it, I just want to rest." This nurse educated patient that we need to assess him and take his vitals. Patient stated " he is going to walk on out the door" This nurse educated patient on the importance of staying and being treated. Patient stated he was still going to leave. AMA paperwork printed for patient. Patient agreed to stay. But still refused assessment. Dr. Dareen Piano. Made aware, she will speak with patient.

## 2023-09-28 NOTE — Progress Notes (Signed)
 Entered room and introduced myself to complete admission navigator. Patient attempting to turn off or mute TV, continued to press the channel up button. I took the remote and pressed the mute button and handed the remote back to the patient. Patient states "next time you come in here and take something out of my hand..." then pointed at the door and said "leave."   Admission navigator not complete at this time.

## 2023-09-28 NOTE — Progress Notes (Signed)
 Heart Failure Navigator Progress Note  Assessed for Heart & Vascular TOC clinic readiness.  Patient does not meet criteria due to current Advanced Heart Failure Team patient of Dr.  Dorthula Nettles, MD.  Navigator will sign off at this time.   Roxy Horseman, RN, BSN [facility] Heart Failure Navigator Secure Chat Only

## 2023-09-28 NOTE — Progress Notes (Signed)
 PROGRESS NOTE  [name].    DOB: 16-Oct-1955, 68 y.o.  ZOX:[id number]    Code Status: Full Code   DOA: 09/27/2023   LOS: 1   Brief hospital course  [name]. is a 68 y.o. male with medical history significant of chronic HFrEF with last EF of 30-35%, CAD, type 2 diabetes, COPD, CVA, hypertension, OSA, recurrent VT s/p ICD, who presents to the ED due to shortness of breath.   [name] states he has been experiencing gradually worsening shortness of breath, abdominal distention and lower extremity edema for the last few months but notes that his shortness of breath became very severe over the last 24 hours.  He endorses a worsening productive cough.  He endorses some chills over the last day and thinks his son may have recently been sick.  He denies any nausea, vomiting, abdominal pain, chest pain, palpitations.   He states that he has been taking Coreg, aspirin and amiodarone, however these medications have not been refilled recently.   ED course:163/116 with heart rate of 78.  He was saturating at 91% on 2 L.  He was afebrile at 97.8.Initial workup notable for WBC of 12.1, glucose 148, BNP 603 with troponin of 18.  COVID-19, influenza and RSV PCR negative.  Chest x-ray with mild vascular congestion.  Patient started on IV Lasix and [facility] contacted for admission.  09/28/23 -improving to 89-90 at rest while on room air. Still dyspnea and unable to ambulate  Assessment & Plan  Principal Problem:   Acute hypoxic respiratory failure (HCC) Active Problems:   Acute on chronic systolic CHF (congestive heart failure) (HCC)   COPD (chronic obstructive pulmonary disease) (HCC)   CAD (coronary artery disease)   Type 2 diabetes mellitus with hyperglycemia (HCC)   Ventricular tachycardia (HCC)  Acute on chronic systolic CHF Acute hypoxic respiratory failure (HCC)due to acute HFrEF, COPD exacerbation. Viral panel negative. history of HFrEF with reduced EF, follows with the advanced heart  failure team.  Historically, he has been noncompliant with all of his medications.   - Continue supplemental oxygen to maintain oxygen saturation above 88% - Wean as tolerated - ambulate with pulse ox  Acute exacerbation is likely a combination of noncompliance and acute viral infection. - Consult advanced heart failure team - Lasix 80 mg IV BID - Restart GDMT as tolerated and able  - Strict in and out - Daily weights   COPD (chronic obstructive pulmonary disease) (HCC) Patient endorsing increased cough and shortness of breath, predominantly due to CHF with may be an aspect of underlying COPD exacerbation.  Not much wheezing on examination. - Start prednisone 40 mg daily - DuoNebs every 6 hours - Pulmicort twice daily   Ventricular tachycardia (HCC) 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  No prior AICD firing.  Patient states that he is still taking amiodarone, however last refill was in September 2024.   - Hold amiodarone for now pending cardiology evaluation   Type 2 diabetes mellitus with hyperglycemia (HCC) Last A1c of 9.2%. - A1c pending - SSI, moderate   CAD (coronary artery disease) Patient has a history of obstructive CAD s/p DES to left circumflex in 2021.  Patient is noncompliant with both aspirin and Zetia.  No chest pain reported at this time. - Restart aspirin and Zetia - Lipid panel and A1c for risk stratification  Body mass index is 34.22 kg/m.  VTE ppx: lovenox  Diet:     Diet   Diet heart healthy/carb modified Room service appropriate? Yes; Fluid consistency: Thin   Consultants: Heart failure  Subjective 09/28/23    Pt reports feeling dizzy. No CP or SOB at rest. Continues cough.    Objective   Vitals:   09/28/23 0229 09/28/23 0500 09/28/23 0530 09/28/23 0823  BP:  122/80 129/81   Pulse:  66 71   Resp:  16 (!) 23   Temp: 98.1 F (36.7 C)   98.2 F (36.8 C)  TempSrc: Oral    Oral  SpO2:  97% 96%   Weight:      Height:        Intake/Output Summary (Last 24 hours) at 09/28/2023 0830 Last data filed at 09/27/2023 1747 Gross per 24 hour  Intake --  Output 475 ml  Net -475 ml   Filed Weights   09/27/23 0950  Weight: 96.2 kg     Physical Exam:  General: awake, alert, NAD HEENT: atraumatic, clear conjunctiva, anicteric sclera, MMM, hearing grossly normal Respiratory: normal respiratory effort. Cardiovascular: quick capillary refill, normal S1/S2, RRR, no JVD, murmurs Gastrointestinal: soft, NT, ND Nervous: A&O x3. no gross focal neurologic deficits, normal speech Extremities: moves all equally, no edema, normal tone Skin: dry, intact, normal temperature, normal color. No rashes, lesions or ulcers on exposed skin Psychiatry: normal mood, congruent affect  Labs   I have personally reviewed the following labs and imaging studies CBC    Component Value Date/Time   WBC 13.1 (H) 09/28/2023 0424   RBC 5.34 09/28/2023 0424   HGB 15.7 09/28/2023 0424   HCT 46.9 09/28/2023 0424   PLT 198 09/28/2023 0424   MCV 87.8 09/28/2023 0424   MCH 29.4 09/28/2023 0424   MCHC 33.5 09/28/2023 0424   RDW 15.9 (H) 09/28/2023 0424   LYMPHSABS 1.5 09/28/2023 0424   MONOABS 1.2 (H) 09/28/2023 0424   EOSABS 0.3 09/28/2023 0424   BASOSABS 0.1 09/28/2023 0424      Latest Ref Rng & Units 09/28/2023    4:24 AM 09/27/2023    9:56 AM 07/13/2023    3:20 PM  BMP  Glucose 70 - 99 mg/dL 696  295  284   BUN 8 - 23 mg/dL 14  11  20    Creatinine 0.61 - 1.24 mg/dL 1.32  4.40  1.02   BUN/Creat Ratio 10 - 24   15   Sodium 135 - 145 mmol/L 136  139  142   Potassium 3.5 - 5.1 mmol/L 3.6  3.5  3.9   Chloride 98 - 111 mmol/L 104  108  101   CO2 22 - 32 mmol/L 23  20  21    Calcium 8.9 - 10.3 mg/dL 8.7  9.1  9.4     DG Chest 2 View Result Date: 09/27/2023 CLINICAL DATA:  Shortness of breath.  Bradycardia. EXAM: CHEST - 2 VIEW COMPARISON:  02/25/2023. FINDINGS: Mild central vascular  congestion, similar to the prior study. No frank pulmonary edema. Bilateral lung fields are otherwise clear. No dense consolidation or lung collapse. Bilateral costophrenic angles are clear. Stable cardio-mediastinal silhouette. There is a left sided 2-lead pacemaker. No acute osseous abnormalities. The soft tissues are within normal limits. IMPRESSION: *Mild central vascular congestion, similar to the prior study. No frank pulmonary edema. Electronically Signed   By: Jules Schick [name]   On: 09/27/2023 13:15    Disposition Plan & Communication  Patient status: Inpatient  Admitted From: Home Planned disposition location: Home  Anticipated discharge date: 3/27 pending ambulation without desat  Family Communication: none at bedside    Author: Leeroy Bock, DO [facility] 09/28/2023, 8:30 AM   Available by Epic secure chat 7AM-7PM. If 7PM-7AM, please contact night-coverage.  TR[facility] contact information found on [email or web address].

## 2023-09-28 NOTE — Progress Notes (Signed)
 Pt admit to floor. He refused vitals check and assessment.  He said leave my room. Charge nurse Morrie Sheldon attempt to talk with him. Pt asked her to leave the room

## 2023-09-28 NOTE — Progress Notes (Signed)
 Went in patient room to round on patient. Patient in bathroom with shower running.  Patient stated that he is ok.

## 2023-09-28 NOTE — Plan of Care (Signed)

## 2023-09-29 ENCOUNTER — Encounter: Payer: Self-pay | Admitting: Internal Medicine

## 2023-09-29 DIAGNOSIS — I509 Heart failure, unspecified: Secondary | ICD-10-CM | POA: Diagnosis not present

## 2023-09-29 DIAGNOSIS — J9601 Acute respiratory failure with hypoxia: Secondary | ICD-10-CM | POA: Diagnosis not present

## 2023-09-29 LAB — GLUCOSE, CAPILLARY
Glucose-Capillary: 184 mg/dL — ABNORMAL HIGH (ref 70–99)
Glucose-Capillary: 257 mg/dL — ABNORMAL HIGH (ref 70–99)
Glucose-Capillary: 288 mg/dL — ABNORMAL HIGH (ref 70–99)
Glucose-Capillary: 421 mg/dL — ABNORMAL HIGH (ref 70–99)

## 2023-09-29 LAB — BASIC METABOLIC PANEL WITH GFR
Anion gap: 13 (ref 5–15)
BUN: 27 mg/dL — ABNORMAL HIGH (ref 8–23)
CO2: 23 mmol/L (ref 22–32)
Calcium: 9.2 mg/dL (ref 8.9–10.3)
Chloride: 100 mmol/L (ref 98–111)
Creatinine, Ser: 1.54 mg/dL — ABNORMAL HIGH (ref 0.61–1.24)
GFR, Estimated: 49 mL/min — ABNORMAL LOW (ref >60)
Glucose, Bld: 156 mg/dL — ABNORMAL HIGH (ref 70–99)
Potassium: 3.7 mmol/L (ref 3.5–5.1)
Sodium: 136 mmol/L (ref 135–145)

## 2023-09-29 MED ORDER — IPRATROPIUM-ALBUTEROL 0.5-2.5 (3) MG/3ML IN SOLN: 3.0000 mL | RESPIRATORY_TRACT | Status: DC

## 2023-09-29 MED ORDER — FUROSEMIDE 10 MG/ML IJ SOLN
80.0000 mg | Freq: Every day | INTRAMUSCULAR | Status: DC
  Administered 2023-09-29: 80 mg via INTRAVENOUS
  Filled 2023-09-29: qty 8

## 2023-09-29 MED ORDER — IPRATROPIUM-ALBUTEROL 0.5-2.5 (3) MG/3ML IN SOLN: 3.0000 mL | Freq: Four times a day (QID) | RESPIRATORY_TRACT | Status: DC | PRN

## 2023-09-29 NOTE — Consult Note (Signed)
 Advanced Heart Failure Team Consult Note  Primary Physician: Patient, No Pcp Per Cardiologist:  Lorine Bears, MD HF Cardiologist: Dr. Gasper Lloyd  Reason for Consultation: Acute on Chronic Systolic Heart Failure Interval hx:   - Euvolemic on exam - Reports having some shortness of breath overnight that has now resolved.   Objective:    Vital Signs:   Temp:  [97.4 F (36.3 C)-98.6 F (37 C)] 98.6 F (37 C) (03/27 0714) Pulse Rate:  [63-77] 68 (03/27 0714) Resp:  [18-19] 18 (03/27 0714) BP: (119-143)/(79-95) 122/95 (03/27 0714) SpO2:  [88 %-97 %] 92 % (03/27 0714) Weight:  [88.8 kg] 88.8 kg (03/27 0645)   Weight change: Filed Weights   09/27/23 0950 09/29/23 0645  Weight: 96.2 kg 88.8 kg   Intake/Output:   Intake/Output Summary (Last 24 hours) at 09/29/2023 0935 Last data filed at 09/29/2023 0839 Gross per 24 hour  Intake 3 ml  Output --  Net 3 ml     Physical Exam    General: NAD Cardiac: S1 and S2 present. No murmurs or rub. Resp: mild exp wheezing Abdomen: Taut, non-tender, distended.  Extremities: Warm and dry.  No edema Neuro: AAOX3; no FND  Telemetry   SR in 70s with occasional PVCs (personally reviewed)  EKG    ST 75 bpm with PVCs (personally reviewed)  Labs   Basic Metabolic Panel: Recent Labs  Lab 09/27/23 0956 09/28/23 0424 09/29/23 0330  NA 139 136 136  K 3.5 3.6 3.7  CL 108 104 100  CO2 20* 23 23  GLUCOSE 148* 126* 156*  BUN 11 14 27*  CREATININE 1.00 1.21 1.54*  CALCIUM 9.1 8.7* 9.2  MG  --  2.2  --    CBC: Recent Labs  Lab 09/27/23 0956 09/28/23 0424  WBC 12.1* 13.1*  NEUTROABS  --  10.0*  HGB 15.5 15.7  HCT 46.2 46.9  MCV 86.8 87.8  PLT 201 198   Cardiac Enzymes: No results for input(s): "CKTOTAL", "CKMB", "CKMBINDEX", "TROPONINI" in the last 168 hours.  BNP: BNP (last 3 results) Recent Labs    03/30/23 1157 07/13/23 1520 09/27/23 0956  BNP 452.5* 279.5* 603.3*   ProBNP (last 3 results) No results for  input(s): "PROBNP" in the last 8760 hours.  CBG: Recent Labs  Lab 09/28/23 0820 09/28/23 1205 09/28/23 1716 09/28/23 1920 09/29/23 0734  GLUCAP 141* 237* 384* 346* 184*    Coagulation Studies: No results for input(s): "LABPROT", "INR" in the last 72 hours.  Imaging   No results found.  Medications:    Current Medications:  aspirin EC  81 mg Oral Daily   carvedilol  3.125 mg Oral BID WC   enoxaparin (LOVENOX) injection  45 mg Subcutaneous Q24H   ezetimibe  10 mg Oral Daily   insulin aspart  0-15 Units Subcutaneous TID WC   predniSONE  40 mg Oral Q breakfast   sacubitril-valsartan  1 tablet Oral BID   sodium chloride flush  3 mL Intravenous Q12H    Infusions:   Patient Profile   [name]. is a 68 y.o. male with CAD, DM2, PVCs, former smoker, previous CVA, HTN, OSA and chronic systolic HF.   Assessment/Plan   Acute on chronic systolic heart failure due to iCM - acute exacerbation in the setting on medication noncompliance (reported that he was off all HF medications at last AHF appointment 08/15/23 as he felt they were not working, extensively dicussed importance with him) and possible recent viral infection -  Echo 8/24 EF 30-35% mod MR RV ok. - s/p MDT ICD - continue Entresto 24/26 mg bid - Unable to tolerate eplerenone due to anxiety. Unable to tolerate Entresto due to fatigue - He did not tolerate Jardiance in the past due to fatigue and weakness.   - Euvolemic on exam today consistent with rise in sCr; hold diuretics today.  - Start torsemide 20mg  daily tomorrow. Will arrange for 1 week follow up. Stable for discharge from HFrEF standpoint; although, he still reports some dyspnea that I believe his related to his underlying lung disease.   Acute hypoxic respiratory failure - in the setting of acute CHF and volume overload and likely COPD exacerbation - prednisone and nebs per primary - euvolemic; mild wheezing on exam.   CAD  - s/p remote MI. S/p PCI  LAD, LCx and RCA - Cath 6/23: patent LAD and left circumflex stents with chronically occluded right coronary artery with left-to-right collaterals.  Medical therapy was recommended. - No s/s angina - Continue ASA, has refused Plavix - Follows with Dr. Kirke Corin    HTN - BP up after being off all meds. Refused to start HTN med prior.   H/o ventricular tachycardia: - Status post ICD placement.   - he has stopped amio    PVCs - noted on EKG and tele   Mild OSA - Sleep study 1/24 with mild OSA (AHI 9)  - Does not wear CPAP   Anxiety - Symptoms improved with Zoloft 50 but he stopped it - Follows with PCP  Noncompliance remains a large issue.   Length of Stay: 2  Aryaa Bunting, DO  09/29/2023, 9:35 AM  Advanced Heart Failure Team Pager [phone] (M-F; 7a - 5p)  Please contact [facility] for night-coverage after hours (4p -7a ) and weekends on [email or web address]

## 2023-09-29 NOTE — Plan of Care (Signed)

## 2023-09-29 NOTE — Inpatient Diabetes Management (Signed)
 Inpatient Diabetes Program Recommendations  AACE/ADA: New Consensus Statement on Inpatient Glycemic Control (2015)  Target Ranges:  Prepandial:   less than 140 mg/dL      Peak postprandial:   less than 180 mg/dL (1-2 hours)      Critically ill patients:  140 - 180 mg/dL    Latest Reference Range & Units 09/27/23 16:33  Hemoglobin A1C 4.8 - 5.6 % 6.4 (H)  (H): Data is abnormally high  Latest Reference Range & Units 09/28/23 08:20 09/28/23 12:05 09/28/23 17:16 09/28/23 19:20  Glucose-Capillary 70 - 99 mg/dL 696 (H)  2 units Novolog  237 (H)  5 units Novolog  384 (H)  15 units Novolog  346 (H)  (H): Data is abnormally high  Latest Reference Range & Units 09/29/23 07:34  Glucose-Capillary 70 - 99 mg/dL 295 (H)  3 units Novolog   (H): Data is abnormally high   Admit with:  Acute on chronic systolic CHF Acute hypoxic respiratory failure  History: DM, CVA, COPD  Home DM Meds: None Listed  Current Orders: Novolog Moderate Correction Scale/ SSI (0-15 units) TID AC    MD- Note pt getting Prednisone 40 mg daily  Please consider starting Novolog Meal Coverage: Novolog 6 units TID with meals HOLD if pt NPO HOLD if pt eats <50% meals    --Will follow patient during hospitalization--  Ambrose Finland RN, MSN, CDCES Diabetes Coordinator Inpatient Glycemic Control Team Team Pager: [phone] (8a-5p)

## 2023-09-29 NOTE — Progress Notes (Addendum)
   09/29/23 1000  Spiritual Encounters  Type of Visit Initial  Care provided to: Patient  Referral source Chaplain assessment  Reason for visit Routine spiritual support  OnCall Visit Yes  Spiritual Framework  Presenting Themes Goals in life/care;Coping tools;Courage hope and growth  Interventions  Spiritual Care Interventions Made Established relationship of care and support;Compassionate presence;Reflective listening;Encouragement  Intervention Outcomes  Outcomes Connection to spiritual care;Connection to values and goals of care;Awareness of support   Pt stated he'd like to shave; One of the nurses said she'd pass along the message. Pt was concerned about the decision to stay for more tests or go home.

## 2023-09-29 NOTE — Progress Notes (Signed)
 SATURATION QUALIFICATIONS: (This note is used to comply with regulatory documentation for home oxygen)  Patient Saturations on Room Air at Rest = 89%  Patient Saturations on Room Air while Ambulating = 82%  Patient Saturations on 2 Liters of oxygen while Ambulating = 92%  Please briefly explain why patient needs home oxygen: desaturation with ambulation
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
## 2023-09-29 NOTE — Progress Notes (Signed)
 Education Assessment and Provision:  Detailed education and instructions provided on heart failure disease management including the following:  Signs and symptoms of Heart Failure When to call the physician Importance of daily weights Low sodium diet Fluid restriction Medication management Anticipated future follow-up appointments  Patient education given on each of the above topics.  Patient acknowledges understanding via teach back method and acceptance of all instructions.  Education Materials:  "Living Better With Heart Failure" Booklet, HF zone tool, & Daily Weight Tracker Tool.  Patient has scale at home: Pt says he was given one in the past but he needs to find it so he can start doing daily weights. He currently is not doing them. Patient has pill box at home: No. He is currently not using a pill box but requested a Red medication bag from the HF clinic.  Pt also expressed an interest in medications mailed to his home.  Hospital Follow-Up scheduled 10/07/23 @ 9:30 AM.  Patient aware of appointment, date, time and location.  Roxy Horseman, RN, BSN [facility] Heart Failure Navigator Secure Chat Only

## 2023-09-29 NOTE — Progress Notes (Signed)
 PROGRESS NOTE  [name].    DOB: 11/25/1955, 68 y.o.  RUE:[id number]    Code Status: Full Code   DOA: 09/27/2023   LOS: 2   Brief hospital course  Glen Calandra. is a 68 y.o. male with medical history significant of chronic HFrEF with last EF of 30-35%, CAD, type 2 diabetes, COPD, CVA, hypertension, OSA, recurrent VT s/p ICD, who presents to the ED due to shortness of breath.   [name] states he has been experiencing gradually worsening shortness of breath, abdominal distention and lower extremity edema for the last few months but notes that his shortness of breath became very severe over the last 24 hours.  He endorses a worsening productive cough.  He endorses some chills over the last day and thinks his son may have recently been sick.  He denies any nausea, vomiting, abdominal pain, chest pain, palpitations.   He states that he has been taking Coreg, aspirin and amiodarone, however these medications have not been refilled recently.   ED course:163/116 with heart rate of 78.  He was saturating at 91% on 2 L.  He was afebrile at 97.8.Initial workup notable for WBC of 12.1, glucose 148, BNP 603 with troponin of 18.  COVID-19, influenza and RSV PCR negative.  Chest x-ray with mild vascular congestion.  Patient started on IV Lasix and TRH contacted for admission.  09/29/23 -improving to 89-90 at rest while on room air. Still dyspnea and unable to ambulate  Assessment & Plan  Principal Problem:   Acute hypoxic respiratory failure (HCC) Active Problems:   Acute on chronic systolic CHF (congestive heart failure) (HCC)   COPD (chronic obstructive pulmonary disease) (HCC)   CAD (coronary artery disease)   Type 2 diabetes mellitus with hyperglycemia (HCC)   Ventricular tachycardia (HCC)   Acute on chronic congestive heart failure (HCC)  Acute on chronic systolic CHF Acute hypoxic respiratory failure (HCC)due to acute HFrEF, COPD exacerbation. Viral panel negative. history of HFrEF  with reduced EF, follows with the advanced heart failure team.  Historically, he has been noncompliant with all of his medications.   - Continue supplemental oxygen to maintain oxygen saturation above 88% - Wean as tolerated - ambulate with pulse ox - Consult advanced heart failure team - Lasix 80 mg IV daily - Restart GDMT as tolerated and able  - Strict in and out - Daily weights  Dizziness with head turning. Sounds consistent with BPPV - PT evaluation   COPD (chronic obstructive pulmonary disease) (HCC) Patient endorsing increased cough and shortness of breath, predominantly due to CHF with may be an aspect of underlying COPD exacerbation.  Not much wheezing on examination. - continue prednisone 40 mg daily - DuoNebs every 6 hours - Pulmicort twice daily   Ventricular tachycardia (HCC) 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  No prior AICD firing.  Patient states that he is still taking amiodarone, however last refill was in September 2024. - on carvedilol   Type 2 diabetes mellitus with hyperglycemia (HCC) A1c 6.4 - SSI, moderate   CAD (coronary artery disease) Patient has a history of obstructive CAD s/p DES to left circumflex in 2021.  Patient is noncompliant with both aspirin and Zetia.  No chest pain reported at this time. - Restart aspirin and Zetia  Body mass index is 31.59 kg/m.  VTE ppx: lovenox  Diet:     Diet   Diet heart healthy/carb  modified Room service appropriate? Yes; Fluid consistency: Thin   Consultants: Heart failure  Subjective 09/29/23    Pt reports feeling dizzy. No CP or SOB at rest. Continues cough.    Objective   Vitals:   09/28/23 1919 09/28/23 1956 09/29/23 0645 09/29/23 0714  BP: 132/88  128/88 (!) 122/95  Pulse: 76  63 68  Resp: 19   18  Temp: 98.6 F (37 C)  (!) 97.4 F (36.3 C) 98.6 F (37 C)  TempSrc:   Oral   SpO2: 91% (!) 88% 97% 92%  Weight:   88.8  kg   Height:       No intake or output data in the 24 hours ending 09/29/23 0742  Filed Weights   09/27/23 0950 09/29/23 0645  Weight: 96.2 kg 88.8 kg     Physical Exam:  General: awake, alert, NAD HEENT: atraumatic, clear conjunctiva, anicteric sclera, MMM, hearing grossly normal Respiratory: normal respiratory effort. Cardiovascular: quick capillary refill, normal S1/S2, RRR, no JVD, murmurs Gastrointestinal: soft, NT, ND Nervous: A&O x3. no gross focal neurologic deficits, normal speech Extremities: moves all equally, trace LE edema, normal tone Skin: dry, intact, normal temperature, normal color. No rashes, lesions or ulcers on exposed skin Psychiatry: normal mood, congruent affect  Labs   I have personally reviewed the following labs and imaging studies CBC    Component Value Date/Time   WBC 13.1 (H) 09/28/2023 0424   RBC 5.34 09/28/2023 0424   HGB 15.7 09/28/2023 0424   HCT 46.9 09/28/2023 0424   PLT 198 09/28/2023 0424   MCV 87.8 09/28/2023 0424   MCH 29.4 09/28/2023 0424   MCHC 33.5 09/28/2023 0424   RDW 15.9 (H) 09/28/2023 0424   LYMPHSABS 1.5 09/28/2023 0424   MONOABS 1.2 (H) 09/28/2023 0424   EOSABS 0.3 09/28/2023 0424   BASOSABS 0.1 09/28/2023 0424      Latest Ref Rng & Units 09/29/2023    3:30 AM 09/28/2023    4:24 AM 09/27/2023    9:56 AM  BMP  Glucose 70 - 99 mg/dL 782  956  213   BUN 8 - 23 mg/dL 27  14  11    Creatinine 0.61 - 1.24 mg/dL 0.86  5.78  4.69   Sodium 135 - 145 mmol/L 136  136  139   Potassium 3.5 - 5.1 mmol/L 3.7  3.6  3.5   Chloride 98 - 111 mmol/L 100  104  108   CO2 22 - 32 mmol/L 23  23  20    Calcium 8.9 - 10.3 mg/dL 9.2  8.7  9.1     DG Chest 2 View Result Date: 09/27/2023 CLINICAL DATA:  Shortness of breath.  Bradycardia. EXAM: CHEST - 2 VIEW COMPARISON:  02/25/2023. FINDINGS: Mild central vascular congestion, similar to the prior study. No frank pulmonary edema. Bilateral lung fields are otherwise clear. No dense consolidation  or lung collapse. Bilateral costophrenic angles are clear. Stable cardio-mediastinal silhouette. There is a left sided 2-lead pacemaker. No acute osseous abnormalities. The soft tissues are within normal limits. IMPRESSION: *Mild central vascular congestion, similar to the prior study. No frank pulmonary edema. Electronically Signed   By: Jules Schick [name]   On: 09/27/2023 13:15    Disposition Plan & Communication  Patient status: Inpatient  Admitted From: Home Planned disposition location: Home Anticipated discharge date: 3/28 pending ambulation without desat  Family Communication: none at bedside    Author: Leeroy Bock, DO [facility] 09/29/2023, 7:42 AM  Available by Epic secure chat 7AM-7PM. If 7PM-7AM, please contact night-coverage.  [facility] contact information found on [email or web address].

## 2023-09-29 NOTE — Discharge Instructions (Signed)
 Some PCP options in [place] area- not a comprehensive list  [facility]- [phone] [facility]- [phone] [facility]- [phone] [facility]- [phone] [facility]- [phone] [facility]- [phone]  or [facility] Physician Referral Line [phone]

## 2023-09-30 ENCOUNTER — Other Ambulatory Visit: Payer: Self-pay

## 2023-09-30 DIAGNOSIS — I5023 Acute on chronic systolic (congestive) heart failure: Secondary | ICD-10-CM | POA: Diagnosis not present

## 2023-09-30 DIAGNOSIS — J9601 Acute respiratory failure with hypoxia: Secondary | ICD-10-CM | POA: Diagnosis not present

## 2023-09-30 LAB — GLUCOSE, CAPILLARY
Glucose-Capillary: 221 mg/dL — ABNORMAL HIGH (ref 70–99)
Glucose-Capillary: 341 mg/dL — ABNORMAL HIGH (ref 70–99)

## 2023-09-30 LAB — BASIC METABOLIC PANEL WITH GFR
Anion gap: 11 (ref 5–15)
BUN: 45 mg/dL — ABNORMAL HIGH (ref 8–23)
CO2: 24 mmol/L (ref 22–32)
Calcium: 9.2 mg/dL (ref 8.9–10.3)
Chloride: 100 mmol/L (ref 98–111)
Creatinine, Ser: 1.61 mg/dL — ABNORMAL HIGH (ref 0.61–1.24)
GFR, Estimated: 47 mL/min — ABNORMAL LOW (ref >60)
Glucose, Bld: 181 mg/dL — ABNORMAL HIGH (ref 70–99)
Potassium: 4.1 mmol/L (ref 3.5–5.1)
Sodium: 135 mmol/L (ref 135–145)

## 2023-09-30 LAB — GLUCOSE, RANDOM: Glucose, Bld: 343 mg/dL — ABNORMAL HIGH (ref 70–99)

## 2023-09-30 MED ORDER — INSULIN GLARGINE-YFGN 100 UNIT/ML ~~LOC~~ SOLN
5.0000 [IU] | Freq: Every day | SUBCUTANEOUS | Status: DC
Start: 2023-09-30 — End: 2023-09-30
  Administered 2023-09-30: 5 [IU] via SUBCUTANEOUS
  Filled 2023-09-30: qty 0.05

## 2023-09-30 MED ORDER — CARVEDILOL 3.125 MG PO TABS
3.1250 mg | ORAL_TABLET | Freq: Two times a day (BID) | ORAL | 0 refills | Status: DC
  Filled 2023-09-30 (×2): qty 120, 60d supply, fill #0

## 2023-09-30 MED ORDER — STIOLTO RESPIMAT 2.5-2.5 MCG/ACT IN AERS
2.0000 | INHALATION_SPRAY | Freq: Every day | RESPIRATORY_TRACT | 0 refills | Status: AC
Start: 2023-09-30 — End: 2023-10-30
  Filled 2023-09-30 (×2): qty 4, 30d supply, fill #0

## 2023-09-30 MED ORDER — TORSEMIDE 20 MG PO TABS
20.0000 mg | ORAL_TABLET | Freq: Every day | ORAL | 0 refills | Status: DC
  Filled 2023-09-30 (×2): qty 60, 60d supply, fill #0

## 2023-09-30 MED ORDER — SACUBITRIL-VALSARTAN 24-26 MG PO TABS
1.0000 | ORAL_TABLET | Freq: Two times a day (BID) | ORAL | 0 refills | Status: DC
  Filled 2023-09-30 (×2): qty 120, 60d supply, fill #0

## 2023-09-30 MED ORDER — INSULIN ASPART 100 UNIT/ML IJ SOLN
0.0000 [IU] | Freq: Three times a day (TID) | INTRAMUSCULAR | Status: DC
  Administered 2023-09-30: 5 [IU] via SUBCUTANEOUS
  Administered 2023-09-30: 11 [IU] via SUBCUTANEOUS
  Filled 2023-09-30 (×2): qty 1

## 2023-09-30 NOTE — Discharge Summary (Signed)
 Physician Discharge Summary  Patient: [name]. WGN:[id number] DOB: December 07, 1955   Code Status: Full Code Admit date: 09/27/2023 Discharge date: 09/30/2023 Disposition: Home, No home health services recommended PCP: Patient, No Pcp Per  Recommendations for Outpatient Follow-up:  Follow up with PCP within 1-2 weeks Regarding general hospital follow up and preventative care Recommend BMP- mild AKI from diuresis Follow up with pulmonology Refilled inhaler. Discharged on 2L although day of dc he ambulated with PT and had O2 >88% ORA Follow up with cardiology Regarding CHF. Prescribed medications to bed so he had 60 day supply at dc  Discharge Diagnoses:  Principal Problem:   Acute hypoxic respiratory failure (HCC) Active Problems:   Acute on chronic systolic CHF (congestive heart failure) (HCC)   COPD (chronic obstructive pulmonary disease) (HCC)   CAD (coronary artery disease)   Type 2 diabetes mellitus with hyperglycemia (HCC)   Ventricular tachycardia (HCC)   Acute on chronic congestive heart failure [facility])  Brief Hospital Course Summary: [name]. is a 68 y.o. male with medical history significant of chronic HFrEF with last EF of 30-35%, CAD, type 2 diabetes, COPD, CVA, hypertension, OSA, recurrent VT s/p ICD, who presents to the ED due to shortness of breath.   Mr. Wages states he has been experiencing gradually worsening shortness of breath, abdominal distention and lower extremity edema for the last few months but notes that his shortness of breath became very severe over the last 24 hours.  He endorses a worsening productive cough.  He endorses some chills over the last day and thinks his son may have recently been sick.  He denies any nausea, vomiting, abdominal pain, chest pain, palpitations.   He states that he has been taking Coreg, aspirin and amiodarone, however these medications have not been refilled recently.   ED course:163/116 with heart rate of 78.  He was  saturating at 91% on 2 L.  He was afebrile at 97.8.Initial workup notable for WBC of 12.1, glucose 148, BNP 603 with troponin of 18.  COVID-19, influenza and RSV PCR negative.  Chest x-ray with mild vascular congestion.  Patient started on IV Lasix and TRH contacted for admission.   09/30/23 -improving to 89-90 at rest while on room air. Still dyspnea and unable to ambulate  Discharged with O2  All other chronic conditions were treated with home medications.    Discharge Condition: Stable, improved Recommended discharge diet: Regular healthy diet  Consultations: Cardiology   Procedures/Studies: None   Allergies as of 09/30/2023       Reactions   Contrast Media [iodinated Contrast Media] Shortness Of Breath   Iohexol Shortness Of Breath    Onset Date: [id number]   Jardiance [empagliflozin] Other (See Comments)   Fatigue/weakness   Atorvastatin Other (See Comments)   Myalgias   Hydrocodone Itching   Morphine And Codeine Other (See Comments)   Lost control    Penicillins Other (See Comments)   Unknown reaction Did it involve swelling of the face/tongue/throat, SOB, or low BP? Unknown Did it involve sudden or severe rash/hives, skin peeling, or any reaction on the inside of your mouth or nose? Unknown Did you need to seek medical attention at a hospital or doctor's office? Yes When did it last happen?      Childhood allergy  If all above answers are "NO", may proceed with cephalosporin use.   Rosuvastatin Other (See Comments)   Myalgias        Medication List  STOP taking these medications    amiodarone 200 MG tablet Commonly known as: PACERONE   furosemide 40 MG tablet Commonly known as: LASIX   losartan 50 MG tablet Commonly known as: COZAAR   naproxen sodium 220 MG tablet Commonly known as: ALEVE   sertraline 50 MG tablet Commonly known as: ZOLOFT   spironolactone 25 MG tablet Commonly known as: ALDACTONE       TAKE these medications    aspirin EC  81 MG tablet Take 1 tablet (81 mg total) by mouth daily. Swallow whole.   carvedilol 3.125 MG tablet Commonly known as: COREG Take 1 tablet (3.125 mg total) by mouth 2 (two) times daily with a meal. What changed:  medication strength how much to take   ezetimibe 10 MG tablet Commonly known as: ZETIA Take 1 tablet (10 mg total) by mouth daily.   nitroGLYCERIN 0.4 MG SL tablet Commonly known as: NITROSTAT Place 1 tablet (0.4 mg total) under the tongue every 5 (five) minutes as needed for chest pain.   phenylephrine 0.5 % nasal solution Commonly known as: NEO-SYNEPHRINE Place 1 drop into both nostrils at bedtime.   potassium chloride SA 20 MEQ tablet Commonly known as: KLOR-CON M Take 1 tablet (20 mEq total) by mouth daily.   sacubitril-valsartan 24-26 MG Commonly known as: ENTRESTO Take 1 tablet by mouth 2 (two) times daily.   sodium chloride 0.65 % Soln nasal spray Commonly known as: OCEAN Place 1 spray into both nostrils as needed for congestion.   Stiolto Respimat 2.5-2.5 MCG/ACT Aers Generic drug: Tiotropium Bromide-Olodaterol Inhale 2 puffs into the lungs daily.   torsemide 20 MG tablet Commonly known as: DEMADEX Take 1 tablet (20 mg total) by mouth daily.   Ventolin HFA 108 (90 Base) MCG/ACT inhaler Generic drug: albuterol Inhale 2 puffs into the lungs every 6 (six) hours as needed for wheezing or shortness of breath.               Durable Medical Equipment  (From admission, onward)           Start     Ordered   09/30/23 0747  For home use only DME oxygen  Once       Question Answer Comment  Length of Need 6 Months   Mode or (Route) Nasal cannula   Liters per Minute 2   Frequency Continuous (stationary and portable oxygen unit needed)   Oxygen conserving device Yes   Oxygen delivery system Gas      09/30/23 0746            Follow-up Information     [facility]. Go on 10/07/2023.   Specialty:  Cardiology Why: Hospital Follow-Up 10/07/23 @ 9:30 AM Pleae bring all medications to follow-up appointment [facility], [address] Free Valet Parking @ the [employer] account planner information: [address] [place] [address] [phone]        Salena Saner, MD Follow up in 1 week(s).   Specialty: Pulmonary Disease                Subjective   Pt reports ***  All questions and concerns were addressed at time of discharge.  Objective  Blood pressure 104/87, pulse 66, temperature 98 F (36.7 C), temperature source Oral, resp. rate 16, height 5\' 6"  (1.676 m), weight 97 kg, SpO2 90%.   General: Pt is alert, awake, not in acute distress Cardiovascular: RRR,  S1/S2 +, no rubs, no gallops Respiratory: CTA bilaterally, no wheezing, no rhonchi Abdominal: Soft, NT, ND, bowel sounds + Extremities: no edema, no cyanosis  The results of significant diagnostics from this hospitalization (including imaging, microbiology, ancillary and laboratory) are listed below for reference.   Imaging studies: DG Chest 2 View Result Date: 09/27/2023 CLINICAL DATA:  Shortness of breath.  Bradycardia. EXAM: CHEST - 2 VIEW COMPARISON:  02/25/2023. FINDINGS: Mild central vascular congestion, similar to the prior study. No frank pulmonary edema. Bilateral lung fields are otherwise clear. No dense consolidation or lung collapse. Bilateral costophrenic angles are clear. Stable cardio-mediastinal silhouette. There is a left sided 2-lead pacemaker. No acute osseous abnormalities. The soft tissues are within normal limits. IMPRESSION: *Mild central vascular congestion, similar to the prior study. No frank pulmonary edema. Electronically Signed   By: Jules Schick [name]   On: 09/27/2023 13:15   CUP PACEART REMOTE DEVICE CHECK Result Date: 09/08/2023 Scheduled remote reviewed. Normal device function.  1 NSVT classified event c/w atrial driven 1:1, HR 167 Next remote  91 days. LA, CVRS   Labs: Basic Metabolic Panel: Recent Labs  Lab 09/27/23 0956 09/28/23 0424 09/29/23 0330 09/29/23 2357 09/30/23 0609  NA 139 136 136  --  135  K 3.5 3.6 3.7  --  4.1  CL 108 104 100  --  100  CO2 20* 23 23  --  24  GLUCOSE 148* 126* 156* 343* 181*  BUN 11 14 27*  --  45*  CREATININE 1.00 1.21 1.54*  --  1.61*  CALCIUM 9.1 8.7* 9.2  --  9.2  MG  --  2.2  --   --   --    CBC: Recent Labs  Lab 09/27/23 0956 09/28/23 0424  WBC 12.1* 13.1*  NEUTROABS  --  10.0*  HGB 15.5 15.7  HCT 46.2 46.9  MCV 86.8 87.8  PLT 201 198   Microbiology: Results for orders placed or performed during the hospital encounter of 09/27/23  Resp panel by RT-PCR (RSV, Flu A&B, Covid) Anterior Nasal Swab     Status: None   Collection Time: 09/27/23  9:56 AM   Specimen: Anterior Nasal Swab  Result Value Ref Range Status   SARS Coronavirus 2 by RT PCR NEGATIVE NEGATIVE Final    Comment: (NOTE) SARS-CoV-2 target nucleic acids are NOT DETECTED.  The SARS-CoV-2 RNA is generally detectable in upper respiratory specimens during the acute phase of infection. The lowest concentration of SARS-CoV-2 viral copies this assay can detect is 138 copies/mL. A negative result does not preclude SARS-Cov-2 infection and should not be used as the sole basis for treatment or other patient management decisions. A negative result may occur with  improper specimen collection/handling, submission of specimen other than nasopharyngeal swab, presence of viral mutation(s) within the areas targeted by this assay, and inadequate number of viral copies(<138 copies/mL). A negative result must be combined with clinical observations, patient history, and epidemiological information. The expected result is Negative.  Fact Sheet for Patients:  BloggerCourse.com  Fact Sheet for Healthcare Providers:  SeriousBroker.it  This test is no t yet approved or  cleared by the [place] FDA and  has been authorized for detection and/or diagnosis of SARS-CoV-2 by FDA under an Emergency Use Authorization (EUA). This EUA will remain  in effect (meaning this test can be used) for the duration of the COVID-19 declaration under Section 564(b)(1) of the Act, 21 U.S.C.section 360bbb-3(b)(1), unless the authorization is terminated  or revoked  sooner.       Influenza A by PCR NEGATIVE NEGATIVE Final   Influenza B by PCR NEGATIVE NEGATIVE Final    Comment: (NOTE) The Xpert Xpress SARS-CoV-2/FLU/RSV plus assay is intended as an aid in the diagnosis of influenza from Nasopharyngeal swab specimens and should not be used as a sole basis for treatment. Nasal washings and aspirates are unacceptable for Xpert Xpress SARS-CoV-2/FLU/RSV testing.  Fact Sheet for Patients: BloggerCourse.com  Fact Sheet for Healthcare Providers: SeriousBroker.it  This test is not yet approved or cleared by the [place] FDA and has been authorized for detection and/or diagnosis of SARS-CoV-2 by FDA under an Emergency Use Authorization (EUA). This EUA will remain in effect (meaning this test can be used) for the duration of the COVID-19 declaration under Section 564(b)(1) of the Act, 21 U.S.C. section 360bbb-3(b)(1), unless the authorization is terminated or revoked.     Resp Syncytial Virus by PCR NEGATIVE NEGATIVE Final    Comment: (NOTE) Fact Sheet for Patients: BloggerCourse.com  Fact Sheet for Healthcare Providers: SeriousBroker.it  This test is not yet approved or cleared by the [place] FDA and has been authorized for detection and/or diagnosis of SARS-CoV-2 by FDA under an Emergency Use Authorization (EUA). This EUA will remain in effect (meaning this test can be used) for the duration of the COVID-19 declaration under Section 564(b)(1) of the Act, 21  U.S.C. section 360bbb-3(b)(1), unless the authorization is terminated or revoked.  Performed at [facility], [address] [place], [place] [address]   Respiratory (~20 pathogens) panel by PCR     Status: None   Collection Time: 09/27/23  3:21 PM   Specimen: Nasopharyngeal Swab; Respiratory  Result Value Ref Range Status   Adenovirus NOT DETECTED NOT DETECTED Final   Coronavirus 229E NOT DETECTED NOT DETECTED Final    Comment: (NOTE) The Coronavirus on the Respiratory Panel, DOES NOT test for the novel  Coronavirus (2019 nCoV)    Coronavirus HKU1 NOT DETECTED NOT DETECTED Final   Coronavirus NL63 NOT DETECTED NOT DETECTED Final   Coronavirus OC43 NOT DETECTED NOT DETECTED Final   Metapneumovirus NOT DETECTED NOT DETECTED Final   Rhinovirus / Enterovirus NOT DETECTED NOT DETECTED Final   Influenza A NOT DETECTED NOT DETECTED Final   Influenza B NOT DETECTED NOT DETECTED Final   Parainfluenza Virus 1 NOT DETECTED NOT DETECTED Final   Parainfluenza Virus 2 NOT DETECTED NOT DETECTED Final   Parainfluenza Virus 3 NOT DETECTED NOT DETECTED Final   Parainfluenza Virus 4 NOT DETECTED NOT DETECTED Final   Respiratory Syncytial Virus NOT DETECTED NOT DETECTED Final   Bordetella pertussis NOT DETECTED NOT DETECTED Final   Bordetella Parapertussis NOT DETECTED NOT DETECTED Final   Chlamydophila pneumoniae NOT DETECTED NOT DETECTED Final   Mycoplasma pneumoniae NOT DETECTED NOT DETECTED Final    Comment: Performed at [facility], [address], [place], [place] [address]    Time coordinating discharge: Over 30 minutes  Leeroy Bock, MD  [facility] 09/30/2023, 11:04 AM

## 2023-09-30 NOTE — Consult Note (Signed)
 Advanced Heart Failure Team Consult Note  Primary Physician: Patient, No Pcp Per Cardiologist:  Lorine Bears, MD HF Cardiologist: Dr. Gasper Lloyd  Reason for Consultation: Acute on Chronic Systolic Heart Failure Interval hx:   - Reports that he slept well; dyspnea has resolved.   Objective:    Vital Signs:   Temp:  [97.4 F (36.3 C)-97.9 F (36.6 C)] 97.5 F (36.4 C) (03/28 0818) Pulse Rate:  [66-76] 66 (03/28 0818) Resp:  [16-19] 16 (03/28 0818) BP: (85-124)/(54-88) 104/87 (03/28 0818) SpO2:  [90 %-96 %] 90 % (03/28 0906) Weight:  [97 kg] 97 kg (03/28 0316) Last BM Date :  (PTA) Weight change: Filed Weights   09/27/23 0950 09/29/23 0645 09/30/23 0316  Weight: 96.2 kg 88.8 kg 97 kg   Intake/Output:   Intake/Output Summary (Last 24 hours) at 09/30/2023 0906 Last data filed at 09/29/2023 1500 Gross per 24 hour  Intake 480 ml  Output --  Net 480 ml     Physical Exam    Vitals:   09/30/23 0818 09/30/23 0906  BP: 104/87   Pulse: 66   Resp: 16   Temp: (!) 97.5 F (36.4 C)   SpO2: 93% 90%   GENERAL: NAD Lungs- CTA CARDIAC:  JVP: 6 cm          Normal rate with regular rhythm. No murmur.  Pulses 2+. No edema.  ABDOMEN: Soft, non-tender, non-distended.  EXTREMITIES: Warm and well perfused.  NEUROLOGIC: No obvious FND   Telemetry   SR in 70s with occasional PVCs (personally reviewed)  EKG    ST 75 bpm with PVCs (personally reviewed)  Labs   Basic Metabolic Panel: Recent Labs  Lab 09/27/23 0956 09/28/23 0424 09/29/23 0330 09/29/23 2357 09/30/23 0609  NA 139 136 136  --  135  K 3.5 3.6 3.7  --  4.1  CL 108 104 100  --  100  CO2 20* 23 23  --  24  GLUCOSE 148* 126* 156* 343* 181*  BUN 11 14 27*  --  45*  CREATININE 1.00 1.21 1.54*  --  1.61*  CALCIUM 9.1 8.7* 9.2  --  9.2  MG  --  2.2  --   --   --    CBC: Recent Labs  Lab 09/27/23 0956 09/28/23 0424  WBC 12.1* 13.1*  NEUTROABS  --  10.0*  HGB 15.5 15.7  HCT 46.2 46.9  MCV 86.8 87.8   PLT 201 198   Cardiac Enzymes: No results for input(s): "CKTOTAL", "CKMB", "CKMBINDEX", "TROPONINI" in the last 168 hours.  BNP: BNP (last 3 results) Recent Labs    03/30/23 1157 07/13/23 1520 09/27/23 0956  BNP 452.5* 279.5* 603.3*   ProBNP (last 3 results) No results for input(s): "PROBNP" in the last 8760 hours.  CBG: Recent Labs  Lab 09/29/23 0734 09/29/23 1105 09/29/23 1501 09/29/23 2127 09/30/23 0817  GLUCAP 184* 257* 288* 421* 221*    Coagulation Studies: No results for input(s): "LABPROT", "INR" in the last 72 hours.  Imaging   No results found.  Medications:    Current Medications:  aspirin EC  81 mg Oral Daily   carvedilol  3.125 mg Oral BID WC   enoxaparin (LOVENOX) injection  45 mg Subcutaneous Q24H   ezetimibe  10 mg Oral Daily   insulin aspart  0-15 Units Subcutaneous TID AC & HS   insulin glargine-yfgn  5 Units Subcutaneous Daily   predniSONE  40 mg Oral Q breakfast   sacubitril-valsartan  1 tablet Oral BID   sodium chloride flush  3 mL Intravenous Q12H    Infusions:   Patient Profile   [name]. is a 68 y.o. male with CAD, DM2, PVCs, former smoker, previous CVA, HTN, OSA and chronic systolic HF.   Assessment/Plan   Acute on chronic systolic heart failure due to iCM - acute exacerbation in the setting on medication noncompliance (reported that he was off all HF medications at last AHF appointment 08/15/23 as he felt they were not working, extensively dicussed importance with him) and possible recent viral infection - Echo 8/24 EF 30-35% mod MR RV ok. - s/p MDT ICD - continue Entresto 24/26 mg bid - Unable to tolerate eplerenone due to anxiety. Unable to tolerate Entresto due to fatigue - He did not tolerate Jardiance in the past due to fatigue and weakness.   - Euvolemic to hypovolemic based on exam & lab work.  - Renal function will improve. Will repeat labs outpatient next week.  - From a heart failure standpoint, patient  stable for discharge.  - D/C home on Entresto 24/26mg  BID, coreg 3.125mg  BID. Start torsemide 20mg  daily on 10/03/23.   Acute hypoxic respiratory failure - in the setting of acute CHF and volume overload and likely COPD exacerbation - prednisone and nebs per primary - euvolemic  CAD  - s/p remote MI. S/p PCI LAD, LCx and RCA - Cath 6/23: patent LAD and left circumflex stents with chronically occluded right coronary artery with left-to-right collaterals.  Medical therapy was recommended. - No s/s angina - Continue ASA, has refused Plavix - Follows with Dr. Kirke Corin    HTN - BP up after being off all meds. Refused to start HTN med prior.   H/o ventricular tachycardia: - Status post ICD placement.   - he has stopped amio    PVCs - noted on EKG and tele   Mild OSA - Sleep study 1/24 with mild OSA (AHI 9)  - Does not wear CPAP   Anxiety - Symptoms improved with Zoloft 50 but he stopped it - Follows with PCP  Noncompliance remains a large issue.   Length of Stay: 3  Glen Klaiber, DO  09/30/2023, 9:06 AM  Advanced Heart Failure Team Pager [phone] (M-F; 7a - 5p)  Please contact [facility] for night-coverage after hours (4p -7a ) and weekends on [email or web address]

## 2023-09-30 NOTE — Evaluation (Signed)
 Physical Therapy Evaluation Patient Details Name: [name]. MRN: [id number] DOB: [birth year] Today's Date: 09/30/2023  History of Present Illness  [name]. is a 68 y.o. male with medical history significant of chronic HFrEF with last EF of 30-35%, CAD, type 2 diabetes, COPD, CVA, hypertension, OSA, recurrent VT s/p ICD, who presents to the ED due to shortness of breath.  Clinical Impression  Patient received standing up in room eating breakfast, dressed in street clothes. He is agreeable to PT assessment. Patient reports he has O2 at home, does not normally use. He was received on room air. O2 sats > 90% at rest and after walking. Patient is independent with all mobility at this time and does not require skilled PT follow up.         If plan is discharge home, recommend the following:  N/A   Can travel by private vehicle    yes    Equipment Recommendations None recommended by PT  Recommendations for Other Services    N/A   Functional Status Assessment Patient has not had a recent decline in their functional status     Precautions / Restrictions Precautions Precautions: None Restrictions Weight Bearing Restrictions Per Provider Order: No      Mobility  Bed Mobility               General bed mobility comments: NT patient received standing up in room eating breakfast    Transfers Overall transfer level: Independent                      Ambulation/Gait Ambulation/Gait assistance: Independent Gait Distance (Feet): 150 Feet Assistive device: None Gait Pattern/deviations: Step-through pattern Gait velocity: WFL     General Gait Details: No difficulties with ambulation. O2 sats remained > 90% on room air.  Stairs            Wheelchair Mobility     Tilt Bed    Modified Rankin (Stroke Patients Only)       Balance Overall balance assessment: Independent                                           Pertinent  Vitals/Pain Pain Assessment Pain Assessment: No/denies pain    Home Living Family/patient expects to be discharged to:: Private residence Living Arrangements: Alone   Type of Home: House Home Access: Stairs to enter   [employer] of Steps: 2     Home Equipment: None      Prior Function Prior Level of Function : Independent/Modified Independent;Working/employed;Driving             Mobility Comments: 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, lives alone, 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 ADLs Comments: independent     Extremity/Trunk Assessment   Upper Extremity Assessment Upper Extremity Assessment: Overall WFL for tasks assessed    Lower Extremity Assessment Lower Extremity Assessment: Overall WFL for tasks assessed    Cervical / Trunk Assessment Cervical / Trunk Assessment: Normal  Communication   Communication Communication: No apparent difficulties    Cognition Arousal: Alert Behavior During Therapy: WFL for tasks assessed/performed, Flat affect   PT - Cognitive impairments: No apparent impairments  Cueing Cueing Techniques: Verbal cues     General Comments      Exercises     Assessment/Plan    PT Assessment Patient does not need any further PT services  PT Problem List         PT Treatment Interventions      PT Goals (Current goals can be found in the Care Plan section)  Acute Rehab PT Goals Patient Stated Goal: to return home PT Goal Formulation: With patient Time For Goal Achievement: 10/03/23 Potential to Achieve Goals: Good    Frequency       Co-evaluation               AM-PAC PT "6 Clicks" Mobility  Outcome Measure Help needed turning from your back to your side while in a flat bed without using bedrails?: None Help needed moving from lying on your back to sitting on the side of a flat bed without using bedrails?: None Help needed moving to and from a bed to a chair (including a  wheelchair)?: None Help needed standing up from a chair using your arms (e.g., wheelchair or bedside chair)?: None Help needed to walk in hospital room?: None Help needed climbing 3-5 steps with a railing? : None 6 Click Score: 24    End of Session   Activity Tolerance: Patient tolerated treatment well Patient left: Other (comment) (standing in room) Nurse Communication: Mobility status      Time: 1610-9604 PT Time Calculation (min) (ACUTE ONLY): 9 min   Charges:   PT Evaluation $PT Eval Low Complexity: 1 Low   PT General Charges $$ ACUTE PT VISIT: 1 Visit         [name], PT, GCS 09/30/23,9:11 AM

## 2023-09-30 NOTE — Inpatient Diabetes Management (Signed)
 Inpatient Diabetes Program Recommendations  AACE/ADA: New Consensus Statement on Inpatient Glycemic Control (2015)  Target Ranges:  Prepandial:   less than 140 mg/dL      Peak postprandial:   less than 180 mg/dL (1-2 hours)      Critically ill patients:  140 - 180 mg/dL    Latest Reference Range & Units 09/29/23 07:34 09/29/23 11:05 09/29/23 15:01 09/29/23 21:27  Glucose-Capillary 70 - 99 mg/dL 161 (H)  3 units Novolog  40 mg Prednisone  257 (H)  8 units Novolog  288 (H)  8 units Novolog  421 (H)  11 units Novolog @0130   (H): Data is abnormally high     Home DM Meds: None Listed   Current Orders: Novolog Moderate Correction Scale/ SSI (0-15 units) TID AC       MD- Note pt getting Prednisone 40 mg daily  CBGs significantly elevated yest afternoon and evening likely due to the Prednisone   Please consider starting Novolog Meal Coverage: Novolog 6 units TID with meals HOLD if pt NPO HOLD if pt eats <50% meals    --Will follow patient during hospitalization--  Ambrose Finland RN, MSN, CDCES Diabetes Coordinator Inpatient Glycemic Control Team Team Pager: [phone] (8a-5p)

## 2023-09-30 NOTE — TOC Transition Note (Signed)
 Transition of Care [facility]) - Discharge Note   Patient Details  Name: [name]. MRN: [id number] Date of Birth: 01-Nov-1955  [facility] CM/SW Contact:  Margarito Liner, LCSW Phone Number: 09/30/2023, 11:20 AM   Clinical Narrative: Patient has orders to discharge home today. [employer] liaison confirmed the oxygen tank was delivered to the room. No further concerns. CSW signing off.    Final next level of care: Home/Self Care Barriers to Discharge: Barriers Resolved   Patient Goals and CMS Choice            Discharge Placement                Patient to be transferred to facility by: Self   Patient and family notified of of transfer: 09/30/23  Discharge Plan and Services Additional resources added to the After Visit Summary for       Post Acute Care Choice: Durable Medical Equipment          DME Arranged: Oxygen DME Agency: [employer] Date DME Agency Contacted: 09/29/23   Representative spoke with at DME Agency: Marthann Schiller            Social Drivers of Health (SDOH) Interventions SDOH Screenings   Food Insecurity: No Food Insecurity (09/29/2023)  Housing: Low Risk  (09/29/2023)  Transportation Needs: No Transportation Needs (09/29/2023)  Utilities: Not At Risk (09/29/2023)  Depression (PHQ2-9): Low Risk  (03/09/2022)  Financial Resource Strain: Medium Risk (09/29/2023)  Physical Activity: Inactive (04/30/2019)  Social Connections: Moderately Isolated (09/29/2023)  Stress: No Stress Concern Present (04/30/2019)  Tobacco Use: Medium Risk (09/29/2023)     Readmission Risk Interventions     No data to display

## 2023-10-06 ENCOUNTER — Telehealth: Payer: Self-pay | Admitting: Family

## 2023-10-06 NOTE — Progress Notes (Unsigned)
 ADVANCED HF CLINIC NOTE  Referring Physician: Lorine Bears, MD Primary Care: Patient, No Pcp Per Primary Cardiologist: Lorine Bears, MD (last seen 05/24)  Chief Complaint: shortness of breath   HPI:  [name]. is a 68 y.o. male with CAD, DM2, PVCs, former smoker, previous CVA, HTN, OSA, COPD, VT s/p ICD and chronic systolic HF.  He has known history of CAD s/p  remote MI with previous LAD,diagonal and RCA stenting at [facility]. Cath 2016 showed patent LAD and diagonal stents and chronically occluded RCA and left to right collaterals.  He also has chronic systolic heart failure due to ischemic cardiomyopathy EF 25-30%  Cath 2/21 with 3v CAD with patent stents in the LAD and diagonal without significant restenosis, chronically occluded RCA stents with right to right and left-to-right collaterals and significant stenosis in the distal left circumflex supplying a relatively small OM 3 distribution.  Ejection fraction was 25 to 30%.  Right heart catheterization showed normal filling pressures, mild pulmonary hypertension and normal cardiac output.  Medical therapy was recommended.  Had NSTEMI 4/21 Cath showed occluded distal left circumflex which was treated successfully with PCI and drug-eluting stent placement.     He was admitted 06/23 with chest pain and heart failure.  Echo EF 30-35%. Cath showed patent LAD and LCx stents with chronically occluded RCA with L-to-R collaterals.  Medical therapy recommended. He did not fill his prescriptions. He returned to the ED after he was stung by multiple yellow jackets and was found to be in VT at 175 bpm. He was cardioverted with 120 J. He went back into VT and was treated successfully with IV amiodarone.     Admitted 07/23 with VT in the setting of not taking amiodarone as prescribed. He ultimately underwent ICD placement by Dr. Lalla Brothers in October.  He was seen 12/23. He was not taking ntresto regularly because it dragged him down. Stopped  entresto and added eplerenone. He stopped eplerenone because it made him feel anxious and he couldn't sleep. Losartan as well as sertraline for anxiety was started.   Sleep study 1/24 with mild OSA (AHI 9)   Echo 02/03/23 EF 30-35% mod MR RV ok.   Had cut back on lasix and was not taking regularly. Seen on 02/25/23 in Pulmonary office with 6-7 pound weight gain and increased SOB. CXR showed pulmonary edema. BNP elevated, Lasix restarted at 80 daily (was supposed to be on 40 daily but only taking a couple times/week). Also treated with prednisone and inhalers for possible COPD flare  Admitted 09/27/23 with shortness of breath due to HF exacerbation due to medication noncompliance. Initial BP 163/116. Chest x-ray with mild vascular congestion. Initially needed oxygen. Cardiology consulted. IV diuresed with transition to oral diuretics.   He presents today for a HF follow-up visit with a chief complaint of shortness of breath (improving). Has associated head congestion, fatigue, dizziness, occasional pedal edema and cough along with this. Denies chest pain, palpitations, abdominal distention, difficulty sleeping or weight gain. Reports sleeping well on 1 pillow. Has his medications and says that he has been taking them.    ROS: All systems negative except what is listed in HPI, PMH and Problem List   Past Medical History:  Diagnosis Date   Anginal pain (HCC)    Arthritis    CAD (coronary artery disease)    a. s/p MI with LAD and Diag stenting @ [name];  b. 06/2015 Cath: LAD 38m/d ISR, 100 RCA (ISR) w/ L->R collats,  otw mod nonobs dzs-->Med Rx; c. 08/2019 Cath: LM nl, LAD 10p/m ISR, D1 20, D2 100, RI min irregs, LCX 67m/d, OM1 100, OM2 50, RCA 100p, 70d. RPDA fills via collats from LAD. EF 25-35%-->Med Rx; d. 10/2019 NSTEMI/Cath: LCX now 100 (2.75x15 Resolute Onyx DES), otw stable compared to 08/2019.   Chronic combined systolic and diastolic CHF (congestive heart failure) (HCC)    a. 06/2015 Echo: EF  20-25%, Gr3 DD; b. 10/2019 Echo: EF 25-30%, glob HK, sev inf/infapical HK. Mod dil LA.   Dyspnea    Essential hypertension    Headache 12/23/2021   Hyperlipidemia    Hypokalemia    a. 06/2015 in setting of diuresis.   Ischemic cardiomyopathy    a. 2011 EF 45% ([name]);  b. 06/2015 Echo: EF 20-25%; c. 04/2019 Echo: EF 40-45%; d. 08/2019 LV gram: EF 25-35%; e. 10/2019 Echo: EF 25-30%.   PVC's (premature ventricular contractions)    a. 09/2019 Zio (3 days): Avg hr 70, 4 runs NSVT, 5 runs SVT, rare PACs, frequent PVCs w/ 11.8% burden.   Sleep apnea    Stroke [facility])    Stroke/Right temporal lobe infarction [facility])    a. 10/2019 MRI brain: 1cm acute ischemic nonhemorrhagic R temporal lobe infarct. Age-related cerebral atrophy w/ moderate chronic small vessel ischemic dzs.   Type 2 diabetes mellitus with hyperglycemia (HCC) 10/08/2019    Current Outpatient Medications  Medication Sig Dispense Refill   albuterol (VENTOLIN HFA) 108 (90 Base) MCG/ACT inhaler Inhale 2 puffs into the lungs every 6 (six) hours as needed for wheezing or shortness of breath. 54 g 2   aspirin EC 81 MG tablet Take 1 tablet (81 mg total) by mouth daily. Swallow whole. 90 tablet 3   carvedilol (COREG) 3.125 MG tablet Take 1 tablet (3.125 mg total) by mouth 2 (two) times daily with a meal. 120 tablet 0   ezetimibe (ZETIA) 10 MG tablet Take 1 tablet (10 mg total) by mouth daily. (Patient not taking: Reported on 09/27/2023) 90 tablet 3   nitroGLYCERIN (NITROSTAT) 0.4 MG SL tablet Place 1 tablet (0.4 mg total) under the tongue every 5 (five) minutes as needed for chest pain. 90 tablet 3   phenylephrine (NEO-SYNEPHRINE) 0.5 % nasal solution Place 1 drop into both nostrils at bedtime.     potassium chloride SA (KLOR-CON M) 20 MEQ tablet Take 1 tablet (20 mEq total) by mouth daily. (Patient not taking: Reported on 06/08/2023) 30 tablet 3   sacubitril-valsartan (ENTRESTO) 24-26 MG Take 1 tablet by mouth 2 (two) times daily. 120 tablet 0    sodium chloride (OCEAN) 0.65 % SOLN nasal spray Place 1 spray into both nostrils as needed for congestion. (Patient not taking: Reported on 09/27/2023)     Tiotropium Bromide-Olodaterol (STIOLTO RESPIMAT) 2.5-2.5 MCG/ACT AERS Inhale 2 puffs into the lungs daily. 4 g 0   torsemide (DEMADEX) 20 MG tablet Take 1 tablet (20 mg total) by mouth daily. 60 tablet 0   No current facility-administered medications for this visit.    Allergies  Allergen Reactions   Contrast Media [Iodinated Contrast Media] Shortness Of Breath   Iohexol Shortness Of Breath     Onset Date: [id number]    Jardiance [Empagliflozin] Other (See Comments)    Fatigue/weakness   Atorvastatin Other (See Comments)    Myalgias    Hydrocodone Itching   Morphine And Codeine Other (See Comments)    Lost control    Penicillins Other (See Comments)    Unknown reaction Did it  involve swelling of the face/tongue/throat, SOB, or low BP? Unknown Did it involve sudden or severe rash/hives, skin peeling, or any reaction on the inside of your mouth or nose? Unknown Did you need to seek medical attention at a hospital or doctor's office? Yes When did it last happen?      Childhood allergy  If all above answers are "NO", may proceed with cephalosporin use.    Rosuvastatin Other (See Comments)    Myalgias       Social History   Socioeconomic History   Marital status: Widowed    Spouse name: Not on file   Number of children: 2   Years of education: Not on file   Highest education level: GED or equivalent  Occupational History   Occupation: Statistician   Tobacco Use   Smoking status: Former    Current packs/day: 0.00    Types: Cigarettes    Quit date: 06/10/2011    Years since quitting: 12.3   Smokeless tobacco: Never  Vaping Use   Vaping status: Not on file  Substance and Sexual Activity   Alcohol use: No    Alcohol/week: 0.0 standard drinks of alcohol   Drug use: No   Sexual activity: Not Currently  Other Topics  Concern   Not on file  Social History Narrative   Lives alone.   Social Drivers of Health   Financial Resource Strain: Medium Risk (09/29/2023)   Overall Financial Resource Strain (CARDIA)    Difficulty of Paying Living Expenses: Somewhat hard  Food Insecurity: No Food Insecurity (09/29/2023)   Hunger Vital Sign    Worried About Running Out of Food in the Last Year: Never true    Ran Out of Food in the Last Year: Never true  Transportation Needs: No Transportation Needs (09/29/2023)   PRAPARE - Administrator, Civil Service (Medical): No    Lack of Transportation (Non-Medical): No  Physical Activity: Inactive (04/30/2019)   Exercise Vital Sign    Days of Exercise per Week: 0 days    Minutes of Exercise per Session: 0 min  Stress: No Stress Concern Present (04/30/2019)   [employer] of Occupational Health - Occupational Stress Questionnaire    Feeling of Stress : Not at all  Social Connections: Moderately Isolated (09/29/2023)   Social Connection and Isolation Panel [NHANES]    Frequency of Communication with Friends and Family: More than three times a week    Frequency of Social Gatherings with Friends and Family: More than three times a week    Attends Religious Services: Never    Database administrator or Organizations: Yes    Attends Banker Meetings: Never    Marital Status: Divorced  Catering manager Violence: Not At Risk (09/29/2023)   Humiliation, Afraid, Rape, and Kick questionnaire    Fear of Current or Ex-Partner: No    Emotionally Abused: No    Physically Abused: No    Sexually Abused: No      Family History  Problem Relation Age of Onset   Coronary artery disease Mother    Diabetes Mother    Coronary artery disease Father     Vitals:   10/07/23 0925  BP: (!) 148/87  Pulse: 71  SpO2: 98%  Weight: 202 lb (91.6 kg)   Wt Readings from Last 3 Encounters:  10/07/23 202 lb (91.6 kg)  09/30/23 213 lb 13.5 oz (97 kg)  09/27/23 212  lb (96.2 kg)   Lab Results  Component Value Date   CREATININE 1.61 (H) 09/30/2023   CREATININE 1.54 (H) 09/29/2023   CREATININE 1.21 09/28/2023    PHYSICAL EXAM:  General: Well appearing. No resp difficulty HEENT: normal Neck: supple, no JVD Cor: Regular rhythm, rate. No rubs, gallops or murmurs Lungs: clear Abdomen: soft, nontender, nondistended. Extremities: no cyanosis, clubbing, rash, trace pitting edema bilateral lower legs Neuro: alert & oriented X 3. Moves all 4 extremities w/o difficulty. Affect pleasant  EKG: NSR with HR 69 (personally reviewed)  Medtronic device interrogated: Optivol reading is at 0, no AF/ VT events   ASSESSMENT & PLAN:  1.  Chronic systolic heart failure due to iCM - Echo 6/23 EF 30-35%.   - Echo 02/03/23 EF 30-35% mod MR RV ok.  - s/p MDT ICD - NYHA class III - euvolemic - weight down 9 pounds from last visit here 2 months ago - continue carvedilol 3.125mg  BID - he is agreeable to starting farxiga 10mg  daily; had weakness/ fatigue with jardiance so he will let us know if he is unable to tolerate farxiga - continue furosemide 40mg  PRN - continue entresto 24/26mg  BID (had fatigue with previous use of this; seems to be currently tolerating it) - Unable to tolerate eplerenone due to anxiety.  - hesitant to use MRA due to compliance - BNP 09/27/23 was 603.3  2. CAD  - s/p remote MI. S/p PCI LAD, LCx and RCA - Cath 6/23: patent LAD and left circumflex stents with chronically occluded right coronary artery with left-to-right collaterals.  Medical therapy was recommended. - No s/s angina - continue ASA 81mg  daily - doesn't tolerate statins, stopped zetia - saw cardiology Kirke Corin) 05/24  3  HTN:  - BP 148/87 - options put on AVS for primary care - BMET 09/30/23 reviewed and showed sodium 135, potassium 4.1, creatinine 1.61 & GFR 47 - BMET today  4. H/o ventricular tachycardia: - Status post ICD placement.   - continue amiodarone 200mg  BID -  saw EP (Riddle) 01/25  5. H/o PVCs - Zio 3/21: Avg hr 70, 4 runs NSVT, 5 runs SVT, rare PACs, frequent PVCs w/ 11.8% burden.  6. Mild OSA - Sleep study 1/24 with mild OSA (AHI 9)  - saw pulmonology Jayme Cloud) 03/25  7. Anxiety - Symptoms improved with  Zoloft 50 but he stopped it - options given for PCP practices to get established   Return in 1 month, sooner if needed.   Delma Freeze, FNP  7:05 PM

## 2023-10-06 NOTE — Telephone Encounter (Incomplete)
 Called to confirm/remind patient of their appointment at the [facility] on 10/07/23***.   Appointment:   [x] Confirmed  [] Left mess   [] No answer/No voice mail  [] Phone not in service  Patient reminded to bring all medications and/or complete list.  Confirmed patient has transportation. Gave directions, instructed to utilize valet parking.

## 2023-10-07 ENCOUNTER — Encounter: Payer: Self-pay | Admitting: Family

## 2023-10-07 ENCOUNTER — Telehealth: Payer: Self-pay

## 2023-10-07 ENCOUNTER — Other Ambulatory Visit (HOSPITAL_COMMUNITY): Payer: Self-pay

## 2023-10-07 ENCOUNTER — Other Ambulatory Visit: Payer: Self-pay

## 2023-10-07 ENCOUNTER — Ambulatory Visit: Admitting: Family

## 2023-10-07 ENCOUNTER — Other Ambulatory Visit
Admission: RE | Admit: 2023-10-07 | Discharge: 2023-10-07 | Disposition: A | Discharge status: Home / Self Care | Source: Ambulatory Visit | Attending: Family | Admitting: Family

## 2023-10-07 VITALS — BP 148/87 | HR 71 | Wt 202.0 lb

## 2023-10-07 DIAGNOSIS — I110 Hypertensive heart disease with heart failure: Secondary | ICD-10-CM | POA: Insufficient documentation

## 2023-10-07 DIAGNOSIS — Z955 Presence of coronary angioplasty implant and graft: Secondary | ICD-10-CM | POA: Diagnosis not present

## 2023-10-07 DIAGNOSIS — I5022 Chronic systolic (congestive) heart failure: Secondary | ICD-10-CM | POA: Insufficient documentation

## 2023-10-07 DIAGNOSIS — J449 Chronic obstructive pulmonary disease, unspecified: Secondary | ICD-10-CM | POA: Diagnosis not present

## 2023-10-07 DIAGNOSIS — R9431 Abnormal electrocardiogram [ECG] [EKG]: Secondary | ICD-10-CM | POA: Insufficient documentation

## 2023-10-07 DIAGNOSIS — E119 Type 2 diabetes mellitus without complications: Secondary | ICD-10-CM | POA: Insufficient documentation

## 2023-10-07 DIAGNOSIS — Z8673 Personal history of transient ischemic attack (TIA), and cerebral infarction without residual deficits: Secondary | ICD-10-CM | POA: Diagnosis not present

## 2023-10-07 DIAGNOSIS — F419 Anxiety disorder, unspecified: Secondary | ICD-10-CM

## 2023-10-07 DIAGNOSIS — I10 Essential (primary) hypertension: Secondary | ICD-10-CM

## 2023-10-07 DIAGNOSIS — Z95810 Presence of automatic (implantable) cardiac defibrillator: Secondary | ICD-10-CM | POA: Insufficient documentation

## 2023-10-07 DIAGNOSIS — Z87891 Personal history of nicotine dependence: Secondary | ICD-10-CM | POA: Diagnosis not present

## 2023-10-07 DIAGNOSIS — I255 Ischemic cardiomyopathy: Secondary | ICD-10-CM | POA: Diagnosis not present

## 2023-10-07 DIAGNOSIS — G4733 Obstructive sleep apnea (adult) (pediatric): Secondary | ICD-10-CM | POA: Insufficient documentation

## 2023-10-07 DIAGNOSIS — R0602 Shortness of breath: Secondary | ICD-10-CM | POA: Diagnosis present

## 2023-10-07 DIAGNOSIS — I493 Ventricular premature depolarization: Secondary | ICD-10-CM | POA: Diagnosis not present

## 2023-10-07 DIAGNOSIS — I2510 Atherosclerotic heart disease of native coronary artery without angina pectoris: Secondary | ICD-10-CM | POA: Insufficient documentation

## 2023-10-07 DIAGNOSIS — I472 Ventricular tachycardia, unspecified: Secondary | ICD-10-CM | POA: Insufficient documentation

## 2023-10-07 DIAGNOSIS — I252 Old myocardial infarction: Secondary | ICD-10-CM | POA: Insufficient documentation

## 2023-10-07 DIAGNOSIS — Z79899 Other long term (current) drug therapy: Secondary | ICD-10-CM | POA: Diagnosis present

## 2023-10-07 LAB — BASIC METABOLIC PANEL WITH GFR
Anion gap: 11 (ref 5–15)
BUN: 19 mg/dL (ref 8–23)
CO2: 21 mmol/L — ABNORMAL LOW (ref 22–32)
Calcium: 9.3 mg/dL (ref 8.9–10.3)
Chloride: 104 mmol/L (ref 98–111)
Creatinine, Ser: 1.03 mg/dL (ref 0.61–1.24)
GFR, Estimated: >60 mL/min (ref >60)
Glucose, Bld: 167 mg/dL — ABNORMAL HIGH (ref 70–99)
Potassium: 4.3 mmol/L (ref 3.5–5.1)
Sodium: 136 mmol/L (ref 135–145)

## 2023-10-07 MED ORDER — DAPAGLIFLOZIN PROPANEDIOL 10 MG PO TABS
10.0000 mg | ORAL_TABLET | Freq: Every day | ORAL | 6 refills | Status: DC
Start: 2023-10-07 — End: 2023-11-04
  Filled 2023-10-07: qty 30, 30d supply, fill #0

## 2023-10-07 NOTE — Telephone Encounter (Signed)
 Advanced Heart Failure Patient Advocate Encounter  Prior authorization for Marcelline Deist has been submitted and approved. Test billing returns $4 for 90 day supply.  KeySelinda Michaels Effective: 10/07/2023 to 10/06/2024  Burnell Blanks, CPhT Rx Patient Advocate Phone: [phone]

## 2023-10-07 NOTE — Patient Instructions (Addendum)
 Medication Changes:  START Farxiga 10mg  (1 tab) daily  Lab Work:  Go DOWN to LOWER LEVEL (LL) to have your blood work completed inside of [employer] office.  We will only call you if the results are abnormal or if the provider would like to make medication changes.   Special Instructions // Education:  Please call [facility] to see about getting a new patient appointment scheduled.  Their number is [phone] [address] [place]  You can also try [facility] [phone] They are also located at [address] [place]  Follow-Up in: Please follow up with the [facility] in 1 month with Clarisa Kindred, NP  At the [facility], you and your health needs are our priority. We have a designated team specialized in the treatment of Heart Failure. This Care Team includes your primary Heart Failure Specialized Cardiologist (physician), Advanced Practice Providers (APPs- Physician Assistants and Nurse Practitioners), and Pharmacist who all work together to provide you with the care you need, when you need it.   You may see any of the following providers on your designated Care Team at your next follow up:  Dr. Arvilla Meres Dr. Marca Ancona Dr. Dorthula Nettles Dr. Theresia Bough Clarisa Kindred, FNP Enos Fling, RPH-CPP  Please be sure to bring in all your medications bottles to every appointment.   Need to Contact us:  If you have any questions or concerns before your next appointment please send [place] a message through [place] or call our office at [phone].    TO LEAVE A MESSAGE FOR THE NURSE SELECT OPTION 2, PLEASE LEAVE A MESSAGE INCLUDING: YOUR NAME DATE OF BIRTH CALL BACK NUMBER REASON FOR CALL**this is important as we prioritize the call backs  YOU WILL RECEIVE A CALL BACK THE SAME DAY AS LONG AS YOU CALL BEFORE 4:00 PM

## 2023-10-07 NOTE — Progress Notes (Signed)
 [facility] - HEART FAILURE CLINIC - PHARMACIST COUNSELING NOTE  Adherence Assessment  Do you ever forget to take your medication? [] Yes [x] No  Do you ever skip doses due to side effects?  [] Yes [x] No  Do you have trouble affording your medicines? [] Yes [x] No  Are you ever unable to pick up your medication due to transportation difficulties? [] Yes [x] No  Do you ever stop taking your medications because you don't believe they are helping? [] Yes [x] No  Do you check your weight daily? [] Yes [x] No  Do you check your blood pressure daily?  [] Yes [x] No  Adherence strategy: Pill box; declined   Barriers to obtaining medications: None reported    Vital signs: HR 71, BP 148/87, weight 202 lbs.  ECHO: Date 02/03/23, EF 30-35% Renal function: Date 09/30/23, GFR 47  Current Guideline-Directed Medical Therapy/Evidence Based Medicine  ACE/ARB/ARNI: Sacubitril-valsartan 24-26 mg twice daily Target dose: 97/103 twice daily   Beta Blocker: Carvedilol 3.125 mg twice daily Target dose: 50 mg twice daily  Aldosterone Antagonist: Previously on spironolactone & eplerenone  Target dose: N/A  SGLT2i:  Did not tolerate Jardiance in the past (fatigue/weakness) Target dose: N/A  Diuretic: Furosemide 40 mg PRN  ASSESSMENT 68 year old male who presents to the HF clinic for a follow-up appointment. PMH is significant for CAD, DM2, PVCs, former smoker, previous CVA, HTN, OSA, COPD, VT s/p ICD and chronic systolic HF. He previously stopped all medications and does not believe the medications are helping. Entresto was added back on during last visit; seems to be tolerating this.   Recent ED visit or hospitalization (past 6 months):  Date: 09/27/23 - 09/30/23, CC: SOB, Admission Dx: acute on chronic heart failure   COUNSELING POINTS/CLINICAL PEARLS  Dapagliflozin (Goal: 10 mg daily) Inform patients that the most common adverse reactions are genital mycotic infections, nasopharyngitis  (dapagliflozin), and urinary tract infections. Inform patients that hypotension may occur and advise them to contact their healthcare provider if they experience such symptoms. Inform patients that dehydration may increase the risk for hypotension, and to have adequate fluid intake.    PLAN Continue current regimen as directed by NP  Agreeable to starting [place] today  Co-pay $4.00 for 90-day supply  Consider re-introduction of MRA in the future; hesitant to start at this time given issues with compliance   Time spent: 20 minutes   Littie Deeds, PharmD Pharmacy Resident  10/07/2023 8:59 AM

## 2023-10-11 NOTE — Progress Notes (Signed)
 Remote ICD transmission.

## 2023-10-11 NOTE — Addendum Note (Signed)
 Addended by: Geralyn Flash D on: 10/11/2023 03:38 PM   Modules accepted: Orders

## 2023-10-30 ENCOUNTER — Observation Stay
Admission: EM | Admit: 2023-10-30 | Discharge: 2023-10-31 | Disposition: A | Discharge status: Home / Self Care | Attending: Family Medicine | Admitting: Family Medicine

## 2023-10-30 ENCOUNTER — Other Ambulatory Visit: Payer: Self-pay

## 2023-10-30 ENCOUNTER — Encounter (HOSPITAL_COMMUNITY): Payer: Self-pay

## 2023-10-30 ENCOUNTER — Emergency Department

## 2023-10-30 ENCOUNTER — Encounter: Payer: Self-pay | Admitting: Emergency Medicine

## 2023-10-30 DIAGNOSIS — Z1152 Encounter for screening for COVID-19: Secondary | ICD-10-CM | POA: Insufficient documentation

## 2023-10-30 DIAGNOSIS — R0602 Shortness of breath: Secondary | ICD-10-CM | POA: Diagnosis present

## 2023-10-30 DIAGNOSIS — R42 Dizziness and giddiness: Principal | ICD-10-CM | POA: Insufficient documentation

## 2023-10-30 DIAGNOSIS — R270 Ataxia, unspecified: Secondary | ICD-10-CM

## 2023-10-30 LAB — CBC
HCT: 52.2 % — ABNORMAL HIGH (ref 39.0–52.0)
Hemoglobin: 18.5 g/dL — ABNORMAL HIGH (ref 13.0–17.0)
MCH: 29.8 pg (ref 26.0–34.0)
MCHC: 35.4 g/dL (ref 30.0–36.0)
MCV: 84.2 fL (ref 80.0–100.0)
Platelets: 236 10*3/uL (ref 150–400)
RBC: 6.2 MIL/uL — ABNORMAL HIGH (ref 4.22–5.81)
RDW: 15.2 % (ref 11.5–15.5)
WBC: 13.8 10*3/uL — ABNORMAL HIGH (ref 4.0–10.5)
nRBC: 0 % (ref 0.0–0.2)

## 2023-10-30 LAB — BASIC METABOLIC PANEL WITH GFR
Anion gap: 14 (ref 5–15)
BUN: 17 mg/dL (ref 8–23)
CO2: 18 mmol/L — ABNORMAL LOW (ref 22–32)
Calcium: 9.5 mg/dL (ref 8.9–10.3)
Chloride: 103 mmol/L (ref 98–111)
Creatinine, Ser: 1.02 mg/dL (ref 0.61–1.24)
GFR, Estimated: >60 mL/min (ref >60)
Glucose, Bld: 207 mg/dL — ABNORMAL HIGH (ref 70–99)
Potassium: 4.1 mmol/L (ref 3.5–5.1)
Sodium: 135 mmol/L (ref 135–145)

## 2023-10-30 LAB — RESP PANEL BY RT-PCR (RSV, FLU A&B, COVID)  RVPGX2
Influenza A by PCR: NEGATIVE
Influenza B by PCR: NEGATIVE
Resp Syncytial Virus by PCR: NEGATIVE
SARS Coronavirus 2 by RT PCR: NEGATIVE

## 2023-10-30 LAB — BRAIN NATRIURETIC PEPTIDE: B Natriuretic Peptide: 328.2 pg/mL — ABNORMAL HIGH (ref 0.0–100.0)

## 2023-10-30 LAB — TROPONIN I (HIGH SENSITIVITY): Troponin I (High Sensitivity): 14 ng/L (ref <18)

## 2023-10-30 MED ORDER — METOCLOPRAMIDE HCL 5 MG/ML IJ SOLN
10.0000 mg | Freq: Once | INTRAMUSCULAR | Status: AC
  Administered 2023-10-30: 10 mg via INTRAVENOUS
  Filled 2023-10-30: qty 2

## 2023-10-30 MED ORDER — SODIUM CHLORIDE 0.9 % IV BOLUS
250.0000 mL | Freq: Once | INTRAVENOUS | Status: AC
  Administered 2023-10-30: 250 mL via INTRAVENOUS

## 2023-10-30 MED ORDER — DIAZEPAM 5 MG/ML IJ SOLN
2.5000 mg | Freq: Once | INTRAMUSCULAR | Status: AC
  Administered 2023-10-30: 2.5 mg via INTRAVENOUS
  Filled 2023-10-30: qty 2

## 2023-10-30 MED ORDER — ONDANSETRON HCL 4 MG/2ML IJ SOLN
4.0000 mg | Freq: Once | INTRAMUSCULAR | Status: AC
  Administered 2023-10-30: 4 mg via INTRAVENOUS
  Filled 2023-10-30: qty 2

## 2023-10-30 MED ORDER — MECLIZINE HCL 25 MG PO TABS
25.0000 mg | ORAL_TABLET | Freq: Once | ORAL | Status: AC
  Administered 2023-10-30: 25 mg via ORAL
  Filled 2023-10-30: qty 1

## 2023-10-30 NOTE — ED Notes (Signed)
 Called [facility] to request transfer to Hospitalist services spoke to [id number] @10 :44 who will contact provider and call back for consult

## 2023-10-30 NOTE — ED Notes (Signed)
 Pt to CT at this time.

## 2023-10-30 NOTE — ED Notes (Signed)
 Pts pacemaker interrogated

## 2023-10-30 NOTE — ED Notes (Signed)
 Pt reports increased dizziness and nausea after medication administration. Funke MD made aware

## 2023-10-30 NOTE — ED Triage Notes (Signed)
 Patient to ED via POV for SOB. Pt states it started last night. Also having chills, difficulty concentrating, and vomiting.  Denies fevers at home. Hx CHF

## 2023-10-30 NOTE — ED Notes (Signed)
 Pt SpO2 dropped to upper 80s, placed on 2L [place]

## 2023-10-30 NOTE — ED Notes (Signed)
 CCMD called to place pt on cardiac monitor per order

## 2023-10-30 NOTE — ED Provider Notes (Signed)
 [facility] Provider Note    Event Date/Time   First MD Initiated Contact with Patient 10/30/23 Jerre Moots     (approximate)   History   Shortness of Breath   HPI  Noxx Reeck. is a 68 y.o. male with history of CHF, COPD, ventricular tachycardia who comes in with concerns for shortness of breath.  I reviewed a note where patient was admitted from 3/25 until 3/28.  He has a known history of an EF of 30 to 35% and has had recurrent VT status post ICD placement.  Patient was discharged with oxygen, furosemide  40 mg, spironolactone .  He comes in today with worsening shortness of breath.   Patient to the x-ray reports that he is more here for dizziness which he describes as lightheadedness, nausea.  He reports symptoms started at 9 PM on 10/30/2023.  He reports that the symptoms are worse with movement.  He denies any precipitating factors.  He denies any neck chiropractic, neck injuries.  He denies having anything like this before.   Physical Exam   Triage Vital Signs: ED Triage Vitals  Encounter Vitals Group     BP 10/30/23 1755 (!) 152/104     Systolic BP Percentile --      Diastolic BP Percentile --      Pulse Rate 10/30/23 1755 64     Resp 10/30/23 1755 (!) 23     Temp 10/30/23 1755 97.8 F (36.6 C)     Temp Source 10/30/23 1755 Oral     SpO2 10/30/23 1755 100 %     Weight 10/30/23 1753 210 lb (95.3 kg)     Height 10/30/23 1753 5\' 6"  (1.676 m)     Head Circumference --      Peak Flow --      Pain Score 10/30/23 1753 0     Pain Loc --      Pain Education --      Exclude from Growth Chart --     Most recent vital signs: Vitals:   10/30/23 1755  BP: (!) 152/104  Pulse: 64  Resp: (!) 23  Temp: 97.8 F (36.6 C)  SpO2: 100%     General: Awake, no distress.  CV:  Good peripheral perfusion.  Resp:  Normal effort.  Abd:  No distention.  Other:  Cranial nerves appear intact but he does appear ataxic and no obvious nystagmus or vision  changes.   ED Results / Procedures / Treatments   Labs (all labs ordered are listed, but only abnormal results are displayed) Labs Reviewed  BASIC METABOLIC PANEL WITH GFR - Abnormal; Notable for the following components:      Result Value   CO2 18 (*)    Glucose, Bld 207 (*)    All other components within normal limits  CBC - Abnormal; Notable for the following components:   WBC 13.8 (*)    RBC 6.20 (*)    Hemoglobin 18.5 (*)    HCT 52.2 (*)    All other components within normal limits  RESP PANEL BY RT-PCR (RSV, FLU A&B, COVID)  RVPGX2  BRAIN NATRIURETIC PEPTIDE  URINALYSIS, ROUTINE W REFLEX MICROSCOPIC  TROPONIN I (HIGH SENSITIVITY)     EKG  My interpretation of EKG:  Normal sinus rate of 70 without any ST elevation or T wave inversions, occasional PVC  RADIOLOGY I have reviewed the xray personally and interpreted no edema.  ICD in place  PROCEDURES:  Critical Care performed:  No  .1-3 Lead EKG Interpretation  Performed by: Lubertha Rush, MD Authorized by: Lubertha Rush, MD     Interpretation: normal     ECG rate:  60   ECG rate assessment: normal     Rhythm: sinus rhythm     Ectopy: none     Conduction: normal      MEDICATIONS ORDERED IN ED: Medications  diazepam (VALIUM) injection 2.5 mg (has no administration in time range)  ondansetron  (ZOFRAN ) injection 4 mg (4 mg Intravenous Given 10/30/23 1900)  sodium chloride  0.9 % bolus 250 mL (0 mLs Intravenous Stopped 10/30/23 2143)  metoCLOPramide (REGLAN) injection 10 mg (10 mg Intravenous Given 10/30/23 2209)  meclizine  (ANTIVERT ) tablet 25 mg (25 mg Oral Given 10/30/23 2209)     IMPRESSION / MDM / ASSESSMENT AND PLAN / ED COURSE  I reviewed the triage vital signs and the nursing notes.   Patient's presentation is most consistent with acute presentation with potential threat to life or bodily function.   Patient comes in with onset of dizziness.  Differential includes stroke, electro abnormalities, ACS,  device issues  6:36 PM if this is a stroke he is out of the window for TNK.  Unable to get stat MRI MRA secondary to ICD unable to do that here.  Unable to do stat CTA for LVO given contrast allergy of shortness of breath would need a 4-hour prep therefore out of the window for stroke code (given that after 4 hour prep he would be out of window and greater then 24 hours since onset and I doubt LVO at this time.  I suspect this could be peripheral cause such as vertigo versus central cause such as posterior stroke.  Will give some Zofran .  Given his history of fluid overload I am hesitant to give a lot of fluids given he does report some shortness of breath as well.  Will start off with BNP, chest x-ray and see if we can get orthostatics on him.  BNP was slightly elevated but downtrending before he was difficult to stand up and get orthostatics so a little bit of fluids were given without any improvement.  COVID flu are negative.  Patient's labs do show elevated glucose but no anion gap.  CBC shows slightly elevated white count but no other infectious symptoms we will add on urine.  Initial troponin is negative  11:05 PM Dr Murvin Arthurs from [facility] neuro who was okay with pt admitting to [facility] for MRI rather then admitting to [facility] given unable to MRI here.   Device was interrogated there were no events on the device and I discussed with the device rep he states that the ICD is MRI conditional.  I discussed the case with Dr. Achilles Holes from [facility] for admission for transfer for MRI, admission for intractable dizziness and TIA, stroke workup    The patient is on the cardiac monitor to evaluate for evidence of arrhythmia and/or significant heart rate changes.      FINAL CLINICAL IMPRESSION(S) / ED DIAGNOSES   Final diagnoses:  Dizziness     Rx / DC Orders   ED Discharge Orders     None        Note:  This document was prepared using Dragon voice recognition software and may include  unintentional dictation errors.   Lubertha Rush, MD 10/30/23 [phone]

## 2023-10-30 NOTE — ED Notes (Signed)
 Orthostatic VS:   Supine: 67HR 138/86 Sitting: 76HR 141/85  Standing: 79HR 149/94   Pt endorses increased nausea and dizziness on standing

## 2023-10-30 NOTE — ED Notes (Signed)
 Report received from [facility]. Pt resting on stretcher at this time. Reports is still nauseated, dizzy and has headache. Pt reports dizziness started last night around 2000. Pt provided warm blanket per request

## 2023-10-31 ENCOUNTER — Inpatient Hospital Stay (HOSPITAL_COMMUNITY)

## 2023-10-31 ENCOUNTER — Inpatient Hospital Stay (HOSPITAL_COMMUNITY)
Admission: EM | Admit: 2023-10-31 | Discharge: 2023-11-04 | DRG: 065 | Disposition: A | Discharge status: Rehabilitation Facility | Source: Other Acute Inpatient Hospital | Attending: Physical Medicine and Rehabilitation | Admitting: Physical Medicine and Rehabilitation

## 2023-10-31 ENCOUNTER — Encounter (HOSPITAL_COMMUNITY): Payer: Self-pay | Admitting: Family Medicine

## 2023-10-31 DIAGNOSIS — Z8709 Personal history of other diseases of the respiratory system: Secondary | ICD-10-CM

## 2023-10-31 DIAGNOSIS — R0789 Other chest pain: Secondary | ICD-10-CM | POA: Diagnosis not present

## 2023-10-31 DIAGNOSIS — I10 Essential (primary) hypertension: Secondary | ICD-10-CM | POA: Diagnosis not present

## 2023-10-31 DIAGNOSIS — Z8679 Personal history of other diseases of the circulatory system: Secondary | ICD-10-CM | POA: Diagnosis not present

## 2023-10-31 DIAGNOSIS — R110 Nausea: Secondary | ICD-10-CM | POA: Diagnosis present

## 2023-10-31 DIAGNOSIS — I509 Heart failure, unspecified: Secondary | ICD-10-CM | POA: Diagnosis not present

## 2023-10-31 DIAGNOSIS — Z794 Long term (current) use of insulin: Secondary | ICD-10-CM | POA: Diagnosis not present

## 2023-10-31 DIAGNOSIS — Z602 Problems related to living alone: Secondary | ICD-10-CM | POA: Diagnosis present

## 2023-10-31 DIAGNOSIS — R270 Ataxia, unspecified: Secondary | ICD-10-CM | POA: Diagnosis present

## 2023-10-31 DIAGNOSIS — Z888 Allergy status to other drugs, medicaments and biological substances status: Secondary | ICD-10-CM

## 2023-10-31 DIAGNOSIS — F411 Generalized anxiety disorder: Secondary | ICD-10-CM | POA: Diagnosis present

## 2023-10-31 DIAGNOSIS — J449 Chronic obstructive pulmonary disease, unspecified: Secondary | ICD-10-CM | POA: Diagnosis present

## 2023-10-31 DIAGNOSIS — D72829 Elevated white blood cell count, unspecified: Secondary | ICD-10-CM | POA: Diagnosis present

## 2023-10-31 DIAGNOSIS — E66811 Obesity, class 1: Secondary | ICD-10-CM | POA: Diagnosis present

## 2023-10-31 DIAGNOSIS — E785 Hyperlipidemia, unspecified: Secondary | ICD-10-CM | POA: Diagnosis present

## 2023-10-31 DIAGNOSIS — I252 Old myocardial infarction: Secondary | ICD-10-CM

## 2023-10-31 DIAGNOSIS — Z833 Family history of diabetes mellitus: Secondary | ICD-10-CM

## 2023-10-31 DIAGNOSIS — Z7984 Long term (current) use of oral hypoglycemic drugs: Secondary | ICD-10-CM

## 2023-10-31 DIAGNOSIS — Z95810 Presence of automatic (implantable) cardiac defibrillator: Secondary | ICD-10-CM

## 2023-10-31 DIAGNOSIS — I639 Cerebral infarction, unspecified: Secondary | ICD-10-CM | POA: Diagnosis not present

## 2023-10-31 DIAGNOSIS — R42 Dizziness and giddiness: Secondary | ICD-10-CM | POA: Diagnosis not present

## 2023-10-31 DIAGNOSIS — I5020 Unspecified systolic (congestive) heart failure: Secondary | ICD-10-CM | POA: Diagnosis not present

## 2023-10-31 DIAGNOSIS — Z7982 Long term (current) use of aspirin: Secondary | ICD-10-CM | POA: Diagnosis not present

## 2023-10-31 DIAGNOSIS — I6381 Other cerebral infarction due to occlusion or stenosis of small artery: Principal | ICD-10-CM | POA: Diagnosis present

## 2023-10-31 DIAGNOSIS — Z5329 Procedure and treatment not carried out because of patient's decision for other reasons: Secondary | ICD-10-CM | POA: Diagnosis not present

## 2023-10-31 DIAGNOSIS — I6389 Other cerebral infarction: Secondary | ICD-10-CM | POA: Diagnosis not present

## 2023-10-31 DIAGNOSIS — G4733 Obstructive sleep apnea (adult) (pediatric): Secondary | ICD-10-CM | POA: Diagnosis present

## 2023-10-31 DIAGNOSIS — Z955 Presence of coronary angioplasty implant and graft: Secondary | ICD-10-CM

## 2023-10-31 DIAGNOSIS — I5042 Chronic combined systolic (congestive) and diastolic (congestive) heart failure: Secondary | ICD-10-CM | POA: Diagnosis present

## 2023-10-31 DIAGNOSIS — H547 Unspecified visual loss: Secondary | ICD-10-CM | POA: Diagnosis present

## 2023-10-31 DIAGNOSIS — I2510 Atherosclerotic heart disease of native coronary artery without angina pectoris: Secondary | ICD-10-CM | POA: Diagnosis present

## 2023-10-31 DIAGNOSIS — I63549 Cerebral infarction due to unspecified occlusion or stenosis of unspecified cerebellar artery: Secondary | ICD-10-CM | POA: Diagnosis present

## 2023-10-31 DIAGNOSIS — I493 Ventricular premature depolarization: Secondary | ICD-10-CM | POA: Diagnosis present

## 2023-10-31 DIAGNOSIS — Z91041 Radiographic dye allergy status: Secondary | ICD-10-CM

## 2023-10-31 DIAGNOSIS — Z6833 Body mass index (BMI) 33.0-33.9, adult: Secondary | ICD-10-CM

## 2023-10-31 DIAGNOSIS — E119 Type 2 diabetes mellitus without complications: Secondary | ICD-10-CM | POA: Diagnosis present

## 2023-10-31 DIAGNOSIS — Z8249 Family history of ischemic heart disease and other diseases of the circulatory system: Secondary | ICD-10-CM

## 2023-10-31 DIAGNOSIS — I445 Left posterior fascicular block: Secondary | ICD-10-CM | POA: Diagnosis present

## 2023-10-31 DIAGNOSIS — Z7902 Long term (current) use of antithrombotics/antiplatelets: Secondary | ICD-10-CM

## 2023-10-31 DIAGNOSIS — Z885 Allergy status to narcotic agent status: Secondary | ICD-10-CM

## 2023-10-31 DIAGNOSIS — I255 Ischemic cardiomyopathy: Secondary | ICD-10-CM | POA: Diagnosis present

## 2023-10-31 DIAGNOSIS — Z8673 Personal history of transient ischemic attack (TIA), and cerebral infarction without residual deficits: Secondary | ICD-10-CM

## 2023-10-31 DIAGNOSIS — Z79899 Other long term (current) drug therapy: Secondary | ICD-10-CM

## 2023-10-31 DIAGNOSIS — I110 Hypertensive heart disease with heart failure: Secondary | ICD-10-CM | POA: Diagnosis present

## 2023-10-31 DIAGNOSIS — R29700 NIHSS score 0: Secondary | ICD-10-CM | POA: Diagnosis present

## 2023-10-31 DIAGNOSIS — Z880 Allergy status to penicillin: Secondary | ICD-10-CM

## 2023-10-31 DIAGNOSIS — E1165 Type 2 diabetes mellitus with hyperglycemia: Principal | ICD-10-CM

## 2023-10-31 DIAGNOSIS — Z87891 Personal history of nicotine dependence: Secondary | ICD-10-CM

## 2023-10-31 LAB — CBC
HCT: 47.7 % (ref 39.0–52.0)
Hemoglobin: 16.5 g/dL (ref 13.0–17.0)
MCH: 29.7 pg (ref 26.0–34.0)
MCHC: 34.6 g/dL (ref 30.0–36.0)
MCV: 85.8 fL (ref 80.0–100.0)
Platelets: 220 10*3/uL (ref 150–400)
RBC: 5.56 MIL/uL (ref 4.22–5.81)
RDW: 15.2 % (ref 11.5–15.5)
WBC: 13.3 10*3/uL — ABNORMAL HIGH (ref 4.0–10.5)
nRBC: 0 % (ref 0.0–0.2)

## 2023-10-31 LAB — COMPREHENSIVE METABOLIC PANEL WITH GFR
ALT: 11 U/L (ref 0–44)
AST: 16 U/L (ref 15–41)
Albumin: 3.4 g/dL — ABNORMAL LOW (ref 3.5–5.0)
Alkaline Phosphatase: 61 U/L (ref 38–126)
Anion gap: 11 (ref 5–15)
BUN: 19 mg/dL (ref 8–23)
CO2: 22 mmol/L (ref 22–32)
Calcium: 9.2 mg/dL (ref 8.9–10.3)
Chloride: 106 mmol/L (ref 98–111)
Creatinine, Ser: 1.17 mg/dL (ref 0.61–1.24)
GFR, Estimated: >60 mL/min (ref >60)
Glucose, Bld: 131 mg/dL — ABNORMAL HIGH (ref 70–99)
Potassium: 4 mmol/L (ref 3.5–5.1)
Sodium: 139 mmol/L (ref 135–145)
Total Bilirubin: 1.4 mg/dL — ABNORMAL HIGH (ref 0.0–1.2)
Total Protein: 6.7 g/dL (ref 6.5–8.1)

## 2023-10-31 LAB — GLUCOSE, CAPILLARY
Glucose-Capillary: 151 mg/dL — ABNORMAL HIGH (ref 70–99)
Glucose-Capillary: 226 mg/dL — ABNORMAL HIGH (ref 70–99)
Glucose-Capillary: 183 mg/dL — ABNORMAL HIGH (ref 70–99)
Glucose-Capillary: 164 mg/dL — ABNORMAL HIGH (ref 70–99)

## 2023-10-31 LAB — TROPONIN I (HIGH SENSITIVITY): Troponin I (High Sensitivity): 16 ng/L (ref <18)

## 2023-10-31 MED ORDER — SODIUM CHLORIDE 0.9% FLUSH
3.0000 mL | Freq: Two times a day (BID) | INTRAVENOUS | Status: DC
  Administered 2023-10-31 – 2023-11-04 (×8): 3 mL via INTRAVENOUS

## 2023-10-31 MED ORDER — GADOBUTROL 1 MMOL/ML IV SOLN
9.0000 mL | Freq: Once | INTRAVENOUS | Status: AC | PRN
  Administered 2023-10-31: 9 mL via INTRAVENOUS

## 2023-10-31 MED ORDER — SODIUM CHLORIDE 0.9% FLUSH: 3.0000 mL | INTRAVENOUS | Status: DC | PRN

## 2023-10-31 MED ORDER — DIPHENHYDRAMINE HCL 25 MG PO CAPS
50.0000 mg | ORAL_CAPSULE | Freq: Once | ORAL | Status: DC
Start: 2023-10-31 — End: 2023-10-31

## 2023-10-31 MED ORDER — INSULIN ASPART 100 UNIT/ML IJ SOLN
0.0000 [IU] | Freq: Every day | INTRAMUSCULAR | Status: DC
  Administered 2023-11-02 – 2023-11-03 (×2): 3 [IU] via SUBCUTANEOUS

## 2023-10-31 MED ORDER — ONDANSETRON HCL 4 MG PO TABS: 4.0000 mg | ORAL_TABLET | Freq: Four times a day (QID) | ORAL | Status: DC | PRN

## 2023-10-31 MED ORDER — SODIUM CHLORIDE 0.9% FLUSH
3.0000 mL | Freq: Two times a day (BID) | INTRAVENOUS | Status: DC
  Administered 2023-10-31 (×2): 3 mL via INTRAVENOUS

## 2023-10-31 MED ORDER — CARVEDILOL 3.125 MG PO TABS
3.1250 mg | ORAL_TABLET | Freq: Two times a day (BID) | ORAL | Status: DC
  Administered 2023-10-31 – 2023-11-04 (×9): 3.125 mg via ORAL
  Filled 2023-10-31 (×10): qty 1

## 2023-10-31 MED ORDER — PROCHLORPERAZINE EDISYLATE 10 MG/2ML IJ SOLN
10.0000 mg | Freq: Four times a day (QID) | INTRAMUSCULAR | Status: DC | PRN
  Administered 2023-10-31 – 2023-11-03 (×3): 10 mg via INTRAVENOUS
  Filled 2023-10-31 (×3): qty 2

## 2023-10-31 MED ORDER — IPRATROPIUM-ALBUTEROL 0.5-2.5 (3) MG/3ML IN SOLN: 3.0000 mL | RESPIRATORY_TRACT | Status: DC | PRN

## 2023-10-31 MED ORDER — ONDANSETRON HCL 4 MG/2ML IJ SOLN: 4.0000 mg | Freq: Four times a day (QID) | INTRAMUSCULAR | Status: DC | PRN

## 2023-10-31 MED ORDER — SODIUM CHLORIDE 0.9 % IV SOLN: 250.0000 mL | INTRAVENOUS | Status: DC | PRN

## 2023-10-31 MED ORDER — ASPIRIN 81 MG PO TBEC
81.0000 mg | DELAYED_RELEASE_TABLET | Freq: Every day | ORAL | Status: DC
  Administered 2023-10-31 – 2023-11-04 (×5): 81 mg via ORAL
  Filled 2023-10-31 (×5): qty 1

## 2023-10-31 MED ORDER — TORSEMIDE 20 MG PO TABS
20.0000 mg | ORAL_TABLET | Freq: Every day | ORAL | Status: DC
  Administered 2023-10-31 – 2023-11-04 (×5): 20 mg via ORAL
  Filled 2023-10-31 (×5): qty 1

## 2023-10-31 MED ORDER — EZETIMIBE 10 MG PO TABS
10.0000 mg | ORAL_TABLET | Freq: Every day | ORAL | Status: DC
  Administered 2023-10-31 – 2023-11-04 (×5): 10 mg via ORAL
  Filled 2023-10-31 (×5): qty 1

## 2023-10-31 MED ORDER — FLUTICASONE FUROATE-VILANTEROL 200-25 MCG/ACT IN AEPB
1.0000 | INHALATION_SPRAY | Freq: Every day | RESPIRATORY_TRACT | Status: DC
Start: 2023-10-31 — End: 2023-11-04
  Administered 2023-10-31 – 2023-11-04 (×4): 1 via RESPIRATORY_TRACT
  Filled 2023-10-31 (×2): qty 28

## 2023-10-31 MED ORDER — MECLIZINE HCL 25 MG PO TABS
12.5000 mg | ORAL_TABLET | Freq: Three times a day (TID) | ORAL | Status: DC | PRN
  Administered 2023-10-31 (×2): 12.5 mg via ORAL
  Filled 2023-10-31 (×2): qty 1

## 2023-10-31 MED ORDER — NITROGLYCERIN 0.4 MG SL SUBL: 0.4000 mg | SUBLINGUAL_TABLET | SUBLINGUAL | Status: DC | PRN

## 2023-10-31 MED ORDER — PRAVASTATIN SODIUM 40 MG PO TABS
40.0000 mg | ORAL_TABLET | Freq: Every day | ORAL | Status: DC
  Administered 2023-10-31 – 2023-11-01 (×2): 40 mg via ORAL
  Filled 2023-10-31 (×2): qty 1

## 2023-10-31 MED ORDER — METHYLPREDNISOLONE SODIUM SUCC 40 MG IJ SOLR
40.0000 mg | Freq: Once | INTRAMUSCULAR | Status: DC
Start: 2023-10-31 — End: 2023-10-31

## 2023-10-31 MED ORDER — DIPHENHYDRAMINE HCL 50 MG/ML IJ SOLN: 50.0000 mg | Freq: Once | INTRAMUSCULAR | Status: DC

## 2023-10-31 MED ORDER — ACETAMINOPHEN 650 MG RE SUPP: 650.0000 mg | Freq: Four times a day (QID) | RECTAL | Status: DC | PRN

## 2023-10-31 MED ORDER — INSULIN ASPART 100 UNIT/ML IJ SOLN
0.0000 [IU] | Freq: Three times a day (TID) | INTRAMUSCULAR | Status: DC
  Administered 2023-10-31: 1 [IU] via SUBCUTANEOUS
  Administered 2023-10-31: 2 [IU] via SUBCUTANEOUS
  Administered 2023-11-01: 1 [IU] via SUBCUTANEOUS
  Administered 2023-11-01: 4 [IU] via SUBCUTANEOUS
  Administered 2023-11-02: 1 [IU] via SUBCUTANEOUS
  Administered 2023-11-02: 2 [IU] via SUBCUTANEOUS
  Administered 2023-11-02: 1 [IU] via SUBCUTANEOUS
  Administered 2023-11-03: 2 [IU] via SUBCUTANEOUS
  Administered 2023-11-03 – 2023-11-04 (×4): 1 [IU] via SUBCUTANEOUS

## 2023-10-31 MED ORDER — CLOPIDOGREL BISULFATE 75 MG PO TABS
75.0000 mg | ORAL_TABLET | Freq: Every day | ORAL | Status: DC
  Administered 2023-10-31 – 2023-11-04 (×5): 75 mg via ORAL
  Filled 2023-10-31 (×5): qty 1

## 2023-10-31 MED ORDER — ACETAMINOPHEN 325 MG PO TABS
650.0000 mg | ORAL_TABLET | Freq: Four times a day (QID) | ORAL | Status: DC | PRN
  Administered 2023-10-31 – 2023-11-03 (×3): 650 mg via ORAL
  Filled 2023-10-31 (×3): qty 2

## 2023-10-31 NOTE — Plan of Care (Signed)

## 2023-10-31 NOTE — ED Notes (Signed)
 [facility] here to transport pt to [facility]. Pt's belongings (pair of boots, [employer] lion bag of medications, green hat, eye glasses) placed in belongings bag and are being transferred with pt to [facility]

## 2023-10-31 NOTE — Progress Notes (Signed)
 Due to this patient having a defibrillator the patient will need a nurse to monitor for MRI. The nurse has been made aware this patient has an 11 am appointment.

## 2023-10-31 NOTE — Progress Notes (Signed)
 Plan of Care Note for accepted transfer   Patient: [name]. MRN: [id number]   DOA: 10/30/2023  Facility requesting transfer: [facility] ED Requesting Provider: Mike Alcon, MD Reason for transfer: Ataxia, rule out posterior CVA with AICD Facility course: [name]. is a 68 y.o. male with history of systolic CHF, ischemic cardiomyopathy, s/p AICD, CVA, CAD, DM 2, COPD, and ventricular tachycardia who comes in with concerns for shortness of breath.  I reviewed a note where patient was admitted from 3/25 until 3/28.  He has a known history of an EF of 30 to 35% and has had recurrent VT status post ICD placement.  Patient was discharged with oxygen, furosemide  40 mg, spironolactone .  He comes in today with worsening shortness of breath.    Patient to the x-ray reports that he is more here for dizziness which he describes as lightheadedness, nausea.  He reports symptoms started at 9 PM on 10/30/2023.  He reports that the symptoms are worse with movement.  He denies any precipitating factors.  He denies any neck chiropractic, neck injuries.  He denies having anything like this before.  Upon arrival to the ER, BP was 152/104 with respiratory rate of 23 and otherwise normal vital signs.  Labs revealed a CO2 of 18 with a blood glucose of 207 and BNP of 328.2 with high-sensitivity troponin I of 14.  CBC showed leukocytosis of 13.8 and hemoconcentration.  EKG showed sinus rhythm with rate of 70 with occasional PVCs and left posterior fascicular block with PVCs.  2 view chest x-ray showed enlarged cardiac silhouette. Noncontrasted head CT scan revealed atrophy and chronic small vessel ischemic change with no acute intracranial normalities.  The patient was given 2.5 mg of IV Valium, 25 mg of p.o. meclizine , 10 mg of IV Reglan, 4 mg of IV Zofran  and 250 mL IV normal saline bolus.  Dr. Murvin Arthurs was notified about the patient and recommended transfer to [facility] given the patient's AICD though it is MRI  compatible  per [employer].  Plan of care: The patient is accepted for admission to Telemetry unit, at [facility]..  The patient will be under the care and responsibility of the ER physician until arrival to [facility].  Author: Virgene Griffin, MD 10/31/2023  Check [email or web address] for on-call coverage.  Nursing staff, Please call [facility] Admits & Consults System-Wide number on Amion as soon as patient's arrival, so appropriate admitting provider can evaluate the pt.

## 2023-10-31 NOTE — Plan of Care (Signed)
  Problem: Education: Goal: Knowledge of General Education information will improve Description: Including pain rating scale, medication(s)/side effects and non-pharmacologic comfort measures Outcome: Progressing   Problem: Health Behavior/Discharge Planning: Goal: Ability to manage health-related needs will improve Outcome: Progressing   Problem: Activity: Goal: Risk for activity intolerance will decrease Outcome: Progressing   Problem: Nutrition: Goal: Adequate nutrition will be maintained Outcome: Progressing   Problem: Coping: Goal: Level of anxiety will decrease Outcome: Progressing   Problem: Elimination: Goal: Will not experience complications related to bowel motility Outcome: Progressing Goal: Will not experience complications related to urinary retention Outcome: Progressing   Problem: Pain Managment: Goal: General experience of comfort will improve and/or be controlled Outcome: Progressing   Problem: Safety: Goal: Ability to remain free from injury will improve Outcome: Progressing

## 2023-10-31 NOTE — Progress Notes (Signed)
 Implants  LEAD SPRINT QUAT SEC [id number] - [id number]  Inventory Item: LEAD SPRINT QUAT SEC [id number] Serial no.: [id number] Model/Cat no.: [id number]  Implant name: LEAD SPRINT QUAT SEC [id number]-29 - [id number] Laterality: N/A Area: Right Ventricle  Manufacturer: [employer] Date of Manufacture:   Action: Implanted Number Used: 1   Device Identifier: [id number] Device Identifier Type: GS1   LEAD CAPSURE NOVUS 5076-52CM - [id number]  Inventory Item: LEAD CAPSURE NOVUS 5076-52CM Serial no.: [id number] Model/Cat no.: [id number]  Implant name: LEAD CAPSURE NOVUS [id number] - [id number] Laterality: N/A Area: Right Atrium  Manufacturer: [employer] Date of Manufacture:   Action: Implanted Number Used: 1   Device Identifier: [id number] Device Identifier Type: GS1   ICD EVERA DR XT MRI [id number] - [id number]   Inventory Item: ICD EVERA DR XT MRI [id number] Serial no.: [id number] Model/Cat no.: [id number]  Implant name: ICD EVERA DR XT MRI [id number] - [id number] Laterality: N/A Area: Right Atrium  Manufacturer: [employer] Date of Manufacture:   Action: Implanted Number Used: 1   Device Identifier: [id number] Device Identifier Type: GS1

## 2023-10-31 NOTE — Progress Notes (Incomplete)
 STROKE TEAM PROGRESS NOTE   INTERIM HISTORY/SUBJECTIVE Still unsteady walking. PT/OT recommending acute inpatient rehab at this time. He does not have any significant weakness, but does have some coordination issues and slow RAM with the left hand.   OBJECTIVE  CBC    Component Value Date/Time   WBC 13.3 (H) 10/31/2023 0605   RBC 5.56 10/31/2023 0605   HGB 16.5 10/31/2023 0605   HCT 47.7 10/31/2023 0605   PLT 220 10/31/2023 0605   MCV 85.8 10/31/2023 0605   MCH 29.7 10/31/2023 0605   MCHC 34.6 10/31/2023 0605   RDW 15.2 10/31/2023 0605   LYMPHSABS 1.5 09/28/2023 0424   MONOABS 1.2 (H) 09/28/2023 0424   EOSABS 0.3 09/28/2023 0424   BASOSABS 0.1 09/28/2023 0424    BMET    Component Value Date/Time   NA 139 10/31/2023 0605   NA 142 07/13/2023 1520   K 4.0 10/31/2023 0605   CL 106 10/31/2023 0605   CO2 22 10/31/2023 0605   GLUCOSE 131 (H) 10/31/2023 0605   BUN 19 10/31/2023 0605   BUN 20 07/13/2023 1520   CREATININE 1.17 10/31/2023 0605   CALCIUM  9.2 10/31/2023 0605   EGFR 57 (L) 07/13/2023 1520   GFRNONAA >60 10/31/2023 0605    IMAGING past 24 hours DG Chest 2 View Result Date: 10/30/2023 CLINICAL DATA:  COPD EXAM: CHEST - 2 VIEW COMPARISON:  09/27/2023. FINDINGS: Cardiac silhouette enlarged. No evidence of pneumothorax or pleural effusion. No evidence of pulmonary edema. No osseous abnormalities identified. There is a left-sided pacer. IMPRESSION: Enlarged cardiac silhouette. Electronically Signed   By: Sydell Eva [name]   On: 10/30/2023 19:47   CT HEAD WO CONTRAST ( ) Result Date: 10/30/2023 CLINICAL DATA:  Headache, neuro deficit EXAM: CT HEAD WITHOUT CONTRAST TECHNIQUE: Contiguous axial images were obtained from the base of the skull through the vertex without intravenous contrast. RADIATION DOSE REDUCTION: This exam was performed according to the departmental dose-optimization program which includes automated exposure control, adjustment of the mA and/or kV  according to patient size and/or use of iterative reconstruction technique. COMPARISON:  02/02/2022. FINDINGS: Brain: There is periventricular white matter decreased attenuation consistent with small vessel ischemic changes. Ventricles, sulci and cisterns are prominent consistent with age related involutional changes. No acute intracranial hemorrhage, mass effect or shift. No hydrocephalus. Vascular: No hyperdense vessel or unexpected calcification. Skull: Normal. Negative for fracture or focal lesion. Sinuses/Orbits: Mucoperiosteal thickening consistent with chronic left maxillary and sphenoid sinusitis. IMPRESSION: Atrophy and chronic small vessel ischemic changes. No acute intracranial process identified. Electronically Signed   By: Sydell Eva [name]   On: 10/30/2023 19:46    There were no vitals filed for this visit.   PHYSICAL EXAM General:  Alert, well-nourished, well-developed patient in no acute distress Psych:  Mood and affect appropriate for situation CV: Regular rate and rhythm on monitor Respiratory:  Regular, unlabored respirations on room air GI: Abdomen soft and nontender   NEURO:  Mental Status: AA&Ox3, patient is able to give clear and coherent history Speech/Language: speech is without dysarthria or aphasia.  Naming, repetition, fluency, and comprehension intact.  Cranial Nerves:  II: PERRL. Visual fields full.  III, IV, VI: EOMI. Eyelids elevate symmetrically.  V: Sensation is intact to light touch and symmetrical to face.  VII: Face is symmetrical resting and smiling VIII: hearing intact to voice. IX, X: Palate elevates symmetrically. Phonation is normal.  WU:JWJXBJYN shrug 5/5. XII: tongue is midline without fasciculations. Motor: 5/5 strength to all muscle groups  tested.  Tone: is normal and bulk is normal Sensation- Intact to light touch bilaterally. Extinction absent to light touch to DSS.   Coordination: FTN intact bilaterally, HKS: no ataxia in BLE. RAM  slow on the left.  Gait- deferred  Most Recent NIH 0     ASSESSMENT/PLAN  [name] is a 68 y.o. male with history of  DM2, stroke, OSA, hyperlipidemia, hypertension, prior strokes x 3, recurrent VT status post ICD placement who presented to [facility] ED with multiple complaints including lightheadedness, vertigo, vomiting.  NIH on Admission 1  Stroke:  Punctate infarct in cerebellar vermis Etiology:  small vessel disease  Code Stroke CT head -Atrophy and chronic small vessel ischemic changes. No acute intracranial process identified. MRI   Punctate acute infarct in the cerebellar vermis  MRA  Mild basilar artery stenosis. Otherwise unremarkable head MRA. Limited MRA of the neck due to contrast timing and motion. No evidence of a high-grade stenosis of the common carotid arteries, cervical internal carotid arteries, or V2 or V3 segments of the vertebral arteries. 2D Echo LA mildly dilated, EF 30-35% ICD interrogation no A-fib LDL 79 HgbA1c 6.4 VTE prophylaxis - Lovenox  aspirin  81 mg daily prior to admission, now on aspirin  81 mg daily and clopidogrel  75 mg daily for 3 weeks and then plavix  alone. Therapy recommendations:  CIR Disposition: Pending  Hx of Stroke/TIA 10/2019- right  temporal lobe infarct, continue DAPT for CAD 12/2021 left frontal white matter infarcts, MRA head and neck negative.  EF 30 to 35%.  Discharged on DAPT and Zetia   CAD s/p Lcx stent Recurrent VT s/p ICD Home Meds: amiodarone  Continue telemetry monitoring ICD interrogation negative for afib  CHF CAD/MI status post stent EF 25 to 30% in the past This admission EF 30 to 35% VT status post ICD in place, no A-fib On Coreg , Lasix  and Entresto  and Farxiga   Hypertension Home meds:  Coreg , lasix , entresto  Stable Long-term BP goal normotensive  Hyperlipidemia Home meds none LDL 79, goal < 70 Restart Zetia  Intolerance to statin in the past Continue Zetia  at discharge  Diabetes type II  Controlled HgbA1c 6.4, goal < 7.0 CBGs SSI Recommend close follow-up with PCP for better DM control  Other Stroke Risk Factors Obesity, There is no height or weight on file to calculate BMI., BMI >/= 30 associated with increased stroke risk, recommend weight loss, diet and exercise as appropriate OSA on CPAP  Other medical issues Leukocytosis WBC 13.8--13.3  Hospital day # 0   Patient seen and examined by NP/APP with MD. MD to update note as needed.   Imogene Mana, DNP, FNP-BC [facility] Pager: [phone]  ATTENDING NOTE: I reviewed above note and agree with the assessment and plan. Pt was seen and examined.   RN at bedside.  Patient still complaining of dizziness motion.  But otherwise stable.  On exam, patient AO x 3, right eye only see hand awaiting, left eye decreased vision on the right lower quadrant but able to see hand waving.  No nystagmus, no disconjugate gaze, show symmetrical, tongue midline.  Moving all extremities symmetrically, no limb ataxia.  Gait not tested.  However per PT note, patient unsteady on gait, questionable truncal ataxia.  PT and OT recommend CIR.  Continue DAPT for 3 weeks and then Plavix  alone.  Restart Zetia .  ICD interrogation no A-fib.  Continue follow-up with cardiology as outpatient.  For detailed assessment and plan, please refer to above as I have made changes  wherever appropriate.   Neurology will sign off. Please call with questions. Pt will follow up with stroke clinic NP at [facility] in about 4 weeks. Thanks for the consult.   Consuelo Denmark, MD PhD Stroke Neurology 11/01/2023 6:00 PM    To contact Stroke Continuity provider, please refer to [email or web address]. After hours, contact General Neurology

## 2023-10-31 NOTE — Consult Note (Signed)
 NEUROLOGY CONSULT NOTE   Date of service: October 31, 2023 Patient Name: [name]. MRN:  [id number] DOB:  06-24-1956 Chief Complaint: "Unsteady gait, lightheadedness with positive orthostatics." Requesting Provider: Rosena Conradi, MD  History of Present Illness  [name]. is a 68 y.o. male with hx of DM2, stroke, OSA, hyperlipidemia, hypertension, prior strokes x 3, recurrent VT status post ICD placement who presented to [facility] ED with multiple complaints including lightheadedness, vertigo, vomiting.  The ED team at [facility] had concerns the patient is ataxic and has vertigo.  CT head without contrast was negative for any acute intracranial normalities.  CT angio of the head and neck was not obtained secondary to iodinated contrast allergy.  Patient was unable to get MRI of the brain at [facility] secondary to AICD.  ED provider at [facility] discussed case with me to transfer patient for MRI of the brain without contrast.  Patient was not felt to be a good candidate for an's exam secondary to shortness of breath.  Given patient's prior history of 3 strokes, recommended transfer to [facility] for an MRI of the brain without contrast in a.m.  Patient reports that he woke up some time in the middle of the night on 10/29/2023 with vertigo.  As soon as he got up, he got really dizzy lightheaded and had to sit down.  He reports that he is fine when he is lying down and has no vertigo.  However, as soon as he gets up, he has a combination of lightheadedness, room spinning as well as some nausea.  Since this would happen every time he eats try to sit or stand up, he came to the ED for further evaluation and workup.  LKW: 10/29/2023 Modified rankin score: 1-No significant post stroke disability and can perform usual duties with stroke symptoms IV Thrombolysis: Not offered, outside of window.  Also symptoms were less suspicious for a stroke.   EVT: Not offered, low suspicion for LVO.    NIHSS components Score:  Comment  1a Level of Conscious 0[x]  1[]  2[]  3[]      1b LOC Questions 0[]  1[x]  2[]     He thought that this was March 2025.  1c LOC Commands 0[x]  1[]  2[]       2 Best Gaze 0[x]  1[]  2[]       3 Visual 0[x]  1[]  2[]  3[]      4 Facial Palsy 0[x]  1[]  2[]  3[]      5a Motor Arm - left 0[x]  1[]  2[]  3[]  4[]  UN[]    5b Motor Arm - Right 0[x]  1[]  2[]  3[]  4[]  UN[]    6a Motor Leg - Left 0[x]  1[]  2[]  3[]  4[]  UN[]    6b Motor Leg - Right 0[x]  1[]  2[]  3[]  4[]  UN[]    7 Limb Ataxia 0[x]  1[]  2[]  UN[]      8 Sensory 0[x]  1[]  2[]  UN[]      9 Best Language 0[x]  1[]  2[]  3[]      10 Dysarthria 0[x]  1[]  2[]  UN[]      11 Extinct. and Inattention 0[x]  1[]  2[]       TOTAL: 1      ROS  Comprehensive ROS performed and pertinent positives documented in HPI   Past History   Past Medical History:  Diagnosis Date   Anginal pain (HCC)    Arthritis    CAD (coronary artery disease)    a. s/p MI with LAD and Diag stenting @ [name];  b. 06/2015 Cath: LAD 52m/d ISR, 100 RCA (ISR) w/ L->R collats,  otw mod nonobs dzs-->Med Rx; c. 08/2019 Cath: LM nl, LAD 10p/m ISR, D1 20, D2 100, RI min irregs, LCX 91m/d, OM1 100, OM2 50, RCA 100p, 70d. RPDA fills via collats from LAD. EF 25-35%-->Med Rx; d. 10/2019 NSTEMI/Cath: LCX now 100 (2.75x15 Resolute Onyx DES), otw stable compared to 08/2019.   Chronic combined systolic and diastolic CHF (congestive heart failure) (HCC)    a. 06/2015 Echo: EF 20-25%, Gr3 DD; b. 10/2019 Echo: EF 25-30%, glob HK, sev inf/infapical HK. Mod dil LA.   Dyspnea    Essential hypertension    Headache 12/23/2021   Hyperlipidemia    Hypokalemia    a. 06/2015 in setting of diuresis.   Ischemic cardiomyopathy    a. 2011 EF 45% ([name]);  b. 06/2015 Echo: EF 20-25%; c. 04/2019 Echo: EF 40-45%; d. 08/2019 LV gram: EF 25-35%; e. 10/2019 Echo: EF 25-30%.   PVC's (premature ventricular contractions)    a. 09/2019 Zio (3 days): Avg hr 70, 4 runs NSVT, 5 runs SVT, rare PACs, frequent PVCs w/ 11.8% burden.   Sleep apnea    Stroke  [facility])    Stroke/Right temporal lobe infarction [facility])    a. 10/2019 MRI brain: 1cm acute ischemic nonhemorrhagic R temporal lobe infarct. Age-related cerebral atrophy w/ moderate chronic small vessel ischemic dzs.   Type 2 diabetes mellitus with hyperglycemia (HCC) 10/08/2019    Past Surgical History:  Procedure Laterality Date   BACK SURGERY  04/2019   CARDIAC CATHETERIZATION N/A 06/10/2015   Procedure: Left Heart Cath;  Surgeon: Wenona Hamilton, MD;  Location: [facility] INVASIVE CV LAB;  Service: Cardiovascular;  Laterality: N/A;   CORONARY STENT INTERVENTION N/A 10/08/2019   Procedure: CORONARY STENT INTERVENTION;  Surgeon: Wenona Hamilton, MD;  Location: [facility] INVASIVE CV LAB;  Service: Cardiovascular;  Laterality: N/A;   CORONARY STENT PLACEMENT     ICD IMPLANT N/A 04/07/2022   Procedure: ICD IMPLANT;  Surgeon: Boyce Byes, MD;  Location: [facility] INVASIVE CV LAB;  Service: Cardiovascular;  Laterality: N/A;   LEFT HEART CATH AND CORONARY ANGIOGRAPHY N/A 10/08/2019   Procedure: LEFT HEART CATH AND CORONARY ANGIOGRAPHY poss pci;  Surgeon: Wenona Hamilton, MD;  Location: [facility] INVASIVE CV LAB;  Service: Cardiovascular;  Laterality: N/A;   RIGHT HEART CATH AND CORONARY ANGIOGRAPHY N/A 12/30/2021   Procedure: RIGHT HEART CATH AND CORONARY ANGIOGRAPHY;  Surgeon: Sammy Crisp, MD;  Location: [facility] INVASIVE CV LAB;  Service: Cardiovascular;  Laterality: N/A;   RIGHT/LEFT HEART CATH AND CORONARY ANGIOGRAPHY N/A 08/20/2019   Procedure: RIGHT/LEFT HEART CATH AND CORONARY ANGIOGRAPHY;  Surgeon: Wenona Hamilton, MD;  Location: [facility] INVASIVE CV LAB;  Service: Cardiovascular;  Laterality: N/A;    Family History: Family History  Problem Relation Age of Onset   Coronary artery disease Mother    Diabetes Mother    Coronary artery disease Father     Social History  reports that he quit smoking about 12 years ago. His smoking use included cigarettes. He has never used smokeless tobacco. He reports that  he does not drink alcohol and does not use drugs.  Allergies  Allergen Reactions   Contrast Media [Iodinated Contrast Media] Shortness Of Breath   Iohexol  Shortness Of Breath     Onset Date: [id number]    Jardiance  [Empagliflozin ] Other (See Comments)    Fatigue/weakness   Atorvastatin  Other (See Comments)    Myalgias    Hydrocodone  Itching   Morphine And Codeine Other (See Comments)    Lost control  Penicillins Other (See Comments)    Unknown reaction Did it involve swelling of the face/tongue/throat, SOB, or low BP? Unknown Did it involve sudden or severe rash/hives, skin peeling, or any reaction on the inside of your mouth or nose? Unknown Did you need to seek medical attention at a hospital or doctor's office? Yes When did it last happen?      Childhood allergy  If all above answers are "NO", may proceed with cephalosporin use.    Rosuvastatin  Other (See Comments)    Myalgias     Medications   Current Facility-Administered Medications:    0.9 %  sodium chloride  infusion, 250 mL, Intravenous, PRN, Sundil, Subrina, MD   acetaminophen  (TYLENOL ) tablet 650 mg, 650 mg, Oral, Q6H PRN **OR** acetaminophen  (TYLENOL ) suppository 650 mg, 650 mg, Rectal, Q6H PRN, Sundil, Subrina, MD   aspirin  EC tablet 81 mg, 81 mg, Oral, Daily, Sundil, Subrina, MD   carvedilol  (COREG ) tablet 3.125 mg, 3.125 mg, Oral, BID WC, Sundil, Subrina, MD   ezetimibe  (ZETIA ) tablet 10 mg, 10 mg, Oral, Daily, Sundil, Subrina, MD   fluticasone furoate-vilanterol (BREO ELLIPTA) 200-25 MCG/ACT 1 puff, 1 puff, Inhalation, Daily, Sundil, Subrina, MD   insulin  aspart (novoLOG ) injection 0-5 Units, 0-5 Units, Subcutaneous, QHS, Sundil, Subrina, MD   insulin  aspart (novoLOG ) injection 0-6 Units, 0-6 Units, Subcutaneous, TID WC, Sundil, Subrina, MD   ipratropium-albuterol  (DUONEB) 0.5-2.5 (3) MG/3ML nebulizer solution 3 mL, 3 mL, Nebulization, Q4H PRN, Sundil, Subrina, MD   meclizine  (ANTIVERT ) tablet 12.5 mg, 12.5 mg,  Oral, TID PRN, Sundil, Subrina, MD   nitroGLYCERIN  (NITROSTAT ) SL tablet 0.4 mg, 0.4 mg, Sublingual, Q5 min PRN, Sundil, Subrina, MD   prochlorperazine  (COMPAZINE ) injection 10 mg, 10 mg, Intravenous, Q6H PRN, Sundil, Subrina, MD   sodium chloride  flush (NS) 0.9 % injection 3 mL, 3 mL, Intravenous, Q12H, Sundil, Subrina, MD, 3 mL at 10/31/23 0607   sodium chloride  flush (NS) 0.9 % injection 3 mL, 3 mL, Intravenous, Q12H, Sundil, Subrina, MD, 3 mL at 10/31/23 0607   sodium chloride  flush (NS) 0.9 % injection 3 mL, 3 mL, Intravenous, PRN, Sundil, Subrina, MD   torsemide  (DEMADEX ) tablet 20 mg, 20 mg, Oral, Daily, Sundil, Subrina, MD  Vitals  There were no vitals filed for this visit.  There is no height or weight on file to calculate BMI.  Physical Exam   General: Laying comfortably in bed; in no acute distress.  HENT: Normal oropharynx and mucosa. Normal external appearance of ears and nose.  Neck: Supple, no pain or tenderness  CV: No JVD. No peripheral edema.  Pulmonary: Symmetric Chest rise. Normal respiratory effort.  Abdomen: Soft to touch, non-tender.  Ext: No cyanosis, edema, or deformity  Skin: No rash. Normal palpation of skin.   Musculoskeletal: Normal digits and nails by inspection. No clubbing.   Neurologic Examination  Mental status/Cognition: Alert, oriented to self, place, but not to month. Oriented to year, good attention.  Speech/language: Fluent, comprehension intact, object naming intact, repetition intact.  Cranial nerves:   CN II Pupils equal and reactive to light, no VF deficits    CN III,IV,VI EOM intact, no gaze preference or deviation, no nystagmus    CN V normal sensation in V1, V2, and V3 segments bilaterally    CN VII no asymmetry, no nasolabial fold flattening    CN VIII normal hearing to speech    CN IX & X normal palatal elevation, no uvular deviation    CN XI 5/5 head turn and  5/5 shoulder shrug bilaterally    CN XII midline tongue protrusion     Motor:  Muscle bulk: Normal, tone normal, pronator drift none Mvmt Root Nerve  Muscle Right Left Comments  SA C5/6 Ax Deltoid 5 5   EF C5/6 Mc Biceps 5 5   EE C6/7/8 Rad Triceps 5 5   WF C6/7 Med FCR     WE C7/8 PIN ECU     F Ab C8/T1 U ADM/FDI 5 5   HF L1/2/3 Fem Illopsoas 5 5   KE L2/3/4 Fem Quad 5 5   DF L4/5 D Peron Tib Ant 5 5   PF S1/2 Tibial Grc/Sol 5 5    Sensation:  Light touch Intact throughout   Pin prick    Temperature    Vibration   Proprioception    Coordination/Complex Motor:  - Finger to Nose intact bilaterally - Heel to shin intact bilaterally - Rapid alternating movement arm normal - Gait: Deferred for patient's safety given pain in his right hip.  Labs/Imaging/Neurodiagnostic studies   CBC:  Recent Labs  Lab 11-20-23 1759  WBC 13.8*  HGB 18.5*  HCT 52.2*  MCV 84.2  PLT 236   Basic Metabolic Panel:  Lab Results  Component Value Date   NA 135 11/20/23   K 4.1 11-20-2023   CO2 18 (L) 11-20-23   GLUCOSE 207 (H) Nov 20, 2023   BUN 17 11-20-2023   CREATININE 1.02 2023/11/20   CALCIUM  9.5 20-Nov-2023   GFRNONAA >60 11-20-2023   GFRAA >60 10/17/2019   Lipid Panel:  Lab Results  Component Value Date   LDLCALC 79 09/28/2023   HgbA1c:  Lab Results  Component Value Date   HGBA1C 6.4 (H) 09/27/2023   Urine Drug Screen:     Component Value Date/Time   LABOPIA NONE DETECTED 01/11/2022 0839   COCAINSCRNUR NONE DETECTED 01/11/2022 0839   LABBENZ NONE DETECTED 01/11/2022 0839   AMPHETMU NONE DETECTED 01/11/2022 0839   THCU NONE DETECTED 01/11/2022 0839   LABBARB NONE DETECTED 01/11/2022 0839    Alcohol Level     Component Value Date/Time   ETH <10 06/17/2021 1941   INR  Lab Results  Component Value Date   INR 1.1 02/01/2022   APTT  Lab Results  Component Value Date   APTT 27 02/01/2022   AED levels: No results found for: "PHENYTOIN", "ZONISAMIDE", "LAMOTRIGINE", "LEVETIRACETA"  CT Head without contrast(Personally  reviewed): CTH was negative for a large hypodensity concerning for a large territory infarct or hyperdensity concerning for an ICH  ASSESSMENT   Raguel Bunkers. is a 68 y.o. male with hx of DM2, stroke, OSA, hyperlipidemia, hypertension, prior strokes x 3, recurrent VT status post ICD placement who presented to [facility] ED with multiple complaints including lightheadedness, vertigo, vomiting.  On my evaluation, patient reports intermittent vertigo that is triggered by him to either try to sit or stand up.  He has no vertigo at rest.  Neuroexam is negative for spontaneous or gaze evoked nystagmus, head impulse testing attempted but failed secondary to inability to adequately relax his head.  No skew deviation noted.  Overall, presentation with intermittent vertigo as well as attempted partial HINTS plus exam is most suggestive of a peripheral vertigo. However, only able to do partial head impulse testing secondary to inability to relax his head adequately.  Therefore, I would recommend getting an MRI of the brain without contrast.  RECOMMENDATIONS  - MRI Brain w/o contrast. If negative for stroke, no further  workup. - Neurology will follow up MRI brain but we do not plan to see him again in the inpatient setting. - If MRI brain is negative, would recommend Meclizine  25mg  Q6H PRN for vertigo along with Prednisone  50mg  daily x 5 doses, then 40mg  x 1 day, followed by 30mg  x 1 day, followed by 20 mg x 1 day, followed by 10 mg x 1 day and then stop. - outpatient Vestibular PT.  ______________________________________________________________________    Signed, Roxan Copes, MD [facility]

## 2023-10-31 NOTE — ED Notes (Signed)
 Pt reports improvement in dizziness and nausea after valium administration

## 2023-10-31 NOTE — H&P (Addendum)
 History and Physical    [name]. EAV:[id number] DOB: 1956/04/15 DOA: 10/31/2023  PCP: Patient, No Pcp Per   Patient coming from: Home   Chief Complaint: Dizziness, lightheadedness, shortness of breath, vomiting, chill, difficulty concentrating  ED TRIAGE note:  Patient to ED via POV for SOB. Pt states it started last night. Also having chills, difficulty concentrating, and vomiting.  Denies fevers at home. Hx CHF      HPI:   [name]. is a 68 y.o. male with history of HFrEF 30 to 35%, ischemic cardiomyopathy, s/p AICD, CVA, CAD s/p LCx stent, COPD, V. tach s/p AICD and chronic PVC who has been initially presented to emergency department at [facility] with multiple complaint includes  dizziness, lightheadedness, ataxia shortness of breath, chill, vomiting and difficulty concentrating. Patient was admitted from 3/25 until 3/28 for CHF exacerbation..  He has a known history of an EF of 30 to 35% and has had recurrent VT status post ICD placement.  Patient was discharged with oxygen, furosemide  40 mg, spironolactone .  He comes in today with worsening shortness of breath.    Patient to the x-ray reports that he is more here for dizziness which he describes as lightheadedness, nausea.  He reports symptoms started at 9 PM on 10/30/2023.  He reports that the symptoms are worse with movement.  He denies any precipitating factors.  He denies any neck chiropractic, neck injuries.  He denies having anything like this before.   Upon arrival to the ER, BP was 152/104 with respiratory rate of 23 and otherwise normal vital signs. Normal troponin 14 and 16. Respiratory panel unremarkable. UA pending. Elevated BNP 328. CBC showing leukocytosis 13.8 stable H&H and normal platelet count. BMP showing low bicarb 18 otherwise unremarkable.  EKG showed sinus rhythm with rate of 70 with occasional PVCs and left posterior fascicular block with PVCs.   Chest x-ray no evidence of pulmonary edema.   Enlarged cardiac silhouette.  Noncontrasted head CT scan revealed atrophy and chronic small vessel ischemic change with no acute intracranial normalities.   The patient was given 2.5 mg of IV Valium, 25 mg of p.o. meclizine , 10 mg of IV Reglan, 4 mg of IV Zofran  and 250 mL IV normal saline bolus.    In the ED patient is orthostatic positive. In the ED device has been interrogated no event on the device.  ED physician also discussed with device rep stated that ICD is MRI compatible.  Due to dizziness there is concern for stroke and neurology Dr. Murvin Arthurs was notified about the patient concern for brainstem CVA which is causing dizziness/ataxia and recommended transfer to [facility] given the patient's AICD though it is MRI compatible per [employer].  Patient has been transferred [facility] to [facility] for further workup for ataxia/dizziness and rule out ischemic stroke.   Significant labs in the ED: Lab Orders         Comprehensive metabolic panel         CBC        Review of Systems:  Review of Systems  Constitutional:  Negative for malaise/fatigue.  Respiratory:  Negative for cough and shortness of breath.   Cardiovascular:  Negative for chest pain, palpitations, orthopnea and leg swelling.  Gastrointestinal:  Negative for abdominal pain, heartburn, nausea and vomiting.  Musculoskeletal:  Negative for back pain, falls, joint pain, myalgias and neck pain.  Neurological:  Positive for dizziness. Negative for tingling, tremors, sensory change, speech change, focal weakness,  seizures, loss of consciousness, weakness and headaches.       Unsteady gait  Psychiatric/Behavioral:  The patient is not nervous/anxious.     Past Medical History:  Diagnosis Date   Anginal pain (HCC)    Arthritis    CAD (coronary artery disease)    a. s/p MI with LAD and Diag stenting @ [name];  b. 06/2015 Cath: LAD 60m/d ISR, 100 RCA (ISR) w/ L->R collats, otw mod nonobs dzs-->Med Rx; c. 08/2019 Cath: LM  nl, LAD 10p/m ISR, D1 20, D2 100, RI min irregs, LCX 42m/d, OM1 100, OM2 50, RCA 100p, 70d. RPDA fills via collats from LAD. EF 25-35%-->Med Rx; d. 10/2019 NSTEMI/Cath: LCX now 100 (2.75x15 Resolute Onyx DES), otw stable compared to 08/2019.   Chronic combined systolic and diastolic CHF (congestive heart failure) (HCC)    a. 06/2015 Echo: EF 20-25%, Gr3 DD; b. 10/2019 Echo: EF 25-30%, glob HK, sev inf/infapical HK. Mod dil LA.   Dyspnea    Essential hypertension    Headache 12/23/2021   Hyperlipidemia    Hypokalemia    a. 06/2015 in setting of diuresis.   Ischemic cardiomyopathy    a. 2011 EF 45% ([name]);  b. 06/2015 Echo: EF 20-25%; c. 04/2019 Echo: EF 40-45%; d. 08/2019 LV gram: EF 25-35%; e. 10/2019 Echo: EF 25-30%.   PVC's (premature ventricular contractions)    a. 09/2019 Zio (3 days): Avg hr 70, 4 runs NSVT, 5 runs SVT, rare PACs, frequent PVCs w/ 11.8% burden.   Sleep apnea    Stroke [facility])    Stroke/Right temporal lobe infarction [facility])    a. 10/2019 MRI brain: 1cm acute ischemic nonhemorrhagic R temporal lobe infarct. Age-related cerebral atrophy w/ moderate chronic small vessel ischemic dzs.   Type 2 diabetes mellitus with hyperglycemia (HCC) 10/08/2019    Past Surgical History:  Procedure Laterality Date   BACK SURGERY  04/2019   CARDIAC CATHETERIZATION N/A 06/10/2015   Procedure: Left Heart Cath;  Surgeon: Wenona Hamilton, MD;  Location: [facility] INVASIVE CV LAB;  Service: Cardiovascular;  Laterality: N/A;   CORONARY STENT INTERVENTION N/A 10/08/2019   Procedure: CORONARY STENT INTERVENTION;  Surgeon: Wenona Hamilton, MD;  Location: [facility] INVASIVE CV LAB;  Service: Cardiovascular;  Laterality: N/A;   CORONARY STENT PLACEMENT     ICD IMPLANT N/A 04/07/2022   Procedure: ICD IMPLANT;  Surgeon: Boyce Byes, MD;  Location: [facility] INVASIVE CV LAB;  Service: Cardiovascular;  Laterality: N/A;   LEFT HEART CATH AND CORONARY ANGIOGRAPHY N/A 10/08/2019   Procedure: LEFT HEART CATH AND CORONARY  ANGIOGRAPHY poss pci;  Surgeon: Wenona Hamilton, MD;  Location: [facility] INVASIVE CV LAB;  Service: Cardiovascular;  Laterality: N/A;   RIGHT HEART CATH AND CORONARY ANGIOGRAPHY N/A 12/30/2021   Procedure: RIGHT HEART CATH AND CORONARY ANGIOGRAPHY;  Surgeon: Sammy Crisp, MD;  Location: [facility] INVASIVE CV LAB;  Service: Cardiovascular;  Laterality: N/A;   RIGHT/LEFT HEART CATH AND CORONARY ANGIOGRAPHY N/A 08/20/2019   Procedure: RIGHT/LEFT HEART CATH AND CORONARY ANGIOGRAPHY;  Surgeon: Wenona Hamilton, MD;  Location: [facility] INVASIVE CV LAB;  Service: Cardiovascular;  Laterality: N/A;     reports that he quit smoking about 12 years ago. His smoking use included cigarettes. He has never used smokeless tobacco. He reports that he does not drink alcohol and does not use drugs.  Allergies  Allergen Reactions   Contrast Media [Iodinated Contrast Media] Shortness Of Breath   Iohexol  Shortness Of Breath     Onset Date: [id number]  Jardiance  [Empagliflozin ] Other (See Comments)    Fatigue/weakness   Atorvastatin  Other (See Comments)    Myalgias    Hydrocodone  Itching   Morphine And Codeine Other (See Comments)    Lost control    Penicillins Other (See Comments)    Unknown reaction Did it involve swelling of the face/tongue/throat, SOB, or low BP? Unknown Did it involve sudden or severe rash/hives, skin peeling, or any reaction on the inside of your mouth or nose? Unknown Did you need to seek medical attention at a hospital or doctor's office? Yes When did it last happen?      Childhood allergy  If all above answers are "NO", may proceed with cephalosporin use.    Rosuvastatin  Other (See Comments)    Myalgias     Family History  Problem Relation Age of Onset   Coronary artery disease Mother    Diabetes Mother    Coronary artery disease Father     Prior to Admission medications   Medication Sig Start Date End Date Taking? Authorizing Provider  albuterol  (VENTOLIN  HFA) 108 (90 Base)  MCG/ACT inhaler Inhale 2 puffs into the lungs every 6 (six) hours as needed for wheezing or shortness of breath. 06/08/23   Marc Senior, MD  amiodarone  (PACERONE ) 200 MG tablet Take 200 mg by mouth 2 (two) times daily.    [provider]  aspirin  EC 81 MG tablet Take 1 tablet (81 mg total) by mouth daily. Swallow whole. 01/15/22   Charlette Console, FNP  carvedilol  (COREG ) 3.125 MG tablet Take 1 tablet (3.125 mg total) by mouth 2 (two) times daily with a meal. 09/30/23 11/29/23  Ree Candy, MD  dapagliflozin  propanediol (FARXIGA ) 10 MG TABS tablet Take 1 tablet (10 mg total) by mouth daily before breakfast. 10/07/23   Charlette Console, FNP  ezetimibe  (ZETIA ) 10 MG tablet Take 1 tablet (10 mg total) by mouth daily. Patient not taking: Reported on 09/27/2023 06/01/22   Wenona Hamilton, MD  furosemide  (LASIX ) 40 MG tablet Take 40 mg by mouth daily as needed for edema.    [provider]  nitroGLYCERIN  (NITROSTAT ) 0.4 MG SL tablet Place 1 tablet (0.4 mg total) under the tongue every 5 (five) minutes as needed for chest pain. 01/15/22 09/27/23  Charlette Console, FNP  potassium chloride  SA (KLOR-CON  M) 20 MEQ tablet Take 1 tablet (20 mEq total) by mouth daily. Patient not taking: Reported on 06/08/2023 09/16/22   Bensimhon, Rheta Celestine, MD  sacubitril -valsartan  (ENTRESTO ) 24-26 MG Take 1 tablet by mouth 2 (two) times daily. 09/30/23 11/29/23  Ree Candy, MD     Physical Exam: There were no vitals filed for this visit.  Physical Exam Constitutional:      Appearance: He is not ill-appearing.  HENT:     Mouth/Throat:     Mouth: Mucous membranes are moist.  Cardiovascular:     Rate and Rhythm: Normal rate and regular rhythm.     Pulses: Normal pulses.     Heart sounds: Normal heart sounds.  Pulmonary:     Effort: Pulmonary effort is normal.     Breath sounds: Normal breath sounds.  Abdominal:     Palpations: Abdomen is soft.  Musculoskeletal:     Cervical back: Neck  supple.     Right lower leg: No edema.     Left lower leg: No edema.  Skin:    General: Skin is warm.     Capillary Refill: Capillary refill takes less  than 2 seconds.  Neurological:     Mental Status: He is alert and oriented to person, place, and time.     Cranial Nerves: No cranial nerve deficit.     Sensory: No sensory deficit.     Motor: No weakness.  Psychiatric:        Mood and Affect: Mood normal.        Thought Content: Thought content normal.      Labs on Admission: I have personally reviewed following labs and imaging studies  CBC: Recent Labs  Lab 10/30/23 1759  WBC 13.8*  HGB 18.5*  HCT 52.2*  MCV 84.2  PLT 236   Basic Metabolic Panel: Recent Labs  Lab 10/30/23 1759  NA 135  K 4.1  CL 103  CO2 18*  GLUCOSE 207*  BUN 17  CREATININE 1.02  CALCIUM  9.5   GFR: Estimated Creatinine Clearance: 75.9 mL/min (by C-G formula based on SCr of 1.02 mg/dL). Liver Function Tests: No results for input(s): "AST", "ALT", "ALKPHOS", "BILITOT", "PROT", "ALBUMIN" in the last 168 hours. No results for input(s): "LIPASE", "AMYLASE" in the last 168 hours. No results for input(s): "AMMONIA" in the last 168 hours. Coagulation Profile: No results for input(s): "INR", "PROTIME" in the last 168 hours. Cardiac Enzymes: Recent Labs  Lab 10/30/23 1759 10/30/23 2339  TROPONINIHS 14 16   BNP (last 3 results) Recent Labs    07/13/23 1520 09/27/23 0956 10/30/23 1759  BNP 279.5* 603.3* 328.2*   HbA1C: No results for input(s): "HGBA1C" in the last 72 hours. CBG: No results for input(s): "GLUCAP" in the last 168 hours. Lipid Profile: No results for input(s): "CHOL", "HDL", "LDLCALC", "TRIG", "CHOLHDL", "LDLDIRECT" in the last 72 hours. Thyroid  Function Tests: No results for input(s): "TSH", "T4TOTAL", "FREET4", "T3FREE", "THYROIDAB" in the last 72 hours. Anemia Panel: No results for input(s): "VITAMINB12", "FOLATE", "FERRITIN", "TIBC", "IRON", "RETICCTPCT" in the last  72 hours. Urine analysis:    Component Value Date/Time   COLORURINE AMBER (A) 05/21/2022 2206   APPEARANCEUR HAZY (A) 05/21/2022 2206   LABSPEC 1.026 05/21/2022 2206   PHURINE 7.0 05/21/2022 2206   GLUCOSEU NEGATIVE 05/21/2022 2206   HGBUR SMALL (A) 05/21/2022 2206   BILIRUBINUR NEGATIVE 05/21/2022 2206   KETONESUR 5 (A) 05/21/2022 2206   PROTEINUR 100 (A) 05/21/2022 2206   NITRITE NEGATIVE 05/21/2022 2206   LEUKOCYTESUR NEGATIVE 05/21/2022 2206    Radiological Exams on Admission: I have personally reviewed images DG Chest 2 View Result Date: 10/30/2023 CLINICAL DATA:  COPD EXAM: CHEST - 2 VIEW COMPARISON:  09/27/2023. FINDINGS: Cardiac silhouette enlarged. No evidence of pneumothorax or pleural effusion. No evidence of pulmonary edema. No osseous abnormalities identified. There is a left-sided pacer. IMPRESSION: Enlarged cardiac silhouette. Electronically Signed   By: Sydell Eva [name]   On: 10/30/2023 19:47   CT HEAD WO CONTRAST ( ) Result Date: 10/30/2023 CLINICAL DATA:  Headache, neuro deficit EXAM: CT HEAD WITHOUT CONTRAST TECHNIQUE: Contiguous axial images were obtained from the base of the skull through the vertex without intravenous contrast. RADIATION DOSE REDUCTION: This exam was performed according to the departmental dose-optimization program which includes automated exposure control, adjustment of the mA and/or kV according to patient size and/or use of iterative reconstruction technique. COMPARISON:  02/02/2022. FINDINGS: Brain: There is periventricular white matter decreased attenuation consistent with small vessel ischemic changes. Ventricles, sulci and cisterns are prominent consistent with age related involutional changes. No acute intracranial hemorrhage, mass effect or shift. No hydrocephalus. Vascular: No hyperdense vessel or unexpected  calcification. Skull: Normal. Negative for fracture or focal lesion. Sinuses/Orbits: Mucoperiosteal thickening consistent with  chronic left maxillary and sphenoid sinusitis. IMPRESSION: Atrophy and chronic small vessel ischemic changes. No acute intracranial process identified. Electronically Signed   By: Sydell Eva [name]   On: 10/30/2023 19:46     EKG: My personal interpretation of EKG shows: EKG showing normal sinus rhythm heart rate 70 with premature ventricular complex.    Assessment/Plan: Principal Problem:   Dizziness Active Problems:   Essential hypertension   History of CVA (cerebrovascular accident)   HFrEF (heart failure with reduced ejection fraction) (HCC)   History of CAD (coronary artery disease)   Non-insulin  dependent type 2 diabetes mellitus (HCC)   History of COPD   GAD (generalized anxiety disorder)    Assessment and Plan: Dizziness and ataxia -Patient reported sudden onset of dizziness and unsteady gait for last 24 hours.  Reported dizziness while he he stands up from lying or sitting position and afterward he feels unsteady with walker.  Denies any dizziness while lying/seated position.  Reported associated shortness of breath and nausea with dizziness.   - At presentation to ED patient is hemodynamically stable. - BMP no evidence of electrolyte derangement except low bicarb 18.  CBC showing leukocytosis 13.8 which is about the same since last hospitalization 1 month ago. - Normal troponin level.  EKG showing normal sinus rhythm with premature ventricular complex.  EKG finding as similar as previous. -Chest x-ray no evidence of pulmonary vascular congestion. - Respiratory panel negative for COVID, flu RSV. -The ED patient found orthostatic positive. -ICD interrogated no no evidence of arrhythmia and firing. - [employer] ICD compatible with MRI. - CT head showing atrophy and chronic vessel ischemic changes.  No acute intra cranial process identified. - At [facility] this case has been discussed with on-call neurology Dr.Khaliqdina who is concern for brainstem stroke/posterior stroke  recommended transfer to [facility] for MRI. -Pending MRI of the brain without contrast.  Have to wait till morning given patient has an AICD. -In the ED patient has been treated with Valium, meclizine , Reglan and Zofran  and 250 mL of NS bolus.  Patient has nausea and dizziness has been improved with meclizine  and Valium. -Continue fall precaution. -Continue meclizine  as needed.  Given patient has prolonged QTc will avoid Zofran .  Continue Compazine  as needed. - Continue cardiac monitoring. -If his stroke workup is negative will consult inpatient PT vestibular therapy.  History of CVA - Continue aspirin  and ezetimibe .   Patient declined Plavix  in the past which was initially was for treatment for CAD.   HFrEF 30 to 35% Essential hypertension -Patient is complaining about dyspnea which is chronic in nature. - Elevated BNP - Reported compliant with diet and diuretics at home. - Chest x-ray no evidence of pulmonary vascular congestion -During previous hospitalization in March 2025 blood pressure regimen has been readjusted by cardiology in the setting of hypotension, fatigue and weakness.  History of intolerance to Jardiance  due to fatigue and weakness.  Entresto  was discontinued due to hypotension. - Currently at home patient is on Coreg  3.125 mg twice daily, torsemide  20 mg daily.Aaron Aas  Resumed home blood pressure regimen.  History of CAD s/p LCx stent - Continue Coreg , aspirin  and ezetimibe .  Patient has been declined Plavix  before.  History of ventricular tachycardia status post ICD History of frequent PVC - Continue Coreg .  Amiodarone  has been discontinued by cardiology given patient has an ICD placed   History of OSA -Patient declined to  use CPAP in the past.  History of generalized anxiety disorder - During last hospitalization symptom of anxiety improved with Zoloft  but patient stopped and declined to take anymore  Non-insulin -dependent DM type II -History of intolerance  to Jardiance .  Due to fatigue. - A1c 6.4 diet managed. Continue heart healthy carb modified diet and check POC blood glucose with low sliding scale insulin .  History of COPD -Continue Dulera  twice daily and DuoNeb as needed.   DVT prophylaxis:  SCDs.  Holding pharmacological DVT prophylaxis until MRI of the brain will be resulted Code Status:  Full Code Diet: Heart healthy carb modified diet Family Communication:   Family was present at bedside, at the time of interview. Opportunity was given to ask question and all questions were answered satisfactorily.  Disposition Plan: Pending MRI of the brain Consults: Neurology Admission status:   Inpatient, Telemetry bed  Severity of Illness: The appropriate patient status for this patient is INPATIENT. Inpatient status is judged to be reasonable and necessary in order to provide the required intensity of service to ensure the patient's safety. The patient's presenting symptoms, physical exam findings, and initial radiographic and laboratory data in the context of their chronic comorbidities is felt to place them at high risk for further clinical deterioration. Furthermore, it is not anticipated that the patient will be medically stable for discharge from the hospital within 2 midnights of admission.   * I certify that at the point of admission it is my clinical judgment that the patient will require inpatient hospital care spanning beyond 2 midnights from the point of admission due to high intensity of service, high risk for further deterioration and high frequency of surveillance required.Aaron Aas    Reeder Brisby, MD [facility]  How to contact the [facility] Attending or Consulting provider 7A - 7P or covering provider during after hours 7P -7A, for this patient.  Check the care team in [facility] and look for a) attending/consulting [facility] provider listed and b) the [facility] team listed Log into [email or web address] and use [place] universal password to access.  If you do not have the password, please contact the hospital operator. Locate the [facility] provider you are looking for under [facility] and page to a number that you can be directly reached. If you still have difficulty reaching the provider, please page the [facility] (Director on Call) for the Hospitalists listed on amion for assistance.  10/31/2023, 3:56 AM
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
## 2023-10-31 NOTE — ED Notes (Signed)
 ..  EMTALA: REQUIRED DOCUMENTATION COMPLETED AND REVIEWED BY WRITER PRIOR TO PT TRANSFER MD REASSESSMENT EMTALA RN SECTION TRANSFER E-SIGN VS WITHIN REQUIRED TIME

## 2023-10-31 NOTE — Progress Notes (Signed)
 Same day note  [name]. is a 68 y.o. male with history of systolic congestive heart failure with last known ejection fraction of 30 to 35%,  CVA, CAD s/p LCx stent, COPD, V. tach s/p AICD and chronic PVC who presented to hospital with multiple complaints including dizziness, lightheadedness ataxia shortness of breath chills vomiting and difficulty concentrating.  Of note patient was recently admitted between 3/25 until 3/28 for CHF exacerbation and was discharged with oxygen, furosemide  40 mg, spironolactone .  He complains of worsening shortness of breath, lightheadedness and nausea mostly with movement.  In the ED blood pressure was elevated and was tachypneic.  Troponins were normal.  Respiratory panel was unremarkable.  BNP elevated at 328.  CBC showed mild leukocytosis at 13.8.  EKG showed normal sinus rhythm.  Chest x-ray without any pulmonary edema.  CT head scan showed atrophy.  AICD interrogation was done without any events.  Patient was noted to be orthostatic in the ED.  Patient was transferred to [facility] given his AICD for further workup with dizziness and rule out stroke.  In the ED patient received 2.5 mg of IV Valium, 25 mg of p.o. meclizine , 10 mg of IV Reglan, 4 mg of IV Zofran  and 250 mL IV normal saline bolus.      Patient seen and examined at bedside.  Patient was admitted to the hospital for dizziness lightheadedness shortness of breath  At the time of my evaluation, patient complains of dizziness.  Physical examination reveals obese male, moving extremities.  Laboratory data and imaging was reviewed  Assessment and Plan.  Dizziness and ataxia Sudden for 24 hours especially on changing positions.  .  WBC slightly high but similar to last admission.  EKG with sinus rhythm.  AICD interrogated without any arrhythmia.  CT head with no acute findings neurology was consulted and MRI of the brain with acute punctate infarct in the cerebellar vermis.  Continue fall  precautions meclizine ,  PT evaluation, Check 2D echocardiogram.  Reached out to neurology regarding the MRI report.  History of CVA Continue aspirin  and ezetimibe .  . Patient declined Plavix  in the past which was initially was for treatment for CAD.     HFrEF 30 to 35% Essential hypertension Has chronic dyspnea with elevated BNP.  Reported compliance with diuretics at home.  Chest x-ray without any vascular congestion.  Intolerance to Jardiance .  On Coreg  3.125 twice daily at home including torsemide  daily.  Check 2D echocardiogram.    History of CAD s/p LCx stent  Continue Coreg , aspirin  and ezetimibe .  Patient has been declined Plavix  before.   History of ventricular tachycardia status post ICD History of frequent PVC - Continue Coreg .  Amiodarone  has been discontinued by cardiology given patient has an ICD placed.  Check 2D echocardiogram.    History of OSA Did not want CPAP in the past.   History of generalized anxiety disorder Was prescribed Zoloft  but has not taken recently.   Non-insulin -dependent DM type II -History of intolerance to Jardiance  due to fatigue.  Latest hemoglobin A1c 6.4 diet managed. Continue heart healthy carb modified diet and check POC blood glucose with low sliding scale insulin .   History of COPD -Continue Dulera  twice daily and DuoNeb as needed.  No Charge  Signed,  Lindwood Rhody, MD [facility]

## 2023-11-01 ENCOUNTER — Inpatient Hospital Stay (HOSPITAL_COMMUNITY)

## 2023-11-01 DIAGNOSIS — Z794 Long term (current) use of insulin: Secondary | ICD-10-CM

## 2023-11-01 DIAGNOSIS — R29700 NIHSS score 0: Secondary | ICD-10-CM | POA: Diagnosis not present

## 2023-11-01 DIAGNOSIS — I110 Hypertensive heart disease with heart failure: Secondary | ICD-10-CM | POA: Diagnosis not present

## 2023-11-01 DIAGNOSIS — I6389 Other cerebral infarction: Secondary | ICD-10-CM

## 2023-11-01 DIAGNOSIS — E785 Hyperlipidemia, unspecified: Secondary | ICD-10-CM

## 2023-11-01 DIAGNOSIS — I639 Cerebral infarction, unspecified: Secondary | ICD-10-CM | POA: Diagnosis not present

## 2023-11-01 DIAGNOSIS — I509 Heart failure, unspecified: Secondary | ICD-10-CM | POA: Diagnosis not present

## 2023-11-01 DIAGNOSIS — I2510 Atherosclerotic heart disease of native coronary artery without angina pectoris: Secondary | ICD-10-CM

## 2023-11-01 DIAGNOSIS — R42 Dizziness and giddiness: Secondary | ICD-10-CM | POA: Diagnosis not present

## 2023-11-01 LAB — GLUCOSE, CAPILLARY
Glucose-Capillary: 141 mg/dL — ABNORMAL HIGH (ref 70–99)
Glucose-Capillary: 167 mg/dL — ABNORMAL HIGH (ref 70–99)
Glucose-Capillary: 313 mg/dL — ABNORMAL HIGH (ref 70–99)
Glucose-Capillary: 145 mg/dL — ABNORMAL HIGH (ref 70–99)

## 2023-11-01 LAB — ECHOCARDIOGRAM COMPLETE
AR max vel: 2.62 cm2
AV Peak grad: 4.6 mmHg
Ao pk vel: 1.07 m/s
Area-P 1/2: 2.29 cm2
Height: 66 in
MV M vel: 5.04 m/s
MV Peak grad: 101.6 mmHg
MV VTI: 2.05 cm2
S' Lateral: 6 cm
Weight: 3361.57 [oz_av]

## 2023-11-01 MED ORDER — PERFLUTREN LIPID MICROSPHERE
1.0000 mL | INTRAVENOUS | Status: AC | PRN
  Administered 2023-11-01: 2 mL via INTRAVENOUS

## 2023-11-01 MED ORDER — ENOXAPARIN SODIUM 40 MG/0.4ML IJ SOSY
40.0000 mg | PREFILLED_SYRINGE | INTRAMUSCULAR | Status: DC
  Administered 2023-11-01 – 2023-11-03 (×3): 40 mg via SUBCUTANEOUS
  Filled 2023-11-01 (×3): qty 0.4

## 2023-11-01 NOTE — Evaluation (Signed)
 Physical Therapy Evaluation Patient Details Name: [name]. MRN: [id number] DOB: 23-Aug-1955 Today's Date: 11/01/2023  History of Present Illness  Patient is 68 y.o. male presented to [facility] 10/30/23 with multiple complaints including dizziness, lightheadedness ataxia shortness of breath chills vomiting and difficulty concentrating.  Pt recently admitted 3/25-3/28 for CHF and dc'd on home O2. CT of head negative, MRI of the brain revealed acute punctate infarct in the cerebellar vermis; MRA  Mild basilar artery stenosis. PMH significant for OA, CAD, HLD, HTN, PVC's, Multiple Stokes, DMII. (Patient reported that he had trouble using his L hand approximately 2 weeks ago that lasted for a short time.  He did not seek medical attention at that time.)   Clinical Impression  [name]. is 68 y.o. male admitted with above HPI and diagnosis. Patient is currently limited by functional impairments below (see PT problem list). Patient lives alone and is independent at baseline. He was able to completed bed mobility with extra time and use of bed features at mod Ind level. C/o significant dizziness with all transitional movements in bed as well as during transfers and gait. Pt required Min assist to steady balance with rise from EOB. Min HHA provided for steadying with gait to amb short bout to bathroom then back. Pt experienced 2 episodes of LOB towards Lt during ambulation requiring increased min assist. Vestibular testing completed for HINTS with Rt beating nystagmus only seen in Rt gaze for short period. Pt negative for all positional BPPV tests of posterior and horizontal canal. Given MRI result suspect pt's symptoms are less likely related to a peripheral vertigo and more likely related to central condition. Patient will benefit from continued skilled PT interventions to address impairments and progress independence with mobility. Patient will benefit from intensive inpatient follow-up therapy, >3  hours/day. Acute PT will follow and progress as able.      Orthostatic VS for the past 24 hrs:  BP- Lying Pulse- Lying BP- Sitting Pulse- Sitting BP- Standing at 0 minutes Pulse- Standing at 0 minutes BP- Standing at 3 minutes Pulse- Standing at 3 minutes  11/01/23 1039 141/83 60 147/85 66 (!) 153/107 68 (!) 142/94 61       If plan is discharge home, recommend the following: A lot of help with walking and/or transfers;A lot of help with bathing/dressing/bathroom;Assistance with cooking/housework;Help with stairs or ramp for entrance;Assist for transportation;Direct supervision/assist for medications management   Can travel by private vehicle        Equipment Recommendations Rolling walker (2 wheels)  Recommendations for Other Services  Rehab consult    Functional Status Assessment Patient has had a recent decline in their functional status and demonstrates the ability to make significant improvements in function in a reasonable and predictable amount of time.     Precautions / Restrictions Precautions Precautions: Fall Recall of Precautions/Restrictions: Intact Restrictions Weight Bearing Restrictions Per Provider Order: No       Vestibular Assessment - 11/01/23 0001       Symptom Behavior   Subjective history of current problem denies prior epidosde, denies falls. fallign towards Lt    Type of Dizziness  Diplopia;Imbalance;Spinning;Unsteady with head/body turns    Duration of Dizziness ~1 minute    Symptom Nature Positional;Motion provoked    Aggravating Factors Supine to sit;Sit to stand;Turning body quickly;Turning head sideways   sit to supine   Relieving Factors Slow movements;Head stationary    Progression of Symptoms No change since onset  Oculomotor Exam   Oculomotor Alignment Normal    Ocular ROM WNL    Spontaneous Absent    Gaze-induced  Right beating nystagmus with R gaze   slight (fatigues)   Head shaking Horizontal Comment   no nystagmus, symptom  provocking   Head Shaking Vertical Comment   less provocative than horizontal and eased up quicker   Smooth Pursuits Intact    Saccades Intact    Comment HINTS Skew negative      Positional Testing   Horizontal Canal Testing Horizontal Canal Right;Horizontal Canal Left      Horizontal Canal Right   Horizontal Canal Right Duration no nystagmus, reports momentary sensation of dizziness    Horizontal Canal Right Symptoms Other (comment)      Horizontal Canal Left   Horizontal Canal Left Symptoms Normal             Mobility  Bed Mobility Overal bed mobility: Needs Assistance Bed Mobility: Rolling, Supine to Sit, Sit to Supine Rolling: Modified independent (Device/Increase time)   Supine to sit: Modified independent (Device/Increase time) Sit to supine: Modified independent (Device/Increase time)        Transfers Overall transfer level: Needs assistance Equipment used: 1 person hand held assist Transfers: Sit to/from Stand Sit to Stand: Min assist                Ambulation/Gait Ambulation/Gait assistance: Min assist Gait Distance (Feet): 15 Feet (2x15) Assistive device: 1 person hand held assist Gait Pattern/deviations: Step-through pattern, Decreased step length - right, Decreased step length - left, Decreased stride length, Shuffle, Staggering left, Drifts right/left, Trunk flexed Gait velocity: decr        Stairs            Wheelchair Mobility     Tilt Bed    Modified Rankin (Stroke Patients Only)       Balance Overall balance assessment: Needs assistance Sitting-balance support: Feet supported, Bilateral upper extremity supported Sitting balance-Leahy Scale: Fair     Standing balance support: During functional activity, Single extremity supported, Bilateral upper extremity supported Standing balance-Leahy Scale: Poor Standing balance comment: reliant on support from therapist or external support                              Pertinent Vitals/Pain Pain Assessment Pain Assessment: Faces Faces Pain Scale: Hurts a little bit Pain Location: bil feet Pain Descriptors / Indicators: Tightness Pain Intervention(s): Limited activity within patient's tolerance, Monitored during session, Repositioned    Home Living Family/patient expects to be discharged to:: Private residence Living Arrangements: Alone Available Help at Discharge: Family Type of Home: House Home Access: Stairs to enter   Secretary/administrator of Steps: 2   Home Layout: One level Home Equipment: None Additional Comments: family (son) close by    Prior Function Prior Level of Function : Independent/Modified Independent;Working/employed;Driving             Mobility Comments: 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, lives alone, 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 ADLs Comments: independent     Extremity/Trunk Assessment   Upper Extremity Assessment Upper Extremity Assessment: Overall WFL for tasks assessed    Lower Extremity Assessment Lower Extremity Assessment: Defer to PT evaluation RLE Deficits / Details: grossly 4/5 throughout RLE Sensation: decreased light touch (?peripheral neuropathy) LLE Deficits / Details: grossly 4-/5 with greater weakness at dorsiflexion due to pain 3+/5 LLE Sensation: decreased light touch (?peripheral neuropathy)    Cervical /  Trunk Assessment Cervical / Trunk Assessment: Normal  Communication   Communication Communication: No apparent difficulties    Cognition Arousal: Alert Behavior During Therapy: WFL for tasks assessed/performed   PT - Cognitive impairments: No apparent impairments, Orientation   Orientation impairments: Time                   PT - Cognition Comments: pt unable to state date, month, year, or day of week. states "I don't keep up with that". pt unaware of any conversations with MD about his test results. Following commands: Intact       Cueing Cueing Techniques: Verbal cues      General Comments General comments (skin integrity, edema, etc.): No family present    Exercises     Assessment/Plan    PT Assessment Patient needs continued PT services  PT Problem List Decreased strength;Decreased range of motion;Decreased activity tolerance;Decreased balance;Decreased mobility;Decreased coordination;Decreased cognition;Decreased knowledge of use of DME;Decreased safety awareness;Decreased knowledge of precautions;Impaired sensation;Obesity       PT Treatment Interventions DME instruction;Patient/family education;Cognitive remediation;Neuromuscular re-education;Balance training;Therapeutic exercise;Therapeutic activities;Functional mobility training;Stair training;Gait training    PT Goals (Current goals can be found in the Care Plan section)  Acute Rehab PT Goals Patient Stated Goal: return home and get better PT Goal Formulation: With patient Time For Goal Achievement: 11/15/23 Potential to Achieve Goals: Good    Frequency Min 3X/week     Co-evaluation               AM-PAC PT "6 Clicks" Mobility  Outcome Measure Help needed turning from your back to your side while in a flat bed without using bedrails?: A Little Help needed moving from lying on your back to sitting on the side of a flat bed without using bedrails?: A Little Help needed moving to and from a bed to a chair (including a wheelchair)?: A Little Help needed standing up from a chair using your arms (e.g., wheelchair or bedside chair)?: A Little Help needed to walk in hospital room?: A Little Help needed climbing 3-5 steps with a railing? : Total 6 Click Score: 16    End of Session Equipment Utilized During Treatment: Gait belt Activity Tolerance: Patient tolerated treatment well Patient left: in bed;with call bell/phone within reach;with bed alarm set Nurse Communication: Mobility status PT Visit Diagnosis: Other abnormalities of gait and mobility (R26.89);Muscle weakness (generalized)  (M62.81);Difficulty in walking, not elsewhere classified (R26.2);Other symptoms and signs involving the nervous system (R29.898)    Time: 4098-1191 PT Time Calculation (min) (ACUTE ONLY): 57 min   Charges:   PT Evaluation $PT Eval Moderate Complexity: 1 Mod PT Treatments $Gait Training: 8-22 mins $Therapeutic Activity: 8-22 mins $Physical Performance Test: 8-22 mins PT General Charges $$ ACUTE PT VISIT: 1 Visit         Tish Forge, DPT Acute Rehabilitation Services Office [phone]  11/01/23 1:59 PM

## 2023-11-01 NOTE — Progress Notes (Signed)
  Inpatient Rehab Admissions Coordinator :  Per therapy recommendations, patient was screened for CIR candidacy by Ottie Glazier RN MSN.  At this time patient appears to be a potential candidate for CIR. I will place a rehab consult per protocol for full assessment. Please call me with any questions.  Ottie Glazier RN MSN Admissions Coordinator [phone]

## 2023-11-01 NOTE — Progress Notes (Signed)
 Heart Failure Navigator Progress Note  Assessed for Heart & Vascular TOC clinic readiness.  Patient does not meet criteria due to Advanced Heart Failure Team patient of Dr. Gasper Lloyd.   Navigator will sign off at this time.   Rhae Hammock, BSN, Scientist, clinical (histocompatibility and immunogenetics) Only

## 2023-11-01 NOTE — Progress Notes (Addendum)
 PROGRESS NOTE  [name]. YQM:[id number] DOB: 08/27/1955 DOA: 10/31/2023 PCP: Patient, No Pcp Per   LOS: 1 day   Brief narrative:   [name]. is a 67 y.o. male with history of systolic congestive heart failure with last known ejection fraction of 30 to 35%,  CVA, CAD s/p LCx stent, COPD, V. tach s/p AICD and chronic PVC who presented to hospital with multiple complaints including dizziness, lightheadedness ataxia shortness of breath chills vomiting and difficulty concentrating.  Of note patient was recently admitted between 3/25 until 3/28 for CHF exacerbation and was discharged with oxygen, furosemide  40 mg, spironolactone .  He complains of worsening shortness of breath, lightheadedness and nausea mostly with movement.  In the ED blood pressure was elevated and was tachypneic.  Troponins were normal.  Respiratory panel was unremarkable.  BNP elevated at 328.  CBC showed mild leukocytosis at 13.8.  EKG showed normal sinus rhythm.  Chest x-ray without any pulmonary edema.  CT head scan showed atrophy.  AICD interrogation was done without any events.  Patient was noted to be orthostatic in the ED.  Patient was transferred to [facility] given his AICD for further workup with dizziness and rule out stroke.        Assessment/Plan: Principal Problem:   Dizziness Active Problems:   Essential hypertension   History of CVA (cerebrovascular accident)   HFrEF (heart failure with reduced ejection fraction) (HCC)   History of CAD (coronary artery disease)   Non-insulin  dependent type 2 diabetes mellitus (HCC)   History of COPD   GAD (generalized anxiety disorder)  Dizziness and ataxia Sudden onset for 24 hours especially on changing positions.  Most likely orthostatic.  WBC slightly high but similar to last admission.  EKG with sinus rhythm.  AICD interrogated without any arrhythmia.  CT head with no acute findings.  Neurology was consulted and patient underwent MRI of the brain  including MRA of the head and neck.  MRI of the brain showed punctuate infarct in the cerebellar vermis.  Neurology on board at this time and recommend aspirin  and Plavix .  2D echocardiogram pending.  PT OT evaluation as recommended CIR at this time.  History of CVA Continue aspirin  and ezetimibe .  . Patient declined Plavix  in the past which was initially was for treatment for CAD.    HFrEF 30 to 35% Essential hypertension Has chronic dyspnea with elevated BNP.  Reported compliance with diuretics at home.  Chest x-ray without any vascular congestion.  Intolerance to Jardiance .  Continue Coreg  3.125 twice daily at home including torsemide  daily.  Group therapy 2D echocardiogram with LV ejection fraction of 30 to 35%.   History of CAD s/p LCx stent  Continue Coreg , aspirin  and ezetimibe .     History of ventricular tachycardia status post ICD History of frequent PVC - Continue Coreg .  Amiodarone  has been discontinued by cardiology given patient has an ICD placed.  Check 2D echocardiogram pending.Aaron Aas    History of OSA Class I obesity.Body mass index is 33.91 kg/m.  Did not want CPAP in the past.  Would benefit from ongoing weight loss as outpatient.   History of generalized anxiety disorder Was prescribed Zoloft  but has not taken recently.   Non-insulin -dependent DM type II -History of intolerance to Jardiance  due to fatigue.  Latest hemoglobin A1c 6.4 diet managed. Continue heart healthy carb modified diet and check POC blood glucose with low sliding scale insulin .   History of COPD -Continue Dulera  twice daily  and DuoNeb as needed.  DVT prophylaxis: SCDs Start: 10/31/23 0210 Place TED hose Start: 10/31/23 0210   Disposition: CIR as per PT evaluation  Status is: Inpatient Remains inpatient appropriate because: Need for rehabilitation, status post stroke    Code Status:     Code Status: Full Code  Family Communication: Not at  bedside  Consultants: Neurology  Procedures: None  Anti-infectives:  None  Anti-infectives (From admission, onward)    None        Subjective: Today, patient was seen and examined at bedside.  Patient states that he feels okay while lying down but has not stood up.  Denies any nausea vomiting chest pain shortness of breath or dyspnea.  Objective: Vitals:   11/01/23 0724 11/01/23 1208  BP: 125/87 137/74  Pulse: 65 (!) 59  Resp: 17 16  Temp: 98 F (36.7 C) 97.6 F (36.4 C)  SpO2: 92% 94%    Intake/Output Summary (Last 24 hours) at 11/01/2023 1428 Last data filed at 10/31/2023 2112 Gross per 24 hour  Intake 360 ml  Output 1025 ml  Net -665 ml   Filed Weights   10/31/23 1100  Weight: 95.3 kg   Body mass index is 33.91 kg/m.   Physical Exam: GENERAL: Patient is alert awake and oriented. Not in obvious distress.  Obese. HENT: No scleral pallor or icterus. Pupils equally reactive to light. Oral mucosa is moist NECK: is supple, no gross swelling noted. CHEST: Clear to auscultation. No crackles or wheezes.   CVS: S1 and S2 heard, no murmur. Regular rate and rhythm.  ABDOMEN: Soft, non-tender, bowel sounds are present. EXTREMITIES: No edema. CNS: Cranial nerves are intact. No focal motor deficits. SKIN: warm and dry without rashes.  Data Review: I have personally reviewed the following laboratory data and studies,  CBC: Recent Labs  Lab 10/30/23 1759 10/31/23 0605  WBC 13.8* 13.3*  HGB 18.5* 16.5  HCT 52.2* 47.7  MCV 84.2 85.8  PLT 236 220   Basic Metabolic Panel: Recent Labs  Lab 10/30/23 1759 10/31/23 0605  NA 135 139  K 4.1 4.0  CL 103 106  CO2 18* 22  GLUCOSE 207* 131*  BUN 17 19  CREATININE 1.02 1.17  CALCIUM  9.5 9.2   Liver Function Tests: Recent Labs  Lab 10/31/23 0605  AST 16  ALT 11  ALKPHOS 61  BILITOT 1.4*  PROT 6.7  ALBUMIN 3.4*   No results for input(s): "LIPASE", "AMYLASE" in the last 168 hours. No results for  input(s): "AMMONIA" in the last 168 hours. Cardiac Enzymes: No results for input(s): "CKTOTAL", "CKMB", "CKMBINDEX", "TROPONINI" in the last 168 hours. BNP (last 3 results) Recent Labs    07/13/23 1520 09/27/23 0956 10/30/23 1759  BNP 279.5* 603.3* 328.2*    ProBNP (last 3 results) No results for input(s): "PROBNP" in the last 8760 hours.  CBG: Recent Labs  Lab 10/31/23 1259 10/31/23 1629 10/31/23 2111 11/01/23 0619 11/01/23 1209  GLUCAP 226* 183* 164* 141* 167*   Recent Results (from the past 240 hours)  Resp panel by RT-PCR (RSV, Flu A&B, Covid) Anterior Nasal Swab     Status: None   Collection Time: 10/30/23  6:39 PM   Specimen: Anterior Nasal Swab  Result Value Ref Range Status   SARS Coronavirus 2 by RT PCR NEGATIVE NEGATIVE Final    Comment: (NOTE) SARS-CoV-2 target nucleic acids are NOT DETECTED.  The SARS-CoV-2 RNA is generally detectable in upper respiratory specimens during the acute phase of infection. The  lowest concentration of SARS-CoV-2 viral copies this assay can detect is 138 copies/mL. A negative result does not preclude SARS-Cov-2 infection and should not be used as the sole basis for treatment or other patient management decisions. A negative result may occur with  improper specimen collection/handling, submission of specimen other than nasopharyngeal swab, presence of viral mutation(s) within the areas targeted by this assay, and inadequate number of viral copies(<138 copies/mL). A negative result must be combined with clinical observations, patient history, and epidemiological information. The expected result is Negative.  Fact Sheet for Patients:  BloggerCourse.com  Fact Sheet for Healthcare Providers:  SeriousBroker.it  This test is no t yet approved or cleared by the [place]  FDA and  has been authorized for detection and/or diagnosis of SARS-CoV-2 by FDA under an Emergency Use  Authorization (EUA). This EUA will remain  in effect (meaning this test can be used) for the duration of the COVID-19 declaration under Section 564(b)(1) of the Act, 21 U.S.C.section 360bbb-3(b)(1), unless the authorization is terminated  or revoked sooner.       Influenza A by PCR NEGATIVE NEGATIVE Final   Influenza B by PCR NEGATIVE NEGATIVE Final    Comment: (NOTE) The Xpert Xpress SARS-CoV-2/FLU/RSV plus assay is intended as an aid in the diagnosis of influenza from Nasopharyngeal swab specimens and should not be used as a sole basis for treatment. Nasal washings and aspirates are unacceptable for Xpert Xpress SARS-CoV-2/FLU/RSV testing.  Fact Sheet for Patients: BloggerCourse.com  Fact Sheet for Healthcare Providers: SeriousBroker.it  This test is not yet approved or cleared by the [place]  FDA and has been authorized for detection and/or diagnosis of SARS-CoV-2 by FDA under an Emergency Use Authorization (EUA). This EUA will remain in effect (meaning this test can be used) for the duration of the COVID-19 declaration under Section 564(b)(1) of the Act, 21 U.S.C. section 360bbb-3(b)(1), unless the authorization is terminated or revoked.     Resp Syncytial Virus by PCR NEGATIVE NEGATIVE Final    Comment: (NOTE) Fact Sheet for Patients: BloggerCourse.com  Fact Sheet for Healthcare Providers: SeriousBroker.it  This test is not yet approved or cleared by the [place]  FDA and has been authorized for detection and/or diagnosis of SARS-CoV-2 by FDA under an Emergency Use Authorization (EUA). This EUA will remain in effect (meaning this test can be used) for the duration of the COVID-19 declaration under Section 564(b)(1) of the Act, 21 U.S.C. section 360bbb-3(b)(1), unless the authorization is terminated or revoked.  Performed at [facility], [address] [place], [place] [address]      Studies: ECHOCARDIOGRAM COMPLETE Result Date: 11/01/2023    ECHOCARDIOGRAM REPORT   Patient Name:   Orlandis Bares. Date of Exam: 11/01/2023 Medical Rec #:  [id number]         Height:       66.0 in Accession #:    [id number]        Weight:       210.1 lb Date of Birth:  15-Apr-1956          BSA:          2.042 m Patient Age:    68 years          BP:           125/87 mmHg Patient Gender: M                 HR:           66  bpm. Exam Location:  Inpatient Procedure: 2D Echo, Color Doppler, Cardiac Doppler and Intracardiac            Opacification Agent (Both Spectral and Color Flow Doppler were            utilized during procedure). Indications:    Stroke  History:        Patient has prior history of Echocardiogram examinations.                 Cardiomyopathy and CHF, CAD, Stroke, Arrythmias:Pacemaker; Risk                 Factors:Former Smoker, Sleep Apnea and Diabetes.  Sonographer:    Willey Harrier Referring Phys: [id number] JINDONG XU IMPRESSIONS  1. There is no left ventricular thrombus (Definity  contrast was used). Left ventricular ejection fraction, by estimation, is 30 to 35%. The left ventricle has moderately decreased function. The left ventricle demonstrates regional wall motion abnormalities (see scoring diagram/findings for description). The left ventricular internal cavity size was moderately dilated. Left ventricular diastolic parameters are consistent with Grade I diastolic dysfunction (impaired relaxation). There is akinesis of the left ventricular, basal-mid inferior wall and inferolateral wall. There is moderate hypokinesis of the left ventricular, entire apical segment.  2. Right ventricular systolic function is mildly reduced. The right ventricular size is normal.  3. Left atrial size was mildly dilated.  4. Right atrial size was mildly dilated.  5. The mitral valve is normal in structure. Mild to moderate mitral valve regurgitation. No evidence of mitral  stenosis.  6. The aortic valve is tricuspid. There is mild calcification of the aortic valve. Aortic valve regurgitation is not visualized. Aortic valve sclerosis/calcification is present, without any evidence of aortic stenosis. Comparison(s): Prior images reviewed side by side. The left ventricular function is unchanged. The left ventricular diastolic function has improved. The left ventricular wall motion abnormalities are unchanged. Left heart filling pressures are no longer elevated and mitral insufficiency appears less severe. FINDINGS  Left Ventricle: There is no left ventricular thrombus (Definity  contrast was used). Left ventricular ejection fraction, by estimation, is 30 to 35%. The left ventricle has moderately decreased function. The left ventricle demonstrates regional wall motion abnormalities. Moderate hypokinesis of the left ventricular, entire apical segment. The left ventricular internal cavity size was moderately dilated. There is no left ventricular hypertrophy. Left ventricular diastolic parameters are consistent with Grade I diastolic dysfunction (impaired relaxation). Normal left ventricular filling pressure.  LV Wall Scoring: The inferior wall, posterior wall, and basal anterolateral segment are akinetic. The apical septal segment, apical anterior segment, apical inferior segment, and apex are hypokinetic. The anterior wall, anterior septum, apical lateral segment, mid anterolateral segment, mid inferoseptal segment, and basal inferoseptal segment are normal. Right Ventricle: The right ventricular size is normal. No increase in right ventricular wall thickness. Right ventricular systolic function is mildly reduced. Left Atrium: Left atrial size was mildly dilated. Right Atrium: Right atrial size was mildly dilated. Pericardium: There is no evidence of pericardial effusion. Mitral Valve: The mitral valve is normal in structure. Mild to moderate mitral valve regurgitation, with  centrally-directed jet. No evidence of mitral valve stenosis. MV peak gradient, 4.4 mmHg. The mean mitral valve gradient is 1.0 mmHg. Tricuspid Valve: The tricuspid valve is normal in structure. Tricuspid valve regurgitation is not demonstrated. Aortic Valve: The aortic valve is tricuspid. There is mild calcification of the aortic valve. Aortic valve regurgitation is not visualized. Aortic valve sclerosis/calcification is present, without any evidence of  aortic stenosis. Aortic valve peak gradient measures 4.6 mmHg. Pulmonic Valve: The pulmonic valve was grossly normal. Pulmonic valve regurgitation is not visualized. No evidence of pulmonic stenosis. Aorta: The aortic root and ascending aorta are structurally normal, with no evidence of dilitation. IAS/Shunts: No atrial level shunt detected by color flow Doppler. Additional Comments: A device lead is visualized in the right ventricle and right atrium.  LEFT VENTRICLE PLAX 2D LVIDd:         6.50 cm   Diastology LVIDs:         6.00 cm   LV e' medial:    2.63 cm/s LV PW:         0.70 cm   LV E/e' medial:  13.3 LV IVS:        0.90 cm   LV e' lateral:   3.29 cm/s LVOT diam:     2.00 cm   LV E/e' lateral: 10.7 LV SV:         52 LV SV Index:   26 LVOT Area:     3.14 cm  RIGHT VENTRICLE             IVC RV Basal diam:  4.00 cm     IVC diam: 1.60 cm RV S prime:     10.50 cm/s TAPSE (M-mode): 1.7 cm LEFT ATRIUM             Index        RIGHT ATRIUM           Index LA Vol (A2C):   68.6 ml 33.59 ml/m  RA Area:     19.30 cm LA Vol (A4C):   48.1 ml 23.55 ml/m  RA Volume:   62.30 ml  30.51 ml/m LA Biplane Vol: 59.5 ml 29.14 ml/m  AORTIC VALVE AV Area (Vmax): 2.62 cm AV Vmax:        107.00 cm/s AV Peak Grad:   4.6 mmHg LVOT Vmax:      89.40 cm/s LVOT Vmean:     59.100 cm/s LVOT VTI:       0.166 m  AORTA Ao Root diam: 3.50 cm Ao Asc diam:  3.40 cm MITRAL VALVE MV Area (PHT): 2.29 cm    SHUNTS MV Area VTI:   2.05 cm    Systemic VTI:  0.17 m MV Peak grad:  4.4 mmHg    Systemic  Diam: 2.00 cm MV Mean grad:  1.0 mmHg MV Vmax:       1.05 m/s MV Vmean:      53.3 cm/s MV Decel Time: 331 msec MR Peak grad: 101.6 mmHg MR Mean grad: 63.0 mmHg MR Vmax:      504.00 cm/s MR Vmean:     373.0 cm/s MV E velocity: 35.10 cm/s MV A velocity: 99.00 cm/s MV E/A ratio:  0.35 Mihai Croitoru MD Electronically signed by Luana Rumple MD Signature Date/Time: 11/01/2023/11:38:30 AM    Final    MR BRAIN WO CONTRAST Result Date: 10/31/2023 CLINICAL DATA:  Stroke, follow up.  Dizziness. EXAM: MRI HEAD WITHOUT CONTRAST MRA HEAD WITHOUT CONTRAST MRA NECK WITHOUT AND WITH CONTRAST TECHNIQUE: Multiplanar, multi-echo pulse sequences of the brain and surrounding structures were acquired without intravenous contrast. Angiographic images of the Circle of Willis were acquired using MRA technique without intravenous contrast. Angiographic images of the neck were acquired using MRA technique without and with intravenous contrast. Carotid stenosis measurements (when applicable) are obtained utilizing NASCET criteria, using the distal internal carotid diameter as the denominator.  CONTRAST:  9mL GADAVIST  GADOBUTROL  1 MMOL/ML IV SOLN COMPARISON:  CT head 10/30/2023. MRI head, MRA head, and MRA neck 12/26/2021. FINDINGS: MRI HEAD FINDINGS Brain: There is a punctate acute infarct in the cerebellar vermis (series 5, image 70). Scattered chronic cerebral and cerebellar microhemorrhages have mildly increased in number from the prior MRI and may reflect the sequelae of chronic hypertension. Patchy to confluent T2 hyperintensities in the cerebral white matter bilaterally are similar to the prior MRI and are nonspecific but compatible with moderate to severe chronic small vessel ischemic disease. Chronic lacunar infarcts are again seen in the cerebral white matter bilaterally, right basal ganglia, left thalamus, and both cerebellar hemispheres. There is mild cerebral atrophy. No mass, midline shift, or extra-axial fluid collection is  identified. Vascular: Major intracranial arterial flow voids are preserved. Skull and upper cervical spine: Unremarkable bone marrow signal. Sinuses/Orbits: Right cataract extraction. 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 Clear mastoid air cells. Other: None. MRA HEAD FINDINGS Anterior circulation: The internal carotid arteries are widely patent from skull base to carotid termini. ACAs and MCAs are patent without evidence of a proximal branch occlusion or significant proximal stenosis. No aneurysm is identified. Posterior circulation: The included portions of the intracranial vertebral arteries are widely patent to the basilar with the left being slightly dominant. Patent left PICA, right AICA, and bilateral SCA origins are visualized. The basilar artery is patent with a mild stenosis in its midportion. There are left larger than right posterior communicating arteries. Both PCAs are patent without evidence of a high-grade proximal stenosis. No aneurysm is identified. Anatomic variants: None. MRA NECK FINDINGS Evaluation is limited by contrast timing and motion artifact. Aortic arch: Normal variant aortic arch branching pattern with the left vertebral artery arising directly from the arch. Right carotid system: Patent without evidence of a significant stenosis within study limitations. Left carotid system: Patent without evidence of a significant stenosis within study limitations. Vertebral arteries: Patent with antegrade flow bilaterally. Nondiagnostic assessment for stenosis in the V1 segments due to study limitations. No evidence of a high-grade stenosis in the V2 or V3 segments allowing for intermittent motion artifact. IMPRESSION: 1. Punctate acute infarct in the cerebellar vermis. 2. Moderate to severe chronic small vessel ischemic disease. 3. Mild basilar artery stenosis. Otherwise unremarkable head MRA. 4. Limited MRA of the neck due to contrast timing and motion. No evidence of a high-grade  stenosis of the common carotid arteries, cervical internal carotid arteries, or V2 or V3 segments of the vertebral arteries. Electronically Signed   By: Aundra Lee [name]   On: 10/31/2023 13:27   MR ANGIO HEAD WO CONTRAST Result Date: 10/31/2023 CLINICAL DATA:  Stroke, follow up.  Dizziness. EXAM: MRI HEAD WITHOUT CONTRAST MRA HEAD WITHOUT CONTRAST MRA NECK WITHOUT AND WITH CONTRAST TECHNIQUE: Multiplanar, multi-echo pulse sequences of the brain and surrounding structures were acquired without intravenous contrast. Angiographic images of the Circle of Willis were acquired using MRA technique without intravenous contrast. Angiographic images of the neck were acquired using MRA technique without and with intravenous contrast. Carotid stenosis measurements (when applicable) are obtained utilizing NASCET criteria, using the distal internal carotid diameter as the denominator. CONTRAST:  9mL GADAVIST  GADOBUTROL  1 MMOL/ML IV SOLN COMPARISON:  CT head 10/30/2023. MRI head, MRA head, and MRA neck 12/26/2021. FINDINGS: MRI HEAD FINDINGS Brain: There is a punctate acute infarct in the cerebellar vermis (series 5, image 70). Scattered chronic cerebral and cerebellar microhemorrhages have mildly increased in number from the prior MRI  and may reflect the sequelae of chronic hypertension. Patchy to confluent T2 hyperintensities in the cerebral white matter bilaterally are similar to the prior MRI and are nonspecific but compatible with moderate to severe chronic small vessel ischemic disease. Chronic lacunar infarcts are again seen in the cerebral white matter bilaterally, right basal ganglia, left thalamus, and both cerebellar hemispheres. There is mild cerebral atrophy. No mass, midline shift, or extra-axial fluid collection is identified. Vascular: Major intracranial arterial flow voids are preserved. Skull and upper cervical spine: Unremarkable bone marrow signal. Sinuses/Orbits: Right cataract extraction. Chronic left  sphenoid and left maxillary sinusitis. Clear mastoid air cells. Other: None. MRA HEAD FINDINGS Anterior circulation: The internal carotid arteries are widely patent from skull base to carotid termini. ACAs and MCAs are patent without evidence of a proximal branch occlusion or significant proximal stenosis. No aneurysm is identified. Posterior circulation: The included portions of the intracranial vertebral arteries are widely patent to the basilar with the left being slightly dominant. Patent left PICA, right AICA, and bilateral SCA origins are visualized. The basilar artery is patent with a mild stenosis in its midportion. There are left larger than right posterior communicating arteries. Both PCAs are patent without evidence of a high-grade proximal stenosis. No aneurysm is identified. Anatomic variants: None. MRA NECK FINDINGS Evaluation is limited by contrast timing and motion artifact. Aortic arch: Normal variant aortic arch branching pattern with the left vertebral artery arising directly from the arch. Right carotid system: Patent without evidence of a significant stenosis within study limitations. Left carotid system: Patent without evidence of a significant stenosis within study limitations. Vertebral arteries: Patent with antegrade flow bilaterally. Nondiagnostic assessment for stenosis in the V1 segments due to study limitations. No evidence of a high-grade stenosis in the V2 or V3 segments allowing for intermittent motion artifact. IMPRESSION: 1. Punctate acute infarct in the cerebellar vermis. 2. Moderate to severe chronic small vessel ischemic disease. 3. Mild basilar artery stenosis. Otherwise unremarkable head MRA. 4. Limited MRA of the neck due to contrast timing and motion. No evidence of a high-grade stenosis of the common carotid arteries, cervical internal carotid arteries, or V2 or V3 segments of the vertebral arteries. Electronically Signed   By: Aundra Lee [name]   On: 10/31/2023 13:27   MR  ANGIO NECK W WO CONTRAST Result Date: 10/31/2023 CLINICAL DATA:  Stroke, follow up.  Dizziness. EXAM: MRI HEAD WITHOUT CONTRAST MRA HEAD WITHOUT CONTRAST MRA NECK WITHOUT AND WITH CONTRAST TECHNIQUE: Multiplanar, multi-echo pulse sequences of the brain and surrounding structures were acquired without intravenous contrast. Angiographic images of the Circle of Willis were acquired using MRA technique without intravenous contrast. Angiographic images of the neck were acquired using MRA technique without and with intravenous contrast. Carotid stenosis measurements (when applicable) are obtained utilizing NASCET criteria, using the distal internal carotid diameter as the denominator. CONTRAST:  9mL GADAVIST  GADOBUTROL  1 MMOL/ML IV SOLN COMPARISON:  CT head 10/30/2023. MRI head, MRA head, and MRA neck 12/26/2021. FINDINGS: MRI HEAD FINDINGS Brain: There is a punctate acute infarct in the cerebellar vermis (series 5, image 70). Scattered chronic cerebral and cerebellar microhemorrhages have mildly increased in number from the prior MRI and may reflect the sequelae of chronic hypertension. Patchy to confluent T2 hyperintensities in the cerebral white matter bilaterally are similar to the prior MRI and are nonspecific but compatible with moderate to severe chronic small vessel ischemic disease. Chronic lacunar infarcts are again seen in the cerebral white matter bilaterally, right basal ganglia, left  thalamus, and both cerebellar hemispheres. There is mild cerebral atrophy. No mass, midline shift, or extra-axial fluid collection is identified. Vascular: Major intracranial arterial flow voids are preserved. Skull and upper cervical spine: Unremarkable bone marrow signal. Sinuses/Orbits: Right cataract extraction. 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 Clear mastoid air cells. Other: None. MRA HEAD FINDINGS Anterior circulation: The internal carotid arteries are widely patent from skull base to carotid termini.  ACAs and MCAs are patent without evidence of a proximal branch occlusion or significant proximal stenosis. No aneurysm is identified. Posterior circulation: The included portions of the intracranial vertebral arteries are widely patent to the basilar with the left being slightly dominant. Patent left PICA, right AICA, and bilateral SCA origins are visualized. The basilar artery is patent with a mild stenosis in its midportion. There are left larger than right posterior communicating arteries. Both PCAs are patent without evidence of a high-grade proximal stenosis. No aneurysm is identified. Anatomic variants: None. MRA NECK FINDINGS Evaluation is limited by contrast timing and motion artifact. Aortic arch: Normal variant aortic arch branching pattern with the left vertebral artery arising directly from the arch. Right carotid system: Patent without evidence of a significant stenosis within study limitations. Left carotid system: Patent without evidence of a significant stenosis within study limitations. Vertebral arteries: Patent with antegrade flow bilaterally. Nondiagnostic assessment for stenosis in the V1 segments due to study limitations. No evidence of a high-grade stenosis in the V2 or V3 segments allowing for intermittent motion artifact. IMPRESSION: 1. Punctate acute infarct in the cerebellar vermis. 2. Moderate to severe chronic small vessel ischemic disease. 3. Mild basilar artery stenosis. Otherwise unremarkable head MRA. 4. Limited MRA of the neck due to contrast timing and motion. No evidence of a high-grade stenosis of the common carotid arteries, cervical internal carotid arteries, or V2 or V3 segments of the vertebral arteries. Electronically Signed   By: Aundra Lee [name]   On: 10/31/2023 13:27   DG Chest 2 View Result Date: 10/30/2023 CLINICAL DATA:  COPD EXAM: CHEST - 2 VIEW COMPARISON:  09/27/2023. FINDINGS: Cardiac silhouette enlarged. No evidence of pneumothorax or pleural effusion. No  evidence of pulmonary edema. No osseous abnormalities identified. There is a left-sided pacer. IMPRESSION: Enlarged cardiac silhouette. Electronically Signed   By: Sydell Eva [name]   On: 10/30/2023 19:47   CT HEAD WO CONTRAST ( ) Result Date: 10/30/2023 CLINICAL DATA:  Headache, neuro deficit EXAM: CT HEAD WITHOUT CONTRAST TECHNIQUE: Contiguous axial images were obtained from the base of the skull through the vertex without intravenous contrast. RADIATION DOSE REDUCTION: This exam was performed according to the departmental dose-optimization program which includes automated exposure control, adjustment of the mA and/or kV according to patient size and/or use of iterative reconstruction technique. COMPARISON:  02/02/2022. FINDINGS: Brain: There is periventricular white matter decreased attenuation consistent with small vessel ischemic changes. Ventricles, sulci and cisterns are prominent consistent with age related involutional changes. No acute intracranial hemorrhage, mass effect or shift. No hydrocephalus. Vascular: No hyperdense vessel or unexpected calcification. Skull: Normal. Negative for fracture or focal lesion. Sinuses/Orbits: Mucoperiosteal thickening consistent with chronic left maxillary and sphenoid sinusitis. IMPRESSION: Atrophy and chronic small vessel ischemic changes. No acute intracranial process identified. Electronically Signed   By: Sydell Eva [name]   On: 10/30/2023 19:46      Rosena Conradi, MD  [facility] 11/01/2023  If 7PM-7AM, please contact night-coverage

## 2023-11-01 NOTE — Evaluation (Signed)
 Occupational Therapy Evaluation Patient Details Name: [name]. MRN: [id number] DOB: [birth year] Today's Date: 11/01/2023   History of Present Illness   Patient is 68 y.o. male presented to [facility] 10/30/23 with multiple complaints including dizziness, lightheadedness ataxia shortness of breath chills vomiting and difficulty concentrating.  Pt recently admitted 3/25-3/28 for CHF and dc'd on home O2. CT of head negative, MRI of the brain revealed acute punctate infarct in the cerebellar vermis; MRA  Mild basilar artery stenosis. PMH significant for OA, CAD, HLD, HTN, PVC's, Multiple Stokes, DMII. (Patient reported that he had trouble using his L hand approximately 2 weeks ago that lasted for a short time.  He did not seek medical attention at that time.)     Clinical Impressions Prior to admission patient was independent with adls/iadls, working and driving.  Patient currently presents as supervision to mod A with adls due to significant dizziness with head/eye movement and transitional movements.  Testing did not indicate BPPV, but rather gaze instability which was noted during VOR testing. Patient is unstable in both sitting and standing.  Patient will benefit from intensive inpatient follow-up therapy, >3 hours/day and continued OT on acute to facilitate d/c     If plan is discharge home, recommend the following:   A little help with walking and/or transfers;A little help with bathing/dressing/bathroom;Assistance with cooking/housework;Direct supervision/assist for medications management;Assist for transportation;Help with stairs or ramp for entrance     Functional Status Assessment   Patient has had a recent decline in their functional status and demonstrates the ability to make significant improvements in function in a reasonable and predictable amount of time.     Equipment Recommendations   Tub/shower seat     Recommendations for Other Services   Rehab consult      Precautions/Restrictions   Precautions Precautions: Fall Recall of Precautions/Restrictions: Intact Restrictions Weight Bearing Restrictions Per Provider Order: No     Mobility Bed Mobility Overal bed mobility: Needs Assistance Bed Mobility: Supine to Sit, Sit to Supine     Supine to sit: Modified independent (Device/Increase time), HOB elevated Sit to supine: Modified independent (Device/Increase time), HOB elevated        Transfers Overall transfer level: Needs assistance Equipment used: Rolling walker (2 wheels) Transfers: Sit to/from Stand Sit to Stand: Contact guard assist                  Balance Overall balance assessment: Needs assistance Sitting-balance support: Bilateral upper extremity supported, Feet supported Sitting balance-Leahy Scale: Fair Sitting balance - Comments: increased use of BUE for support at EOB                                   ADL either performed or assessed with clinical judgement   ADL Overall ADL's : Needs assistance/impaired Eating/Feeding: Independent   Grooming: Contact guard assist;Standing   Upper Body Bathing: Minimal assistance;Sitting   Lower Body Bathing: Moderate assistance;Sit to/from stand   Upper Body Dressing : Contact guard assist;Sitting   Lower Body Dressing: Moderate assistance;Sit to/from stand   Toilet Transfer: Minimal assistance           Functional mobility during ADLs: Minimal assistance;Rolling walker (2 wheels) General ADL Comments: Performs transitional movements with increased time to manage dizziness level     Vision Baseline Vision/History: 1 Wears glasses Ability to See in Adequate Light: 0 Adequate Patient Visual Report: Eye fatigue/eye pain/headache;Nausea/blurring vision with  head movement Vision Assessment?: Yes - needs further testing however Eye Alignment: Within Functional Limits Additional Comments: Patient performed slow horizontal VOR with increased  dizziness to 5/10.  Required ~ 1 minute to recover to 0-1/10.  Able to perform vertical VOR with no increase in symptoms.  Patient also with motion sensitivity from supine to sit; sit to stand; stand to sit and sit to supine.     Perception         Praxis         Pertinent Vitals/Pain Pain Assessment Pain Assessment: No/denies pain     Extremity/Trunk Assessment Upper Extremity Assessment Upper Extremity Assessment: Overall WFL for tasks assessed   Lower Extremity Assessment Lower Extremity Assessment: Defer to PT evaluation   Cervical / Trunk Assessment Cervical / Trunk Assessment: Normal   Communication Communication Communication: No apparent difficulties   Cognition Arousal: Alert Behavior During Therapy: WFL for tasks assessed/performed Cognition: No apparent impairments                               Following commands: Intact       Cueing  General Comments   Cueing Techniques: Verbal cues  No family present   Exercises     Shoulder Instructions      Home Living Family/patient expects to be discharged to:: Private residence Living Arrangements: Alone Available Help at Discharge: Family Type of Home: House Home Access: Stairs to enter Secretary/administrator of Steps: 2   Home Layout: One level     Bathroom Shower/Tub: Producer, television/film/video: Standard Bathroom Accessibility: Yes   Home Equipment: None   Additional Comments: family (son) close by      Prior Functioning/Environment Prior Level of Function : Independent/Modified Independent;Working/employed;Driving             Mobility Comments: 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, lives alone, 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 ADLs Comments: independent    OT Problem List: Decreased activity tolerance;Impaired balance (sitting and/or standing);Impaired vision/perception;Decreased knowledge of use of DME or AE   OT Treatment/Interventions: Self-care/ADL training;Therapeutic  exercise;DME and/or AE instruction;Therapeutic activities;Visual/perceptual remediation/compensation;Patient/family education;Balance training      OT Goals(Current goals can be found in the care plan section)   Acute Rehab OT Goals Patient Stated Goal: wants to feel better OT Goal Formulation: With patient Time For Goal Achievement: 11/15/23 Potential to Achieve Goals: Good   OT Frequency:  Min 2X/week    Co-evaluation              AM-PAC OT "6 Clicks" Daily Activity     Outcome Measure Help from another person eating meals?: None Help from another person taking care of personal grooming?: A Little Help from another person toileting, which includes using toliet, bedpan, or urinal?: A Little Help from another person bathing (including washing, rinsing, drying)?: A Little Help from another person to put on and taking off regular upper body clothing?: A Little Help from another person to put on and taking off regular lower body clothing?: A Little 6 Click Score: 19   End of Session Equipment Utilized During Treatment: Rolling walker (2 wheels);Gait belt Nurse Communication: Mobility status  Activity Tolerance: Patient tolerated treatment well Patient left: in bed;with call bell/phone within reach;with bed alarm set  OT Visit Diagnosis: Unsteadiness on feet (R26.81);Dizziness and giddiness (R42);Muscle weakness (generalized) (M62.81)                Time:  4540-9811 OT Time Calculation (min): 28 min Charges:  OT General Charges $OT Visit: 1 Visit OT Evaluation $OT Eval Moderate Complexity: 1 Mod OT Treatments $Self Care/Home Management : 8-22 mins  Chantal Comment OTR/L   Clementina Cutter 11/01/2023, 12:39 PM

## 2023-11-02 DIAGNOSIS — R42 Dizziness and giddiness: Secondary | ICD-10-CM | POA: Diagnosis not present

## 2023-11-02 LAB — GLUCOSE, CAPILLARY
Glucose-Capillary: 173 mg/dL — ABNORMAL HIGH (ref 70–99)
Glucose-Capillary: 167 mg/dL — ABNORMAL HIGH (ref 70–99)
Glucose-Capillary: 195 mg/dL — ABNORMAL HIGH (ref 70–99)
Glucose-Capillary: 274 mg/dL — ABNORMAL HIGH (ref 70–99)

## 2023-11-02 LAB — BASIC METABOLIC PANEL WITH GFR
Anion gap: 11 (ref 5–15)
BUN: 24 mg/dL — ABNORMAL HIGH (ref 8–23)
CO2: 22 mmol/L (ref 22–32)
Calcium: 8.7 mg/dL — ABNORMAL LOW (ref 8.9–10.3)
Chloride: 102 mmol/L (ref 98–111)
Creatinine, Ser: 1.27 mg/dL — ABNORMAL HIGH (ref 0.61–1.24)
GFR, Estimated: >60 mL/min (ref >60)
Glucose, Bld: 171 mg/dL — ABNORMAL HIGH (ref 70–99)
Potassium: 3.6 mmol/L (ref 3.5–5.1)
Sodium: 135 mmol/L (ref 135–145)

## 2023-11-02 LAB — TROPONIN I (HIGH SENSITIVITY): Troponin I (High Sensitivity): 16 ng/L (ref <18)

## 2023-11-02 LAB — CBC
HCT: 48.6 % (ref 39.0–52.0)
Hemoglobin: 16.8 g/dL (ref 13.0–17.0)
MCH: 29.5 pg (ref 26.0–34.0)
MCHC: 34.6 g/dL (ref 30.0–36.0)
MCV: 85.3 fL (ref 80.0–100.0)
Platelets: 197 10*3/uL (ref 150–400)
RBC: 5.7 MIL/uL (ref 4.22–5.81)
RDW: 14.6 % (ref 11.5–15.5)
WBC: 11.5 10*3/uL — ABNORMAL HIGH (ref 4.0–10.5)
nRBC: 0 % (ref 0.0–0.2)

## 2023-11-02 LAB — MAGNESIUM: Magnesium: 2 mg/dL (ref 1.7–2.4)

## 2023-11-02 NOTE — Progress Notes (Signed)
 PROGRESS NOTE  [name]. UEA:[id number] DOB: 09/21/1955 DOA: 10/31/2023 PCP: Patient, No Pcp Per   LOS: 2 days   Brief narrative:   [name]. is a 68 y.o. male with history of systolic congestive heart failure with last known ejection fraction of 30 to 35%,  CVA, CAD s/p LCx stent, COPD, V. tach s/p AICD and chronic PVC who presented to hospital with multiple complaints including dizziness, lightheadedness ataxia shortness of breath chills vomiting and difficulty concentrating.  Of note patient was recently admitted between 3/25 until 3/28 for CHF exacerbation and was discharged with oxygen, furosemide  40 mg, spironolactone .  He complains of worsening shortness of breath, lightheadedness and nausea mostly with movement.  In the ED blood pressure was elevated and was tachypneic.  Troponins were normal.  Respiratory panel was unremarkable.  BNP elevated at 328.  CBC showed mild leukocytosis at 13.8.  EKG showed normal sinus rhythm.  Chest x-ray without any pulmonary edema.  CT head scan showed atrophy.  AICD interrogation was done without any events.  Patient was noted to be orthostatic in the ED.  Patient was transferred to [facility] given his AICD for further workup with dizziness and rule out stroke.        Assessment/Plan: Principal Problem:   Dizziness Active Problems:   Essential hypertension   History of CVA (cerebrovascular accident)   HFrEF (heart failure with reduced ejection fraction) (HCC)   History of CAD (coronary artery disease)   Non-insulin  dependent type 2 diabetes mellitus (HCC)   History of COPD   GAD (generalized anxiety disorder)  Dizziness and ataxia Sudden onset for 24 hours especially on changing positions.  Most likely orthostatic.  WBC slightly high but similar to last admission.  EKG with sinus rhythm.  AICD interrogated without any arrhythmia.  CT head with no acute findings.  Neurology was consulted and patient underwent MRI of the brain  including MRA of the head and neck.  MRI of the brain showed punctuate infarct in the cerebellar vermis.  Neurology on board at this time and recommend aspirin  and Plavix .  2D echocardiogram pending.  PT OT evaluation as recommended CIR at this time.  Chest discomfort today.  Complains of central chest discomfort.  Had been having some nausea and he was today.  Could be secondary to GI symptoms.  Will add MiraLAX .  Check EKG and troponin x1.  Magnesium  of 2.0.  Potassium 3.6 today.  History of CVA Continue aspirin  and ezetimibe .  . Patient declined Plavix  in the past which was initially was for treatment for CAD.    HFrEF 30 to 35% Essential hypertension Has chronic dyspnea with elevated BNP.  Reported compliance with diuretics at home.  Chest x-ray without any vascular congestion.  Intolerance to Jardiance .  Continue Coreg  3.125 twice daily at home including torsemide  daily.   2D echocardiogram with LV ejection fraction of 30 to 35%.  Complains of mild chest discomfort today.  Has been admitted with headache so will avoid nitrates.   History of CAD s/p LCx stent  Continue Coreg , aspirin  and ezetimibe .     History of ventricular tachycardia status post ICD History of frequent PVC - Continue Coreg .  Amiodarone  has been discontinued by cardiology given patient has an ICD placed.  Check 2D echocardiogram with LV ejection fraction of 30 to 35% with grade 1 diastolic dysfunction.  Moderate hypokinesis of the left ventricular apical segment..    History of OSA Class I obesity.Body mass  index is 33.91 kg/m.  Did not want CPAP in the past.  Would benefit from ongoing weight loss as outpatient.   History of generalized anxiety disorder Was prescribed Zoloft  but has not taken recently.   Non-insulin -dependent DM type II -History of intolerance to Jardiance  due to fatigue.  Latest hemoglobin A1c 6.4 diet managed. Continue heart healthy carb modified diet and check POC blood glucose with low sliding  scale insulin .   History of COPD -Continue Dulera  twice daily and DuoNeb as needed.  DVT prophylaxis: enoxaparin  (LOVENOX ) injection 40 mg Start: 11/01/23 1845 SCDs Start: 10/31/23 0210 Place TED hose Start: 10/31/23 0210   Disposition: CIR as per PT evaluation  Status is: Inpatient  Remains inpatient appropriate because: Need for rehabilitation, status post stroke, new chest discomfort.    Code Status:     Code Status: Full Code  Family Communication: Not at bedside  Consultants: Neurology  Procedures: None  Anti-infectives:  None  Anti-infectives (From admission, onward)    None       Subjective: Today, patient was seen and examined at bedside.  Denies any dizziness vertigo but complains of chest discomfort with some shortness of breath.  Has been having some nausea and dry heaves.  Complains of mild headache as well.  Objective: Vitals:   11/02/23 0746 11/02/23 1006  BP: (!) 150/84 (!) 146/85  Pulse: (!) 59 62  Resp: 20   Temp: (!) 97.4 F (36.3 C) (!) 97.5 F (36.4 C)  SpO2: 94% 91%    Intake/Output Summary (Last 24 hours) at 11/02/2023 1045 Last data filed at 11/01/2023 1757 Gross per 24 hour  Intake --  Output 400 ml  Net -400 ml   Filed Weights   10/31/23 1100  Weight: 95.3 kg   Body mass index is 33.91 kg/m.   Physical Exam: GENERAL: Patient is alert awake and oriented. Not in obvious distress.  Obese. HENT: No scleral pallor or icterus. Pupils equally reactive to light. Oral mucosa is moist NECK: is supple, no gross swelling noted. CHEST: Clear to auscultation. No crackles or wheezes.   CVS: S1 and S2 heard, no murmur. Regular rate and rhythm.  ABDOMEN: Soft, non-tender, bowel sounds are present. EXTREMITIES: No edema. CNS: Cranial nerves are intact. No focal motor deficits. SKIN: warm and dry without rashes.  Data Review: I have personally reviewed the following laboratory data and studies,  CBC: Recent Labs  Lab  2023-11-16 1759 10/31/23 0605 11/02/23 0544  WBC 13.8* 13.3* 11.5*  HGB 18.5* 16.5 16.8  HCT 52.2* 47.7 48.6  MCV 84.2 85.8 85.3  PLT 236 220 197   Basic Metabolic Panel: Recent Labs  Lab Nov 16, 2023 1759 10/31/23 0605 11/02/23 0544  NA 135 139 135  K 4.1 4.0 3.6  CL 103 106 102  CO2 18* 22 22  GLUCOSE 207* 131* 171*  BUN 17 19 24*  CREATININE 1.02 1.17 1.27*  CALCIUM  9.5 9.2 8.7*  MG  --   --  2.0   Liver Function Tests: Recent Labs  Lab 10/31/23 0605  AST 16  ALT 11  ALKPHOS 61  BILITOT 1.4*  PROT 6.7  ALBUMIN 3.4*   No results for input(s): "LIPASE", "AMYLASE" in the last 168 hours. No results for input(s): "AMMONIA" in the last 168 hours. Cardiac Enzymes: No results for input(s): "CKTOTAL", "CKMB", "CKMBINDEX", "TROPONINI" in the last 168 hours. BNP (last 3 results) Recent Labs    07/13/23 1520 09/27/23 0956 Nov 16, 2023 1759  BNP 279.5* 603.3* 328.2*  ProBNP (last 3 results) No results for input(s): "PROBNP" in the last 8760 hours.  CBG: Recent Labs  Lab 11/01/23 0619 11/01/23 1209 11/01/23 1608 11/01/23 2108 11/02/23 0620  GLUCAP 141* 167* 313* 145* 173*   Recent Results (from the past 240 hours)  Resp panel by RT-PCR (RSV, Flu A&B, Covid) Anterior Nasal Swab     Status: None   Collection Time: 10/30/23  6:39 PM   Specimen: Anterior Nasal Swab  Result Value Ref Range Status   SARS Coronavirus 2 by RT PCR NEGATIVE NEGATIVE Final    Comment: (NOTE) SARS-CoV-2 target nucleic acids are NOT DETECTED.  The SARS-CoV-2 RNA is generally detectable in upper respiratory specimens during the acute phase of infection. The lowest concentration of SARS-CoV-2 viral copies this assay can detect is 138 copies/mL. A negative result does not preclude SARS-Cov-2 infection and should not be used as the sole basis for treatment or other patient management decisions. A negative result may occur with  improper specimen collection/handling, submission of specimen  other than nasopharyngeal swab, presence of viral mutation(s) within the areas targeted by this assay, and inadequate number of viral copies(<138 copies/mL). A negative result must be combined with clinical observations, patient history, and epidemiological information. The expected result is Negative.  Fact Sheet for Patients:  BloggerCourse.com  Fact Sheet for Healthcare Providers:  SeriousBroker.it  This test is no t yet approved or cleared by the [place]  FDA and  has been authorized for detection and/or diagnosis of SARS-CoV-2 by FDA under an Emergency Use Authorization (EUA). This EUA will remain  in effect (meaning this test can be used) for the duration of the COVID-19 declaration under Section 564(b)(1) of the Act, 21 U.S.C.section 360bbb-3(b)(1), unless the authorization is terminated  or revoked sooner.       Influenza A by PCR NEGATIVE NEGATIVE Final   Influenza B by PCR NEGATIVE NEGATIVE Final    Comment: (NOTE) The Xpert Xpress SARS-CoV-2/FLU/RSV plus assay is intended as an aid in the diagnosis of influenza from Nasopharyngeal swab specimens and should not be used as a sole basis for treatment. Nasal washings and aspirates are unacceptable for Xpert Xpress SARS-CoV-2/FLU/RSV testing.  Fact Sheet for Patients: BloggerCourse.com  Fact Sheet for Healthcare Providers: SeriousBroker.it  This test is not yet approved or cleared by the [place]  FDA and has been authorized for detection and/or diagnosis of SARS-CoV-2 by FDA under an Emergency Use Authorization (EUA). This EUA will remain in effect (meaning this test can be used) for the duration of the COVID-19 declaration under Section 564(b)(1) of the Act, 21 U.S.C. section 360bbb-3(b)(1), unless the authorization is terminated or revoked.     Resp Syncytial Virus by PCR NEGATIVE NEGATIVE Final     Comment: (NOTE) Fact Sheet for Patients: BloggerCourse.com  Fact Sheet for Healthcare Providers: SeriousBroker.it  This test is not yet approved or cleared by the [place]  FDA and has been authorized for detection and/or diagnosis of SARS-CoV-2 by FDA under an Emergency Use Authorization (EUA). This EUA will remain in effect (meaning this test can be used) for the duration of the COVID-19 declaration under Section 564(b)(1) of the Act, 21 U.S.C. section 360bbb-3(b)(1), unless the authorization is terminated or revoked.  Performed at [facility], [address] [place] [place], [place] [address]      Studies: ECHOCARDIOGRAM COMPLETE Result Date: 11/01/2023    ECHOCARDIOGRAM REPORT   Patient Name:   [name]. Date of Exam: 11/01/2023 Medical Rec #:  [id number]  Height:       66.0 in Accession #:    [id number]        Weight:       210.1 lb Date of Birth:  [birth year]          BSA:          2.042 m Patient Age:    68 years          BP:           125/87 mmHg Patient Gender: M                 HR:           66 bpm. Exam Location:  Inpatient Procedure: 2D Echo, Color Doppler, Cardiac Doppler and Intracardiac            Opacification Agent (Both Spectral and Color Flow Doppler were            utilized during procedure). Indications:    Stroke  History:        Patient has prior history of Echocardiogram examinations.                 Cardiomyopathy and CHF, CAD, Stroke, Arrythmias:Pacemaker; Risk                 Factors:Former Smoker, Sleep Apnea and Diabetes.  Sonographer:    Willey Harrier Referring Phys: [id number] JINDONG XU IMPRESSIONS  1. There is no left ventricular thrombus (Definity  contrast was used). Left ventricular ejection fraction, by estimation, is 30 to 35%. The left ventricle has moderately decreased function. The left ventricle demonstrates regional wall motion abnormalities (see scoring diagram/findings for description).  The left ventricular internal cavity size was moderately dilated. Left ventricular diastolic parameters are consistent with Grade I diastolic dysfunction (impaired relaxation). There is akinesis of the left ventricular, basal-mid inferior wall and inferolateral wall. There is moderate hypokinesis of the left ventricular, entire apical segment.  2. Right ventricular systolic function is mildly reduced. The right ventricular size is normal.  3. Left atrial size was mildly dilated.  4. Right atrial size was mildly dilated.  5. The mitral valve is normal in structure. Mild to moderate mitral valve regurgitation. No evidence of mitral stenosis.  6. The aortic valve is tricuspid. There is mild calcification of the aortic valve. Aortic valve regurgitation is not visualized. Aortic valve sclerosis/calcification is present, without any evidence of aortic stenosis. Comparison(s): Prior images reviewed side by side. The left ventricular function is unchanged. The left ventricular diastolic function has improved. The left ventricular wall motion abnormalities are unchanged. Left heart filling pressures are no longer elevated and mitral insufficiency appears less severe. FINDINGS  Left Ventricle: There is no left ventricular thrombus (Definity  contrast was used). Left ventricular ejection fraction, by estimation, is 30 to 35%. The left ventricle has moderately decreased function. The left ventricle demonstrates regional wall motion abnormalities. Moderate hypokinesis of the left ventricular, entire apical segment. The left ventricular internal cavity size was moderately dilated. There is no left ventricular hypertrophy. Left ventricular diastolic parameters are consistent with Grade I diastolic dysfunction (impaired relaxation). Normal left ventricular filling pressure.  LV Wall Scoring: The inferior wall, posterior wall, and basal anterolateral segment are akinetic. The apical septal segment, apical anterior segment, apical  inferior segment, and apex are hypokinetic. The anterior wall, anterior septum, apical lateral segment, mid anterolateral segment, mid inferoseptal segment, and basal inferoseptal segment are normal. Right Ventricle: The right ventricular size is normal. No increase in  right ventricular wall thickness. Right ventricular systolic function is mildly reduced. Left Atrium: Left atrial size was mildly dilated. Right Atrium: Right atrial size was mildly dilated. Pericardium: There is no evidence of pericardial effusion. Mitral Valve: The mitral valve is normal in structure. Mild to moderate mitral valve regurgitation, with centrally-directed jet. No evidence of mitral valve stenosis. MV peak gradient, 4.4 mmHg. The mean mitral valve gradient is 1.0 mmHg. Tricuspid Valve: The tricuspid valve is normal in structure. Tricuspid valve regurgitation is not demonstrated. Aortic Valve: The aortic valve is tricuspid. There is mild calcification of the aortic valve. Aortic valve regurgitation is not visualized. Aortic valve sclerosis/calcification is present, without any evidence of aortic stenosis. Aortic valve peak gradient measures 4.6 mmHg. Pulmonic Valve: The pulmonic valve was grossly normal. Pulmonic valve regurgitation is not visualized. No evidence of pulmonic stenosis. Aorta: The aortic root and ascending aorta are structurally normal, with no evidence of dilitation. IAS/Shunts: No atrial level shunt detected by color flow Doppler. Additional Comments: A device lead is visualized in the right ventricle and right atrium.  LEFT VENTRICLE PLAX 2D LVIDd:         6.50 cm   Diastology LVIDs:         6.00 cm   LV e' medial:    2.63 cm/s LV PW:         0.70 cm   LV E/e' medial:  13.3 LV IVS:        0.90 cm   LV e' lateral:   3.29 cm/s LVOT diam:     2.00 cm   LV E/e' lateral: 10.7 LV SV:         52 LV SV Index:   26 LVOT Area:     3.14 cm  RIGHT VENTRICLE             IVC RV Basal diam:  4.00 cm     IVC diam: 1.60 cm RV S prime:      10.50 cm/s TAPSE (M-mode): 1.7 cm LEFT ATRIUM             Index        RIGHT ATRIUM           Index LA Vol (A2C):   68.6 ml 33.59 ml/m  RA Area:     19.30 cm LA Vol (A4C):   48.1 ml 23.55 ml/m  RA Volume:   62.30 ml  30.51 ml/m LA Biplane Vol: 59.5 ml 29.14 ml/m  AORTIC VALVE AV Area (Vmax): 2.62 cm AV Vmax:        107.00 cm/s AV Peak Grad:   4.6 mmHg LVOT Vmax:      89.40 cm/s LVOT Vmean:     59.100 cm/s LVOT VTI:       0.166 m  AORTA Ao Root diam: 3.50 cm Ao Asc diam:  3.40 cm MITRAL VALVE MV Area (PHT): 2.29 cm    SHUNTS MV Area VTI:   2.05 cm    Systemic VTI:  0.17 m MV Peak grad:  4.4 mmHg    Systemic Diam: 2.00 cm MV Mean grad:  1.0 mmHg MV Vmax:       1.05 m/s MV Vmean:      53.3 cm/s MV Decel Time: 331 msec MR Peak grad: 101.6 mmHg MR Mean grad: 63.0 mmHg MR Vmax:      504.00 cm/s MR Vmean:     373.0 cm/s MV E velocity: 35.10 cm/s MV A velocity: 99.00 cm/s MV E/A ratio:  0.35 Luana Rumple MD Electronically signed by Luana Rumple MD Signature Date/Time: 11/01/2023/11:38:30 AM    Final    MR BRAIN WO CONTRAST Result Date: 10/31/2023 CLINICAL DATA:  Stroke, follow up.  Dizziness. EXAM: MRI HEAD WITHOUT CONTRAST MRA HEAD WITHOUT CONTRAST MRA NECK WITHOUT AND WITH CONTRAST TECHNIQUE: Multiplanar, multi-echo pulse sequences of the brain and surrounding structures were acquired without intravenous contrast. Angiographic images of the Circle of Willis were acquired using MRA technique without intravenous contrast. Angiographic images of the neck were acquired using MRA technique without and with intravenous contrast. Carotid stenosis measurements (when applicable) are obtained utilizing NASCET criteria, using the distal internal carotid diameter as the denominator. CONTRAST:  9mL GADAVIST  GADOBUTROL  1 MMOL/ML IV SOLN COMPARISON:  CT head 10/30/2023. MRI head, MRA head, and MRA neck 12/26/2021. FINDINGS: MRI HEAD FINDINGS Brain: There is a punctate acute infarct in the cerebellar vermis (series 5,  image 70). Scattered chronic cerebral and cerebellar microhemorrhages have mildly increased in number from the prior MRI and may reflect the sequelae of chronic hypertension. Patchy to confluent T2 hyperintensities in the cerebral white matter bilaterally are similar to the prior MRI and are nonspecific but compatible with moderate to severe chronic small vessel ischemic disease. Chronic lacunar infarcts are again seen in the cerebral white matter bilaterally, right basal ganglia, left thalamus, and both cerebellar hemispheres. There is mild cerebral atrophy. No mass, midline shift, or extra-axial fluid collection is identified. Vascular: Major intracranial arterial flow voids are preserved. Skull and upper cervical spine: Unremarkable bone marrow signal. Sinuses/Orbits: Right cataract extraction. 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 Clear mastoid air cells. Other: None. MRA HEAD FINDINGS Anterior circulation: The internal carotid arteries are widely patent from skull base to carotid termini. ACAs and MCAs are patent without evidence of a proximal branch occlusion or significant proximal stenosis. No aneurysm is identified. Posterior circulation: The included portions of the intracranial vertebral arteries are widely patent to the basilar with the left being slightly dominant. Patent left PICA, right AICA, and bilateral SCA origins are visualized. The basilar artery is patent with a mild stenosis in its midportion. There are left larger than right posterior communicating arteries. Both PCAs are patent without evidence of a high-grade proximal stenosis. No aneurysm is identified. Anatomic variants: None. MRA NECK FINDINGS Evaluation is limited by contrast timing and motion artifact. Aortic arch: Normal variant aortic arch branching pattern with the left vertebral artery arising directly from the arch. Right carotid system: Patent without evidence of a significant stenosis within study limitations. Left  carotid system: Patent without evidence of a significant stenosis within study limitations. Vertebral arteries: Patent with antegrade flow bilaterally. Nondiagnostic assessment for stenosis in the V1 segments due to study limitations. No evidence of a high-grade stenosis in the V2 or V3 segments allowing for intermittent motion artifact. IMPRESSION: 1. Punctate acute infarct in the cerebellar vermis. 2. Moderate to severe chronic small vessel ischemic disease. 3. Mild basilar artery stenosis. Otherwise unremarkable head MRA. 4. Limited MRA of the neck due to contrast timing and motion. No evidence of a high-grade stenosis of the common carotid arteries, cervical internal carotid arteries, or V2 or V3 segments of the vertebral arteries. Electronically Signed   By: Aundra Lee [name]   On: 10/31/2023 13:27   MR ANGIO HEAD WO CONTRAST Result Date: 10/31/2023 CLINICAL DATA:  Stroke, follow up.  Dizziness. EXAM: MRI HEAD WITHOUT CONTRAST MRA HEAD WITHOUT CONTRAST MRA NECK WITHOUT AND WITH CONTRAST TECHNIQUE: Multiplanar, multi-echo pulse sequences  of the brain and surrounding structures were acquired without intravenous contrast. Angiographic images of the Circle of Willis were acquired using MRA technique without intravenous contrast. Angiographic images of the neck were acquired using MRA technique without and with intravenous contrast. Carotid stenosis measurements (when applicable) are obtained utilizing NASCET criteria, using the distal internal carotid diameter as the denominator. CONTRAST:  9mL GADAVIST  GADOBUTROL  1 MMOL/ML IV SOLN COMPARISON:  CT head 10/30/2023. MRI head, MRA head, and MRA neck 12/26/2021. FINDINGS: MRI HEAD FINDINGS Brain: There is a punctate acute infarct in the cerebellar vermis (series 5, image 70). Scattered chronic cerebral and cerebellar microhemorrhages have mildly increased in number from the prior MRI and may reflect the sequelae of chronic hypertension. Patchy to confluent T2  hyperintensities in the cerebral white matter bilaterally are similar to the prior MRI and are nonspecific but compatible with moderate to severe chronic small vessel ischemic disease. Chronic lacunar infarcts are again seen in the cerebral white matter bilaterally, right basal ganglia, left thalamus, and both cerebellar hemispheres. There is mild cerebral atrophy. No mass, midline shift, or extra-axial fluid collection is identified. Vascular: Major intracranial arterial flow voids are preserved. Skull and upper cervical spine: Unremarkable bone marrow signal. Sinuses/Orbits: Right cataract extraction. 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 Clear mastoid air cells. Other: None. MRA HEAD FINDINGS Anterior circulation: The internal carotid arteries are widely patent from skull base to carotid termini. ACAs and MCAs are patent without evidence of a proximal branch occlusion or significant proximal stenosis. No aneurysm is identified. Posterior circulation: The included portions of the intracranial vertebral arteries are widely patent to the basilar with the left being slightly dominant. Patent left PICA, right AICA, and bilateral SCA origins are visualized. The basilar artery is patent with a mild stenosis in its midportion. There are left larger than right posterior communicating arteries. Both PCAs are patent without evidence of a high-grade proximal stenosis. No aneurysm is identified. Anatomic variants: None. MRA NECK FINDINGS Evaluation is limited by contrast timing and motion artifact. Aortic arch: Normal variant aortic arch branching pattern with the left vertebral artery arising directly from the arch. Right carotid system: Patent without evidence of a significant stenosis within study limitations. Left carotid system: Patent without evidence of a significant stenosis within study limitations. Vertebral arteries: Patent with antegrade flow bilaterally. Nondiagnostic assessment for stenosis in the  V1 segments due to study limitations. No evidence of a high-grade stenosis in the V2 or V3 segments allowing for intermittent motion artifact. IMPRESSION: 1. Punctate acute infarct in the cerebellar vermis. 2. Moderate to severe chronic small vessel ischemic disease. 3. Mild basilar artery stenosis. Otherwise unremarkable head MRA. 4. Limited MRA of the neck due to contrast timing and motion. No evidence of a high-grade stenosis of the common carotid arteries, cervical internal carotid arteries, or V2 or V3 segments of the vertebral arteries. Electronically Signed   By: Aundra Lee [name]   On: 10/31/2023 13:27   MR ANGIO NECK W WO CONTRAST Result Date: 10/31/2023 CLINICAL DATA:  Stroke, follow up.  Dizziness. EXAM: MRI HEAD WITHOUT CONTRAST MRA HEAD WITHOUT CONTRAST MRA NECK WITHOUT AND WITH CONTRAST TECHNIQUE: Multiplanar, multi-echo pulse sequences of the brain and surrounding structures were acquired without intravenous contrast. Angiographic images of the Circle of Willis were acquired using MRA technique without intravenous contrast. Angiographic images of the neck were acquired using MRA technique without and with intravenous contrast. Carotid stenosis measurements (when applicable) are obtained utilizing NASCET criteria, using the distal internal  carotid diameter as the denominator. CONTRAST:  9mL GADAVIST  GADOBUTROL  1 MMOL/ML IV SOLN COMPARISON:  CT head 10/30/2023. MRI head, MRA head, and MRA neck 12/26/2021. FINDINGS: MRI HEAD FINDINGS Brain: There is a punctate acute infarct in the cerebellar vermis (series 5, image 70). Scattered chronic cerebral and cerebellar microhemorrhages have mildly increased in number from the prior MRI and may reflect the sequelae of chronic hypertension. Patchy to confluent T2 hyperintensities in the cerebral white matter bilaterally are similar to the prior MRI and are nonspecific but compatible with moderate to severe chronic small vessel ischemic disease. Chronic lacunar  infarcts are again seen in the cerebral white matter bilaterally, right basal ganglia, left thalamus, and both cerebellar hemispheres. There is mild cerebral atrophy. No mass, midline shift, or extra-axial fluid collection is identified. Vascular: Major intracranial arterial flow voids are preserved. Skull and upper cervical spine: Unremarkable bone marrow signal. Sinuses/Orbits: Right cataract extraction. 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 Clear mastoid air cells. Other: None. MRA HEAD FINDINGS Anterior circulation: The internal carotid arteries are widely patent from skull base to carotid termini. ACAs and MCAs are patent without evidence of a proximal branch occlusion or significant proximal stenosis. No aneurysm is identified. Posterior circulation: The included portions of the intracranial vertebral arteries are widely patent to the basilar with the left being slightly dominant. Patent left PICA, right AICA, and bilateral SCA origins are visualized. The basilar artery is patent with a mild stenosis in its midportion. There are left larger than right posterior communicating arteries. Both PCAs are patent without evidence of a high-grade proximal stenosis. No aneurysm is identified. Anatomic variants: None. MRA NECK FINDINGS Evaluation is limited by contrast timing and motion artifact. Aortic arch: Normal variant aortic arch branching pattern with the left vertebral artery arising directly from the arch. Right carotid system: Patent without evidence of a significant stenosis within study limitations. Left carotid system: Patent without evidence of a significant stenosis within study limitations. Vertebral arteries: Patent with antegrade flow bilaterally. Nondiagnostic assessment for stenosis in the V1 segments due to study limitations. No evidence of a high-grade stenosis in the V2 or V3 segments allowing for intermittent motion artifact. IMPRESSION: 1. 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 2. Moderate to severe chronic small vessel ischemic disease. 3. Mild basilar artery stenosis. Otherwise unremarkable head MRA. 4. Limited MRA of the neck due to contrast timing and motion. No evidence of a high-grade stenosis of the common carotid arteries, cervical internal carotid arteries, or V2 or V3 segments of the vertebral arteries. Electronically Signed   By: Aundra Lee [name]   On: 10/31/2023 13:27      Rosena Conradi, MD  [facility] 11/02/2023  If 7PM-7AM, please contact night-coverage

## 2023-11-02 NOTE — Progress Notes (Signed)
 Physical Therapy Treatment Patient Details Name: [name]. MRN: [id number] DOB: 1955-11-05 Today's Date: 11/02/2023   History of Present Illness Patient is 68 y.o. male presented to [facility] 10/30/23 with multiple complaints including dizziness, lightheadedness ataxia shortness of breath chills vomiting and difficulty concentrating.  Pt recently admitted 3/25-3/28 for CHF and dc'd on home O2. CT of head negative, MRI of the brain revealed acute punctate infarct in the cerebellar vermis; MRA  Mild basilar artery stenosis. PMH significant for OA, CAD, HLD, HTN, PVC's, Multiple Stokes, DMII.    PT Comments  Pt is progressing towards goals. Currently pt is supervision for bed mobility for safety due to impulsivity, CGA for sit to stand and Min A for gait with HHA. Pt requires Min A for stairs per home set up due to impulsivity and poor safety awareness. Pt lives alone. Due to pt current functional status, home set up and available assistance at home recommending skilled physical therapy services > 3 hours/day in order to address strength, balance and functional mobility to decrease risk for falls, injury, immobility, skin break down and re-hospitalization.      If plan is discharge home, recommend the following: A lot of help with walking and/or transfers;Assistance with cooking/housework;Help with stairs or ramp for entrance;Assist for transportation;Supervision due to cognitive status     Equipment Recommendations  Rolling walker (2 wheels)    Recommendations for Other Services       Precautions / Restrictions Precautions Precautions: Fall Recall of Precautions/Restrictions: Intact Restrictions Weight Bearing Restrictions Per Provider Order: No     Mobility  Bed Mobility Overal bed mobility: Needs Assistance Bed Mobility: Supine to Sit, Sit to Supine Rolling: Supervision   Supine to sit: Supervision Sit to supine: Supervision        Transfers Overall transfer level: Needs  assistance Equipment used: Rolling walker (2 wheels) Transfers: Sit to/from Stand Sit to Stand: Contact guard assist                Ambulation/Gait Ambulation/Gait assistance: Min assist Gait Distance (Feet): 200 Feet Assistive device: 1 person hand held assist Gait Pattern/deviations: Step-through pattern, Decreased step length - right, Decreased step length - left, Decreased stride length, Shuffle, Staggering left, Drifts right/left, Trunk flexed, Staggering right Gait velocity: Quick movements, pt has to be reminded to slow down. Gait velocity interpretation: <1.8 ft/sec, indicate of risk for recurrent falls       Stairs Stairs: Yes Stairs assistance: Min assist Stair Management: No rails Number of Stairs: 2 General stair comments: physical therapist asked pt to use rails. Pt did not use rails. Min A to prevent falls. pt got to top of stairs and turned quickly then reported dizziness requiring Min A again to prevent fall. descending stairs pt was made to stop and use rail prior to starting and pt did use rail this time requiring CGA    Modified Rankin (Stroke Patients Only) Modified Rankin (Stroke Patients Only) Pre-Morbid Rankin Score: No symptoms Modified Rankin: Slight disability     Balance Overall balance assessment: Needs assistance Sitting-balance support: Bilateral upper extremity supported, Feet supported Sitting balance-Leahy Scale: Fair Sitting balance - Comments: increased use of BUE for support at EOB   Standing balance support: During functional activity, Single extremity supported, Bilateral upper extremity supported Standing balance-Leahy Scale: Poor Standing balance comment: reliant on support from therapist or external support        Communication Communication Communication: No apparent difficulties  Cognition Arousal: Alert Behavior During Therapy: [name]  for tasks assessed/performed   PT - Cognitive impairments: No apparent impairments,  Awareness, Safety/Judgement, No family/caregiver present to determine baseline     PT - Cognition Comments: pt put foot on bed to put sock on foot. Pt was educated this is not safe currently and took time to convince this was not safe. Pt had difficutly returning to room Following commands: Intact      Cueing Cueing Techniques: Verbal cues, Tactile cues, Visual cues     General Comments General comments (skin integrity, edema, etc.): Pt was slightly short of breathe after gait.      Pertinent Vitals/Pain Pain Assessment Pain Assessment: No/denies pain Pain Intervention(s): Monitored during session     PT Goals (current goals can now be found in the care plan section) Acute Rehab PT Goals Patient Stated Goal: return home and get better PT Goal Formulation: With patient Time For Goal Achievement: 11/15/23 Potential to Achieve Goals: Good Progress towards PT goals: Progressing toward goals    Frequency    Min 3X/week      PT Plan  Continue with current POC        AM-PAC PT "6 Clicks" Mobility   Outcome Measure  Help needed turning from your back to your side while in a flat bed without using bedrails?: A Little Help needed moving from lying on your back to sitting on the side of a flat bed without using bedrails?: A Little Help needed moving to and from a bed to a chair (including a wheelchair)?: A Little Help needed standing up from a chair using your arms (e.g., wheelchair or bedside chair)?: A Little Help needed to walk in hospital room?: A Little Help needed climbing 3-5 steps with a railing? : A Lot 6 Click Score: 17    End of Session Equipment Utilized During Treatment: Gait belt Activity Tolerance: Patient tolerated treatment well Patient left: in bed;with call bell/phone within reach;with bed alarm set Nurse Communication: Mobility status PT Visit Diagnosis: Other abnormalities of gait and mobility (R26.89);Muscle weakness (generalized) (M62.81);Difficulty  in walking, not elsewhere classified (R26.2);Other symptoms and signs involving the nervous system (R29.898)     Time: 1610-9604 PT Time Calculation (min) (ACUTE ONLY): 11 min  Charges:    $Therapeutic Activity: 8-22 mins PT General Charges $$ ACUTE PT VISIT: 1 Visit                     Sloan Duncans, DPT, CLT  Acute Rehabilitation Services Office: [phone] (Secure chat preferred)    Jenice Mitts 11/02/2023, 3:01 PM

## 2023-11-02 NOTE — PMR Pre-admission (Signed)
 PMR Admission Coordinator Pre-Admission Assessment  Patient: [name]. is an 68 y.o., male MRN: [id number] DOB: [birth year] Height: 5\' 6"  (167.6 cm) Weight: 95.3 kg  Insurance Information HMO:     PPO:      PCP:      IPA:      80/20:      OTHER:  PRIMARY: [place] [employer] healthy blue       Policy#: [id number]      Subscriber: Pt CM Name: Leola Raisin      Phone#: [phone]     Fax#: [phone] Pre-Cert#: [id number]   Received insurance authorization on 11/02/23. Pt approved from 11/03/23-11/09/23    Employer:  Benefits:  Phone #: [phone]     Name:  Venson Ginger Date: 02/02/2022 to 07/04/2024 Deductible: $0 - does not have one OOP Max: $0 - does not have one CIR: 100% coverage SNF: 100% coverage Outpatient: $4 copay/visit Home Health: 100% coverage DME: 100% coverage Providers: in network  SECONDARY:       Policy#:      Phone#:   The "Data Collection Information Summary" for patients in Inpatient Rehabilitation Facilities with attached "Privacy Act Statement-Health Care Records" was provided and verbally reviewed with: Patient  Emergency Contact Information Contact Information     Name Relation Home Work Mobile   HUNG, KRAVCHUK Son [phone]     HUMPHRIES,MISTY Daughter   [phone]      Other Contacts   None on File     Current Medical History  Patient Admitting Diagnosis: CVA History of Present Illness:  [name]. is a 68 y.o. male with history of HFrEF 30 to 35%, ischemic cardiomyopathy, s/p AICD, CVA, CAD s/p LCx stent, COPD, V. tach s/p AICD and chronic PVC who has been initially presented to emergency department at [facility] 10/31/23 with multiple complaint includes  dizziness, lightheadedness, ataxia shortness of breath, chill, vomiting and difficulty concentrating. Patient was recently admitted from 3/25 until 3/28 for CHF exacerbation..  He has a known history of an EF of 30 to 35% and has had recurrent VT status post ICD placement.  Patient was  discharged with oxygen, furosemide  40 mg, spironolactone .  He presented to the ED with worsening shortness of breath.Upon arrival to the ER, BP was 152/104 with respiratory rate of 23 and otherwise normal vital signs. Normal troponin 14 and 16. Respiratory panel unremarkable. Elevated BNP 328. CBC showing leukocytosis 13.8 stable H&H and normal platelet count. BMP showing low bicarb 18 otherwise unremarkable. EKG showed sinus rhythm with rate of 70 with occasional PVCs and left posterior fascicular block with PVCs. Chest x-ray no evidence of pulmonary edema.  Enlarged cardiac silhouette. Noncontrasted head CT scan revealed atrophy and chronic small vessel ischemic change with no acute intracranial normalities.MRI of the brain revealed acute punctate infarct in the cerebellar vermis; MRA Mild basilar artery stenosis. Transferred to [facility] on 10/31/23 for further management. Neurology recommends aspirin  81 mg daily and clopidogrel  75 mg daily for 3 weeks and then plavix  alone.  And Lovenox  for VTE prophylaxis. Pt. Seen by PT/OT and they recommend CIR to assist return to PLOF.   Complete NIHSS TOTAL: 0  Patient's medical record from [facility]  has been reviewed by the rehabilitation admission coordinator and physician.  Past Medical History  Past Medical History:  Diagnosis Date   Anginal pain (HCC)    Arthritis    CAD (coronary artery disease)    a. s/p MI with  LAD and Diag stenting @ [name];  b. 06/2015 Cath: LAD 31m/d ISR, 100 RCA (ISR) w/ L->R collats, otw mod nonobs dzs-->Med Rx; c. 08/2019 Cath: LM nl, LAD 10p/m ISR, D1 20, D2 100, RI min irregs, LCX 30m/d, OM1 100, OM2 50, RCA 100p, 70d. RPDA fills via collats from LAD. EF 25-35%-->Med Rx; d. 10/2019 NSTEMI/Cath: LCX now 100 (2.75x15 Resolute Onyx DES), otw stable compared to 08/2019.   Chronic combined systolic and diastolic CHF (congestive heart failure) (HCC)    a. 06/2015 Echo: EF 20-25%, Gr3 DD; b. 10/2019  Echo: EF 25-30%, glob HK, sev inf/infapical HK. Mod dil LA.   Dyspnea    Essential hypertension    Headache 12/23/2021   Hyperlipidemia    Hypokalemia    a. 06/2015 in setting of diuresis.   Ischemic cardiomyopathy    a. 2011 EF 45% ([name]);  b. 06/2015 Echo: EF 20-25%; c. 04/2019 Echo: EF 40-45%; d. 08/2019 LV gram: EF 25-35%; e. 10/2019 Echo: EF 25-30%.   PVC's (premature ventricular contractions)    a. 09/2019 Zio (3 days): Avg hr 70, 4 runs NSVT, 5 runs SVT, rare PACs, frequent PVCs w/ 11.8% burden.   Sleep apnea    Stroke [facility])    Stroke/Right temporal lobe infarction [facility])    a. 10/2019 MRI brain: 1cm acute ischemic nonhemorrhagic R temporal lobe infarct. Age-related cerebral atrophy w/ moderate chronic small vessel ischemic dzs.   Type 2 diabetes mellitus with hyperglycemia (HCC) 10/08/2019    Has the patient had major surgery during 100 days prior to admission? No  Family History   family history includes Coronary artery disease in his father and mother; Diabetes in his mother.  Current Medications  Current Facility-Administered Medications:    acetaminophen  (TYLENOL ) tablet 650 mg, 650 mg, Oral, Q6H PRN, 650 mg at 11/01/23 1238 **OR** acetaminophen  (TYLENOL ) suppository 650 mg, 650 mg, Rectal, Q6H PRN, Sundil, Subrina, MD   aspirin  EC tablet 81 mg, 81 mg, Oral, Daily, Sundil, Subrina, MD, 81 mg at 11/02/23 9604   carvedilol  (COREG ) tablet 3.125 mg, 3.125 mg, Oral, BID WC, Sundil, Subrina, MD, 3.125 mg at 11/02/23 5409   clopidogrel  (PLAVIX ) tablet 75 mg, 75 mg, Oral, Daily, Consuelo Denmark, MD, 75 mg at 11/02/23 8119   enoxaparin  (LOVENOX ) injection 40 mg, 40 mg, Subcutaneous, Q24H, Consuelo Denmark, MD, 40 mg at 11/01/23 1843   ezetimibe  (ZETIA ) tablet 10 mg, 10 mg, Oral, Daily, Sundil, Subrina, MD, 10 mg at 11/02/23 1478   fluticasone  furoate-vilanterol (BREO ELLIPTA ) 200-25 MCG/ACT 1 puff, 1 puff, Inhalation, Daily, Sundil, Subrina, MD, 1 puff at 11/02/23 2956   insulin  aspart  (novoLOG ) injection 0-5 Units, 0-5 Units, Subcutaneous, QHS, Sundil, Subrina, MD   insulin  aspart (novoLOG ) injection 0-6 Units, 0-6 Units, Subcutaneous, TID WC, Sundil, Subrina, MD, 2 Units at 11/02/23 1339   ipratropium-albuterol  (DUONEB) 0.5-2.5 (3) MG/3ML nebulizer solution 3 mL, 3 mL, Nebulization, Q4H PRN, Sundil, Subrina, MD   meclizine  (ANTIVERT ) tablet 12.5 mg, 12.5 mg, Oral, TID PRN, Sundil, Subrina, MD, 12.5 mg at 10/31/23 2146   nitroGLYCERIN  (NITROSTAT ) SL tablet 0.4 mg, 0.4 mg, Sublingual, Q5 min PRN, Sundil, Subrina, MD   prochlorperazine  (COMPAZINE ) injection 10 mg, 10 mg, Intravenous, Q6H PRN, Sundil, Subrina, MD, 10 mg at 10/31/23 2144   sodium chloride  flush (NS) 0.9 % injection 3 mL, 3 mL, Intravenous, Q12H, Sundil, Subrina, MD, 3 mL at 11/02/23 1012   torsemide  (DEMADEX ) tablet 20 mg, 20 mg, Oral, Daily, Sundil, Subrina, MD, 20 mg at 11/02/23  9604  Patients Current Diet:  Diet Order             Diet heart healthy/carb modified Room service appropriate? Yes with Assist; Fluid consistency: Thin; Fluid restriction: 2000 mL Fluid  Diet effective 1400                   Precautions / Restrictions Precautions Precautions: Fall Restrictions Weight Bearing Restrictions Per Provider Order: No   Has the patient had 2 or more falls or a fall with injury in the past year? No  Prior Activity Level Community (5-7x/wk): Pt. active in the community PTA  Prior Functional Level Self Care: Did the patient need help bathing, dressing, using the toilet or eating? Independent  Indoor Mobility: Did the patient need assistance with walking from room to room (with or without device)? Independent  Stairs: Did the patient need assistance with internal or external stairs (with or without device)? Independent  Functional Cognition: Did the patient need help planning regular tasks such as shopping or remembering to take medications? Independent  Patient Information Are you of  Hispanic, Latino/a,or Spanish origin?: A. No, not of Hispanic, Latino/a, or Spanish origin What is your race?: A. White Do you need or want an interpreter to communicate with a doctor or health care staff?: 0. No  Patient's Response To:  Health Literacy and Transportation Is the patient able to respond to health literacy and transportation needs?: Yes Health Literacy - How often do you need to have someone help you when you read instructions, pamphlets, or other written material from your doctor or pharmacy?: Never In the past 12 months, has lack of transportation kept you from medical appointments or from getting medications?: No In the past 12 months, has lack of transportation kept you from meetings, work, or from getting things needed for daily living?: No  Home Assistive Devices / Equipment Home Equipment: None  Prior Device Use: Indicate devices/aids used by the patient prior to current illness, exacerbation or injury? None of the above  Current Functional Level Cognition  Orientation Level: Oriented X4    Extremity Assessment (includes Sensation/Coordination)  Upper Extremity Assessment: Overall WFL for tasks assessed  Lower Extremity Assessment: Defer to PT evaluation RLE Deficits / Details: grossly 4/5 throughout RLE Sensation: decreased light touch (?peripheral neuropathy) LLE Deficits / Details: grossly 4-/5 with greater weakness at dorsiflexion due to pain 3+/5 LLE Sensation: decreased light touch (?peripheral neuropathy)    ADLs  Overall ADL's : Needs assistance/impaired Eating/Feeding: Independent Grooming: Contact guard assist, Standing Upper Body Bathing: Minimal assistance, Sitting Lower Body Bathing: Moderate assistance, Sit to/from stand Upper Body Dressing : Contact guard assist, Sitting Lower Body Dressing: Moderate assistance, Sit to/from stand Toilet Transfer: Minimal assistance Functional mobility during ADLs: Minimal assistance, Rolling walker (2  wheels) General ADL Comments: Performs transitional movements with increased time to manage dizziness level    Mobility  Overal bed mobility: Needs Assistance Bed Mobility: Supine to Sit, Sit to Supine Rolling: Modified independent (Device/Increase time) Supine to sit: Modified independent (Device/Increase time), HOB elevated Sit to supine: Modified independent (Device/Increase time), HOB elevated    Transfers  Overall transfer level: Needs assistance Equipment used: Rolling walker (2 wheels) Transfers: Sit to/from Stand Sit to Stand: Contact guard assist    Ambulation / Gait / Stairs / Wheelchair Mobility  Ambulation/Gait Ambulation/Gait assistance: Editor, commissioning (Feet): 15 Feet (2x15) Assistive device: 1 person hand held assist Gait Pattern/deviations: Step-through pattern, Decreased step length - right, Decreased step  length - left, Decreased stride length, Shuffle, Staggering left, Drifts right/left, Trunk flexed Gait velocity: decr    Posture / Balance Dynamic Sitting Balance Sitting balance - Comments: increased use of BUE for support at EOB Balance Overall balance assessment: Needs assistance Sitting-balance support: Bilateral upper extremity supported, Feet supported Sitting balance-Leahy Scale: Fair Sitting balance - Comments: increased use of BUE for support at EOB Standing balance support: During functional activity, Single extremity supported, Bilateral upper extremity supported Standing balance-Leahy Scale: Poor Standing balance comment: reliant on support from therapist or external support    Special needs/care consideration Skin intact    Previous Home Environment (from acute therapy documentation) Living Arrangements: Alone  Lives With: Spouse Available Help at Discharge: Family Type of Home: House Home Layout: One level Home Access: Stairs to enter Secretary/administrator of Steps: 2 Bathroom Shower/Tub: Health visitor:  Administrator Accessibility: Yes Home Care Services: No Additional Comments: family (son) close by  Discharge Living Setting Plans for Discharge Living Setting: Patient's home Type of Home at Discharge: House Discharge Home Layout: One level Discharge Home Access: Stairs to enter Entrance Stairs-Rails: None Entrance Stairs-Number of Steps: 2 Discharge Bathroom Shower/Tub: Walk-in shower Discharge Bathroom Toilet: Standard Discharge Bathroom Accessibility: Yes How Accessible: Accessible via walker, Accessible via wheelchair Does the patient have any problems obtaining your medications?: No  Social/Family/Support Systems Patient Roles: Other (Comment) Contact Information: Pt. with Mod I goals  Goals Patient/Family Goal for Rehab: PT/OT/SLP min A Expected length of stay: 10-14 days Pt/Family Agrees to Admission and willing to participate: Yes Program Orientation Provided & Reviewed with Pt/Caregiver Including Roles  & Responsibilities: Yes  Decrease burden of Care through IP rehab admission: not anticipated  Possible need for SNF placement upon discharge: not anticipated  Patient Condition: I have reviewed medical records from [facility] , spoken with CM, and patient. I met with patient at the bedside for inpatient rehabilitation assessment.  Patient will benefit from ongoing PT and OT, can actively participate in 3 hours of therapy a day 5 days of the week, and can make measurable gains during the admission.  Patient will also benefit from the coordinated team approach during an Inpatient Acute Rehabilitation admission.  The patient will receive intensive therapy as well as Rehabilitation physician, nursing, social worker, and care management interventions.  Due to safety, skin/wound care, disease management, medication administration, pain management, and patient education the patient requires 24 hour a day rehabilitation nursing.  The patient is currently Contact  G-Min A with mobility and Supervision with basic ADLs.  Discharge setting and therapy post discharge at home with home health is anticipated.  Patient has agreed to participate in the Acute Inpatient Rehabilitation Program and will admit today.  Preadmission Screen Completed By:  Dorena Gander, 11/02/2023 2:07 PM ______________________________________________________________________   Discussed status with Dr. Dorn Gaskins on 11/04/23  at 1:43 PM and received approval for admission today.  Admission Coordinator:  Dorena Gander, CCC-SLP, time 1:43 PM/Date 11/04/23    Assessment/Plan: Diagnosis: Cerebellar CVA Does the need for close, 24 hr/day Medical supervision in concert with the patient's rehab needs make it unreasonable for this patient to be served in a less intensive setting? Yes Co-Morbidities requiring supervision/potential complications: Vertigo, ataxia, acute on chronic heart failure, hypertension, arrhythmia status post ICD, OSA, generalized anxiety, type 2 diabetes, COPD, obesity Due to bladder management, bowel management, safety, skin/wound care, disease management, medication administration, pain management, and patient education, does the patient require 24 hr/day  rehab nursing? Yes Does the patient require coordinated care of a physician, rehab nurse, PT, OT to address physical and functional deficits in the context of the above medical diagnosis(es)? Yes Addressing deficits in the following areas: balance, endurance, locomotion, strength, transferring, bowel/bladder control, bathing, dressing, feeding, grooming, and toileting Can the patient actively participate in an intensive therapy program of at least 3 hrs of therapy 5 days a week? Yes The potential for patient to make measurable gains while on inpatient rehab is good Anticipated functional outcomes upon discharge from inpatient rehab: min assist PT, min assist OT Estimated rehab length of stay to reach the above functional  goals is: 10-14 days Anticipated discharge destination: Home 10. Overall Rehab/Functional Prognosis: good   MD Signature:  Bea Lime, DO 11/04/2023

## 2023-11-02 NOTE — Plan of Care (Signed)

## 2023-11-03 DIAGNOSIS — R42 Dizziness and giddiness: Secondary | ICD-10-CM | POA: Diagnosis not present

## 2023-11-03 LAB — CBC
HCT: 48.9 % (ref 39.0–52.0)
Hemoglobin: 17 g/dL (ref 13.0–17.0)
MCH: 29.3 pg (ref 26.0–34.0)
MCHC: 34.8 g/dL (ref 30.0–36.0)
MCV: 84.3 fL (ref 80.0–100.0)
Platelets: 213 10*3/uL (ref 150–400)
RBC: 5.8 MIL/uL (ref 4.22–5.81)
RDW: 14.6 % (ref 11.5–15.5)
WBC: 13.9 10*3/uL — ABNORMAL HIGH (ref 4.0–10.5)
nRBC: 0 % (ref 0.0–0.2)

## 2023-11-03 LAB — GLUCOSE, CAPILLARY
Glucose-Capillary: 159 mg/dL — ABNORMAL HIGH (ref 70–99)
Glucose-Capillary: 215 mg/dL — ABNORMAL HIGH (ref 70–99)
Glucose-Capillary: 191 mg/dL — ABNORMAL HIGH (ref 70–99)
Glucose-Capillary: 293 mg/dL — ABNORMAL HIGH (ref 70–99)

## 2023-11-03 LAB — BASIC METABOLIC PANEL WITH GFR
Anion gap: 12 (ref 5–15)
BUN: 20 mg/dL (ref 8–23)
CO2: 23 mmol/L (ref 22–32)
Calcium: 8.9 mg/dL (ref 8.9–10.3)
Chloride: 103 mmol/L (ref 98–111)
Creatinine, Ser: 1.23 mg/dL (ref 0.61–1.24)
GFR, Estimated: >60 mL/min (ref >60)
Glucose, Bld: 141 mg/dL — ABNORMAL HIGH (ref 70–99)
Potassium: 3.8 mmol/L (ref 3.5–5.1)
Sodium: 138 mmol/L (ref 135–145)

## 2023-11-03 LAB — MAGNESIUM: Magnesium: 2 mg/dL (ref 1.7–2.4)

## 2023-11-03 MED ORDER — ORAL CARE MOUTH RINSE: 15.0000 mL | OROMUCOSAL | Status: DC | PRN

## 2023-11-03 MED ORDER — POLYETHYLENE GLYCOL 3350 17 G PO PACK
17.0000 g | PACK | Freq: Every day | ORAL | Status: DC
  Administered 2023-11-03 – 2023-11-04 (×2): 17 g via ORAL
  Filled 2023-11-03 (×2): qty 1

## 2023-11-03 MED ORDER — DOCUSATE SODIUM 100 MG PO CAPS
100.0000 mg | ORAL_CAPSULE | Freq: Two times a day (BID) | ORAL | Status: DC
  Administered 2023-11-03 – 2023-11-04 (×3): 100 mg via ORAL
  Filled 2023-11-03 (×3): qty 1

## 2023-11-03 NOTE — Progress Notes (Signed)
 Occupational Therapy Treatment Patient Details Name: [name]. MRN: [id number] DOB: Oct 08, 1955 Today's Date: 11/03/2023   History of present illness Patient is 68 y.o. male presented to [facility] 10/30/23 with multiple complaints including dizziness, lightheadedness ataxia shortness of breath chills vomiting and difficulty concentrating.  Pt recently admitted 3/25-3/28 for CHF and dc'd on home O2. CT of head negative, MRI of the brain revealed acute punctate infarct in the cerebellar vermis; MRA  Mild basilar artery stenosis. PMH significant for OA, CAD, HLD, HTN, PVC's, Multiple Stokes, DMII.   OT comments  Patient demonstrating good improvement with decreased intensity of dizziness during transitional movements.  Reported that he has only brief dizziness with supine to sit and  sit to and from stand.   Able to move through his room with supervision only and no AD today.  Instructed patient on VOR x1 exercises for adaptation of the vestibular system.  Able to perform 10 reps with mild increase in dizziness.  Instructed patient to stop and wait til symptoms subside as there could be a cumulative effect with repeated exercise and patient verbalized understanding. Patient will benefit from intensive inpatient follow-up therapy, >3 hours/day and continued OT on acute       If plan is discharge home, recommend the following:  A little help with walking and/or transfers;A little help with bathing/dressing/bathroom;Assistance with cooking/housework;Direct supervision/assist for medications management;Assist for transportation;Help with stairs or ramp for entrance   Equipment Recommendations  Tub/shower seat    Recommendations for Other Services Rehab consult    Precautions / Restrictions Precautions Precautions: Fall Recall of Precautions/Restrictions: Intact Restrictions Weight Bearing Restrictions Per Provider Order: No       Mobility Bed Mobility Overal bed mobility: Modified  Independent Bed Mobility: Supine to Sit     Supine to sit: Modified independent (Device/Increase time)          Transfers Overall transfer level: Needs assistance   Transfers: Sit to/from Stand Sit to Stand: Supervision                 Balance Overall balance assessment: Needs assistance Sitting-balance support: No upper extremity supported, Feet supported Sitting balance-Leahy Scale: Good Sitting balance - Comments: Able to sit EOB and reach out of BOS                                   ADL either performed or assessed with clinical judgement   ADL Overall ADL's : Needs assistance/impaired Eating/Feeding: Independent   Grooming: Wash/dry hands;Wash/dry face;Oral care;Supervision/safety   Upper Body Bathing: Supervision/ safety;Standing   Lower Body Bathing: Sit to/from stand;Supervison/ safety   Upper Body Dressing : Supervision/safety;Sitting   Lower Body Dressing: Supervision/safety;Sit to/from stand   Toilet Transfer: Supervision/safety           Functional mobility during ADLs: Supervision/safety General ADL Comments: Patient is performing adls at a supervision level with a wide BOS.  His balance deteriorates as the activity goes on    Extremity/Trunk Assessment Upper Extremity Assessment Upper Extremity Assessment: Overall WFL for tasks assessed   Lower Extremity Assessment Lower Extremity Assessment: Defer to PT evaluation        Vision   Additional Comments: Intiated VOR exercises. Upon arrival patient stated he has had improvement with the intensity of his  dizzy spells and has learned to be "careful" with mvovemt.   Perception     Praxis     Communication  Communication Communication: No apparent difficulties   Cognition Arousal: Alert Behavior During Therapy: WFL for tasks assessed/performed Cognition: No apparent impairments                               Following commands: Intact        Cueing    Cueing Techniques: Verbal cues, Visual cues  Exercises      Shoulder Instructions       General Comments Patient performed VOR x1 exercises in various plans.  Instucted to do exercise x10 and then stop to prevent any cummulitive effects.  Patient verbalized understanding.    Pertinent Vitals/ Pain       Pain Assessment Pain Assessment: No/denies pain  Home Living                                          Prior Functioning/Environment              Frequency  Min 2X/week        Progress Toward Goals  OT Goals(current goals can now be found in the care plan section)  Progress towards OT goals: Progressing toward goals  Acute Rehab OT Goals OT Goal Formulation: With patient Time For Goal Achievement: 11/15/23 Potential to Achieve Goals: Good ADL Goals Pt Will Transfer to Toilet: with modified independence;ambulating Pt/caregiver will Perform Home Exercise Program: With written HEP provided;Independently Additional ADL Goal #1: Patient will be able to perform 15 minutes of standing adl activity with complaints of dizziness <3/10  Plan      Co-evaluation                 AM-PAC OT "6 Clicks" Daily Activity     Outcome Measure   Help from another person eating meals?: None Help from another person taking care of personal grooming?: A Little Help from another person toileting, which includes using toliet, bedpan, or urinal?: A Little Help from another person bathing (including washing, rinsing, drying)?: A Little Help from another person to put on and taking off regular upper body clothing?: A Little Help from another person to put on and taking off regular lower body clothing?: A Little 6 Click Score: 19    End of Session    OT Visit Diagnosis: Unsteadiness on feet (R26.81);Dizziness and giddiness (R42);Muscle weakness (generalized) (M62.81)   Activity Tolerance Patient tolerated treatment well (Saw patient after OT treatment working  with PT and pt stated he did feel sick after OT treatment.  Reinstructed patient to moderate the amount of exercises he did and he verbalized understanding)   Patient Left     Nurse Communication Mobility status        Time: 3086-5784 OT Time Calculation (min): 24 min  Charges: OT General Charges $OT Visit: 1 Visit OT Treatments $Therapeutic Exercise: 23-37 mins  Chantal Comment OTR/L   Clementina Cutter 11/03/2023, 2:41 PM

## 2023-11-03 NOTE — Progress Notes (Signed)
 Physical Therapy Treatment Patient Details Name: [name]. MRN: [id number] DOB: 1955-12-12 Today's Date: 11/03/2023   History of Present Illness Patient is 68 y.o. male presented to [facility] 10/30/23 with multiple complaints including dizziness, lightheadedness ataxia shortness of breath chills vomiting and difficulty concentrating.  Pt recently admitted 3/25-3/28 for CHF and dc'd on home O2. CT of head negative, MRI of the brain revealed acute punctate infarct in the cerebellar vermis; MRA  Mild basilar artery stenosis. PMH significant for OA, CAD, HLD, HTN, PVC's, Multiple Stokes, DMII.    PT Comments  Pt tolerated treatment well today. Pt able to ambulate in hallway with Min A HHA. Pt was a little unsteady reaching for external support. Pt reporting residual dizziness from OT session as well as nausea. Pt has 1 LOB when reaching for door requiring Min A to correct. No change in DC/DME recs at this time. PT will continue to follow.      If plan is discharge home, recommend the following: A lot of help with walking and/or transfers;Assistance with cooking/housework;Help with stairs or ramp for entrance;Assist for transportation;Supervision due to cognitive status   Can travel by private vehicle        Equipment Recommendations  Rolling walker (2 wheels)    Recommendations for Other Services       Precautions / Restrictions Precautions Precautions: Fall Recall of Precautions/Restrictions: Intact Restrictions Weight Bearing Restrictions Per Provider Order: No     Mobility  Bed Mobility Overal bed mobility: Modified Independent Bed Mobility: Supine to Sit     Supine to sit: Modified independent (Device/Increase time)          Transfers Overall transfer level: Needs assistance   Transfers: Sit to/from Stand Sit to Stand: Contact guard assist                Ambulation/Gait Ambulation/Gait assistance: Min assist Gait Distance (Feet): 200 Feet Assistive device: 1  person hand held assist (Refused RW) Gait Pattern/deviations: Step-through pattern, Decreased step length - right, Decreased step length - left, Decreased stride length, Shuffle, Staggering left, Drifts right/left, Trunk flexed, Staggering right Gait velocity: Quick movements, pt has to be reminded to slow down.     General Gait Details: Pt was a little unsteady reaching for external support. Pt reporting residual dizziness from OT session as well as nausea. Pt has 1 LOB when reaching for door requiring Min A to correct.   Stairs Stairs: Yes Stairs assistance: Contact guard assist Stair Management: Two rails, Forwards, Alternating pattern Number of Stairs: 2 General stair comments: Cues to use rails and slow down.   Wheelchair Mobility     Tilt Bed    Modified Rankin (Stroke Patients Only)       Balance Overall balance assessment: Needs assistance Sitting-balance support: No upper extremity supported, Feet supported Sitting balance-Leahy Scale: Good Sitting balance - Comments: Able to sit EOB and reach out of BOS                                    Communication Communication Communication: No apparent difficulties  Cognition Arousal: Alert Behavior During Therapy: WFL for tasks assessed/performed                             Following commands: Intact      Cueing Cueing Techniques: Verbal cues, Visual cues  Exercises  General Comments General comments (skin integrity, edema, etc.): VSS      Pertinent Vitals/Pain Pain Assessment Pain Assessment: No/denies pain    Home Living                          Prior Function            PT Goals (current goals can now be found in the care plan section) Progress towards PT goals: Progressing toward goals    Frequency    Min 3X/week      PT Plan      Co-evaluation              AM-PAC PT "6 Clicks" Mobility   Outcome Measure  Help needed turning from your  back to your side while in a flat bed without using bedrails?: A Little Help needed moving from lying on your back to sitting on the side of a flat bed without using bedrails?: A Little Help needed moving to and from a bed to a chair (including a wheelchair)?: A Little Help needed standing up from a chair using your arms (e.g., wheelchair or bedside chair)?: A Little Help needed to walk in hospital room?: A Little Help needed climbing 3-5 steps with a railing? : A Lot 6 Click Score: 17    End of Session Equipment Utilized During Treatment: Gait belt Activity Tolerance: Patient tolerated treatment well Patient left: in bed;with call bell/phone within reach;with bed alarm set Nurse Communication: Mobility status PT Visit Diagnosis: Other abnormalities of gait and mobility (R26.89);Muscle weakness (generalized) (M62.81);Difficulty in walking, not elsewhere classified (R26.2);Other symptoms and signs involving the nervous system (R29.898)     Time: 9562-1308 PT Time Calculation (min) (ACUTE ONLY): 8 min  Charges:    $Gait Training: 8-22 mins PT General Charges $$ ACUTE PT VISIT: 1 Visit                     Leidy Massar B, PT, DPT Acute Rehab Services [phone]    Skipper Dacosta 11/03/2023, 3:57 PM

## 2023-11-03 NOTE — TOC Progression Note (Signed)
 Transition of Care (TOC) - Progression Note    Patient Details  Name: [name]. MRN: [id number] Date of Birth: 1955-08-20  Transition of Care [facility]) CM/SW Contact  Jonathan Neighbor, RN Phone Number: 11/03/2023, 10:54 AM  Clinical Narrative:     CIR has initiated insurance for a rehab admission. TOC following.  Expected Discharge Plan: IP Rehab Facility Barriers to Discharge: Continued Medical Work up  Expected Discharge Plan and Services   Discharge Planning Services: CM Consult   Living arrangements for the past 2 months: Single Family Home                                       Social Determinants of Health (SDOH) Interventions SDOH Screenings   Food Insecurity: No Food Insecurity (10/31/2023)  Housing: Low Risk  (10/31/2023)  Transportation Needs: No Transportation Needs (10/31/2023)  Utilities: At Risk (10/31/2023)  Depression (PHQ2-9): Low Risk  (03/09/2022)  Financial Resource Strain: Medium Risk (09/29/2023)  Physical Activity: Inactive (04/30/2019)  Social Connections: Moderately Isolated (10/31/2023)  Stress: No Stress Concern Present (04/30/2019)  Tobacco Use: Medium Risk (10/31/2023)    Readmission Risk Interventions     No data to display

## 2023-11-03 NOTE — Progress Notes (Signed)
 Inpatient Rehab Admissions Coordinator:    CIR following. Case pending with insurance. Pt. States he does want to come but has limited support at home. He does have potential for mod I goals on CIR, so I think he remains an appropriate candidate.   Wandalee Gust, MS, CCC-SLP Rehab Admissions Coordinator  [phone] (celll) [phone] (office)

## 2023-11-03 NOTE — Progress Notes (Signed)
 PROGRESS NOTE  [name]. ZJQ:[id number] DOB: 1955-09-02 DOA: 10/31/2023 PCP: Patient, No Pcp Per   LOS: 3 days   Brief narrative:   [name]. is a 68 y.o. male with history of systolic congestive heart failure with last known ejection fraction of 30 to 35%,  CVA, CAD s/p LCx stent, COPD, V. tach s/p AICD and chronic PVC who presented to hospital with multiple complaints including dizziness, lightheadedness ataxia shortness of breath chills vomiting and difficulty concentrating.  Of note patient was recently admitted between 3/25 until 3/28 for CHF exacerbation and was discharged with oxygen, furosemide  40 mg, spironolactone .  He complains of worsening shortness of breath, lightheadedness and nausea mostly with movement.  In the ED blood pressure was elevated and was tachypneic.  Troponins were normal.  Respiratory panel was unremarkable.  BNP elevated at 328.  CBC showed mild leukocytosis at 13.8.  EKG showed normal sinus rhythm.  Chest x-ray without any pulmonary edema.  CT head scan showed atrophy.  AICD interrogation was done without any events.  Patient was noted to be orthostatic in the ED.  Patient was transferred to [facility] given his AICD for further workup with dizziness and rule out stroke.        Assessment/Plan: Principal Problem:   Dizziness Active Problems:   Essential hypertension   History of CVA (cerebrovascular accident)   HFrEF (heart failure with reduced ejection fraction) (HCC)   History of CAD (coronary artery disease)   Non-insulin  dependent type 2 diabetes mellitus (HCC)   History of COPD   GAD (generalized anxiety disorder)  Dizziness and ataxia Improved at this time.  MRI brain showed infarct in the cerebellar vermis.  Neurology on board at this time and recommend aspirin  and Plavix .  2D echocardiogram with LV ejection fraction of 30 to 35% with regional wall motion abnormality and grade 1 diastolic dysfunction.  PT OT evaluation has  recommended CIR at this time.  Chest discomfort yesterday.  Troponin negative.  EKG without any ischemic changes.  Had been having some nausea and hiccups.  History of CVA Continue aspirin  and ezetimibe .  . Patient declined Plavix  in the past which was initially was for treatment for CAD.    HFrEF 30 to 35% Essential hypertension Has chronic dyspnea with elevated BNP.  Reported compliance with diuretics at home.  Chest x-ray without any vascular congestion.  Intolerance to Jardiance .  Continue Coreg  3.125 twice daily at home including torsemide  daily.   2D echocardiogram with LV ejection fraction of 30 to 35%.  Appears compensated.  History of CAD s/p LCx stent  Continue Coreg , aspirin  and ezetimibe .     History of ventricular tachycardia status post ICD History of frequent PVC - Continue Coreg .  Amiodarone  has been discontinued by cardiology given patient has an ICD placed.  Check 2D echocardiogram with LV ejection fraction of 30 to 35% with grade 1 diastolic dysfunction.  Moderate hypokinesis of the left ventricular apical segment..    History of OSA Class I obesity.Body mass index is 33.91 kg/m.  Did not want CPAP in the past.  Would benefit from ongoing weight loss as outpatient.   History of generalized anxiety disorder Was prescribed Zoloft  but has not taken recently.   Non-insulin -dependent DM type II -History of intolerance to Jardiance  due to fatigue.  Latest hemoglobin A1c 6.4 diet managed. Continue heart healthy carb modified diet and check POC blood glucose with low sliding scale insulin .   History of COPD -Continue  Dulera  twice daily and DuoNeb as needed.  DVT prophylaxis: enoxaparin  (LOVENOX ) injection 40 mg Start: 11/01/23 1845 SCDs Start: 10/31/23 0210 Place TED hose Start: 10/31/23 0210   Disposition: CIR as per PT evaluation.  Medically stable for disposition.  Status is: Inpatient  Remains inpatient appropriate because: Need for rehabilitation, status post  stroke,     Code Status:     Code Status: Full Code  Family Communication: Not at bedside  Consultants: Neurology  Procedures: None  Anti-infectives:  None  Anti-infectives (From admission, onward)    None       Subjective: Today, patient was seen and examined at bedside.  Patient denies any chest pain, shortness of breath, dizziness, lightheadedness.  Has had a bowel movement this morning.   Objective: Vitals:   11/03/23 0723 11/03/23 0833  BP: 124/67   Pulse: 74   Resp: 17   Temp: 98 F (36.7 C)   SpO2: 92% 92%    Intake/Output Summary (Last 24 hours) at 11/03/2023 1134 Last data filed at 11/03/2023 0900 Gross per 24 hour  Intake 360 ml  Output --  Net 360 ml   Filed Weights   10/31/23 1100  Weight: 95.3 kg   Body mass index is 33.91 kg/m.   Physical Exam: GENERAL: Patient is alert awake and oriented. Not in obvious distress.  Obese. HENT: No scleral pallor or icterus. Pupils equally reactive to light. Oral mucosa is moist NECK: is supple, no gross swelling noted. CHEST: Clear to auscultation. No crackles or wheezes.   CVS: S1 and S2 heard, no murmur. Regular rate and rhythm.  ABDOMEN: Soft, non-tender, bowel sounds are present. EXTREMITIES: No edema. CNS: Cranial nerves are intact. No focal motor deficits. SKIN: warm and dry without rashes.  Data Review: I have personally reviewed the following laboratory data and studies,  CBC: Recent Labs  Lab 10/30/23 1759 10/31/23 0605 11/02/23 0544 11/03/23 0612  WBC 13.8* 13.3* 11.5* 13.9*  HGB 18.5* 16.5 16.8 17.0  HCT 52.2* 47.7 48.6 48.9  MCV 84.2 85.8 85.3 84.3  PLT 236 220 197 213   Basic Metabolic Panel: Recent Labs  Lab 10/30/23 1759 10/31/23 0605 11/02/23 0544 11/03/23 0612  NA 135 139 135 138  K 4.1 4.0 3.6 3.8  CL 103 106 102 103  CO2 18* 22 22 23   GLUCOSE 207* 131* 171* 141*  BUN 17 19 24* 20  CREATININE 1.02 1.17 1.27* 1.23  CALCIUM  9.5 9.2 8.7* 8.9  MG  --   --  2.0 2.0    Liver Function Tests: Recent Labs  Lab 10/31/23 0605  AST 16  ALT 11  ALKPHOS 61  BILITOT 1.4*  PROT 6.7  ALBUMIN 3.4*   No results for input(s): "LIPASE", "AMYLASE" in the last 168 hours. No results for input(s): "AMMONIA" in the last 168 hours. Cardiac Enzymes: No results for input(s): "CKTOTAL", "CKMB", "CKMBINDEX", "TROPONINI" in the last 168 hours. BNP (last 3 results) Recent Labs    07/13/23 1520 09/27/23 0956 10/30/23 1759  BNP 279.5* 603.3* 328.2*    ProBNP (last 3 results) No results for input(s): "PROBNP" in the last 8760 hours.  CBG: Recent Labs  Lab 11/02/23 0620 11/02/23 1235 11/02/23 1629 11/02/23 2122 11/03/23 0617  GLUCAP 173* 167* 195* 274* 159*   Recent Results (from the past 240 hours)  Resp panel by RT-PCR (RSV, Flu A&B, Covid) Anterior Nasal Swab     Status: None   Collection Time: 10/30/23  6:39 PM   Specimen: Anterior  Nasal Swab  Result Value Ref Range Status   SARS Coronavirus 2 by RT PCR NEGATIVE NEGATIVE Final    Comment: (NOTE) SARS-CoV-2 target nucleic acids are NOT DETECTED.  The SARS-CoV-2 RNA is generally detectable in upper respiratory specimens during the acute phase of infection. The lowest concentration of SARS-CoV-2 viral copies this assay can detect is 138 copies/mL. A negative result does not preclude SARS-Cov-2 infection and should not be used as the sole basis for treatment or other patient management decisions. A negative result may occur with  improper specimen collection/handling, submission of specimen other than nasopharyngeal swab, presence of viral mutation(s) within the areas targeted by this assay, and inadequate number of viral copies(<138 copies/mL). A negative result must be combined with clinical observations, patient history, and epidemiological information. The expected result is Negative.  Fact Sheet for Patients:  BloggerCourse.com  Fact Sheet for Healthcare Providers:   SeriousBroker.it  This test is no t yet approved or cleared by the [place]  FDA and  has been authorized for detection and/or diagnosis of SARS-CoV-2 by FDA under an Emergency Use Authorization (EUA). This EUA will remain  in effect (meaning this test can be used) for the duration of the COVID-19 declaration under Section 564(b)(1) of the Act, 21 U.S.C.section 360bbb-3(b)(1), unless the authorization is terminated  or revoked sooner.       Influenza A by PCR NEGATIVE NEGATIVE Final   Influenza B by PCR NEGATIVE NEGATIVE Final    Comment: (NOTE) The Xpert Xpress SARS-CoV-2/FLU/RSV plus assay is intended as an aid in the diagnosis of influenza from Nasopharyngeal swab specimens and should not be used as a sole basis for treatment. Nasal washings and aspirates are unacceptable for Xpert Xpress SARS-CoV-2/FLU/RSV testing.  Fact Sheet for Patients: BloggerCourse.com  Fact Sheet for Healthcare Providers: SeriousBroker.it  This test is not yet approved or cleared by the [place]  FDA and has been authorized for detection and/or diagnosis of SARS-CoV-2 by FDA under an Emergency Use Authorization (EUA). This EUA will remain in effect (meaning this test can be used) for the duration of the COVID-19 declaration under Section 564(b)(1) of the Act, 21 U.S.C. section 360bbb-3(b)(1), unless the authorization is terminated or revoked.     Resp Syncytial Virus by PCR NEGATIVE NEGATIVE Final    Comment: (NOTE) Fact Sheet for Patients: BloggerCourse.com  Fact Sheet for Healthcare Providers: SeriousBroker.it  This test is not yet approved or cleared by the [place]  FDA and has been authorized for detection and/or diagnosis of SARS-CoV-2 by FDA under an Emergency Use Authorization (EUA). This EUA will remain in effect (meaning this test can be used) for  the duration of the COVID-19 declaration under Section 564(b)(1) of the Act, 21 U.S.C. section 360bbb-3(b)(1), unless the authorization is terminated or revoked.  Performed at [facility], [address] [place], [place] [address]      Studies: No results found.     Rosena Conradi, MD  [facility] 11/03/2023  If 7PM-7AM, please contact night-coverage

## 2023-11-04 ENCOUNTER — Inpatient Hospital Stay (HOSPITAL_COMMUNITY)
Admission: AD | Admit: 2023-11-04 | Discharge: 2023-11-04 | DRG: 057 | Disposition: A | Discharge status: Against Medical Advice | Source: Intra-hospital | Attending: Physical Medicine and Rehabilitation | Admitting: Physical Medicine and Rehabilitation

## 2023-11-04 DIAGNOSIS — I69398 Other sequelae of cerebral infarction: Secondary | ICD-10-CM | POA: Diagnosis present

## 2023-11-04 DIAGNOSIS — I639 Cerebral infarction, unspecified: Secondary | ICD-10-CM | POA: Diagnosis not present

## 2023-11-04 DIAGNOSIS — R42 Dizziness and giddiness: Secondary | ICD-10-CM | POA: Diagnosis present

## 2023-11-04 DIAGNOSIS — R079 Chest pain, unspecified: Secondary | ICD-10-CM | POA: Diagnosis present

## 2023-11-04 DIAGNOSIS — Z7982 Long term (current) use of aspirin: Secondary | ICD-10-CM | POA: Diagnosis not present

## 2023-11-04 DIAGNOSIS — M7989 Other specified soft tissue disorders: Secondary | ICD-10-CM | POA: Diagnosis present

## 2023-11-04 DIAGNOSIS — R2681 Unsteadiness on feet: Secondary | ICD-10-CM | POA: Diagnosis present

## 2023-11-04 DIAGNOSIS — Z833 Family history of diabetes mellitus: Secondary | ICD-10-CM

## 2023-11-04 DIAGNOSIS — E119 Type 2 diabetes mellitus without complications: Secondary | ICD-10-CM | POA: Diagnosis present

## 2023-11-04 DIAGNOSIS — E669 Obesity, unspecified: Secondary | ICD-10-CM | POA: Diagnosis present

## 2023-11-04 DIAGNOSIS — Z79899 Other long term (current) drug therapy: Secondary | ICD-10-CM | POA: Diagnosis not present

## 2023-11-04 DIAGNOSIS — Z6833 Body mass index (BMI) 33.0-33.9, adult: Secondary | ICD-10-CM

## 2023-11-04 DIAGNOSIS — I252 Old myocardial infarction: Secondary | ICD-10-CM

## 2023-11-04 DIAGNOSIS — J449 Chronic obstructive pulmonary disease, unspecified: Secondary | ICD-10-CM | POA: Diagnosis present

## 2023-11-04 DIAGNOSIS — G4730 Sleep apnea, unspecified: Secondary | ICD-10-CM | POA: Diagnosis present

## 2023-11-04 DIAGNOSIS — E785 Hyperlipidemia, unspecified: Secondary | ICD-10-CM | POA: Diagnosis present

## 2023-11-04 DIAGNOSIS — Z8249 Family history of ischemic heart disease and other diseases of the circulatory system: Secondary | ICD-10-CM

## 2023-11-04 DIAGNOSIS — Z955 Presence of coronary angioplasty implant and graft: Secondary | ICD-10-CM

## 2023-11-04 DIAGNOSIS — M5416 Radiculopathy, lumbar region: Secondary | ICD-10-CM | POA: Diagnosis present

## 2023-11-04 DIAGNOSIS — I651 Occlusion and stenosis of basilar artery: Secondary | ICD-10-CM | POA: Diagnosis present

## 2023-11-04 DIAGNOSIS — I255 Ischemic cardiomyopathy: Secondary | ICD-10-CM | POA: Diagnosis present

## 2023-11-04 DIAGNOSIS — I5032 Chronic diastolic (congestive) heart failure: Secondary | ICD-10-CM | POA: Diagnosis present

## 2023-11-04 DIAGNOSIS — Z87891 Personal history of nicotine dependence: Secondary | ICD-10-CM | POA: Diagnosis not present

## 2023-11-04 DIAGNOSIS — Z95810 Presence of automatic (implantable) cardiac defibrillator: Secondary | ICD-10-CM | POA: Diagnosis not present

## 2023-11-04 DIAGNOSIS — I110 Hypertensive heart disease with heart failure: Secondary | ICD-10-CM | POA: Diagnosis present

## 2023-11-04 DIAGNOSIS — Z5329 Procedure and treatment not carried out because of patient's decision for other reasons: Secondary | ICD-10-CM | POA: Diagnosis present

## 2023-11-04 DIAGNOSIS — I2510 Atherosclerotic heart disease of native coronary artery without angina pectoris: Secondary | ICD-10-CM | POA: Diagnosis present

## 2023-11-04 DIAGNOSIS — Z7984 Long term (current) use of oral hypoglycemic drugs: Secondary | ICD-10-CM | POA: Diagnosis not present

## 2023-11-04 LAB — GLUCOSE, CAPILLARY
Glucose-Capillary: 146 mg/dL — ABNORMAL HIGH (ref 70–99)
Glucose-Capillary: 167 mg/dL — ABNORMAL HIGH (ref 70–99)
Glucose-Capillary: 247 mg/dL — ABNORMAL HIGH (ref 70–99)
Glucose-Capillary: 200 mg/dL — ABNORMAL HIGH (ref 70–99)

## 2023-11-04 MED ORDER — DOCUSATE SODIUM 100 MG PO CAPS: 100.0000 mg | ORAL_CAPSULE | Freq: Two times a day (BID) | ORAL | Status: DC

## 2023-11-04 MED ORDER — CLOPIDOGREL BISULFATE 75 MG PO TABS: 75.0000 mg | ORAL_TABLET | Freq: Every day | ORAL | Status: DC

## 2023-11-04 MED ORDER — ACETAMINOPHEN 325 MG PO TABS: 650.0000 mg | ORAL_TABLET | Freq: Four times a day (QID) | ORAL | Status: DC | PRN

## 2023-11-04 MED ORDER — POLYETHYLENE GLYCOL 3350 17 G PO PACK: 17.0000 g | PACK | Freq: Every day | ORAL | Status: DC

## 2023-11-04 MED ORDER — INSULIN ASPART 100 UNIT/ML IJ SOLN
0.0000 [IU] | Freq: Every day | INTRAMUSCULAR | Status: DC
Start: 2023-11-04 — End: 2023-11-30

## 2023-11-04 MED ORDER — EZETIMIBE 10 MG PO TABS: 10.0000 mg | ORAL_TABLET | Freq: Every day | ORAL | Status: DC

## 2023-11-04 MED ORDER — MECLIZINE HCL 12.5 MG PO TABS: 12.5000 mg | ORAL_TABLET | Freq: Three times a day (TID) | ORAL | Status: DC | PRN

## 2023-11-04 MED ORDER — INSULIN ASPART 100 UNIT/ML IJ SOLN
0.0000 [IU] | Freq: Three times a day (TID) | INTRAMUSCULAR | Status: DC
Start: 2023-11-04 — End: 2023-12-01

## 2023-11-04 NOTE — Progress Notes (Signed)
 Physical Therapy Treatment Patient Details Name: [name]. MRN: [id number] DOB: 05/22/1956 Today's Date: 11/04/2023   History of Present Illness Patient is 68 y.o. male presented to [facility] 10/30/23 with multiple complaints including dizziness, lightheadedness ataxia shortness of breath chills vomiting and difficulty concentrating.  Pt recently admitted 3/25-3/28 for CHF and dc'd on home O2. CT of head negative, MRI of the brain revealed acute punctate infarct in the cerebellar vermis; MRA  Mild basilar artery stenosis. PMH significant for OA, CAD, HLD, HTN, PVC's, Multiple Stokes, DMII.    PT Comments  Pt tolerated treatment well today. Pt today was able to ambulate in hallway with CGA/Min A no AD. Pt also able to perform DGI scoring 14/24 indicating that he is a high fall risk. No change in DC/DME recs at this time. PT will continue to follow.    If plan is discharge home, recommend the following: A lot of help with walking and/or transfers;Assistance with cooking/housework;Help with stairs or ramp for entrance;Assist for transportation;Supervision due to cognitive status   Can travel by private vehicle        Equipment Recommendations  Rolling walker (2 wheels)    Recommendations for Other Services       Precautions / Restrictions Precautions Precautions: Fall Recall of Precautions/Restrictions: Intact Restrictions Weight Bearing Restrictions Per Provider Order: No     Mobility  Bed Mobility Overal bed mobility: Modified Independent Bed Mobility: Supine to Sit     Supine to sit: Modified independent (Device/Increase time)          Transfers Overall transfer level: Needs assistance Equipment used: None Transfers: Sit to/from Stand Sit to Stand: Contact guard assist           General transfer comment: CGA for safety    Ambulation/Gait Ambulation/Gait assistance: Contact guard assist, Min assist Gait Distance (Feet): 200 Feet Assistive device: 1 person hand  held assist Gait Pattern/deviations: Step-through pattern, Decreased step length - right, Decreased step length - left, Decreased stride length, Shuffle, Staggering left, Drifts right/left, Trunk flexed, Staggering right Gait velocity: Quick movements, pt has to be reminded to slow down.     General Gait Details: Pt was a little unsteady reaching for external support.  Pt has 1 LOB when reaching for door requiring Min A to correct. Able to perform DGI.   Stairs Stairs: Yes Stairs assistance: Contact guard assist Stair Management: Two rails, Forwards, Alternating pattern Number of Stairs: 2 General stair comments: Cues to use rails and slow down.   Wheelchair Mobility     Tilt Bed    Modified Rankin (Stroke Patients Only) Modified Rankin (Stroke Patients Only) Pre-Morbid Rankin Score: No symptoms Modified Rankin: Slight disability     Balance Overall balance assessment: Needs assistance Sitting-balance support: No upper extremity supported, Feet supported Sitting balance-Leahy Scale: Good Sitting balance - Comments: Able to sit EOB and reach out of BOS   Standing balance support: During functional activity, Single extremity supported, Bilateral upper extremity supported Standing balance-Leahy Scale: Poor Standing balance comment: reliant on support from therapist or external support                 Standardized Balance Assessment Standardized Balance Assessment : Dynamic Gait Index   Dynamic Gait Index Level Surface: Mild Impairment Change in Gait Speed: Moderate Impairment Gait with Horizontal Head Turns: Moderate Impairment Gait with Vertical Head Turns: Mild Impairment Gait and Pivot Turn: Mild Impairment Step Over Obstacle: Mild Impairment Step Around Obstacles: Mild Impairment Steps: Mild  Impairment Total Score: 14      Communication Communication Communication: No apparent difficulties  Cognition Arousal: Alert Behavior During Therapy: WFL for  tasks assessed/performed, Impulsive   PT - Cognitive impairments: No apparent impairments, Awareness, Safety/Judgement, No family/caregiver present to determine baseline   Orientation impairments: Time                   PT - Cognition Comments: Slightly impulsive with poor safety awareness. Following commands: Intact      Cueing Cueing Techniques: Verbal cues, Visual cues  Exercises      General Comments General comments (skin integrity, edema, etc.): VSS      Pertinent Vitals/Pain Pain Assessment Pain Assessment: No/denies pain    Home Living                          Prior Function            PT Goals (current goals can now be found in the care plan section) Progress towards PT goals: Progressing toward goals    Frequency    Min 3X/week      PT Plan      Co-evaluation              AM-PAC PT "6 Clicks" Mobility   Outcome Measure  Help needed turning from your back to your side while in a flat bed without using bedrails?: A Little Help needed moving from lying on your back to sitting on the side of a flat bed without using bedrails?: A Little Help needed moving to and from a bed to a chair (including a wheelchair)?: A Little Help needed standing up from a chair using your arms (e.g., wheelchair or bedside chair)?: A Little Help needed to walk in hospital room?: A Little Help needed climbing 3-5 steps with a railing? : A Lot 6 Click Score: 17    End of Session Equipment Utilized During Treatment: Gait belt Activity Tolerance: Patient tolerated treatment well Patient left: in bed;with call bell/phone within reach Nurse Communication: Mobility status PT Visit Diagnosis: Other abnormalities of gait and mobility (R26.89);Muscle weakness (generalized) (M62.81);Difficulty in walking, not elsewhere classified (R26.2);Other symptoms and signs involving the nervous system (R29.898)     Time: 4696-2952 PT Time Calculation (min) (ACUTE  ONLY): 11 min  Charges:    $Gait Training: 8-22 mins PT General Charges $$ ACUTE PT VISIT: 1 Visit                     Rodgers Clack, PT, DPT Acute Rehab Services [phone]    Zailey Audia 11/04/2023, 3:02 PM

## 2023-11-04 NOTE — H&P (Addendum)
 Physical Medicine and Rehabilitation Admission H&P       HPI: [name], Threats. is a 68 year old right-handed male with history significant for diabetes mellitus, diastolic congestive heart failure, ischemic cardiomyopathy with ejection fraction of 30 to 35%, V. tach status post AICD, CAD status post stenting, chronic PVC, COPD quit smoking 12 years ago.   Per chart review patient lives alone.  1 level home 2 steps to enter.  Reportedly independent prior to previous admission.  Presented 10/31/2023 with unsteady gait lightheadedness and orthostasis as well as shortness of breath and bouts of vomiting.  CT/MRI showed punctate acute infarct in the cerebellar vermis with moderate to severe chronic small vessel ischemic disease mild basilar artery stenosis.  MRA otherwise was unremarkable.  Patient did not receive TNK.  Admission chemistries unremarkable except glucose 207, WBC 13,800, BNP 328.  Echocardiogram ejection fraction of 30 to 35% left ventricle demonstrating regional wall motion abnormality.  Grade 1 diastolic dysfunction.  She continued on aspirin  and Plavix  for CVA prophylaxis x 3 weeks then Plavix  alone.  Lovenox  for DVT prophylaxis.  Therapy evaluations completed due to patient's decreased functional mobility was admitted for a comprehensive rehab program.   Review of Systems  Constitutional:  Negative for chills and fever.  HENT:  Negative for hearing loss.   Eyes:  Negative for blurred vision and double vision.  Respiratory:  Positive for shortness of breath. Negative for cough and wheezing.   Cardiovascular:  Negative for chest pain, palpitations and leg swelling.  Gastrointestinal:  Positive for constipation, nausea and vomiting. Negative for heartburn.  Genitourinary:  Negative for dysuria, flank pain and hematuria.  Musculoskeletal:  Positive for joint pain and myalgias.  Skin:  Negative for rash.  Neurological:  Positive for dizziness, weakness and headaches.  All other  systems reviewed and are negative.       Past Medical History:  Diagnosis Date   Anginal pain (HCC)     Arthritis     CAD (coronary artery disease)      a. s/p MI with LAD and Diag stenting @ [name];  b. 06/2015 Cath: LAD 44m/d ISR, 100 RCA (ISR) w/ L->R collats, otw mod nonobs dzs-->Med Rx; c. 08/2019 Cath: LM nl, LAD 10p/m ISR, D1 20, D2 100, RI min irregs, LCX 52m/d, OM1 100, OM2 50, RCA 100p, 70d. RPDA fills via collats from LAD. EF 25-35%-->Med Rx; d. 10/2019 NSTEMI/Cath: LCX now 100 (2.75x15 Resolute Onyx DES), otw stable compared to 08/2019.   Chronic combined systolic and diastolic CHF (congestive heart failure) (HCC)      a. 06/2015 Echo: EF 20-25%, Gr3 DD; b. 10/2019 Echo: EF 25-30%, glob HK, sev inf/infapical HK. Mod dil LA.   Dyspnea     Essential hypertension     Headache 12/23/2021   Hyperlipidemia     Hypokalemia      a. 06/2015 in setting of diuresis.   Ischemic cardiomyopathy      a. 2011 EF 45% ([name]);  b. 06/2015 Echo: EF 20-25%; c. 04/2019 Echo: EF 40-45%; d. 08/2019 LV gram: EF 25-35%; e. 10/2019 Echo: EF 25-30%.   PVC's (premature ventricular contractions)      a. 09/2019 Zio (3 days): Avg hr 70, 4 runs NSVT, 5 runs SVT, rare PACs, frequent PVCs w/ 11.8% burden.   Sleep apnea     Stroke [facility])     Stroke/Right temporal lobe infarction [facility])      a. 10/2019 MRI brain: 1cm acute ischemic nonhemorrhagic  R temporal lobe infarct. Age-related cerebral atrophy w/ moderate chronic small vessel ischemic dzs.   Type 2 diabetes mellitus with hyperglycemia (HCC) 10/08/2019             Past Surgical History:  Procedure Laterality Date   BACK SURGERY   04/2019   CARDIAC CATHETERIZATION N/A 06/10/2015    Procedure: Left Heart Cath;  Surgeon: Wenona Hamilton, MD;  Location: [facility] INVASIVE CV LAB;  Service: Cardiovascular;  Laterality: N/A;   CORONARY STENT INTERVENTION N/A 10/08/2019    Procedure: CORONARY STENT INTERVENTION;  Surgeon: Wenona Hamilton, MD;  Location: [facility] INVASIVE CV  LAB;  Service: Cardiovascular;  Laterality: N/A;   CORONARY STENT PLACEMENT       ICD IMPLANT N/A 04/07/2022    Procedure: ICD IMPLANT;  Surgeon: Boyce Byes, MD;  Location: [facility] INVASIVE CV LAB;  Service: Cardiovascular;  Laterality: N/A;   LEFT HEART CATH AND CORONARY ANGIOGRAPHY N/A 10/08/2019    Procedure: LEFT HEART CATH AND CORONARY ANGIOGRAPHY poss pci;  Surgeon: Wenona Hamilton, MD;  Location: [facility] INVASIVE CV LAB;  Service: Cardiovascular;  Laterality: N/A;   RIGHT HEART CATH AND CORONARY ANGIOGRAPHY N/A 12/30/2021    Procedure: RIGHT HEART CATH AND CORONARY ANGIOGRAPHY;  Surgeon: Sammy Crisp, MD;  Location: [facility] INVASIVE CV LAB;  Service: Cardiovascular;  Laterality: N/A;   RIGHT/LEFT HEART CATH AND CORONARY ANGIOGRAPHY N/A 08/20/2019    Procedure: RIGHT/LEFT HEART CATH AND CORONARY ANGIOGRAPHY;  Surgeon: Wenona Hamilton, MD;  Location: [facility] INVASIVE CV LAB;  Service: Cardiovascular;  Laterality: N/A;             Family History  Problem Relation Age of Onset   Coronary artery disease Mother     Diabetes Mother     Coronary artery disease Father          Social History:  reports that he quit smoking about 12 years ago. His smoking use included cigarettes. He has never used smokeless tobacco. He reports that he does not drink alcohol and does not use drugs. Allergies:  Allergies       Allergies  Allergen Reactions   Contrast Media [Iodinated Contrast Media] Shortness Of Breath   Iohexol  Shortness Of Breath       Onset Date: [id number]     Jardiance  [Empagliflozin ] Other (See Comments)      Fatigue/weakness   Atorvastatin  Other (See Comments)      Myalgias     Hydrocodone  Itching   Morphine And Codeine Other (See Comments)      Lost control    Penicillins Other (See Comments)      Unknown reaction   Rosuvastatin  Other (See Comments)      Myalgias              Medications Prior to Admission  Medication Sig Dispense Refill   albuterol  (VENTOLIN  HFA) 108  (90 Base) MCG/ACT inhaler Inhale 2 puffs into the lungs every 6 (six) hours as needed for wheezing or shortness of breath. 54 g 2   aspirin  EC 81 MG tablet Take 1 tablet (81 mg total) by mouth daily. Swallow whole. 90 tablet 3   carvedilol  (COREG ) 3.125 MG tablet Take 1 tablet (3.125 mg total) by mouth 2 (two) times daily with a meal. 120 tablet 0   dapagliflozin  propanediol (FARXIGA ) 10 MG TABS tablet Take 1 tablet (10 mg total) by mouth daily before breakfast. 30 tablet 6   nitroGLYCERIN  (NITROSTAT ) 0.4 MG SL tablet Place 1 tablet (0.4  mg total) under the tongue every 5 (five) minutes as needed for chest pain. 90 tablet 3   sacubitril -valsartan  (ENTRESTO ) 24-26 MG Take 1 tablet by mouth 2 (two) times daily. (Patient taking differently: Take 1 tablet by mouth daily.) 120 tablet 0   Tiotropium Bromide-Olodaterol (STIOLTO RESPIMAT ) 2.5-2.5 MCG/ACT AERS Inhale 2 puffs into the lungs daily.       torsemide  (DEMADEX ) 20 MG tablet Take 20 mg by mouth daily.                  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, lives alone, 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 ADLs Comments: independent   Functional Status:  Mobility: Bed Mobility Overal bed mobility: Modified Independent Bed Mobility: Supine to Sit Rolling: Supervision Supine to sit: Modified independent (Device/Increase time) Sit to supine: Supervision Transfers Overall transfer level: Needs  assistance Equipment used: Rolling walker (2 wheels) Transfers: Sit to/from Stand Sit to Stand: Contact guard assist Ambulation/Gait Ambulation/Gait assistance: Min assist Gait Distance (Feet): 200 Feet Assistive device: 1 person hand held assist (Refused RW) Gait Pattern/deviations: Step-through pattern, Decreased step length - right, Decreased step length - left, Decreased stride length, Shuffle, Staggering left, Drifts right/left, Trunk flexed, Staggering right General Gait Details: Pt was a little unsteady reaching for external support. Pt reporting residual dizziness from OT session as well as nausea. Pt has 1 LOB when reaching for door requiring Min A to correct. Gait velocity: Quick movements, pt has to be reminded to slow down. Gait velocity interpretation: <1.8 ft/sec, indicate of risk for recurrent falls Stairs: Yes Stairs assistance: Contact guard assist Stair Management: Two rails, Forwards, Alternating pattern Number of Stairs: 2 General stair comments: Cues to use rails and slow down.   ADL: ADL Overall ADL's : Needs assistance/impaired Eating/Feeding: Independent Grooming: Wash/dry hands, Wash/dry face, Oral care, Supervision/safety Upper Body Bathing: Supervision/ safety, Standing Lower Body Bathing: Sit to/from stand, Supervison/ safety Upper Body Dressing : Supervision/safety, Sitting Lower Body Dressing: Supervision/safety, Sit to/from stand Toilet Transfer: Supervision/safety Functional mobility during ADLs: Supervision/safety General ADL Comments: Patient is performing adls at a supervision level with a wide BOS.  His balance deteriorates as the activity goes on   Cognition: Cognition Orientation Level: Oriented to person, Oriented to place, Oriented to situation, Disoriented to time Cognition Arousal: Alert Behavior During Therapy: [facility] for tasks assessed/performed   Physical Exam: Blood pressure 129/79, pulse 63, temperature 98.2 F (36.8 C), temperature  source Oral, resp. rate 18, height 5\' 6"  (1.676 m), weight 95.3 kg, SpO2 91%.  Physical Exam Constitutional: No apparent distress. Appropriate appearance for age.  Sitting up in bedside chair. HENT: No JVD. Neck Supple. Trachea midline. Atraumatic, normocephalic. Eyes: PERRLA. EOMI. Visual fields grossly intact. + Vertigo with left lateral and inferior gaze  Cardiovascular: RRR, no murmurs/rub/gallops. No Edema. Peripheral pulses 2+  Respiratory: CTAB. No rales, rhonchi, or wheezing. On RA.  Abdomen: + bowel sounds, normoactive.  Mild distention, no tenderness Skin: Prior IV site in left antecubital area red, swollen, and slightly tender.  No obvious warmth, no drainage.  Otherwise, grossly intact.  MSK:      No apparent deformity.  Limited range of motion in right shoulder abduction to 100 degrees.      no TTP with sternal palpation.  Neurologic exam:  Cognition: AAO to person, place, time and event.  Language: Fluent, No substitutions or neoglisms. No dysarthria. Names 3/3 objects correctly.  Memory: Recalls 3/3 objects at 5 minutes. No apparent deficits  Insight: Good insight into current condition.  Mood: Pleasant affect, appropriate mood.  Sensation: Reduced to light touch in left lower extremity second toe extending proximally to the lateral thigh. Reflexes: 2+ in BL UE and LEs. Negative Hoffman's and babinski signs bilaterally.  CN: 2-12 grossly intact.  Coordination: No apparent tremors. No ataxia on FTN, HTS bilaterally.  Spasticity: MAS 0 in all extremities.   Strength: Approximately equal, 5 out of 5 in bilateral upper and lower extremities       Lab Results Last 48 Hours        Results for orders placed or performed during the hospital encounter of 10/31/23 (from the past 48 hours)  Glucose, capillary     Status: Abnormal    Collection Time: 11/02/23  4:29 PM  Result Value Ref Range    Glucose-Capillary 195 (H) 70 - 99 mg/dL      Comment: Glucose reference range  applies only to samples taken after fasting for at least 8 hours.    Comment 1 Notify RN    Glucose, capillary     Status: Abnormal    Collection Time: 11/02/23  9:22 PM  Result Value Ref Range    Glucose-Capillary 274 (H) 70 - 99 mg/dL      Comment: Glucose reference range applies only to samples taken after fasting for at least 8 hours.    Comment 1 Notify RN    CBC     Status: Abnormal    Collection Time: 11/03/23  6:12 AM  Result Value Ref Range    WBC 13.9 (H) 4.0 - 10.5 K/uL    RBC 5.80 4.22 - 5.81 MIL/uL    Hemoglobin 17.0 13.0 - 17.0 g/dL    HCT 82.9 56.2 - 13.0 %    MCV 84.3 80.0 - 100.0 fL    MCH 29.3 26.0 - 34.0 pg    MCHC 34.8 30.0 - 36.0 g/dL    RDW 86.5 78.4 - 69.6 %    Platelets 213 150 - 400 K/uL    nRBC 0.0 0.0 - 0.2 %      Comment: Performed at [facility], [address] [place], [place] [address]  Basic metabolic panel with GFR     Status: Abnormal    Collection Time: 11/03/23  6:12 AM  Result Value Ref Range    Sodium 138 135 - 145 mmol/L    Potassium 3.8 3.5 - 5.1 mmol/L    Chloride 103 98 - 111 mmol/L    CO2 23 22 - 32 mmol/L    Glucose, Bld 141 (H) 70 - 99 mg/dL      Comment: Glucose reference range applies only to samples taken after fasting for at least 8 hours.    BUN 20 8 - 23 mg/dL    Creatinine, Ser 4.13 0.61 - 1.24 mg/dL    Calcium  8.9 8.9 - 10.3 mg/dL    GFR, Estimated >24 >40 mL/min      Comment: (NOTE) Calculated using the CKD-EPI Creatinine Equation (2021)      Anion gap 12 5 -  15      Comment: Performed at [facility], [address] [place], [place] [address]  Magnesium      Status: None    Collection Time: 11/03/23  6:12 AM  Result Value Ref Range    Magnesium  2.0 1.7 - 2.4 mg/dL      Comment: Performed at [facility], [address], [place], [place] [address]  Glucose, capillary     Status: Abnormal    Collection Time: 11/03/23  6:17 AM  Result Value Ref Range    Glucose-Capillary 159 (H) 70 - 99 mg/dL       Comment: Glucose reference range applies only to samples taken after fasting for at least 8 hours.    Comment 1 Notify RN    Glucose, capillary     Status: Abnormal    Collection Time: 11/03/23 11:58 AM  Result Value Ref Range    Glucose-Capillary 215 (H) 70 - 99 mg/dL      Comment: Glucose reference range applies only to samples taken after fasting for at least 8 hours.  Glucose, capillary     Status: Abnormal    Collection Time: 11/03/23  4:02 PM  Result Value Ref Range    Glucose-Capillary 191 (H) 70 - 99 mg/dL      Comment: Glucose reference range applies only to samples taken after fasting for at least 8 hours.  Glucose, capillary     Status: Abnormal    Collection Time: 11/03/23  9:28 PM  Result Value Ref Range    Glucose-Capillary 293 (H) 70 - 99 mg/dL      Comment: Glucose reference range applies only to samples taken after fasting for at least 8 hours.    Comment 1 Notify RN      Comment 2 Document in Chart    Glucose, capillary     Status: Abnormal    Collection Time: 11/04/23  6:04 AM  Result Value Ref Range    Glucose-Capillary 146 (H) 70 - 99 mg/dL      Comment: Glucose reference range applies only to samples taken after fasting for at least 8 hours.    Comment 1 Notify RN      Comment 2 Document in Chart    Glucose, capillary     Status: Abnormal    Collection Time: 11/04/23  8:36 AM  Result Value Ref Range    Glucose-Capillary 167 (H) 70 - 99 mg/dL      Comment: Glucose reference range applies only to samples taken after fasting for at least 8 hours.    Comment 1 Notify RN      Comment 2 Document in Chart    Glucose, capillary     Status: Abnormal    Collection Time: 11/04/23 11:34 AM  Result Value Ref Range    Glucose-Capillary 247 (H) 70 - 99 mg/dL      Comment: Glucose reference range applies only to samples taken after fasting for at least 8 hours.    Comment 1 Notify RN      Comment 2 Document in Chart        Imaging Results (Last 48 hours)  No results  found.         Blood pressure 129/79, pulse 63, temperature 98.2 F (36.8 C), temperature source Oral, resp. rate 18, height 5\' 6"  (1.676 m), weight 95.3 kg, SpO2 91%.   Medical Problem List and Plan: 1. Functional deficits secondary to punctate infarct in the cerebellar vermis             -  patient may shower             -ELOS/Goals: 10-14 days, Min A PT/OT  - Stable to admit to IRF  2.  Antithrombotics: -DVT/anticoagulation:  Pharmaceutical: Lovenox              -antiplatelet therapy: Aspirin  81 mg daily and Plavix  75 mg day x 3 weeks then Plavix  3. Pain Management: Tylenol  as needed.    - History left lumbar radiculopathy, ongoing neuropathic pain.  4. Mood/Behavior/Sleep: Provide emotional support             -antipsychotic agents: N/A 5. Neuropsych/cognition: This patient is capable of making decisions on her own behalf. 6. Skin/Wound Care: Routine skin checks   - LUE swollen and red prior IV site; no warmth, will get US  to assess for SVT  7. Fluids/Electrolytes/Nutrition: Routine in and outs with follow-up chemistries 8.  Hypertension.  Coreg  3.125 mg twice daily.  Monitor with increased mobility 9.  Diastolic congestive heart care/ischemic cardiomyopathy.  Echocardiogram ejection fraction 30 to 35%.  Continue Demadex  20 mg daily.  Amiodarone  recently discontinued per cardiology services/Dr. Jesse Moritz..  Monitor for any signs of fluid overload 10.  COPD/remote tobacco use.  Continue inhalers.  Check oxygen saturations every shift 11.  CAD status post stent status post ICD.  Farxiga  and Entresto  currently on hold.  Continue as needed   - Patient endorsing intermittent chest pain, at rest, not reproducible on palpation; not currently present on exam.  Will get repeat EKG on admission to rehab . 12.  Non-insulin -dependent diabetes mellitus.  Hemoglobin A1c 6.4.  SSI.  Farxiga  on hold.  Resume as needed 13.  Obesity.  BMI 33.91.  Dietary follow-up 14.  Vertigo.  Secondary to  cerebellar stroke as above.  Has as needed meclizine  12.5 mg; add scopolamine patch.  Everlyn Hockey Angiulli, PA-C 11/04/2023  I have examined the patient independently and edited the note for HPI, ROS, exam, assessment, and plan as appropriate. I am in agreement with the above recommendations.   Bea Lime, DO 11/04/2023    Addendum: Shortly after admission to inpatient rehab, received a call from nursing regarding patient becoming "aggressive with staff" after being informed that he needed to get nursing assistance to go to the bathroom.  Patient immediately called a family member to pick him up, and demanded to leave AMA, indicating that rehab was similar to a prison that we were holding him against as will.  He was not amenable to redirection or reconsideration by staff.  Patient has cognitive capacity to make medical decisions, and was thus released per his request after acknowledging the risks associated with this decision, such as injury or death, and signing appropriate paperwork.

## 2023-11-04 NOTE — Plan of Care (Signed)
  Problem: Education: Goal: Knowledge of General Education information will improve Description: Including pain rating scale, medication(s)/side effects and non-pharmacologic comfort measures Outcome: Progressing   Problem: Clinical Measurements: Goal: Will remain free from infection Outcome: Progressing   Problem: Clinical Measurements: Goal: Diagnostic test results will improve Outcome: Progressing   Problem: Clinical Measurements: Goal: Cardiovascular complication will be avoided Outcome: Progressing   Problem: Activity: Goal: Risk for activity intolerance will decrease Outcome: Progressing   Problem: Nutrition: Goal: Adequate nutrition will be maintained Outcome: Progressing   Problem: Elimination: Goal: Will not experience complications related to bowel motility Outcome: Progressing   Problem: Safety: Goal: Ability to remain free from injury will improve Outcome: Progressing   Problem: Pain Managment: Goal: General experience of comfort will improve and/or be controlled Outcome: Progressing

## 2023-11-04 NOTE — Progress Notes (Signed)
 Signed     Expand All Collapse All PMR Admission Coordinator Pre-Admission Assessment   Patient: [name]. is an 68 y.o., male MRN: [id number] DOB: 1955-07-15 Height: 5\' 6"  (167.6 cm) Weight: 95.3 kg   Insurance Information HMO:     PPO:      PCP:      IPA:      80/20:      OTHER:  PRIMARY: [place] Medicaid healthy blue       Policy#: [id number]      Subscriber: Pt CM Name: Leola Raisin      Phone#: [phone]     Fax#: [phone] Pre-Cert#: [id number]   Received insurance authorization on 11/02/23. Pt approved from 11/03/23-11/09/23    Employer:  Benefits:  Phone #: [phone]     Name:  Venson Ginger Date: 02/02/2022 to 07/04/2024 Deductible: $0 - does not have one OOP Max: $0 - does not have one CIR: 100% coverage SNF: 100% coverage Outpatient: $4 copay/visit Home Health: 100% coverage DME: 100% coverage Providers: in network  SECONDARY:       Policy#:      Phone#:    The "Data Collection Information Summary" for patients in Inpatient Rehabilitation Facilities with attached "Privacy Act Statement-Health Care Records" was provided and verbally reviewed with: Patient   Emergency Contact Information Contact Information       Name Relation Home Work Mobile    RUFE, [name] Son [phone]        HUMPHRIES,MISTY Daughter     [phone]         Other Contacts   None on File        Current Medical History  Patient Admitting Diagnosis: CVA History of Present Illness:  [name]. is a 68 y.o. male with history of HFrEF 30 to 35%, ischemic cardiomyopathy, s/p AICD, CVA, CAD s/p LCx stent, COPD, V. tach s/p AICD and chronic PVC who has been initially presented to emergency department at [facility] 10/31/23 with multiple complaint includes  dizziness, lightheadedness, ataxia shortness of breath, chill, vomiting and difficulty concentrating. Patient was recently admitted from 3/25 until 3/28 for CHF exacerbation..  He has a known history of an EF of 30 to 35% and has  had recurrent VT status post ICD placement.  Patient was discharged with oxygen, furosemide  40 mg, spironolactone .  He presented to the ED with worsening shortness of breath.Upon arrival to the ER, BP was 152/104 with respiratory rate of 23 and otherwise normal vital signs. Normal troponin 14 and 16. Respiratory panel unremarkable. Elevated BNP 328. CBC showing leukocytosis 13.8 stable H&H and normal platelet count. BMP showing low bicarb 18 otherwise unremarkable. EKG showed sinus rhythm with rate of 70 with occasional PVCs and left posterior fascicular block with PVCs. Chest x-ray no evidence of pulmonary edema.  Enlarged cardiac silhouette. Noncontrasted head CT scan revealed atrophy and chronic small vessel ischemic change with no acute intracranial normalities.MRI of the brain revealed acute punctate infarct in the cerebellar vermis; MRA Mild basilar artery stenosis. Transferred to [facility] on 10/31/23 for further management. Neurology recommends aspirin  81 mg daily and clopidogrel  75 mg daily for 3 weeks and then plavix  alone.  And Lovenox  for VTE prophylaxis. Pt. Seen by PT/OT and they recommend CIR to assist return to PLOF.    Complete NIHSS TOTAL: 0   Patient's medical record from [facility]  has been reviewed by the rehabilitation admission coordinator and physician.   Past Medical History  Past Medical History:  Diagnosis Date   Anginal pain (HCC)     Arthritis     CAD (coronary artery disease)      a. s/p MI with LAD and Diag stenting @ [name];  b. 06/2015 Cath: LAD 6m/d ISR, 100 RCA (ISR) w/ L->R collats, otw mod nonobs dzs-->Med Rx; c. 08/2019 Cath: LM nl, LAD 10p/m ISR, D1 20, D2 100, RI min irregs, LCX 97m/d, OM1 100, OM2 50, RCA 100p, 70d. RPDA fills via collats from LAD. EF 25-35%-->Med Rx; d. 10/2019 NSTEMI/Cath: LCX now 100 (2.75x15 Resolute Onyx DES), otw stable compared to 08/2019.   Chronic combined systolic and diastolic CHF (congestive  heart failure) (HCC)      a. 06/2015 Echo: EF 20-25%, Gr3 DD; b. 10/2019 Echo: EF 25-30%, glob HK, sev inf/infapical HK. Mod dil LA.   Dyspnea     Essential hypertension     Headache 12/23/2021   Hyperlipidemia     Hypokalemia      a. 06/2015 in setting of diuresis.   Ischemic cardiomyopathy      a. 2011 EF 45% ([name]);  b. 06/2015 Echo: EF 20-25%; c. 04/2019 Echo: EF 40-45%; d. 08/2019 LV gram: EF 25-35%; e. 10/2019 Echo: EF 25-30%.   PVC's (premature ventricular contractions)      a. 09/2019 Zio (3 days): Avg hr 70, 4 runs NSVT, 5 runs SVT, rare PACs, frequent PVCs w/ 11.8% burden.   Sleep apnea     Stroke [facility])     Stroke/Right temporal lobe infarction [facility])      a. 10/2019 MRI brain: 1cm acute ischemic nonhemorrhagic R temporal lobe infarct. Age-related cerebral atrophy w/ moderate chronic small vessel ischemic dzs.   Type 2 diabetes mellitus with hyperglycemia (HCC) 10/08/2019          Has the patient had major surgery during 100 days prior to admission? No   Family History   family history includes Coronary artery disease in his father and mother; Diabetes in his mother.   Current Medications  Current Medications    Current Facility-Administered Medications:    acetaminophen  (TYLENOL ) tablet 650 mg, 650 mg, Oral, Q6H PRN, 650 mg at 11/01/23 1238 **OR** acetaminophen  (TYLENOL ) suppository 650 mg, 650 mg, Rectal, Q6H PRN, Sundil, Subrina, MD   aspirin  EC tablet 81 mg, 81 mg, Oral, Daily, Sundil, Subrina, MD, 81 mg at 11/02/23 9323   carvedilol  (COREG ) tablet 3.125 mg, 3.125 mg, Oral, BID WC, Sundil, Subrina, MD, 3.125 mg at 11/02/23 5573   clopidogrel  (PLAVIX ) tablet 75 mg, 75 mg, Oral, Daily, Consuelo Denmark, MD, 75 mg at 11/02/23 2202   enoxaparin  (LOVENOX ) injection 40 mg, 40 mg, Subcutaneous, Q24H, Consuelo Denmark, MD, 40 mg at 11/01/23 1843   ezetimibe  (ZETIA ) tablet 10 mg, 10 mg, Oral, Daily, Sundil, Subrina, MD, 10 mg at 11/02/23 5427   fluticasone  furoate-vilanterol (BREO ELLIPTA )  200-25 MCG/ACT 1 puff, 1 puff, Inhalation, Daily, Sundil, Subrina, MD, 1 puff at 11/02/23 0623   insulin  aspart (novoLOG ) injection 0-5 Units, 0-5 Units, Subcutaneous, QHS, Sundil, Subrina, MD   insulin  aspart (novoLOG ) injection 0-6 Units, 0-6 Units, Subcutaneous, TID WC, Sundil, Subrina, MD, 2 Units at 11/02/23 1339   ipratropium-albuterol  (DUONEB) 0.5-2.5 (3) MG/3ML nebulizer solution 3 mL, 3 mL, Nebulization, Q4H PRN, Sundil, Subrina, MD   meclizine  (ANTIVERT ) tablet 12.5 mg, 12.5 mg, Oral, TID PRN, Sundil, Subrina, MD, 12.5 mg at 10/31/23 2146   nitroGLYCERIN  (NITROSTAT ) SL tablet 0.4 mg, 0.4 mg, Sublingual, Q5 min PRN, Sundil, Subrina, MD  prochlorperazine  (COMPAZINE ) injection 10 mg, 10 mg, Intravenous, Q6H PRN, Sundil, Subrina, MD, 10 mg at 10/31/23 2144   sodium chloride  flush (NS) 0.9 % injection 3 mL, 3 mL, Intravenous, Q12H, Sundil, Subrina, MD, 3 mL at 11/02/23 1012   torsemide  (DEMADEX ) tablet 20 mg, 20 mg, Oral, Daily, Sundil, Subrina, MD, 20 mg at 11/02/23 1610     Patients Current Diet:  Diet Order                  Diet heart healthy/carb modified Room service appropriate? Yes with Assist; Fluid consistency: Thin; Fluid restriction: 2000 mL Fluid  Diet effective 1400                         Precautions / Restrictions Precautions Precautions: Fall Restrictions Weight Bearing Restrictions Per Provider Order: No    Has the patient had 2 or more falls or a fall with injury in the past year? No   Prior Activity Level Community (5-7x/wk): Pt. active in the community PTA   Prior Functional Level Self Care: Did the patient need help bathing, dressing, using the toilet or eating? Independent   Indoor Mobility: Did the patient need assistance with walking from room to room (with or without device)? Independent   Stairs: Did the patient need assistance with internal or external stairs (with or without device)? Independent   Functional Cognition: Did the patient need  help planning regular tasks such as shopping or remembering to take medications? Independent   Patient Information Are you of Hispanic, Latino/a,or Spanish origin?: A. No, not of Hispanic, Latino/a, or Spanish origin What is your race?: A. White Do you need or want an interpreter to communicate with a doctor or health care staff?: 0. No   Patient's Response To:  Health Literacy and Transportation Is the patient able to respond to health literacy and transportation needs?: Yes Health Literacy - How often do you need to have someone help you when you read instructions, pamphlets, or other written material from your doctor or pharmacy?: Never In the past 12 months, has lack of transportation kept you from medical appointments or from getting medications?: No In the past 12 months, has lack of transportation kept you from meetings, work, or from getting things needed for daily living?: No   Home Assistive Devices / Equipment Home Equipment: None   Prior Device Use: Indicate devices/aids used by the patient prior to current illness, exacerbation or injury? None of the above   Current Functional Level Cognition   Orientation Level: Oriented X4    Extremity Assessment (includes Sensation/Coordination)   Upper Extremity Assessment: Overall WFL for tasks assessed  Lower Extremity Assessment: Defer to PT evaluation RLE Deficits / Details: grossly 4/5 throughout RLE Sensation: decreased light touch (?peripheral neuropathy) LLE Deficits / Details: grossly 4-/5 with greater weakness at dorsiflexion due to pain 3+/5 LLE Sensation: decreased light touch (?peripheral neuropathy)     ADLs   Overall ADL's : Needs assistance/impaired Eating/Feeding: Independent Grooming: Contact guard assist, Standing Upper Body Bathing: Minimal assistance, Sitting Lower Body Bathing: Moderate assistance, Sit to/from stand Upper Body Dressing : Contact guard assist, Sitting Lower Body Dressing: Moderate  assistance, Sit to/from stand Toilet Transfer: Minimal assistance Functional mobility during ADLs: Minimal assistance, Rolling walker (2 wheels) General ADL Comments: Performs transitional movements with increased time to manage dizziness level     Mobility   Overal bed mobility: Needs Assistance Bed Mobility: Supine to Sit, Sit to Supine  Rolling: Modified independent (Device/Increase time) Supine to sit: Modified independent (Device/Increase time), HOB elevated Sit to supine: Modified independent (Device/Increase time), HOB elevated     Transfers   Overall transfer level: Needs assistance Equipment used: Rolling walker (2 wheels) Transfers: Sit to/from Stand Sit to Stand: Contact guard assist     Ambulation / Gait / Stairs / Wheelchair Mobility   Ambulation/Gait Ambulation/Gait assistance: Editor, commissioning (Feet): 15 Feet (2x15) Assistive device: 1 person hand held assist Gait Pattern/deviations: Step-through pattern, Decreased step length - right, Decreased step length - left, Decreased stride length, Shuffle, Staggering left, Drifts right/left, Trunk flexed Gait velocity: decr     Posture / Balance Dynamic Sitting Balance Sitting balance - Comments: increased use of BUE for support at EOB Balance Overall balance assessment: Needs assistance Sitting-balance support: Bilateral upper extremity supported, Feet supported Sitting balance-Leahy Scale: Fair Sitting balance - Comments: increased use of BUE for support at EOB Standing balance support: During functional activity, Single extremity supported, Bilateral upper extremity supported Standing balance-Leahy Scale: Poor Standing balance comment: reliant on support from therapist or external support     Special needs/care consideration Skin intact     Previous Home Environment (from acute therapy documentation) Living Arrangements: Alone  Lives With: Spouse Available Help at Discharge: Family Type of Home: House Home  Layout: One level Home Access: Stairs to enter Secretary/administrator of Steps: 2 Bathroom Shower/Tub: Health visitor: Administrator Accessibility: Yes Home Care Services: No Additional Comments: family (son) close by   Discharge Living Setting Plans for Discharge Living Setting: Patient's home Type of Home at Discharge: House Discharge Home Layout: One level Discharge Home Access: Stairs to enter Entrance Stairs-Rails: None Entrance Stairs-Number of Steps: 2 Discharge Bathroom Shower/Tub: Walk-in shower Discharge Bathroom Toilet: Standard Discharge Bathroom Accessibility: Yes How Accessible: Accessible via walker, Accessible via wheelchair Does the patient have any problems obtaining your medications?: No   Social/Family/Support Systems Patient Roles: Other (Comment) Contact Information: Pt. with Mod I goals   Goals Patient/Family Goal for Rehab: PT/OT/SLP min A Expected length of stay: 10-14 days Pt/Family Agrees to Admission and willing to participate: Yes Program Orientation Provided & Reviewed with Pt/Caregiver Including Roles  & Responsibilities: Yes   Decrease burden of Care through IP rehab admission: not anticipated   Possible need for SNF placement upon discharge: not anticipated   Patient Condition: I have reviewed medical records from [facility] , spoken with CM, and patient. I met with patient at the bedside for inpatient rehabilitation assessment.  Patient will benefit from ongoing PT and OT, can actively participate in 3 hours of therapy a day 5 days of the week, and can make measurable gains during the admission.  Patient will also benefit from the coordinated team approach during an Inpatient Acute Rehabilitation admission.  The patient will receive intensive therapy as well as Rehabilitation physician, nursing, social worker, and care management interventions.  Due to safety, skin/wound care, disease management, medication  administration, pain management, and patient education the patient requires 24 hour a day rehabilitation nursing.  The patient is currently Contact G-Min A with mobility and Supervision with basic ADLs.  Discharge setting and therapy post discharge at home with home health is anticipated.  Patient has agreed to participate in the Acute Inpatient Rehabilitation Program and will admit today.   Preadmission Screen Completed By:  Dorena Gander, 11/02/2023 2:07 PM ______________________________________________________________________   Discussed status with Dr. Dorn Gaskins on 11/04/23  at 1:43 PM  and received approval for admission today.   Admission Coordinator:  Dorena Gander, CCC-SLP, time 1:43 PM/Date 11/04/23     Assessment/Plan: Diagnosis: Cerebellar CVA Does the need for close, 24 hr/day Medical supervision in concert with the patient's rehab needs make it unreasonable for this patient to be served in a less intensive setting? Yes Co-Morbidities requiring supervision/potential complications: Vertigo, ataxia, acute on chronic heart failure, hypertension, arrhythmia status post ICD, OSA, generalized anxiety, type 2 diabetes, COPD, obesity Due to bladder management, bowel management, safety, skin/wound care, disease management, medication administration, pain management, and patient education, does the patient require 24 hr/day rehab nursing? Yes Does the patient require coordinated care of a physician, rehab nurse, PT, OT to address physical and functional deficits in the context of the above medical diagnosis(es)? Yes Addressing deficits in the following areas: balance, endurance, locomotion, strength, transferring, bowel/bladder control, bathing, dressing, feeding, grooming, and toileting Can the patient actively participate in an intensive therapy program of at least 3 hrs of therapy 5 days a week? Yes The potential for patient to make measurable gains while on inpatient rehab is good Anticipated  functional outcomes upon discharge from inpatient rehab: min assist PT, min assist OT Estimated rehab length of stay to reach the above functional goals is: 10-14 days Anticipated discharge destination: Home 10. Overall Rehab/Functional Prognosis: good     MD Signature:   Bea Lime, DO 11/04/2023

## 2023-11-04 NOTE — Progress Notes (Signed)
 PT Cancellation Note  Patient Details Name: [name]. MRN: [id number] DOB: [birth year]   Cancelled Treatment:    Reason Eval/Treat Not Completed: Patient declined, no reason specified (Pt declined stating that he was dizzy. Requested PT to come back later.)   Chaim Gatley 11/04/2023, 11:35 AM

## 2023-11-04 NOTE — Progress Notes (Signed)
 PROGRESS NOTE  [name]. QMV:[id number] DOB: 24-Jun-1956 DOA: 10/31/2023 PCP: Patient, No Pcp Per   LOS: 4 days   Brief narrative:  [name]. is a 68 y.o. male with history of systolic congestive heart failure with last known ejection fraction of 30 to 35%,  CVA, CAD s/p LCx stent, COPD, V. tach s/p AICD and chronic PVC who presented to hospital with multiple complaints including dizziness, lightheadedness ataxia shortness of breath chills vomiting and difficulty concentrating.  Of note patient was recently admitted between 3/25 until 3/28 for CHF exacerbation and was discharged with oxygen, furosemide  40 mg, spironolactone .  He complains of worsening shortness of breath, lightheadedness and nausea mostly with movement.  In the ED blood pressure was elevated and was tachypneic.  Troponins were normal.  Respiratory panel was unremarkable.  BNP elevated at 328.  CBC showed mild leukocytosis at 13.8.  EKG showed normal sinus rhythm.  Chest x-ray without any pulmonary edema.  CT head scan showed atrophy.  AICD interrogation was done without any events.  Patient was noted to be orthostatic in the ED.  Patient was transferred to [facility] given his AICD for further workup with dizziness and rule out stroke.        Assessment/Plan: Principal Problem:   Dizziness Active Problems:   Essential hypertension   History of CVA (cerebrovascular accident)   HFrEF (heart failure with reduced ejection fraction) (HCC)   History of CAD (coronary artery disease)   Non-insulin  dependent type 2 diabetes mellitus (HCC)   History of COPD   GAD (generalized anxiety disorder)  Dizziness and ataxia Improved at this time.  Stated that he did have some dizziness and vertigo today.  MRI brain showed infarct in the cerebellar vermis.  Neurology on board at this time and recommend aspirin  and Plavix .  2D echocardiogram with LV ejection fraction of 30 to 35% with regional wall motion abnormality and  grade 1 diastolic dysfunction.  PT OT evaluation has recommended CIR at this time.  On meclizine  as needed.  History of CVA Continue aspirin  and ezetimibe .  Continue Plavix .    HFrEF 30 to 35% Essential hypertension Has chronic dyspnea with elevated BNP.  Reported compliance with diuretics at home.  Chest x-ray without any vascular congestion.  Intolerance to Jardiance .  Continue Coreg  3.125 twice daily at home including torsemide  daily.   2D echocardiogram with LV ejection fraction of 30 to 35%.  Appears compensated.  History of CAD s/p LCx stent  Continue Coreg , aspirin , Plavix  and ezetimibe .     History of ventricular tachycardia status post ICD History of frequent PVC - Continue Coreg .  Amiodarone  has been discontinued by cardiology given patient has an ICD placed.  Check 2D echocardiogram with LV ejection fraction of 30 to 35% with grade 1 diastolic dysfunction.  Moderate hypokinesis of the left ventricular apical segment..    History of OSA Class I obesity.Body mass index is 33.91 kg/m.  Did not want CPAP in the past.  Would benefit from ongoing weight loss as outpatient.   History of generalized anxiety disorder Was prescribed Zoloft  but has not taken recently.   Non-insulin -dependent DM type II -History of intolerance to Jardiance  due to fatigue.  Latest hemoglobin A1c 6.4 diet managed. Continue heart healthy carb modified diet and check POC blood glucose with low sliding scale insulin .   History of COPD -Continue Dulera  twice daily and DuoNeb as needed.  DVT prophylaxis: enoxaparin  (LOVENOX ) injection 40 mg Start: 11/01/23 1845  SCDs Start: 10/31/23 0210 Place TED hose Start: 10/31/23 0210   Disposition: CIR as per PT evaluation.  Medically stable for disposition.  Status is: Inpatient  Remains inpatient appropriate because: Need for rehabilitation, status post stroke,     Code Status:     Code Status: Full Code  Family Communication: Not at  bedside  Consultants: Neurology  Procedures: None  Anti-infectives:  None  Anti-infectives (From admission, onward)    None       Subjective: Today, patient was seen and examined at bedside.  Patient stated that he did have some dizziness and vertigo today when standing up.  Denies any chest pain, shortness of breath, dyspnea or shortness of breath.  Objective: Vitals:   11/04/23 0742 11/04/23 0825  BP: 119/76   Pulse: 61   Resp: 20 20  Temp: 98.2 F (36.8 C)   SpO2: 93% 95%    Intake/Output Summary (Last 24 hours) at 11/04/2023 1020 Last data filed at 11/03/2023 2200 Gross per 24 hour  Intake 840 ml  Output --  Net 840 ml   Filed Weights   10/31/23 1100  Weight: 95.3 kg   Body mass index is 33.91 kg/m.   Physical Exam:  GENERAL: Patient is alert awake and oriented. Not in obvious distress.  Obese. HENT: No scleral pallor or icterus. Pupils equally reactive to light. Oral mucosa is moist NECK: is supple, no gross swelling noted. CHEST: Clear to auscultation. No crackles or wheezes.   CVS: S1 and S2 heard, no murmur. Regular rate and rhythm.  ABDOMEN: Soft, non-tender, bowel sounds are present. EXTREMITIES: No edema. CNS: Cranial nerves are intact.  Generalized weakness noted SKIN: warm and dry without rashes.  Data Review: I have personally reviewed the following laboratory data and studies,  CBC: Recent Labs  Lab 10/30/23 1759 10/31/23 0605 11/02/23 0544 11/03/23 0612  WBC 13.8* 13.3* 11.5* 13.9*  HGB 18.5* 16.5 16.8 17.0  HCT 52.2* 47.7 48.6 48.9  MCV 84.2 85.8 85.3 84.3  PLT 236 220 197 213   Basic Metabolic Panel: Recent Labs  Lab 10/30/23 1759 10/31/23 0605 11/02/23 0544 11/03/23 0612  NA 135 139 135 138  K 4.1 4.0 3.6 3.8  CL 103 106 102 103  CO2 18* 22 22 23   GLUCOSE 207* 131* 171* 141*  BUN 17 19 24* 20  CREATININE 1.02 1.17 1.27* 1.23  CALCIUM  9.5 9.2 8.7* 8.9  MG  --   --  2.0 2.0   Liver Function Tests: Recent Labs   Lab 10/31/23 0605  AST 16  ALT 11  ALKPHOS 61  BILITOT 1.4*  PROT 6.7  ALBUMIN 3.4*   No results for input(s): "LIPASE", "AMYLASE" in the last 168 hours. No results for input(s): "AMMONIA" in the last 168 hours. Cardiac Enzymes: No results for input(s): "CKTOTAL", "CKMB", "CKMBINDEX", "TROPONINI" in the last 168 hours. BNP (last 3 results) Recent Labs    07/13/23 1520 09/27/23 0956 10/30/23 1759  BNP 279.5* 603.3* 328.2*    ProBNP (last 3 results) No results for input(s): "PROBNP" in the last 8760 hours.  CBG: Recent Labs  Lab 11/03/23 1158 11/03/23 1602 11/03/23 2128 11/04/23 0604 11/04/23 0836  GLUCAP 215* 191* 293* 146* 167*   Recent Results (from the past 240 hours)  Resp panel by RT-PCR (RSV, Flu A&B, Covid) Anterior Nasal Swab     Status: None   Collection Time: 10/30/23  6:39 PM   Specimen: Anterior Nasal Swab  Result Value Ref Range Status  SARS Coronavirus 2 by RT PCR NEGATIVE NEGATIVE Final    Comment: (NOTE) SARS-CoV-2 target nucleic acids are NOT DETECTED.  The SARS-CoV-2 RNA is generally detectable in upper respiratory specimens during the acute phase of infection. The lowest concentration of SARS-CoV-2 viral copies this assay can detect is 138 copies/mL. A negative result does not preclude SARS-Cov-2 infection and should not be used as the sole basis for treatment or other patient management decisions. A negative result may occur with  improper specimen collection/handling, submission of specimen other than nasopharyngeal swab, presence of viral mutation(s) within the areas targeted by this assay, and inadequate number of viral copies(<138 copies/mL). A negative result must be combined with clinical observations, patient history, and epidemiological information. The expected result is Negative.  Fact Sheet for Patients:  BloggerCourse.com  Fact Sheet for Healthcare Providers:   SeriousBroker.it  This test is no t yet approved or cleared by the [place]  FDA and  has been authorized for detection and/or diagnosis of SARS-CoV-2 by FDA under an Emergency Use Authorization (EUA). This EUA will remain  in effect (meaning this test can be used) for the duration of the COVID-19 declaration under Section 564(b)(1) of the Act, 21 U.S.C.section 360bbb-3(b)(1), unless the authorization is terminated  or revoked sooner.       Influenza A by PCR NEGATIVE NEGATIVE Final   Influenza B by PCR NEGATIVE NEGATIVE Final    Comment: (NOTE) The Xpert Xpress SARS-CoV-2/FLU/RSV plus assay is intended as an aid in the diagnosis of influenza from Nasopharyngeal swab specimens and should not be used as a sole basis for treatment. Nasal washings and aspirates are unacceptable for Xpert Xpress SARS-CoV-2/FLU/RSV testing.  Fact Sheet for Patients: BloggerCourse.com  Fact Sheet for Healthcare Providers: SeriousBroker.it  This test is not yet approved or cleared by the [place]  FDA and has been authorized for detection and/or diagnosis of SARS-CoV-2 by FDA under an Emergency Use Authorization (EUA). This EUA will remain in effect (meaning this test can be used) for the duration of the COVID-19 declaration under Section 564(b)(1) of the Act, 21 U.S.C. section 360bbb-3(b)(1), unless the authorization is terminated or revoked.     Resp Syncytial Virus by PCR NEGATIVE NEGATIVE Final    Comment: (NOTE) Fact Sheet for Patients: BloggerCourse.com  Fact Sheet for Healthcare Providers: SeriousBroker.it  This test is not yet approved or cleared by the [place]  FDA and has been authorized for detection and/or diagnosis of SARS-CoV-2 by FDA under an Emergency Use Authorization (EUA). This EUA will remain in effect (meaning this test can be used) for  the duration of the COVID-19 declaration under Section 564(b)(1) of the Act, 21 U.S.C. section 360bbb-3(b)(1), unless the authorization is terminated or revoked.  Performed at [facility], [address], [place], [place] [address]      Studies: No results found.     Rosena Conradi, MD  [facility] 11/04/2023  If 7PM-7AM, please contact night-coverage

## 2023-11-04 NOTE — Plan of Care (Signed)

## 2023-11-04 NOTE — Progress Notes (Signed)
 Inpatient Rehab Admissions Coordinator:  Received insurance authorization. There is a bed available for pt today in CIR. Dr. Efrain Grant aware and in agreement. Pt, NSG and TOC made aware.    Artemus Larsen, MS, CCC-SLP Admissions Coordinator [phone]

## 2023-11-04 NOTE — Discharge Summary (Signed)
 Physician Discharge Summary  [name]. BJY:[id number] DOB: 05-09-1956 DOA: 10/31/2023  PCP: Patient, No Pcp Per  Admit date: 10/31/2023 Discharge date: 11/04/2023  Admitted From: Home  Discharge disposition: CIR   Recommendations for Outpatient Follow-Up:   Follow up with your primary care provider i after rehabilitation stay. Check CBC, BMP, magnesium  in the next visit Follow-up with [facility] neurology as outpatient.   Discharge Diagnosis:   Principal Problem:   Dizziness Active Problems:   Essential hypertension   History of CVA (cerebrovascular accident)   HFrEF (heart failure with reduced ejection fraction) (HCC)   History of CAD (coronary artery disease)   Non-insulin  dependent type 2 diabetes mellitus (HCC)   History of COPD   GAD (generalized anxiety disorder)   Discharge Condition: Improved.  Diet recommendation: Low sodium, heart healthy.  Carbohydrate-modified.    Wound care: None.  Code status: Full.   History of Present Illness:   [name]. is a 69 y.o. male with history of systolic congestive heart failure with last known ejection fraction of 30 to 35%, CVA, CAD s/p LCx stent, COPD, V. tach s/p AICD and chronic PVC who presented to hospital with multiple complaints including dizziness, lightheadedness ataxia shortness of breath chills vomiting and difficulty concentrating. Of note patient was recently admitted between 3/25 until 3/28 for CHF exacerbation and was discharged with oxygen, furosemide  40 mg, spironolactone . He complains of worsening shortness of breath, lightheadedness and nausea mostly with movement. In the ED blood pressure was elevated and was tachypneic. Troponins were normal. Respiratory panel was unremarkable. BNP elevated at 328. CBC showed mild leukocytosis at 13.8. EKG showed normal sinus rhythm. Chest x-ray without any pulmonary edema. CT head scan showed atrophy. AICD interrogation was done without any events. Patient was  noted to be orthostatic in the ED. Patient was transferred to [facility] given his AICD for further workup with dizziness and rule out stroke.    Hospital Course:   Following conditions were addressed during hospitalization as listed below,  Dizziness and ataxia Improved at this time.  Stated that he did have some dizziness and vertigo today.  MRI brain showed infarct in the cerebellar vermis.  Neurology on board at this time and recommend aspirin  and Plavix .  2D echocardiogram with LV ejection fraction of 30 to 35% with regional wall motion abnormality and grade 1 diastolic dysfunction.  PT OT evaluation has recommended CIR at this time.  On meclizine  as needed.   History of CVA Continue aspirin  and ezetimibe .  Continue Plavix .  Seen by neurology during hospitalization.  Recommend follow-up with [facility] neurology as outpatient.    HFrEF 30 to 35% Essential hypertension Has chronic dyspnea with elevated BNP.  Reported compliance with diuretics at home.  Chest x-ray without any vascular congestion.  Intolerance to Jardiance .  Continue Coreg  3.125 twice daily at home including torsemide  daily.   2D echocardiogram with LV ejection fraction of 30 to 35%.  Appears compensated.  Entresto  and Farxiga  on hold at this time and will need to be started appropriately.  History of CAD s/p LCx stent History of ventricular tachycardia status post ICD History of frequent PVC - Continue Coreg , Plavix , ezetimibe .  Amiodarone  has been discontinued by cardiology given patient has an ICD placed. 2D echocardiogram with LV ejection fraction of 30 to 35% with grade 1 diastolic dysfunction.  Moderate hypokinesis of the left ventricular apical segment..  Farxiga  and Entresto  currently on hold.    History of OSA Class  I obesity.Body mass index is 33.91 kg/m.  Did not want CPAP in the past.  Would benefit from ongoing weight loss as outpatient.   History of generalized anxiety disorder Was prescribed  Zoloft  but has not taken recently.   Non-insulin -dependent DM type II -History of intolerance to Jardiance  due to fatigue.  Latest hemoglobin A1c 6.4 diet managed. Continue heart healthy carb modified diet and check POC blood glucose with low sliding scale insulin .   History of COPD -Continue Dulera  twice daily and DuoNeb   Disposition.  At this time, patient is stable for disposition to CIR.  Medical Consultants:   Neurology Procedures:    None Subjective:   Today, patient was seen and examined at bedside.  Patient stated that he did have some dizziness and vertigo today when standing up.  Denies any chest pain, shortness of breath, dyspnea or shortness of breath.   Discharge Exam:   Vitals:   11/04/23 0825 11/04/23 1134  BP:  129/79  Pulse:  63  Resp: 20 18  Temp:    SpO2: 95% 91%   Vitals:   11/04/23 0442 11/04/23 0742 11/04/23 0825 11/04/23 1134  BP: 103/65 119/76  129/79  Pulse: 64 61  63  Resp: 18 20 20 18   Temp: 98 F (36.7 C) 98.2 F (36.8 C)    TempSrc: Oral Oral    SpO2: 97% 93% 95% 91%  Weight:      Height:       Body mass index is 33.91 kg/m.   General: Alert awake, not in obvious distress, obese. HENT: pupils equally reacting to light,  No scleral pallor or icterus noted. Oral mucosa is moist.  Chest:  Clear breath sounds.  No crackles or wheezes.  CVS: S1 &S2 heard. No murmur.  Regular rate and rhythm. Abdomen: Soft, nontender, nondistended.  Bowel sounds are heard.   Extremities: No cyanosis, clubbing or edema.  Peripheral pulses are palpable. Psych: Alert, awake and oriented, normal mood CNS:  No cranial nerve deficits.  Generalized weakness noted Skin: Warm and dry.  No rashes noted.  The results of significant diagnostics from this hospitalization (including imaging, microbiology, ancillary and laboratory) are listed below for reference.     Diagnostic Studies:   ECHOCARDIOGRAM COMPLETE Result Date: 11/01/2023    ECHOCARDIOGRAM REPORT    Patient Name:   [name]. Date of Exam: 11/01/2023 Medical Rec #:  [id number]         Height:       66.0 in Accession #:    [id number]        Weight:       210.1 lb Date of Birth:  02/14/1956          BSA:          2.042 m Patient Age:    33 years          BP:           125/87 mmHg Patient Gender: M                 HR:           66 bpm. Exam Location:  Inpatient Procedure: 2D Echo, Color Doppler, Cardiac Doppler and Intracardiac            Opacification Agent (Both Spectral and Color Flow Doppler were            utilized during procedure). Indications:    Stroke  History:  Patient has prior history of Echocardiogram examinations.                 Cardiomyopathy and CHF, CAD, Stroke, Arrythmias:Pacemaker; Risk                 Factors:Former Smoker, Sleep Apnea and Diabetes.  Sonographer:    Willey Harrier Referring Phys: [id number] JINDONG XU IMPRESSIONS  1. There is no left ventricular thrombus (Definity  contrast was used). Left ventricular ejection fraction, by estimation, is 30 to 35%. The left ventricle has moderately decreased function. The left ventricle demonstrates regional wall motion abnormalities (see scoring diagram/findings for description). The left ventricular internal cavity size was moderately dilated. Left ventricular diastolic parameters are consistent with Grade I diastolic dysfunction (impaired relaxation). There is akinesis of the left ventricular, basal-mid inferior wall and inferolateral wall. There is moderate hypokinesis of the left ventricular, entire apical segment.  2. Right ventricular systolic function is mildly reduced. The right ventricular size is normal.  3. Left atrial size was mildly dilated.  4. Right atrial size was mildly dilated.  5. The mitral valve is normal in structure. Mild to moderate mitral valve regurgitation. No evidence of mitral stenosis.  6. The aortic valve is tricuspid. There is mild calcification of the aortic valve. Aortic valve regurgitation is not  visualized. Aortic valve sclerosis/calcification is present, without any evidence of aortic stenosis. Comparison(s): Prior images reviewed side by side. The left ventricular function is unchanged. The left ventricular diastolic function has improved. The left ventricular wall motion abnormalities are unchanged. Left heart filling pressures are no longer elevated and mitral insufficiency appears less severe. FINDINGS  Left Ventricle: There is no left ventricular thrombus (Definity  contrast was used). Left ventricular ejection fraction, by estimation, is 30 to 35%. The left ventricle has moderately decreased function. The left ventricle demonstrates regional wall motion abnormalities. Moderate hypokinesis of the left ventricular, entire apical segment. The left ventricular internal cavity size was moderately dilated. There is no left ventricular hypertrophy. Left ventricular diastolic parameters are consistent with Grade I diastolic dysfunction (impaired relaxation). Normal left ventricular filling pressure.  LV Wall Scoring: The inferior wall, posterior wall, and basal anterolateral segment are akinetic. The apical septal segment, apical anterior segment, apical inferior segment, and apex are hypokinetic. The anterior wall, anterior septum, apical lateral segment, mid anterolateral segment, mid inferoseptal segment, and basal inferoseptal segment are normal. Right Ventricle: The right ventricular size is normal. No increase in right ventricular wall thickness. Right ventricular systolic function is mildly reduced. Left Atrium: Left atrial size was mildly dilated. Right Atrium: Right atrial size was mildly dilated. Pericardium: There is no evidence of pericardial effusion. Mitral Valve: The mitral valve is normal in structure. Mild to moderate mitral valve regurgitation, with centrally-directed jet. No evidence of mitral valve stenosis. MV peak gradient, 4.4 mmHg. The mean mitral valve gradient is 1.0 mmHg. Tricuspid  Valve: The tricuspid valve is normal in structure. Tricuspid valve regurgitation is not demonstrated. Aortic Valve: The aortic valve is tricuspid. There is mild calcification of the aortic valve. Aortic valve regurgitation is not visualized. Aortic valve sclerosis/calcification is present, without any evidence of aortic stenosis. Aortic valve peak gradient measures 4.6 mmHg. Pulmonic Valve: The pulmonic valve was grossly normal. Pulmonic valve regurgitation is not visualized. No evidence of pulmonic stenosis. Aorta: The aortic root and ascending aorta are structurally normal, with no evidence of dilitation. IAS/Shunts: No atrial level shunt detected by color flow Doppler. Additional Comments: A device lead is visualized in the  right ventricle and right atrium.  LEFT VENTRICLE PLAX 2D LVIDd:         6.50 cm   Diastology LVIDs:         6.00 cm   LV e' medial:    2.63 cm/s LV PW:         0.70 cm   LV E/e' medial:  13.3 LV IVS:        0.90 cm   LV e' lateral:   3.29 cm/s LVOT diam:     2.00 cm   LV E/e' lateral: 10.7 LV SV:         52 LV SV Index:   26 LVOT Area:     3.14 cm  RIGHT VENTRICLE             IVC RV Basal diam:  4.00 cm     IVC diam: 1.60 cm RV S prime:     10.50 cm/s TAPSE (M-mode): 1.7 cm LEFT ATRIUM             Index        RIGHT ATRIUM           Index LA Vol (A2C):   68.6 ml 33.59 ml/m  RA Area:     19.30 cm LA Vol (A4C):   48.1 ml 23.55 ml/m  RA Volume:   62.30 ml  30.51 ml/m LA Biplane Vol: 59.5 ml 29.14 ml/m  AORTIC VALVE AV Area (Vmax): 2.62 cm AV Vmax:        107.00 cm/s AV Peak Grad:   4.6 mmHg LVOT Vmax:      89.40 cm/s LVOT Vmean:     59.100 cm/s LVOT VTI:       0.166 m  AORTA Ao Root diam: 3.50 cm Ao Asc diam:  3.40 cm MITRAL VALVE MV Area (PHT): 2.29 cm    SHUNTS MV Area VTI:   2.05 cm    Systemic VTI:  0.17 m MV Peak grad:  4.4 mmHg    Systemic Diam: 2.00 cm MV Mean grad:  1.0 mmHg MV Vmax:       1.05 m/s MV Vmean:      53.3 cm/s MV Decel Time: 331 msec MR Peak grad: 101.6 mmHg MR Mean  grad: 63.0 mmHg MR Vmax:      504.00 cm/s MR Vmean:     373.0 cm/s MV E velocity: 35.10 cm/s MV A velocity: 99.00 cm/s MV E/A ratio:  0.35 Mihai Croitoru MD Electronically signed by Luana Rumple MD Signature Date/Time: 11/01/2023/11:38:30 AM    Final    MR BRAIN WO CONTRAST Result Date: 10/31/2023 CLINICAL DATA:  Stroke, follow up.  Dizziness. EXAM: MRI HEAD WITHOUT CONTRAST MRA HEAD WITHOUT CONTRAST MRA NECK WITHOUT AND WITH CONTRAST TECHNIQUE: Multiplanar, multi-echo pulse sequences of the brain and surrounding structures were acquired without intravenous contrast. Angiographic images of the Circle of Willis were acquired using MRA technique without intravenous contrast. Angiographic images of the neck were acquired using MRA technique without and with intravenous contrast. Carotid stenosis measurements (when applicable) are obtained utilizing NASCET criteria, using the distal internal carotid diameter as the denominator. CONTRAST:  9mL GADAVIST  GADOBUTROL  1 MMOL/ML IV SOLN COMPARISON:  CT head 10/30/2023. MRI head, MRA head, and MRA neck 12/26/2021. FINDINGS: MRI HEAD FINDINGS Brain: There is a punctate acute infarct in the cerebellar vermis (series 5, image 70). Scattered chronic cerebral and cerebellar microhemorrhages have mildly increased in number from the prior MRI and may reflect the sequelae of  chronic hypertension. Patchy to confluent T2 hyperintensities in the cerebral white matter bilaterally are similar to the prior MRI and are nonspecific but compatible with moderate to severe chronic small vessel ischemic disease. Chronic lacunar infarcts are again seen in the cerebral white matter bilaterally, right basal ganglia, left thalamus, and both cerebellar hemispheres. There is mild cerebral atrophy. No mass, midline shift, or extra-axial fluid collection is identified. Vascular: Major intracranial arterial flow voids are preserved. Skull and upper cervical spine: Unremarkable bone marrow signal.  Sinuses/Orbits: Right cataract extraction. 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 Clear mastoid air cells. Other: None. MRA HEAD FINDINGS Anterior circulation: The internal carotid arteries are widely patent from skull base to carotid termini. ACAs and MCAs are patent without evidence of a proximal branch occlusion or significant proximal stenosis. No aneurysm is identified. Posterior circulation: The included portions of the intracranial vertebral arteries are widely patent to the basilar with the left being slightly dominant. Patent left PICA, right AICA, and bilateral SCA origins are visualized. The basilar artery is patent with a mild stenosis in its midportion. There are left larger than right posterior communicating arteries. Both PCAs are patent without evidence of a high-grade proximal stenosis. No aneurysm is identified. Anatomic variants: None. MRA NECK FINDINGS Evaluation is limited by contrast timing and motion artifact. Aortic arch: Normal variant aortic arch branching pattern with the left vertebral artery arising directly from the arch. Right carotid system: Patent without evidence of a significant stenosis within study limitations. Left carotid system: Patent without evidence of a significant stenosis within study limitations. Vertebral arteries: Patent with antegrade flow bilaterally. Nondiagnostic assessment for stenosis in the V1 segments due to study limitations. No evidence of a high-grade stenosis in the V2 or V3 segments allowing for intermittent motion artifact. IMPRESSION: 1. Punctate acute infarct in the cerebellar vermis. 2. Moderate to severe chronic small vessel ischemic disease. 3. Mild basilar artery stenosis. Otherwise unremarkable head MRA. 4. Limited MRA of the neck due to contrast timing and motion. No evidence of a high-grade stenosis of the common carotid arteries, cervical internal carotid arteries, or V2 or V3 segments of the vertebral arteries. Electronically  Signed   By: Aundra Lee [name]   On: 10/31/2023 13:27   MR ANGIO HEAD WO CONTRAST Result Date: 10/31/2023 CLINICAL DATA:  Stroke, follow up.  Dizziness. EXAM: MRI HEAD WITHOUT CONTRAST MRA HEAD WITHOUT CONTRAST MRA NECK WITHOUT AND WITH CONTRAST TECHNIQUE: Multiplanar, multi-echo pulse sequences of the brain and surrounding structures were acquired without intravenous contrast. Angiographic images of the Circle of Willis were acquired using MRA technique without intravenous contrast. Angiographic images of the neck were acquired using MRA technique without and with intravenous contrast. Carotid stenosis measurements (when applicable) are obtained utilizing NASCET criteria, using the distal internal carotid diameter as the denominator. CONTRAST:  9mL GADAVIST  GADOBUTROL  1 MMOL/ML IV SOLN COMPARISON:  CT head 10/30/2023. MRI head, MRA head, and MRA neck 12/26/2021. FINDINGS: MRI HEAD FINDINGS Brain: There is a punctate acute infarct in the cerebellar vermis (series 5, image 70). Scattered chronic cerebral and cerebellar microhemorrhages have mildly increased in number from the prior MRI and may reflect the sequelae of chronic hypertension. Patchy to confluent T2 hyperintensities in the cerebral white matter bilaterally are similar to the prior MRI and are nonspecific but compatible with moderate to severe chronic small vessel ischemic disease. Chronic lacunar infarcts are again seen in the cerebral white matter bilaterally, right basal ganglia, left thalamus, and both cerebellar hemispheres. There is  mild cerebral atrophy. No mass, midline shift, or extra-axial fluid collection is identified. Vascular: Major intracranial arterial flow voids are preserved. Skull and upper cervical spine: Unremarkable bone marrow signal. Sinuses/Orbits: Right cataract extraction. 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 Clear mastoid air cells. Other: None. MRA HEAD FINDINGS Anterior circulation: The internal carotid  arteries are widely patent from skull base to carotid termini. ACAs and MCAs are patent without evidence of a proximal branch occlusion or significant proximal stenosis. No aneurysm is identified. Posterior circulation: The included portions of the intracranial vertebral arteries are widely patent to the basilar with the left being slightly dominant. Patent left PICA, right AICA, and bilateral SCA origins are visualized. The basilar artery is patent with a mild stenosis in its midportion. There are left larger than right posterior communicating arteries. Both PCAs are patent without evidence of a high-grade proximal stenosis. No aneurysm is identified. Anatomic variants: None. MRA NECK FINDINGS Evaluation is limited by contrast timing and motion artifact. Aortic arch: Normal variant aortic arch branching pattern with the left vertebral artery arising directly from the arch. Right carotid system: Patent without evidence of a significant stenosis within study limitations. Left carotid system: Patent without evidence of a significant stenosis within study limitations. Vertebral arteries: Patent with antegrade flow bilaterally. Nondiagnostic assessment for stenosis in the V1 segments due to study limitations. No evidence of a high-grade stenosis in the V2 or V3 segments allowing for intermittent motion artifact. IMPRESSION: 1. Punctate acute infarct in the cerebellar vermis. 2. Moderate to severe chronic small vessel ischemic disease. 3. Mild basilar artery stenosis. Otherwise unremarkable head MRA. 4. Limited MRA of the neck due to contrast timing and motion. No evidence of a high-grade stenosis of the common carotid arteries, cervical internal carotid arteries, or V2 or V3 segments of the vertebral arteries. Electronically Signed   By: Aundra Lee [name]   On: 10/31/2023 13:27   MR ANGIO NECK W WO CONTRAST Result Date: 10/31/2023 CLINICAL DATA:  Stroke, follow up.  Dizziness. EXAM: MRI HEAD WITHOUT CONTRAST MRA HEAD  WITHOUT CONTRAST MRA NECK WITHOUT AND WITH CONTRAST TECHNIQUE: Multiplanar, multi-echo pulse sequences of the brain and surrounding structures were acquired without intravenous contrast. Angiographic images of the Circle of Willis were acquired using MRA technique without intravenous contrast. Angiographic images of the neck were acquired using MRA technique without and with intravenous contrast. Carotid stenosis measurements (when applicable) are obtained utilizing NASCET criteria, using the distal internal carotid diameter as the denominator. CONTRAST:  9mL GADAVIST  GADOBUTROL  1 MMOL/ML IV SOLN COMPARISON:  CT head 10/30/2023. MRI head, MRA head, and MRA neck 12/26/2021. FINDINGS: MRI HEAD FINDINGS Brain: There is a punctate acute infarct in the cerebellar vermis (series 5, image 70). Scattered chronic cerebral and cerebellar microhemorrhages have mildly increased in number from the prior MRI and may reflect the sequelae of chronic hypertension. Patchy to confluent T2 hyperintensities in the cerebral white matter bilaterally are similar to the prior MRI and are nonspecific but compatible with moderate to severe chronic small vessel ischemic disease. Chronic lacunar infarcts are again seen in the cerebral white matter bilaterally, right basal ganglia, left thalamus, and both cerebellar hemispheres. There is mild cerebral atrophy. No mass, midline shift, or extra-axial fluid collection is identified. Vascular: Major intracranial arterial flow voids are preserved. Skull and upper cervical spine: Unremarkable bone marrow signal. Sinuses/Orbits: Right cataract extraction. 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 Clear mastoid air cells. Other: None. MRA HEAD FINDINGS Anterior circulation: The internal carotid  arteries are widely patent from skull base to carotid termini. ACAs and MCAs are patent without evidence of a proximal branch occlusion or significant proximal stenosis. No aneurysm is identified.  Posterior circulation: The included portions of the intracranial vertebral arteries are widely patent to the basilar with the left being slightly dominant. Patent left PICA, right AICA, and bilateral SCA origins are visualized. The basilar artery is patent with a mild stenosis in its midportion. There are left larger than right posterior communicating arteries. Both PCAs are patent without evidence of a high-grade proximal stenosis. No aneurysm is identified. Anatomic variants: None. MRA NECK FINDINGS Evaluation is limited by contrast timing and motion artifact. Aortic arch: Normal variant aortic arch branching pattern with the left vertebral artery arising directly from the arch. Right carotid system: Patent without evidence of a significant stenosis within study limitations. Left carotid system: Patent without evidence of a significant stenosis within study limitations. Vertebral arteries: Patent with antegrade flow bilaterally. Nondiagnostic assessment for stenosis in the V1 segments due to study limitations. No evidence of a high-grade stenosis in the V2 or V3 segments allowing for intermittent motion artifact. IMPRESSION: 1. Punctate acute infarct in the cerebellar vermis. 2. Moderate to severe chronic small vessel ischemic disease. 3. Mild basilar artery stenosis. Otherwise unremarkable head MRA. 4. Limited MRA of the neck due to contrast timing and motion. No evidence of a high-grade stenosis of the common carotid arteries, cervical internal carotid arteries, or V2 or V3 segments of the vertebral arteries. Electronically Signed   By: Aundra Lee [name]   On: 10/31/2023 13:27     Labs:   Basic Metabolic Panel: Recent Labs  Lab 10/30/23 1759 10/31/23 0605 11/02/23 0544 11/03/23 0612  NA 135 139 135 138  K 4.1 4.0 3.6 3.8  CL 103 106 102 103  CO2 18* 22 22 23   GLUCOSE 207* 131* 171* 141*  BUN 17 19 24* 20  CREATININE 1.02 1.17 1.27* 1.23  CALCIUM  9.5 9.2 8.7* 8.9  MG  --   --  2.0 2.0    GFR Estimated Creatinine Clearance: 63 mL/min (by C-G formula based on SCr of 1.23 mg/dL). Liver Function Tests: Recent Labs  Lab 10/31/23 0605  AST 16  ALT 11  ALKPHOS 61  BILITOT 1.4*  PROT 6.7  ALBUMIN 3.4*   No results for input(s): "LIPASE", "AMYLASE" in the last 168 hours. No results for input(s): "AMMONIA" in the last 168 hours. Coagulation profile No results for input(s): "INR", "PROTIME" in the last 168 hours.  CBC: Recent Labs  Lab 10/30/23 1759 10/31/23 0605 11/02/23 0544 11/03/23 0612  WBC 13.8* 13.3* 11.5* 13.9*  HGB 18.5* 16.5 16.8 17.0  HCT 52.2* 47.7 48.6 48.9  MCV 84.2 85.8 85.3 84.3  PLT 236 220 197 213   Cardiac Enzymes: No results for input(s): "CKTOTAL", "CKMB", "CKMBINDEX", "TROPONINI" in the last 168 hours. BNP: Invalid input(s): "POCBNP" CBG: Recent Labs  Lab 11/03/23 1602 11/03/23 2128 11/04/23 0604 11/04/23 0836 11/04/23 1134  GLUCAP 191* 293* 146* 167* 247*   D-Dimer No results for input(s): "DDIMER" in the last 72 hours. Hgb A1c No results for input(s): "HGBA1C" in the last 72 hours. Lipid Profile No results for input(s): "CHOL", "HDL", "LDLCALC", "TRIG", "CHOLHDL", "LDLDIRECT" in the last 72 hours. Thyroid  function studies No results for input(s): "TSH", "T4TOTAL", "T3FREE", "THYROIDAB" in the last 72 hours.  Invalid input(s): "FREET3" Anemia work up No results for input(s): "VITAMINB12", "FOLATE", "FERRITIN", "TIBC", "IRON", "RETICCTPCT" in the last 72 hours.  Microbiology Recent Results (from the past 240 hours)  Resp panel by RT-PCR (RSV, Flu A&B, Covid) Anterior Nasal Swab     Status: None   Collection Time: 10/30/23  6:39 PM   Specimen: Anterior Nasal Swab  Result Value Ref Range Status   SARS Coronavirus 2 by RT PCR NEGATIVE NEGATIVE Final    Comment: (NOTE) SARS-CoV-2 target nucleic acids are NOT DETECTED.  The SARS-CoV-2 RNA is generally detectable in upper respiratory specimens during the acute phase of  infection. The lowest concentration of SARS-CoV-2 viral copies this assay can detect is 138 copies/mL. A negative result does not preclude SARS-Cov-2 infection and should not be used as the sole basis for treatment or other patient management decisions. A negative result may occur with  improper specimen collection/handling, submission of specimen other than nasopharyngeal swab, presence of viral mutation(s) within the areas targeted by this assay, and inadequate number of viral copies(<138 copies/mL). A negative result must be combined with clinical observations, patient history, and epidemiological information. The expected result is Negative.  Fact Sheet for Patients:  BloggerCourse.com  Fact Sheet for Healthcare Providers:  SeriousBroker.it  This test is no t yet approved or cleared by the [place]  FDA and  has been authorized for detection and/or diagnosis of SARS-CoV-2 by FDA under an Emergency Use Authorization (EUA). This EUA will remain  in effect (meaning this test can be used) for the duration of the COVID-19 declaration under Section 564(b)(1) of the Act, 21 U.S.C.section 360bbb-3(b)(1), unless the authorization is terminated  or revoked sooner.       Influenza A by PCR NEGATIVE NEGATIVE Final   Influenza B by PCR NEGATIVE NEGATIVE Final    Comment: (NOTE) The Xpert Xpress SARS-CoV-2/FLU/RSV plus assay is intended as an aid in the diagnosis of influenza from Nasopharyngeal swab specimens and should not be used as a sole basis for treatment. Nasal washings and aspirates are unacceptable for Xpert Xpress SARS-CoV-2/FLU/RSV testing.  Fact Sheet for Patients: BloggerCourse.com  Fact Sheet for Healthcare Providers: SeriousBroker.it  This test is not yet approved or cleared by the [place]  FDA and has been authorized for detection and/or diagnosis of SARS-CoV-2  by FDA under an Emergency Use Authorization (EUA). This EUA will remain in effect (meaning this test can be used) for the duration of the COVID-19 declaration under Section 564(b)(1) of the Act, 21 U.S.C. section 360bbb-3(b)(1), unless the authorization is terminated or revoked.     Resp Syncytial Virus by PCR NEGATIVE NEGATIVE Final    Comment: (NOTE) Fact Sheet for Patients: BloggerCourse.com  Fact Sheet for Healthcare Providers: SeriousBroker.it  This test is not yet approved or cleared by the [place]  FDA and has been authorized for detection and/or diagnosis of SARS-CoV-2 by FDA under an Emergency Use Authorization (EUA). This EUA will remain in effect (meaning this test can be used) for the duration of the COVID-19 declaration under Section 564(b)(1) of the Act, 21 U.S.C. section 360bbb-3(b)(1), unless the authorization is terminated or revoked.  Performed at [facility], [address], [place], [place] [address]      Discharge Instructions:   Discharge Instructions     Ambulatory referral to Neurology   Complete by: As directed    Follow up with stroke clinic NP at [facility] in about 4-6 weeks. Thanks.   Diet Carb Modified   Complete by: As directed    Discharge instructions   Complete by: As directed    Follow-up with your primary care provider and  [facility] as outpatient.   Increase activity slowly   Complete by: As directed       Allergies as of 11/04/2023       Reactions   Contrast Media [iodinated Contrast Media] Shortness Of Breath   Iohexol  Shortness Of Breath    Onset Date: [id number]   Jardiance  [empagliflozin ] Other (See Comments)   Fatigue/weakness   Atorvastatin  Other (See Comments)   Myalgias   Hydrocodone  Itching   Morphine And Codeine Other (See Comments)   Lost control    Penicillins Other (See Comments)   Unknown reaction   Rosuvastatin  Other (See Comments)    Myalgias        Medication List     STOP taking these medications    Entresto  24-26 MG Generic drug: sacubitril -valsartan    Farxiga  10 MG Tabs tablet Generic drug: dapagliflozin  propanediol       TAKE these medications    acetaminophen  325 MG tablet Commonly known as: TYLENOL  Take 2 tablets (650 mg total) by mouth every 6 (six) hours as needed for mild pain (pain score 1-3) (or Fever >/= 101).   aspirin  EC 81 MG tablet Take 1 tablet (81 mg total) by mouth daily. Swallow whole.   carvedilol  3.125 MG tablet Commonly known as: COREG  Take 1 tablet (3.125 mg total) by mouth 2 (two) times daily with a meal.   clopidogrel  75 MG tablet Commonly known as: PLAVIX  Take 1 tablet (75 mg total) by mouth daily.   docusate sodium  100 MG capsule Commonly known as: COLACE Take 1 capsule (100 mg total) by mouth 2 (two) times daily.   ezetimibe  10 MG tablet Commonly known as: ZETIA  Take 1 tablet (10 mg total) by mouth daily. Start taking on: Nov 05, 2023   insulin  aspart 100 UNIT/ML injection Commonly known as: novoLOG  Inject 0-6 Units into the skin 3 (three) times daily with meals.   insulin  aspart 100 UNIT/ML injection Commonly known as: novoLOG  Inject 0-5 Units into the skin at bedtime.   meclizine  12.5 MG tablet Commonly known as: ANTIVERT  Take 1 tablet (12.5 mg total) by mouth 3 (three) times daily as needed for dizziness.   nitroGLYCERIN  0.4 MG SL tablet Commonly known as: NITROSTAT  Place 1 tablet (0.4 mg total) under the tongue every 5 (five) minutes as needed for chest pain.   polyethylene glycol 17 g packet Commonly known as: MIRALAX  / GLYCOLAX  Take 17 g by mouth daily. Start taking on: Nov 05, 2023   Stiolto Respimat  2.5-2.5 MCG/ACT Aers Generic drug: Tiotropium Bromide-Olodaterol Inhale 2 puffs into the lungs daily.   torsemide  20 MG tablet Commonly known as: DEMADEX  Take 20 mg by mouth daily.   Ventolin  HFA 108 (90 Base) MCG/ACT inhaler Generic  drug: albuterol  Inhale 2 puffs into the lungs every 6 (six) hours as needed for wheezing or shortness of breath.        Follow-up Information     [facility] Follow up.   Why: The office will contact you for an appointment Contact information: [address], [place], [place] [address]  Phone: [phone]        [facility]. Schedule an appointment as soon as possible for a visit in 1 month(s).   Specialty: Neurology Why: stroke clinic Contact information: [address] [place]  [address] [phone]                 Time coordinating discharge: 39 minutes  Signed:  Sylvain Hasten  [facility] 11/04/2023, 1:52 PM

## 2023-11-04 NOTE — TOC Transition Note (Signed)
 Transition of Care [facility]) - Discharge Note   Patient Details  Name: [name]. MRN: [id number] Date of Birth: June 15, 1956  [facility] CM/SW Contact:  Jonathan Neighbor, RN Phone Number: 11/04/2023, 1:55 PM   Clinical Narrative:     Pt is discharging to CIR today. CM signing off.  Final next level of care: IP Rehab Facility Barriers to Discharge: No Barriers Identified   Patient Goals and CMS Choice   CMS Medicare.gov Compare Post Acute Care list provided to:: Patient Choice offered to / list presented to : Patient      Discharge Placement                       Discharge Plan and Services Additional resources added to the After Visit Summary for     Discharge Planning Services: CM Consult                                 Social Drivers of Health (SDOH) Interventions SDOH Screenings   Food Insecurity: No Food Insecurity (10/31/2023)  Housing: Low Risk  (10/31/2023)  Transportation Needs: No Transportation Needs (10/31/2023)  Utilities: At Risk (10/31/2023)  Depression (PHQ2-9): Low Risk  (03/09/2022)  Financial Resource Strain: Medium Risk (09/29/2023)  Physical Activity: Inactive (04/30/2019)  Social Connections: Moderately Isolated (10/31/2023)  Stress: No Stress Concern Present (04/30/2019)  Tobacco Use: Medium Risk (10/31/2023)     Readmission Risk Interventions     No data to display

## 2023-11-04 NOTE — H&P (Incomplete)
 Physical Medicine and Rehabilitation Admission H&P     HPI: [name], Kubesh. is a 68 year old right-handed male with history significant for diabetes mellitus, diastolic congestive heart failure, ischemic cardiomyopathy with ejection fraction of 30 to 35%, V. tach status post AICD, CAD status post stenting, chronic PVC, COPD quit smoking 12 years ago.   Per chart review patient lives alone.  1 level home 2 steps to enter.  Reportedly independent prior to previous admission.  Presented 10/31/2023 with unsteady gait lightheadedness and orthostasis as well as shortness of breath and bouts of vomiting.  CT/MRI showed punctate acute infarct in the cerebellar vermis with moderate to severe chronic small vessel ischemic disease mild basilar artery stenosis.  MRA otherwise was unremarkable.  Patient did not receive TNK.  Admission chemistries unremarkable except glucose 207, WBC 13,800, BNP 328.  Echocardiogram ejection fraction of 30 to 35% left ventricle demonstrating regional wall motion abnormality.  Grade 1 diastolic dysfunction.  She continued on aspirin  and Plavix  for CVA prophylaxis x 3 weeks then Plavix  alone.  Lovenox  for DVT prophylaxis.  Therapy evaluations completed due to patient's decreased functional mobility was admitted for a comprehensive rehab program.  Review of Systems  Constitutional:  Negative for chills and fever.  HENT:  Negative for hearing loss.   Eyes:  Negative for blurred vision and double vision.  Respiratory:  Positive for shortness of breath. Negative for cough and wheezing.   Cardiovascular:  Negative for chest pain, palpitations and leg swelling.  Gastrointestinal:  Positive for constipation, nausea and vomiting. Negative for heartburn.  Genitourinary:  Negative for dysuria, flank pain and hematuria.  Musculoskeletal:  Positive for joint pain and myalgias.  Skin:  Negative for rash.  Neurological:  Positive for dizziness, weakness and headaches.  All other systems  reviewed and are negative.  Past Medical History:  Diagnosis Date   Anginal pain (HCC)    Arthritis    CAD (coronary artery disease)    a. s/p MI with LAD and Diag stenting @ [name];  b. 06/2015 Cath: LAD 36m/d ISR, 100 RCA (ISR) w/ L->R collats, otw mod nonobs dzs-->Med Rx; c. 08/2019 Cath: LM nl, LAD 10p/m ISR, D1 20, D2 100, RI min irregs, LCX 62m/d, OM1 100, OM2 50, RCA 100p, 70d. RPDA fills via collats from LAD. EF 25-35%-->Med Rx; d. 10/2019 NSTEMI/Cath: LCX now 100 (2.75x15 Resolute Onyx DES), otw stable compared to 08/2019.   Chronic combined systolic and diastolic CHF (congestive heart failure) (HCC)    a. 06/2015 Echo: EF 20-25%, Gr3 DD; b. 10/2019 Echo: EF 25-30%, glob HK, sev inf/infapical HK. Mod dil LA.   Dyspnea    Essential hypertension    Headache 12/23/2021   Hyperlipidemia    Hypokalemia    a. 06/2015 in setting of diuresis.   Ischemic cardiomyopathy    a. 2011 EF 45% ([name]);  b. 06/2015 Echo: EF 20-25%; c. 04/2019 Echo: EF 40-45%; d. 08/2019 LV gram: EF 25-35%; e. 10/2019 Echo: EF 25-30%.   PVC's (premature ventricular contractions)    a. 09/2019 Zio (3 days): Avg hr 70, 4 runs NSVT, 5 runs SVT, rare PACs, frequent PVCs w/ 11.8% burden.   Sleep apnea    Stroke [facility])    Stroke/Right temporal lobe infarction [facility])    a. 10/2019 MRI brain: 1cm acute ischemic nonhemorrhagic R temporal lobe infarct. Age-related cerebral atrophy w/ moderate chronic small vessel ischemic dzs.   Type 2 diabetes mellitus with hyperglycemia (HCC) 10/08/2019   Past Surgical History:  Procedure Laterality Date   BACK SURGERY  04/2019   CARDIAC CATHETERIZATION N/A 06/10/2015   Procedure: Left Heart Cath;  Surgeon: Wenona Hamilton, MD;  Location: [facility] INVASIVE CV LAB;  Service: Cardiovascular;  Laterality: N/A;   CORONARY STENT INTERVENTION N/A 10/08/2019   Procedure: CORONARY STENT INTERVENTION;  Surgeon: Wenona Hamilton, MD;  Location: [facility] INVASIVE CV LAB;  Service: Cardiovascular;  Laterality: N/A;    CORONARY STENT PLACEMENT     ICD IMPLANT N/A 04/07/2022   Procedure: ICD IMPLANT;  Surgeon: Boyce Byes, MD;  Location: [facility] INVASIVE CV LAB;  Service: Cardiovascular;  Laterality: N/A;   LEFT HEART CATH AND CORONARY ANGIOGRAPHY N/A 10/08/2019   Procedure: LEFT HEART CATH AND CORONARY ANGIOGRAPHY poss pci;  Surgeon: Wenona Hamilton, MD;  Location: [facility] INVASIVE CV LAB;  Service: Cardiovascular;  Laterality: N/A;   RIGHT HEART CATH AND CORONARY ANGIOGRAPHY N/A 12/30/2021   Procedure: RIGHT HEART CATH AND CORONARY ANGIOGRAPHY;  Surgeon: Sammy Crisp, MD;  Location: [facility] INVASIVE CV LAB;  Service: Cardiovascular;  Laterality: N/A;   RIGHT/LEFT HEART CATH AND CORONARY ANGIOGRAPHY N/A 08/20/2019   Procedure: RIGHT/LEFT HEART CATH AND CORONARY ANGIOGRAPHY;  Surgeon: Wenona Hamilton, MD;  Location: [facility] INVASIVE CV LAB;  Service: Cardiovascular;  Laterality: N/A;   Family History  Problem Relation Age of Onset   Coronary artery disease Mother    Diabetes Mother    Coronary artery disease Father    Social History:  reports that he quit smoking about 12 years ago. His smoking use included cigarettes. He has never used smokeless tobacco. He reports that he does not drink alcohol and does not use drugs. Allergies:  Allergies  Allergen Reactions   Contrast Media [Iodinated Contrast Media] Shortness Of Breath   Iohexol  Shortness Of Breath     Onset Date: [id number]    Jardiance  [Empagliflozin ] Other (See Comments)    Fatigue/weakness   Atorvastatin  Other (See Comments)    Myalgias    Hydrocodone  Itching   Morphine And Codeine Other (See Comments)    Lost control    Penicillins Other (See Comments)    Unknown reaction   Rosuvastatin  Other (See Comments)    Myalgias    Medications Prior to Admission  Medication Sig Dispense Refill   albuterol  (VENTOLIN  HFA) 108 (90 Base) MCG/ACT inhaler Inhale 2 puffs into the lungs every 6 (six) hours as needed for wheezing or shortness of breath.  54 g 2   aspirin  EC 81 MG tablet Take 1 tablet (81 mg total) by mouth daily. Swallow whole. 90 tablet 3   carvedilol  (COREG ) 3.125 MG tablet Take 1 tablet (3.125 mg total) by mouth 2 (two) times daily with a meal. 120 tablet 0   dapagliflozin  propanediol (FARXIGA ) 10 MG TABS tablet Take 1 tablet (10 mg total) by mouth daily before breakfast. 30 tablet 6   nitroGLYCERIN  (NITROSTAT ) 0.4 MG SL tablet Place 1 tablet (0.4 mg total) under the tongue every 5 (five) minutes as needed for chest pain. 90 tablet 3   sacubitril -valsartan  (ENTRESTO ) 24-26 MG Take 1 tablet by mouth 2 (two) times daily. (Patient taking differently: Take 1 tablet by mouth daily.) 120 tablet 0   Tiotropium Bromide-Olodaterol (STIOLTO RESPIMAT ) 2.5-2.5 MCG/ACT AERS Inhale 2 puffs into the lungs daily.     torsemide  (DEMADEX ) 20 MG tablet Take 20 mg by mouth daily.        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 ADLs Comments: independent  Functional Status:  Mobility: Bed Mobility Overal bed mobility: Modified Independent Bed Mobility: Supine to Sit Rolling: Supervision Supine to sit: Modified independent (Device/Increase time) Sit to supine: Supervision Transfers Overall transfer level: Needs assistance Equipment used: Rolling walker (2 wheels) Transfers: Sit to/from Stand Sit to Stand: Contact guard  assist Ambulation/Gait Ambulation/Gait assistance: Min assist Gait Distance (Feet): 200 Feet Assistive device: 1 person hand held assist (Refused RW) Gait Pattern/deviations: Step-through pattern, Decreased step length - right, Decreased step length - left, Decreased stride length, Shuffle, Staggering left, Drifts right/left, Trunk flexed, Staggering right General Gait Details: Pt was a little unsteady reaching for external support. Pt reporting residual dizziness from OT session as well as nausea. Pt has 1 LOB when reaching for door requiring Min A to correct. Gait velocity: Quick movements, pt has to be reminded to slow down. Gait velocity interpretation: <1.8 ft/sec, indicate of risk for recurrent falls Stairs: Yes Stairs assistance: Contact guard assist Stair Management: Two rails, Forwards, Alternating pattern Number of Stairs: 2 General stair comments: Cues to use rails and slow down.    ADL: ADL Overall ADL's : Needs assistance/impaired Eating/Feeding: Independent Grooming: Wash/dry hands, Wash/dry face, Oral care, Supervision/safety Upper Body Bathing: Supervision/ safety, Standing Lower Body Bathing: Sit to/from stand, Supervison/ safety Upper Body Dressing : Supervision/safety, Sitting Lower Body Dressing: Supervision/safety, Sit to/from stand Toilet Transfer: Supervision/safety Functional mobility during ADLs: Supervision/safety General ADL Comments: Patient is performing adls at a supervision level with a wide BOS.  His balance deteriorates as the activity goes on  Cognition: Cognition Orientation Level: Oriented to person, Oriented to place, Oriented to situation, Disoriented to time Cognition Arousal: Alert Behavior During Therapy: [facility] for tasks assessed/performed  Physical Exam: Blood pressure 129/79, pulse 63, temperature 98.2 F (36.8 C), temperature source Oral, resp. rate 18, height 5\' 6"  (1.676 m), weight 95.3 kg, SpO2 91%. Physical Exam Neurological:      Comments: Patient is alert.  Makes eye contact with examiner.  Follows simple commands.  Provides name and age.     Results for orders placed or performed during the hospital encounter of 10/31/23 (from the past 48 hours)  Glucose, capillary     Status: Abnormal   Collection Time: 11/02/23  4:29 PM  Result Value Ref Range   Glucose-Capillary 195 (H) 70 - 99 mg/dL    Comment: Glucose reference range applies only to samples taken after fasting for at least 8 hours.   Comment 1 Notify RN   Glucose, capillary     Status: Abnormal   Collection Time: 11/02/23  9:22 PM  Result Value Ref Range   Glucose-Capillary 274 (H) 70 - 99 mg/dL    Comment: Glucose reference range applies only to samples taken after fasting for at least 8 hours.   Comment 1 Notify RN   CBC     Status: Abnormal   Collection Time: 11/03/23  6:12 AM  Result Value Ref Range   WBC 13.9 (H) 4.0 - 10.5 K/uL   RBC 5.80 4.22 - 5.81 MIL/uL   Hemoglobin 17.0  13.0 - 17.0 g/dL   HCT 16.1 09.6 - 04.5 %   MCV 84.3 80.0 - 100.0 fL   MCH 29.3 26.0 - 34.0 pg   MCHC 34.8 30.0 - 36.0 g/dL   RDW 40.9 81.1 - 91.4 %   Platelets 213 150 - 400 K/uL   nRBC 0.0 0.0 - 0.2 %    Comment: Performed at [facility], [address], [place], [place] [address]  Basic metabolic panel with GFR     Status: Abnormal   Collection Time: 11/03/23  6:12 AM  Result Value Ref Range   Sodium 138 135 - 145 mmol/L   Potassium 3.8 3.5 - 5.1 mmol/L   Chloride 103 98 - 111 mmol/L   CO2 23 22 - 32 mmol/L   Glucose, Bld 141 (H) 70 - 99 mg/dL    Comment: Glucose reference range applies only to samples taken after fasting for at least 8 hours.   BUN 20 8 - 23 mg/dL   Creatinine, Ser 6.21 0.61 - 1.24 mg/dL   Calcium  8.9 8.9 - 10.3 mg/dL   GFR, Estimated >30 >86 mL/min    Comment: (NOTE) Calculated using the CKD-EPI Creatinine Equation (2021)    Anion gap 12 5 - 15    Comment: Performed at [facility], [address], [place], [place] [address]   Magnesium      Status: None   Collection Time: 11/03/23  6:12 AM  Result Value Ref Range   Magnesium  2.0 1.7 - 2.4 mg/dL    Comment: Performed at [facility], [address] [place], [place] [address]  Glucose, capillary     Status: Abnormal   Collection Time: 11/03/23  6:17 AM  Result Value Ref Range   Glucose-Capillary 159 (H) 70 - 99 mg/dL    Comment: Glucose reference range applies only to samples taken after fasting for at least 8 hours.   Comment 1 Notify RN   Glucose, capillary     Status: Abnormal   Collection Time: 11/03/23 11:58 AM  Result Value Ref Range   Glucose-Capillary 215 (H) 70 - 99 mg/dL    Comment: Glucose reference range applies only to samples taken after fasting for at least 8 hours.  Glucose, capillary     Status: Abnormal   Collection Time: 11/03/23  4:02 PM  Result Value Ref Range   Glucose-Capillary 191 (H) 70 - 99 mg/dL    Comment: Glucose reference range applies only to samples taken after fasting for at least 8 hours.  Glucose, capillary     Status: Abnormal   Collection Time: 11/03/23  9:28 PM  Result Value Ref Range   Glucose-Capillary 293 (H) 70 - 99 mg/dL    Comment: Glucose reference range applies only to samples taken after fasting for at least 8 hours.   Comment 1 Notify RN    Comment 2 Document in Chart   Glucose, capillary     Status: Abnormal   Collection Time: 11/04/23  6:04 AM  Result Value Ref Range   Glucose-Capillary 146 (H) 70 - 99 mg/dL    Comment: Glucose reference range applies only to samples taken after fasting for at least 8 hours.   Comment 1 Notify RN    Comment 2 Document in Chart   Glucose, capillary     Status: Abnormal   Collection Time: 11/04/23  8:36 AM  Result Value Ref Range   Glucose-Capillary 167 (H) 70 - 99 mg/dL  Comment: Glucose reference range applies only to samples taken after fasting for at least 8 hours.   Comment 1 Notify RN    Comment 2 Document in Chart   Glucose, capillary     Status:  Abnormal   Collection Time: 11/04/23 11:34 AM  Result Value Ref Range   Glucose-Capillary 247 (H) 70 - 99 mg/dL    Comment: Glucose reference range applies only to samples taken after fasting for at least 8 hours.   Comment 1 Notify RN    Comment 2 Document in Chart    No results found.    Blood pressure 129/79, pulse 63, temperature 98.2 F (36.8 C), temperature source Oral, resp. rate 18, height 5\' 6"  (1.676 m), weight 95.3 kg, SpO2 91%.  Medical Problem List and Plan: 1. Functional deficits secondary to punctate infarct in the cerebellar vermis  -patient may *** shower  -ELOS/Goals: *** 2.  Antithrombotics: -DVT/anticoagulation:  Pharmaceutical: Lovenox   -antiplatelet therapy: Aspirin  81 mg daily and Plavix  75 mg day x 3 weeks then Plavix  3. Pain Management: Tylenol  as needed 4. Mood/Behavior/Sleep: Provide emotional support  -antipsychotic agents: N/A 5. Neuropsych/cognition: This patient is capable of making decisions on her own behalf. 6. Skin/Wound Care: Routine skin checks 7. Fluids/Electrolytes/Nutrition: Routine in and outs with follow-up chemistries 8.  Hypertension.  Coreg  3.125 mg twice daily.  Monitor with increased mobility 9.  Diastolic congestive heart care/ischemic cardiomyopathy.  Echocardiogram ejection fraction 30 to 35%.  Continue Demadex  20 mg daily.  Amiodarone  recently discontinued per cardiology services/Dr. Jesse Moritz..  Monitor for any signs of fluid overload 10.  COPD/remote tobacco use.  Continue inhalers.  Check oxygen saturations every shift 11.  CAD status post stent status post ICD.  Farxiga  and Entresto  currently on hold.  Continue as needed 12.  Non-insulin -dependent diabetes mellitus.  Hemoglobin A1c 6.4.  SSI.  Farxiga  on hold.  Resume as needed 13.  Obesity.  BMI 33.91.  Dietary follow-up Sterling Eisenmenger, PA-C 11/04/2023

## 2023-11-05 NOTE — Discharge Summary (Signed)
 Physician Discharge Summary  Patient ID: [name]. MRN: [id number] DOB/AGE: 10-Jun-1956 68 y.o.  Admit date: 11/04/2023 Discharge date: 11/04/2023  Discharge Diagnoses:  Principal Problem:   Dizziness Active Problems:   Essential hypertension   History of CVA (cerebrovascular accident)   HFrEF (heart failure with reduced ejection fraction) (HCC)   History of CAD (coronary artery disease)   Non-insulin  dependent type 2 diabetes mellitus (HCC)   History of COPD   GAD (generalized anxiety disorder)   Discharged Condition: Stable  Significant Diagnostic Studies: ECHOCARDIOGRAM COMPLETE Result Date: 11/01/2023    ECHOCARDIOGRAM REPORT   Patient Name:   [name]. Date of Exam: 11/01/2023 Medical Rec #:  [id number]         Height:       66.0 in Accession #:    [id number]        Weight:       210.1 lb Date of Birth:  Nov 11, 1955          BSA:          2.042 m Patient Age:    68 years          BP:           125/87 mmHg Patient Gender: M                 HR:           66 bpm. Exam Location:  Inpatient Procedure: 2D Echo, Color Doppler, Cardiac Doppler and Intracardiac            Opacification Agent (Both Spectral and Color Flow Doppler were            utilized during procedure). Indications:    Stroke  History:        Patient has prior history of Echocardiogram examinations.                 Cardiomyopathy and CHF, CAD, Stroke, Arrythmias:Pacemaker; Risk                 Factors:Former Smoker, Sleep Apnea and Diabetes.  Sonographer:    Willey Harrier Referring Phys: [id number] JINDONG XU IMPRESSIONS  1. There is no left ventricular thrombus (Definity  contrast was used). Left ventricular ejection fraction, by estimation, is 30 to 35%. The left ventricle has moderately decreased function. The left ventricle demonstrates regional wall motion abnormalities (see scoring diagram/findings for description). The left ventricular internal cavity size was moderately dilated. Left ventricular diastolic parameters  are consistent with Grade I diastolic dysfunction (impaired relaxation). There is akinesis of the left ventricular, basal-mid inferior wall and inferolateral wall. There is moderate hypokinesis of the left ventricular, entire apical segment.  2. Right ventricular systolic function is mildly reduced. The right ventricular size is normal.  3. Left atrial size was mildly dilated.  4. Right atrial size was mildly dilated.  5. The mitral valve is normal in structure. Mild to moderate mitral valve regurgitation. No evidence of mitral stenosis.  6. The aortic valve is tricuspid. There is mild calcification of the aortic valve. Aortic valve regurgitation is not visualized. Aortic valve sclerosis/calcification is present, without any evidence of aortic stenosis. Comparison(s): Prior images reviewed side by side. The left ventricular function is unchanged. The left ventricular diastolic function has improved. The left ventricular wall motion abnormalities are unchanged. Left heart filling pressures are no longer elevated and mitral insufficiency appears less severe. FINDINGS  Left Ventricle: There is no left ventricular thrombus (Definity  contrast was  used). Left ventricular ejection fraction, by estimation, is 30 to 35%. The left ventricle has moderately decreased function. The left ventricle demonstrates regional wall motion abnormalities. Moderate hypokinesis of the left ventricular, entire apical segment. The left ventricular internal cavity size was moderately dilated. There is no left ventricular hypertrophy. Left ventricular diastolic parameters are consistent with Grade I diastolic dysfunction (impaired relaxation). Normal left ventricular filling pressure.  LV Wall Scoring: The inferior wall, posterior wall, and basal anterolateral segment are akinetic. The apical septal segment, apical anterior segment, apical inferior segment, and apex are hypokinetic. The anterior wall, anterior septum, apical lateral segment, mid  anterolateral segment, mid inferoseptal segment, and basal inferoseptal segment are normal. Right Ventricle: The right ventricular size is normal. No increase in right ventricular wall thickness. Right ventricular systolic function is mildly reduced. Left Atrium: Left atrial size was mildly dilated. Right Atrium: Right atrial size was mildly dilated. Pericardium: There is no evidence of pericardial effusion. Mitral Valve: The mitral valve is normal in structure. Mild to moderate mitral valve regurgitation, with centrally-directed jet. No evidence of mitral valve stenosis. MV peak gradient, 4.4 mmHg. The mean mitral valve gradient is 1.0 mmHg. Tricuspid Valve: The tricuspid valve is normal in structure. Tricuspid valve regurgitation is not demonstrated. Aortic Valve: The aortic valve is tricuspid. There is mild calcification of the aortic valve. Aortic valve regurgitation is not visualized. Aortic valve sclerosis/calcification is present, without any evidence of aortic stenosis. Aortic valve peak gradient measures 4.6 mmHg. Pulmonic Valve: The pulmonic valve was grossly normal. Pulmonic valve regurgitation is not visualized. No evidence of pulmonic stenosis. Aorta: The aortic root and ascending aorta are structurally normal, with no evidence of dilitation. IAS/Shunts: No atrial level shunt detected by color flow Doppler. Additional Comments: A device lead is visualized in the right ventricle and right atrium.  LEFT VENTRICLE PLAX 2D LVIDd:         6.50 cm   Diastology LVIDs:         6.00 cm   LV e' medial:    2.63 cm/s LV PW:         0.70 cm   LV E/e' medial:  13.3 LV IVS:        0.90 cm   LV e' lateral:   3.29 cm/s LVOT diam:     2.00 cm   LV E/e' lateral: 10.7 LV SV:         52 LV SV Index:   26 LVOT Area:     3.14 cm  RIGHT VENTRICLE             IVC RV Basal diam:  4.00 cm     IVC diam: 1.60 cm RV S prime:     10.50 cm/s TAPSE (M-mode): 1.7 cm LEFT ATRIUM             Index        RIGHT ATRIUM           Index LA  Vol (A2C):   68.6 ml 33.59 ml/m  RA Area:     19.30 cm LA Vol (A4C):   48.1 ml 23.55 ml/m  RA Volume:   62.30 ml  30.51 ml/m LA Biplane Vol: 59.5 ml 29.14 ml/m  AORTIC VALVE AV Area (Vmax): 2.62 cm AV Vmax:        107.00 cm/s AV Peak Grad:   4.6 mmHg LVOT Vmax:      89.40 cm/s LVOT Vmean:     59.100 cm/s LVOT VTI:  0.166 m  AORTA Ao Root diam: 3.50 cm Ao Asc diam:  3.40 cm MITRAL VALVE MV Area (PHT): 2.29 cm    SHUNTS MV Area VTI:   2.05 cm    Systemic VTI:  0.17 m MV Peak grad:  4.4 mmHg    Systemic Diam: 2.00 cm MV Mean grad:  1.0 mmHg MV Vmax:       1.05 m/s MV Vmean:      53.3 cm/s MV Decel Time: 331 msec MR Peak grad: 101.6 mmHg MR Mean grad: 63.0 mmHg MR Vmax:      504.00 cm/s MR Vmean:     373.0 cm/s MV E velocity: 35.10 cm/s MV A velocity: 99.00 cm/s MV E/A ratio:  0.35 Mihai Croitoru MD Electronically signed by Luana Rumple MD Signature Date/Time: 11/01/2023/11:38:30 AM    Final    MR BRAIN WO CONTRAST Result Date: 10/31/2023 CLINICAL DATA:  Stroke, follow up.  Dizziness. EXAM: MRI HEAD WITHOUT CONTRAST MRA HEAD WITHOUT CONTRAST MRA NECK WITHOUT AND WITH CONTRAST TECHNIQUE: Multiplanar, multi-echo pulse sequences of the brain and surrounding structures were acquired without intravenous contrast. Angiographic images of the Circle of Willis were acquired using MRA technique without intravenous contrast. Angiographic images of the neck were acquired using MRA technique without and with intravenous contrast. Carotid stenosis measurements (when applicable) are obtained utilizing NASCET criteria, using the distal internal carotid diameter as the denominator. CONTRAST:  9mL GADAVIST  GADOBUTROL  1 MMOL/ML IV SOLN COMPARISON:  CT head 10/30/2023. MRI head, MRA head, and MRA neck 12/26/2021. FINDINGS: MRI HEAD FINDINGS Brain: There is a punctate acute infarct in the cerebellar vermis (series 5, image 70). Scattered chronic cerebral and cerebellar microhemorrhages have mildly increased in number from  the prior MRI and may reflect the sequelae of chronic hypertension. Patchy to confluent T2 hyperintensities in the cerebral white matter bilaterally are similar to the prior MRI and are nonspecific but compatible with moderate to severe chronic small vessel ischemic disease. Chronic lacunar infarcts are again seen in the cerebral white matter bilaterally, right basal ganglia, left thalamus, and both cerebellar hemispheres. There is mild cerebral atrophy. No mass, midline shift, or extra-axial fluid collection is identified. Vascular: Major intracranial arterial flow voids are preserved. Skull and upper cervical spine: Unremarkable bone marrow signal. Sinuses/Orbits: Right cataract extraction. 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 Clear mastoid air cells. Other: None. MRA HEAD FINDINGS Anterior circulation: The internal carotid arteries are widely patent from skull base to carotid termini. ACAs and MCAs are patent without evidence of a proximal branch occlusion or significant proximal stenosis. No aneurysm is identified. Posterior circulation: The included portions of the intracranial vertebral arteries are widely patent to the basilar with the left being slightly dominant. Patent left PICA, right AICA, and bilateral SCA origins are visualized. The basilar artery is patent with a mild stenosis in its midportion. There are left larger than right posterior communicating arteries. Both PCAs are patent without evidence of a high-grade proximal stenosis. No aneurysm is identified. Anatomic variants: None. MRA NECK FINDINGS Evaluation is limited by contrast timing and motion artifact. Aortic arch: Normal variant aortic arch branching pattern with the left vertebral artery arising directly from the arch. Right carotid system: Patent without evidence of a significant stenosis within study limitations. Left carotid system: Patent without evidence of a significant stenosis within study limitations. Vertebral  arteries: Patent with antegrade flow bilaterally. Nondiagnostic assessment for stenosis in the V1 segments due to study limitations. No evidence of a high-grade stenosis in  the V2 or V3 segments allowing for intermittent motion artifact. IMPRESSION: 1. Punctate acute infarct in the cerebellar vermis. 2. Moderate to severe chronic small vessel ischemic disease. 3. Mild basilar artery stenosis. Otherwise unremarkable head MRA. 4. Limited MRA of the neck due to contrast timing and motion. No evidence of a high-grade stenosis of the common carotid arteries, cervical internal carotid arteries, or V2 or V3 segments of the vertebral arteries. Electronically Signed   By: Aundra Lee [name]   On: 10/31/2023 13:27   MR ANGIO HEAD WO CONTRAST Result Date: 10/31/2023 CLINICAL DATA:  Stroke, follow up.  Dizziness. EXAM: MRI HEAD WITHOUT CONTRAST MRA HEAD WITHOUT CONTRAST MRA NECK WITHOUT AND WITH CONTRAST TECHNIQUE: Multiplanar, multi-echo pulse sequences of the brain and surrounding structures were acquired without intravenous contrast. Angiographic images of the Circle of Willis were acquired using MRA technique without intravenous contrast. Angiographic images of the neck were acquired using MRA technique without and with intravenous contrast. Carotid stenosis measurements (when applicable) are obtained utilizing NASCET criteria, using the distal internal carotid diameter as the denominator. CONTRAST:  9mL GADAVIST  GADOBUTROL  1 MMOL/ML IV SOLN COMPARISON:  CT head 10/30/2023. MRI head, MRA head, and MRA neck 12/26/2021. FINDINGS: MRI HEAD FINDINGS Brain: There is a punctate acute infarct in the cerebellar vermis (series 5, image 70). Scattered chronic cerebral and cerebellar microhemorrhages have mildly increased in number from the prior MRI and may reflect the sequelae of chronic hypertension. Patchy to confluent T2 hyperintensities in the cerebral white matter bilaterally are similar to the prior MRI and are nonspecific  but compatible with moderate to severe chronic small vessel ischemic disease. Chronic lacunar infarcts are again seen in the cerebral white matter bilaterally, right basal ganglia, left thalamus, and both cerebellar hemispheres. There is mild cerebral atrophy. No mass, midline shift, or extra-axial fluid collection is identified. Vascular: Major intracranial arterial flow voids are preserved. Skull and upper cervical spine: Unremarkable bone marrow signal. Sinuses/Orbits: Right cataract extraction. 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 Clear mastoid air cells. Other: None. MRA HEAD FINDINGS Anterior circulation: The internal carotid arteries are widely patent from skull base to carotid termini. ACAs and MCAs are patent without evidence of a proximal branch occlusion or significant proximal stenosis. No aneurysm is identified. Posterior circulation: The included portions of the intracranial vertebral arteries are widely patent to the basilar with the left being slightly dominant. Patent left PICA, right AICA, and bilateral SCA origins are visualized. The basilar artery is patent with a mild stenosis in its midportion. There are left larger than right posterior communicating arteries. Both PCAs are patent without evidence of a high-grade proximal stenosis. No aneurysm is identified. Anatomic variants: None. MRA NECK FINDINGS Evaluation is limited by contrast timing and motion artifact. Aortic arch: Normal variant aortic arch branching pattern with the left vertebral artery arising directly from the arch. Right carotid system: Patent without evidence of a significant stenosis within study limitations. Left carotid system: Patent without evidence of a significant stenosis within study limitations. Vertebral arteries: Patent with antegrade flow bilaterally. Nondiagnostic assessment for stenosis in the V1 segments due to study limitations. No evidence of a high-grade stenosis in the V2 or V3 segments  allowing for intermittent motion artifact. IMPRESSION: 1. Punctate acute infarct in the cerebellar vermis. 2. Moderate to severe chronic small vessel ischemic disease. 3. Mild basilar artery stenosis. Otherwise unremarkable head MRA. 4. Limited MRA of the neck due to contrast timing and motion. No evidence of a high-grade stenosis of  the common carotid arteries, cervical internal carotid arteries, or V2 or V3 segments of the vertebral arteries. Electronically Signed   By: Aundra Lee [name]   On: 10/31/2023 13:27   MR ANGIO NECK W WO CONTRAST Result Date: 10/31/2023 CLINICAL DATA:  Stroke, follow up.  Dizziness. EXAM: MRI HEAD WITHOUT CONTRAST MRA HEAD WITHOUT CONTRAST MRA NECK WITHOUT AND WITH CONTRAST TECHNIQUE: Multiplanar, multi-echo pulse sequences of the brain and surrounding structures were acquired without intravenous contrast. Angiographic images of the Circle of Willis were acquired using MRA technique without intravenous contrast. Angiographic images of the neck were acquired using MRA technique without and with intravenous contrast. Carotid stenosis measurements (when applicable) are obtained utilizing NASCET criteria, using the distal internal carotid diameter as the denominator. CONTRAST:  9mL GADAVIST  GADOBUTROL  1 MMOL/ML IV SOLN COMPARISON:  CT head 10/30/2023. MRI head, MRA head, and MRA neck 12/26/2021. FINDINGS: MRI HEAD FINDINGS Brain: There is a punctate acute infarct in the cerebellar vermis (series 5, image 70). Scattered chronic cerebral and cerebellar microhemorrhages have mildly increased in number from the prior MRI and may reflect the sequelae of chronic hypertension. Patchy to confluent T2 hyperintensities in the cerebral white matter bilaterally are similar to the prior MRI and are nonspecific but compatible with moderate to severe chronic small vessel ischemic disease. Chronic lacunar infarcts are again seen in the cerebral white matter bilaterally, right basal ganglia, left  thalamus, and both cerebellar hemispheres. There is mild cerebral atrophy. No mass, midline shift, or extra-axial fluid collection is identified. Vascular: Major intracranial arterial flow voids are preserved. Skull and upper cervical spine: Unremarkable bone marrow signal. Sinuses/Orbits: Right cataract extraction. 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 Clear mastoid air cells. Other: None. MRA HEAD FINDINGS Anterior circulation: The internal carotid arteries are widely patent from skull base to carotid termini. ACAs and MCAs are patent without evidence of a proximal branch occlusion or significant proximal stenosis. No aneurysm is identified. Posterior circulation: The included portions of the intracranial vertebral arteries are widely patent to the basilar with the left being slightly dominant. Patent left PICA, right AICA, and bilateral SCA origins are visualized. The basilar artery is patent with a mild stenosis in its midportion. There are left larger than right posterior communicating arteries. Both PCAs are patent without evidence of a high-grade proximal stenosis. No aneurysm is identified. Anatomic variants: None. MRA NECK FINDINGS Evaluation is limited by contrast timing and motion artifact. Aortic arch: Normal variant aortic arch branching pattern with the left vertebral artery arising directly from the arch. Right carotid system: Patent without evidence of a significant stenosis within study limitations. Left carotid system: Patent without evidence of a significant stenosis within study limitations. Vertebral arteries: Patent with antegrade flow bilaterally. Nondiagnostic assessment for stenosis in the V1 segments due to study limitations. No evidence of a high-grade stenosis in the V2 or V3 segments allowing for intermittent motion artifact. IMPRESSION: 1. Punctate acute infarct in the cerebellar vermis. 2. Moderate to severe chronic small vessel ischemic disease. 3. Mild basilar artery  stenosis. Otherwise unremarkable head MRA. 4. Limited MRA of the neck due to contrast timing and motion. No evidence of a high-grade stenosis of the common carotid arteries, cervical internal carotid arteries, or V2 or V3 segments of the vertebral arteries. Electronically Signed   By: Aundra Lee [name]   On: 10/31/2023 13:27   DG Chest 2 View Result Date: 10/30/2023 CLINICAL DATA:  COPD EXAM: CHEST - 2 VIEW COMPARISON:  09/27/2023. FINDINGS: Cardiac silhouette  enlarged. No evidence of pneumothorax or pleural effusion. No evidence of pulmonary edema. No osseous abnormalities identified. There is a left-sided pacer. IMPRESSION: Enlarged cardiac silhouette. Electronically Signed   By: Sydell Eva [name]   On: 10/30/2023 19:47   CT HEAD WO CONTRAST ( ) Result Date: 10/30/2023 CLINICAL DATA:  Headache, neuro deficit EXAM: CT HEAD WITHOUT CONTRAST TECHNIQUE: Contiguous axial images were obtained from the base of the skull through the vertex without intravenous contrast. RADIATION DOSE REDUCTION: This exam was performed according to the departmental dose-optimization program which includes automated exposure control, adjustment of the mA and/or kV according to patient size and/or use of iterative reconstruction technique. COMPARISON:  02/02/2022. FINDINGS: Brain: There is periventricular white matter decreased attenuation consistent with small vessel ischemic changes. Ventricles, sulci and cisterns are prominent consistent with age related involutional changes. No acute intracranial hemorrhage, mass effect or shift. No hydrocephalus. Vascular: No hyperdense vessel or unexpected calcification. Skull: Normal. Negative for fracture or focal lesion. Sinuses/Orbits: Mucoperiosteal thickening consistent with chronic left maxillary and sphenoid sinusitis. IMPRESSION: Atrophy and chronic small vessel ischemic changes. No acute intracranial process identified. Electronically Signed   By: Sydell Eva [name]   On:  10/30/2023 19:46    Labs:  Basic Metabolic Panel: Recent Labs  Lab 10/30/23 1759 10/31/23 0605 11/02/23 0544 11/03/23 0612  NA 135 139 135 138  K 4.1 4.0 3.6 3.8  CL 103 106 102 103  CO2 18* 22 22 23   GLUCOSE 207* 131* 171* 141*  BUN 17 19 24* 20  CREATININE 1.02 1.17 1.27* 1.23  CALCIUM  9.5 9.2 8.7* 8.9  MG  --   --  2.0 2.0    CBC: Recent Labs  Lab 10/31/23 0605 11/02/23 0544 11/03/23 0612  WBC 13.3* 11.5* 13.9*  HGB 16.5 16.8 17.0  HCT 47.7 48.6 48.9  MCV 85.8 85.3 84.3  PLT 220 197 213    CBG: Recent Labs  Lab 11/03/23 2128 11/04/23 0604 11/04/23 0836 11/04/23 1134 11/04/23 1608  GLUCAP 293* 146* 167* 247* 200*    Brief HPI:   [name]. is a 68 y.o. right-handed male with history significant for diabetes mellitus diastolic congestive heart failure ischemic cardiomyopathy ejection fraction of 30 to 35% V. tach status post AICD, CAD status post stenting chronic PVC COPD quit smoking 12 years ago.  Per chart review lives alone independent prior to admission.  Presented 10/31/2023 with unsteady gait lightheadedness and orthostasis as well as shortness of breath and bouts of vomiting.  CT/MRI showed punctate acute infarct in the cerebellar vermis with moderate to severe chronic small vessel ischemic disease mild basilar artery stenosis.  MRA otherwise unremarkable.  Patient did not receive TNK.  Admission chemistries unremarkable except glucose 207 WBC 13,800 BNP 328.  Echocardiogram ejection fraction of 30 to 35% left ventricle demonstrating regional wall motion abnormalities grade 1 diastolic dysfunction.  Patient continue on aspirin  and Plavix  for CVA prophylaxis x 3 weeks then Plavix  alone.  Lovenox  for DVT prophylaxis.  Patient was admitted for a comprehensive rehab program.   Hospital Course: [name]. was admitted to rehab 10/31/2023 for inpatient therapies to consist of PT, ST and OT at least three hours five days a week. Past admission  physiatrist, therapy team and rehab RN have worked together to provide customized collaborative inpatient rehab.  Patient was admitted to inpatient rehab services 11/04/2023 and shortly after admission to inpatient rehab services received MD received a call from nursing regarding patient becoming more  aggressive with staff after being informed that he needs to get nursing assistance to go to the bathroom.  Patient immediately called a family member to pick him up and demanded to leave AMA, indicating that rehab was similar to a prison and he was being held against his will.  He was not amenable to redirection or reconsideration by staff.  Patient had cognitive capacity to make medical decisions and was thus released per his request AMA.   Blood pressures were monitored on TID basis and remained controlled and monitored  Diabetes has been monitored with ac/hs CBG checks and SSI was use prn for tighter BS control.    Rehab course: During patient's stay in rehab weekly team conferences were held to monitor patient's progress, set goals and discuss barriers to discharge. At admission, patient required minimal assist 200 feet 1 person hand-held assist  He/She  has had improvement in activity tolerance, balance, postural control as well as ability to compensate for deficits. He/She has had improvement in functional use RUE/LUE  and RLE/LLE as well as improvement in awareness       Disposition: Discharged AMA   Diet:  Special Instructions:  30-35 minutes were spent completing discharge summary and discharge planning  Discharge Instructions     Ambulatory referral to Neurology   Complete by: As directed    Follow up with stroke clinic NP at [facility] in about 4-6 weeks. Thanks.   Diet Carb Modified   Complete by: As directed    Discharge instructions   Complete by: As directed    Follow-up with your primary care provider and [facility] as outpatient.   Increase activity slowly    Complete by: As directed       Allergies as of 11/04/2023       Reactions   Contrast Media [iodinated Contrast Media] Shortness Of Breath   Iohexol  Shortness Of Breath    Onset Date: [id number]   Jardiance  [empagliflozin ] Other (See Comments)   Fatigue/weakness   Atorvastatin  Other (See Comments)   Myalgias   Hydrocodone  Itching   Morphine And Codeine Other (See Comments)   Lost control    Penicillins Other (See Comments)   Unknown reaction   Rosuvastatin  Other (See Comments)   Myalgias        Medication List     STOP taking these medications    Entresto  24-26 MG Generic drug: sacubitril -valsartan    Farxiga  10 MG Tabs tablet Generic drug: dapagliflozin  propanediol       TAKE these medications    acetaminophen  325 MG tablet Commonly known as: TYLENOL  Take 2 tablets (650 mg total) by mouth every 6 (six) hours as needed for mild pain (pain score 1-3) (or Fever >/= 101).   aspirin  EC 81 MG tablet Take 1 tablet (81 mg total) by mouth daily. Swallow whole.   carvedilol  3.125 MG tablet Commonly known as: COREG  Take 1 tablet (3.125 mg total) by mouth 2 (two) times daily with a meal.   clopidogrel  75 MG tablet Commonly known as: PLAVIX  Take 1 tablet (75 mg total) by mouth daily.   docusate sodium  100 MG capsule Commonly known as: COLACE Take 1 capsule (100 mg total) by mouth 2 (two) times daily.   ezetimibe  10 MG tablet Commonly known as: ZETIA  Take 1 tablet (10 mg total) by mouth daily.   insulin  aspart 100 UNIT/ML injection Commonly known as: novoLOG  Inject 0-6 Units into the skin 3 (three) times daily with meals.   insulin  aspart 100  UNIT/ML injection Commonly known as: novoLOG  Inject 0-5 Units into the skin at bedtime.   meclizine  12.5 MG tablet Commonly known as: ANTIVERT  Take 1 tablet (12.5 mg total) by mouth 3 (three) times daily as needed for dizziness.   nitroGLYCERIN  0.4 MG SL tablet Commonly known as: NITROSTAT  Place 1 tablet (0.4 mg total)  under the tongue every 5 (five) minutes as needed for chest pain.   polyethylene glycol 17 g packet Commonly known as: MIRALAX  / GLYCOLAX  Take 17 g by mouth daily.   Stiolto Respimat  2.5-2.5 MCG/ACT Aers Generic drug: Tiotropium Bromide-Olodaterol Inhale 2 puffs into the lungs daily.   torsemide  20 MG tablet Commonly known as: DEMADEX  Take 20 mg by mouth daily.   Ventolin  HFA 108 (90 Base) MCG/ACT inhaler Generic drug: albuterol  Inhale 2 puffs into the lungs every 6 (six) hours as needed for wheezing or shortness of breath.        Follow-up Information     [facility] Follow up.   Why: The office will contact you for an appointment Contact information: [address], [place], [place] [address]  Phone: [phone]         [facility]. Schedule an appointment as soon as possible for a visit in 1 month(s).   Specialty: Neurology Why: stroke clinic Contact information: [address] [place]  [phone]                Signed: Sterling Eisenmenger 11/05/2023, 6:35 PM

## 2023-11-05 NOTE — IPOC Note (Signed)
 Pt left AMA day 1. No IPOC

## 2023-11-07 ENCOUNTER — Encounter: Admitting: Family

## 2023-11-07 NOTE — Progress Notes (Signed)
 Patient came to floor for admission, transfer nurse put patient in bed with the bed alarm on, receiving nurse going down to room to complete admission, hears bed alarm going off, patient in restroom upon arrival, patient comes out of bedroom nurse tells patient please don't get up without assistance due to fall risk. Patient gets agitated, makes a phone call to get picked up, states he's not staying here he is not a prisoner. Nurse calls MD and MD advised to give patient AMA form. Patient signed AMA form and walked off the unit. Per Tobey Forte LPN/Stacey Esther Hem RN (charge)

## 2023-11-07 NOTE — Progress Notes (Signed)
 Inpatient Rehabilitation  Patient information reviewed and entered into eRehab system by [facility]. Karen Kays., CCC/SLP, PPS Coordinator.  Information including medical coding, functional ability and quality indicators will be reviewed and updated through discharge.

## 2023-11-07 NOTE — Progress Notes (Signed)
 Patient ID: [name]., male   DOB: [birth year], 68 y.o.   MRN: [id number] CSW evaluation was unable to be completed due to patient short length of stay and leaving AMA on day of admission. Pt was admitted and discharged on 5/2

## 2023-11-29 ENCOUNTER — Telehealth: Payer: Self-pay | Admitting: Cardiology

## 2023-11-29 NOTE — Telephone Encounter (Signed)
 Called to confirm/remind patient of their appointment at the [facility] on 11/30/23.   Appointment:   [x] Confirmed  [] Left mess   [] No answer/No voice mail  [] VM Full/unable to leave message  [] Phone not in service  Patient reminded to bring all medications and/or complete list.  Confirmed patient has transportation. Gave directions, instructed to utilize valet parking.

## 2023-11-29 NOTE — Progress Notes (Unsigned)
 ADVANCED HF CLINIC NOTE  Referring Physician: Antionette Kirks, MD Primary Care: Patient, No Pcp Per Primary Cardiologist: Antionette Kirks, MD (last seen 05/24)  Chief Complaint: fatigue   HPI:  [name]. is a 68 y.o. male with CAD, DM2, PVCs, former smoker, previous CVA, HTN, OSA, COPD, VT s/p ICD and chronic systolic HF.  He has known history of CAD s/p  remote MI with previous LAD,diagonal and RCA stenting at [facility]. Cath 2016 showed patent LAD and diagonal stents and chronically occluded RCA and left to right collaterals.  He also has chronic systolic heart failure due to ischemic cardiomyopathy EF 25-30%  Cath 2/21 with 3v CAD with patent stents in the LAD and diagonal without significant restenosis, chronically occluded RCA stents with right to right and left-to-right collaterals and significant stenosis in the distal left circumflex supplying a relatively small OM 3 distribution.  Ejection fraction was 25 to 30%. Right heart catheterization showed normal filling pressures, mild pulmonary hypertension and normal cardiac output.  Medical therapy was recommended.  Had NSTEMI 4/21 Cath showed occluded distal left circumflex which was treated successfully with PCI and drug-eluting stent placement.     He was admitted 06/23 with chest pain and heart failure.  Echo EF 30-35%. Cath showed patent LAD and LCx stents with chronically occluded RCA with L-to-R collaterals.  Medical therapy recommended. He did not fill his prescriptions. He returned to the ED after he was stung by multiple yellow jackets and was found to be in VT at 175 bpm. He was cardioverted with 120 J. He went back into VT and was treated successfully with IV amiodarone .     Admitted 07/23 with VT in the setting of not taking amiodarone  as prescribed. He ultimately underwent ICD placement by Dr. Marven Slimmer in October.  He was seen 12/23. He was not taking ntresto regularly because it dragged him down. Stopped entresto  and  added eplerenone . He stopped eplerenone  because it made him feel anxious and he couldn't sleep. Losartan  as well as sertraline  for anxiety was started.   Sleep study 1/24 with mild OSA (AHI 9)   Echo 02/03/23 EF 30-35% mod MR RV ok.   Had cut back on lasix  and was not taking regularly. Seen on 02/25/23 in Pulmonary office with 6-7 pound weight gain and increased SOB. CXR showed pulmonary edema. BNP elevated, Lasix  restarted at 80 daily (was supposed to be on 40 daily but only taking a couple times/week). Also treated with prednisone  and inhalers for possible COPD flare  Admitted 09/27/23 with shortness of breath due to HF exacerbation due to medication noncompliance. Initial BP 163/116. Chest x-ray with mild vascular congestion. Initially needed oxygen. Cardiology consulted. IV diuresed with transition to oral diuretics.   Was in the ED 10/30/23 with SOB and dizziness. Dizziness is worse w/ movement. Unable to get MRA/MRI (concern for stroke) due to ICD so transported to [facility] 10/31/23. CT/MRI showed punctate acute infarct in the cerebellar vermis with moderate to severe chronic small vessel ischemic disease mild basilar artery stenosis. MRA otherwise unremarkable. Patient did not receive TNK. Echo 11/01/23: EF 30 to 35% left ventricle demonstrating regional wall motion abnormalities grade 1 diastolic dysfunction. CXR negative. Transferred to [facility] inpatient rehab on 11/04/23. He called a family member to come pick him up and left AMA saying inpatient rehab was "like a prison".   He presents today for a HF follow-up visit with a chief complaint of fatigue (worsens as the day progresses). Has  associated back pain and dizziness with sudden position changes. Sleeping well on 2 pillows. Denies shortness of breath, chest pain, palpitations, abdominal distention or pedal edema. Says that ever since he had his stroke, he's been dizzy.   Has been taking entresto  and carvedilol  once daily as he didn't realize  he was supposed to be taking them BID.   ROS: All systems negative except what is listed in HPI, PMH and Problem List   Past Medical History:  Diagnosis Date   Anginal pain (HCC)    Arthritis    CAD (coronary artery disease)    a. s/p MI with LAD and Diag stenting @ [name];  b. 06/2015 Cath: LAD 67m/d ISR, 100 RCA (ISR) w/ L->R collats, otw mod nonobs dzs-->Med Rx; c. 08/2019 Cath: LM nl, LAD 10p/m ISR, D1 20, D2 100, RI min irregs, LCX 49m/d, OM1 100, OM2 50, RCA 100p, 70d. RPDA fills via collats from LAD. EF 25-35%-->Med Rx; d. 10/2019 NSTEMI/Cath: LCX now 100 (2.75x15 Resolute Onyx DES), otw stable compared to 08/2019.   Chronic combined systolic and diastolic CHF (congestive heart failure) (HCC)    a. 06/2015 Echo: EF 20-25%, Gr3 DD; b. 10/2019 Echo: EF 25-30%, glob HK, sev inf/infapical HK. Mod dil LA.   Dyspnea    Essential hypertension    Headache 12/23/2021   Hyperlipidemia    Hypokalemia    a. 06/2015 in setting of diuresis.   Ischemic cardiomyopathy    a. 2011 EF 45% ([name]);  b. 06/2015 Echo: EF 20-25%; c. 04/2019 Echo: EF 40-45%; d. 08/2019 LV gram: EF 25-35%; e. 10/2019 Echo: EF 25-30%.   PVC's (premature ventricular contractions)    a. 09/2019 Zio (3 days): Avg hr 70, 4 runs NSVT, 5 runs SVT, rare PACs, frequent PVCs w/ 11.8% burden.   Sleep apnea    Stroke [facility])    Stroke/Right temporal lobe infarction [facility])    a. 10/2019 MRI brain: 1cm acute ischemic nonhemorrhagic R temporal lobe infarct. Age-related cerebral atrophy w/ moderate chronic small vessel ischemic dzs.   Type 2 diabetes mellitus with hyperglycemia (HCC) 10/08/2019    Current Outpatient Medications  Medication Sig Dispense Refill   acetaminophen  (TYLENOL ) 325 MG tablet Take 2 tablets (650 mg total) by mouth every 6 (six) hours as needed for mild pain (pain score 1-3) (or Fever >/= 101).     albuterol  (VENTOLIN  HFA) 108 (90 Base) MCG/ACT inhaler Inhale 2 puffs into the lungs every 6 (six) hours as needed for wheezing  or shortness of breath. 54 g 2   aspirin  EC 81 MG tablet Take 1 tablet (81 mg total) by mouth daily. Swallow whole. 90 tablet 3   carvedilol  (COREG ) 3.125 MG tablet Take 1 tablet (3.125 mg total) by mouth 2 (two) times daily with a meal. 120 tablet 0   clopidogrel  (PLAVIX ) 75 MG tablet Take 1 tablet (75 mg total) by mouth daily.     docusate sodium  (COLACE) 100 MG capsule Take 1 capsule (100 mg total) by mouth 2 (two) times daily.     ezetimibe  (ZETIA ) 10 MG tablet Take 1 tablet (10 mg total) by mouth daily.     insulin  aspart (NOVOLOG ) 100 UNIT/ML injection Inject 0-6 Units into the skin 3 (three) times daily with meals.     insulin  aspart (NOVOLOG ) 100 UNIT/ML injection Inject 0-5 Units into the skin at bedtime.     meclizine  (ANTIVERT ) 12.5 MG tablet Take 1 tablet (12.5 mg total) by mouth 3 (three) times daily as needed  for dizziness.     nitroGLYCERIN  (NITROSTAT ) 0.4 MG SL tablet Place 1 tablet (0.4 mg total) under the tongue every 5 (five) minutes as needed for chest pain. 90 tablet 3   polyethylene glycol (MIRALAX  / GLYCOLAX ) 17 g packet Take 17 g by mouth daily.     Tiotropium Bromide-Olodaterol (STIOLTO RESPIMAT ) 2.5-2.5 MCG/ACT AERS Inhale 2 puffs into the lungs daily.     torsemide  (DEMADEX ) 20 MG tablet Take 20 mg by mouth daily.     No current facility-administered medications for this visit.    Allergies  Allergen Reactions   Contrast Media [Iodinated Contrast Media] Shortness Of Breath   Iohexol  Shortness Of Breath     Onset Date: [id number]    Jardiance  [Empagliflozin ] Other (See Comments)    Fatigue/weakness   Atorvastatin  Other (See Comments)    Myalgias    Hydrocodone  Itching   Morphine And Codeine Other (See Comments)    Lost control    Penicillins Other (See Comments)    Unknown reaction   Rosuvastatin  Other (See Comments)    Myalgias       Social History   Socioeconomic History   Marital status: Widowed    Spouse name: Not on file   Number of children: 2    Years of education: Not on file   Highest education level: GED or equivalent  Occupational History   Occupation: Statistician   Tobacco Use   Smoking status: Former    Current packs/day: 0.00    Types: Cigarettes    Quit date: 06/10/2011    Years since quitting: 12.4   Smokeless tobacco: Never  Vaping Use   Vaping status: Not on file  Substance and Sexual Activity   Alcohol use: No    Alcohol/week: 0.0 standard drinks of alcohol   Drug use: No   Sexual activity: Not Currently  Other Topics Concern   Not on file  Social History Narrative   Lives alone.   Social Drivers of Health   Financial Resource Strain: Medium Risk (09/29/2023)   Overall Financial Resource Strain (CARDIA)    Difficulty of Paying Living Expenses: Somewhat hard  Food Insecurity: No Food Insecurity (10/31/2023)   Hunger Vital Sign    Worried About Running Out of Food in the Last Year: Never true    Ran Out of Food in the Last Year: Never true  Transportation Needs: No Transportation Needs (10/31/2023)   PRAPARE - Administrator, Civil Service (Medical): No    Lack of Transportation (Non-Medical): No  Physical Activity: Inactive (04/30/2019)   Exercise Vital Sign    Days of Exercise per Week: 0 days    Minutes of Exercise per Session: 0 min  Stress: No Stress Concern Present (04/30/2019)   [employer] of Occupational Health - Occupational Stress Questionnaire    Feeling of Stress : Not at all  Social Connections: Moderately Isolated (10/31/2023)   Social Connection and Isolation Panel [NHANES]    Frequency of Communication with Friends and Family: More than three times a week    Frequency of Social Gatherings with Friends and Family: More than three times a week    Attends Religious Services: Never    Database administrator or Organizations: Yes    Attends Banker Meetings: Never    Marital Status: Divorced  Catering manager Violence: Not At Risk (10/31/2023)   Humiliation,  Afraid, Rape, and Kick questionnaire    Fear of Current or Ex-Partner: No  Emotionally Abused: No    Physically Abused: No    Sexually Abused: No      Family History  Problem Relation Age of Onset   Coronary artery disease Mother    Diabetes Mother    Coronary artery disease Father     Vitals:   11/30/23 1046 11/30/23 1047  BP: (!) 140/88   Pulse: 63 62  SpO2: 94% 96%  Weight: 202 lb (91.6 kg)    Wt Readings from Last 3 Encounters:  11/30/23 202 lb (91.6 kg)  10/31/23 210 lb 1.6 oz (95.3 kg)  10/30/23 210 lb (95.3 kg)   Lab Results  Component Value Date   CREATININE 0.94 11/30/2023   CREATININE 1.23 11/03/2023   CREATININE 1.27 (H) 11/02/2023    PHYSICAL EXAM:  General: Well appearing. No resp difficulty HEENT: normal Neck: supple, no JVD Cor: Regular rhythm, rate. No rubs, gallops or murmurs Lungs: clear Abdomen: soft, nontender, nondistended. Extremities: no cyanosis, clubbing, rash, edema Neuro: alert & oriented X 3. Moves all 4 extremities w/o difficulty. Affect pleasant   EKG: not done  Medtronic device interrogated: Optivol fluid < threshold, impedence below reference,  no AF/ VT events   ASSESSMENT & PLAN:  1.  Chronic systolic heart failure due to iCM - Echo 6/23 EF 30-35%.   - Echo 02/03/23 EF 30-35% mod MR RV ok.  - Echo 11/01/23: EF 30 to 35% left ventricle demonstrating regional wall motion abnormalities grade 1 diastolic dysfunction. - s/p MDT ICD - NYHA class II - euvolemic - weight stable from last visit here 2 months ago - begin carvedilol  3.125mg  BID; (he has been taking daily) - continue farxiga  10mg  daily - continue furosemide  40mg  daily - begin entresto  24/26mg  BID; (he has been taking daily) - Unable to tolerate eplerenone  due to anxiety.  - hesitant to use MRA due to compliance - BNP 10/30/23 was 328.2  2. CAD  - s/p remote MI. S/p PCI LAD, LCx and RCA - Cath 6/23: patent LAD and left circumflex stents with chronically  occluded right coronary artery with left-to-right collaterals.  Medical therapy was recommended. - No s/s angina - continue ASA 81mg  daily - doesn't tolerate statins, continue ezetimibe  10mg  daily - saw cardiology Alvenia Aus) 05/24  3  HTN:  - BP 140/88 - BMET 11/03/23 reviewed: sodium 138, potassium 3.8, creatinine 1.23 & GFR >60 - BMET today  4. H/o ventricular tachycardia: - Status post ICD placement.   - saw EP (Riddle) 01/25 - Mg today  5. H/o PVCs - Zio 3/21: Avg hr 70, 4 runs NSVT, 5 runs SVT, rare PACs, frequent PVCs w/ 11.8% burden.  6. Mild OSA - Sleep study 1/24 with mild OSA (AHI 9)  - saw pulmonology Viva Grise) 03/25   Return in 2 months, sooner if needed.   Charlette Console, FNP  3:11 PM 11/29/23

## 2023-11-30 ENCOUNTER — Encounter: Payer: Self-pay | Admitting: Family

## 2023-11-30 ENCOUNTER — Ambulatory Visit: Payer: Self-pay | Admitting: Family

## 2023-11-30 ENCOUNTER — Other Ambulatory Visit
Admission: RE | Admit: 2023-11-30 | Discharge: 2023-11-30 | Disposition: A | Discharge status: Home / Self Care | Source: Ambulatory Visit | Attending: Family | Admitting: Family

## 2023-11-30 ENCOUNTER — Ambulatory Visit: Admitting: Family

## 2023-11-30 VITALS — BP 140/88 | HR 62 | Wt 202.0 lb

## 2023-11-30 DIAGNOSIS — I5022 Chronic systolic (congestive) heart failure: Secondary | ICD-10-CM | POA: Diagnosis not present

## 2023-11-30 DIAGNOSIS — E119 Type 2 diabetes mellitus without complications: Secondary | ICD-10-CM | POA: Diagnosis not present

## 2023-11-30 DIAGNOSIS — Z8673 Personal history of transient ischemic attack (TIA), and cerebral infarction without residual deficits: Secondary | ICD-10-CM | POA: Diagnosis not present

## 2023-11-30 DIAGNOSIS — I493 Ventricular premature depolarization: Secondary | ICD-10-CM

## 2023-11-30 DIAGNOSIS — J449 Chronic obstructive pulmonary disease, unspecified: Secondary | ICD-10-CM | POA: Insufficient documentation

## 2023-11-30 DIAGNOSIS — I472 Ventricular tachycardia, unspecified: Secondary | ICD-10-CM

## 2023-11-30 DIAGNOSIS — Z87891 Personal history of nicotine dependence: Secondary | ICD-10-CM | POA: Diagnosis not present

## 2023-11-30 DIAGNOSIS — Z91148 Patient's other noncompliance with medication regimen for other reason: Secondary | ICD-10-CM | POA: Diagnosis not present

## 2023-11-30 DIAGNOSIS — Z95810 Presence of automatic (implantable) cardiac defibrillator: Secondary | ICD-10-CM | POA: Insufficient documentation

## 2023-11-30 DIAGNOSIS — I2510 Atherosclerotic heart disease of native coronary artery without angina pectoris: Secondary | ICD-10-CM | POA: Insufficient documentation

## 2023-11-30 DIAGNOSIS — I110 Hypertensive heart disease with heart failure: Secondary | ICD-10-CM | POA: Insufficient documentation

## 2023-11-30 DIAGNOSIS — G4733 Obstructive sleep apnea (adult) (pediatric): Secondary | ICD-10-CM | POA: Insufficient documentation

## 2023-11-30 DIAGNOSIS — Z5986 Financial insecurity: Secondary | ICD-10-CM | POA: Diagnosis not present

## 2023-11-30 DIAGNOSIS — Z955 Presence of coronary angioplasty implant and graft: Secondary | ICD-10-CM | POA: Diagnosis not present

## 2023-11-30 DIAGNOSIS — F419 Anxiety disorder, unspecified: Secondary | ICD-10-CM | POA: Insufficient documentation

## 2023-11-30 DIAGNOSIS — I255 Ischemic cardiomyopathy: Secondary | ICD-10-CM | POA: Diagnosis not present

## 2023-11-30 DIAGNOSIS — I252 Old myocardial infarction: Secondary | ICD-10-CM | POA: Insufficient documentation

## 2023-11-30 DIAGNOSIS — I10 Essential (primary) hypertension: Secondary | ICD-10-CM | POA: Diagnosis not present

## 2023-11-30 DIAGNOSIS — Z7982 Long term (current) use of aspirin: Secondary | ICD-10-CM | POA: Insufficient documentation

## 2023-11-30 DIAGNOSIS — Z79899 Other long term (current) drug therapy: Secondary | ICD-10-CM | POA: Insufficient documentation

## 2023-11-30 DIAGNOSIS — I272 Pulmonary hypertension, unspecified: Secondary | ICD-10-CM | POA: Insufficient documentation

## 2023-11-30 LAB — BASIC METABOLIC PANEL WITH GFR
Anion gap: 8 (ref 5–15)
BUN: 12 mg/dL (ref 8–23)
CO2: 25 mmol/L (ref 22–32)
Calcium: 9.3 mg/dL (ref 8.9–10.3)
Chloride: 106 mmol/L (ref 98–111)
Creatinine, Ser: 0.94 mg/dL (ref 0.61–1.24)
GFR, Estimated: >60 mL/min (ref >60)
Glucose, Bld: 247 mg/dL — ABNORMAL HIGH (ref 70–99)
Potassium: 4.5 mmol/L (ref 3.5–5.1)
Sodium: 139 mmol/L (ref 135–145)

## 2023-11-30 LAB — MAGNESIUM: Magnesium: 2 mg/dL (ref 1.7–2.4)

## 2023-11-30 NOTE — Patient Instructions (Addendum)
 Take your entresto  and carvedilol  as 1 tablet every morning and 1 tablet every evening.    Lab Work:  Go DOWN to LOWER LEVEL (LL) to have your blood work completed inside of [employer] office.  We will only call you if the results are abnormal or if the provider would like to make medication changes.   Follow-Up in: Please follow up with the [facility] in 2 months with Dr. Bruce Caper. We do not currently have that schedule. Please give us  a call in late June in order to schedule your appointment for late July.  At the [facility], you and your health needs are our priority. We have a designated team specialized in the treatment of Heart Failure. This Care Team includes your primary Heart Failure Specialized Cardiologist (physician), Advanced Practice Providers (APPs- Physician Assistants and Nurse Practitioners), and Pharmacist who all work together to provide you with the care you need, when you need it.   You may see any of the following providers on your designated Care Team at your next follow up:  Dr. Jules Oar Dr. Peder Bourdon Dr. Alwin Baars Dr. Judyth Nunnery Shawnee Dellen, FNP Bevely Brush, RPH-CPP  Please be sure to bring in all your medications bottles to every appointment.   Need to Contact Us :  If you have any questions or concerns before your next appointment please send us  a message through [place] or call our office at [phone].    TO LEAVE A MESSAGE FOR THE NURSE SELECT OPTION 2, PLEASE LEAVE A MESSAGE INCLUDING: YOUR NAME DATE OF BIRTH CALL BACK NUMBER REASON FOR CALL**this is important as we prioritize the call backs  YOU WILL RECEIVE A CALL BACK THE SAME DAY AS LONG AS YOU CALL BEFORE 4:00 PM

## 2023-12-01 ENCOUNTER — Ambulatory Visit: Admitting: Neurology

## 2023-12-01 ENCOUNTER — Other Ambulatory Visit: Payer: Self-pay | Admitting: Neurology

## 2023-12-01 ENCOUNTER — Encounter: Payer: Self-pay | Admitting: Neurology

## 2023-12-01 ENCOUNTER — Other Ambulatory Visit: Payer: Self-pay

## 2023-12-01 VITALS — BP 150/88 | HR 68 | Ht 66.0 in | Wt 202.0 lb

## 2023-12-01 DIAGNOSIS — I5022 Chronic systolic (congestive) heart failure: Secondary | ICD-10-CM

## 2023-12-01 DIAGNOSIS — I639 Cerebral infarction, unspecified: Secondary | ICD-10-CM | POA: Diagnosis not present

## 2023-12-01 DIAGNOSIS — E782 Mixed hyperlipidemia: Secondary | ICD-10-CM

## 2023-12-01 DIAGNOSIS — I2510 Atherosclerotic heart disease of native coronary artery without angina pectoris: Secondary | ICD-10-CM

## 2023-12-01 DIAGNOSIS — I10 Essential (primary) hypertension: Secondary | ICD-10-CM | POA: Diagnosis not present

## 2023-12-01 MED ORDER — EZETIMIBE 10 MG PO TABS: 10.0000 mg | ORAL_TABLET | Freq: Every day | ORAL | 5 refills | Status: DC

## 2023-12-01 MED ORDER — MECLIZINE HCL 12.5 MG PO TABS
12.5000 mg | ORAL_TABLET | Freq: Three times a day (TID) | ORAL | 3 refills | Status: DC | PRN
  Filled 2023-12-01: qty 30, 10d supply, fill #0

## 2023-12-01 MED ORDER — CLOPIDOGREL BISULFATE 75 MG PO TABS: 75.0000 mg | ORAL_TABLET | Freq: Every day | ORAL | 5 refills | Status: DC

## 2023-12-01 MED ORDER — CLOPIDOGREL BISULFATE 75 MG PO TABS
75.0000 mg | ORAL_TABLET | Freq: Every day | ORAL | 1 refills | Status: DC
  Filled 2023-12-01: qty 90, 90d supply, fill #0

## 2023-12-01 MED ORDER — EZETIMIBE 10 MG PO TABS
10.0000 mg | ORAL_TABLET | Freq: Every day | ORAL | 5 refills | Status: DC
  Filled 2023-12-01: qty 30, 30d supply, fill #0

## 2023-12-01 NOTE — Progress Notes (Signed)
 Patient: [name]. Date of Birth: [birth year]  Reason for Visit: Stroke Clinic Follow Up  History from: Patient Primary Neurologist: Janett Medin   ASSESSMENT AND PLAN 68 y.o. 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  Presented with lightheadedness, vertigo, vomiting. History of stroke April 2021 with right temporal lobe infarct, June 2023 left frontal white matter infarcts.  History of recurrent VT status post ICD placement.  Vascular risk factors: CHF, CAD, HTN, HLD, type 2 diabetes, mild OSA in 2024. Left AMA from inpatient rehab.   - We spent a lot of time reviewing his medications, as he was not taking several of them. He does not have a primary care doctor.  - He will stop aspirin , switch to Plavix  75 mg daily for secondary stroke prevention - Start Zetia  10 mg daily for hyperlipidemia (statin intolerance), recheck lipid panel, LDL 79 March 2025 - Urgent referral to establish with primary care with multiple co morbidities - Referral to physical therapy for gait and balance - Reordered meclizine  12.5 mg as needed up to 3 times daily for dizziness -Strict management of vascular risk factors with a goal BP less than 130/90, A1c less than 7.0, LDL less than 70 for secondary stroke prevention - Recommended exercise, healthy eating - Follow-up in 6 months or sooner if needed  HISTORY OF PRESENT ILLNESS: Today 12/01/23 Here today alone. Claims only taking aspirin  81 mg daily. Never started Plavix , Zetia , meclizine . He left AMA from inpatient rehab. Has been doing well. If he stands too quickly or turn head may feel lightheaded, but will pass. Saw cardiology CHF,  he is on entrestro, carvedilol . He lives alone. Feels essentially back to normal. He does not have a primary care doctor. 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 BP today 149/95 today, he hasn't taken any medication today. Blind in the  right eye for 30 years.   HISTORY  Presented to the ER with multiple complaints including lightheadedness, vertigo, vomiting.  NIH 1.  History of stroke x 3, recurrent VT status post ICD placement.  Went to [employer], was transferred [facility].  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  Recommended inpatient rehab, he decided to leave AMA from inpatient rehab shortly after arriving.  History of stroke April 2021 with right temporal lobe infarct, June 2023 left frontal white matter infarcts.  -CT head atrophy and chronic small vessel ischemic change. - MRI of the brain punctate acute infarct in the cerebellar vermis - MRA mild basilar artery stenosis.  Otherwise unremarkable MRA head.  MRA neck was limited due to contrast timing and motion.  No evidence of high-grade stenosis - 2D echo LA mildly dilated, EF 30 to 35% - ICD interrogation no A-fib - LDL 79, restarted Zetia , statin intolerance - A1c 6.4 - Aspirin  81 mg daily prior to admission, 3 weeks DAPT aspirin  81 and Plavix  75 then Plavix  alone  REVIEW OF SYSTEMS: Out of a complete 14 system review of symptoms, the patient complains only of the following symptoms, and all other reviewed systems are negative.  See HPI  ALLERGIES: Allergies  Allergen Reactions   Contrast Media [Iodinated Contrast Media] Shortness Of Breath   Empagliflozin  Other (See Comments)    Fatigue/weakness  Other Reaction(s): Not available  empagliflozin    Iohexol  Shortness Of Breath    Onset Date: [id number]  iohexol   Atorvastatin  Other (See Comments)    Myalgias    Atorvastatin  Calcium      Other Reaction(s): Not available  atorvastatin  calcium    Hydrocodone  Itching   Hydrocodone -Acetaminophen      Other Reaction(s): Not available   Morphine     Other Reaction(s): Not available   Morphine And Codeine Other (See Comments)    Lost control    Penicillins Other (See Comments)    Unknown  reaction   Rosuvastatin  Other (See Comments)    Myalgias  Other Reaction(s): Not available  rosuvastatin     HOME MEDICATIONS: Outpatient Medications Prior to Visit  Medication Sig Dispense Refill   acetaminophen  (TYLENOL ) 325 MG tablet Take 2 tablets (650 mg total) by mouth every 6 (six) hours as needed for mild pain (pain score 1-3) (or Fever >/= 101).     aspirin  EC 81 MG tablet Take 1 tablet (81 mg total) by mouth daily. Swallow whole. 90 tablet 3   carvedilol  (COREG ) 3.125 MG tablet Take 1 tablet (3.125 mg total) by mouth 2 (two) times daily with a meal. (Patient taking differently: Take 3.125 mg by mouth daily.) 120 tablet 0   dapagliflozin  propanediol (FARXIGA ) 10 MG TABS tablet Take 10 mg by mouth daily.     furosemide  (LASIX ) 40 MG tablet Take 40 mg by mouth daily.     nitroGLYCERIN  (NITROSTAT ) 0.4 MG SL tablet Place 1 tablet (0.4 mg total) under the tongue every 5 (five) minutes as needed for chest pain. 90 tablet 3   sacubitril -valsartan  (ENTRESTO ) 24-26 MG Take 1 tablet by mouth 2 (two) times daily.     Tiotropium Bromide-Olodaterol (STIOLTO RESPIMAT ) 2.5-2.5 MCG/ACT AERS Inhale 2 puffs into the lungs daily.     albuterol  (VENTOLIN  HFA) 108 (90 Base) MCG/ACT inhaler Inhale 2 puffs into the lungs every 6 (six) hours as needed for wheezing or shortness of breath. 54 g 2   clopidogrel  (PLAVIX ) 75 MG tablet Take 1 tablet (75 mg total) by mouth daily. (Patient not taking: Reported on 11/30/2023)     ezetimibe  (ZETIA ) 10 MG tablet Take 1 tablet (10 mg total) by mouth daily.     insulin  aspart (NOVOLOG ) 100 UNIT/ML injection Inject 0-6 Units into the skin 3 (three) times daily with meals. (Patient not taking: Reported on 11/30/2023)     polyethylene glycol (MIRALAX  / GLYCOLAX ) 17 g packet Take 17 g by mouth daily.     No facility-administered medications prior to visit.    PAST MEDICAL HISTORY: Past Medical History:  Diagnosis Date   Anginal pain (HCC)    Arthritis    CAD  (coronary artery disease)    a. s/p MI with LAD and Diag stenting @ [name];  b. 06/2015 Cath: LAD 49m/d ISR, 100 RCA (ISR) w/ L->R collats, otw mod nonobs dzs-->Med Rx; c. 08/2019 Cath: LM nl, LAD 10p/m ISR, D1 20, D2 100, RI min irregs, LCX 73m/d, OM1 100, OM2 50, RCA 100p, 70d. RPDA fills via collats from LAD. EF 25-35%-->Med Rx; d. 10/2019 NSTEMI/Cath: LCX now 100 (2.75x15 Resolute Onyx DES), otw stable compared to 08/2019.   Chronic combined systolic and diastolic CHF (congestive heart failure) (HCC)    a. 06/2015 Echo: EF 20-25%, Gr3 DD; b. 10/2019 Echo: EF 25-30%, glob HK, sev inf/infapical HK. Mod dil LA.   Dyspnea    Essential hypertension    Headache 12/23/2021   Hyperlipidemia    Hypokalemia    a. 06/2015 in setting of diuresis.   Ischemic cardiomyopathy    a. 2011  EF 45% ([name]);  b. 06/2015 Echo: EF 20-25%; c. 04/2019 Echo: EF 40-45%; d. 08/2019 LV gram: EF 25-35%; e. 10/2019 Echo: EF 25-30%.   PVC's (premature ventricular contractions)    a. 09/2019 Zio (3 days): Avg hr 70, 4 runs NSVT, 5 runs SVT, rare PACs, frequent PVCs w/ 11.8% burden.   Sleep apnea    Stroke [facility])    Stroke/Right temporal lobe infarction [facility])    a. 10/2019 MRI brain: 1cm acute ischemic nonhemorrhagic R temporal lobe infarct. Age-related cerebral atrophy w/ moderate chronic small vessel ischemic dzs.   Type 2 diabetes mellitus with hyperglycemia (HCC) 10/08/2019    PAST SURGICAL HISTORY: Past Surgical History:  Procedure Laterality Date   BACK SURGERY  04/2019   CARDIAC CATHETERIZATION N/A 06/10/2015   Procedure: Left Heart Cath;  Surgeon: Wenona Hamilton, MD;  Location: [facility] INVASIVE CV LAB;  Service: Cardiovascular;  Laterality: N/A;   CORONARY STENT INTERVENTION N/A 10/08/2019   Procedure: CORONARY STENT INTERVENTION;  Surgeon: Wenona Hamilton, MD;  Location: [facility] INVASIVE CV LAB;  Service: Cardiovascular;  Laterality: N/A;   CORONARY STENT PLACEMENT     ICD IMPLANT N/A 04/07/2022   Procedure: ICD IMPLANT;   Surgeon: Boyce Byes, MD;  Location: [facility] INVASIVE CV LAB;  Service: Cardiovascular;  Laterality: N/A;   LEFT HEART CATH AND CORONARY ANGIOGRAPHY N/A 10/08/2019   Procedure: LEFT HEART CATH AND CORONARY ANGIOGRAPHY poss pci;  Surgeon: Wenona Hamilton, MD;  Location: [facility] INVASIVE CV LAB;  Service: Cardiovascular;  Laterality: N/A;   RIGHT HEART CATH AND CORONARY ANGIOGRAPHY N/A 12/30/2021   Procedure: RIGHT HEART CATH AND CORONARY ANGIOGRAPHY;  Surgeon: Sammy Crisp, MD;  Location: [facility] INVASIVE CV LAB;  Service: Cardiovascular;  Laterality: N/A;   RIGHT/LEFT HEART CATH AND CORONARY ANGIOGRAPHY N/A 08/20/2019   Procedure: RIGHT/LEFT HEART CATH AND CORONARY ANGIOGRAPHY;  Surgeon: Wenona Hamilton, MD;  Location: [facility] INVASIVE CV LAB;  Service: Cardiovascular;  Laterality: N/A;    FAMILY HISTORY: Family History  Problem Relation Age of Onset   Coronary artery disease Mother    Diabetes Mother    Coronary artery disease Father     SOCIAL HISTORY: Social History   Socioeconomic History   Marital status: Widowed    Spouse name: Not on file   Number of children: 2   Years of education: Not on file   Highest education level: GED or equivalent  Occupational History   Occupation: Statistician   Tobacco Use   Smoking status: Former    Current packs/day: 0.00    Types: Cigarettes    Quit date: 06/10/2011    Years since quitting: 12.4   Smokeless tobacco: Never  Vaping Use   Vaping status: Not on file  Substance and Sexual Activity   Alcohol use: No    Alcohol/week: 0.0 standard drinks of alcohol   Drug use: No   Sexual activity: Not Currently  Other Topics Concern   Not on file  Social History Narrative   Lives alone.   Social Drivers of Health   Financial Resource Strain: Medium Risk (09/29/2023)   Overall Financial Resource Strain (CARDIA)    Difficulty of Paying Living Expenses: Somewhat hard  Food Insecurity: No Food Insecurity (10/31/2023)   Hunger Vital Sign     Worried About Running Out of Food in the Last Year: Never true    Ran Out of Food in the Last Year: Never true  Transportation Needs: No Transportation Needs (10/31/2023)   PRAPARE -  Administrator, Civil Service (Medical): No    Lack of Transportation (Non-Medical): No  Physical Activity: Inactive (04/30/2019)   Exercise Vital Sign    Days of Exercise per Week: 0 days    Minutes of Exercise per Session: 0 min  Stress: No Stress Concern Present (04/30/2019)   [employer] of Occupational Health - Occupational Stress Questionnaire    Feeling of Stress : Not at all  Social Connections: Moderately Isolated (10/31/2023)   Social Connection and Isolation Panel [NHANES]    Frequency of Communication with Friends and Family: More than three times a week    Frequency of Social Gatherings with Friends and Family: More than three times a week    Attends Religious Services: Never    Database administrator or Organizations: Yes    Attends Banker Meetings: Never    Marital Status: Divorced  Catering manager Violence: Not At Risk (10/31/2023)   Humiliation, Afraid, Rape, and Kick questionnaire    Fear of Current or Ex-Partner: No    Emotionally Abused: No    Physically Abused: No    Sexually Abused: No    PHYSICAL EXAM  Vitals:   12/01/23 1006 12/01/23 1011  BP: (!) 149/95 (!) 150/88  Pulse: 68   Weight: 202 lb (91.6 kg)   Height: 5\' 6"  (1.676 m)    Body mass index is 32.6 kg/m.  Generalized: Well developed, in no acute distress  Neurological examination  Mentation: Alert oriented to time, place, history taking. Follows all commands speech and language fluent Cranial nerve II-XII: Pupils were equal round reactive to light.  Limited right sided vision, reportedly blind to the right eye for over 30 years. Facial sensation and strength were normal. Head turning and shoulder shrug  were normal and symmetric. Motor: The motor testing reveals 5 over 5 strength of  all 4 extremities. Good symmetric motor tone is noted throughout.  Sensory: Sensory testing is intact to soft touch on all 4 extremities. No evidence of extinction is noted.  Coordination: Cerebellar testing reveals good finger-nose-finger and heel-to-shin bilaterally.  Gait and station: When standing, initially feels off balance.  Gait is normal. Tandem gait is unsteady. Not comfortable performing Romberg. Reflexes: Deep tendon reflexes are symmetric and normal bilaterally.   DIAGNOSTIC DATA (LABS, IMAGING, TESTING) - I reviewed patient records, labs, notes, testing and imaging myself where available.  Lab Results  Component Value Date   WBC 13.9 (H) 11/03/2023   HGB 17.0 11/03/2023   HCT 48.9 11/03/2023   MCV 84.3 11/03/2023   PLT 213 11/03/2023      Component Value Date/Time   NA 139 11/30/2023 1128   NA 142 07/13/2023 1520   K 4.5 11/30/2023 1128   CL 106 11/30/2023 1128   CO2 25 11/30/2023 1128   GLUCOSE 247 (H) 11/30/2023 1128   BUN 12 11/30/2023 1128   BUN 20 07/13/2023 1520   CREATININE 0.94 11/30/2023 1128   CALCIUM  9.3 11/30/2023 1128   PROT 6.7 10/31/2023 0605   PROT 7.2 07/13/2023 1520   ALBUMIN 3.4 (L) 10/31/2023 0605   ALBUMIN 4.4 07/13/2023 1520   AST 16 10/31/2023 0605   ALT 11 10/31/2023 0605   ALKPHOS 61 10/31/2023 0605   BILITOT 1.4 (H) 10/31/2023 0605   BILITOT 0.9 07/13/2023 1520   GFRNONAA >60 11/30/2023 1128   GFRAA >60 10/17/2019 0943   Lab Results  Component Value Date   CHOL 135 09/28/2023   HDL 33 (L) 09/28/2023  LDLCALC 79 09/28/2023   TRIG 117 09/28/2023   CHOLHDL 4.1 09/28/2023   Lab Results  Component Value Date   HGBA1C 6.4 (H) 09/27/2023   No results found for: "VITAMINB12" Lab Results  Component Value Date   TSH 1.020 07/13/2023    Jeanmarie Millet, AGNP-C, DNP 12/01/2023, 10:57 AM [facility] [address] [place], [place] [address] [phone]

## 2023-12-01 NOTE — Telephone Encounter (Signed)
 Meds ordered this encounter  Medications   clopidogrel  (PLAVIX ) 75 MG tablet    Sig: Take 1 tablet (75 mg total) by mouth once daily.    Dispense:  90 tablet    Refill:  1   ezetimibe  (ZETIA ) 10 MG tablet    Sig: Take 1 tablet (10 mg total) by mouth daily.    Dispense:  30 tablet    Refill:  5

## 2023-12-01 NOTE — Patient Instructions (Addendum)
 Stop aspirin , switch to Plavix  75 mg daily  Start Zetia  for elevated cholesterol  Strict management of vascular risk factors with a goal BP less than 130/90, A1c less than 7.0, LDL less than 70 for secondary stroke prevention I will refer you to primary care, if you don't hear anything, please let me know!! Referral to physical therapy for balance and gait Follow up in 6 months with me  Meds ordered this encounter  Medications   clopidogrel  (PLAVIX ) 75 MG tablet    Sig: Take 1 tablet (75 mg total) by mouth daily.    Dispense:  30 tablet    Refill:  5   ezetimibe  (ZETIA ) 10 MG tablet    Sig: Take 1 tablet (10 mg total) by mouth daily.    Dispense:  30 tablet    Refill:  5   meclizine  (ANTIVERT ) 12.5 MG tablet    Sig: Take 1 tablet (12.5 mg total) by mouth 3 (three) times daily as needed for dizziness.    Dispense:  30 tablet    Refill:  3

## 2023-12-01 NOTE — Progress Notes (Signed)
 I agree with the above plan

## 2023-12-02 ENCOUNTER — Ambulatory Visit: Payer: Self-pay | Admitting: Neurology

## 2023-12-02 LAB — LIPID PANEL
Chol/HDL Ratio: 4.3 ratio (ref 0.0–5.0)
Cholesterol, Total: 159 mg/dL (ref 100–199)
HDL: 37 mg/dL — ABNORMAL LOW (ref >39)
LDL Chol Calc (NIH): 93 mg/dL (ref 0–99)
Triglycerides: 163 mg/dL — ABNORMAL HIGH (ref 0–149)
VLDL Cholesterol Cal: 29 mg/dL (ref 5–40)

## 2023-12-06 ENCOUNTER — Ambulatory Visit (INDEPENDENT_AMBULATORY_CARE_PROVIDER_SITE_OTHER): Payer: Medicaid Other

## 2023-12-06 ENCOUNTER — Other Ambulatory Visit: Payer: Self-pay

## 2023-12-06 DIAGNOSIS — I255 Ischemic cardiomyopathy: Secondary | ICD-10-CM | POA: Diagnosis not present

## 2023-12-06 LAB — CUP PACEART REMOTE DEVICE CHECK
Battery Remaining Longevity: 99 mo
Battery Voltage: 3.01 V
Brady Statistic AP VP Percent: 0.09 %
Brady Statistic AP VS Percent: 56.71 %
Brady Statistic AS VP Percent: 0.02 %
Brady Statistic AS VS Percent: 43.19 %
Brady Statistic RA Percent Paced: 54.44 %
Brady Statistic RV Percent Paced: 0.11 %
Date Time Interrogation Session: 20250603033324
HighPow Impedance: 71 Ohm
Implantable Lead Connection Status: 753985
Implantable Lead Connection Status: 753985
Implantable Lead Implant Date: 20231004
Implantable Lead Implant Date: 20231004
Implantable Lead Location: 753859
Implantable Lead Location: 753860
Implantable Lead Model: 5076
Implantable Pulse Generator Implant Date: 20231004
Lead Channel Impedance Value: 380 Ohm
Lead Channel Impedance Value: 456 Ohm
Lead Channel Impedance Value: 456 Ohm
Lead Channel Pacing Threshold Amplitude: 0.375 V
Lead Channel Pacing Threshold Amplitude: 0.75 V
Lead Channel Pacing Threshold Pulse Width: 0.4 ms
Lead Channel Pacing Threshold Pulse Width: 0.4 ms
Lead Channel Sensing Intrinsic Amplitude: 31.625 mV
Lead Channel Sensing Intrinsic Amplitude: 31.625 mV
Lead Channel Sensing Intrinsic Amplitude: 4 mV
Lead Channel Sensing Intrinsic Amplitude: 4 mV
Lead Channel Setting Pacing Amplitude: 1.5 V
Lead Channel Setting Pacing Amplitude: 1.5 V
Lead Channel Setting Pacing Pulse Width: 0.4 ms
Lead Channel Setting Sensing Sensitivity: 0.3 mV
Zone Setting Status: 755011

## 2023-12-10 ENCOUNTER — Ambulatory Visit: Payer: Self-pay | Admitting: Cardiology

## 2023-12-14 ENCOUNTER — Ambulatory Visit: Admitting: Family Medicine

## 2024-01-16 ENCOUNTER — Telehealth: Payer: Self-pay | Admitting: Cardiology

## 2024-01-16 NOTE — Telephone Encounter (Signed)
 Called to confirm/remind patient of their appointment [facility] on [facility].   Appointment:   [x] Confirmed  [] Left mess   [] No answer/No voice mail  [] VM Full/unable to leave message  [] Phone not in service  Patient reminded to bring all medications and/or complete list.  Confirmed patient has transportation. Gave directions, instructed to utilize valet parking.

## 2024-01-17 ENCOUNTER — Ambulatory Visit (HOSPITAL_BASED_OUTPATIENT_CLINIC_OR_DEPARTMENT_OTHER): Admitting: Cardiology

## 2024-01-17 ENCOUNTER — Other Ambulatory Visit
Admission: RE | Admit: 2024-01-17 | Discharge: 2024-01-17 | Disposition: A | Discharge status: Home / Self Care | Source: Ambulatory Visit | Attending: Cardiology | Admitting: Cardiology

## 2024-01-17 ENCOUNTER — Encounter: Payer: Self-pay | Admitting: Cardiology

## 2024-01-17 ENCOUNTER — Other Ambulatory Visit: Payer: Self-pay

## 2024-01-17 VITALS — BP 145/91 | HR 80 | Wt 202.4 lb

## 2024-01-17 DIAGNOSIS — I5022 Chronic systolic (congestive) heart failure: Secondary | ICD-10-CM

## 2024-01-17 DIAGNOSIS — Z91148 Patient's other noncompliance with medication regimen for other reason: Secondary | ICD-10-CM

## 2024-01-17 DIAGNOSIS — Z8673 Personal history of transient ischemic attack (TIA), and cerebral infarction without residual deficits: Secondary | ICD-10-CM | POA: Insufficient documentation

## 2024-01-17 DIAGNOSIS — Z5986 Financial insecurity: Secondary | ICD-10-CM | POA: Insufficient documentation

## 2024-01-17 DIAGNOSIS — I493 Ventricular premature depolarization: Secondary | ICD-10-CM | POA: Insufficient documentation

## 2024-01-17 DIAGNOSIS — I255 Ischemic cardiomyopathy: Secondary | ICD-10-CM | POA: Diagnosis not present

## 2024-01-17 DIAGNOSIS — I10 Essential (primary) hypertension: Secondary | ICD-10-CM | POA: Diagnosis not present

## 2024-01-17 DIAGNOSIS — Z7984 Long term (current) use of oral hypoglycemic drugs: Secondary | ICD-10-CM | POA: Insufficient documentation

## 2024-01-17 DIAGNOSIS — Z8249 Family history of ischemic heart disease and other diseases of the circulatory system: Secondary | ICD-10-CM | POA: Diagnosis not present

## 2024-01-17 DIAGNOSIS — E119 Type 2 diabetes mellitus without complications: Secondary | ICD-10-CM | POA: Diagnosis not present

## 2024-01-17 DIAGNOSIS — G4733 Obstructive sleep apnea (adult) (pediatric): Secondary | ICD-10-CM | POA: Diagnosis not present

## 2024-01-17 DIAGNOSIS — Z79899 Other long term (current) drug therapy: Secondary | ICD-10-CM | POA: Diagnosis not present

## 2024-01-17 DIAGNOSIS — Z87891 Personal history of nicotine dependence: Secondary | ICD-10-CM | POA: Insufficient documentation

## 2024-01-17 DIAGNOSIS — Z833 Family history of diabetes mellitus: Secondary | ICD-10-CM | POA: Diagnosis not present

## 2024-01-17 DIAGNOSIS — Z955 Presence of coronary angioplasty implant and graft: Secondary | ICD-10-CM | POA: Insufficient documentation

## 2024-01-17 DIAGNOSIS — I110 Hypertensive heart disease with heart failure: Secondary | ICD-10-CM | POA: Diagnosis not present

## 2024-01-17 DIAGNOSIS — Z95810 Presence of automatic (implantable) cardiac defibrillator: Secondary | ICD-10-CM | POA: Diagnosis not present

## 2024-01-17 DIAGNOSIS — I252 Old myocardial infarction: Secondary | ICD-10-CM | POA: Insufficient documentation

## 2024-01-17 DIAGNOSIS — I2510 Atherosclerotic heart disease of native coronary artery without angina pectoris: Secondary | ICD-10-CM | POA: Diagnosis not present

## 2024-01-17 DIAGNOSIS — J449 Chronic obstructive pulmonary disease, unspecified: Secondary | ICD-10-CM | POA: Diagnosis not present

## 2024-01-17 LAB — BASIC METABOLIC PANEL WITH GFR
Anion gap: 9 (ref 5–15)
BUN: 9 mg/dL (ref 8–23)
CO2: 23 mmol/L (ref 22–32)
Calcium: 8.7 mg/dL — ABNORMAL LOW (ref 8.9–10.3)
Chloride: 109 mmol/L (ref 98–111)
Creatinine, Ser: 0.94 mg/dL (ref 0.61–1.24)
GFR, Estimated: >60 mL/min (ref >60)
Glucose, Bld: 138 mg/dL — ABNORMAL HIGH (ref 70–99)
Potassium: 3.3 mmol/L — ABNORMAL LOW (ref 3.5–5.1)
Sodium: 141 mmol/L (ref 135–145)

## 2024-01-17 LAB — BRAIN NATRIURETIC PEPTIDE: B Natriuretic Peptide: 381.3 pg/mL — ABNORMAL HIGH (ref 0.0–100.0)

## 2024-01-17 MED ORDER — LOSARTAN POTASSIUM 25 MG PO TABS
25.0000 mg | ORAL_TABLET | Freq: Every day | ORAL | 5 refills | Status: DC
  Filled 2024-01-17: qty 30, 30d supply, fill #0

## 2024-01-17 NOTE — Progress Notes (Signed)
 ADVANCED HF CLINIC NOTE  Referring Physician: [name], MD Primary Care: Patient, No Pcp Per Primary Cardiologist: Glen Cage, MD (last seen 05/24)  Chief Complaint: fatigue  HPI:  [name]. is a 68 y.o. male with CAD, DM2, PVCs, former smoker, previous CVA, HTN, OSA, COPD, VT s/p ICD and chronic systolic HF.  He has known history of CAD s/p  remote MI with previous LAD,diagonal and RCA stenting at [facility]. Cath 2016 showed patent LAD and diagonal stents and chronically occluded RCA and left to right collaterals.  He also has chronic systolic heart failure due to ischemic cardiomyopathy EF 25-30%  Cath 2/21 with 3v CAD with patent stents in the LAD and diagonal without significant restenosis, chronically occluded RCA stents with right to right and left-to-right collaterals and significant stenosis in the distal left circumflex supplying a relatively small OM 3 distribution.  Ejection fraction was 25 to 30%. Right heart catheterization showed normal filling pressures, mild pulmonary hypertension and normal cardiac output.  Medical therapy was recommended.  Had NSTEMI 4/21 Cath showed occluded distal left circumflex which was treated successfully with PCI and drug-eluting stent placement.     He was admitted 06/23 with chest pain and heart failure.  Echo EF 30-35%. Cath showed patent LAD and LCx stents with chronically occluded RCA with L-to-R collaterals.  Medical therapy recommended. He did not fill his prescriptions. He returned to the ED after he was stung by multiple yellow jackets and was found to be in VT at 175 bpm. He was cardioverted with 120 J. He went back into VT and was treated successfully with IV amiodarone .     Admitted 07/23 with VT in the setting of not taking amiodarone  as prescribed. He ultimately underwent ICD placement by Dr. Cindie in October.  He was seen 12/23. He was not taking ntresto regularly because it dragged him down. Stopped entresto  and added  eplerenone . He stopped eplerenone  because it made him feel anxious and he couldn't sleep. Losartan  as well as sertraline  for anxiety was started.   Sleep study 1/24 with mild OSA (AHI 9)   Echo 02/03/23 EF 30-35% mod MR RV ok.   Had cut back on lasix  and was not taking regularly. Seen on 02/25/23 in Pulmonary office with 6-7 pound weight gain and increased SOB. CXR showed pulmonary edema. BNP elevated, Lasix  restarted at 80 daily (was supposed to be on 40 daily but only taking a couple times/week). Also treated with prednisone  and inhalers for possible COPD flare  Admitted 09/27/23 with shortness of breath due to HF exacerbation due to medication noncompliance. Initial BP 163/116. Chest x-ray with mild vascular congestion. Initially needed oxygen. Cardiology consulted. IV diuresed with transition to oral diuretics.   Was in the ED 10/30/23 with SOB and dizziness. Dizziness is worse w/ movement. Unable to get MRA/MRI (concern for stroke) due to ICD so transported to [facility] 10/31/23. CT/MRI showed punctate acute infarct in the cerebellar vermis with moderate to severe chronic small vessel ischemic disease mild basilar artery stenosis. MRA otherwise unremarkable. Patient did not receive TNK. Echo 11/01/23: EF 30 to 35% left ventricle demonstrating regional wall motion abnormalities grade 1 diastolic dysfunction. CXR negative. Transferred to [facility] inpatient rehab on 11/04/23. He called a family member to come pick him up and left AMA saying inpatient rehab was like a prison.   He presents today for follow up. He has once again stopped taking all of his medications. Reports the last time he  took them was 2 months ago. Reports that he feels much better off meds.   ROS: All systems negative except what is listed in HPI, PMH and Problem List   Past Medical History:  Diagnosis Date   Anginal pain (HCC)    Arthritis    CAD (coronary artery disease)    a. s/p MI with LAD and Diag stenting @ [name];  b.  06/2015 Cath: LAD 85m/d ISR, 100 RCA (ISR) w/ L->R collats, otw mod nonobs dzs-->Med Rx; c. 08/2019 Cath: LM nl, LAD 10p/m ISR, D1 20, D2 100, RI min irregs, LCX 4m/d, OM1 100, OM2 50, RCA 100p, 70d. RPDA fills via collats from LAD. EF 25-35%-->Med Rx; d. 10/2019 NSTEMI/Cath: LCX now 100 (2.75x15 Resolute Onyx DES), otw stable compared to 08/2019.   Chronic combined systolic and diastolic CHF (congestive heart failure) (HCC)    a. 06/2015 Echo: EF 20-25%, Gr3 DD; b. 10/2019 Echo: EF 25-30%, glob HK, sev inf/infapical HK. Mod dil LA.   Dyspnea    Essential hypertension    Headache 12/23/2021   Hyperlipidemia    Hypokalemia    a. 06/2015 in setting of diuresis.   Ischemic cardiomyopathy    a. 2011 EF 45% ([name]);  b. 06/2015 Echo: EF 20-25%; c. 04/2019 Echo: EF 40-45%; d. 08/2019 LV gram: EF 25-35%; e. 10/2019 Echo: EF 25-30%.   PVC's (premature ventricular contractions)    a. 09/2019 Zio (3 days): Avg hr 70, 4 runs NSVT, 5 runs SVT, rare PACs, frequent PVCs w/ 11.8% burden.   Sleep apnea    Stroke [name])    Stroke/Right temporal lobe infarction [name])    a. 10/2019 MRI brain: 1cm acute ischemic nonhemorrhagic R temporal lobe infarct. Age-related cerebral atrophy w/ moderate chronic small vessel ischemic dzs.   Type 2 diabetes mellitus with hyperglycemia (HCC) 10/08/2019    Current Outpatient Medications  Medication Sig Dispense Refill   acetaminophen  (TYLENOL ) 325 MG tablet Take 2 tablets (650 mg total) by mouth every 6 (six) hours as needed for mild pain (pain score 1-3) (or Fever >/= 101). (Patient not taking: Reported on 01/17/2024)     carvedilol  (COREG ) 3.125 MG tablet Take 1 tablet (3.125 mg total) by mouth 2 (two) times daily with a meal. (Patient not taking: Reported on 01/17/2024) 120 tablet 0   clopidogrel  (PLAVIX ) 75 MG tablet Take 1 tablet (75 mg total) by mouth once daily. (Patient not taking: Reported on 01/17/2024) 90 tablet 1   dapagliflozin  propanediol (FARXIGA ) 10 MG TABS tablet Take 10  mg by mouth daily. (Patient not taking: Reported on 01/17/2024)     ezetimibe  (ZETIA ) 10 MG tablet Take 1 tablet (10 mg total) by mouth daily. (Patient not taking: Reported on 01/17/2024) 30 tablet 5   furosemide  (LASIX ) 40 MG tablet Take 40 mg by mouth daily. (Patient not taking: Reported on 01/17/2024)     meclizine  (ANTIVERT ) 12.5 MG tablet Take 1 tablet (12.5 mg total) by mouth 3 (three) times daily as needed for dizziness. (Patient not taking: Reported on 01/17/2024) 30 tablet 3   nitroGLYCERIN  (NITROSTAT ) 0.4 MG SL tablet Place 1 tablet (0.4 mg total) under the tongue every 5 (five) minutes as needed for chest pain. (Patient not taking: Reported on 01/17/2024) 90 tablet 3   sacubitril -valsartan  (ENTRESTO ) 24-26 MG Take 1 tablet by mouth 2 (two) times daily. (Patient not taking: Reported on 01/17/2024)     Tiotropium Bromide-Olodaterol (STIOLTO RESPIMAT ) 2.5-2.5 MCG/ACT AERS Inhale 2 puffs into the lungs daily. (Patient not taking:  Reported on 01/17/2024)     No current facility-administered medications for this visit.    Allergies  Allergen Reactions   Contrast Media [Iodinated Contrast Media] Shortness Of Breath   Empagliflozin  Other (See Comments)    Fatigue/weakness  Other Reaction(s): Not available  empagliflozin    Iohexol  Shortness Of Breath    Onset Date: [id number]  iohexol    Atorvastatin  Other (See Comments)    Myalgias    Atorvastatin  Calcium      Other Reaction(s): Not available  atorvastatin  calcium    Hydrocodone  Itching   Hydrocodone -Acetaminophen      Other Reaction(s): Not available   Morphine     Other Reaction(s): Not available   Morphine And Codeine Other (See Comments)    Lost control    Penicillins Other (See Comments)    Unknown reaction   Rosuvastatin  Other (See Comments)    Myalgias  Other Reaction(s): Not available  rosuvastatin       Social History   Socioeconomic History   Marital status: Widowed    Spouse name: Not on file   Number of  children: 2   Years of education: Not on file   Highest education level: GED or equivalent  Occupational History   Occupation: Statistician   Tobacco Use   Smoking status: Former    Current packs/day: 0.00    Types: Cigarettes    Quit date: 06/10/2011    Years since quitting: 12.6   Smokeless tobacco: Never  Vaping Use   Vaping status: Not on file  Substance and Sexual Activity   Alcohol use: No    Alcohol/week: 0.0 standard drinks of alcohol   Drug use: No   Sexual activity: Not Currently  Other Topics Concern   Not on file  Social History Narrative   Lives alone.   Social Drivers of Health   Financial Resource Strain: Medium Risk (09/29/2023)   Overall Financial Resource Strain (CARDIA)    Difficulty of Paying Living Expenses: Somewhat hard  Food Insecurity: No Food Insecurity (10/31/2023)   Hunger Vital Sign    Worried About Running Out of Food in the Last Year: Never true    Ran Out of Food in the Last Year: Never true  Transportation Needs: No Transportation Needs (10/31/2023)   PRAPARE - Administrator, Civil Service (Medical): No    Lack of Transportation (Non-Medical): No  Physical Activity: Inactive (04/30/2019)   Exercise Vital Sign    Days of Exercise per Week: 0 days    Minutes of Exercise per Session: 0 min  Stress: No Stress Concern Present (04/30/2019)   [employer] of Occupational Health - Occupational Stress Questionnaire    Feeling of Stress : Not at all  Social Connections: Moderately Isolated (10/31/2023)   Social Connection and Isolation Panel    Frequency of Communication with Friends and Family: More than three times a week    Frequency of Social Gatherings with Friends and Family: More than three times a week    Attends Religious Services: Never    Database administrator or Organizations: Yes    Attends Banker Meetings: Never    Marital Status: Divorced  Catering manager Violence: Not At Risk (10/31/2023)    Humiliation, Afraid, Rape, and Kick questionnaire    Fear of Current or Ex-Partner: No    Emotionally Abused: No    Physically Abused: No    Sexually Abused: No      Family History  Problem Relation Age of Onset  Coronary artery disease Mother    Diabetes Mother    Coronary artery disease Father     Vitals:   01/17/24 1116  BP: (!) 145/91  Pulse: 80  SpO2: 95%  Weight: 202 lb 6 oz (91.8 kg)   Wt Readings from Last 3 Encounters:  01/17/24 202 lb 6 oz (91.8 kg)  12/01/23 202 lb (91.6 kg)  11/30/23 202 lb (91.6 kg)   Lab Results  Component Value Date   CREATININE 0.94 11/30/2023   CREATININE 1.23 11/03/2023   CREATININE 1.27 (H) 11/02/2023    PHYSICAL EXAM: Vitals:   01/17/24 1116  BP: (!) 145/91  Pulse: 80  SpO2: 95%   GENERAL: NAD Lungs- CTA CARDIAC:  JVP: 7 cm          Normal rate with regular rhythm. no murmur.  Pulses 2+. no edema.  ABDOMEN: Soft, non-tender, non-distended.  EXTREMITIES: Warm and well perfused.  NEUROLOGIC: No obvious FND    EKG: not done  Medtronic device interrogated: Optivol fluid < threshold, impedence below reference,  no AF/ VT events   ASSESSMENT & PLAN:  1.  Chronic systolic heart failure due to iCM - Echo 6/23 EF 30-35%.   - Echo 02/03/23 EF 30-35% mod MR RV ok.  - Echo 11/01/23: EF 30 to 35% left ventricle demonstrating regional wall motion abnormalities grade 1 diastolic dysfunction. - s/p MDT ICD - NYHA class II - euvolemic - He has stopped taking all medications. Patient was recently admitted due to med non-compliance and hypertensive urgency. After lengthy discussion today he is agreeable to starting losartan  25mg  daily. Unlikely to obtain labs.   2. CAD  - s/p remote MI. S/p PCI LAD, LCx and RCA - Cath 6/23: patent LAD and left circumflex stents with chronically occluded right coronary artery with left-to-right collaterals.  Medical therapy was recommended. - No s/s angina - see above; not taking any medications  now.   3  HTN:  - BP 140/88 - BMET 11/03/23 reviewed: sodium 138, potassium 3.8, creatinine 1.23 & GFR >60   4. H/o ventricular tachycardia: - Status post ICD placement.   - saw EP ([facility]) 01/25 - Mg today  5. H/o PVCs - Zio 3/21: Avg hr 70, 4 runs NSVT, 5 runs SVT, rare PACs, frequent PVCs w/ 11.8% burden.  6. Mild OSA - Sleep study 1/24 with mild OSA (AHI 9)  - saw pulmonology Herlene) 03/25  I spent 35 minutes caring for this patient today including face to face time, ordering and reviewing labs, extensively counseling on medication compliance (patient has now stopped taking all meds again; has had prior admissions due to non-compliance), seeing the patient, documenting in the record, and arranging follow ups.   Ria Commander, DO  11:32 AM 11/29/23

## 2024-01-17 NOTE — Patient Instructions (Addendum)
 Medication Changes:  DISCONTINUE ENTRESTO   START LOSARTAN  25 MG ONCE DAILY   Lab Work:  Go DOWN to LOWER LEVEL (LL) to have your blood work completed inside of [employer] office.  We will only call you if the results are abnormal or if the provider would like to make medication changes.    Follow-Up in: 3 MONTHS WITH DR. GARDENIA.  Our Doctors' schedules are NOT open yet for 3 months. We will place you on our recall list. Once they are available, we will call you to schedule your follow up appointment.   At the [facility], you and your health needs are our priority. We have a designated team specialized in the treatment of Heart Failure. This Care Team includes your primary Heart Failure Specialized Cardiologist (physician), Advanced Practice Providers (APPs- Physician Assistants and Nurse Practitioners), and Pharmacist who all work together to provide you with the care you need, when you need it.   You may see any of the following providers on your designated Care Team at your next follow up:  Dr. Toribio Fuel Dr. Ezra Shuck Dr. Ria GARDENIA Dr. Odis Brownie Ellouise Class, FNP Jaun Bash, RPH-CPP  Please be sure to bring in all your medications bottles to every appointment.   Need to Contact Us :  If you have any questions or concerns before your next appointment please send us  a message through [place] or call our office at [phone].    TO LEAVE A MESSAGE FOR THE NURSE SELECT OPTION 2, PLEASE LEAVE A MESSAGE INCLUDING: YOUR NAME DATE OF BIRTH CALL BACK NUMBER REASON FOR CALL**this is important as we prioritize the call backs  YOU WILL RECEIVE A CALL BACK THE SAME DAY AS LONG AS YOU CALL BEFORE 4:00 PM

## 2024-01-30 ENCOUNTER — Ambulatory Visit: Attending: Neurology

## 2024-01-31 NOTE — Progress Notes (Signed)
 This encounter was created in error - please disregard.  This encounter was created in error - please disregard.
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
## 2024-02-02 ENCOUNTER — Encounter: Payer: Self-pay | Admitting: Cardiology

## 2024-02-02 ENCOUNTER — Ambulatory Visit: Attending: Cardiology | Admitting: Cardiology

## 2024-02-02 VITALS — BP 142/82 | HR 63 | Ht 66.0 in | Wt 202.8 lb

## 2024-02-02 DIAGNOSIS — Z95810 Presence of automatic (implantable) cardiac defibrillator: Secondary | ICD-10-CM | POA: Insufficient documentation

## 2024-02-02 DIAGNOSIS — I5022 Chronic systolic (congestive) heart failure: Secondary | ICD-10-CM | POA: Diagnosis not present

## 2024-02-02 DIAGNOSIS — G4733 Obstructive sleep apnea (adult) (pediatric): Secondary | ICD-10-CM | POA: Diagnosis present

## 2024-02-02 DIAGNOSIS — Z91148 Patient's other noncompliance with medication regimen for other reason: Secondary | ICD-10-CM | POA: Diagnosis present

## 2024-02-02 DIAGNOSIS — I255 Ischemic cardiomyopathy: Secondary | ICD-10-CM | POA: Diagnosis present

## 2024-02-02 DIAGNOSIS — E782 Mixed hyperlipidemia: Secondary | ICD-10-CM | POA: Diagnosis present

## 2024-02-02 DIAGNOSIS — I2510 Atherosclerotic heart disease of native coronary artery without angina pectoris: Secondary | ICD-10-CM | POA: Insufficient documentation

## 2024-02-02 DIAGNOSIS — I472 Ventricular tachycardia, unspecified: Secondary | ICD-10-CM | POA: Diagnosis not present

## 2024-02-02 DIAGNOSIS — Z79899 Other long term (current) drug therapy: Secondary | ICD-10-CM | POA: Diagnosis present

## 2024-02-02 DIAGNOSIS — Z8673 Personal history of transient ischemic attack (TIA), and cerebral infarction without residual deficits: Secondary | ICD-10-CM | POA: Insufficient documentation

## 2024-02-02 DIAGNOSIS — I10 Essential (primary) hypertension: Secondary | ICD-10-CM | POA: Diagnosis present

## 2024-02-02 DIAGNOSIS — E876 Hypokalemia: Secondary | ICD-10-CM | POA: Insufficient documentation

## 2024-02-02 NOTE — Progress Notes (Signed)
 Remote ICD transmission.

## 2024-02-02 NOTE — Patient Instructions (Signed)
 Medication Instructions:  Your physician recommends the following medication changes.  START TAKING: Plavix  75 mg daily Aspirin  81 mg daily Losartan  25 mg daily  *If you need a refill on your cardiac medications before your next appointment, please call your pharmacy*  Lab Work: Your provider would like for you to have following labs drawn today BMP.   If you have labs (blood work) drawn today and your tests are completely normal, you will receive your results only by: MyChart Message (if you have MyChart) OR A paper copy in the mail If you have any lab test that is abnormal or we need to change your treatment, we will call you to review the results.  Testing/Procedures: No test ordered today   Follow-Up: At [facility], you and your health needs are our priority.  As part of our continuing mission to provide you with exceptional heart care, our providers are all part of one team.  This team includes your primary Cardiologist (physician) and Advanced Practice Providers or APPs (Physician Assistants and Nurse Practitioners) who all work together to provide you with the care you need, when you need it.  Your next appointment:   3 month(s)  Provider:   [name], MD or Tylene Lunch, NP    We recommend signing up for the patient portal called MyChart.  Sign up information is provided on this After Visit Summary.  MyChart is used to connect with patients for Virtual Visits (Telemedicine).  Patients are able to view lab/test results, encounter notes, upcoming appointments, etc.  Non-urgent messages can be sent to your provider as well.   To learn more about what you can do with MyChart, go to [email or web address].

## 2024-02-03 ENCOUNTER — Ambulatory Visit

## 2024-02-04 LAB — BASIC METABOLIC PANEL WITH GFR
BUN/Creatinine Ratio: 13 (ref 10–24)
BUN: 13 mg/dL (ref 8–27)
CO2: 20 mmol/L (ref 20–29)
Calcium: 9 mg/dL (ref 8.6–10.2)
Chloride: 105 mmol/L (ref 96–106)
Creatinine, Ser: 0.98 mg/dL (ref 0.76–1.27)
Glucose: 157 mg/dL — ABNORMAL HIGH (ref 70–99)
Potassium: 3.4 mmol/L — ABNORMAL LOW (ref 3.5–5.2)
Sodium: 143 mmol/L (ref 134–144)
eGFR: 85 mL/min/1.73 (ref >59)

## 2024-02-06 ENCOUNTER — Ambulatory Visit

## 2024-02-07 ENCOUNTER — Ambulatory Visit: Payer: Self-pay | Admitting: Cardiology

## 2024-02-07 DIAGNOSIS — Z79899 Other long term (current) drug therapy: Secondary | ICD-10-CM

## 2024-02-07 NOTE — Progress Notes (Signed)
 Potassium remains low at 3.4.  He would benefit from having potassium level closer to 4.  Recommend potassium chloride  40 mEq x 2 doses and recheck a BMP in 2 weeks with recent initiation of losartan  25 mg daily.

## 2024-02-09 ENCOUNTER — Other Ambulatory Visit: Payer: Self-pay

## 2024-02-09 MED ORDER — POTASSIUM CHLORIDE CRYS ER 20 MEQ PO TBCR
40.0000 meq | EXTENDED_RELEASE_TABLET | Freq: Every day | ORAL | 0 refills | Status: DC
  Filled 2024-02-09: qty 4, 2d supply, fill #0

## 2024-02-10 ENCOUNTER — Encounter

## 2024-02-13 ENCOUNTER — Encounter

## 2024-02-15 ENCOUNTER — Encounter

## 2024-02-20 ENCOUNTER — Encounter

## 2024-02-22 ENCOUNTER — Encounter

## 2024-02-27 ENCOUNTER — Encounter

## 2024-02-28 ENCOUNTER — Other Ambulatory Visit: Payer: Self-pay

## 2024-02-28 ENCOUNTER — Emergency Department
Admission: EM | Admit: 2024-02-28 | Discharge: 2024-02-28 | Disposition: A | Discharge status: Home / Self Care | Attending: Emergency Medicine | Admitting: Emergency Medicine

## 2024-02-28 DIAGNOSIS — I2510 Atherosclerotic heart disease of native coronary artery without angina pectoris: Secondary | ICD-10-CM | POA: Diagnosis not present

## 2024-02-28 DIAGNOSIS — U071 COVID-19: Secondary | ICD-10-CM | POA: Diagnosis not present

## 2024-02-28 DIAGNOSIS — I10 Essential (primary) hypertension: Secondary | ICD-10-CM | POA: Insufficient documentation

## 2024-02-28 DIAGNOSIS — R519 Headache, unspecified: Secondary | ICD-10-CM | POA: Diagnosis present

## 2024-02-28 DIAGNOSIS — E119 Type 2 diabetes mellitus without complications: Secondary | ICD-10-CM | POA: Diagnosis not present

## 2024-02-28 LAB — RESP PANEL BY RT-PCR (RSV, FLU A&B, COVID)  RVPGX2
Influenza A by PCR: NEGATIVE
Influenza B by PCR: NEGATIVE
Resp Syncytial Virus by PCR: NEGATIVE
SARS Coronavirus 2 by RT PCR: POSITIVE — AB

## 2024-02-28 MED ORDER — IBUPROFEN 800 MG PO TABS
800.0000 mg | ORAL_TABLET | Freq: Once | ORAL | Status: AC
  Administered 2024-02-28: 800 mg via ORAL
  Filled 2024-02-28: qty 1

## 2024-02-28 NOTE — Discharge Instructions (Addendum)
 You tested positive for COVID today.  This is a viral illness which will resolve on its own with time.  You do not need an antibiotic.  You can take over-the-counter cold medicine like DayQuil or TheraFlu as needed to manage your symptoms.  If you are taking combination cold medicine keep in mind that this often contains Tylenol  so if you need additional medication for body aches or fever control please take Motrin  or ibuprofen .  Your symptoms should resolve with time, if you have had symptoms for greater than 10 days please be evaluated by another healthcare provider as at this point it may have developed into a bacterial infection which requires a different treatment.  Return to the emergency department with worsening symptoms.

## 2024-02-28 NOTE — ED Triage Notes (Signed)
 Pt comes in via pov with complaining of a headache that started late yesterday evening. Pt states that he feels overall bad with congestion and new cough.  Pt complains of head pain 8/10.

## 2024-02-28 NOTE — ED Notes (Signed)
 Full rainbow sent to lab if needed.

## 2024-02-28 NOTE — ED Provider Notes (Signed)
 [facility] Provider Note    Event Date/Time   First MD Initiated Contact with Patient 02/28/24 1850     (approximate)   History   Headache and Nasal Congestion   HPI  [name]. is a 68 y.o. male with PMH of CAD, diabetes, hypertension, stroke presents for evaluation of headache, nasal congestion and cough.  Patient states that his symptoms began yesterday.  He reports he has not taken any medication yet.  Patient thinks he may have had a fever at home but did not quantify with thermometer.  He does report chills.      Physical Exam   Triage Vital Signs: ED Triage Vitals [02/28/24 1825]  Encounter Vitals Group     BP      Girls Systolic BP Percentile      Girls Diastolic BP Percentile      Boys Systolic BP Percentile      Boys Diastolic BP Percentile      Pulse      Resp      Temp 99.2 F (37.3 C)     Temp src      SpO2      Weight 211 lb (95.7 kg)     Height 5' 6 (1.676 m)     Head Circumference      Peak Flow      Pain Score 8     Pain Loc      Pain Education      Exclude from Growth Chart     Most recent vital signs: Vitals:   02/28/24 1825 02/28/24 2020  BP:  (!) 125/52  Pulse:  79  Resp:  17  Temp: 99.2 F (37.3 C) 99.2 F (37.3 C)  SpO2:  95%   General: Awake, no distress.  CV:  Good peripheral perfusion.  RRR. Resp:  Normal effort.  CTAB. Abd:  No distention.  Other:  PERRL, EOM intact, no ataxia, no pronator drift, no focal neurodeficits.   ED Results / Procedures / Treatments   Labs (all labs ordered are listed, but only abnormal results are displayed) Labs Reviewed  RESP PANEL BY RT-PCR (RSV, FLU A&B, COVID)  RVPGX2 - Abnormal; Notable for the following components:      Result Value   SARS Coronavirus 2 by RT PCR POSITIVE (*)    All other components within normal limits    PROCEDURES:  Critical Care performed: No  Procedures   MEDICATIONS ORDERED IN ED: Medications  ibuprofen  (ADVIL )  tablet 800 mg (800 mg Oral Given 02/28/24 2017)     IMPRESSION / MDM / ASSESSMENT AND PLAN / ED COURSE  I reviewed the triage vital signs and the nursing notes.                             68 year old male presents for evaluation of URI symptoms.  Vital signs are stable. Patient NAD on exam.   Differential diagnosis includes, but is not limited to, flu, covid, rsv, other viral syndrome, bronchitis.  Patient's presentation is most consistent with acute, uncomplicated illness.  Resp panel was positive for COVID. Patient was advised on symptomatic management using OTC cold medicines. He was given ibuprofen  for his headache while in the ED. Patient voiced understanding, all questions were answered and he was stable at discharge.     FINAL CLINICAL IMPRESSION(S) / ED DIAGNOSES   Final diagnoses:  COVID  Rx / DC Orders   ED Discharge Orders     None        Note:  This document was prepared using Dragon voice recognition software and may include unintentional dictation errors.   Cleaster Tinnie LABOR, PA-C 02/28/24 2046    Willo Dunnings, MD 02/29/24 TYRA

## 2024-02-29 ENCOUNTER — Encounter

## 2024-03-06 ENCOUNTER — Ambulatory Visit (INDEPENDENT_AMBULATORY_CARE_PROVIDER_SITE_OTHER): Payer: Medicaid Other

## 2024-03-06 DIAGNOSIS — I472 Ventricular tachycardia, unspecified: Secondary | ICD-10-CM | POA: Diagnosis not present

## 2024-03-08 LAB — CUP PACEART REMOTE DEVICE CHECK
Battery Remaining Longevity: 95 mo
Battery Voltage: 3.01 V
Brady Statistic AP VP Percent: 0.06 %
Brady Statistic AP VS Percent: 45.61 %
Brady Statistic AS VP Percent: 0.02 %
Brady Statistic AS VS Percent: 54.31 %
Brady Statistic RA Percent Paced: 44.01 %
Brady Statistic RV Percent Paced: 0.08 %
Date Time Interrogation Session: 20250902022603
HighPow Impedance: 70 Ohm
Implantable Lead Connection Status: 753985
Implantable Lead Connection Status: 753985
Implantable Lead Implant Date: 20231004
Implantable Lead Implant Date: 20231004
Implantable Lead Location: 753859
Implantable Lead Location: 753860
Implantable Lead Model: 5076
Implantable Pulse Generator Implant Date: 20231004
Lead Channel Impedance Value: 342 Ohm
Lead Channel Impedance Value: 399 Ohm
Lead Channel Impedance Value: 399 Ohm
Lead Channel Pacing Threshold Amplitude: 0.375 V
Lead Channel Pacing Threshold Amplitude: 1 V
Lead Channel Pacing Threshold Pulse Width: 0.4 ms
Lead Channel Pacing Threshold Pulse Width: 0.4 ms
Lead Channel Sensing Intrinsic Amplitude: 2.125 mV
Lead Channel Sensing Intrinsic Amplitude: 2.125 mV
Lead Channel Sensing Intrinsic Amplitude: 28.75 mV
Lead Channel Sensing Intrinsic Amplitude: 28.75 mV
Lead Channel Setting Pacing Amplitude: 1.5 V
Lead Channel Setting Pacing Amplitude: 1.5 V
Lead Channel Setting Pacing Pulse Width: 0.4 ms
Lead Channel Setting Sensing Sensitivity: 0.3 mV
Zone Setting Status: 755011

## 2024-03-10 ENCOUNTER — Ambulatory Visit: Payer: Self-pay | Admitting: Cardiology

## 2024-03-13 NOTE — Progress Notes (Signed)
Remote ICD Transmission.

## 2024-03-30 ENCOUNTER — Other Ambulatory Visit: Payer: Self-pay | Admitting: Pulmonary Disease

## 2024-03-30 ENCOUNTER — Other Ambulatory Visit: Payer: Self-pay

## 2024-03-30 ENCOUNTER — Other Ambulatory Visit: Payer: Self-pay | Admitting: Family

## 2024-03-30 MED FILL — Albuterol Sulfate Inhal Aero 108 MCG/ACT (90MCG Base Equiv): RESPIRATORY_TRACT | 75 days supply | Qty: 54 | Fill #0 | Status: CN

## 2024-04-02 ENCOUNTER — Other Ambulatory Visit: Payer: Self-pay | Admitting: Cardiology

## 2024-04-02 ENCOUNTER — Other Ambulatory Visit: Payer: Self-pay

## 2024-04-04 ENCOUNTER — Other Ambulatory Visit: Payer: Self-pay

## 2024-04-10 ENCOUNTER — Other Ambulatory Visit: Payer: Self-pay

## 2024-04-12 ENCOUNTER — Encounter: Admitting: Family Medicine

## 2024-04-12 ENCOUNTER — Encounter: Payer: Self-pay | Admitting: Family Medicine

## 2024-04-12 NOTE — Progress Notes (Signed)
 Pt left before the visit started. This encounter was created in error - please disregard.

## 2024-04-18 ENCOUNTER — Ambulatory Visit: Payer: Medicaid Other
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
## 2024-05-04 ENCOUNTER — Ambulatory Visit: Attending: Medical | Admitting: Medical

## 2024-05-04 ENCOUNTER — Encounter: Payer: Self-pay | Admitting: Medical

## 2024-05-04 ENCOUNTER — Other Ambulatory Visit: Payer: Self-pay

## 2024-05-04 VITALS — BP 150/90 | HR 72 | Ht 66.0 in | Wt 201.8 lb

## 2024-05-04 DIAGNOSIS — Z79899 Other long term (current) drug therapy: Secondary | ICD-10-CM | POA: Insufficient documentation

## 2024-05-04 DIAGNOSIS — I5022 Chronic systolic (congestive) heart failure: Secondary | ICD-10-CM | POA: Insufficient documentation

## 2024-05-04 DIAGNOSIS — I5023 Acute on chronic systolic (congestive) heart failure: Secondary | ICD-10-CM

## 2024-05-04 DIAGNOSIS — E782 Mixed hyperlipidemia: Secondary | ICD-10-CM | POA: Insufficient documentation

## 2024-05-04 DIAGNOSIS — G4733 Obstructive sleep apnea (adult) (pediatric): Secondary | ICD-10-CM | POA: Insufficient documentation

## 2024-05-04 DIAGNOSIS — I255 Ischemic cardiomyopathy: Secondary | ICD-10-CM | POA: Diagnosis present

## 2024-05-04 DIAGNOSIS — Z95810 Presence of automatic (implantable) cardiac defibrillator: Secondary | ICD-10-CM | POA: Diagnosis present

## 2024-05-04 DIAGNOSIS — I2510 Atherosclerotic heart disease of native coronary artery without angina pectoris: Secondary | ICD-10-CM | POA: Insufficient documentation

## 2024-05-04 DIAGNOSIS — I10 Essential (primary) hypertension: Secondary | ICD-10-CM | POA: Diagnosis present

## 2024-05-04 DIAGNOSIS — I472 Ventricular tachycardia, unspecified: Secondary | ICD-10-CM | POA: Insufficient documentation

## 2024-05-04 DIAGNOSIS — R0609 Other forms of dyspnea: Secondary | ICD-10-CM | POA: Diagnosis present

## 2024-05-04 MED ORDER — ASPIRIN 81 MG PO TBEC
81.0000 mg | DELAYED_RELEASE_TABLET | Freq: Every day | ORAL | Status: AC
Start: 2024-05-04 — End: ?

## 2024-05-04 MED ORDER — EZETIMIBE 10 MG PO TABS
10.0000 mg | ORAL_TABLET | Freq: Every day | ORAL | 3 refills | Status: AC
  Filled 2024-05-04: qty 90, 90d supply, fill #0

## 2024-05-04 MED ORDER — POTASSIUM CHLORIDE CRYS ER 10 MEQ PO TBCR
10.0000 meq | EXTENDED_RELEASE_TABLET | Freq: Every day | ORAL | 3 refills | Status: DC
  Filled 2024-05-04: qty 90, 90d supply, fill #0

## 2024-05-04 MED ORDER — METOPROLOL SUCCINATE ER 25 MG PO TB24
25.0000 mg | ORAL_TABLET | Freq: Every day | ORAL | 3 refills | Status: AC
  Filled 2024-05-04: qty 90, 90d supply, fill #0

## 2024-05-04 MED ORDER — LOSARTAN POTASSIUM 25 MG PO TABS
25.0000 mg | ORAL_TABLET | Freq: Every day | ORAL | 3 refills | Status: DC
  Filled 2024-05-04: qty 90, 90d supply, fill #0

## 2024-05-04 MED ORDER — CLOPIDOGREL BISULFATE 75 MG PO TABS
75.0000 mg | ORAL_TABLET | Freq: Every day | ORAL | 3 refills | Status: AC
  Filled 2024-05-04: qty 90, 90d supply, fill #0

## 2024-05-04 MED ORDER — FUROSEMIDE 20 MG PO TABS
20.0000 mg | ORAL_TABLET | Freq: Every day | ORAL | 3 refills | Status: DC
  Filled 2024-05-04: qty 90, 90d supply, fill #0

## 2024-05-04 MED FILL — Albuterol Sulfate Inhal Aero 108 MCG/ACT (90MCG Base Equiv): RESPIRATORY_TRACT | 75 days supply | Qty: 54 | Fill #0 | Status: AC

## 2024-05-04 NOTE — Patient Instructions (Signed)
 Medication Instructions:  Your physician recommends the following medication changes.  START TAKING: Aspirin  81 mg by mouth daily Zetia  10 mg by mouth daily  Lasix  20 mg by mouth daily  Potassium 10 meq by mouth daily  Metoprolol  25 mg by mouth daily   RESTART: Plavix  75 mg by mouth daily  Losartan  25 mg by mouth daily   *If you need a refill on your cardiac medications before your next appointment, please call your pharmacy*  Lab Work: Your provider would like for you to have following labs drawn today BMP, BNP, CBC, TSH.    Your provider would like for you to return in 1 week to have the following labs drawn: BMP.   Please go to [facility] [address] ([facility] [address], [place] [address] You do not need an appointment.  They are open from 8 am- 4:30 pm.  Lunch from 1:00 pm- 2:00 pm You will not need to be fasting.    Testing/Procedures: Your physician has requested that you have an Limited echocardiogram. Echocardiography is a painless test that uses sound waves to create images of your heart. It provides your doctor with information about the size and shape of your heart and how well your heart's chambers and valves are working.   You may receive an ultrasound enhancing agent through an IV if needed to better visualize your heart during the echo. This procedure takes approximately one hour.  There are no restrictions for this procedure.  This will take place at [address]) [address], [place] [address]  Please note: We ask at that you not bring children with you during ultrasound (echo/ vascular) testing. Due to room size and safety concerns, children are not allowed in the ultrasound rooms during exams. Our front office staff cannot provide observation of children in our lobby area while testing is being conducted. An adult accompanying a patient to their appointment will only be allowed in the ultrasound room at the  discretion of the ultrasound technician under special circumstances. We apologize for any inconvenience.   Follow-Up: At [facility], you and your health needs are our priority.  As part of our continuing mission to provide you with exceptional heart care, our providers are all part of one team.  This team includes your primary Cardiologist (physician) and Advanced Practice Providers or APPs (Physician Assistants and Nurse Practitioners) who all work together to provide you with the care you need, when you need it.  Your next appointment:   1 month(s)  Provider:   [name], MD or Cadence Franchester, PA-C

## 2024-05-07 ENCOUNTER — Other Ambulatory Visit: Payer: Self-pay

## 2024-05-07 ENCOUNTER — Ambulatory Visit: Payer: Self-pay | Admitting: Medical

## 2024-05-07 DIAGNOSIS — I5022 Chronic systolic (congestive) heart failure: Secondary | ICD-10-CM

## 2024-05-07 LAB — CBC
Hematocrit: 51 % (ref 37.5–51.0)
Hemoglobin: 17.2 g/dL (ref 13.0–17.7)
MCH: 30.8 pg (ref 26.6–33.0)
MCHC: 33.7 g/dL (ref 31.5–35.7)
MCV: 91 fL (ref 79–97)
Platelets: 231 x10E3/uL (ref 150–450)
RBC: 5.59 x10E6/uL (ref 4.14–5.80)
RDW: 13.4 % (ref 11.6–15.4)
WBC: 8.2 x10E3/uL (ref 3.4–10.8)

## 2024-05-07 LAB — BASIC METABOLIC PANEL WITH GFR
BUN/Creatinine Ratio: 19 (ref 10–24)
BUN: 18 mg/dL (ref 8–27)
CO2: 18 mmol/L — ABNORMAL LOW (ref 20–29)
Calcium: 9.6 mg/dL (ref 8.6–10.2)
Chloride: 102 mmol/L (ref 96–106)
Creatinine, Ser: 0.97 mg/dL (ref 0.76–1.27)
Glucose: 153 mg/dL — ABNORMAL HIGH (ref 70–99)
Potassium: 4 mmol/L (ref 3.5–5.2)
Sodium: 141 mmol/L (ref 134–144)
eGFR: 86 mL/min/1.73 (ref >59)

## 2024-05-07 LAB — BRAIN NATRIURETIC PEPTIDE: BNP: 127 pg/mL — AB (ref 0.0–100.0)

## 2024-05-07 LAB — TSH: TSH: 1.26 u[IU]/mL (ref 0.450–4.500)

## 2024-05-10 LAB — BASIC METABOLIC PANEL WITH GFR
BUN/Creatinine Ratio: 18 (ref 10–24)
BUN: 17 mg/dL (ref 8–27)
CO2: 21 mmol/L (ref 20–29)
Calcium: 9.4 mg/dL (ref 8.6–10.2)
Chloride: 103 mmol/L (ref 96–106)
Creatinine, Ser: 0.92 mg/dL (ref 0.76–1.27)
Glucose: 114 mg/dL — ABNORMAL HIGH (ref 70–99)
Potassium: 3.8 mmol/L (ref 3.5–5.2)
Sodium: 140 mmol/L (ref 134–144)
eGFR: 91 mL/min/1.73 (ref >59)

## 2024-05-28 ENCOUNTER — Other Ambulatory Visit: Payer: Self-pay

## 2024-05-28 ENCOUNTER — Encounter: Payer: Self-pay | Admitting: Nurse Practitioner

## 2024-05-28 ENCOUNTER — Ambulatory Visit: Admitting: Nurse Practitioner

## 2024-05-28 VITALS — BP 120/80 | HR 56 | Temp 97.7°F | Ht 66.0 in | Wt 204.4 lb

## 2024-05-28 DIAGNOSIS — J449 Chronic obstructive pulmonary disease, unspecified: Secondary | ICD-10-CM

## 2024-05-28 DIAGNOSIS — J3089 Other allergic rhinitis: Secondary | ICD-10-CM | POA: Diagnosis not present

## 2024-05-28 DIAGNOSIS — J309 Allergic rhinitis, unspecified: Secondary | ICD-10-CM | POA: Insufficient documentation

## 2024-05-28 DIAGNOSIS — J441 Chronic obstructive pulmonary disease with (acute) exacerbation: Secondary | ICD-10-CM

## 2024-05-28 DIAGNOSIS — J439 Emphysema, unspecified: Secondary | ICD-10-CM

## 2024-05-28 MED ORDER — TRELEGY ELLIPTA 100-62.5-25 MCG/ACT IN AEPB
1.0000 | INHALATION_SPRAY | Freq: Every day | RESPIRATORY_TRACT | 11 refills | Status: DC
  Filled 2024-05-28: qty 60, 30d supply, fill #0

## 2024-05-28 MED ORDER — FLUTICASONE PROPIONATE 50 MCG/ACT NA SUSP
2.0000 | Freq: Every day | NASAL | 2 refills | Status: AC
  Filled 2024-05-28: qty 16, 30d supply, fill #0

## 2024-05-28 MED ORDER — PREDNISONE 20 MG PO TABS
40.0000 mg | ORAL_TABLET | Freq: Every day | ORAL | 0 refills | Status: AC
  Filled 2024-05-28: qty 10, 5d supply, fill #0

## 2024-05-28 MED ORDER — ALBUTEROL SULFATE (2.5 MG/3ML) 0.083% IN NEBU
2.5000 mg | INHALATION_SOLUTION | Freq: Four times a day (QID) | RESPIRATORY_TRACT | 5 refills | Status: AC | PRN
  Filled 2024-05-28: qty 75, 7d supply, fill #0

## 2024-05-28 NOTE — Progress Notes (Signed)
 @Patient  ID: [name]., male    DOB: Jan 21, 1956, 68 y.o.   MRN: [id number]  Chief Complaint  Patient presents with   COPD    SOB/DOE follow up, on 2L at bedtime  Breathing has been bothering him since his last visit, gets out of breath easily, and has noticed this has progressed in the past week or so. Has been using his albuterol  inhaler frequently which doesn't seem to help his symptoms much. Not currently using any oxygen at home and reports he never used it even after they were delivered -- didn't want to get dependent on it, but its not working out too good.    Referring provider: Franchester Mikey VEAR, PA-C  HPI: 68 year old male, former smoker followed for COPD and OSA on CPAP.  He is a patient of Dr. Tamea and last seen in office 09/27/2023.  Past medical history significant for CAD, ischemic cardiomyopathy status post ICD, CHF, hypertension, DM 2, HLD, obesity.  TEST/EVENTS:  04/15/2022 PFT: FVC 73, FEV1 72, ratio 78, TLC 69, DLCOunc 45  07/19/2022 NPSG: AHI 14.5, SpO2 low of 86% 02/03/2023 echo: EF 30-35%. Global hypokinesis. RV size and function nl. Mildly elevated PASP. LA severely dilated. Moderate MR.   09/27/2023: OV with Dr. Tamea. COPD and CHF. Presents with SOB. Significant SOB, worsened last night. Used albuterol  which didn't alleviate symptoms. No CP. Ran out of his Stiolto 2 days ago. Does not monitor O2 at home. Has a cough with some phlegm; cream colored. Had cold chills last night and yesterday evening. No fever. Treated with duoneb and advised to go to the ED. Advised to resume Stiolto.   05/28/2024: Today - follow up Discussed the use of AI scribe software for clinical note transcription with the patient, who gave verbal consent to proceed.  History of Present Illness [name]. is a 68 year old male with chronic obstructive pulmonary disease (COPD) who presents with worsening breathing over the last week.  He has experienced worsening breathing  over the past week without any recent illness or fever. He has a cough primarily to clear his throat, without producing any colored sputum, and notes a sensation of mucus in his throat causing a 'little bit of a wheeze.' No swelling in his legs is noted. No orthopnea, PND, palpitations, CP.   He has been using his albuterol  inhaler frequently, up to four times a day, including three times in the morning. He has not used his Stiolto inhaler for some time and is currently not on a daily maintenance inhaler.  He experiences some sinus congestion, with a 'little pressure' in his sinuses, and occasionally uses Afrin nasal spray two to three times a week at night. No sinus tenderness, sore throat, ear pain, colored drainage. He denies checking his oxygen levels at home.    Allergies  Allergen Reactions   Contrast Media [Iodinated Contrast Media] Shortness Of Breath   Empagliflozin  Other (See Comments)    Fatigue/weakness  Other Reaction(s): Not available  empagliflozin    Iohexol  Shortness Of Breath    Onset Date: [id number]  iohexol    Atorvastatin  Other (See Comments)    Myalgias    Atorvastatin  Calcium      Other Reaction(s): Not available  atorvastatin  calcium    Hydrocodone  Itching   Hydrocodone -Acetaminophen      Other Reaction(s): Not available   Morphine     Other Reaction(s): Not available   Morphine And Codeine Other (See Comments)    Lost control  Penicillins Other (See Comments)    Unknown reaction   Rosuvastatin  Other (See Comments)    Myalgias  Other Reaction(s): Not available  rosuvastatin     Immunization History  Administered Date(s) Administered   Fluad  Quad(high Dose 65+) 06/20/2021, 05/11/2022   Fluad  Trivalent(High Dose 65+) 05/12/2023   PNEUMOCOCCAL CONJUGATE-20 06/08/2023    Past Medical History:  Diagnosis Date   Anginal pain    Arthritis    CAD (coronary artery disease)    a. s/p MI with LAD and Diag stenting @ [name];  b. 06/2015 Cath: LAD 23m/d  ISR, 100 RCA (ISR) w/ L->R collats, otw mod nonobs dzs-->Med Rx; c. 08/2019 Cath: LM nl, LAD 10p/m ISR, D1 20, D2 100, RI min irregs, LCX 72m/d, OM1 100, OM2 50, RCA 100p, 70d. RPDA fills via collats from LAD. EF 25-35%-->Med Rx; d. 10/2019 NSTEMI/Cath: LCX now 100 (2.75x15 Resolute Onyx DES), otw stable compared to 08/2019.   Chronic combined systolic and diastolic CHF (congestive heart failure) (HCC)    a. 06/2015 Echo: EF 20-25%, Gr3 DD; b. 10/2019 Echo: EF 25-30%, glob HK, sev inf/infapical HK. Mod dil LA.   Dyspnea    Essential hypertension    Headache 12/23/2021   Hyperlipidemia    Hypokalemia    a. 06/2015 in setting of diuresis.   Ischemic cardiomyopathy    a. 2011 EF 45% ([name]);  b. 06/2015 Echo: EF 20-25%; c. 04/2019 Echo: EF 40-45%; d. 08/2019 LV gram: EF 25-35%; e. 10/2019 Echo: EF 25-30%.   PVC's (premature ventricular contractions)    a. 09/2019 Zio (3 days): Avg hr 70, 4 runs NSVT, 5 runs SVT, rare PACs, frequent PVCs w/ 11.8% burden.   Sleep apnea    Stroke [facility])    Stroke/Right temporal lobe infarction [facility])    a. 10/2019 MRI brain: 1cm acute ischemic nonhemorrhagic R temporal lobe infarct. Age-related cerebral atrophy w/ moderate chronic small vessel ischemic dzs.   Type 2 diabetes mellitus with hyperglycemia (HCC) 10/08/2019    Tobacco History: Social History   Tobacco Use  Smoking Status Former   Current packs/day: 0.00   Types: Cigarettes   Quit date: 06/10/2011   Years since quitting: 12.9  Smokeless Tobacco Never   Counseling given: Not Answered   Outpatient Medications Prior to Visit  Medication Sig Dispense Refill   albuterol  (VENTOLIN  HFA) 108 (90 Base) MCG/ACT inhaler Inhale 2 puffs into the lungs every 6 (six) hours as needed for wheezing or shortness of breath. 54 g 1   aspirin  EC 81 MG tablet Take 1 tablet (81 mg total) by mouth daily. Swallow whole.     clopidogrel  (PLAVIX ) 75 MG tablet Take 1 tablet (75 mg total) by mouth once daily. 90 tablet 3    ezetimibe  (ZETIA ) 10 MG tablet Take 1 tablet (10 mg total) by mouth daily. 90 tablet 3   furosemide  (LASIX ) 20 MG tablet Take 1 tablet (20 mg total) by mouth daily. 90 tablet 3   losartan  (COZAAR ) 25 MG tablet Take 1 tablet (25 mg total) by mouth daily. 90 tablet 3   metoprolol  succinate (TOPROL -XL) 25 MG 24 hr tablet Take 1 tablet (25 mg total) by mouth daily. Take with or immediately following a meal. 90 tablet 3   potassium chloride  (KLOR-CON  M) 10 MEQ tablet Take 1 tablet (10 mEq total) by mouth daily. 90 tablet 3   No facility-administered medications prior to visit.     Review of Systems: as above    Physical Exam:  BP 120/80  Pulse (!) 56   Temp 97.7 F (36.5 C)   Ht 5' 6 (1.676 m)   Wt 204 lb 6.4 oz (92.7 kg)   SpO2 93%   BMI 32.99 kg/m   GEN: Pleasant, interactive, well-kempt; obese; in no acute distress. HEENT:  Normocephalic and atraumatic. PERRLA. Sclera white. Nasal turbinates pink, moist and patent bilaterally. No rhinorrhea present. Oropharynx pink and moist, without exudate or edema. No lesions, ulcerations, or postnasal drip.  NECK:  Supple w/ fair ROM. No JVD present. No lymphadenopathy.   CV: RRR, no m/r/g, no peripheral edema. Pulses intact, +2 bilaterally. No cyanosis, pallor or clubbing. PULMONARY:  Unlabored, regular breathing. Diminished b/l A&P w/o wheezes/rales/rhonchi. No accessory muscle use.  GI: BS present and normoactive. Soft, non-tender to palpation. MSK: No erythema, warmth or tenderness. Cap refil <2 sec all extrem. Neuro: A/Ox3. No focal deficits noted.   Skin: Warm, no lesions or rashe Psych: Normal affect and behavior. Judgement and thought content appropriate.     Lab Results:  CBC    Component Value Date/Time   WBC 8.2 05/04/2024 0912   WBC 13.9 (H) 11/03/2023 0612   RBC 5.59 05/04/2024 0912   RBC 5.80 11/03/2023 0612   HGB 17.2 05/04/2024 0912   HCT 51.0 05/04/2024 0912   PLT 231 05/04/2024 0912   MCV 91 05/04/2024 0912    MCH 30.8 05/04/2024 0912   MCH 29.3 11/03/2023 0612   MCHC 33.7 05/04/2024 0912   MCHC 34.8 11/03/2023 0612   RDW 13.4 05/04/2024 0912   LYMPHSABS 1.5 09/28/2023 0424   MONOABS 1.2 (H) 09/28/2023 0424   EOSABS 0.3 09/28/2023 0424   BASOSABS 0.1 09/28/2023 0424    BMET    Component Value Date/Time   NA 140 05/09/2024 1452   K 3.8 05/09/2024 1452   CL 103 05/09/2024 1452   CO2 21 05/09/2024 1452   GLUCOSE 114 (H) 05/09/2024 1452   GLUCOSE 138 (H) 01/17/2024 1231   BUN 17 05/09/2024 1452   CREATININE 0.92 05/09/2024 1452   CALCIUM  9.4 05/09/2024 1452   GFRNONAA >60 01/17/2024 1231   GFRAA >60 10/17/2019 0943    BNP    Component Value Date/Time   BNP 127.0 (H) 05/04/2024 0912   BNP 381.3 (H) 01/17/2024 1231     Imaging:  No results found.  Administration History     None          Latest Ref Rng & Units 04/15/2022    1:46 PM  PFT Results  FVC-Pre L 2.89   FVC-Predicted Pre % 73   FVC-Post L 2.89   FVC-Predicted Post % 73   Pre FEV1/FVC % % 74   Post FEV1/FCV % % 78   FEV1-Pre L 2.15   FEV1-Predicted Pre % 72   FEV1-Post L 2.26   DLCO uncorrected ml/min/mmHg 10.63   DLCO UNC% % 45   DLVA Predicted % 58   TLC L 4.31   TLC % Predicted % 69   RV % Predicted % 75     No results found for: NITRICOXIDE      Assessment & Plan:   COPD (chronic obstructive pulmonary disease) (HCC) Mild AECOPD. Stable VS on room air and in no acute distress. Will start him on prednisone  burst and start him on maintenance therapy with Trelegy. Teachback performed. Side effect profile reviewed. No infectious symptoms so hold on antimicrobial therapy and imaging today. If symptoms fail to improve, will obtain CXR and labs for further evaluation. Suspect sinus  symptoms are contributing to DOE and cough as well. Action plan in place. Close follow up.  Patient Instructions  Continue Albuterol  inhaler 2 puffs or albuterol  nebulizer 3 mL every 6 hours as needed for shortness  of breath or wheezing Stop Stiolto. Start Trelegy 1 puff daily. Brush tongue and rinse mouth afterwards. This is your new maintenance inhaler that you will use daily, regardless of how you feel    Prednisone  40 mg daily for 5 days. Take in AM with food  Flonase  nasal spray 2 sprays each nostril daily for nasal congestion/drainage in your throat   Follow up in 3-4 weeks with Dr. Tamea or Katie Elizibeth Breau,NP. If symptoms do not improve or worsen, please contact office for sooner follow up or seek emergency care.    Allergic rhinitis Add on intranasal steroid. Advised to not use afrin more than 3-5 days consecutively.     I spent 35 minutes of dedicated to the care of this patient on the date of this encounter to include pre-visit review of records, face-to-face time with the patient discussing conditions above, post visit ordering of testing, clinical documentation with the electronic health record, making appropriate referrals as documented, and communicating necessary findings to members of the patients care team.  Comer LULLA Rouleau, NP 05/28/2024  Pt aware and understands NP's role.

## 2024-05-28 NOTE — Assessment & Plan Note (Addendum)
 Mild AECOPD. Stable VS on room air and in no acute distress. Will start him on prednisone  burst and start him on maintenance therapy with Trelegy. Teachback performed. Side effect profile reviewed. No infectious symptoms so hold on antimicrobial therapy and imaging today. If symptoms fail to improve, will obtain CXR and labs for further evaluation. Suspect sinus symptoms are contributing to DOE and cough as well. Ordered neb machine and solution. Action plan in place. Close follow up.  Patient Instructions  Continue Albuterol  inhaler 2 puffs or albuterol  nebulizer 3 mL every 6 hours as needed for shortness of breath or wheezing Start Trelegy 1 puff daily. Brush tongue and rinse mouth afterwards. This is your new maintenance inhaler that you will use daily, regardless of how you feel    Prednisone  40 mg daily for 5 days. Take in AM with food  Flonase  nasal spray 2 sprays each nostril daily for nasal congestion/drainage in your throat   Follow up in 3-4 weeks with Dr. Tamea or Katie Kyann Heydt,NP. If symptoms do not improve or worsen, please contact office for sooner follow up or seek emergency care.

## 2024-05-28 NOTE — Patient Instructions (Addendum)
 Continue Albuterol  inhaler 2 puffs or albuterol  nebulizer 3 mL every 6 hours as needed for shortness of breath or wheezing Start Trelegy 1 puff daily. Brush tongue and rinse mouth afterwards. This is your new maintenance inhaler that you will use daily, regardless of how you feel    Prednisone  40 mg daily for 5 days. Take in AM with food  Flonase  nasal spray 2 sprays each nostril daily for nasal congestion/drainage in your throat   Follow up in 3-4 weeks with Dr. Tamea or Katie Yaniel Limbaugh,NP. If symptoms do not improve or worsen, please contact office for sooner follow up or seek emergency care.

## 2024-05-28 NOTE — Assessment & Plan Note (Signed)
 Add on intranasal steroid. Advised to not use afrin more than 3-5 days consecutively.

## 2024-05-29 ENCOUNTER — Telehealth: Payer: Self-pay

## 2024-05-29 ENCOUNTER — Other Ambulatory Visit (HOSPITAL_COMMUNITY): Payer: Self-pay

## 2024-05-29 NOTE — Telephone Encounter (Signed)
 LVMTCB -- insurance denied Trelegy and will have to be started on two inhalers as alternative medication -- Symbicort  and Spiriva  both of which will be covered by his current insurance.

## 2024-05-29 NOTE — Telephone Encounter (Signed)
 Prior auth needed for trelegy 100

## 2024-05-29 NOTE — Telephone Encounter (Signed)
 Has the patient tried and failed TWO formulary preferred medications? If only one preferred drug is available, then has the patient tried and failed the one preferred drug?   Brand Symbicort  Brand Advair Diksus Brand Advair HFA Dulera   *I see where patient was previously prescribed Dulera , but there are no records that patient filled or tried medication. Please clarify

## 2024-05-29 NOTE — Telephone Encounter (Signed)
 For similar efficacy he will need TWO inhalers.  We could do Symbicort  160/4.5, 2 inhalations twice a day and Spiriva  2.5 mcg, 2 puffs daily.

## 2024-05-29 NOTE — Telephone Encounter (Signed)
*  Pulm  Pharmacy Patient Advocate Encounter   Received notification from Pt Calls Messages that prior authorization for Trelegy 100mcg is required/requested.   Insurance verification completed.   The patient is insured through [employer] [facility] Medicaid.   Per test claim: PA required; PA started via CoverMyMeds. KEY [id number] . Waiting for clinical questions to populate.

## 2024-05-30 ENCOUNTER — Other Ambulatory Visit: Payer: Self-pay

## 2024-05-30 MED ORDER — SPIRIVA RESPIMAT 2.5 MCG/ACT IN AERS
2.0000 | INHALATION_SPRAY | Freq: Every day | RESPIRATORY_TRACT | 11 refills | Status: AC
  Filled 2024-05-30: qty 4, 30d supply, fill #0

## 2024-05-30 MED ORDER — BUDESONIDE-FORMOTEROL FUMARATE 160-4.5 MCG/ACT IN AERO
2.0000 | INHALATION_SPRAY | Freq: Two times a day (BID) | RESPIRATORY_TRACT | 12 refills | Status: AC
  Filled 2024-05-30: qty 10.2, 30d supply, fill #0
  Filled 2024-08-03: qty 10.2, 30d supply, fill #1

## 2024-05-30 NOTE — Telephone Encounter (Signed)
 I have notified the patient and sent in the prescriptions. Nothing further needed.

## 2024-06-05 ENCOUNTER — Ambulatory Visit: Payer: Medicaid Other

## 2024-06-05 DIAGNOSIS — I5022 Chronic systolic (congestive) heart failure: Secondary | ICD-10-CM

## 2024-06-06 LAB — CUP PACEART REMOTE DEVICE CHECK
Battery Remaining Longevity: 92 mo
Battery Voltage: 3.01 V
Brady Statistic AP VP Percent: 0.06 %
Brady Statistic AP VS Percent: 37.94 %
Brady Statistic AS VP Percent: 0.03 %
Brady Statistic AS VS Percent: 61.97 %
Brady Statistic RA Percent Paced: 36.76 %
Brady Statistic RV Percent Paced: 0.08 %
Date Time Interrogation Session: 20251202044223
HighPow Impedance: 67 Ohm
Implantable Lead Connection Status: 753985
Implantable Lead Connection Status: 753985
Implantable Lead Implant Date: 20231004
Implantable Lead Implant Date: 20231004
Implantable Lead Location: 753859
Implantable Lead Location: 753860
Implantable Lead Model: 5076
Implantable Pulse Generator Implant Date: 20231004
Lead Channel Impedance Value: 342 Ohm
Lead Channel Impedance Value: 399 Ohm
Lead Channel Impedance Value: 437 Ohm
Lead Channel Pacing Threshold Amplitude: 0.5 V
Lead Channel Pacing Threshold Amplitude: 0.75 V
Lead Channel Pacing Threshold Pulse Width: 0.4 ms
Lead Channel Pacing Threshold Pulse Width: 0.4 ms
Lead Channel Sensing Intrinsic Amplitude: 2.75 mV
Lead Channel Sensing Intrinsic Amplitude: 2.75 mV
Lead Channel Sensing Intrinsic Amplitude: 31 mV
Lead Channel Sensing Intrinsic Amplitude: 31 mV
Lead Channel Setting Pacing Amplitude: 1.5 V
Lead Channel Setting Pacing Amplitude: 1.5 V
Lead Channel Setting Pacing Pulse Width: 0.4 ms
Lead Channel Setting Sensing Sensitivity: 0.3 mV
Zone Setting Status: 755011

## 2024-06-08 NOTE — Progress Notes (Signed)
 Remote ICD Transmission

## 2024-06-14 NOTE — Progress Notes (Unsigned)
 Electrophysiology Clinic Note    Date:  06/15/2024  Patient ID:  [name]., DOB September 07, 1955, MRN [id number] PCP:  [name], MD  Cardiologist:  [name], MD  Electrophysiologist:  [name], MD  Electrophysiology APP:  [name], NP     Discussed the use of AI scribe software for clinical note transcription with the patient, who gave verbal consent to proceed.   Patient Profile    Chief Complaint: ICD follow-up  History of Present Illness: [name]. is a 68 y.o. male with PMH notable for HFrEF, VT s/p ICD, CAD s/p PCI, PVC, HTN, HLD, OSA, T2DM ; seen today for [name], MD for routine electrophysiology followup.   I last saw him 07/2023 at which time he had stopped all medications except Lasix  as needed.  He had several short VT episodes on his ICD and so was agreeable to restarting amiodarone .  He was fluid overload during exam and so was also agreeable to restarting low-dose spironolactone .  He has seen heart failure nurse practitioner, Dr. [name], and general cardiology in the interim, most recently 04/2024 where he again had stopped medications for about 3 months.  He complained of increased shortness of breath that started 2 weeks before appointment and was taking Lasix .  On follow-up today, he continues to have SOB, states that it's no worse than it usually us , and has had for past 3 years. He again has stopped some of his medicines as he is not sure which meds are for what condition.  He denies chest pain, chest pressure, increased edema. Has good appetite, able to lay flat to sleep.     Arrhythmia/Device History MDT dual chamber ICD, imp 04/2022; dx ICM, VT    AAD History: Amiodarone  - intermittently     ROS:  Please see the history of present illness. All other systems are reviewed and otherwise negative.    Physical Exam    VS:  BP (!) 156/76 (BP Location: Left Arm, Patient Position: Sitting, Cuff Size:  Normal)   Pulse 64   Ht 5' 6 (1.676 m)   Wt 198 lb (89.8 kg)   SpO2 96%   BMI 31.96 kg/m  BMI: Body mass index is 31.96 kg/m.           Wt Readings from Last 3 Encounters:  06/15/24 198 lb (89.8 kg)  05/28/24 204 lb 6.4 oz (92.7 kg)  05/04/24 201 lb 12.8 oz (91.5 kg)     GEN- The patient is well appearing, alert and oriented x 3 today.   Lungs- wheezing LLL, otherwise clear to ausculation bilaterally, normal work of breathing.  Heart- Regular rate and rhythm, no murmurs, rubs or gallops Extremities- No peripheral edema, warm, dry Skin-  device pocket well-healed, no tethering   Device interrogation done today and reviewed by myself:  Battery 7.7 years Lead thresholds, impedence, sensing stable  Very brief NSVT episodes, less than 3 seconds No changes made today   Studies Reviewed   Previous EP, cardiology notes.    EKG is ordered. Personal review of EKG from today shows:    EKG Interpretation Date/Time:  Friday June 15 2024 10:06:08 EST Ventricular Rate:  64 PR Interval:  190 QRS Duration:  118 QT Interval:  472 QTC Calculation: 486 R Axis:   99  Text Interpretation: Normal sinus rhythm Possible Left atrial enlargement Rightward axis Incomplete left bundle branch block Confirmed by Lynnette Pote [phone]) on 06/15/2024 12:58:03  PM    TTE, 11/01/2023  1. There is no left ventricular thrombus (Definity  contrast was used). Left ventricular ejection fraction, by estimation, is 30 to 35%. The left ventricle has moderately decreased function. The left ventricle demonstrates regional wall motion abnormalities (see scoring diagram/findings for description). The left ventricular internal cavity size was moderately dilated. Left ventricular diastolic parameters are consistent with Grade I diastolic dysfunction (impaired relaxation). There is akinesis of the left ventricular, basal-mid inferior wall and inferolateral wall. There is moderate hypokinesis of the left  ventricular, entire apical segment.   2. Right ventricular systolic function is mildly reduced. The right ventricular size is normal.   3. Left atrial size was mildly dilated.   4. Right atrial size was mildly dilated.   5. The mitral valve is normal in structure. Mild to moderate mitral valve regurgitation. No evidence of mitral stenosis.   6. The aortic valve is tricuspid. There is mild calcification of the aortic valve. Aortic valve regurgitation is not visualized. Aortic valve sclerosis/calcification is present, without any evidence of aortic stenosis.   Comparison(s): Prior images reviewed side by side. The left ventricular function is unchanged. The left ventricular diastolic function has improved. The left ventricular wall motion abnormalities are unchanged. Left heart filling pressures are no longer  elevated and mitral insufficiency appears less severe.   TTE, 02/03/2023  1. Left ventricular ejection fraction, by estimation, is 30 to 35%. Left ventricular ejection fraction by 2D MOD biplane is 31.4 %. The left ventricle has moderately decreased function. The left ventricle demonstrates moderate global hypokinesis with severe hypokinesis of the inferior, inferolateral and apical region. The left ventricular internal cavity size was moderately dilated. The average left ventricular global longitudinal strain is -6.8 %.   2. Right ventricular systolic function is normal. The right ventricular size is normal. There is mildly elevated pulmonary artery systolic pressure. The estimated right ventricular systolic pressure is 40.3 mmHg.   3. Left atrial size was severely dilated.   4. The mitral valve is normal in structure. Moderate mitral valve regurgitation. No evidence of mitral stenosis.   5. The aortic valve is tricuspid. There is mild calcification of the aortic valve. Aortic valve regurgitation is not visualized. Aortic valve sclerosis is present, with no evidence of aortic valve stenosis.   6.  There is borderline dilatation of the aortic root, measuring 39 mm.   7. The inferior vena cava is normal in size with greater than 50% respiratory variability, suggesting right atrial pressure of 3 mmHg.    RHC, 12/30/2021 Conclusions: Severe multivessel coronary artery disease, overall relatively similar to most recent cath in 2021.  Mid LCx stent placed at that time demonstrates mild in-stent restenosis in the proximal segment.  Eccentric ostial OM2 lesion appears similar. Mildly elevated left heart and pulmonary artery pressures. Mildly reduced Fick cardiac output/index.     Assessment and Plan     #) VT, ICM s/p ICD Device functioning well, see paceart for details Battery good Lead measurements stable Very brief NSVT episodes OptiVol low currently  #) HFrEF Most recent TTE with LVEF 30% OptiVol low currently, no increased edema or abd fullness Discussed the need to continue to take medications as prescribed He follows regularly with general cardiology team        Current medicines are reviewed at length with the patient today.   The patient does not have concerns regarding his medicines.  The following changes were made today:  none  Labs/ tests ordered today include:  Orders Placed This Encounter  Procedures   EKG 12-Lead     Disposition: Follow up with Dr. Kennyth or EP APP in 12 months   Signed, Adelia Baptista, NP  06/15/2024  1:01 PM  Electrophysiology [facility]

## 2024-06-15 ENCOUNTER — Encounter: Payer: Self-pay | Admitting: Cardiology

## 2024-06-15 ENCOUNTER — Ambulatory Visit: Admitting: Cardiology

## 2024-06-15 VITALS — BP 156/76 | HR 64 | Ht 66.0 in | Wt 198.0 lb

## 2024-06-15 DIAGNOSIS — I5022 Chronic systolic (congestive) heart failure: Secondary | ICD-10-CM | POA: Diagnosis not present

## 2024-06-15 DIAGNOSIS — Z95810 Presence of automatic (implantable) cardiac defibrillator: Secondary | ICD-10-CM | POA: Diagnosis not present

## 2024-06-15 DIAGNOSIS — I472 Ventricular tachycardia, unspecified: Secondary | ICD-10-CM | POA: Diagnosis not present

## 2024-06-15 DIAGNOSIS — I255 Ischemic cardiomyopathy: Secondary | ICD-10-CM

## 2024-06-15 LAB — CUP PACEART INCLINIC DEVICE CHECK
Date Time Interrogation Session: 20251212130140
Implantable Lead Connection Status: 753985
Implantable Lead Connection Status: 753985
Implantable Lead Implant Date: 20231004
Implantable Lead Implant Date: 20231004
Implantable Lead Location: 753859
Implantable Lead Location: 753860
Implantable Lead Model: 5076
Implantable Pulse Generator Implant Date: 20231004

## 2024-06-15 NOTE — Patient Instructions (Signed)
 Medication Instructions:  Your physician recommends that you continue on your current medications as directed. Please refer to the Current Medication list given to you today.  *If you need a refill on your cardiac medications before your next appointment, please call your pharmacy*  Lab Work: No labs ordered today    Testing/Procedures: No test ordered today   Follow-Up: At [facility], you and your health needs are our priority.  As part of our continuing mission to provide you with exceptional heart care, our providers are all part of one team.  This team includes your primary Cardiologist (physician) and Advanced Practice Providers or APPs (Physician Assistants and Nurse Practitioners) who all work together to provide you with the care you need, when you need it.  Your next appointment:   1 year(s)  Provider:   Suzann Riddle, NP or Dr. Kennyth

## 2024-06-19 ENCOUNTER — Ambulatory Visit: Admitting: Pulmonary Disease

## 2024-06-21 ENCOUNTER — Ambulatory Visit: Attending: Medical

## 2024-06-21 DIAGNOSIS — R0609 Other forms of dyspnea: Secondary | ICD-10-CM | POA: Insufficient documentation

## 2024-06-21 LAB — ECHOCARDIOGRAM COMPLETE
AR max vel: 2.41 cm2
AV Area VTI: 2.34 cm2
AV Area mean vel: 2.26 cm2
AV Mean grad: 2 mmHg
AV Peak grad: 3.2 mmHg
Ao pk vel: 0.89 m/s
Area-P 1/2: 4.4 cm2
Calc EF: 24.9 %
S' Lateral: 4.8 cm
Single Plane A2C EF: 24 %
Single Plane A4C EF: 25.1 %

## 2024-06-21 MED ADMIN — Perflutren Lipid Microsphere IV Susp 1.1 MG/ML: 2 mL | INTRAVENOUS | @ 09:00:00 | NDC 99999100031

## 2024-06-22 ENCOUNTER — Ambulatory Visit: Payer: Self-pay | Admitting: Cardiology

## 2024-06-29 ENCOUNTER — Ambulatory Visit: Attending: Medical | Admitting: Medical

## 2024-06-29 ENCOUNTER — Other Ambulatory Visit: Payer: Self-pay

## 2024-06-29 ENCOUNTER — Encounter: Payer: Self-pay | Admitting: Medical

## 2024-06-29 VITALS — BP 146/84 | HR 68 | Ht 66.0 in | Wt 200.6 lb

## 2024-06-29 DIAGNOSIS — I5022 Chronic systolic (congestive) heart failure: Secondary | ICD-10-CM | POA: Insufficient documentation

## 2024-06-29 DIAGNOSIS — I255 Ischemic cardiomyopathy: Secondary | ICD-10-CM | POA: Insufficient documentation

## 2024-06-29 DIAGNOSIS — I472 Ventricular tachycardia, unspecified: Secondary | ICD-10-CM | POA: Insufficient documentation

## 2024-06-29 DIAGNOSIS — G4733 Obstructive sleep apnea (adult) (pediatric): Secondary | ICD-10-CM | POA: Diagnosis present

## 2024-06-29 DIAGNOSIS — R0609 Other forms of dyspnea: Secondary | ICD-10-CM | POA: Diagnosis present

## 2024-06-29 DIAGNOSIS — Z95810 Presence of automatic (implantable) cardiac defibrillator: Secondary | ICD-10-CM | POA: Insufficient documentation

## 2024-06-29 DIAGNOSIS — I2510 Atherosclerotic heart disease of native coronary artery without angina pectoris: Secondary | ICD-10-CM | POA: Insufficient documentation

## 2024-06-29 DIAGNOSIS — I10 Essential (primary) hypertension: Secondary | ICD-10-CM | POA: Diagnosis present

## 2024-06-29 DIAGNOSIS — E782 Mixed hyperlipidemia: Secondary | ICD-10-CM | POA: Insufficient documentation

## 2024-06-29 MED ORDER — COLCHICINE 0.6 MG PO TABS
0.6000 mg | ORAL_TABLET | Freq: Every day | ORAL | 3 refills | Status: AC
  Filled 2024-06-29: qty 30, 30d supply, fill #0

## 2024-06-29 MED ORDER — LOSARTAN POTASSIUM 50 MG PO TABS
50.0000 mg | ORAL_TABLET | Freq: Every day | ORAL | 3 refills | Status: DC
  Filled 2024-06-29: qty 30, 30d supply, fill #0
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
## 2024-06-29 NOTE — Patient Instructions (Signed)
 Medication Instructions:  Your physician recommends the following medication changes.  INCREASE: Losartan  to 50 mg by mouth daily   *If you need a refill on your cardiac medications before your next appointment, please call your pharmacy*  Follow-Up: At [facility], you and your health needs are our priority.  As part of our continuing mission to provide you with exceptional heart care, our providers are all part of one team.  This team includes your primary Cardiologist (physician) and Advanced Practice Providers or APPs (Physician Assistants and Nurse Practitioners) who all work together to provide you with the care you need, when you need it.  Your next appointment:   3 month(s)  Provider:   Mikey Fishman, PA-C

## 2024-07-03 ENCOUNTER — Ambulatory Visit: Admitting: Neurology

## 2024-07-09 ENCOUNTER — Encounter: Payer: Self-pay | Admitting: Nurse Practitioner

## 2024-07-09 ENCOUNTER — Ambulatory Visit: Admitting: Nurse Practitioner

## 2024-07-09 ENCOUNTER — Other Ambulatory Visit: Payer: Self-pay

## 2024-07-09 VITALS — BP 148/90 | HR 75 | Temp 98.4°F | Ht 66.0 in | Wt 200.0 lb

## 2024-07-09 DIAGNOSIS — I5022 Chronic systolic (congestive) heart failure: Secondary | ICD-10-CM

## 2024-07-09 DIAGNOSIS — J309 Allergic rhinitis, unspecified: Secondary | ICD-10-CM

## 2024-07-09 DIAGNOSIS — Z87891 Personal history of nicotine dependence: Secondary | ICD-10-CM | POA: Diagnosis not present

## 2024-07-09 DIAGNOSIS — J449 Chronic obstructive pulmonary disease, unspecified: Secondary | ICD-10-CM | POA: Diagnosis not present

## 2024-07-09 NOTE — Assessment & Plan Note (Signed)
 Moderate COPD. Stable. Suspect high symptom burden primarily related to CHF given reduced EF. He is also not compliant with inhaler therapies, which we reviewed today. Verbalized understanding of importance with maintenance therapies. No change to current inhalers. Action plan in place. Close follow up.  Patient Instructions  Continue Albuterol  inhaler 2 puffs or albuterol  nebulizer 3 mL every 6 hours as needed for shortness of breath or wheezing Continue Flonase  nasal spray 2 sprays each nostril daily for nasal congestion/drainage in your throat   You have to be using Spiriva  2 puffs once a day and Symbicort  2 puffs Twice daily, every single day. Your albuterol  is the only thing you should be using as needed. These are meant to help control your COPD, not meant for rescue. Clean mouth well after Symbicort  use   Follow up with your heart doctor as scheduled A lot of your shortness of breath is coming from your heart failure and your heart not pumping how it should Notify your heart doctor of any swelling in your legs, or increased weight of 2-3 lb overnight or 5-7 lb in a week   Follow up in 4 months with Dr. Tamea or Katie Tranae Laramie,NP. If symptoms do not improve or worsen, please contact office for sooner follow up or seek emergency care.

## 2024-07-09 NOTE — Assessment & Plan Note (Signed)
 Decline in EF, now 25-30%, with GIIDD. Suspect primary driver of DOE. Awaiting eval for ICD. Euvolemic on exam. Follow up with cardiology as scheduled

## 2024-07-09 NOTE — Assessment & Plan Note (Signed)
 Improved with use of intranasal spray

## 2024-07-09 NOTE — Progress Notes (Signed)
 "  @Patient  ID: [name]., male    DOB: [birth year], 69 y.o.   MRN: [id number]  Chief Complaint  Patient presents with   COPD    SOB. Wheezing. No cough.  Albuterol -once day Spiriva - PRN. Not using Symbicort  or Albuterol  in the nebulizer.    Referring provider: [name], [name], MD  HPI: 69 year old male, former smoker followed for COPD and OSA on CPAP.  He is a patient of Dr. Tamea and last seen in office 05/28/2024.  Past medical history significant for CAD, ischemic cardiomyopathy status post ICD, CHF, hypertension, DM 2, HLD, obesity.  TEST/EVENTS:  04/15/2022 PFT: FVC 73, FEV1 72, ratio 78, TLC 69, DLCOunc 45  07/19/2022 NPSG: AHI 14.5, SpO2 low of 86% 02/03/2023 echo: EF 30-35%. Global hypokinesis. RV size and function nl. Mildly elevated PASP. LA severely dilated. Moderate MR.  06/21/2024 echo: EF 25-30%. GIIDD. RV size and function nl. Mild MR regurgitation   09/27/2023: OV with Dr. Tamea. COPD and CHF. Presents with SOB. Significant SOB, worsened last night. Used albuterol  which didn't alleviate symptoms. No CP. Ran out of his Stiolto 2 days ago. Does not monitor O2 at home. Has a cough with some phlegm; cream colored. Had cold chills last night and yesterday evening. No fever. Treated with duoneb and advised to go to the ED. Advised to resume Stiolto.   05/28/2024: Ov with Jamaya Sleeth NP [name]. is a 69 year old male with chronic obstructive pulmonary disease (COPD) who presents with worsening breathing over the last week. He has experienced worsening breathing over the past week without any recent illness or fever. He has a cough primarily to clear his throat, without producing any colored sputum, and notes a sensation of mucus in his throat causing a 'little bit of a wheeze.' No swelling in his legs is noted. No orthopnea, PND, palpitations, CP.  He has been using his albuterol  inhaler frequently, up to four times a day, including three times in the morning. He has not  used his Stiolto inhaler for some time and is currently not on a daily maintenance inhaler. He experiences some sinus congestion, with a 'little pressure' in his sinuses, and occasionally uses Afrin nasal spray two to three times a week at night. No sinus tenderness, sore throat, ear pain, colored drainage. He denies checking his oxygen levels at home.   07/09/2024: Today - follow up Discussed the use of AI scribe software for clinical note transcription with the patient, who gave verbal consent to proceed.  History of Present Illness  [name]. is a 69 year old male with chronic obstructive pulmonary disease (COPD) and heart failure who presents for follow-up of his respiratory symptoms.  He has chronic obstructive pulmonary disease (COPD) with ongoing breathing difficulties and wheezing. He is not using his Symbicort  inhaler. Only using his Spiriva  as needed. Initially, he was intended to be on Trelegy, but it was not covered by his insurance. His breathing issues are stable and not worse than his baseline, with no current cough, chest congestion, PND, CP, weight gain, orthopnea, hemoptysis or leg swelling. Using his albuterol  once a day.   He experiences nasal congestion primarily at night, around 2 or 3 AM, which he manages with a nasal spray. This regimen allows him to sleep until morning without further issues.  He has a history of heart failure. Recent echocardiogram showed reduced EF to 25-30%. He has been referred to EP for ICD. He sees them this  week.     Allergies  Allergen Reactions   Contrast Media [Iodinated Contrast Media] Shortness Of Breath   Empagliflozin  Other (See Comments)    Fatigue/weakness  Other Reaction(s): Not available  empagliflozin    Iohexol  Shortness Of Breath    Onset Date: [id number]  iohexol    Atorvastatin  Other (See Comments)    Myalgias    Atorvastatin  Calcium      Other Reaction(s): Not available  atorvastatin  calcium    Hydrocodone   Itching   Hydrocodone -Acetaminophen      Other Reaction(s): Not available   Morphine     Other Reaction(s): Not available   Morphine And Codeine Other (See Comments)    Lost control    Penicillins Other (See Comments)    Unknown reaction   Rosuvastatin  Other (See Comments)    Myalgias  Other Reaction(s): Not available  rosuvastatin     Immunization History  Administered Date(s) Administered   Fluad  Quad(high Dose 65+) 06/20/2021, 05/11/2022   Fluad  Trivalent(High Dose 65+) 05/12/2023   PNEUMOCOCCAL CONJUGATE-20 06/08/2023    Past Medical History:  Diagnosis Date   Anginal pain    Arthritis    CAD (coronary artery disease)    a. s/p MI with LAD and Diag stenting @ [name];  b. 06/2015 Cath: LAD 91m/d ISR, 100 RCA (ISR) w/ L->R collats, otw mod nonobs dzs-->Med Rx; c. 08/2019 Cath: LM nl, LAD 10p/m ISR, D1 20, D2 100, RI min irregs, LCX 33m/d, OM1 100, OM2 50, RCA 100p, 70d. RPDA fills via collats from LAD. EF 25-35%-->Med Rx; d. 10/2019 NSTEMI/Cath: LCX now 100 (2.75x15 Resolute Onyx DES), otw stable compared to 08/2019.   Chronic combined systolic and diastolic CHF (congestive heart failure) (HCC)    a. 06/2015 Echo: EF 20-25%, Gr3 DD; b. 10/2019 Echo: EF 25-30%, glob HK, sev inf/infapical HK. Mod dil LA.   Dyspnea    Essential hypertension    Headache 12/23/2021   Hyperlipidemia    Hypokalemia    a. 06/2015 in setting of diuresis.   Ischemic cardiomyopathy    a. 2011 EF 45% ([name]);  b. 06/2015 Echo: EF 20-25%; c. 04/2019 Echo: EF 40-45%; d. 08/2019 LV gram: EF 25-35%; e. 10/2019 Echo: EF 25-30%.   PVC's (premature ventricular contractions)    a. 09/2019 Zio (3 days): Avg hr 70, 4 runs NSVT, 5 runs SVT, rare PACs, frequent PVCs w/ 11.8% burden.   Sleep apnea    Stroke [facility])    Stroke/Right temporal lobe infarction [facility])    a. 10/2019 MRI brain: 1cm acute ischemic nonhemorrhagic R temporal lobe infarct. Age-related cerebral atrophy w/ moderate chronic small vessel ischemic dzs.   Type  2 diabetes mellitus with hyperglycemia (HCC) 10/08/2019    Tobacco History: Social History   Tobacco Use  Smoking Status Former   Current packs/day: 0.00   Types: Cigarettes   Quit date: 06/10/2011   Years since quitting: 13.0  Smokeless Tobacco Never   Counseling given: Not Answered   Outpatient Medications Prior to Visit  Medication Sig Dispense Refill   albuterol  (VENTOLIN  HFA) 108 (90 Base) MCG/ACT inhaler Inhale 2 puffs into the lungs every 6 (six) hours as needed for wheezing or shortness of breath. 54 g 1   aspirin  EC 81 MG tablet Take 1 tablet (81 mg total) by mouth daily. Swallow whole.     carvedilol  (COREG ) 6.25 MG tablet Take 6.25 mg by mouth 2 (two) times daily with a meal.     clopidogrel  (PLAVIX ) 75 MG tablet Take 1 tablet (75 mg total)  by mouth once daily. 90 tablet 3   colchicine  0.6 MG tablet Take 1 tablet (0.6 mg total) by mouth daily. 90 tablet 3   ezetimibe  (ZETIA ) 10 MG tablet Take 1 tablet (10 mg total) by mouth daily. 90 tablet 3   fluticasone  (FLONASE ) 50 MCG/ACT nasal spray Place 2 sprays into both nostrils daily. 16 g 2   furosemide  (LASIX ) 20 MG tablet Take 1 tablet (20 mg total) by mouth daily. 90 tablet 3   losartan  (COZAAR ) 50 MG tablet Take 1 tablet (50 mg total) by mouth daily. 90 tablet 3   metoprolol  succinate (TOPROL -XL) 25 MG 24 hr tablet Take 1 tablet (25 mg total) by mouth daily. Take with or immediately following a meal. 90 tablet 3   potassium chloride  (KLOR-CON  M) 10 MEQ tablet Take 1 tablet (10 mEq total) by mouth daily. 90 tablet 3   Tiotropium Bromide  (SPIRIVA  RESPIMAT) 2.5 MCG/ACT AERS Inhale 2 puffs into the lungs daily. 4 g 11   albuterol  (PROVENTIL ) (2.5 MG/3ML) 0.083% nebulizer solution Take 3 mLs (2.5 mg total) by nebulization every 6 (six) hours as needed. (Patient not taking: Reported on 07/09/2024) 75 mL 5   budesonide -formoterol  (SYMBICORT ) 160-4.5 MCG/ACT inhaler Inhale 2 puffs into the lungs in the morning and at bedtime. (Patient  not taking: Reported on 07/09/2024) 10.2 g 12   No facility-administered medications prior to visit.     Review of Systems: as above    Physical Exam:  BP (!) 148/90   Pulse 75   Temp 98.4 F (36.9 C)   Ht 5' 6 (1.676 m)   Wt 200 lb (90.7 kg)   SpO2 96%   BMI 32.28 kg/m   GEN: Pleasant, interactive, well-kempt; obese; in no acute distress. HEENT:  Normocephalic and atraumatic. PERRLA. Sclera white. Nasal turbinates pink, moist and patent bilaterally. No rhinorrhea present. Oropharynx pink and moist, without exudate or edema. No lesions, ulcerations, or postnasal drip.  NECK:  Supple w/ fair ROM. No JVD present. No lymphadenopathy.   CV: RRR, no m/r/g, no peripheral edema. Pulses intact, +2 bilaterally. No cyanosis, pallor or clubbing. PULMONARY:  Unlabored, regular breathing. Diminished b/l A&P w/o wheezes/rales/rhonchi. No accessory muscle use.  GI: BS present and normoactive. Soft, non-tender to palpation. MSK: No erythema, warmth or tenderness. Cap refil <2 sec all extrem. Neuro: A/Ox3. No focal deficits noted.   Skin: Warm, no lesions or rashe Psych: Normal affect and behavior. Judgement and thought content appropriate.     Lab Results:  CBC    Component Value Date/Time   WBC 8.2 05/04/2024 0912   WBC 13.9 (H) 11/03/2023 0612   RBC 5.59 05/04/2024 0912   RBC 5.80 11/03/2023 0612   HGB 17.2 05/04/2024 0912   HCT 51.0 05/04/2024 0912   PLT 231 05/04/2024 0912   MCV 91 05/04/2024 0912   MCH 30.8 05/04/2024 0912   MCH 29.3 11/03/2023 0612   MCHC 33.7 05/04/2024 0912   MCHC 34.8 11/03/2023 0612   RDW 13.4 05/04/2024 0912   LYMPHSABS 1.5 09/28/2023 0424   MONOABS 1.2 (H) 09/28/2023 0424   EOSABS 0.3 09/28/2023 0424   BASOSABS 0.1 09/28/2023 0424    BMET    Component Value Date/Time   NA 140 05/09/2024 1452   K 3.8 05/09/2024 1452   CL 103 05/09/2024 1452   CO2 21 05/09/2024 1452   GLUCOSE 114 (H) 05/09/2024 1452   GLUCOSE 138 (H) 01/17/2024 1231   BUN  17 05/09/2024 1452   CREATININE 0.92  05/09/2024 1452   CALCIUM  9.4 05/09/2024 1452   GFRNONAA >60 01/17/2024 1231   GFRAA >60 10/17/2019 0943    BNP    Component Value Date/Time   BNP 127.0 (H) 05/04/2024 0912   BNP 381.3 (H) 01/17/2024 1231     Imaging:  ECHOCARDIOGRAM COMPLETE Result Date: 06/21/2024    ECHOCARDIOGRAM REPORT   Patient Name:   [name]. Date of Exam: 06/21/2024 Medical Rec #:  [id number]         Height:       66.0 in Accession #:    [id number]        Weight:       198.0 lb Date of Birth:  1956/04/09          BSA:          1.991 m Patient Age:    68 years          BP:           156/76 mmHg Patient Gender: M                 HR:           90 bpm. Exam Location:  [place] Procedure: 2D Echo, Cardiac Doppler, Color Doppler and Intracardiac            Opacification Agent (Both Spectral and Color Flow Doppler were            utilized during procedure). Indications:    R06.9 DOE  History:        Patient has prior history of Echocardiogram examinations, most                 recent 11/01/2023. Cardiomyopathy and CHF, CAD and Previous                 Myocardial Infarction, Defibrillator, Stroke and COPD,                 Arrythmias:Tachycardia, Signs/Symptoms:Chest Pain, Shortness of                 Breath and Dizziness/Lightheadedness; Risk Factors:Hypertension,                 Former Smoker, Diabetes and Dyslipidemia.  Sonographer:    Gordon Miyamoto Referring Phys: [id number] CADENCE H FURTH  Sonographer Comments: Technically difficult study due to poor echo windows. Image acquisition challenging due to patient body habitus and Image acquisition challenging due to COPD. IMPRESSIONS  1. Left ventricular ejection fraction, by estimation, is 25 to 30%. Left ventricular ejection fraction by 2D MOD biplane is 24.9 %. The left ventricle has severely decreased function. The left ventricle demonstrates global hypokinesis. Left ventricular diastolic parameters are consistent with Grade II diastolic  dysfunction (pseudonormalization).  2. Right ventricular systolic function is normal. The right ventricular size is normal.  3. The mitral valve is normal in structure. Mild mitral valve regurgitation. No evidence of mitral stenosis.  4. The aortic valve is normal in structure. Aortic valve regurgitation is not visualized. No aortic stenosis is present.  5. The inferior vena cava is normal in size with greater than 50% respiratory variability, suggesting right atrial pressure of 3 mmHg. FINDINGS  Left Ventricle: Left ventricular ejection fraction, by estimation, is 25 to 30%. Left ventricular ejection fraction by 2D MOD biplane is 24.9 %. The left ventricle has severely decreased function. The left ventricle demonstrates global hypokinesis. Definity  contrast agent was given IV to delineate the left ventricular endocardial borders. Strain  was performed and the global longitudinal strain is indeterminate. The left ventricular internal cavity size was normal in size. There is no left ventricular hypertrophy. Left ventricular diastolic parameters are consistent with Grade II diastolic dysfunction (pseudonormalization). Right Ventricle: The right ventricular size is normal. No increase in right ventricular wall thickness. Right ventricular systolic function is normal. Left Atrium: Left atrial size was normal in size. Right Atrium: Right atrial size was normal in size. Pericardium: There is no evidence of pericardial effusion. Mitral Valve: The mitral valve is normal in structure. Mild mitral valve regurgitation. No evidence of mitral valve stenosis. Tricuspid Valve: The tricuspid valve is normal in structure. Tricuspid valve regurgitation is not demonstrated. No evidence of tricuspid stenosis. Aortic Valve: The aortic valve is normal in structure. Aortic valve regurgitation is not visualized. No aortic stenosis is present. Aortic valve mean gradient measures 2.0 mmHg. Aortic valve peak gradient measures 3.2 mmHg. Aortic  valve area, by VTI measures 2.34 cm. Pulmonic Valve: The pulmonic valve was normal in structure. Pulmonic valve regurgitation is not visualized. No evidence of pulmonic stenosis. Aorta: The aortic root is normal in size and structure. Venous: The inferior vena cava is normal in size with greater than 50% respiratory variability, suggesting right atrial pressure of 3 mmHg. IAS/Shunts: No atrial level shunt detected by color flow Doppler. Additional Comments: 3D was performed not requiring image post processing on an independent workstation and was indeterminate.  LEFT VENTRICLE PLAX 2D                        Biplane EF (MOD) LVIDd:         5.90 cm         LV Biplane EF:   Left LVIDs:         4.80 cm                          ventricular LV PW:         1.10 cm                          ejection LV IVS:        1.17 cm                          fraction by LVOT diam:     2.00 cm                          2D MOD LV SV:         45                               biplane is LV SV Index:   23                               24.9 %. LVOT Area:     3.14 cm                                Diastology  LV e' medial:    3.15 cm/s LV Volumes (MOD)               LV E/e' medial:  23.9 LV vol d, MOD    196.0 ml      LV e' lateral:   4.90 cm/s A2C:                           LV E/e' lateral: 15.4 LV vol d, MOD    191.0 ml A4C: LV vol s, MOD    149.0 ml A2C: LV vol s, MOD    143.0 ml A4C: LV SV MOD A2C:   47.0 ml LV SV MOD A4C:   191.0 ml LV SV MOD BP:    49.0 ml RIGHT VENTRICLE RV Basal diam:  3.10 cm RV Mid diam:    2.30 cm RV S prime:     10.00 cm/s TAPSE (M-mode): 1.3 cm LEFT ATRIUM             Index        RIGHT ATRIUM           Index LA diam:        4.20 cm 2.11 cm/m   RA Area:     15.80 cm LA Vol (A2C):   55.6 ml 27.92 ml/m  RA Volume:   38.20 ml  19.18 ml/m LA Vol (A4C):   50.3 ml 25.26 ml/m LA Biplane Vol: 53.9 ml 27.07 ml/m  AORTIC VALVE AV Area (Vmax):    2.41 cm AV Area (Vmean):   2.26 cm AV  Area (VTI):     2.34 cm AV Vmax:           88.90 cm/s AV Vmean:          58.700 cm/s AV VTI:            0.193 m AV Peak Grad:      3.2 mmHg AV Mean Grad:      2.0 mmHg LVOT Vmax:         68.30 cm/s LVOT Vmean:        42.300 cm/s LVOT VTI:          0.144 m LVOT/AV VTI ratio: 0.75  AORTA Ao Sinus diam: 3.20 cm Ao Asc diam:   3.20 cm MITRAL VALVE MV Area (PHT): 4.40 cm    SHUNTS MV Decel Time: 173 msec    Systemic VTI:  0.14 m MV E velocity: 75.30 cm/s  Systemic Diam: 2.00 cm MV A velocity: 59.00 cm/s MV E/A ratio:  1.28 Evalene Lunger MD Electronically signed by Evalene Lunger MD Signature Date/Time: 06/21/2024/6:41:22 PM    Final    CUP PACEART INCLINIC DEVICE CHECK Result Date: 06/15/2024 Normal in-clinic _dual__ chamber ICD check. Presenting Rhythm: _AS-VS__ . Routine testing was performed. Thresholds, sensing, and impedance demonstrate stable parameters and no programming changes needed. No treated arrhythmia. Estimated longevity __7+ years__ . Pt enrolled in remote follow-up. CANDIE Needle, NP   perflutren  lipid microspheres (DEFINITY ) IV suspension     Date Action Dose Route User   06/21/2024 [phone] Given 2 mL Intravenous Nguyen, Vy, RDMS          Latest Ref Rng & Units 04/15/2022    1:46 PM  PFT Results  FVC-Pre L 2.89   FVC-Predicted Pre % 73   FVC-Post L 2.89   FVC-Predicted Post % 73   Pre FEV1/FVC % % 74   Post FEV1/FCV % %  78   FEV1-Pre L 2.15   FEV1-Predicted Pre % 72   FEV1-Post L 2.26   DLCO uncorrected ml/min/mmHg 10.63   DLCO UNC% % 45   DLVA Predicted % 58   TLC L 4.31   TLC % Predicted % 69   RV % Predicted % 75     No results found for: NITRICOXIDE      Assessment & Plan:   COPD (chronic obstructive pulmonary disease) (HCC) Moderate COPD. Stable. Suspect high symptom burden primarily related to CHF given reduced EF. He is also not compliant with inhaler therapies, which we reviewed today. Verbalized understanding of importance with maintenance therapies.  No change to current inhalers. Action plan in place. Close follow up.  Patient Instructions  Continue Albuterol  inhaler 2 puffs or albuterol  nebulizer 3 mL every 6 hours as needed for shortness of breath or wheezing Continue Flonase  nasal spray 2 sprays each nostril daily for nasal congestion/drainage in your throat   You have to be using Spiriva  2 puffs once a day and Symbicort  2 puffs Twice daily, every single day. Your albuterol  is the only thing you should be using as needed. These are meant to help control your COPD, not meant for rescue. Clean mouth well after Symbicort  use   Follow up with your heart doctor as scheduled A lot of your shortness of breath is coming from your heart failure and your heart not pumping how it should Notify your heart doctor of any swelling in your legs, or increased weight of 2-3 lb overnight or 5-7 lb in a week   Follow up in 4 months with Dr. Tamea or Katie Sevyn Markham,NP. If symptoms do not improve or worsen, please contact office for sooner follow up or seek emergency care.    Allergic rhinitis Improved with use of intranasal spray   Chronic HFrEF (heart failure with reduced ejection fraction) (HCC) Decline in EF, now 25-30%, with GIIDD. Suspect primary driver of DOE. Awaiting eval for ICD. Euvolemic on exam. Follow up with cardiology as scheduled      I spent 35 minutes of dedicated to the care of this patient on the date of this encounter to include pre-visit review of records, face-to-face time with the patient discussing conditions above, post visit ordering of testing, clinical documentation with the electronic health record, making appropriate referrals as documented, and communicating necessary findings to members of the patients care team.  Comer LULLA Rouleau, NP 07/09/2024  Pt aware and understands NP's role.   "

## 2024-07-09 NOTE — Patient Instructions (Addendum)
 Continue Albuterol  inhaler 2 puffs or albuterol  nebulizer 3 mL every 6 hours as needed for shortness of breath or wheezing Continue Flonase  nasal spray 2 sprays each nostril daily for nasal congestion/drainage in your throat   You have to be using Spiriva  2 puffs once a day and Symbicort  2 puffs Twice daily, every single day. Your albuterol  is the only thing you should be using as needed. These are meant to help control your COPD, not meant for rescue. Clean mouth well after Symbicort  use   Follow up with your heart doctor as scheduled A lot of your shortness of breath is coming from your heart failure and your heart not pumping how it should Notify your heart doctor of any swelling in your legs, or increased weight of 2-3 lb overnight or 5-7 lb in a week   Follow up in 4 months with [name] or Glen Annalaura Sauseda,[name]. If symptoms do not improve or worsen, please contact office for sooner follow up or seek emergency care.

## 2024-07-13 ENCOUNTER — Ambulatory Visit: Admitting: Cardiology

## 2024-08-02 ENCOUNTER — Telehealth: Payer: Self-pay | Admitting: Family

## 2024-08-02 NOTE — Progress Notes (Signed)
 "  ADVANCED HF CLINIC NOTE  Referring Physician: [name], MD Primary Care: [name], [name], MD Primary Cardiologist: [name], MD HF Provider: formally Dr. Gardenia  Chief Complaint: fatigue   HPI:  [name]. is a 69 y.o. male with CAD, DM2, PVCs, former smoker, previous CVA, HTN, OSA, COPD, VT s/p ICD and chronic systolic HF.  He has known history of CAD s/p  remote MI with previous LAD,diagonal and RCA stenting at [facility]. Cath 2016 showed patent LAD and diagonal stents and chronically occluded RCA and left to right collaterals.  He also has chronic systolic heart failure due to ischemic cardiomyopathy EF 25-30%  Cath 2/21 with 3v CAD with patent stents in the LAD and diagonal without significant restenosis, chronically occluded RCA stents with right to right and left-to-right collaterals and significant stenosis in the distal left circumflex supplying a relatively small OM 3 distribution.  Ejection fraction was 25 to 30%. Right heart catheterization showed normal filling pressures, mild pulmonary hypertension and normal cardiac output.  Medical therapy was recommended.  Had NSTEMI 4/21 Cath showed occluded distal left circumflex which was treated successfully with PCI and drug-eluting stent placement.     He was admitted 06/23 with chest pain and heart failure.  Echo EF 30-35%. Cath showed patent LAD and LCx stents with chronically occluded RCA with L-to-R collaterals.  Medical therapy recommended. He did not fill his prescriptions. He returned to the ED after he was stung by multiple yellow jackets and was found to be in VT at 175 bpm. He was cardioverted with 120 J. He went back into VT and was treated successfully with IV amiodarone .     Admitted 07/23 with VT in the setting of not taking amiodarone  as prescribed. He ultimately underwent ICD placement by Dr. Cindie in October.  He was seen 12/23. He was not taking ntresto regularly because it dragged him down.  Stopped entresto  and added eplerenone . He stopped eplerenone  because it made him feel anxious and he couldn't sleep. Losartan  as well as sertraline  for anxiety was started.   Sleep study 1/24 with mild OSA (AHI 9)   Echo 02/03/23 EF 30-35% mod MR RV ok.   Had cut back on lasix  and was not taking regularly. Seen on 02/25/23 in Pulmonary office with 6-7 pound weight gain and increased SOB. CXR showed pulmonary edema. BNP elevated, Lasix  restarted at 80 daily (was supposed to be on 40 daily but only taking a couple times/week). Also treated with prednisone  and inhalers for possible COPD flare  Admitted 09/27/23 with shortness of breath due to HF exacerbation due to medication noncompliance. Initial BP 163/116. Chest x-ray with mild vascular congestion. Initially needed oxygen. Cardiology consulted. IV diuresed with transition to oral diuretics.   Was in the ED 10/30/23 with SOB and dizziness. Dizziness is worse w/ movement. Unable to get MRA/MRI (concern for stroke) due to ICD so transported to [name] 10/31/23. CT/MRI showed punctate acute infarct in the cerebellar vermis with moderate to severe chronic small vessel ischemic disease mild basilar artery stenosis. MRA otherwise unremarkable. Patient did not receive TNK. Echo 11/01/23: EF 30 to 35% left ventricle demonstrating regional wall motion abnormalities grade 1 diastolic dysfunction. CXR negative. Transferred to [name] inpatient rehab on 11/04/23. He called a family member to come pick him up and left AMA saying inpatient rehab was like a prison.   Seen in [facility] 07/25 where he says that he had stopped taking all his meds 2 months  prior. Agreed to resume losartan .   Was in the ER 08/25 where he was diagnosed with covid.   Echo 06/21/24: EF 25-30%, G2DD, normal RV, mild MR  Last seen by cardiology 12/25 where he was only taking his meds 2- 3 times/ week. Agreed to continue toprol , lasix / potassium and losartan  (which was increased).  He presents  today for a HF follow-up visit with a chief complaint of minimal fatigue. Occasional dizziness. Sleeping flat without any pillows. Appetite good. Denies any chest pain, palpitations, edema. Hasn't taken his meds in the ~ 2 days. Dog got into his medication bag and he's not sure what he's missing. The medications that he has with him are both carvedilol  and metoprolol  and he says that he's been taking both of them but only daily. Only taking furosemide  PRN and hasn't had to take that recently. Not adding any salt to his food.   ROS: All systems negative except what is listed in HPI, PMH and Problem List  Medtronic device interrogation: Optivol fluid< threshold, thoracic impedance at reference, AT/AF < 0.1 h/d, activity 1.1 h/ d, AP-VP < 0.1%, no VT   Past Medical History:  Diagnosis Date   Anginal pain    Arthritis    CAD (coronary artery disease)    a. s/p MI with LAD and Diag stenting @ [name];  b. 06/2015 Cath: LAD 64m/d ISR, 100 RCA (ISR) w/ L->R collats, otw mod nonobs dzs-->Med Rx; c. 08/2019 Cath: LM nl, LAD 10p/m ISR, D1 20, D2 100, RI min irregs, LCX 4m/d, OM1 100, OM2 50, RCA 100p, 70d. RPDA fills via collats from LAD. EF 25-35%-->Med Rx; d. 10/2019 NSTEMI/Cath: LCX now 100 (2.75x15 Resolute Onyx DES), otw stable compared to 08/2019.   Chronic combined systolic and diastolic CHF (congestive heart failure) (HCC)    a. 06/2015 Echo: EF 20-25%, Gr3 DD; b. 10/2019 Echo: EF 25-30%, glob HK, sev inf/infapical HK. Mod dil LA.   Dyspnea    Essential hypertension    Headache 12/23/2021   Hyperlipidemia    Hypokalemia    a. 06/2015 in setting of diuresis.   Ischemic cardiomyopathy    a. 2011 EF 45% ([name]);  b. 06/2015 Echo: EF 20-25%; c. 04/2019 Echo: EF 40-45%; d. 08/2019 LV gram: EF 25-35%; e. 10/2019 Echo: EF 25-30%.   PVC's (premature ventricular contractions)    a. 09/2019 Zio (3 days): Avg hr 70, 4 runs NSVT, 5 runs SVT, rare PACs, frequent PVCs w/ 11.8% burden.   Sleep apnea    Stroke [facility])     Stroke/Right temporal lobe infarction [facility])    a. 10/2019 MRI brain: 1cm acute ischemic nonhemorrhagic R temporal lobe infarct. Age-related cerebral atrophy w/ moderate chronic small vessel ischemic dzs.   Type 2 diabetes mellitus with hyperglycemia (HCC) 10/08/2019    Current Outpatient Medications  Medication Sig Dispense Refill   albuterol  (PROVENTIL ) (2.5 MG/3ML) 0.083% nebulizer solution Take 3 mLs (2.5 mg total) by nebulization every 6 (six) hours as needed. (Patient not taking: Reported on 07/09/2024) 75 mL 5   albuterol  (VENTOLIN  HFA) 108 (90 Base) MCG/ACT inhaler Inhale 2 puffs into the lungs every 6 (six) hours as needed for wheezing or shortness of breath. 54 g 1   aspirin  EC 81 MG tablet Take 1 tablet (81 mg total) by mouth daily. Swallow whole.     budesonide -formoterol  (SYMBICORT ) 160-4.5 MCG/ACT inhaler Inhale 2 puffs into the lungs in the morning and at bedtime. (Patient not taking: Reported on 07/09/2024) 10.2 g 12  carvedilol  (COREG ) 6.25 MG tablet Take 6.25 mg by mouth 2 (two) times daily with a meal.     clopidogrel  (PLAVIX ) 75 MG tablet Take 1 tablet (75 mg total) by mouth once daily. 90 tablet 3   colchicine  0.6 MG tablet Take 1 tablet (0.6 mg total) by mouth daily. 90 tablet 3   ezetimibe  (ZETIA ) 10 MG tablet Take 1 tablet (10 mg total) by mouth daily. 90 tablet 3   fluticasone  (FLONASE ) 50 MCG/ACT nasal spray Place 2 sprays into both nostrils daily. 16 g 2   furosemide  (LASIX ) 20 MG tablet Take 1 tablet (20 mg total) by mouth daily. 90 tablet 3   losartan  (COZAAR ) 50 MG tablet Take 1 tablet (50 mg total) by mouth daily. 90 tablet 3   metoprolol  succinate (TOPROL -XL) 25 MG 24 hr tablet Take 1 tablet (25 mg total) by mouth daily. Take with or immediately following a meal. 90 tablet 3   potassium chloride  (KLOR-CON  M) 10 MEQ tablet Take 1 tablet (10 mEq total) by mouth daily. 90 tablet 3   Tiotropium Bromide  (SPIRIVA  RESPIMAT) 2.5 MCG/ACT AERS Inhale 2 puffs into the lungs  daily. 4 g 11   No current facility-administered medications for this visit.    Allergies  Allergen Reactions   Contrast Media [Iodinated Contrast Media] Shortness Of Breath   Empagliflozin  Other (See Comments)    Fatigue/weakness  Other Reaction(s): Not available  empagliflozin    Iohexol  Shortness Of Breath    Onset Date: [id number]  iohexol    Atorvastatin  Other (See Comments)    Myalgias    Atorvastatin  Calcium      Other Reaction(s): Not available  atorvastatin  calcium    Hydrocodone  Itching   Hydrocodone -Acetaminophen      Other Reaction(s): Not available   Morphine     Other Reaction(s): Not available   Morphine And Codeine Other (See Comments)    Lost control    Penicillins Other (See Comments)    Unknown reaction   Rosuvastatin  Other (See Comments)    Myalgias  Other Reaction(s): Not available  rosuvastatin       Social History   Socioeconomic History   Marital status: Widowed    Spouse name: Not on file   Number of children: 2   Years of education: Not on file   Highest education level: GED or equivalent  Occupational History   Occupation: Statistician   Tobacco Use   Smoking status: Former    Current packs/day: 0.00    Types: Cigarettes    Quit date: 06/10/2011    Years since quitting: 13.1   Smokeless tobacco: Never  Vaping Use   Vaping status: Not on file  Substance and Sexual Activity   Alcohol use: No    Alcohol/week: 0.0 standard drinks of alcohol   Drug use: No   Sexual activity: Not Currently  Other Topics Concern   Not on file  Social History Narrative   Lives alone.   Social Drivers of Health   Tobacco Use: Medium Risk (07/09/2024)   Patient History    Smoking Tobacco Use: Former    Smokeless Tobacco Use: Never    Passive Exposure: Not on file  Financial Resource Strain: Medium Risk (09/29/2023)   Overall Financial Resource Strain (CARDIA)    Difficulty of Paying Living Expenses: Somewhat hard  Food Insecurity: No Food  Insecurity (10/31/2023)   Hunger Vital Sign    Worried About Running Out of Food in the Last Year: Never true    Ran Out of Food  in the Last Year: Never true  Transportation Needs: No Transportation Needs (10/31/2023)   PRAPARE - Administrator, Civil Service (Medical): No    Lack of Transportation (Non-Medical): No  Physical Activity: Not on file  Stress: Not on file  Social Connections: Moderately Isolated (10/31/2023)   Social Connection and Isolation Panel    Frequency of Communication with Friends and Family: More than three times a week    Frequency of Social Gatherings with Friends and Family: More than three times a week    Attends Religious Services: Never    Database Administrator or Organizations: Yes    Attends Banker Meetings: Never    Marital Status: Divorced  Catering Manager Violence: Not At Risk (10/31/2023)   Humiliation, Afraid, Rape, and Kick questionnaire    Fear of Current or Ex-Partner: No    Emotionally Abused: No    Physically Abused: No    Sexually Abused: No  Depression (PHQ2-9): High Risk (04/12/2024)   Depression (PHQ2-9)    PHQ-2 Score: 13  Alcohol Screen: Not on file  Housing: Low Risk (10/31/2023)   Housing Stability Vital Sign    Unable to Pay for Housing in the Last Year: No    Number of Times Moved in the Last Year: 1    Homeless in the Last Year: No  Utilities: At Risk (10/31/2023)   AHC Utilities    Threatened with loss of utilities: Yes  Health Literacy: Not on file      Family History  Problem Relation Age of Onset   Coronary artery disease Mother    Diabetes Mother    Coronary artery disease Father    Vitals:   08/03/24 0942  BP: 116/63  Pulse: 69  SpO2: 95%  Weight: 198 lb 6.4 oz (90 kg)   Wt Readings from Last 3 Encounters:  08/03/24 198 lb 6.4 oz (90 kg)  07/09/24 200 lb (90.7 kg)  06/29/24 200 lb 9.6 oz (91 kg)   Lab Results  Component Value Date   CREATININE 0.92 05/09/2024   CREATININE 0.97  05/04/2024   CREATININE 0.98 02/02/2024     PHYSICAL EXAM: General: Well appearing.  Cor: No JVD. Regular rhythm, rate.  Lungs: clear Abdomen: soft, nontender, nondistended. Extremities: no edema Neuro:. Affect pleasant   EKG: not done    ASSESSMENT & PLAN:  1.  Chronic systolic heart failure due to iCM - Echo 6/23 EF 30-35%.   - Echo 02/03/23 EF 30-35% mod MR RV ok.  - Echo 11/01/23: EF 30 to 35% left ventricle demonstrating regional wall motion abnormalities grade 1 diastolic dysfunction. - Echo 06/21/24: EF 25-30%, G2DD, normal RV, mild MR.  - s/p MDT ICD - Medtronic device interrogation: Optivol fluid< threshold, thoracic impedance at reference, AT/AF < 0.1 h/d, activity 1.1 h/ d, AP-VP < 0.1%, no VT - NYHA class II - euvolemic - stop carvedilol  and continue toprol  25mg  daily (he says that he won't take meds but daily) - Continue furosemide  PRN / potassium 10meq daily - Resume losartan  25mg  daily. Titrate as able. Took entresto  in the past and it dragged me down.  - Did not tolerate jardiance , had weakness / fatigue - BP may not tolerate MRA although stopping 1 of the beta blockers today will help.  - Will get BMET today and evaluate if daily potassium is needed or used PRN if he takes diuretic - Lengthy discussion about medication compliance and how we need to find the meds  that he is willing to take and consistently. He says that he's willing as long as he doesn't feel bad taking them.   2. CAD  - s/p remote MI. S/p PCI LAD, LCx and RCA - Cath 6/23: patent LAD and left circumflex stents with chronically occluded right coronary artery with left-to-right collaterals.  Medical therapy was recommended. - No s/s angina - saw cardiology 12/25 - continue plavix , ezetimibe   3  HTN:  - BP 116/63 - BMET 05/09/24 reviewed: sodium 140, potassium 3.8, creatinine 0.92 & GFR 91 - BMET today  4. H/o ventricular tachycardia: - Status post ICD placement.   - saw EP ([facility])  12/25 - Mg 11/10/23 was 2  5. H/o PVCs - Zio 3/21: Avg hr 70, 4 runs NSVT, 5 runs SVT, rare PACs, frequent PVCs w/ 11.8% burden.  6. Mild OSA - Sleep study 1/24 with mild OSA (AHI 9)  - saw pulmonology 01/26   Return in 2 weeks to see HF pharm for continued GDMT titration. Needs to then get established with new HF MD and follow up with APP after that.   I spent 50 minutes reviewing records, interviewing/ examing patient and managing plan/ orders.   Ellouise DELENA Class, FNP   08/03/24  "

## 2024-08-02 NOTE — Telephone Encounter (Signed)
 Called to confirm/remind patient of their appointment at the [facility] on 08/03/24.   Appointment:   [x] Confirmed  [] Left mess   [] No answer/No voice mail  [] VM Full/unable to leave message  [] Phone not in service  Patient reminded to bring all medications and/or complete list.  Confirmed patient has transportation. Gave directions, instructed to utilize valet parking.

## 2024-08-03 ENCOUNTER — Ambulatory Visit: Admitting: Family

## 2024-08-03 ENCOUNTER — Other Ambulatory Visit: Payer: Self-pay

## 2024-08-03 ENCOUNTER — Other Ambulatory Visit
Admission: RE | Admit: 2024-08-03 | Discharge: 2024-08-03 | Disposition: A | Source: Ambulatory Visit | Attending: Family | Admitting: Family

## 2024-08-03 ENCOUNTER — Encounter: Payer: Self-pay | Admitting: Family

## 2024-08-03 ENCOUNTER — Ambulatory Visit: Payer: Self-pay | Admitting: Family

## 2024-08-03 VITALS — BP 116/63 | HR 69 | Wt 198.4 lb

## 2024-08-03 DIAGNOSIS — I493 Ventricular premature depolarization: Secondary | ICD-10-CM | POA: Diagnosis not present

## 2024-08-03 DIAGNOSIS — Z5989 Other problems related to housing and economic circumstances: Secondary | ICD-10-CM | POA: Diagnosis not present

## 2024-08-03 DIAGNOSIS — J449 Chronic obstructive pulmonary disease, unspecified: Secondary | ICD-10-CM | POA: Insufficient documentation

## 2024-08-03 DIAGNOSIS — I472 Ventricular tachycardia, unspecified: Secondary | ICD-10-CM | POA: Diagnosis not present

## 2024-08-03 DIAGNOSIS — I2510 Atherosclerotic heart disease of native coronary artery without angina pectoris: Secondary | ICD-10-CM | POA: Insufficient documentation

## 2024-08-03 DIAGNOSIS — Z59868 Other specified financial insecurity: Secondary | ICD-10-CM | POA: Insufficient documentation

## 2024-08-03 DIAGNOSIS — I10 Essential (primary) hypertension: Secondary | ICD-10-CM

## 2024-08-03 DIAGNOSIS — E119 Type 2 diabetes mellitus without complications: Secondary | ICD-10-CM | POA: Insufficient documentation

## 2024-08-03 DIAGNOSIS — Z955 Presence of coronary angioplasty implant and graft: Secondary | ICD-10-CM | POA: Diagnosis not present

## 2024-08-03 DIAGNOSIS — I110 Hypertensive heart disease with heart failure: Secondary | ICD-10-CM | POA: Diagnosis present

## 2024-08-03 DIAGNOSIS — Z7952 Long term (current) use of systemic steroids: Secondary | ICD-10-CM | POA: Diagnosis not present

## 2024-08-03 DIAGNOSIS — G4733 Obstructive sleep apnea (adult) (pediatric): Secondary | ICD-10-CM | POA: Diagnosis not present

## 2024-08-03 DIAGNOSIS — Z7982 Long term (current) use of aspirin: Secondary | ICD-10-CM | POA: Insufficient documentation

## 2024-08-03 DIAGNOSIS — I252 Old myocardial infarction: Secondary | ICD-10-CM | POA: Diagnosis not present

## 2024-08-03 DIAGNOSIS — Z7951 Long term (current) use of inhaled steroids: Secondary | ICD-10-CM | POA: Diagnosis not present

## 2024-08-03 DIAGNOSIS — Z8673 Personal history of transient ischemic attack (TIA), and cerebral infarction without residual deficits: Secondary | ICD-10-CM | POA: Insufficient documentation

## 2024-08-03 DIAGNOSIS — I5022 Chronic systolic (congestive) heart failure: Secondary | ICD-10-CM

## 2024-08-03 DIAGNOSIS — Z7902 Long term (current) use of antithrombotics/antiplatelets: Secondary | ICD-10-CM | POA: Insufficient documentation

## 2024-08-03 DIAGNOSIS — Z95810 Presence of automatic (implantable) cardiac defibrillator: Secondary | ICD-10-CM | POA: Diagnosis not present

## 2024-08-03 DIAGNOSIS — I272 Pulmonary hypertension, unspecified: Secondary | ICD-10-CM | POA: Diagnosis not present

## 2024-08-03 DIAGNOSIS — I255 Ischemic cardiomyopathy: Secondary | ICD-10-CM | POA: Insufficient documentation

## 2024-08-03 DIAGNOSIS — F419 Anxiety disorder, unspecified: Secondary | ICD-10-CM | POA: Diagnosis not present

## 2024-08-03 DIAGNOSIS — Z79899 Other long term (current) drug therapy: Secondary | ICD-10-CM | POA: Insufficient documentation

## 2024-08-03 DIAGNOSIS — Z87891 Personal history of nicotine dependence: Secondary | ICD-10-CM | POA: Insufficient documentation

## 2024-08-03 LAB — BASIC METABOLIC PANEL WITH GFR
Anion gap: 12 (ref 5–15)
BUN: 12 mg/dL (ref 8–23)
CO2: 21 mmol/L — ABNORMAL LOW (ref 22–32)
Calcium: 9.1 mg/dL (ref 8.9–10.3)
Chloride: 103 mmol/L (ref 98–111)
Creatinine, Ser: 1.1 mg/dL (ref 0.61–1.24)
GFR, Estimated: >60 mL/min
Glucose, Bld: 216 mg/dL — ABNORMAL HIGH (ref 70–99)
Potassium: 4.3 mmol/L (ref 3.5–5.1)
Sodium: 136 mmol/L (ref 135–145)

## 2024-08-03 MED ORDER — LOSARTAN POTASSIUM 50 MG PO TABS
50.0000 mg | ORAL_TABLET | Freq: Every day | ORAL | 1 refills | Status: DC
  Filled 2024-08-03: qty 90, 90d supply, fill #0

## 2024-08-03 MED ORDER — POTASSIUM CHLORIDE CRYS ER 10 MEQ PO TBCR
10.0000 meq | EXTENDED_RELEASE_TABLET | Freq: Every day | ORAL | 1 refills | Status: AC
  Filled 2024-08-03: qty 90, 90d supply, fill #0

## 2024-08-03 MED ORDER — FUROSEMIDE 20 MG PO TABS
20.0000 mg | ORAL_TABLET | Freq: Every day | ORAL | 0 refills | Status: AC | PRN
  Filled 2024-08-03: qty 30, 30d supply, fill #0

## 2024-08-03 MED ORDER — LOSARTAN POTASSIUM 25 MG PO TABS
25.0000 mg | ORAL_TABLET | Freq: Every day | ORAL | 1 refills | Status: AC
  Filled 2024-08-03: qty 90, 90d supply, fill #0

## 2024-08-03 NOTE — Patient Instructions (Signed)
 Medication Changes:  STOP Carvedilol   RESTART Losartan  25mg  daily   Lab Work:  Go downstairs to [facility] on [address] to have your blood work completed.  We will only call you if the results are abnormal or if the provider would like to make medication changes.  No news is good news.   Follow-Up in: Please follow up with the [facility] in 2 weeks with Pharmacy.   Thank you for choosing [facility].    At the [facility], you and your health needs are our priority. We have a designated team specialized in the treatment of Heart Failure. This Care Team includes your primary Heart Failure Specialized Cardiologist (physician), Advanced Practice Providers (APPs- Physician Assistants and Nurse Practitioners), and Pharmacist who all work together to provide you with the care you need, when you need it.   You may see any of the following providers on your designated Care Team at your next follow up:  Dr. Toribio Fuel Dr. Ezra Shuck Dr. Ria Commander Dr. Morene Brownie Ellouise Class, FNP Jaun Bash, RPH-CPP  Please be sure to bring in all your medications bottles to every appointment.   Need to Contact Us :  If you have any questions or concerns before your next appointment please send us  a message through [place] or call our office at [phone].    TO LEAVE A MESSAGE FOR THE NURSE SELECT OPTION 2, PLEASE LEAVE A MESSAGE INCLUDING: YOUR NAME DATE OF BIRTH CALL BACK NUMBER REASON FOR CALL**this is important as we prioritize the call backs  YOU WILL RECEIVE A CALL BACK THE SAME DAY AS LONG AS YOU CALL BEFORE 4:00 PM

## 2024-08-16 ENCOUNTER — Ambulatory Visit

## 2024-09-04 ENCOUNTER — Ambulatory Visit

## 2024-10-03 ENCOUNTER — Ambulatory Visit: Admitting: Medical

## 2024-11-08 ENCOUNTER — Ambulatory Visit: Admitting: Pulmonary Disease

## 2024-12-04 ENCOUNTER — Ambulatory Visit

## 2025-03-05 ENCOUNTER — Ambulatory Visit

## 2025-06-04 ENCOUNTER — Ambulatory Visit

## 2025-09-03 ENCOUNTER — Ambulatory Visit

## 2026-01-15 ENCOUNTER — Ambulatory Visit: Payer: Medicaid Other
